# Patient Record
Sex: Female | Born: 1935 | Race: White | Hispanic: No | Smoking: Never smoker
Health system: Southern US, Community
[De-identification: ages and names within clinical notes are randomized; demographics above are authoritative.]

## PROBLEM LIST (undated history)

## (undated) DIAGNOSIS — Z5189 Encounter for other specified aftercare: Secondary | ICD-10-CM

## (undated) DIAGNOSIS — I10 Essential (primary) hypertension: Secondary | ICD-10-CM

## (undated) DIAGNOSIS — E039 Hypothyroidism, unspecified: Secondary | ICD-10-CM

## (undated) DIAGNOSIS — Z9229 Personal history of other drug therapy: Secondary | ICD-10-CM

## (undated) DIAGNOSIS — M1990 Unspecified osteoarthritis, unspecified site: Secondary | ICD-10-CM

## (undated) DIAGNOSIS — T7840XA Allergy, unspecified, initial encounter: Secondary | ICD-10-CM

## (undated) DIAGNOSIS — F32A Depression, unspecified: Secondary | ICD-10-CM

## (undated) DIAGNOSIS — F419 Anxiety disorder, unspecified: Secondary | ICD-10-CM

## (undated) DIAGNOSIS — E669 Obesity, unspecified: Secondary | ICD-10-CM

## (undated) DIAGNOSIS — E785 Hyperlipidemia, unspecified: Secondary | ICD-10-CM

## (undated) DIAGNOSIS — K8020 Calculus of gallbladder without cholecystitis without obstruction: Secondary | ICD-10-CM

## (undated) DIAGNOSIS — H269 Unspecified cataract: Secondary | ICD-10-CM

## (undated) DIAGNOSIS — I4891 Unspecified atrial fibrillation: Secondary | ICD-10-CM

## (undated) DIAGNOSIS — M8580 Other specified disorders of bone density and structure, unspecified site: Secondary | ICD-10-CM

## (undated) DIAGNOSIS — K5900 Constipation, unspecified: Secondary | ICD-10-CM

## (undated) DIAGNOSIS — C3490 Malignant neoplasm of unspecified part of unspecified bronchus or lung: Secondary | ICD-10-CM

## (undated) DIAGNOSIS — F329 Major depressive disorder, single episode, unspecified: Secondary | ICD-10-CM

## (undated) DIAGNOSIS — Z7901 Long term (current) use of anticoagulants: Secondary | ICD-10-CM

## (undated) HISTORY — DX: Hyperlipidemia, unspecified: E78.5

## (undated) HISTORY — DX: Allergy, unspecified, initial encounter: T78.40XA

## (undated) HISTORY — DX: Malignant neoplasm of unspecified part of unspecified bronchus or lung: C34.90

## (undated) HISTORY — DX: Hypothyroidism, unspecified: E03.9

## (undated) HISTORY — DX: Constipation, unspecified: K59.00

## (undated) HISTORY — DX: Obesity, unspecified: E66.9

## (undated) HISTORY — PX: COLONOSCOPY: SHX174

## (undated) HISTORY — DX: Major depressive disorder, single episode, unspecified: F32.9

## (undated) HISTORY — DX: Personal history of other drug therapy: Z92.29

## (undated) HISTORY — DX: Long term (current) use of anticoagulants: Z79.01

## (undated) HISTORY — PX: TUBAL LIGATION: SHX77

## (undated) HISTORY — DX: Essential (primary) hypertension: I10

## (undated) HISTORY — DX: Anxiety disorder, unspecified: F41.9

## (undated) HISTORY — DX: Depression, unspecified: F32.A

## (undated) HISTORY — DX: Unspecified cataract: H26.9

## (undated) HISTORY — DX: Unspecified osteoarthritis, unspecified site: M19.90

## (undated) HISTORY — PX: THYROIDECTOMY: SHX17

## (undated) HISTORY — DX: Encounter for other specified aftercare: Z51.89

## (undated) HISTORY — PX: CATARACT EXTRACTION: SUR2

## (undated) HISTORY — PX: UPPER GASTROINTESTINAL ENDOSCOPY: SHX188

## (undated) HISTORY — DX: Other specified disorders of bone density and structure, unspecified site: M85.80

## (undated) HISTORY — DX: Calculus of gallbladder without cholecystitis without obstruction: K80.20

## (undated) HISTORY — DX: Unspecified atrial fibrillation: I48.91

---

## 1996-02-18 HISTORY — PX: LUNG REMOVAL, PARTIAL: SHX233

## 1997-08-30 ENCOUNTER — Ambulatory Visit (HOSPITAL_COMMUNITY): Admission: RE | Admit: 1997-08-30 | Discharge: 1997-08-30 | Payer: Self-pay | Admitting: *Deleted

## 1997-10-17 ENCOUNTER — Encounter: Payer: Self-pay | Admitting: *Deleted

## 1997-10-18 ENCOUNTER — Encounter: Payer: Self-pay | Admitting: *Deleted

## 1997-10-18 ENCOUNTER — Inpatient Hospital Stay: Admission: RE | Admit: 1997-10-18 | Discharge: 1997-10-23 | Payer: Self-pay | Admitting: Thoracic Surgery

## 1997-10-21 ENCOUNTER — Encounter: Payer: Self-pay | Admitting: Thoracic Surgery

## 1997-10-22 ENCOUNTER — Encounter: Payer: Self-pay | Admitting: Thoracic Surgery

## 1997-10-23 ENCOUNTER — Encounter: Payer: Self-pay | Admitting: Vascular Surgery

## 1998-12-03 ENCOUNTER — Encounter: Admission: RE | Admit: 1998-12-03 | Discharge: 1998-12-03 | Payer: Self-pay | Admitting: Thoracic Surgery

## 1999-01-24 ENCOUNTER — Other Ambulatory Visit: Admission: RE | Admit: 1999-01-24 | Discharge: 1999-01-24 | Payer: Self-pay | Admitting: Family Medicine

## 1999-04-10 ENCOUNTER — Encounter: Payer: Self-pay | Admitting: Thoracic Surgery

## 1999-04-10 ENCOUNTER — Encounter: Admission: RE | Admit: 1999-04-10 | Discharge: 1999-04-10 | Payer: Self-pay | Admitting: Thoracic Surgery

## 1999-10-08 ENCOUNTER — Encounter: Admission: RE | Admit: 1999-10-08 | Discharge: 1999-10-08 | Payer: Self-pay | Admitting: Thoracic Surgery

## 1999-10-08 ENCOUNTER — Encounter: Payer: Self-pay | Admitting: Thoracic Surgery

## 2000-01-03 ENCOUNTER — Ambulatory Visit (HOSPITAL_COMMUNITY): Admission: RE | Admit: 2000-01-03 | Discharge: 2000-01-03 | Payer: Self-pay | Admitting: Thoracic Surgery

## 2000-01-03 ENCOUNTER — Encounter: Payer: Self-pay | Admitting: Thoracic Surgery

## 2000-05-01 ENCOUNTER — Ambulatory Visit (HOSPITAL_COMMUNITY): Admission: RE | Admit: 2000-05-01 | Discharge: 2000-05-01 | Payer: Self-pay | Admitting: Thoracic Surgery

## 2000-05-05 ENCOUNTER — Encounter: Payer: Self-pay | Admitting: Thoracic Surgery

## 2000-05-05 ENCOUNTER — Ambulatory Visit (HOSPITAL_COMMUNITY): Admission: RE | Admit: 2000-05-05 | Discharge: 2000-05-05 | Payer: Self-pay | Admitting: Thoracic Surgery

## 2000-11-11 ENCOUNTER — Ambulatory Visit (HOSPITAL_COMMUNITY): Admission: RE | Admit: 2000-11-11 | Discharge: 2000-11-11 | Payer: Self-pay | Admitting: Thoracic Surgery

## 2000-11-11 ENCOUNTER — Encounter: Payer: Self-pay | Admitting: Thoracic Surgery

## 2001-05-12 ENCOUNTER — Encounter: Payer: Self-pay | Admitting: Thoracic Surgery

## 2001-05-12 ENCOUNTER — Ambulatory Visit (HOSPITAL_COMMUNITY): Admission: RE | Admit: 2001-05-12 | Discharge: 2001-05-12 | Payer: Self-pay | Admitting: Thoracic Surgery

## 2001-11-10 ENCOUNTER — Encounter: Admission: RE | Admit: 2001-11-10 | Discharge: 2001-11-10 | Payer: Self-pay | Admitting: Thoracic Surgery

## 2001-11-10 ENCOUNTER — Encounter: Payer: Self-pay | Admitting: Thoracic Surgery

## 2002-08-09 ENCOUNTER — Other Ambulatory Visit: Admission: RE | Admit: 2002-08-09 | Discharge: 2002-08-09 | Payer: Self-pay | Admitting: Family Medicine

## 2002-11-15 ENCOUNTER — Encounter: Payer: Self-pay | Admitting: Thoracic Surgery

## 2002-11-15 ENCOUNTER — Encounter: Admission: RE | Admit: 2002-11-15 | Discharge: 2002-11-15 | Payer: Self-pay | Admitting: Thoracic Surgery

## 2003-11-09 ENCOUNTER — Other Ambulatory Visit: Admission: RE | Admit: 2003-11-09 | Discharge: 2003-11-09 | Payer: Self-pay | Admitting: Family Medicine

## 2003-11-23 ENCOUNTER — Encounter: Admission: RE | Admit: 2003-11-23 | Discharge: 2003-11-23 | Payer: Self-pay | Admitting: Family Medicine

## 2004-11-20 ENCOUNTER — Other Ambulatory Visit: Admission: RE | Admit: 2004-11-20 | Discharge: 2004-11-20 | Payer: Self-pay | Admitting: Family Medicine

## 2005-12-02 ENCOUNTER — Other Ambulatory Visit: Admission: RE | Admit: 2005-12-02 | Discharge: 2005-12-02 | Payer: Self-pay | Admitting: Family Medicine

## 2008-01-11 ENCOUNTER — Ambulatory Visit: Payer: Self-pay | Admitting: Gastroenterology

## 2008-01-18 LAB — HM COLONOSCOPY

## 2008-01-25 ENCOUNTER — Encounter: Payer: Self-pay | Admitting: Gastroenterology

## 2008-01-25 ENCOUNTER — Ambulatory Visit: Payer: Self-pay | Admitting: Gastroenterology

## 2008-01-27 ENCOUNTER — Encounter: Payer: Self-pay | Admitting: Gastroenterology

## 2009-03-14 LAB — HM DEXA SCAN

## 2010-01-22 LAB — HM PAP SMEAR: HM Pap smear: NEGATIVE

## 2010-05-01 LAB — HM DIABETES FOOT EXAM: HM Diabetic Foot Exam: NORMAL

## 2010-05-29 LAB — FECAL OCCULT BLOOD, GUAIAC: Fecal Occult Blood: NEGATIVE

## 2010-06-05 ENCOUNTER — Ambulatory Visit (INDEPENDENT_AMBULATORY_CARE_PROVIDER_SITE_OTHER): Payer: Medicare Other | Admitting: Cardiology

## 2010-06-05 ENCOUNTER — Encounter: Payer: Self-pay | Admitting: Cardiology

## 2010-06-05 VITALS — BP 148/79 | HR 63 | Ht 63.0 in | Wt 203.0 lb

## 2010-06-05 DIAGNOSIS — E669 Obesity, unspecified: Secondary | ICD-10-CM

## 2010-06-05 DIAGNOSIS — E119 Type 2 diabetes mellitus without complications: Secondary | ICD-10-CM

## 2010-06-05 DIAGNOSIS — Z6834 Body mass index (BMI) 34.0-34.9, adult: Secondary | ICD-10-CM | POA: Insufficient documentation

## 2010-06-05 DIAGNOSIS — R9431 Abnormal electrocardiogram [ECG] [EKG]: Secondary | ICD-10-CM

## 2010-06-05 DIAGNOSIS — I10 Essential (primary) hypertension: Secondary | ICD-10-CM | POA: Insufficient documentation

## 2010-06-05 DIAGNOSIS — E785 Hyperlipidemia, unspecified: Secondary | ICD-10-CM | POA: Insufficient documentation

## 2010-06-05 NOTE — Patient Instructions (Addendum)
You need to have a myoview stress test.  Please call our office once your have spoken with your daughter to schedule. You will called with the results You will need to follow as needed depending on the results

## 2010-06-05 NOTE — Assessment & Plan Note (Signed)
I reviewed her last lipid profile with an LDL in the 120s. This is certainly not cardiac but she has been started on treatment and I will defer her to her primary provider.

## 2010-06-05 NOTE — Progress Notes (Signed)
HPI The patient presents for evaluation of an abnormal EKG she was found to have some anterior T-wave inversions recently. This was new compared to previous. She has not had cardiovascular symptoms. She is not overly active. She will rarely get on a treadmill and she has mentioned some mild chest discomfort with this. She has not had any new shortness of breath, PND or. She has not had any new palpitations, presyncope or syncope. She says and chronic mild lower extremity swelling. She has not had any testing done in over 10 years when she apparently had a stress perfusion study.   Allergies  Allergen Reactions  . Piroxicam     REACTION: RASH TO HANDS    Current Outpatient Prescriptions  Medication Sig Dispense Refill  . amLODipine (NORVASC) 5 MG tablet Take 5 mg by mouth daily.        Marland Kitchen aspirin EC 81 MG EC tablet Take 81 mg by mouth daily.        Marland Kitchen BLACK COHOSH PO Take by mouth.        . ergocalciferol (VITAMIN D2) 50000 UNITS capsule Take 50,000 Units by mouth once a week.        . fish oil-omega-3 fatty acids 1000 MG capsule Take 2 g by mouth daily.        . furosemide (LASIX) 20 MG tablet Take 20 mg by mouth daily.        Marland Kitchen levothyroxine (SYNTHROID, LEVOTHROID) 137 MCG tablet Take 137 mcg by mouth daily.        . metFORMIN (GLUCOPHAGE) 500 MG tablet Take 500 mg by mouth 2 (two) times daily with a meal.        . metoprolol (TOPROL-XL) 200 MG 24 hr tablet 200 mg. Take 1 1/2 tab daily       . olmesartan (BENICAR) 40 MG tablet Take 40 mg by mouth daily.        . rosuvastatin (CRESTOR) 20 MG tablet Take 20 mg by mouth daily.        Marland Kitchen DISCONTD: bisacodyl (DULCOLAX) 5 MG EC tablet Take 5 mg by mouth daily as needed.        Marland Kitchen DISCONTD: metoCLOPramide (REGLAN) 10 MG tablet Take 10 mg by mouth 4 (four) times daily.        Marland Kitchen DISCONTD: polyethylene glycol (MIRALAX / GLYCOLAX) packet Take 17 g by mouth daily.          Past Medical History  Diagnosis Date  . HTN (hypertension)   . Diabetes  mellitus   . Abnormal EKG   . Dyslipidemia   . Obesity   . Lung cancer   . Hypothyroidism     Past Surgical History  Procedure Date  . Thyroidectomy   . Lung removal, partial     Right    Family History  Problem Relation Age of Onset  . Coronary artery disease Brother 93    History   Social History  . Marital Status: Widowed    Spouse Name: N/A    Number of Children: N/A  . Years of Education: N/A   Occupational History  . Not on file.   Social History Main Topics  . Smoking status: Never Smoker   . Smokeless tobacco: Not on file  . Alcohol Use: Not on file  . Drug Use: Not on file  . Sexually Active: Not on file   Other Topics Concern  . Not on file   Social History Narrative  . No narrative  on file   ROS: As stated in the HPI and negative for all other systems.  PHYSICAL EXAM BP 148/79  Pulse 63  Ht 5\' 3"  (1.6 m)  Wt 203 lb (92.08 kg)  BMI 35.96 kg/m2 GENERAL:  Well appearing HEENT:  Pupils equal round and reactive, fundi not visualized, oral mucosa unremarkable NECK:  No jugular venous distention, waveform within normal limits, carotid upstroke brisk and symmetric, no bruits, no thyromegaly LYMPHATICS:  No cervical, inguinal adenopathy LUNGS:  Clear to auscultation bilaterally BACK:  No CVA tenderness CHEST:  Unremarkable HEART:  PMI not displaced or sustained,S1 and S2 within normal limits, no S3, no S4, no clicks, no rubs, no murmurs ABD:  Flat, positive bowel sounds normal in frequency in pitch, no bruits, no rebound, no guarding, no midline pulsatile mass, no hepatomegaly, no splenomegaly, obese EXT:  2 plus pulses throughout, no edema, no cyanosis no clubbing SKIN:  No rashes no nodules NEURO:  Cranial nerves II through XII grossly intact, motor grossly intact throughout PSYCH:  Cognitively intact, oriented to person place and time  EKG:  Sinus rhythm, rate 70, premature atrial contractions, possible old anteroseptal infarct, anterior T-wave  inversion minus ischemia  ASSESSMENT AND PLAN

## 2010-06-05 NOTE — Assessment & Plan Note (Signed)
Her blood pressure is controlled. She will continue the meds as listed.

## 2010-06-05 NOTE — Assessment & Plan Note (Signed)
I do think the patient has high risk for cardiovascular disease given her abnormal EKG and risk factors. He had a stress perfusion imaging is indicated and I will arrange this. I believe she can walk on a treadmill.

## 2010-06-05 NOTE — Assessment & Plan Note (Signed)
The patient understands the need to lose weight with diet and exercise. We have discussed specific strategies for this.  

## 2010-06-06 LAB — HM MAMMOGRAPHY: HM Mammogram: NORMAL

## 2010-06-10 ENCOUNTER — Encounter: Payer: Self-pay | Admitting: Family Medicine

## 2010-07-01 ENCOUNTER — Ambulatory Visit (HOSPITAL_COMMUNITY): Payer: Medicare Other | Attending: Cardiology | Admitting: Radiology

## 2010-07-01 DIAGNOSIS — R0789 Other chest pain: Secondary | ICD-10-CM

## 2010-07-01 DIAGNOSIS — R0602 Shortness of breath: Secondary | ICD-10-CM

## 2010-07-01 DIAGNOSIS — R079 Chest pain, unspecified: Secondary | ICD-10-CM | POA: Insufficient documentation

## 2010-07-01 MED ORDER — TECHNETIUM TC 99M TETROFOSMIN IV KIT
11.0000 | PACK | Freq: Once | INTRAVENOUS | Status: AC | PRN
  Administered 2010-07-01: 11 via INTRAVENOUS

## 2010-07-01 MED ORDER — TECHNETIUM TC 99M TETROFOSMIN IV KIT
33.0000 | PACK | Freq: Once | INTRAVENOUS | Status: AC | PRN
  Administered 2010-07-01: 33 via INTRAVENOUS

## 2010-07-01 NOTE — Progress Notes (Addendum)
[facility] SITE 3 NUCLEAR MED [address] [place] [address] [phone]  Cardiology Nuclear Med Study  Veronica Paul is a 75 y.o. female [id number] [birth year]   Nuclear Med Background Indication for Stress Test:  Evaluation for Ischemia and Abnormal EKG History: >10 yrs Myocardial Perfusion Study Cardiac Risk Factors: Family History - CAD, Hypertension, Lipids and NIDDM  Symptoms:  Chest Pain, Chest Tightness (last date of chest discomfort ), DOE, Fatigue, Palpitations and SOB   Nuclear Pre-Procedure Caffeine/Decaff Intake:  None NPO After: 8:00pm   Lungs:  clear IV 0.9% NS with Angio Cath:  20g  IV Site: R Antecubital  IV Started by:  Stanton Kidney, EMT-P  Chest Size (in):  40 Cup Size: C  Height: 5\' 4"  (1.626 m)  Weight:  200 lb (90.719 kg)  BMI:  Body mass index is 34.33 kg/(m^2). Tech Comments:  Toprol held > 24 hours, per patient.    Nuclear Med Study 1 or 2 day study: 1 day  Stress Test Type:  Eugenie Birks  Reading MD: Dietrich Pates, MD  Order Authorizing Provider:  J.Hochrein  Resting Radionuclide: Technetium 72m Tetrofosmin  Resting Radionuclide Dose: 11.0 mCi   Stress Radionuclide:  Technetium 28m Tetrofosmin  Stress Radionuclide Dose: 33.0 mCi           Stress Protocol Rest HR: 69 Stress HR: 133  Rest BP: 126/67 Stress BP: 220/100  Exercise Time (min): 2:59 METS: 4.6   Predicted Max HR: 148 bpm % Max HR: 89.86 bpm Rate Pressure Product: 21308   Dose of Adenosine (mg):  n/a Dose of Lexiscan: n/a mg  Dose of Atropine (mg): n/a Dose of Dobutamine: n/a mcg/kg/min (at max HR)  Stress Test Technologist: Milana Na, EMT-P  Nuclear Technologist:  Domenic Polite, CNMT     Rest Procedure:  Myocardial perfusion imaging was performed at rest 45 minutes following the intravenous administration of Technetium 23m Tetrofosmin. Rest ECG: NSR  Stress Procedure:  The patient exercised for 2:59.  The patient stopped due to sob  and denied any  chest pain.  There were no significant ST-T wave changes and occ pvcs/pacs.  Technetium 55m Tetrofosmin was injected at peak exercise and myocardial perfusion imaging was performed after a brief delay. Stress ECG: No significant ST segment change suggestive of ischemia.  QPS Raw Data Images:  Normal; no motion artifact; normal heart/lung ratio. Stress Images:  Normal homogeneous uptake in all areas of the myocardium. Rest Images:  Normal homogeneous uptake in all areas of the myocardium. Subtraction (SDS):  No evidence of ischemia. Transient Ischemic Dilatation (Normal <1.22):  0.87 Lung/Heart Ratio (Normal <0.45):  0.29  Quantitative Gated Spect Images QGS EDV:  64 ml QGS ESV:  14 ml QGS cine images:  NL LV Function; NL Wall Motion QGS EF: 77%  Impression Exercise Capacity:  Poor exercise capacity. BP Response:  Hypertensive blood pressure response. Clinical Symptoms:  No chest pain. ECG Impression:  No significant ST segment change suggestive of ischemia. Comparison with Prior Nuclear Study: No old to compare.  Overall Impression:  Normal perfusion.  No ischemia or scar.  No evidence of ischemia or infarct.   No further workup.  Continue with risk reduction.  Rollene Rotunda

## 2010-07-02 NOTE — Progress Notes (Signed)
NUCLEAR STUDY ROUTED TO DR.HOCHREIN.

## 2010-07-08 NOTE — Progress Notes (Signed)
Pt aware of results and denied questions

## 2010-11-22 LAB — GLUCOSE, CAPILLARY
Glucose-Capillary: 110 mg/dL — ABNORMAL HIGH (ref 70–99)
Glucose-Capillary: 125 mg/dL — ABNORMAL HIGH (ref 70–99)

## 2011-03-11 DIAGNOSIS — R059 Cough, unspecified: Secondary | ICD-10-CM | POA: Diagnosis not present

## 2011-03-11 DIAGNOSIS — J029 Acute pharyngitis, unspecified: Secondary | ICD-10-CM | POA: Diagnosis not present

## 2011-03-11 DIAGNOSIS — J019 Acute sinusitis, unspecified: Secondary | ICD-10-CM | POA: Diagnosis not present

## 2011-03-11 DIAGNOSIS — R05 Cough: Secondary | ICD-10-CM | POA: Diagnosis not present

## 2011-03-11 DIAGNOSIS — R070 Pain in throat: Secondary | ICD-10-CM | POA: Diagnosis not present

## 2011-03-11 DIAGNOSIS — J020 Streptococcal pharyngitis: Secondary | ICD-10-CM | POA: Diagnosis not present

## 2011-05-05 DIAGNOSIS — R609 Edema, unspecified: Secondary | ICD-10-CM | POA: Diagnosis not present

## 2011-05-05 DIAGNOSIS — E785 Hyperlipidemia, unspecified: Secondary | ICD-10-CM | POA: Diagnosis not present

## 2011-05-05 DIAGNOSIS — E119 Type 2 diabetes mellitus without complications: Secondary | ICD-10-CM | POA: Diagnosis not present

## 2011-05-05 DIAGNOSIS — I10 Essential (primary) hypertension: Secondary | ICD-10-CM | POA: Diagnosis not present

## 2011-05-05 DIAGNOSIS — E559 Vitamin D deficiency, unspecified: Secondary | ICD-10-CM | POA: Diagnosis not present

## 2011-06-12 DIAGNOSIS — Z1231 Encounter for screening mammogram for malignant neoplasm of breast: Secondary | ICD-10-CM | POA: Diagnosis not present

## 2011-06-12 LAB — HM MAMMOGRAPHY: HM Mammogram: NORMAL

## 2011-06-18 DIAGNOSIS — M899 Disorder of bone, unspecified: Secondary | ICD-10-CM | POA: Diagnosis not present

## 2011-06-18 DIAGNOSIS — M949 Disorder of cartilage, unspecified: Secondary | ICD-10-CM | POA: Diagnosis not present

## 2011-06-18 LAB — HM DEXA SCAN

## 2011-08-08 DIAGNOSIS — E785 Hyperlipidemia, unspecified: Secondary | ICD-10-CM | POA: Diagnosis not present

## 2011-08-08 DIAGNOSIS — E559 Vitamin D deficiency, unspecified: Secondary | ICD-10-CM | POA: Diagnosis not present

## 2011-08-08 DIAGNOSIS — I10 Essential (primary) hypertension: Secondary | ICD-10-CM | POA: Diagnosis not present

## 2011-08-08 DIAGNOSIS — E119 Type 2 diabetes mellitus without complications: Secondary | ICD-10-CM | POA: Diagnosis not present

## 2011-08-10 LAB — HM DIABETES FOOT EXAM: HM Diabetic Foot Exam: NORMAL

## 2011-08-13 DIAGNOSIS — Z1212 Encounter for screening for malignant neoplasm of rectum: Secondary | ICD-10-CM | POA: Diagnosis not present

## 2011-08-13 DIAGNOSIS — Z0000 Encounter for general adult medical examination without abnormal findings: Secondary | ICD-10-CM | POA: Diagnosis not present

## 2011-08-14 LAB — FECAL OCCULT BLOOD, GUAIAC: Fecal Occult Blood: NEGATIVE

## 2011-10-23 DIAGNOSIS — E119 Type 2 diabetes mellitus without complications: Secondary | ICD-10-CM | POA: Diagnosis not present

## 2011-10-23 DIAGNOSIS — H40039 Anatomical narrow angle, unspecified eye: Secondary | ICD-10-CM | POA: Diagnosis not present

## 2011-10-23 LAB — HM DIABETES EYE EXAM

## 2011-11-07 DIAGNOSIS — E785 Hyperlipidemia, unspecified: Secondary | ICD-10-CM | POA: Diagnosis not present

## 2011-11-07 DIAGNOSIS — E119 Type 2 diabetes mellitus without complications: Secondary | ICD-10-CM | POA: Diagnosis not present

## 2011-11-07 DIAGNOSIS — E039 Hypothyroidism, unspecified: Secondary | ICD-10-CM | POA: Diagnosis not present

## 2011-11-07 DIAGNOSIS — Z23 Encounter for immunization: Secondary | ICD-10-CM | POA: Diagnosis not present

## 2011-11-07 DIAGNOSIS — R609 Edema, unspecified: Secondary | ICD-10-CM | POA: Diagnosis not present

## 2011-11-07 DIAGNOSIS — I10 Essential (primary) hypertension: Secondary | ICD-10-CM | POA: Diagnosis not present

## 2012-02-09 DIAGNOSIS — E785 Hyperlipidemia, unspecified: Secondary | ICD-10-CM | POA: Diagnosis not present

## 2012-02-09 DIAGNOSIS — E039 Hypothyroidism, unspecified: Secondary | ICD-10-CM | POA: Diagnosis not present

## 2012-02-09 DIAGNOSIS — E119 Type 2 diabetes mellitus without complications: Secondary | ICD-10-CM | POA: Diagnosis not present

## 2012-02-09 DIAGNOSIS — R609 Edema, unspecified: Secondary | ICD-10-CM | POA: Diagnosis not present

## 2012-02-09 DIAGNOSIS — I10 Essential (primary) hypertension: Secondary | ICD-10-CM | POA: Diagnosis not present

## 2012-02-09 LAB — HEMOGLOBIN A1C: Hgb A1c MFr Bld: 5.5 % (ref 4.0–6.0)

## 2012-02-10 LAB — HEPATIC FUNCTION PANEL
ALT: 21 U/L (ref 7–35)
AST: 21 U/L (ref 13–35)
Alkaline Phosphatase: 58 U/L (ref 25–125)

## 2012-02-10 LAB — LIPID PANEL
Cholesterol: 166 mg/dL (ref 0–200)
HDL: 73 mg/dL — AB (ref 35–70)
LDL Cholesterol: 64 mg/dL
Triglycerides: 146 mg/dL (ref 40–160)

## 2012-02-10 LAB — BASIC METABOLIC PANEL
BUN: 9 mg/dL (ref 4–21)
Creatinine: 0.9 mg/dL (ref 0.5–1.1)
Glucose: 118 mg/dL
Potassium: 3.5 mmol/L (ref 3.4–5.3)
Sodium: 142 mmol/L (ref 137–147)

## 2012-04-24 ENCOUNTER — Encounter: Payer: Self-pay | Admitting: *Deleted

## 2012-04-24 DIAGNOSIS — M8588 Other specified disorders of bone density and structure, other site: Secondary | ICD-10-CM | POA: Insufficient documentation

## 2012-04-24 DIAGNOSIS — M8580 Other specified disorders of bone density and structure, unspecified site: Secondary | ICD-10-CM | POA: Insufficient documentation

## 2012-04-24 LAB — HM DEXA SCAN

## 2012-05-07 ENCOUNTER — Ambulatory Visit: Payer: Medicare Other

## 2012-05-07 ENCOUNTER — Ambulatory Visit (INDEPENDENT_AMBULATORY_CARE_PROVIDER_SITE_OTHER): Payer: Medicare Other | Admitting: Nurse Practitioner

## 2012-05-07 VITALS — BP 138/62 | HR 68 | Temp 98.6°F | Wt 202.0 lb

## 2012-05-07 DIAGNOSIS — M79609 Pain in unspecified limb: Secondary | ICD-10-CM

## 2012-05-07 DIAGNOSIS — M79672 Pain in left foot: Secondary | ICD-10-CM

## 2012-05-07 DIAGNOSIS — E039 Hypothyroidism, unspecified: Secondary | ICD-10-CM

## 2012-05-07 DIAGNOSIS — E785 Hyperlipidemia, unspecified: Secondary | ICD-10-CM

## 2012-05-07 LAB — COMPLETE METABOLIC PANEL WITH GFR
ALT: 13 U/L (ref 0–35)
AST: 12 U/L (ref 0–37)
Albumin: 4.5 g/dL (ref 3.5–5.2)
Alkaline Phosphatase: 57 U/L (ref 39–117)
BUN: 19 mg/dL (ref 6–23)
CO2: 28 mEq/L (ref 19–32)
Calcium: 9.5 mg/dL (ref 8.4–10.5)
Chloride: 101 mEq/L (ref 96–112)
Creat: 0.79 mg/dL (ref 0.50–1.10)
GFR, Est African American: 85 mL/min
GFR, Est Non African American: 74 mL/min
Glucose, Bld: 106 mg/dL — ABNORMAL HIGH (ref 70–99)
Potassium: 4.6 mEq/L (ref 3.5–5.3)
Sodium: 138 mEq/L (ref 135–145)
Total Bilirubin: 0.4 mg/dL (ref 0.3–1.2)
Total Protein: 6.9 g/dL (ref 6.0–8.3)

## 2012-05-07 LAB — THYROID PANEL WITH TSH
Free Thyroxine Index: 4.8 — ABNORMAL HIGH (ref 1.0–3.9)
T3 Uptake: 35.7 % (ref 22.5–37.0)
T4, Total: 13.5 ug/dL — ABNORMAL HIGH (ref 5.0–12.5)
TSH: 0.084 u[IU]/mL — ABNORMAL LOW (ref 0.350–4.500)

## 2012-05-07 MED ORDER — TRAMADOL HCL 50 MG PO TABS: 50.0000 mg | ORAL_TABLET | Freq: Three times a day (TID) | ORAL | Status: DC | PRN

## 2012-05-07 NOTE — Progress Notes (Signed)
  Subjective:    Patient ID: Veronica Paul, female    DOB: 10-Sep-1935, 77 y.o.   MRN: [id number]  HPI Patient in complaining of pain left foot. Started 2dayago. constant. Rates pain 8/10. Nothing  Helps pain. Walking  increases pain. Associated symptoms include no numbness or tingling. Patient denies injury.     Review of Systems  Constitutional: Negative.   Respiratory: Negative.   Cardiovascular: Negative.   Musculoskeletal: Positive for joint swelling.  Psychiatric/Behavioral: Negative.        Objective:   Physical Exam  Constitutional: She appears well-developed and well-nourished.  Cardiovascular: Normal rate, normal heart sounds and intact distal pulses.   Pulmonary/Chest: Effort normal and breath sounds normal.  Musculoskeletal:       Left ankle: She exhibits decreased range of motion (due to pain with any movement). She exhibits no swelling and normal pulse.  Pain on palpation medial side and dorsal surface of foot.   X-ray- Negative for fracture Preliminary reading by Paulene Floor, FNP  [facility]         Assessment & Plan:  Left Foot pain probable fasiciitis  Rest  Elevate when sitting  Ice if helps  Extra strength tylenol  Will check creatine before RX NSAID- All labs pending  Mary-Margaret Daphine Deutscher, FNP

## 2012-05-07 NOTE — Addendum Note (Signed)
Addended by: Bennie Pierini on: 05/07/2012 09:49 AM   Modules accepted: Orders

## 2012-05-07 NOTE — Patient Instructions (Signed)
Place foot pain patient instructions here.  Ice Elevate when sitting Tylenol OTC until labs are back

## 2012-05-11 ENCOUNTER — Ambulatory Visit (INDEPENDENT_AMBULATORY_CARE_PROVIDER_SITE_OTHER): Payer: Medicare Other | Admitting: Nurse Practitioner

## 2012-05-11 ENCOUNTER — Encounter: Payer: Self-pay | Admitting: Nurse Practitioner

## 2012-05-11 VITALS — BP 136/69 | HR 60 | Temp 97.1°F | Ht 63.0 in | Wt 198.0 lb

## 2012-05-11 DIAGNOSIS — Z124 Encounter for screening for malignant neoplasm of cervix: Secondary | ICD-10-CM | POA: Diagnosis not present

## 2012-05-11 DIAGNOSIS — I10 Essential (primary) hypertension: Secondary | ICD-10-CM

## 2012-05-11 DIAGNOSIS — E039 Hypothyroidism, unspecified: Secondary | ICD-10-CM

## 2012-05-11 DIAGNOSIS — E785 Hyperlipidemia, unspecified: Secondary | ICD-10-CM | POA: Diagnosis not present

## 2012-05-11 DIAGNOSIS — M109 Gout, unspecified: Secondary | ICD-10-CM | POA: Diagnosis not present

## 2012-05-11 DIAGNOSIS — Z01419 Encounter for gynecological examination (general) (routine) without abnormal findings: Secondary | ICD-10-CM | POA: Diagnosis not present

## 2012-05-11 DIAGNOSIS — E119 Type 2 diabetes mellitus without complications: Secondary | ICD-10-CM

## 2012-05-11 DIAGNOSIS — R609 Edema, unspecified: Secondary | ICD-10-CM

## 2012-05-11 LAB — POCT URINALYSIS DIPSTICK
Bilirubin, UA: NEGATIVE
Glucose, UA: NEGATIVE
Ketones, UA: NEGATIVE
Leukocytes, UA: NEGATIVE
Nitrite, UA: NEGATIVE
Protein, UA: NEGATIVE
Spec Grav, UA: <=1.005
Urobilinogen, UA: NEGATIVE
pH, UA: 7.5

## 2012-05-11 LAB — POCT UA - MICROSCOPIC ONLY
Bacteria, U Microscopic: NEGATIVE
Casts, Ur, LPF, POC: NEGATIVE
Crystals, Ur, HPF, POC: NEGATIVE
Mucus, UA: NEGATIVE
Yeast, UA: NEGATIVE

## 2012-05-11 MED ORDER — AMLODIPINE BESYLATE 5 MG PO TABS: 5.0000 mg | ORAL_TABLET | Freq: Every day | ORAL | Status: DC

## 2012-05-11 MED ORDER — METOPROLOL SUCCINATE ER 200 MG PO TB24: 200.0000 mg | ORAL_TABLET | Freq: Every day | ORAL | Status: DC

## 2012-05-11 MED ORDER — LEVOTHYROXINE SODIUM 137 MCG PO TABS: 137.0000 ug | ORAL_TABLET | Freq: Every day | ORAL | Status: DC

## 2012-05-11 MED ORDER — FUROSEMIDE 20 MG PO TABS
20.0000 mg | ORAL_TABLET | Freq: Every day | ORAL | Status: DC
Start: 2012-05-11 — End: 2012-06-25

## 2012-05-11 MED ORDER — METFORMIN HCL 500 MG PO TABS: 500.0000 mg | ORAL_TABLET | Freq: Two times a day (BID) | ORAL | Status: DC

## 2012-05-11 NOTE — Progress Notes (Signed)
Subjective:    Patient ID: Veronica Paul, female    DOB: 1935-07-17, 77 y.o.   MRN: [id number]  Gynecologic Exam The patient's pertinent negatives include no genital itching, genital lesions, genital odor, pelvic pain or vaginal bleeding. She is not pregnant. She is postmenopausal.  Hypertension This is a chronic problem. The current episode started more than 1 year ago. The problem is unchanged. The problem is controlled. Pertinent negatives include no blurred vision, chest pain, palpitations, peripheral edema or shortness of breath. There are no associated agents to hypertension. Risk factors for coronary artery disease include sedentary lifestyle, obesity, dyslipidemia and diabetes mellitus. Past treatments include calcium channel blockers (ARB). The current treatment provides significant improvement. Compliance problems include diet.   Hyperlipidemia This is a chronic problem. The current episode started more than 1 year ago. The problem is controlled. Pertinent negatives include no chest pain or shortness of breath. Current antihyperlipidemic treatment includes statins. The current treatment provides significant improvement of lipids. Compliance problems include adherence to diet and adherence to exercise.  Risk factors for coronary artery disease include diabetes mellitus, hypertension, obesity and post-menopausal.  Diabetes She presents for her follow-up diabetic visit. She has type 2 diabetes mellitus. No MedicAlert identification noted. The initial diagnosis of diabetes was made 3 years ago. Her disease course has been stable. Pertinent negatives for diabetes include no blurred vision, no chest pain, no foot paresthesias, no foot ulcerations, no visual change, no weakness and no weight loss. There are no hypoglycemic complications. Symptoms are stable. Risk factors for coronary artery disease include hypertension and dyslipidemia. Current diabetic treatment includes oral agent (monotherapy). She  is compliant with treatment all of the time. She is following a generally healthy diet. When asked about meal planning, she reported none. She has not had a previous visit with a dietician. Home blood sugar record trend: patient doesnt check BS at home. An ACE inhibitor/angiotensin II receptor blocker is being taken. She does not see a podiatrist.Eye exam is current (11/2011).  GOUT Patient had foot pain last week but Uric acid level was normal Hypothyroidism Currently on synthroid- doing well no c/o fatigue    Review of Systems  Constitutional: Negative.  Negative for weight loss.  HENT: Negative.   Eyes: Negative.  Negative for blurred vision.  Respiratory: Negative for shortness of breath.   Cardiovascular: Negative for chest pain and palpitations.  Gastrointestinal: Negative.   Endocrine: Negative.   Genitourinary: Negative.  Negative for pelvic pain.  Musculoskeletal:       Still C/O left foot pain  Skin: Negative.   Neurological: Negative.  Negative for weakness.  Hematological: Negative.   Psychiatric/Behavioral: Negative.        Objective:   Physical Exam  Constitutional: She is oriented to person, place, and time. She appears well-developed and well-nourished. No distress.  HENT:  Head: Normocephalic.  Nose: Nose normal.  Mouth/Throat: Oropharynx is clear and moist. No oropharyngeal exudate.  Eyes: Conjunctivae and EOM are normal. Pupils are equal, round, and reactive to light.  Neck: Normal range of motion. Neck supple. No JVD present. Carotid bruit is not present. No mass and no thyromegaly present.  Cardiovascular: Normal rate, regular rhythm, normal heart sounds and intact distal pulses.   Pulmonary/Chest: Effort normal and breath sounds normal.  Abdominal: Soft. Bowel sounds are normal. She exhibits no mass. There is no tenderness.  Genitourinary: Rectum normal, vagina normal and uterus normal. Rectal exam shows no mass, no tenderness and anal tone normal. Guaiac  negative stool. Right adnexum displays no mass and no tenderness. Left adnexum displays no mass and no tenderness.  Cervix parous pink no discharge.  Musculoskeletal: Normal range of motion.  Neurological: She is alert and oriented to person, place, and time. She has normal reflexes.  Skin: Skin is warm and dry.  Psychiatric: She has a normal mood and affect. Her behavior is normal. Judgment and thought content normal.   Results for orders placed in visit on 05/11/12  POCT URINALYSIS DIPSTICK      Result Value Range   Color, UA yellow     Clarity, UA clear     Glucose, UA neg     Bilirubin, UA neg     Ketones, UA neg     Spec Grav, UA <=1.005     Blood, UA trace     pH, UA 7.5     Protein, UA neg     Urobilinogen, UA negative     Nitrite, UA neg     Leukocytes, UA Negative    POCT UA - MICROSCOPIC ONLY      Result Value Range   WBC, Ur, HPF, POC 5-10     RBC, urine, microscopic 1-5     Bacteria, U Microscopic neg     Mucus, UA neg     Epithelial cells, urine per micros occ     Crystals, Ur, HPF, POC neg     Casts, Ur, LPF, POC neg     Yeast, UA neg               Assessment & Plan:  CPE 1. Encounter for routine gynecological examination - POCT urinalysis dipstick-neg - POCT UA - Microscopic Only-neg  2. Essential hypertension, benign Limit NA+ in diet encouraged to exercise - metoprolol (TOPROL-XL) 200 MG 24 hr tablet; Take 1 tablet (200 mg total) by mouth daily. Take 1 1/2 tab daily  Dispense: 30 tablet; Refill: 5 - amLODipine (NORVASC) 5 MG tablet; Take 1 tablet (5 mg total) by mouth daily.  Dispense: 30 tablet; Refill: 5  3. Hyperlipidemia Decrease fat in diet Exercise- like walking every day  4. Unspecified hypothyroidism  - levothyroxine (SYNTHROID, LEVOTHROID) 137 MCG tablet; Take 1 tablet (137 mcg total) by mouth daily.  Dispense: 30 tablet; Refill: 5  5. Gout   6. Type II or unspecified type diabetes mellitus without mention of complication, not  stated as uncontrolled Low carb diet Patient refuses to check blood sugars at home - metFORMIN (GLUCOPHAGE) 500 MG tablet; Take 1 tablet (500 mg total) by mouth 2 (two) times daily with a meal.  Dispense: 60 tablet; Refill: 5  7. Peripheral edema Prop legs when sitting - furosemide (LASIX) 20 MG tablet; Take 1 tablet (20 mg total) by mouth daily.  Dispense: 30 tablet; Refill: 5   Mary-Margaret Daphine Deutscher, FNP

## 2012-05-11 NOTE — Patient Instructions (Signed)
CPE 1. Encounter for routine gynecological examination - POCT urinalysis dipstick-neg - POCT UA - Microscopic Only-neg  2. Essential hypertension, benign Limit NA+ in diet encouraged to exercise - metoprolol (TOPROL-XL) 200 MG 24 hr tablet; Take 1 tablet (200 mg total) by mouth daily. Take 1 1/2 tab daily  Dispense: 30 tablet; Refill: 5 - amLODipine (NORVASC) 5 MG tablet; Take 1 tablet (5 mg total) by mouth daily.  Dispense: 30 tablet; Refill: 5  3. Hyperlipidemia Decrease fat in diet Exercise- like walking every day  4. Unspecified hypothyroidism  - levothyroxine (SYNTHROID, LEVOTHROID) 137 MCG tablet; Take 1 tablet (137 mcg total) by mouth daily.  Dispense: 30 tablet; Refill: 5  5. Gout   6. Type II or unspecified type diabetes mellitus without mention of complication, not stated as uncontrolled Low carb diet Patient refuses to check blood sugars at home - metFORMIN (GLUCOPHAGE) 500 MG tablet; Take 1 tablet (500 mg total) by mouth 2 (two) times daily with a meal.  Dispense: 60 tablet; Refill: 5  7. Peripheral edema Prop legs when sitting - furosemide (LASIX) 20 MG tablet; Take 1 tablet (20 mg total) by mouth daily.  Dispense: 30 tablet; Refill: 5   Mary-Margaret Daphine Deutscher, FNP

## 2012-05-12 ENCOUNTER — Other Ambulatory Visit: Payer: Self-pay | Admitting: Nurse Practitioner

## 2012-05-12 DIAGNOSIS — E039 Hypothyroidism, unspecified: Secondary | ICD-10-CM

## 2012-05-12 LAB — NMR LIPOPROFILE WITH LIPIDS
Cholesterol, Total: 100 mg/dL (ref <200)
HDL Particle Number: 31.7 umol/L (ref >=30.5)
HDL Size: 9.2 nm (ref >=9.2)
HDL-C: 40 mg/dL (ref >=40)
LDL (calc): 30 mg/dL (ref <100)
LDL Particle Number: 516 nmol/L (ref <1000)
LDL Size: 19.9 nm — ABNORMAL LOW (ref >20.5)
LP-IR Score: 47 — ABNORMAL HIGH (ref <=45)
Large HDL-P: 2.2 umol/L — ABNORMAL LOW (ref >=4.8)
Large VLDL-P: 1.8 nmol/L (ref <=2.7)
Small LDL Particle Number: 296 nmol/L (ref <=527)
Triglycerides: 150 mg/dL — ABNORMAL HIGH (ref <150)
VLDL Size: 44.9 nm (ref >=46.6)

## 2012-05-12 MED ORDER — LEVOTHYROXINE SODIUM 125 MCG PO TABS: 125.0000 ug | ORAL_TABLET | Freq: Every day | ORAL | Status: DC

## 2012-05-12 NOTE — Progress Notes (Signed)
Patient aware of labs.  

## 2012-05-13 ENCOUNTER — Telehealth: Payer: Self-pay | Admitting: Nurse Practitioner

## 2012-05-13 NOTE — Telephone Encounter (Signed)
IS IT OK FOR SAMPLES

## 2012-05-13 NOTE — Telephone Encounter (Signed)
PATIENT NEEDS BENICAR 40 SAMPLES.

## 2012-05-13 NOTE — Telephone Encounter (Signed)
YES ok for samples

## 2012-05-13 NOTE — Telephone Encounter (Signed)
Samples up front patient aware to pick up 

## 2012-05-18 ENCOUNTER — Other Ambulatory Visit: Payer: Self-pay | Admitting: Nurse Practitioner

## 2012-05-18 NOTE — Telephone Encounter (Signed)
Last B12 level was 6/13 and it was 49?

## 2012-06-03 ENCOUNTER — Telehealth: Payer: Self-pay | Admitting: Nurse Practitioner

## 2012-06-03 NOTE — Telephone Encounter (Signed)
Samples up front. Pt notified 

## 2012-06-25 ENCOUNTER — Encounter: Payer: Self-pay | Admitting: Nurse Practitioner

## 2012-06-25 ENCOUNTER — Ambulatory Visit (INDEPENDENT_AMBULATORY_CARE_PROVIDER_SITE_OTHER): Payer: Medicare Other | Admitting: Nurse Practitioner

## 2012-06-25 VITALS — BP 131/73 | HR 63 | Temp 96.6°F | Ht 63.0 in | Wt 191.0 lb

## 2012-06-25 DIAGNOSIS — R609 Edema, unspecified: Secondary | ICD-10-CM

## 2012-06-25 DIAGNOSIS — E119 Type 2 diabetes mellitus without complications: Secondary | ICD-10-CM | POA: Diagnosis not present

## 2012-06-25 DIAGNOSIS — R946 Abnormal results of thyroid function studies: Secondary | ICD-10-CM

## 2012-06-25 DIAGNOSIS — R600 Localized edema: Secondary | ICD-10-CM | POA: Insufficient documentation

## 2012-06-25 DIAGNOSIS — F329 Major depressive disorder, single episode, unspecified: Secondary | ICD-10-CM | POA: Diagnosis not present

## 2012-06-25 DIAGNOSIS — I10 Essential (primary) hypertension: Secondary | ICD-10-CM

## 2012-06-25 DIAGNOSIS — R7989 Other specified abnormal findings of blood chemistry: Secondary | ICD-10-CM

## 2012-06-25 DIAGNOSIS — F32A Depression, unspecified: Secondary | ICD-10-CM

## 2012-06-25 DIAGNOSIS — E785 Hyperlipidemia, unspecified: Secondary | ICD-10-CM

## 2012-06-25 LAB — THYROID PANEL WITH TSH
Free Thyroxine Index: 5 — ABNORMAL HIGH (ref 1.0–3.9)
T3 Uptake: 39.5 % — ABNORMAL HIGH (ref 22.5–37.0)
T4, Total: 12.6 ug/dL — ABNORMAL HIGH (ref 5.0–12.5)
TSH: 0.024 u[IU]/mL — ABNORMAL LOW (ref 0.350–4.500)

## 2012-06-25 MED ORDER — FUROSEMIDE 40 MG PO TABS: 40.0000 mg | ORAL_TABLET | Freq: Every day | ORAL | Status: DC

## 2012-06-25 NOTE — Progress Notes (Signed)
Subjective:    Patient ID: Veronica Paul, female    DOB: May 28, 1935, 77 y.o.   MRN: [id number]  Thyroid Problem Presents for follow-up visit. Symptoms include weight loss. Patient reports no anxiety, constipation, diaphoresis, dry skin, fatigue, leg swelling, palpitations or visual change. The symptoms have been stable. Her past medical history is significant for diabetes and hyperlipidemia.  Diabetes She presents for her follow-up diabetic visit. She has type 2 diabetes mellitus. No MedicAlert identification noted. The initial diagnosis of diabetes was made 3 years ago. Her disease course has been stable. There are no hypoglycemic associated symptoms. Pertinent negatives for hypoglycemia include no headaches. Associated symptoms include weight loss. Pertinent negatives for diabetes include no blurred vision, no chest pain, no fatigue, no foot paresthesias, no polydipsia, no polyphagia, no polyuria and no visual change. There are no hypoglycemic complications. Symptoms are stable. There are no diabetic complications. Risk factors for coronary artery disease include dyslipidemia, obesity, post-menopausal and hypertension. When asked about current treatments, none were reported. She is compliant with treatment all of the time. Her weight is stable. She is following a diabetic diet. When asked about meal planning, she reported none. She has not had a previous visit with a dietician. She rarely participates in exercise. There is no change in her home blood glucose trend. (Patient doesn't check blood sugars at home.) An ACE inhibitor/angiotensin II receptor blocker is not being taken. She does not see a podiatrist.Eye exam is current (October 2013).  Hypertension This is a chronic problem. The current episode started more than 1 year ago. The problem is unchanged. The problem is controlled. Associated symptoms include shortness of breath. Pertinent negatives include no blurred vision, chest pain, headaches,  malaise/fatigue, orthopnea, palpitations or peripheral edema. Agents associated with hypertension include thyroid hormones. Risk factors for coronary artery disease include obesity, post-menopausal state, dyslipidemia and diabetes mellitus. Past treatments include calcium channel blockers, diuretics, beta blockers and angiotensin blockers. The current treatment provides significant improvement. Compliance problems include exercise and diet.  Hypertensive end-organ damage includes a thyroid problem.  Hyperlipidemia The current episode started more than 1 year ago. The problem is controlled. Recent lipid tests were reviewed and are normal. Exacerbating diseases include diabetes and obesity. Associated symptoms include shortness of breath. Pertinent negatives include no chest pain, focal sensory loss, leg pain or myalgias. Current antihyperlipidemic treatment includes statins. The current treatment provides significant improvement of lipids. Compliance problems include adherence to exercise and adherence to diet.  Risk factors for coronary artery disease include diabetes mellitus, hypertension, obesity and post-menopausal.      Review of Systems  Constitutional: Positive for weight loss. Negative for malaise/fatigue, diaphoresis and fatigue.  Eyes: Negative for blurred vision.  Respiratory: Positive for shortness of breath.   Cardiovascular: Negative for chest pain, palpitations and orthopnea.  Gastrointestinal: Negative for constipation.  Endocrine: Negative for polydipsia, polyphagia and polyuria.  Musculoskeletal: Negative for myalgias.  Neurological: Negative for headaches.  All other systems reviewed and are negative.       Objective:   Physical Exam  Constitutional: She is oriented to person, place, and time. She appears well-developed and well-nourished.  HENT:  Nose: Nose normal.  Mouth/Throat: Oropharynx is clear and moist.  Eyes: EOM are normal.  Neck: Trachea normal, normal range  of motion and full passive range of motion without pain. Neck supple. No JVD present. Carotid bruit is not present. No thyromegaly present.  Cardiovascular: Normal rate, regular rhythm, normal heart sounds and intact distal pulses.  Exam reveals no gallop and no friction rub.   No murmur heard. Pulmonary/Chest: Effort normal and breath sounds normal.  Abdominal: Soft. Bowel sounds are normal. She exhibits no distension and no mass. There is no tenderness.  Musculoskeletal: Normal range of motion.  Lymphadenopathy:    She has no cervical adenopathy.  Neurological: She is alert and oriented to person, place, and time. She has normal reflexes.  Positive 4/4 monofilament bil feet.  Skin: Skin is warm and dry.  No callus formation bil feet  Psychiatric: She has a normal mood and affect. Her behavior is normal. Judgment and thought content normal.   BP 131/73  Pulse 63  Temp(Src) 96.6 F (35.9 C) (Oral)  Ht 5\' 3"  (1.6 m)  Wt 191 lb (86.637 kg)  BMI 33.84 kg/m2  LMP 05/11/1992  Results for orders placed in visit on 06/25/12  POCT GLYCOSYLATED HEMOGLOBIN (HGB A1C)      Result Value Range   Hemoglobin A1C 5.6          Assessment & Plan:  1. Diabetes mellitus, controlled Low carb diet - POCT glycosylated hemoglobin (Hb A1C)  2. Abnormal thyroid blood test - Thyroid Panel With TSH  3. Depression Stress management  4. Peripheral edema Elevate legs when sitting Increase lasix back up to 40 mg daily  5. Hypertension Low Na + diet - COMPLETE METABOLIC PANEL WITH GFR  6. Hyperlipidemia Low fat diet Daily walking - NMR Lipoprofile with Lipids  Continue all meds:    Medication List       These changes are accurate as of: 06/25/2012  9:10 AM. If you have any questions, ask your nurse or doctor.          TAKE these medications       amLODipine 5 MG tablet  Commonly known as:  NORVASC  Take 1 tablet (5 mg total) by mouth daily.     aspirin EC 81 MG tablet  Take 81 mg  by mouth daily.     BLACK COHOSH PO  Take by mouth.     citalopram 20 MG tablet  Commonly known as:  CELEXA  Take 20 mg by mouth daily.     fish oil-omega-3 fatty acids 1000 MG capsule  Take 2 g by mouth daily.     furosemide 20 MG tablet  Commonly known as:  LASIX  Take 1 tablet (20 mg total) by mouth daily.     levothyroxine 125 MCG tablet  Commonly known as:  SYNTHROID, LEVOTHROID  Take 1 tablet (125 mcg total) by mouth daily.     metFORMIN 500 MG tablet  Commonly known as:  GLUCOPHAGE  Take 1 tablet (500 mg total) by mouth 2 (two) times daily with a meal.     metoprolol 200 MG 24 hr tablet  Commonly known as:  TOPROL-XL  Take 1 tablet (200 mg total) by mouth daily. Take 1 1/2 tab daily     olmesartan 40 MG tablet  Commonly known as:  BENICAR  Take 40 mg by mouth daily.     rosuvastatin 20 MG tablet  Commonly known as:  CRESTOR  Take 20 mg by mouth daily.     traMADol 50 MG tablet  Commonly known as:  ULTRAM  Take 1 tablet (50 mg total) by mouth every 8 (eight) hours as needed for pain.     Vitamin D (Ergocalciferol) 50000 UNITS Caps  Commonly known as:  DRISDOL  TAKE ONE CAPSULE BY MOUTH WEEKLY  Vitamin D 2000 UNITS tablet  Take 2,000 Units by mouth daily.       Mary-Margaret Daphine Deutscher, FNP

## 2012-06-25 NOTE — Patient Instructions (Signed)

## 2012-07-07 DIAGNOSIS — Z1231 Encounter for screening mammogram for malignant neoplasm of breast: Secondary | ICD-10-CM | POA: Diagnosis not present

## 2012-07-27 NOTE — Progress Notes (Signed)
Addressed with pt

## 2012-07-29 ENCOUNTER — Encounter: Payer: Self-pay | Admitting: Family Medicine

## 2012-08-07 ENCOUNTER — Encounter: Payer: Self-pay | Admitting: Nurse Practitioner

## 2012-08-07 ENCOUNTER — Ambulatory Visit (INDEPENDENT_AMBULATORY_CARE_PROVIDER_SITE_OTHER): Payer: Medicare Other | Admitting: Nurse Practitioner

## 2012-08-07 VITALS — BP 113/64 | HR 72 | Temp 96.9°F | Ht 63.0 in | Wt 190.0 lb

## 2012-08-07 DIAGNOSIS — T148 Other injury of unspecified body region: Secondary | ICD-10-CM

## 2012-08-07 DIAGNOSIS — W57XXXA Bitten or stung by nonvenomous insect and other nonvenomous arthropods, initial encounter: Secondary | ICD-10-CM

## 2012-08-07 MED ORDER — DOXYCYCLINE HYCLATE 100 MG PO TABS: 100.0000 mg | ORAL_TABLET | Freq: Two times a day (BID) | ORAL | Status: DC

## 2012-08-07 NOTE — Patient Instructions (Signed)
Deer Tick Bite Deer ticks are brown arachnids (spider family) that vary in size from as small as the head of a pin to 1/4 inch (1/2 cm) diameter. They thrive in wooded areas. Deer are the preferred host of adult deer ticks. Small rodents are the host of young ticks (nymphs). When a person walks in a field or wooded area, young and adult ticks in the surrounding grass and vegetation can attach themselves to the skin. They can suck blood for hours to days if unnoticed. Ticks are found all over the [place] Some ticks carry a specific bacteria (Borrelia burgdorferi) that causes an infection called Lyme disease. The bacteria is typically passed into a person during the blood sucking process. This happens after the tick has been attached for at least a number of hours. While ticks can be found all over the [place] those carrying the bacteria that causes Lyme disease are most common in [place] and the [place]. Only a small proportion of ticks in these areas carry the Lyme disease bacteria and cause human infections. Ticks usually attach to warm spots on the body, such as the:  Head.  Back.  Neck.  Armpits.  Groin. SYMPTOMS  Most of the time, a deer tick bite will not be felt. You may or may not see the attached tick. You may notice mild irritation or redness around the bite site. If the deer tick passes the Lyme disease bacteria to a person, a round, red rash may be noticed 2 to 3 days after the bite. The rash may be clear in the middle, like a bull's-eye or target. If not treated, other symptoms may develop several days to weeks after the onset of the rash. These symptoms may include:  New rash lesions.  Fatigue and weakness.  General ill feeling and achiness.  Chills.  Headache and neck pain.  Swollen lymph glands.  Sore muscles and joints. 5 to 15% of untreated people with Lyme disease may develop more severe illnesses after several weeks to months. This may include inflammation of the  brain lining (meningitis), nerve palsies, an abnormal heartbeat, or severe muscle and joint pain and inflammation (myositis or arthritis). DIAGNOSIS   Physical exam and medical history.  Viewing the tick if it was saved for confirmation.  Blood tests (to check or confirm the presence of Lyme disease). TREATMENT  Most ticks do not carry disease. If found, an attached tick should be removed using tweezers. Tweezers should be placed under the body of the tick so it is removed by its attachment parts (pincers). If there are signs or symptoms of being sick, or Lyme disease is confirmed, medicines (antibiotics) that kill germs are usually prescribed. In more severe cases, antibiotics may be given through an intravenous (IV) access. HOME CARE INSTRUCTIONS   Always remove ticks with tweezers. Do not use petroleum jelly or other methods to kill or remove the tick. Slide the tweezers under the body and pull out as much as you can. If you are not sure what it is, save it in a jar and show your caregiver.  Once you remove the tick, the skin will heal on its own. Wash your hands and the affected area with water and soap. You may place a bandage on the affected area.  Take medicine as directed. You may be advised to take a full course of antibiotics.  Follow up with your caregiver as recommended. FINDING OUT THE RESULTS OF YOUR TEST Not all test results are available   during your visit. If your test results are not back during the visit, make an appointment with your caregiver to find out the results. Do not assume everything is normal if you have not heard from your caregiver or the medical facility. It is important for you to follow up on all of your test results. PROGNOSIS  If Lyme disease is confirmed, early treatment with antibiotics is very effective. Following preventive guidelines is important since it is possible to get the disease more than once. PREVENTION   Wear long sleeves and long pants in  wooded or grassy areas. Tuck your pants into your socks.  Use an insect repellent while hiking.  Check yourself, your children, and your pets regularly for ticks after playing outside.  Clear piles of leaves or brush from your yard. Ticks might live there. SEEK MEDICAL CARE IF:   You or your child has an oral temperature above 102 F (38.9 C).  You develop a severe headache following the bite.  You feel generally ill.  You notice a rash.  You are having trouble removing the tick.  The bite area has red skin or yellow drainage. SEEK IMMEDIATE MEDICAL CARE IF:   Your face is weak and droopy or you have other neurological symptoms.  You have severe joint pain or weakness. MAKE SURE YOU:   Understand these instructions.  Will watch your condition.  Will get help right away if you are not doing well or get worse. FOR MORE INFORMATION Centers for Disease Control and Prevention: www.cdc.gov [employer]: www.aafp.org Document Released: 04/30/2009 Document Revised: 04/28/2011 Document Reviewed: 04/30/2009 [employer] Patient Information 2014 [employer].  

## 2012-08-07 NOTE — Progress Notes (Signed)
  Subjective:    Patient ID: Veronica Paul, female    DOB: [birth year], 77 y.o.   MRN: [id number]  HPI   patioent in C/O tick bite- Pulled tick off 2 days ago- Not sure how long it was on there-Denies fever achiness or fatigue.    Review of Systems  All other systems reviewed and are negative.       Objective:   Physical Exam  Constitutional: She appears well-developed and well-nourished.  Cardiovascular: Normal rate and normal heart sounds.   Pulmonary/Chest: Effort normal and breath sounds normal.  Skin:  5cm annular erythematous hot to touch area with central punctate on right mid abdominal wall.   BP 113/64  Pulse 72  Temp(Src) 96.9 F (36.1 C) (Oral)  Ht 5\' 3"  (1.6 m)  Wt 190 lb (86.183 kg)  BMI 33.67 kg/m2  LMP 05/11/1992        Assessment & Plan:  1. Tick bite Avoid scratching or picking at area Cool compresses RTO prn - doxycycline (VIBRA-TABS) 100 MG tablet; Take 1 tablet (100 mg total) by mouth 2 (two) times daily.  Dispense: 28 tablet; Refill: 0  Mary-Margaret Daphine Deutscher, FNP

## 2012-08-11 ENCOUNTER — Other Ambulatory Visit: Payer: Self-pay | Admitting: Nurse Practitioner

## 2012-08-13 ENCOUNTER — Other Ambulatory Visit: Payer: Medicare Other

## 2012-08-13 ENCOUNTER — Ambulatory Visit: Payer: Medicare Other | Admitting: Nurse Practitioner

## 2012-09-09 ENCOUNTER — Other Ambulatory Visit: Payer: Self-pay | Admitting: Nurse Practitioner

## 2012-09-09 ENCOUNTER — Telehealth: Payer: Self-pay | Admitting: Nurse Practitioner

## 2012-09-10 NOTE — Telephone Encounter (Signed)
Patient aware samples up front  

## 2012-10-01 ENCOUNTER — Ambulatory Visit: Payer: Medicare Other | Admitting: Nurse Practitioner

## 2012-10-22 DIAGNOSIS — H40039 Anatomical narrow angle, unspecified eye: Secondary | ICD-10-CM | POA: Diagnosis not present

## 2012-10-22 DIAGNOSIS — E119 Type 2 diabetes mellitus without complications: Secondary | ICD-10-CM | POA: Diagnosis not present

## 2012-10-26 ENCOUNTER — Telehealth: Payer: Self-pay | Admitting: Nurse Practitioner

## 2012-10-26 NOTE — Telephone Encounter (Signed)
[facility] up front

## 2012-10-29 ENCOUNTER — Ambulatory Visit (INDEPENDENT_AMBULATORY_CARE_PROVIDER_SITE_OTHER): Payer: Medicare Other | Admitting: Nurse Practitioner

## 2012-10-29 VITALS — BP 122/75 | HR 64 | Temp 97.0°F | Ht 63.0 in | Wt 187.0 lb

## 2012-10-29 DIAGNOSIS — J019 Acute sinusitis, unspecified: Secondary | ICD-10-CM | POA: Diagnosis not present

## 2012-10-29 MED ORDER — AMOXICILLIN 875 MG PO TABS: 875.0000 mg | ORAL_TABLET | Freq: Two times a day (BID) | ORAL | Status: DC

## 2012-10-29 NOTE — Patient Instructions (Signed)

## 2012-10-29 NOTE — Progress Notes (Signed)
  Subjective:    Patient ID: Veronica Paul, female    DOB: Jun 13, 1935, 77 y.o.   MRN: [id number]  URI  This is a new problem. The current episode started in the past 7 days. The problem has been waxing and waning. There has been no fever. Associated symptoms include congestion, coughing (slight), rhinorrhea and a sore throat (scratchy). Pertinent negatives include no ear pain, headaches, neck pain, sinus pain, swollen glands or vomiting. She has tried acetaminophen for the symptoms. The treatment provided no relief.      Review of Systems  HENT: Positive for congestion, sore throat (scratchy) and rhinorrhea. Negative for ear pain and neck pain.   Respiratory: Positive for cough (slight).   Gastrointestinal: Negative for vomiting.  Neurological: Negative for headaches.  All other systems reviewed and are negative.       Objective:   Physical Exam  Constitutional: She appears well-developed and well-nourished.  HENT:  Right Ear: Hearing, tympanic membrane, external ear and ear canal normal.  Left Ear: Hearing, tympanic membrane, external ear and ear canal normal.  Nose: Mucosal edema and rhinorrhea present. Right sinus exhibits maxillary sinus tenderness. Right sinus exhibits no frontal sinus tenderness. Left sinus exhibits maxillary sinus tenderness. Left sinus exhibits no frontal sinus tenderness.  Mouth/Throat: Uvula is midline, oropharynx is clear and moist and mucous membranes are normal.  Cardiovascular: Normal rate, regular rhythm and normal heart sounds.   Pulmonary/Chest: Effort normal and breath sounds normal.    BP 122/75  Pulse 64  Temp(Src) 97 F (36.1 C) (Oral)  Ht 5\' 3"  (1.6 m)  Wt 187 lb (84.823 kg)  BMI 33.13 kg/m2  LMP 05/11/1992       Assessment & Plan:  1. Acute rhinosinusitis 1. Take meds as prescribed 2. Use a cool mist humidifier especially during the winter months and when heat has  been humid. 3. Use saline nose sprays frequently 4. Saline  irrigations of the nose can be very helpful if done frequently.  * 4X daily for 1 week*  * Use of a nettie pot can be helpful with this. Follow directions with this* 5. Drink plenty of fluids 6. Keep thermostat turn down low 7.For any cough or congestion  Use plain Mucinex- regular strength or max strength is fine   * Children- consult with Pharmacist for dosing 8. For fever or aces or pains- take tylenol or ibuprofen appropriate for age and weight.  * for fevers greater than 101 orally you may alternate ibuprofen and tylenol every  3 hours.   - amoxicillin (AMOXIL) 875 MG tablet; Take 1 tablet (875 mg total) by mouth 2 (two) times daily.  Dispense: 20 tablet; Refill: 0  Mary-Margaret Daphine Deutscher, FNP

## 2012-10-31 ENCOUNTER — Other Ambulatory Visit: Payer: Self-pay | Admitting: Nurse Practitioner

## 2012-11-01 NOTE — Telephone Encounter (Signed)
Last seen 08/07/12  MMM   Last glucose 06/25/12

## 2012-11-08 ENCOUNTER — Ambulatory Visit (INDEPENDENT_AMBULATORY_CARE_PROVIDER_SITE_OTHER): Payer: Medicare Other | Admitting: Nurse Practitioner

## 2012-11-08 ENCOUNTER — Encounter: Payer: Self-pay | Admitting: Nurse Practitioner

## 2012-11-08 ENCOUNTER — Ambulatory Visit (INDEPENDENT_AMBULATORY_CARE_PROVIDER_SITE_OTHER): Payer: Medicare Other

## 2012-11-08 VITALS — BP 136/72 | HR 61 | Temp 98.0°F | Ht 63.0 in | Wt 188.0 lb

## 2012-11-08 DIAGNOSIS — R0602 Shortness of breath: Secondary | ICD-10-CM

## 2012-11-08 DIAGNOSIS — I10 Essential (primary) hypertension: Secondary | ICD-10-CM

## 2012-11-08 DIAGNOSIS — R079 Chest pain, unspecified: Secondary | ICD-10-CM | POA: Diagnosis not present

## 2012-11-08 DIAGNOSIS — Z23 Encounter for immunization: Secondary | ICD-10-CM

## 2012-11-08 DIAGNOSIS — E119 Type 2 diabetes mellitus without complications: Secondary | ICD-10-CM | POA: Diagnosis not present

## 2012-11-08 DIAGNOSIS — E785 Hyperlipidemia, unspecified: Secondary | ICD-10-CM | POA: Diagnosis not present

## 2012-11-08 LAB — POCT GLYCOSYLATED HEMOGLOBIN (HGB A1C): Hemoglobin A1C: 5.5

## 2012-11-08 NOTE — Patient Instructions (Signed)
Chest Pain (Nonspecific) °It is often hard to give a specific diagnosis for the cause of chest pain. There is always a chance that your pain could be related to something serious, such as a heart attack or a blood clot in the lungs. You need to follow up with your caregiver for further evaluation. °CAUSES  °· Heartburn. °· Pneumonia or bronchitis. °· Anxiety or stress. °· Inflammation around your heart (pericarditis) or lung (pleuritis or pleurisy). °· A blood clot in the lung. °· A collapsed lung (pneumothorax). It can develop suddenly on its own (spontaneous pneumothorax) or from injury (trauma) to the chest. °· Shingles infection (herpes zoster virus). °The chest wall is composed of bones, muscles, and cartilage. Any of these can be the source of the pain. °· The bones can be bruised by injury. °· The muscles or cartilage can be strained by coughing or overwork. °· The cartilage can be affected by inflammation and become sore (costochondritis). °DIAGNOSIS  °Lab tests or other studies, such as X-rays, electrocardiography, stress testing, or cardiac imaging, may be needed to find the cause of your pain.  °TREATMENT  °· Treatment depends on what may be causing your chest pain. Treatment may include: °· Acid blockers for heartburn. °· Anti-inflammatory medicine. °· Pain medicine for inflammatory conditions. °· Antibiotics if an infection is present. °· You may be advised to change lifestyle habits. This includes stopping smoking and avoiding alcohol, caffeine, and chocolate. °· You may be advised to keep your head raised (elevated) when sleeping. This reduces the chance of acid going backward from your stomach into your esophagus. °· Most of the time, nonspecific chest pain will improve within 2 to 3 days with rest and mild pain medicine. °HOME CARE INSTRUCTIONS  °· If antibiotics were prescribed, take your antibiotics as directed. Finish them even if you start to feel better. °· For the next few days, avoid physical  activities that bring on chest pain. Continue physical activities as directed. °· Do not smoke. °· Avoid drinking alcohol. °· Only take over-the-counter or prescription medicine for pain, discomfort, or fever as directed by your caregiver. °· Follow your caregiver's suggestions for further testing if your chest pain does not go away. °· Keep any follow-up appointments you made. If you do not go to an appointment, you could develop lasting (chronic) problems with pain. If there is any problem keeping an appointment, you must call to reschedule. °SEEK MEDICAL CARE IF:  °· You think you are having problems from the medicine you are taking. Read your medicine instructions carefully. °· Your chest pain does not go away, even after treatment. °· You develop a rash with blisters on your chest. °SEEK IMMEDIATE MEDICAL CARE IF:  °· You have increased chest pain or pain that spreads to your arm, neck, jaw, back, or abdomen. °· You develop shortness of breath, an increasing cough, or you are coughing up blood. °· You have severe back or abdominal pain, feel nauseous, or vomit. °· You develop severe weakness, fainting, or chills. °· You have a fever. °THIS IS AN EMERGENCY. Do not wait to see if the pain will go away. Get medical help at once. Call your local emergency services (911 in [place] Do not drive yourself to the hospital. °MAKE SURE YOU:  °· Understand these instructions. °· Will watch your condition. °· Will get help right away if you are not doing well or get worse. °Document Released: 11/13/2004 Document Revised: 04/28/2011 Document Reviewed: 09/09/2007 °[employer]® Patient Information ©2014 [employer]   [employer]. ° °

## 2012-11-08 NOTE — Progress Notes (Addendum)
Subjective:    Patient ID: Veronica Paul, female    DOB: 14-Jan-1936, 77 y.o.   MRN: [id number]  Thyroid Problem Presents for follow-up visit. Symptoms include weight loss. Patient reports no anxiety, constipation, diaphoresis, dry skin, fatigue, leg swelling, palpitations or visual change. The symptoms have been stable. Her past medical history is significant for diabetes and hyperlipidemia.  Diabetes She presents for her follow-up diabetic visit. She has type 2 diabetes mellitus. No MedicAlert identification noted. The initial diagnosis of diabetes was made 3 years ago. Her disease course has been stable. There are no hypoglycemic associated symptoms. Pertinent negatives for hypoglycemia include no headaches. Associated symptoms include chest pain and weight loss. Pertinent negatives for diabetes include no blurred vision, no fatigue, no foot paresthesias, no polydipsia, no polyphagia, no polyuria and no visual change. There are no hypoglycemic complications. Symptoms are stable. There are no diabetic complications. Risk factors for coronary artery disease include dyslipidemia, obesity, post-menopausal and hypertension. When asked about current treatments, none were reported. She is compliant with treatment all of the time. Her weight is stable. She is following a diabetic diet. When asked about meal planning, she reported none. She has not had a previous visit with a dietician. She rarely participates in exercise. There is no change in her home blood glucose trend. (Patient doesn't check blood sugars at home.) An ACE inhibitor/angiotensin II receptor blocker is not being taken. She does not see a podiatrist.Eye exam is current (October 2013).  Hypertension This is a chronic problem. The current episode started more than 1 year ago. The problem is unchanged. The problem is controlled. Associated symptoms include chest pain and shortness of breath. Pertinent negatives include no blurred vision, headaches,  malaise/fatigue, orthopnea, palpitations or peripheral edema. Agents associated with hypertension include thyroid hormones. Risk factors for coronary artery disease include obesity, post-menopausal state, dyslipidemia and diabetes mellitus. Past treatments include calcium channel blockers, diuretics, beta blockers and angiotensin blockers. The current treatment provides significant improvement. Compliance problems include exercise and diet.  Hypertensive end-organ damage includes a thyroid problem.  Hyperlipidemia The current episode started more than 1 year ago. The problem is controlled. Recent lipid tests were reviewed and are normal. Exacerbating diseases include diabetes and obesity. Associated symptoms include chest pain and shortness of breath. Pertinent negatives include no focal sensory loss, leg pain or myalgias. Current antihyperlipidemic treatment includes statins. The current treatment provides significant improvement of lipids. Compliance problems include adherence to exercise and adherence to diet.  Risk factors for coronary artery disease include diabetes mellitus, hypertension, obesity and post-menopausal.   * patient c/o chest tightness intermittently X2 weeks- Said it feel slike something in sitting on chest- She takes an ASA which helps some- and sometimes she takes a nitroglycerin pill which helps- Some SOB- says that pain can occur when she is doing nothing and other times when she is cooking or cleaning.   Review of Systems  Constitutional: Positive for weight loss. Negative for malaise/fatigue, diaphoresis and fatigue.  Eyes: Negative for blurred vision.  Respiratory: Positive for shortness of breath.   Cardiovascular: Positive for chest pain. Negative for palpitations and orthopnea.  Gastrointestinal: Negative for constipation.  Endocrine: Negative for polydipsia, polyphagia and polyuria.  Musculoskeletal: Negative for myalgias.  Neurological: Negative for headaches.  All  other systems reviewed and are negative.       Objective:   Physical Exam  Constitutional: She is oriented to person, place, and time. She appears well-developed and well-nourished.  HENT:  Nose:  Nose normal.  Mouth/Throat: Oropharynx is clear and moist.  Eyes: EOM are normal.  Neck: Trachea normal, normal range of motion and full passive range of motion without pain. Neck supple. No JVD present. Carotid bruit is not present. No thyromegaly present.  Cardiovascular: Normal rate, regular rhythm, normal heart sounds and intact distal pulses.  Exam reveals no gallop and no friction rub.   No murmur heard. Pulmonary/Chest: Effort normal and breath sounds normal.  Abdominal: Soft. Bowel sounds are normal. She exhibits no distension and no mass. There is no tenderness.  Musculoskeletal: Normal range of motion.  Lymphadenopathy:    She has no cervical adenopathy.  Neurological: She is alert and oriented to person, place, and time. She has normal reflexes.  Positive 4/4 monofilament bil feet.  Skin: Skin is warm and dry.  No callus formation bil feet  Psychiatric: She has a normal mood and affect. Her behavior is normal. Judgment and thought content normal.   BP 136/72  Pulse 61  Temp(Src) 98 F (36.7 C) (Oral)  Ht 5\' 3"  (1.6 m)  Wt 188 lb (85.276 kg)  BMI 33.31 kg/m2  LMP 05/11/1992  Results for orders placed in visit on 11/08/12  POCT GLYCOSYLATED HEMOGLOBIN (HGB A1C)      Result Value Range   Hemoglobin A1C 5.5     EKG- NSR Chest x ray- No acute fndings-Preliminary reading by Paulene Floor, FNP  [facility]       Assessment & Plan:  1. Diabetes mellitus, controlled Low carb diet - POCT glycosylated hemoglobin (Hb A1C)  2. Abnormal thyroid blood test - Thyroid Panel With TSH  3. Depression Stress management  4. Peripheral edema Elevate legs when sitting Increase lasix back up to 40 mg daily  5. Hypertension Low Na + diet - COMPLETE METABOLIC PANEL WITH GFR  6.  Hyperlipidemia Low fat diet Daily walking - NMR Lipoprofile with Lipids  7. Chest pain- Referral to cardiologist  Continue all meds:    Medication List       This list is accurate as of: 11/08/12 10:42 AM.  Always use your most recent med list.               amLODipine 5 MG tablet  Commonly known as:  NORVASC  TAKE 1 TABLET (5 MG TOTAL) BY MOUTH DAILY.     aspirin EC 81 MG tablet  Take 81 mg by mouth daily.     BLACK COHOSH PO  Take by mouth.     fish oil-omega-3 fatty acids 1000 MG capsule  Take 2 g by mouth daily.     furosemide 40 MG tablet  Commonly known as:  LASIX  Take 1 tablet (40 mg total) by mouth daily.     levothyroxine 125 MCG tablet  Commonly known as:  SYNTHROID, LEVOTHROID  TAKE 1 TABLET (125 MCG TOTAL) BY MOUTH DAILY.     metFORMIN 500 MG tablet  Commonly known as:  GLUCOPHAGE  TAKE 1 TABLET (500 MG TOTAL)  BY MOUTH 2 (TWO) TIMES DAILY WITH AMEAL.     metoprolol 200 MG 24 hr tablet  Commonly known as:  TOPROL-XL  TAKE 1 AND 1/2 TABLETS (300MG  TOTAL) BY MOUTH ONCE A DAY OR ASINSTRUCTED BY PHYSICIAN     NITROSTAT 0.4 MG SL tablet  Generic drug:  nitroGLYCERIN     olmesartan 40 MG tablet  Commonly known as:  BENICAR  Take 40 mg by mouth daily.     rosuvastatin 20 MG tablet  Commonly known as:  CRESTOR  Take 20 mg by mouth daily.     Vitamin D (Ergocalciferol) 50000 UNITS Caps capsule  Commonly known as:  DRISDOL  TAKE ONE CAPSULE BY MOUTH WEEKLY       Mary-Margaret Daphine Deutscher, FNP

## 2012-11-09 LAB — CMP14+EGFR
ALT: 10 IU/L (ref 0–32)
AST: 12 IU/L (ref 0–40)
Albumin/Globulin Ratio: 2 (ref 1.1–2.5)
Albumin: 4.7 g/dL (ref 3.5–4.8)
BUN: 12 mg/dL (ref 8–27)
Calcium: 9.5 mg/dL (ref 8.6–10.2)
GFR calc Af Amer: 83 mL/min/{1.73_m2} (ref >59)
GFR calc non Af Amer: 72 mL/min/{1.73_m2} (ref >59)
Glucose: 115 mg/dL — ABNORMAL HIGH (ref 65–99)
Potassium: 4.5 mmol/L (ref 3.5–5.2)
Sodium: 141 mmol/L (ref 134–144)
Total Bilirubin: 0.4 mg/dL (ref 0.0–1.2)
Total Protein: 7 g/dL (ref 6.0–8.5)

## 2012-11-09 LAB — NMR, LIPOPROFILE
Cholesterol: 119 mg/dL (ref <200)
HDL Cholesterol by NMR: 46 mg/dL (ref >=40)
LDL Particle Number: 964 nmol/L (ref <1000)
Small LDL Particle Number: 757 nmol/L — ABNORMAL HIGH (ref <=527)
Triglycerides by NMR: 129 mg/dL (ref <150)

## 2012-11-10 ENCOUNTER — Other Ambulatory Visit: Payer: Self-pay | Admitting: Nurse Practitioner

## 2012-11-11 NOTE — Telephone Encounter (Signed)
Patient had abnormal labs for thyroid back in May. Do you want to refill or does patient need additional labs? Please advise

## 2012-11-11 NOTE — Telephone Encounter (Signed)
Needs to have thyroid recheck

## 2012-11-15 ENCOUNTER — Telehealth: Payer: Self-pay | Admitting: Nurse Practitioner

## 2012-11-15 NOTE — Telephone Encounter (Signed)
Pt inquiring about appt with cardiologist. Please call her.

## 2012-11-25 ENCOUNTER — Telehealth: Payer: Self-pay | Admitting: Nurse Practitioner

## 2012-11-25 NOTE — Telephone Encounter (Signed)
Samples up front. Patient notified 

## 2012-11-25 NOTE — Telephone Encounter (Signed)
Ok for [employer]

## 2012-11-30 ENCOUNTER — Other Ambulatory Visit: Payer: Self-pay | Admitting: Nurse Practitioner

## 2012-12-01 ENCOUNTER — Encounter: Payer: Self-pay | Admitting: Gastroenterology

## 2012-12-06 ENCOUNTER — Other Ambulatory Visit: Payer: Self-pay | Admitting: Nurse Practitioner

## 2012-12-07 NOTE — Telephone Encounter (Signed)
Last Vit D level 08/08/11  Normal 49

## 2012-12-07 NOTE — Telephone Encounter (Signed)
No refill nTBS

## 2012-12-08 ENCOUNTER — Encounter: Payer: Self-pay | Admitting: Cardiology

## 2012-12-08 ENCOUNTER — Ambulatory Visit (INDEPENDENT_AMBULATORY_CARE_PROVIDER_SITE_OTHER): Payer: Medicare Other | Admitting: Cardiology

## 2012-12-08 VITALS — BP 142/75 | HR 58 | Ht 63.0 in | Wt 184.0 lb

## 2012-12-08 DIAGNOSIS — E785 Hyperlipidemia, unspecified: Secondary | ICD-10-CM | POA: Diagnosis not present

## 2012-12-08 DIAGNOSIS — I10 Essential (primary) hypertension: Secondary | ICD-10-CM | POA: Diagnosis not present

## 2012-12-08 NOTE — Patient Instructions (Signed)
The current medical regimen is effective;  continue present plan and medications.  Follow up as needed 

## 2012-12-08 NOTE — Progress Notes (Signed)
HPI The patient returns for followup after being seen a couple of years ago. I saw her for some atypical chest pain at that time. She had a stress perfusion study which demonstrated a well preserved ejection fraction and no evidence of ischemia or infarct. She returns now for episodes of chest tightness. She is very vague in her description of this. She says it's a sensation of some discomfort when she tries to take a deep breath. She's not describing her pain. It lasted for about 6 weeks it was probably better now and was. She was thought possibly to have some sinus issues and drainage and was treated with antibiotics. She does some vacuuming as her most vigorous exercise and cannot bring this on. She's not describing jaw or arm discomfort. She's not describing resting shortness of breath, PND or orthopnea. She has not been having any palpitations, presyncope or syncope. She has had no weight gain or edema.  Allergies  Allergen Reactions  . Ace Inhibitors Cough  . Meloxicam Nausea And Vomiting  . Piroxicam     REACTION: RASH TO HANDS  . Zithromax [Azithromycin Dihydrate] Itching    Current Outpatient Prescriptions  Medication Sig Dispense Refill  . amLODipine (NORVASC) 5 MG tablet TAKE 1 TABLET (5 MG TOTAL) BY MOUTH DAILY.  30 tablet  2  . aspirin EC 81 MG EC tablet Take 81 mg by mouth daily.        Marland Kitchen BLACK COHOSH PO Take by mouth.        . fish oil-omega-3 fatty acids 1000 MG capsule Take 2 g by mouth daily.        . furosemide (LASIX) 40 MG tablet Take 1 tablet (40 mg total) by mouth daily.  30 tablet  5  . levothyroxine (SYNTHROID, LEVOTHROID) 125 MCG tablet TAKE ONE TABLET BY MOUTH ONE TIME DAILY  30 tablet  0  . metFORMIN (GLUCOPHAGE) 500 MG tablet TAKE ONE TABLET BY MOUTH TWICE A DAY WITH MEALS  60 tablet  1  . metoprolol (TOPROL-XL) 200 MG 24 hr tablet TAKE 1 AND 1/2 TABLETS (300MG  TOTAL) BY MOUTH ONCE A DAY OR ASINSTRUCTED BY PHYSICIAN  45 tablet  2  . NITROSTAT 0.4 MG SL tablet        . olmesartan (BENICAR) 40 MG tablet Take 40 mg by mouth daily.        . rosuvastatin (CRESTOR) 20 MG tablet Take 20 mg by mouth daily.        . Vitamin D, Ergocalciferol, (DRISDOL) 50000 UNITS CAPS TAKE ONE CAPSULE BY MOUTH WEEKLY  12 capsule  0   No current facility-administered medications for this visit.    Past Medical History  Diagnosis Date  . HTN (hypertension)   . Diabetes mellitus   . Abnormal EKG   . Dyslipidemia   . Obesity   . Hypothyroidism   . Vitamin D deficiency   . Osteopenia   . Peripheral edema   . Hyperlipidemia   . Lung cancer   . Depression   . Osteopenia     Past Surgical History  Procedure Laterality Date  . Thyroidectomy    . Lung removal, partial  1998    Right    ROS:  As stated in the HPI and negative for all other systems.  PHYSICAL EXAM BP 142/75  Pulse 58  Ht 5\' 3"  (1.6 m)  Wt 184 lb (83.462 kg)  BMI 32.6 kg/m2  LMP 05/11/1992 GENERAL:  Well appearing HEENT:  Pupils equal round and reactive, fundi not visualized, oral mucosa unremarkable NECK:  No jugular venous distention, waveform within normal limits, carotid upstroke brisk and symmetric, no bruits, no thyromegaly LYMPHATICS:  No cervical, inguinal adenopathy LUNGS:  Clear to auscultation bilaterally BACK:  No CVA tenderness CHEST:  Unremarkable HEART:  PMI not displaced or sustained,S1 and S2 within normal limits, no S3, no S4, no clicks, no rubs, no murmurs ABD:  Flat, positive bowel sounds normal in frequency in pitch, no bruits, no rebound, no guarding, no midline pulsatile mass, no hepatomegaly, no splenomegaly EXT:  2 plus pulses throughout, no edema, no cyanosis no clubbing SKIN:  No rashes no nodules NEURO:  Cranial nerves II through XII grossly intact, motor grossly intact throughout PSYCH:  Cognitively intact, oriented to person place and time   EKG:  Sinus rhythm, rate 61, axis within normal limits, intervals within normal limits, no acute ST-T wave  changes.   ASSESSMENT AND PLAN  CHEST PAIN:  I think the pretest probability of obstructive coronary disease is very low. Her pain is atypical. She has mild cardiovascular risk factors. She donated stress perfusion study 2 years ago which I reviewed. And myself think that further cardiovascular testing will be high yield.  HTN:  Her blood pressure is upper limits of normal but is usually well controlled. At this time no change in therapy is indicated.  DM:  Her last hemoglobin A1c was less than 6. No change in therapy is indicated.

## 2012-12-09 ENCOUNTER — Other Ambulatory Visit: Payer: Self-pay | Admitting: Nurse Practitioner

## 2012-12-10 ENCOUNTER — Encounter (INDEPENDENT_AMBULATORY_CARE_PROVIDER_SITE_OTHER): Payer: Self-pay

## 2012-12-10 ENCOUNTER — Ambulatory Visit (INDEPENDENT_AMBULATORY_CARE_PROVIDER_SITE_OTHER): Payer: Medicare Other | Admitting: Nurse Practitioner

## 2012-12-10 VITALS — BP 122/71 | HR 70 | Temp 98.6°F | Ht 63.0 in | Wt 184.0 lb

## 2012-12-10 DIAGNOSIS — F410 Panic disorder [episodic paroxysmal anxiety] without agoraphobia: Secondary | ICD-10-CM

## 2012-12-10 MED ORDER — CLONAZEPAM 0.5 MG PO TABS: 0.5000 mg | ORAL_TABLET | Freq: Two times a day (BID) | ORAL | Status: DC | PRN

## 2012-12-10 NOTE — Patient Instructions (Signed)

## 2012-12-10 NOTE — Progress Notes (Signed)
  Subjective:    Patient ID: Veronica Paul, female    DOB: [birth year], 77 y.o.   MRN: [id number]  HPI Pt here for follow-up for complaints of chest tightness. Pt was seen in office in September with the same complaints and a cardiologist referral was made. The cardiologist cleared patient and did not think it was heart related. Pt states this tightness for been intermittent for the last 6 weeks. Pt states she feels she has anxiety and "worries about everything".    Review of Systems  All other systems reviewed and are negative.       Objective:   Physical Exam  Vitals reviewed. Constitutional: She appears well-developed and well-nourished.  Cardiovascular: Normal rate, regular rhythm, normal heart sounds and intact distal pulses.   Pulmonary/Chest: Effort normal and breath sounds normal.  Skin: Skin is warm and dry.  Psychiatric: She has a normal mood and affect. Her behavior is normal. Judgment and thought content normal.     BP 122/71  Pulse 70  Temp(Src) 98.6 F (37 C) (Oral)  Ht 5\' 3"  (1.6 m)  Wt 184 lb (83.462 kg)  BMI 32.6 kg/m2  LMP 05/11/1992      Assessment & Plan:   1. Panic attacks    Meds ordered this encounter  Medications  . clonazePAM (KLONOPIN) 0.5 MG tablet    Sig: Take 1 tablet (0.5 mg total) by mouth 2 (two) times daily as needed for anxiety.    Dispense:  60 tablet    Refill:  1    Order Specific Question:  Supervising Provider    Answer:  Deborra Medina   Stress management Walk daily for exercise RTO in 3 months follow up  Veronica Daphine Deutscher, FNP

## 2012-12-13 ENCOUNTER — Encounter: Payer: Self-pay | Admitting: Gastroenterology

## 2012-12-23 ENCOUNTER — Other Ambulatory Visit: Payer: Self-pay | Admitting: Nurse Practitioner

## 2012-12-24 ENCOUNTER — Other Ambulatory Visit: Payer: Self-pay | Admitting: Nurse Practitioner

## 2012-12-27 NOTE — Telephone Encounter (Signed)
Last  Vit D level 08/08/11  Normal 49  MMM

## 2013-01-07 ENCOUNTER — Other Ambulatory Visit: Payer: Self-pay | Admitting: Nurse Practitioner

## 2013-01-12 ENCOUNTER — Ambulatory Visit (INDEPENDENT_AMBULATORY_CARE_PROVIDER_SITE_OTHER): Payer: Medicare Other

## 2013-01-12 ENCOUNTER — Encounter (INDEPENDENT_AMBULATORY_CARE_PROVIDER_SITE_OTHER): Payer: Self-pay

## 2013-01-12 ENCOUNTER — Ambulatory Visit (INDEPENDENT_AMBULATORY_CARE_PROVIDER_SITE_OTHER): Payer: Medicare Other | Admitting: General Practice

## 2013-01-12 VITALS — BP 118/69 | HR 62 | Temp 97.8°F | Ht 63.0 in | Wt 186.8 lb

## 2013-01-12 DIAGNOSIS — M25569 Pain in unspecified knee: Secondary | ICD-10-CM

## 2013-01-12 DIAGNOSIS — M1710 Unilateral primary osteoarthritis, unspecified knee: Secondary | ICD-10-CM | POA: Diagnosis not present

## 2013-01-12 DIAGNOSIS — M25561 Pain in right knee: Secondary | ICD-10-CM

## 2013-01-12 DIAGNOSIS — IMO0002 Reserved for concepts with insufficient information to code with codable children: Secondary | ICD-10-CM

## 2013-01-12 DIAGNOSIS — M1711 Unilateral primary osteoarthritis, right knee: Secondary | ICD-10-CM

## 2013-01-12 MED ORDER — IBUPROFEN 600 MG PO TABS: 600.0000 mg | ORAL_TABLET | Freq: Three times a day (TID) | ORAL | Status: DC | PRN

## 2013-01-12 NOTE — Progress Notes (Signed)
  Subjective:    Patient ID: Veronica Paul, female    DOB: [birth year], 77 y.o.   MRN: [id number]  Knee Pain  The incident occurred more than 1 week ago. There was no injury mechanism. The pain is present in the right knee. The quality of the pain is described as aching. The pain is at a severity of 5/10. The pain has been fluctuating since onset. Pertinent negatives include no inability to bear weight, loss of motion, loss of sensation, muscle weakness, numbness or tingling. She reports no foreign bodies present. Exacerbated by: prolonged standing or walking. She has tried acetaminophen for the symptoms. The treatment provided mild relief.      Review of Systems  Constitutional: Negative for fever and chills.  Respiratory: Negative for chest tightness and shortness of breath.   Cardiovascular: Negative for chest pain and palpitations.  Musculoskeletal:       Right knee pain  Neurological: Negative for tingling, weakness and numbness.       Objective:   Physical Exam  Constitutional: She is oriented to person, place, and time. She appears well-developed and well-nourished.  Pulmonary/Chest: Effort normal and breath sounds normal. No respiratory distress. She exhibits no tenderness.  Musculoskeletal: She exhibits tenderness.  Tenderness and crepitus noted with flexion and extension of right knee. Negative edema  Neurological: She is alert and oriented to person, place, and time.  Skin: Skin is warm and dry.  Psychiatric: She has a normal mood and affect.     [facility] reading (PRIMARY) by Coralie Keens, FNP-C, joint space narrowing of right knee, no fracture or dislocation noted.                                    Assessment & Plan:  1. Knee pain, acute, right  - DG Knee 1-2 Views Right  2. Osteoarthritis of right knee  - ibuprofen (ADVIL,MOTRIN) 600 MG tablet; Take 1 tablet (600 mg total) by mouth every 8 (eight) hours as needed.  Dispense: 20 tablet; Refill: 0 -discussed  and provided information sheet on osteoarthritis -Patient denies allergy to ibuprofen RTO if symptoms worsen, will refer to ortho -Patient verbalized understanding Coralie Keens, FNP-C

## 2013-01-12 NOTE — Patient Instructions (Addendum)
Knee Pain Knee pain can be a result of an injury or other medical conditions. Treatment will depend on the cause of your pain. HOME CARE  Only take medicine as told by your doctor.  Keep a healthy weight. Being overweight can make the knee hurt more.  Stretch before exercising or playing sports.  If there is constant knee pain, change the way you exercise. Ask your doctor for advice.  Make sure shoes fit well. Choose the right shoe for the sport or activity.  Protect your knees. Wear kneepads if needed.  Rest when you are tired. GET HELP RIGHT AWAY IF:   Your knee pain does not stop.  Your knee pain does not get better.  Your knee joint feels hot to the touch.  You have a fever. MAKE SURE YOU:   Understand these instructions.  Will watch this condition.  Will get help right away if you are not doing well or get worse. Document Released: 05/02/2008 Document Revised: 04/28/2011 Document Reviewed: 05/02/2008 [facility] Patient Information 2014 [place], [place].  Osteoarthritis Osteoarthritis is the most common form of arthritis. It is redness, soreness, and swelling (inflammation) affecting the cartilage. Cartilage acts as a cushion, covering the ends of bones where they meet to form a joint. CAUSES  Over time, the cartilage begins to wear away. This causes bone to rub on bone. This produces pain and stiffness in the affected joints. Factors that contribute to this problem are:  Excessive body weight.  Age.  Overuse of joints. SYMPTOMS   People with osteoarthritis usually experience joint pain, swelling, or stiffness.  Over time, the joint may lose its normal shape.  Small deposits of bone (osteophytes) may grow on the edges of the joint.  Bits of bone or cartilage can break off and float inside the joint space. This may cause more pain and damage.  Osteoarthritis can lead to depression, anxiety, feelings of helplessness, and limitations on daily activities. The  most commonly affected joints are in the:  Ends of the fingers.  Thumbs.  Neck.  Lower back.  Knees.  Hips. DIAGNOSIS  Diagnosis is mostly based on your symptoms and exam. Tests may be helpful, including:  X-rays of the affected joint.  A computerized magnetic scan (MRI).  Blood tests to rule out other types of arthritis.  Joint fluid tests. This involves using a needle to draw fluid from the joint and examining the fluid under a microscope. TREATMENT  Goals of treatment are to control pain, improve joint function, maintain a normal body weight, and maintain a healthy lifestyle. Treatment approaches may include:  A prescribed exercise program with rest and joint relief.  Weight control with nutritional education.  Pain relief techniques such as:  Properly applied heat and cold.  Electric pulses delivered to nerve endings under the skin (transcutaneous electrical nerve stimulation, TENS).  Massage.  Certain supplements. Ask your caregiver before using any supplements, especially in combination with prescribed drugs.  Medicines to control pain, such as:  Acetaminophen.  Nonsteroidal anti-inflammatory drugs (NSAIDs), such as naproxen.  Narcotic or central-acting agents, such as tramadol. This drug carries a risk of addiction and is generally prescribed for short-term use.  Corticosteroids. These can be given orally or as injection. This is a short-term treatment, not recommended for routine use.  Surgery to reposition the bones and relieve pain (osteotomy) or to remove loose pieces of bone and cartilage. Joint replacement may be needed in advanced states of osteoarthritis. HOME CARE INSTRUCTIONS  Your caregiver can  recommend specific types of exercise. These may include:  Strengthening exercises. These are done to strengthen the muscles that support joints affected by arthritis. They can be performed with weights or with exercise bands to add resistance.  Aerobic  activities. These are exercises, such as brisk walking or low-impact aerobics, that get your heart pumping. They can help keep your lungs and circulatory system in shape.  Range-of-motion activities. These keep your joints limber.  Balance and agility exercises. These help you maintain daily living skills. Learning about your condition and being actively involved in your care will help improve the course of your osteoarthritis. SEEK MEDICAL CARE IF:   You feel hot or your skin turns red.  You develop a rash in addition to your joint pain.  You have an oral temperature above 102 F (38.9 C). FOR MORE INFORMATION  National Institute of Arthritis and Musculoskeletal and Skin Diseases: www.niams.http://www.myers.net/ [employer] on Aging: https://walker.com/ American College of Rheumatology: www.rheumatology.org Document Released: 02/03/2005 Document Revised: 04/28/2011 Document Reviewed: 05/17/2009 [facility] Patient Information 2014 [place], [place].

## 2013-01-24 ENCOUNTER — Other Ambulatory Visit: Payer: Self-pay | Admitting: Nurse Practitioner

## 2013-01-31 ENCOUNTER — Ambulatory Visit (AMBULATORY_SURGERY_CENTER): Payer: Self-pay | Admitting: *Deleted

## 2013-01-31 VITALS — Ht 63.0 in | Wt 188.6 lb

## 2013-01-31 DIAGNOSIS — Z860100 Personal history of colon polyps, unspecified: Secondary | ICD-10-CM

## 2013-01-31 MED ORDER — NA SULFATE-K SULFATE-MG SULF 17.5-3.13-1.6 GM/177ML PO SOLN: ORAL | Status: DC

## 2013-01-31 NOTE — Progress Notes (Signed)
Patient denies any allergies to eggs or soy. Patient denies any problems with anesthesia.  

## 2013-02-03 ENCOUNTER — Telehealth: Payer: Self-pay | Admitting: Nurse Practitioner

## 2013-02-07 NOTE — Telephone Encounter (Signed)
Fri appt to be resched by Toniann Fail

## 2013-02-08 NOTE — Telephone Encounter (Signed)
appt made with mmm 

## 2013-02-09 ENCOUNTER — Ambulatory Visit (INDEPENDENT_AMBULATORY_CARE_PROVIDER_SITE_OTHER): Payer: Medicare Other | Admitting: Nurse Practitioner

## 2013-02-09 ENCOUNTER — Encounter: Payer: Self-pay | Admitting: Nurse Practitioner

## 2013-02-09 VITALS — BP 135/75 | HR 63 | Temp 97.0°F | Ht 63.0 in | Wt 186.0 lb

## 2013-02-09 DIAGNOSIS — R609 Edema, unspecified: Secondary | ICD-10-CM

## 2013-02-09 DIAGNOSIS — E119 Type 2 diabetes mellitus without complications: Secondary | ICD-10-CM

## 2013-02-09 DIAGNOSIS — F329 Major depressive disorder, single episode, unspecified: Secondary | ICD-10-CM

## 2013-02-09 DIAGNOSIS — E785 Hyperlipidemia, unspecified: Secondary | ICD-10-CM

## 2013-02-09 DIAGNOSIS — M8580 Other specified disorders of bone density and structure, unspecified site: Secondary | ICD-10-CM

## 2013-02-09 DIAGNOSIS — M899 Disorder of bone, unspecified: Secondary | ICD-10-CM

## 2013-02-09 DIAGNOSIS — F411 Generalized anxiety disorder: Secondary | ICD-10-CM

## 2013-02-09 DIAGNOSIS — R600 Localized edema: Secondary | ICD-10-CM

## 2013-02-09 DIAGNOSIS — I10 Essential (primary) hypertension: Secondary | ICD-10-CM | POA: Diagnosis not present

## 2013-02-09 DIAGNOSIS — F32A Depression, unspecified: Secondary | ICD-10-CM

## 2013-02-09 LAB — POCT GLYCOSYLATED HEMOGLOBIN (HGB A1C): Hemoglobin A1C: 5.7

## 2013-02-09 MED ORDER — CLONAZEPAM 0.5 MG PO TABS: 0.5000 mg | ORAL_TABLET | Freq: Two times a day (BID) | ORAL | Status: DC | PRN

## 2013-02-09 NOTE — Progress Notes (Signed)
Subjective:    Patient ID: Veronica Paul, female    DOB: [birth year], 77 y.o.   MRN: [id number]  Patient here today for follow up- doing well- no complaints- no changes since last visit  Thyroid Problem Presents for follow-up visit. Symptoms include weight loss. Patient reports no anxiety, constipation, diaphoresis, dry skin, fatigue, leg swelling, palpitations or visual change. The symptoms have been stable. Her past medical history is significant for diabetes and hyperlipidemia.  Diabetes She presents for her follow-up diabetic visit. She has type 2 diabetes mellitus. No MedicAlert identification noted. The initial diagnosis of diabetes was made 3 years ago. Her disease course has been stable. There are no hypoglycemic associated symptoms. Pertinent negatives for hypoglycemia include no headaches. Associated symptoms include chest pain and weight loss. Pertinent negatives for diabetes include no blurred vision, no fatigue, no foot paresthesias, no polydipsia, no polyphagia, no polyuria and no visual change. There are no hypoglycemic complications. Symptoms are stable. There are no diabetic complications. Risk factors for coronary artery disease include dyslipidemia, obesity, post-menopausal and hypertension. When asked about current treatments, none were reported. She is compliant with treatment all of the time. Her weight is stable. She is following a diabetic diet. When asked about meal planning, she reported none. She has not had a previous visit with a dietician. She rarely participates in exercise. There is no change in her home blood glucose trend. (Patient doesn't check blood sugars at home.) An ACE inhibitor/angiotensin II receptor blocker is not being taken. She does not see a podiatrist.Eye exam is current (October 2013).  Hypertension This is a chronic problem. The current episode started more than 1 year ago. The problem is unchanged. The problem is controlled. Associated symptoms include  chest pain and shortness of breath. Pertinent negatives include no blurred vision, headaches, malaise/fatigue, orthopnea, palpitations or peripheral edema. Agents associated with hypertension include thyroid hormones. Risk factors for coronary artery disease include obesity, post-menopausal state, dyslipidemia and diabetes mellitus. Past treatments include calcium channel blockers, diuretics, beta blockers and angiotensin blockers. The current treatment provides significant improvement. Compliance problems include exercise and diet.  Hypertensive end-organ damage includes a thyroid problem.  Hyperlipidemia The current episode started more than 1 year ago. The problem is controlled. Recent lipid tests were reviewed and are normal. Exacerbating diseases include diabetes and obesity. Associated symptoms include chest pain and shortness of breath. Pertinent negatives include no focal sensory loss, leg pain or myalgias. Current antihyperlipidemic treatment includes statins. The current treatment provides significant improvement of lipids. Compliance problems include adherence to exercise and adherence to diet.  Risk factors for coronary artery disease include diabetes mellitus, hypertension, obesity and post-menopausal.  GAD Just started on clonazepam at last visit has really help her c/o chest pain at last visit- SHe only takes as needed.  Review of Systems  Constitutional: Positive for weight loss. Negative for malaise/fatigue, diaphoresis and fatigue.  Eyes: Negative for blurred vision.  Respiratory: Positive for shortness of breath.   Cardiovascular: Positive for chest pain. Negative for palpitations and orthopnea.  Gastrointestinal: Negative for constipation.  Endocrine: Negative for polydipsia, polyphagia and polyuria.  Musculoskeletal: Negative for myalgias.  Neurological: Negative for headaches.  All other systems reviewed and are negative.       Objective:   Physical Exam  Constitutional:  She is oriented to person, place, and time. She appears well-developed and well-nourished.  HENT:  Nose: Nose normal.  Mouth/Throat: Oropharynx is clear and moist.  Eyes: EOM are normal.  Neck: Trachea  normal, normal range of motion and full passive range of motion without pain. Neck supple. No JVD present. Carotid bruit is not present. No thyromegaly present.  Cardiovascular: Normal rate, regular rhythm, normal heart sounds and intact distal pulses.  Exam reveals no gallop and no friction rub.   No murmur heard. Pulmonary/Chest: Effort normal and breath sounds normal.  Abdominal: Soft. Bowel sounds are normal. She exhibits no distension and no mass. There is no tenderness.  Musculoskeletal: Normal range of motion.  Lymphadenopathy:    She has no cervical adenopathy.  Neurological: She is alert and oriented to person, place, and time. She has normal reflexes.  Positive 4/4 monofilament bil feet.  Skin: Skin is warm and dry.  No callus formation bil feet  Psychiatric: She has a normal mood and affect. Her behavior is normal. Judgment and thought content normal.   BP 135/75  Pulse 63  Temp(Src) 97 F (36.1 C) (Oral)  Ht 5\' 3"  (1.6 m)  Wt 186 lb (84.369 kg)  BMI 32.96 kg/m2  LMP 05/11/1992  Results for orders placed in visit on 02/09/13  POCT GLYCOSYLATED HEMOGLOBIN (HGB A1C)      Result Value Range   Hemoglobin A1C 5.7%            Assessment & Plan:   1. HTN (hypertension)   2. Dyslipidemia   3. Osteopenia   4. Peripheral edema   5. Depression   6. Type II or unspecified type diabetes mellitus without mention of complication, not stated as uncontrolled   7. GAD (generalized anxiety disorder)    Orders Placed This Encounter  Procedures  . CMP14+EGFR  . NMR, lipoprofile  . POCT glycosylated hemoglobin (Hb A1C)     Continue all meds Labs pending Diet and exercise encouraged Health maintenance reviewed Follow up in 3 months  Mary-Margaret Daphine Deutscher, FNP

## 2013-02-09 NOTE — Patient Instructions (Signed)
Diabetes and Foot Care Diabetes may cause you to have problems because of poor blood supply (circulation) to your feet and legs. This may cause the skin on your feet to become thinner, break easier, and heal more slowly. Your skin may become dry, and the skin may peel and crack. You may also have nerve damage in your legs and feet causing decreased feeling in them. You may not notice minor injuries to your feet that could lead to infections or more serious problems. Taking care of your feet is one of the most important things you can do for yourself.  HOME CARE INSTRUCTIONS  Wear shoes at all times, even in the house. Do not go barefoot. Bare feet are easily injured.  Check your feet daily for blisters, cuts, and redness. If you cannot see the bottom of your feet, use a mirror or ask someone for help.  Wash your feet with warm water (do not use hot water) and mild soap. Then pat your feet and the areas between your toes until they are completely dry. Do not soak your feet as this can dry your skin.  Apply a moisturizing lotion or petroleum jelly (that does not contain alcohol and is unscented) to the skin on your feet and to dry, brittle toenails. Do not apply lotion between your toes.  Trim your toenails straight across. Do not dig under them or around the cuticle. File the edges of your nails with an emery board or nail file.  Do not cut corns or calluses or try to remove them with medicine.  Wear clean socks or stockings every day. Make sure they are not too tight. Do not wear knee-high stockings since they may decrease blood flow to your legs.  Wear shoes that fit properly and have enough cushioning. To break in new shoes, wear them for just a few hours a day. This prevents you from injuring your feet. Always look in your shoes before you put them on to be sure there are no objects inside.  Do not cross your legs. This may decrease the blood flow to your feet.  If you find a minor scrape,  cut, or break in the skin on your feet, keep it and the skin around it clean and dry. These areas may be cleansed with mild soap and water. Do not cleanse the area with peroxide, alcohol, or iodine.  When you remove an adhesive bandage, be sure not to damage the skin around it.  If you have a wound, look at it several times a day to make sure it is healing.  Do not use heating pads or hot water bottles. They may burn your skin. If you have lost feeling in your feet or legs, you may not know it is happening until it is too late.  Make sure your health care provider performs a complete foot exam at least annually or more often if you have foot problems. Report any cuts, sores, or bruises to your health care provider immediately. SEEK MEDICAL CARE IF:   You have an injury that is not healing.  You have cuts or breaks in the skin.  You have an ingrown nail.  You notice redness on your legs or feet.  You feel burning or tingling in your legs or feet.  You have pain or cramps in your legs and feet.  Your legs or feet are numb.  Your feet always feel cold. SEEK IMMEDIATE MEDICAL CARE IF:   There is increasing redness,   swelling, or pain in or around a wound.  There is a red line that goes up your leg.  Pus is coming from a wound.  You develop a fever or as directed by your health care provider.  You notice a bad smell coming from an ulcer or wound. Document Released: 02/01/2000 Document Revised: 10/06/2012 Document Reviewed: 07/13/2012 Exit[employer] Patient Information 2014 [employer].  

## 2013-02-11 ENCOUNTER — Ambulatory Visit: Payer: Medicare Other | Admitting: Nurse Practitioner

## 2013-02-12 LAB — CMP14+EGFR
ALT: 12 IU/L (ref 0–32)
AST: 15 IU/L (ref 0–40)
Albumin/Globulin Ratio: 2.3 (ref 1.1–2.5)
BUN/Creatinine Ratio: 18 (ref 11–26)
CO2: 28 mmol/L (ref 18–29)
Calcium: 9.8 mg/dL (ref 8.6–10.2)
Chloride: 98 mmol/L (ref 97–108)
GFR calc non Af Amer: 70 mL/min/{1.73_m2} (ref >59)
Potassium: 4.5 mmol/L (ref 3.5–5.2)
Sodium: 142 mmol/L (ref 134–144)
Total Bilirubin: 0.3 mg/dL (ref 0.0–1.2)

## 2013-02-12 LAB — NMR, LIPOPROFILE
HDL Cholesterol by NMR: 49 mg/dL (ref >=40)
LDL Particle Number: 978 nmol/L (ref <1000)
LDL Size: 20.5 nm — ABNORMAL LOW (ref >20.5)
Small LDL Particle Number: 664 nmol/L — ABNORMAL HIGH (ref <=527)

## 2013-02-22 ENCOUNTER — Encounter: Payer: Self-pay | Admitting: Gastroenterology

## 2013-02-22 ENCOUNTER — Ambulatory Visit (AMBULATORY_SURGERY_CENTER): Payer: Medicare Other | Admitting: Gastroenterology

## 2013-02-22 VITALS — BP 121/63 | HR 67 | Temp 98.2°F | Resp 20 | Ht 63.0 in | Wt 186.0 lb

## 2013-02-22 DIAGNOSIS — Z860100 Personal history of colon polyps, unspecified: Secondary | ICD-10-CM

## 2013-02-22 DIAGNOSIS — I10 Essential (primary) hypertension: Secondary | ICD-10-CM | POA: Diagnosis not present

## 2013-02-22 DIAGNOSIS — K5730 Diverticulosis of large intestine without perforation or abscess without bleeding: Secondary | ICD-10-CM

## 2013-02-22 DIAGNOSIS — E669 Obesity, unspecified: Secondary | ICD-10-CM | POA: Diagnosis not present

## 2013-02-22 DIAGNOSIS — F341 Dysthymic disorder: Secondary | ICD-10-CM | POA: Diagnosis not present

## 2013-02-22 DIAGNOSIS — Z1211 Encounter for screening for malignant neoplasm of colon: Secondary | ICD-10-CM | POA: Diagnosis not present

## 2013-02-22 DIAGNOSIS — E119 Type 2 diabetes mellitus without complications: Secondary | ICD-10-CM | POA: Diagnosis not present

## 2013-02-22 LAB — GLUCOSE, CAPILLARY
GLUCOSE-CAPILLARY: 103 mg/dL — AB (ref 70–99)
Glucose-Capillary: 99 mg/dL (ref 70–99)

## 2013-02-22 MED ORDER — SODIUM CHLORIDE 0.9 % IV SOLN: 500.0000 mL | INTRAVENOUS | Status: DC

## 2013-02-22 NOTE — Patient Instructions (Signed)
Discharge instructions given with verbal understanding. Handouts on diverticulosis and a high fiber diet. Resume previous medications. YOU HAD AN ENDOSCOPIC PROCEDURE TODAY AT THE [facility]: Refer to the procedure report that was given to you for any specific questions about what was found during the examination.  If the procedure report does not answer your questions, please call your gastroenterologist to clarify.  If you requested that your care partner not be given the details of your procedure findings, then the procedure report has been included in a sealed envelope for you to review at your convenience later.  YOU SHOULD EXPECT: Some feelings of bloating in the abdomen. Passage of more gas than usual.  Walking can help get rid of the air that was put into your GI tract during the procedure and reduce the bloating. If you had a lower endoscopy (such as a colonoscopy or flexible sigmoidoscopy) you may notice spotting of blood in your stool or on the toilet paper. If you underwent a bowel prep for your procedure, then you may not have a normal bowel movement for a few days.  DIET: Your first meal following the procedure should be a light meal and then it is ok to progress to your normal diet.  A half-sandwich or bowl of soup is an example of a good first meal.  Heavy or fried foods are harder to digest and may make you feel nauseous or bloated.  Likewise meals heavy in dairy and vegetables can cause extra gas to form and this can also increase the bloating.  Drink plenty of fluids but you should avoid alcoholic beverages for 24 hours.  ACTIVITY: Your care partner should take you home directly after the procedure.  You should plan to take it easy, moving slowly for the rest of the day.  You can resume normal activity the day after the procedure however you should NOT DRIVE or use heavy machinery for 24 hours (because of the sedation medicines used during the test).    SYMPTOMS TO REPORT  IMMEDIATELY: A gastroenterologist can be reached at any hour.  During normal business hours, 8:30 AM to 5:00 PM Monday through Friday, call [phone].  After hours and on weekends, please call the GI answering service at [phone] who will take a message and have the physician on call contact you.   Following lower endoscopy (colonoscopy or flexible sigmoidoscopy):  Excessive amounts of blood in the stool  Significant tenderness or worsening of abdominal pains  Swelling of the abdomen that is new, acute  Fever of 100F or higher FOLLOW UP: If any biopsies were taken you will be contacted by phone or by letter within the next 1-3 weeks.  Call your gastroenterologist if you have not heard about the biopsies in 3 weeks.  Our staff will call the home number listed on your records the next business day following your procedure to check on you and address any questions or concerns that you may have at that time regarding the information given to you following your procedure. This is a courtesy call and so if there is no answer at the home number and we have not heard from you through the emergency physician on call, we will assume that you have returned to your regular daily activities without incident.  SIGNATURES/CONFIDENTIALITY: You and/or your care partner have signed paperwork which will be entered into your electronic medical record.  These signatures attest to the fact that that the information above on your After   Visit Summary has been reviewed and is understood.  Full responsibility of the confidentiality of this discharge information lies with you and/or your care-partner. 

## 2013-02-22 NOTE — Progress Notes (Signed)
Report to pacu rn, vss, bbs=clear 

## 2013-02-22 NOTE — Op Note (Signed)
[facility]  [address]. [place], [id number]   COLONOSCOPY PROCEDURE REPORT  PATIENT: [name], [name]  MR#: [id number] BIRTHDATE: 1935-11-30 , 77  yrs. old GENDER: Female ENDOSCOPIST: Inda Castle, MD REFERRED BP:ZWCHEN Laurance Flatten, M.D. PROCEDURE DATE:  02/22/2013 PROCEDURE:   Colonoscopy, diagnostic First Screening Colonoscopy - Avg.  risk and is 50 yrs.  old or older - No.  Prior Negative Screening - Now for repeat screening. N/A  History of Adenoma - Now for follow-up colonoscopy & has been > or = to 3 yrs.  Yes hx of adenoma.  Has been 3 or more years since last colonoscopy.  Polyps Removed Today? No.  Recommend repeat exam, <10 yrs? No. ASA CLASS:   Class II INDICATIONS:Patient's personal history of colon polyps 2004, 2009 MEDICATIONS: MAC sedation, administered by CRNA and Propofol (Diprivan) 230 mg IV  DESCRIPTION OF PROCEDURE:   After the risks benefits and alternatives of the procedure were thoroughly explained, informed consent was obtained.  A digital rectal exam revealed no abnormalities of the rectum.   The [id number]  endoscope was introduced through the anus and advanced to the cecum, which was identified by both the appendix and ileocecal valve. No adverse events experienced.   The quality of the prep was excellent using Suprep  The instrument was then slowly withdrawn as the colon was fully examined.      COLON FINDINGS: There was severe diverticulosis noted in the sigmoid colon with associated muscular hypertrophy.   There was moderate scattered diverticulosis noted at the cecum, in the ascending colon, and transverse colon.   The colon was otherwise normal. There was no diverticulosis, inflammation, polyps or cancers unless previously stated.  Retroflexed views revealed no abnormalities. The time to cecum=3 minutes 31 seconds.  Withdrawal time=7 minutes 35 seconds.  The scope was withdrawn and the procedure  completed. COMPLICATIONS: There were no complications.  ENDOSCOPIC IMPRESSION: 1.   There was severe diverticulosis noted in the sigmoid colon 2.   There was moderate diverticulosis noted at the cecum, in the ascending colon, and transverse colon 3.   The colon was otherwise normal  RECOMMENDATIONS: Given your age, you will not need another colonoscopy for colon cancer screening or polyp surveillance.  These types of tests usually stop around the age 73.   eSigned:  Inda Castle, MD 02/22/2013 9:40 AM   cc:   PATIENT NAME:  Veronica Paul, Veronica Paul MR#: [id number]

## 2013-02-23 ENCOUNTER — Telehealth: Payer: Self-pay | Admitting: *Deleted

## 2013-02-23 NOTE — Telephone Encounter (Signed)
  Follow up Call-  Call back number 02/22/2013  Post procedure Call Back phone  # [phone]  Permission to leave phone message Yes     Patient questions:  Do you have a fever, pain , or abdominal swelling? no Pain Score  0 *  Have you tolerated food without any problems? yes  Have you been able to return to your normal activities? yes  Do you have any questions about your discharge instructions: Diet   no Medications  no Follow up visit  no  Do you have questions or concerns about your Care? no  Actions: * If pain score is 4 or above: No action needed, pain <4.

## 2013-02-26 ENCOUNTER — Other Ambulatory Visit: Payer: Self-pay | Admitting: Nurse Practitioner

## 2013-03-04 ENCOUNTER — Encounter: Payer: Self-pay | Admitting: Cardiology

## 2013-03-18 ENCOUNTER — Telehealth: Payer: Self-pay | Admitting: Nurse Practitioner

## 2013-03-18 ENCOUNTER — Other Ambulatory Visit: Payer: Self-pay | Admitting: Nurse Practitioner

## 2013-03-18 NOTE — Telephone Encounter (Signed)
Only hade samples of the benicar patient aware

## 2013-03-22 NOTE — Telephone Encounter (Signed)
Last Vit D Level 05/05/11 normal 49 MMM

## 2013-03-25 ENCOUNTER — Telehealth: Payer: Self-pay | Admitting: Nurse Practitioner

## 2013-03-28 NOTE — Telephone Encounter (Signed)
Patient aware.

## 2013-03-31 ENCOUNTER — Telehealth: Payer: Self-pay | Admitting: Nurse Practitioner

## 2013-04-01 NOTE — Telephone Encounter (Signed)
No samples Check back next week

## 2013-04-08 ENCOUNTER — Telehealth: Payer: Self-pay | Admitting: Nurse Practitioner

## 2013-04-08 MED ORDER — ROSUVASTATIN CALCIUM 20 MG PO TABS: 20.0000 mg | ORAL_TABLET | Freq: Every day | ORAL | Status: DC

## 2013-04-08 NOTE — Telephone Encounter (Signed)
Samples placed up front for patient to pickup.

## 2013-04-26 ENCOUNTER — Other Ambulatory Visit: Payer: Self-pay | Admitting: Nurse Practitioner

## 2013-04-26 ENCOUNTER — Telehealth: Payer: Self-pay | Admitting: Nurse Practitioner

## 2013-04-26 NOTE — Telephone Encounter (Signed)
Samples up front

## 2013-04-28 NOTE — Telephone Encounter (Signed)
Please call in klonopin with 0 refills 

## 2013-04-28 NOTE — Telephone Encounter (Signed)
rx called into pharmacy

## 2013-04-28 NOTE — Telephone Encounter (Signed)
Last seen for follow up chronic medical problems on 12-24. Please advise. If approved please route to Pool B so nurse can phone in to pharmacy

## 2013-05-23 ENCOUNTER — Ambulatory Visit (INDEPENDENT_AMBULATORY_CARE_PROVIDER_SITE_OTHER): Payer: Medicare Other | Admitting: Nurse Practitioner

## 2013-05-23 ENCOUNTER — Encounter: Payer: Self-pay | Admitting: Nurse Practitioner

## 2013-05-23 VITALS — BP 126/69 | HR 72 | Temp 96.9°F | Ht 63.0 in | Wt 194.0 lb

## 2013-05-23 DIAGNOSIS — F3289 Other specified depressive episodes: Secondary | ICD-10-CM

## 2013-05-23 DIAGNOSIS — E785 Hyperlipidemia, unspecified: Secondary | ICD-10-CM

## 2013-05-23 DIAGNOSIS — I10 Essential (primary) hypertension: Secondary | ICD-10-CM

## 2013-05-23 DIAGNOSIS — F411 Generalized anxiety disorder: Secondary | ICD-10-CM

## 2013-05-23 DIAGNOSIS — F329 Major depressive disorder, single episode, unspecified: Secondary | ICD-10-CM

## 2013-05-23 DIAGNOSIS — E119 Type 2 diabetes mellitus without complications: Secondary | ICD-10-CM

## 2013-05-23 DIAGNOSIS — R609 Edema, unspecified: Secondary | ICD-10-CM | POA: Diagnosis not present

## 2013-05-23 DIAGNOSIS — Z23 Encounter for immunization: Secondary | ICD-10-CM | POA: Diagnosis not present

## 2013-05-23 DIAGNOSIS — E669 Obesity, unspecified: Secondary | ICD-10-CM

## 2013-05-23 DIAGNOSIS — E039 Hypothyroidism, unspecified: Secondary | ICD-10-CM | POA: Insufficient documentation

## 2013-05-23 DIAGNOSIS — F32A Depression, unspecified: Secondary | ICD-10-CM

## 2013-05-23 LAB — POCT GLYCOSYLATED HEMOGLOBIN (HGB A1C): Hemoglobin A1C: 5.8

## 2013-05-23 MED ORDER — OLMESARTAN MEDOXOMIL 40 MG PO TABS: 40.0000 mg | ORAL_TABLET | Freq: Every day | ORAL | Status: DC

## 2013-05-23 NOTE — Progress Notes (Signed)
Subjective:    Patient ID: Veronica Paul, female    DOB: April 23, 1935, 78 y.o.   MRN: [id number]  Patient here today for follow up- doing well- no complaints- no changes since last visit  Thyroid Problem Presents for follow-up visit. Symptoms include weight loss. Patient reports no anxiety, constipation, diaphoresis, dry skin, fatigue, leg swelling, palpitations or visual change. The symptoms have been stable. Her past medical history is significant for diabetes and hyperlipidemia.  Diabetes She presents for her follow-up diabetic visit. She has type 2 diabetes mellitus. No MedicAlert identification noted. The initial diagnosis of diabetes was made 3 years ago. Her disease course has been stable. There are no hypoglycemic associated symptoms. Pertinent negatives for hypoglycemia include no headaches. Associated symptoms include chest pain and weight loss. Pertinent negatives for diabetes include no blurred vision, no fatigue, no foot paresthesias, no polydipsia, no polyphagia, no polyuria and no visual change. There are no hypoglycemic complications. Symptoms are stable. There are no diabetic complications. Risk factors for coronary artery disease include dyslipidemia, obesity, post-menopausal and hypertension. When asked about current treatments, none were reported. She is compliant with treatment all of the time. Her weight is stable. She is following a diabetic diet. When asked about meal planning, she reported none. She has not had a previous visit with a dietician. She rarely participates in exercise. There is no change in her home blood glucose trend. (Patient doesn't check blood sugars at home.) An ACE inhibitor/angiotensin II receptor blocker is not being taken. She does not see a podiatrist.Eye exam is current (October 2013).  Hypertension This is a chronic problem. The current episode started more than 1 year ago. The problem is unchanged. The problem is controlled. Associated symptoms include  chest pain and shortness of breath. Pertinent negatives include no blurred vision, headaches, malaise/fatigue, orthopnea, palpitations or peripheral edema. Agents associated with hypertension include thyroid hormones. Risk factors for coronary artery disease include obesity, post-menopausal state, dyslipidemia and diabetes mellitus. Past treatments include calcium channel blockers, diuretics, beta blockers and angiotensin blockers. The current treatment provides significant improvement. Compliance problems include exercise and diet.  Hypertensive end-organ damage includes a thyroid problem.  Hyperlipidemia The current episode started more than 1 year ago. The problem is controlled. Recent lipid tests were reviewed and are normal. Exacerbating diseases include diabetes and obesity. Associated symptoms include chest pain and shortness of breath. Pertinent negatives include no focal sensory loss, leg pain or myalgias. Current antihyperlipidemic treatment includes statins (patient has been out of crestor for over a week- can't afford to buy- usually gets samples.). The current treatment provides significant improvement of lipids. Compliance problems include adherence to exercise and adherence to diet.  Risk factors for coronary artery disease include diabetes mellitus, hypertension, obesity and post-menopausal.  GAD Just started on clonazepam at last visit has really help her c/o chest pain at last visit- SHe only takes as needed.  Review of Systems  Constitutional: Positive for weight loss. Negative for malaise/fatigue, diaphoresis and fatigue.  Eyes: Negative for blurred vision.  Respiratory: Positive for shortness of breath.   Cardiovascular: Positive for chest pain. Negative for palpitations and orthopnea.  Gastrointestinal: Negative for constipation.  Endocrine: Negative for polydipsia, polyphagia and polyuria.  Musculoskeletal: Negative for myalgias.  Neurological: Negative for headaches.  All other  systems reviewed and are negative.       Objective:   Physical Exam  Constitutional: She is oriented to person, place, and time. She appears well-developed and well-nourished.  HENT:  Nose:  Nose normal.  Mouth/Throat: Oropharynx is clear and moist.  Eyes: EOM are normal.  Neck: Trachea normal, normal range of motion and full passive range of motion without pain. Neck supple. No JVD present. Carotid bruit is not present. No thyromegaly present.  Cardiovascular: Normal rate, regular rhythm, normal heart sounds and intact distal pulses.  Exam reveals no gallop and no friction rub.   No murmur heard. Pulmonary/Chest: Effort normal and breath sounds normal.  Abdominal: Soft. Bowel sounds are normal. She exhibits no distension and no mass. There is no tenderness.  Musculoskeletal: Normal range of motion.  Lymphadenopathy:    She has no cervical adenopathy.  Neurological: She is alert and oriented to person, place, and time. She has normal reflexes.  Positive 4/4 monofilament bil feet.  Skin: Skin is warm and dry.  No callus formation bil feet  Psychiatric: She has a normal mood and affect. Her behavior is normal. Judgment and thought content normal.   BP 126/69  Pulse 72  Temp(Src) 96.9 F (36.1 C) (Oral)  Ht _0  (1.6 m)  Wt 194 lb (87.998 kg)  BMI 34.37 kg/m2  LMP 05/11/1992  Results for orders placed in visit on 05/23/13  POCT GLYCOSYLATED HEMOGLOBIN (HGB A1C)      Result Value Ref Range   Hemoglobin A1C 5.8            Assessment & Plan:   1. Dyslipidemia   2. HTN (hypertension)   3. Type II or unspecified type diabetes mellitus without mention of complication, not stated as uncontrolled   4. Peripheral edema   5. Obesity   6. GAD (generalized anxiety disorder)   7. Depression   8. Hypothyroidism    Orders Placed This Encounter  Procedures  . HM DEXA SCAN    This external order was created through the Results Console.  . NMR, lipoprofile  . CMP14+EGFR  .  Thyroid Panel With TSH  . POCT glycosylated hemoglobin (Hb A1C)   Meds ordered this encounter  Medications  . olmesartan (BENICAR) 40 MG tablet    Sig: Take 1 tablet (40 mg total) by mouth daily.    Dispense:  30 tablet    Refill:  3    Order Specific Question:  Supervising Provider    Answer:  Chipper Herb [[id number]]  prevnar 13 today Labs pending Health maintenance reviewed Diet and exercise encouraged Continue all meds Follow up  In 3 months   [place], FNP

## 2013-05-23 NOTE — Addendum Note (Signed)
Addended by: Josephine Cables on: 05/23/2013 09:39 AM   Modules accepted: Orders

## 2013-05-23 NOTE — Patient Instructions (Signed)

## 2013-05-25 LAB — CMP14+EGFR
A/G RATIO: 2.6 — AB (ref 1.1–2.5)
ALK PHOS: 62 IU/L (ref 39–117)
ALT: 12 IU/L (ref 0–32)
AST: 14 IU/L (ref 0–40)
Albumin: 4.6 g/dL (ref 3.5–4.8)
BUN / CREAT RATIO: 18 (ref 11–26)
BUN: 16 mg/dL (ref 8–27)
CO2: 27 mmol/L (ref 18–29)
CREATININE: 0.91 mg/dL (ref 0.57–1.00)
Calcium: 9.5 mg/dL (ref 8.7–10.3)
Chloride: 99 mmol/L (ref 97–108)
GFR calc non Af Amer: 62 mL/min/{1.73_m2} (ref >59)
GFR, EST AFRICAN AMERICAN: 71 mL/min/{1.73_m2} (ref >59)
GLOBULIN, TOTAL: 1.8 g/dL (ref 1.5–4.5)
Glucose: 110 mg/dL — ABNORMAL HIGH (ref 65–99)
Potassium: 4.1 mmol/L (ref 3.5–5.2)
SODIUM: 142 mmol/L (ref 134–144)
Total Bilirubin: 0.5 mg/dL (ref 0.0–1.2)
Total Protein: 6.4 g/dL (ref 6.0–8.5)

## 2013-05-25 LAB — NMR, LIPOPROFILE
CHOLESTEROL: 150 mg/dL (ref <200)
HDL CHOLESTEROL BY NMR: 56 mg/dL (ref >=40)
HDL Particle Number: 39.2 umol/L (ref >=30.5)
LDL PARTICLE NUMBER: 1323 nmol/L — AB (ref <1000)
LDL Size: 20.8 nm (ref >20.5)
LDLC SERPL CALC-MCNC: 72 mg/dL (ref <100)
LP-IR Score: 48 — ABNORMAL HIGH (ref <=45)
Small LDL Particle Number: 676 nmol/L — ABNORMAL HIGH (ref <=527)
TRIGLYCERIDES BY NMR: 110 mg/dL (ref <150)

## 2013-05-26 ENCOUNTER — Other Ambulatory Visit: Payer: Self-pay | Admitting: Nurse Practitioner

## 2013-05-26 LAB — THYROID PANEL WITH TSH
Free Thyroxine Index: 3 (ref 1.2–4.9)
T3 Uptake Ratio: 29 % (ref 24–39)
T4, Total: 10.4 ug/dL (ref 4.5–12.0)
TSH: 0.012 u[IU]/mL — ABNORMAL LOW (ref 0.450–4.500)

## 2013-05-26 LAB — SPECIMEN STATUS REPORT

## 2013-05-26 MED ORDER — LEVOTHYROXINE SODIUM 112 MCG PO TABS: 112.0000 ug | ORAL_TABLET | Freq: Every day | ORAL | Status: DC

## 2013-05-26 NOTE — Telephone Encounter (Signed)
Patient NTBS for follow up and lab work Please call in klonopin with 0 refills

## 2013-05-26 NOTE — Telephone Encounter (Signed)
Last seen 05/23/13,last filled 04/28/13. Uses [facility]

## 2013-05-27 ENCOUNTER — Telehealth: Payer: Self-pay | Admitting: Nurse Practitioner

## 2013-05-27 ENCOUNTER — Telehealth: Payer: Self-pay | Admitting: *Deleted

## 2013-05-27 MED ORDER — LOSARTAN POTASSIUM 100 MG PO TABS: 100.0000 mg | ORAL_TABLET | Freq: Every day | ORAL | Status: DC

## 2013-05-27 NOTE — Telephone Encounter (Signed)
benicar up front no samples of crestor

## 2013-05-27 NOTE — Telephone Encounter (Signed)
Per [place] pts copay for Benicar is 90.00 and she cannot afford. Would like to know if med can be changed to something else. Please advise

## 2013-05-27 NOTE — Telephone Encounter (Signed)
Called in.

## 2013-05-27 NOTE — Telephone Encounter (Signed)
Insurance would not cover benicar - changed to losartan- follow up in 1 month to make sure blood pressure is still under control on new med. If can keep check of it at home do so and if remains >462 systolic let me know sooner.

## 2013-05-30 ENCOUNTER — Ambulatory Visit (INDEPENDENT_AMBULATORY_CARE_PROVIDER_SITE_OTHER): Payer: Medicare Other | Admitting: Family Medicine

## 2013-05-30 VITALS — BP 130/75 | HR 81 | Temp 97.4°F | Ht 63.0 in | Wt 195.4 lb

## 2013-05-30 DIAGNOSIS — J069 Acute upper respiratory infection, unspecified: Secondary | ICD-10-CM

## 2013-05-30 MED ORDER — AMOXICILLIN 875 MG PO TABS: 875.0000 mg | ORAL_TABLET | Freq: Two times a day (BID) | ORAL | Status: DC

## 2013-05-30 MED ORDER — BENZONATATE 100 MG PO CAPS: 100.0000 mg | ORAL_CAPSULE | Freq: Three times a day (TID) | ORAL | Status: DC | PRN

## 2013-05-30 NOTE — Progress Notes (Signed)
   Subjective:    Patient ID: Veronica Paul, female    DOB: [birth year], 78 y.o.   MRN: [id number]  HPI This 78 y.o. female presents for evaluation of URI sx's since taking prevnar immunization last week according to patient.  She has been experiencing cough and congestion sx's for 6 days now and she  Is feeling washed out.   Review of Systems C/o uri sx's   No chest pain, SOB, HA, dizziness, vision change, N/V, diarrhea, constipation, dysuria, urinary urgency or frequency, myalgias, arthralgias or rash.  Objective:   Physical Exam  Vital signs noted  Well developed well nourished female.  HEENT - Head atraumatic Normocephalic                Eyes - PERRLA, Conjuctiva - clear Sclera- Clear EOMI                Ears - EAC's Wnl TM's Wnl Gross Hearing WNL                Nose - Nares with decreased patency bilateral                Throat - oropharanx wnl Respiratory - Lungs CTA bilateral Cardiac - RRR S1 and S2 without murmur GI - Abdomen soft Nontender and bowel sounds active x 4       Assessment & Plan:  URI (upper respiratory infection) - Plan: amoxicillin (AMOXIL) 875 MG tablet Po bid x 10 days, mucinex otc, Push po fluids, rest, tylenol and motrin otc prn as directed for fever, arthralgias, and myalgias.  Follow up prn if sx's continue or persist.  Lysbeth Penner FNP

## 2013-05-30 NOTE — Addendum Note (Signed)
Addended by: Lysbeth Penner on: 05/30/2013 08:44 AM   Modules accepted: Orders

## 2013-06-02 ENCOUNTER — Telehealth: Payer: Self-pay | Admitting: Nurse Practitioner

## 2013-06-02 NOTE — Telephone Encounter (Signed)
Ok to give crestor samples

## 2013-06-02 NOTE — Telephone Encounter (Signed)
Pt notified no samples available 

## 2013-06-02 NOTE — Telephone Encounter (Signed)
Pt said she has been out of samples x 3 weeks and said if you want to call in a different medication that would be okay. Cant afford Crestor.

## 2013-06-04 ENCOUNTER — Ambulatory Visit (INDEPENDENT_AMBULATORY_CARE_PROVIDER_SITE_OTHER): Payer: Medicare Other | Admitting: Family Medicine

## 2013-06-04 VITALS — BP 142/79 | HR 80 | Temp 97.0°F | Ht 63.0 in | Wt 194.0 lb

## 2013-06-04 DIAGNOSIS — R059 Cough, unspecified: Secondary | ICD-10-CM | POA: Diagnosis not present

## 2013-06-04 DIAGNOSIS — R05 Cough: Secondary | ICD-10-CM | POA: Diagnosis not present

## 2013-06-04 DIAGNOSIS — J9801 Acute bronchospasm: Secondary | ICD-10-CM

## 2013-06-04 DIAGNOSIS — J209 Acute bronchitis, unspecified: Secondary | ICD-10-CM | POA: Diagnosis not present

## 2013-06-04 DIAGNOSIS — J069 Acute upper respiratory infection, unspecified: Secondary | ICD-10-CM

## 2013-06-04 NOTE — Patient Instructions (Addendum)
Drink plenty of fluids Take Tylenol for aches pains and fever Use an inhaler as directed, 2 puffs twice daily rinse mouth after use--this is the Symbicort 160/4.5 sample that I gave you in the office Continue to take and complete amoxicillin Purchase Nasacort or Flonase over-the-counter and use 2 sprays each nostril daily During the day ,use saline nose spray

## 2013-06-04 NOTE — Progress Notes (Signed)
   Subjective:    Patient ID: Veronica Paul, female    DOB: 12/17/1935, 78 y.o.   MRN: [id number]  Cough Associated symptoms include a fever and a sore throat. Pertinent negatives include no chills, ear pain, postnasal drip, rhinorrhea, shortness of breath or wheezing.    Patient is here today for a cough she has had for five days. Coughing up clear stuff  Review of Systems  Constitutional: Positive for fever. Negative for chills, diaphoresis, activity change, appetite change, fatigue and unexpected weight change.  HENT: Positive for congestion, sinus pressure, sneezing and sore throat. Negative for dental problem, drooling, ear discharge, ear pain, facial swelling, hearing loss, mouth sores, nosebleeds, postnasal drip, rhinorrhea, tinnitus, trouble swallowing and voice change.   Eyes: Negative.   Respiratory: Positive for cough. Negative for apnea, choking, chest tightness, shortness of breath, wheezing and stridor.   Cardiovascular: Negative.   Gastrointestinal: Negative.   Endocrine: Negative.   Genitourinary: Negative.   Musculoskeletal: Negative.   Skin: Negative.   Allergic/Immunologic: Negative.   Neurological: Negative.   Hematological: Negative.   Psychiatric/Behavioral: Negative.        Objective:   Physical Exam  Nursing note and vitals reviewed. Constitutional: She is oriented to person, place, and time. She appears well-developed and well-nourished. No distress.  HENT:  Head: Normocephalic and atraumatic.  Right Ear: External ear normal.  Left Ear: External ear normal.  Nose: Nose normal.  Mouth/Throat: Oropharynx is clear and moist. No oropharyngeal exudate.  Eyes: Conjunctivae and EOM are normal. Pupils are equal, round, and reactive to light. Right eye exhibits no discharge. Left eye exhibits no discharge. No scleral icterus.  Neck: Normal range of motion. Neck supple. No thyromegaly present.  Cardiovascular: Normal rate, regular rhythm and normal heart sounds.    No murmur heard. At 84 per minute  Pulmonary/Chest: Effort normal. No respiratory distress. She has wheezes. She has no rales.  Dry cough, scattered wheeze  Abdominal: Soft. Bowel sounds are normal. She exhibits no mass. There is no tenderness.  Musculoskeletal: Normal range of motion. She exhibits no edema.  Lymphadenopathy:    She has no cervical adenopathy.  Neurological: She is alert and oriented to person, place, and time.  Skin: Skin is warm and dry. No rash noted.  Psychiatric: She has a normal mood and affect. Her behavior is normal. Judgment and thought content normal.    BP 142/79  Pulse 80  Temp(Src) 97 F (36.1 C) (Oral)  Ht 5\' 3"  (1.6 m)  Wt 194 lb (87.998 kg)  BMI 34.37 kg/m2  LMP 05/11/1992      Assessment & Plan:   1. Acute bronchitis  2. URI (upper respiratory infection)  3. Cough  4. Bronchospasm Patient Instructions  Drink plenty of fluids Take Tylenol for aches pains and fever Use an inhaler as directed, 2 puffs twice daily rinse mouth after use--this is the Symbicort 160/4.5 sample that I gave you in the office Continue to take and complete amoxicillin Purchase Nasacort or Flonase over-the-counter and use 2 sprays each nostril daily During the day ,use saline nose spray   Arrie Senate MD

## 2013-06-04 NOTE — Addendum Note (Signed)
Addended by: Josephine Cables on: 06/04/2013 09:35 AM   Modules accepted: Orders

## 2013-06-06 MED ORDER — ATORVASTATIN CALCIUM 40 MG PO TABS: 40.0000 mg | ORAL_TABLET | Freq: Every day | ORAL | Status: DC

## 2013-06-06 NOTE — Telephone Encounter (Signed)
We are out of samples

## 2013-06-06 NOTE — Telephone Encounter (Signed)
Changed crestor to lipitor= rx sent to pharmacy

## 2013-06-07 ENCOUNTER — Ambulatory Visit (INDEPENDENT_AMBULATORY_CARE_PROVIDER_SITE_OTHER): Payer: Medicare Other

## 2013-06-07 ENCOUNTER — Ambulatory Visit (INDEPENDENT_AMBULATORY_CARE_PROVIDER_SITE_OTHER): Payer: Medicare Other | Admitting: Family Medicine

## 2013-06-07 VITALS — BP 122/68 | HR 98 | Temp 97.0°F | Ht 63.0 in | Wt 192.2 lb

## 2013-06-07 DIAGNOSIS — J069 Acute upper respiratory infection, unspecified: Secondary | ICD-10-CM

## 2013-06-07 DIAGNOSIS — R059 Cough, unspecified: Secondary | ICD-10-CM

## 2013-06-07 DIAGNOSIS — R05 Cough: Secondary | ICD-10-CM

## 2013-06-07 LAB — POCT CBC
Granulocyte percent: 68.3 %G (ref 37–80)
HCT, POC: 40.7 % (ref 37.7–47.9)
Hemoglobin: 13.1 g/dL (ref 12.2–16.2)
Lymph, poc: 2.5 (ref 0.6–3.4)
MCH, POC: 27.7 pg (ref 27–31.2)
MCHC: 32.2 g/dL (ref 31.8–35.4)
MCV: 85.9 fL (ref 80–97)
MPV: 7.5 fL (ref 0–99.8)
POC Granulocyte: 6.4 (ref 2–6.9)
POC LYMPH PERCENT: 26.6 %L (ref 10–50)
Platelet Count, POC: 284 10*3/uL (ref 142–424)
RBC: 4.7 M/uL (ref 4.04–5.48)
RDW, POC: 14.1 %
WBC: 9.4 10*3/uL (ref 4.6–10.2)

## 2013-06-07 MED ORDER — HYDROCODONE-HOMATROPINE 5-1.5 MG/5ML PO SYRP: 5.0000 mL | ORAL_SOLUTION | Freq: Three times a day (TID) | ORAL | Status: DC | PRN

## 2013-06-07 NOTE — Progress Notes (Signed)
   Subjective:    Patient ID: Veronica Paul, female    DOB: [birth year], 78 y.o.   MRN: [id number]  HPI This 78 y.o. female presents for evaluation of persistent cough for 2 weeks.  She has been using Robitussin DM, mucinex, flonase NS, and amoxicillin and she is still having cough.  She has worsening of cough when she gets up.   She had a cough spell this am and it eased off.  She is having a productive cough that is clear. She hears racket but no wheezing.  She denies fever.   Review of Systems C/o cough and uri sx's   No chest pain, SOB, HA, dizziness, vision change, N/V, diarrhea, constipation, dysuria, urinary urgency or frequency, myalgias, arthralgias or rash.  Objective:   Physical Exam Vital signs noted  Well developed well nourished female.  HEENT - Head atraumatic Normocephalic                Eyes - PERRLA, Conjuctiva - clear Sclera- Clear EOMI                Ears - EAC's Wnl TM's Wnl Gross Hearing WNL                Throat - oropharanx wnl Respiratory - Lungs CTA bilateral Cardiac - RRR S1 and S2 without murmur GI - Abdomen soft Nontender and bowel sounds active x 4 Extremities - No edema. Neuro - Grossly intact.  CXR - No infiltrates Prelimnary reading by Iverson Alamin     Results for orders placed in visit on 06/07/13  POCT CBC      Result Value Ref Range   WBC 9.4  4.6 - 10.2 K/uL   Lymph, poc 2.5  0.6 - 3.4   POC LYMPH PERCENT 26.6  10 - 50 %L   POC Granulocyte 6.4  2 - 6.9   Granulocyte percent 68.3  37 - 80 %G   RBC 4.7  4.04 - 5.48 M/uL   Hemoglobin 13.1  12.2 - 16.2 g/dL   HCT, POC 40.7  37.7 - 47.9 %   MCV 85.9  80 - 97 fL   MCH, POC 27.7  27 - 31.2 pg   MCHC 32.2  31.8 - 35.4 g/dL   RDW, POC 14.1     Platelet Count, POC 284.0  142 - 424 K/uL   MPV 7.5  0 - 99.8 fL   Assessment & Plan:  URI (upper respiratory infection) - Plan: HYDROcodone-homatropine (HYCODAN) 5-1.5 MG/5ML syrup, POCT CBC, DG Chest 2 View  Cough - Plan:  HYDROcodone-homatropine (HYCODAN) 5-1.5 MG/5ML syrup, POCT CBC, DG Chest 2 View  Push po fluids, rest, tylenol and motrin otc prn as directed for fever, arthralgias, and myalgias.  Follow up prn if sx's continue or persist.  Lysbeth Penner FNP

## 2013-06-13 ENCOUNTER — Other Ambulatory Visit: Payer: Self-pay | Admitting: Nurse Practitioner

## 2013-06-14 NOTE — Telephone Encounter (Signed)
Last seen 06/04/13 FP[facility]  No VIt D level in EPIC

## 2013-06-15 NOTE — Telephone Encounter (Signed)
Patient needs to be seen. Has exceeded time since last visit. Limited quantity refilled. Needs to bring all medications to next appointment.   

## 2013-06-20 ENCOUNTER — Other Ambulatory Visit: Payer: Self-pay | Admitting: Nurse Practitioner

## 2013-06-21 ENCOUNTER — Other Ambulatory Visit: Payer: Self-pay | Admitting: Nurse Practitioner

## 2013-06-22 NOTE — Telephone Encounter (Signed)
Last seen 05/23/13, last filled 05/27/13, route to pool to call into [facility]

## 2013-06-23 NOTE — Telephone Encounter (Signed)
rx ready for pick up rx needs to be picked up because can't be filled until 06/26/13

## 2013-06-23 NOTE — Telephone Encounter (Signed)
Patient aware rx ready to be picked up and that can not be filled until 5/10

## 2013-07-12 ENCOUNTER — Other Ambulatory Visit: Payer: Self-pay

## 2013-07-12 MED ORDER — VITAMIN D (ERGOCALCIFEROL) 1.25 MG (50000 UNIT) PO CAPS: ORAL_CAPSULE | ORAL | Status: DC

## 2013-07-12 NOTE — Telephone Encounter (Signed)
Last seen 06/07/13  [facility]  No Vit D level in EPIC

## 2013-07-23 ENCOUNTER — Encounter: Payer: Self-pay | Admitting: Family Medicine

## 2013-07-23 ENCOUNTER — Ambulatory Visit (INDEPENDENT_AMBULATORY_CARE_PROVIDER_SITE_OTHER): Payer: Medicare Other | Admitting: Family Medicine

## 2013-07-23 VITALS — BP 121/65 | HR 71 | Temp 97.0°F | Ht 63.0 in | Wt 191.0 lb

## 2013-07-23 DIAGNOSIS — J209 Acute bronchitis, unspecified: Secondary | ICD-10-CM

## 2013-07-23 MED ORDER — METHYLPREDNISOLONE ACETATE 80 MG/ML IJ SUSP
80.0000 mg | Freq: Once | INTRAMUSCULAR | Status: AC
  Administered 2013-07-23: 80 mg via INTRAMUSCULAR

## 2013-07-23 MED ORDER — AMOXICILLIN 875 MG PO TABS: 875.0000 mg | ORAL_TABLET | Freq: Two times a day (BID) | ORAL | Status: DC

## 2013-07-23 NOTE — Progress Notes (Signed)
   Subjective:    Patient ID: Veronica Paul, female    DOB: Nov 26, 1935, 78 y.o.   MRN: [id number]  HPI  This 78 y.o. female presents for evaluation of uri sx's and cough which is keeping her awake at night.  Review of Systems C/o uri sx's and cough   No chest pain, SOB, HA, dizziness, vision change, N/V, diarrhea, constipation, dysuria, urinary urgency or frequency, myalgias, arthralgias or rash.  Objective:   Physical Exam  Vital signs noted  Well developed well nourished female.  HEENT - Head atraumatic Normocephalic                Eyes - PERRLA, Conjuctiva - clear Sclera- Clear EOMI                Ears - EAC's Wnl TM's Wnl Gross Hearing WNL                Nose - Nares patent                 Throat - oropharanx wnl Respiratory - Lungs CTA bilateral Cardiac - RRR S1 and S2 without murmur GI - Abdomen soft Nontender and bowel sounds active x 4 Extremities - No edema. Neuro - Grossly intact.      Assessment & Plan:  Acute bronchitis - Plan: amoxicillin (AMOXIL) 875 MG tablet, methylPREDNISolone acetate (DEPO-MEDROL) injection 80 mg Push po fluids, rest, tylenol and motrin otc prn as directed for fever, arthralgias, and myalgias.  Follow up prn if sx's continue or persist.  Lysbeth Penner FNP

## 2013-08-01 ENCOUNTER — Other Ambulatory Visit: Payer: Self-pay | Admitting: Family Medicine

## 2013-08-02 DIAGNOSIS — Z1231 Encounter for screening mammogram for malignant neoplasm of breast: Secondary | ICD-10-CM | POA: Diagnosis not present

## 2013-08-04 NOTE — Telephone Encounter (Signed)
No vit D level since 2013

## 2013-08-04 NOTE — Telephone Encounter (Signed)
Please have patient come in and get a vitamin D. level

## 2013-08-15 ENCOUNTER — Other Ambulatory Visit: Payer: Self-pay | Admitting: Nurse Practitioner

## 2013-08-22 ENCOUNTER — Other Ambulatory Visit: Payer: Self-pay | Admitting: Family Medicine

## 2013-08-22 ENCOUNTER — Ambulatory Visit (INDEPENDENT_AMBULATORY_CARE_PROVIDER_SITE_OTHER): Payer: Medicare Other | Admitting: Nurse Practitioner

## 2013-08-22 ENCOUNTER — Encounter: Payer: Self-pay | Admitting: Nurse Practitioner

## 2013-08-22 VITALS — BP 120/72 | HR 64 | Temp 98.3°F | Ht 63.0 in | Wt 191.6 lb

## 2013-08-22 DIAGNOSIS — F329 Major depressive disorder, single episode, unspecified: Secondary | ICD-10-CM | POA: Diagnosis not present

## 2013-08-22 DIAGNOSIS — F32A Depression, unspecified: Secondary | ICD-10-CM

## 2013-08-22 DIAGNOSIS — E785 Hyperlipidemia, unspecified: Secondary | ICD-10-CM

## 2013-08-22 DIAGNOSIS — F3289 Other specified depressive episodes: Secondary | ICD-10-CM

## 2013-08-22 DIAGNOSIS — R609 Edema, unspecified: Secondary | ICD-10-CM

## 2013-08-22 DIAGNOSIS — Z6833 Body mass index (BMI) 33.0-33.9, adult: Secondary | ICD-10-CM

## 2013-08-22 DIAGNOSIS — E119 Type 2 diabetes mellitus without complications: Secondary | ICD-10-CM | POA: Diagnosis not present

## 2013-08-22 DIAGNOSIS — Z713 Dietary counseling and surveillance: Secondary | ICD-10-CM

## 2013-08-22 DIAGNOSIS — I10 Essential (primary) hypertension: Secondary | ICD-10-CM

## 2013-08-22 DIAGNOSIS — E669 Obesity, unspecified: Secondary | ICD-10-CM

## 2013-08-22 DIAGNOSIS — R635 Abnormal weight gain: Secondary | ICD-10-CM

## 2013-08-22 DIAGNOSIS — E039 Hypothyroidism, unspecified: Secondary | ICD-10-CM

## 2013-08-22 DIAGNOSIS — F411 Generalized anxiety disorder: Secondary | ICD-10-CM

## 2013-08-22 LAB — POCT GLYCOSYLATED HEMOGLOBIN (HGB A1C): Hemoglobin A1C: 6

## 2013-08-22 NOTE — Patient Instructions (Signed)

## 2013-08-22 NOTE — Progress Notes (Signed)
Subjective:    Patient ID: Veronica Paul, female    DOB: Feb 03, 1936, 77 y.o.   MRN: [id number]  Patient here today for follow up- doing well- no complaints- no changes since last visit  Thyroid Problem Presents for follow-up visit. Symptoms include weight loss. Patient reports no anxiety, constipation, diaphoresis, dry skin, fatigue, leg swelling, palpitations or visual change. The symptoms have been stable. Her past medical history is significant for diabetes and hyperlipidemia.  Diabetes She presents for her follow-up diabetic visit. She has type 2 diabetes mellitus. No MedicAlert identification noted. The initial diagnosis of diabetes was made 3 years ago. Her disease course has been stable. There are no hypoglycemic associated symptoms. Pertinent negatives for hypoglycemia include no headaches. Associated symptoms include chest pain and weight loss. Pertinent negatives for diabetes include no blurred vision, no fatigue, no foot paresthesias, no polydipsia, no polyphagia, no polyuria and no visual change. There are no hypoglycemic complications. Symptoms are stable. There are no diabetic complications. Risk factors for coronary artery disease include dyslipidemia, obesity, post-menopausal and hypertension. When asked about current treatments, none were reported. She is compliant with treatment all of the time. Her weight is stable. She is following a diabetic diet. When asked about meal planning, she reported none. She has not had a previous visit with a dietician. She rarely participates in exercise. There is no change in her home blood glucose trend. (Patient doesn't check blood sugars at home.) An ACE inhibitor/angiotensin II receptor blocker is not being taken. She does not see a podiatrist.Eye exam is current (October 2013).  Hypertension This is a chronic problem. The current episode started more than 1 year ago. The problem is unchanged. The problem is controlled. Associated symptoms include  chest pain and shortness of breath. Pertinent negatives include no blurred vision, headaches, malaise/fatigue, orthopnea, palpitations or peripheral edema. Agents associated with hypertension include thyroid hormones. Risk factors for coronary artery disease include obesity, post-menopausal state, dyslipidemia and diabetes mellitus. Past treatments include calcium channel blockers, diuretics, beta blockers and angiotensin blockers. The current treatment provides significant improvement. Compliance problems include exercise and diet.  Hypertensive end-organ damage includes a thyroid problem.  Hyperlipidemia The current episode started more than 1 year ago. The problem is controlled. Recent lipid tests were reviewed and are normal. Exacerbating diseases include diabetes and obesity. Associated symptoms include chest pain and shortness of breath. Pertinent negatives include no focal sensory loss, leg pain or myalgias. Current antihyperlipidemic treatment includes statins (patient has been out of crestor for over a week- can't afford to buy- usually gets samples.). The current treatment provides significant improvement of lipids. Compliance problems include adherence to exercise and adherence to diet.  Risk factors for coronary artery disease include diabetes mellitus, hypertension, obesity and post-menopausal.  GAD Just started on clonazepam at last visit has really help her c/o chest pain at last visit- SHe only takes as needed.  Review of Systems  Constitutional: Positive for weight loss. Negative for malaise/fatigue, diaphoresis and fatigue.  Eyes: Negative for blurred vision.  Respiratory: Positive for shortness of breath.   Cardiovascular: Positive for chest pain. Negative for palpitations and orthopnea.  Gastrointestinal: Negative for constipation.  Endocrine: Negative for polydipsia, polyphagia and polyuria.  Musculoskeletal: Negative for myalgias.  Neurological: Negative for headaches.  All other  systems reviewed and are negative.      Objective:   Physical Exam  Constitutional: She is oriented to person, place, and time. She appears well-developed and well-nourished.  HENT:  Nose: Nose  normal.  Mouth/Throat: Oropharynx is clear and moist.  Eyes: EOM are normal.  Neck: Trachea normal, normal range of motion and full passive range of motion without pain. Neck supple. No JVD present. Carotid bruit is not present. No thyromegaly present.  Cardiovascular: Normal rate, regular rhythm, normal heart sounds and intact distal pulses.  Exam reveals no gallop and no friction rub.   No murmur heard. Pulmonary/Chest: Effort normal and breath sounds normal.  Abdominal: Soft. Bowel sounds are normal. She exhibits no distension and no mass. There is no tenderness.  Musculoskeletal: Normal range of motion.  Lymphadenopathy:    She has no cervical adenopathy.  Neurological: She is alert and oriented to person, place, and time. She has normal reflexes.  Positive 4/4 monofilament bil feet.  Skin: Skin is warm and dry.  No callus formation bil feet  Psychiatric: She has a normal mood and affect. Her behavior is normal. Judgment and thought content normal.   BP 120/72  Pulse 64  Temp(Src) 98.3 F (36.8 C) (Oral)  Ht _0  (1.6 m)  Wt 191 lb 9.6 oz (86.909 kg)  BMI 33.95 kg/m2  LMP 05/11/1992  Results for orders placed in visit on 08/22/13  POCT GLYCOSYLATED HEMOGLOBIN (HGB A1C)      Result Value Ref Range   Hemoglobin A1C 6.0            Assessment & Plan:   1. Essential hypertension   2. Hypothyroidism, unspecified hypothyroidism type   3. Type II or unspecified type diabetes mellitus without mention of complication, not stated as uncontrolled   4. Depression   5. Dyslipidemia   6. GAD (generalized anxiety disorder)   7. Obesity   8. Peripheral edema   9. BMI 33.0-33.9,adult   10. Weight loss counseling, encounter for    Orders Placed This Encounter  Procedures  .  CMP14+EGFR  . NMR, lipoprofile  . Thyroid Panel With TSH  . POCT glycosylated hemoglobin (Hb A1C)    Labs pending Health maintenance reviewed Diet and exercise encouraged Continue all meds Follow up  In 3 months   [place], FNP

## 2013-08-23 ENCOUNTER — Other Ambulatory Visit: Payer: Self-pay | Admitting: Nurse Practitioner

## 2013-08-23 LAB — CMP14+EGFR
ALK PHOS: 72 IU/L (ref 39–117)
ALT: 15 IU/L (ref 0–32)
AST: 10 IU/L (ref 0–40)
Albumin/Globulin Ratio: 2.3 (ref 1.1–2.5)
Albumin: 4.3 g/dL (ref 3.5–4.8)
BUN / CREAT RATIO: 14 (ref 11–26)
BUN: 11 mg/dL (ref 8–27)
CHLORIDE: 105 mmol/L (ref 97–108)
CO2: 26 mmol/L (ref 18–29)
Calcium: 9.1 mg/dL (ref 8.7–10.3)
Creatinine, Ser: 0.77 mg/dL (ref 0.57–1.00)
GFR calc Af Amer: 87 mL/min/{1.73_m2} (ref >59)
GFR calc non Af Amer: 76 mL/min/{1.73_m2} (ref >59)
GLOBULIN, TOTAL: 1.9 g/dL (ref 1.5–4.5)
Glucose: 105 mg/dL — ABNORMAL HIGH (ref 65–99)
Potassium: 4.5 mmol/L (ref 3.5–5.2)
SODIUM: 143 mmol/L (ref 134–144)
Total Bilirubin: 0.4 mg/dL (ref 0.0–1.2)
Total Protein: 6.2 g/dL (ref 6.0–8.5)

## 2013-08-23 LAB — THYROID PANEL WITH TSH
Free Thyroxine Index: 2.8 (ref 1.2–4.9)
T3 UPTAKE RATIO: 30 % (ref 24–39)
T4 TOTAL: 9.4 ug/dL (ref 4.5–12.0)
TSH: 0.014 u[IU]/mL — ABNORMAL LOW (ref 0.450–4.500)

## 2013-08-23 LAB — NMR, LIPOPROFILE
Cholesterol: 131 mg/dL (ref 100–199)
HDL CHOLESTEROL BY NMR: 64 mg/dL (ref >39)
HDL Particle Number: 40.2 umol/L (ref >=30.5)
LDL PARTICLE NUMBER: 819 nmol/L (ref <1000)
LDL Size: 20.5 nm (ref >20.5)
LDLC SERPL CALC-MCNC: 55 mg/dL (ref 0–99)
Small LDL Particle Number: 409 nmol/L (ref <=527)
Triglycerides by NMR: 61 mg/dL (ref 0–149)

## 2013-08-23 MED ORDER — LEVOTHYROXINE SODIUM 100 MCG PO TABS: 100.0000 ug | ORAL_TABLET | Freq: Every day | ORAL | Status: DC

## 2013-08-24 NOTE — Telephone Encounter (Signed)
Patient last seen in office on 08-22-13. Rx last filled on 07-25-13 for #[phone]. Please advise. If approved please route to pool B so nurse can phone in to pharmacy

## 2013-08-25 NOTE — Telephone Encounter (Signed)
Please call in klonopin with 1 refills 

## 2013-08-25 NOTE — Telephone Encounter (Signed)
Called to [facility] 

## 2013-09-14 ENCOUNTER — Other Ambulatory Visit: Payer: Self-pay | Admitting: Nurse Practitioner

## 2013-09-19 ENCOUNTER — Other Ambulatory Visit: Payer: Self-pay | Admitting: Nurse Practitioner

## 2013-09-20 ENCOUNTER — Other Ambulatory Visit: Payer: Self-pay | Admitting: *Deleted

## 2013-09-29 ENCOUNTER — Telehealth: Payer: Self-pay | Admitting: Nurse Practitioner

## 2013-09-29 ENCOUNTER — Other Ambulatory Visit: Payer: Self-pay | Admitting: Family Medicine

## 2013-09-29 DIAGNOSIS — R7989 Other specified abnormal findings of blood chemistry: Secondary | ICD-10-CM

## 2013-09-29 NOTE — Telephone Encounter (Signed)
Patient aware just needed lab appointment went ahead and made

## 2013-10-03 ENCOUNTER — Other Ambulatory Visit (INDEPENDENT_AMBULATORY_CARE_PROVIDER_SITE_OTHER): Payer: Medicare Other

## 2013-10-03 DIAGNOSIS — R946 Abnormal results of thyroid function studies: Secondary | ICD-10-CM

## 2013-10-03 DIAGNOSIS — R7989 Other specified abnormal findings of blood chemistry: Secondary | ICD-10-CM

## 2013-10-04 ENCOUNTER — Other Ambulatory Visit: Payer: Self-pay | Admitting: Nurse Practitioner

## 2013-10-04 LAB — THYROID PANEL WITH TSH
Free Thyroxine Index: 3.1 (ref 1.2–4.9)
T3 Uptake Ratio: 29 % (ref 24–39)
T4, Total: 10.8 ug/dL (ref 4.5–12.0)
TSH: 0.022 u[IU]/mL — ABNORMAL LOW (ref 0.450–4.500)

## 2013-10-04 MED ORDER — LEVOTHYROXINE SODIUM 88 MCG PO TABS: 88.0000 ug | ORAL_TABLET | Freq: Every day | ORAL | Status: DC

## 2013-10-06 ENCOUNTER — Telehealth: Payer: Self-pay | Admitting: Nurse Practitioner

## 2013-10-06 MED ORDER — LEVOTHYROXINE SODIUM 88 MCG PO TABS: 88.0000 ug | ORAL_TABLET | Freq: Every day | ORAL | Status: DC

## 2013-10-06 NOTE — Telephone Encounter (Signed)
Patient aware rx re sent

## 2013-10-15 ENCOUNTER — Other Ambulatory Visit: Payer: Self-pay | Admitting: Nurse Practitioner

## 2013-10-20 ENCOUNTER — Encounter: Payer: Self-pay | Admitting: *Deleted

## 2013-10-21 DIAGNOSIS — H40039 Anatomical narrow angle, unspecified eye: Secondary | ICD-10-CM | POA: Diagnosis not present

## 2013-10-21 DIAGNOSIS — E119 Type 2 diabetes mellitus without complications: Secondary | ICD-10-CM | POA: Diagnosis not present

## 2013-10-21 LAB — HM DIABETES EYE EXAM

## 2013-10-27 ENCOUNTER — Other Ambulatory Visit: Payer: Self-pay | Admitting: Family Medicine

## 2013-10-28 NOTE — Telephone Encounter (Signed)
No vit D labs in epic

## 2013-11-24 ENCOUNTER — Other Ambulatory Visit: Payer: Self-pay | Admitting: Nurse Practitioner

## 2013-11-30 ENCOUNTER — Ambulatory Visit: Payer: Medicare Other | Admitting: Nurse Practitioner

## 2013-12-02 ENCOUNTER — Encounter: Payer: Self-pay | Admitting: Nurse Practitioner

## 2013-12-02 ENCOUNTER — Ambulatory Visit (INDEPENDENT_AMBULATORY_CARE_PROVIDER_SITE_OTHER): Payer: Medicare Other | Admitting: Nurse Practitioner

## 2013-12-02 VITALS — BP 128/76 | HR 57 | Temp 97.0°F | Ht 63.0 in | Wt 196.2 lb

## 2013-12-02 DIAGNOSIS — E039 Hypothyroidism, unspecified: Secondary | ICD-10-CM

## 2013-12-02 DIAGNOSIS — F329 Major depressive disorder, single episode, unspecified: Secondary | ICD-10-CM

## 2013-12-02 DIAGNOSIS — E785 Hyperlipidemia, unspecified: Secondary | ICD-10-CM

## 2013-12-02 DIAGNOSIS — M8580 Other specified disorders of bone density and structure, unspecified site: Secondary | ICD-10-CM

## 2013-12-02 DIAGNOSIS — F411 Generalized anxiety disorder: Secondary | ICD-10-CM

## 2013-12-02 DIAGNOSIS — R609 Edema, unspecified: Secondary | ICD-10-CM

## 2013-12-02 DIAGNOSIS — E669 Obesity, unspecified: Secondary | ICD-10-CM

## 2013-12-02 DIAGNOSIS — R635 Abnormal weight gain: Secondary | ICD-10-CM

## 2013-12-02 DIAGNOSIS — E119 Type 2 diabetes mellitus without complications: Secondary | ICD-10-CM

## 2013-12-02 DIAGNOSIS — I10 Essential (primary) hypertension: Secondary | ICD-10-CM | POA: Diagnosis not present

## 2013-12-02 DIAGNOSIS — F32A Depression, unspecified: Secondary | ICD-10-CM

## 2013-12-02 DIAGNOSIS — Z23 Encounter for immunization: Secondary | ICD-10-CM

## 2013-12-02 LAB — POCT GLYCOSYLATED HEMOGLOBIN (HGB A1C): Hemoglobin A1C: 5.7

## 2013-12-02 NOTE — Patient Instructions (Signed)

## 2013-12-02 NOTE — Progress Notes (Signed)
Subjective:    Patient ID: Veronica Paul, female    DOB: [birth year], 78 y.o.   MRN: [id number]  Patient here today for follow up- doing well- no complaints- no changes since last visit  Diabetes She presents for her follow-up diabetic visit. She has type 2 diabetes mellitus. No MedicAlert identification noted. The initial diagnosis of diabetes was made 3 years ago. Her disease course has been stable. There are no hypoglycemic associated symptoms. Pertinent negatives for hypoglycemia include no headaches. Associated symptoms include chest pain and weight loss. Pertinent negatives for diabetes include no blurred vision, no fatigue, no foot paresthesias, no polydipsia, no polyphagia, no polyuria and no visual change. There are no hypoglycemic complications. Symptoms are stable. There are no diabetic complications. Risk factors for coronary artery disease include dyslipidemia, obesity, post-menopausal and hypertension. When asked about current treatments, none were reported. She is compliant with treatment all of the time. Her weight is stable. She is following a diabetic diet. When asked about meal planning, she reported none. She has not had a previous visit with a dietician. She rarely participates in exercise. There is no change in her home blood glucose trend. (Patient doesn't check blood sugars at home.) An ACE inhibitor/angiotensin II receptor blocker is not being taken. She does not see a podiatrist.Eye exam is current (October 2013).  Hypertension This is a chronic problem. The current episode started more than 1 year ago. The problem is unchanged. The problem is controlled. Associated symptoms include chest pain and shortness of breath. Pertinent negatives include no blurred vision, headaches, malaise/fatigue, orthopnea, palpitations or peripheral edema. Agents associated with hypertension include thyroid hormones. Risk factors for coronary artery disease include obesity, post-menopausal state,  dyslipidemia and diabetes mellitus. Past treatments include calcium channel blockers, diuretics, beta blockers and angiotensin blockers. The current treatment provides significant improvement. Compliance problems include exercise and diet.  Hypertensive end-organ damage includes a thyroid problem.  Thyroid Problem Presents for follow-up visit. Symptoms include weight loss. Patient reports no anxiety, constipation, diaphoresis, dry skin, fatigue, leg swelling, palpitations or visual change. The symptoms have been stable. Her past medical history is significant for diabetes and hyperlipidemia.  Hyperlipidemia The current episode started more than 1 year ago. The problem is controlled. Recent lipid tests were reviewed and are normal. Exacerbating diseases include diabetes and obesity. Associated symptoms include chest pain and shortness of breath. Pertinent negatives include no focal sensory loss, leg pain or myalgias. Current antihyperlipidemic treatment includes statins (patient has been out of crestor for over a week- can't afford to buy- usually gets samples.). The current treatment provides significant improvement of lipids. Compliance problems include adherence to exercise and adherence to diet.  Risk factors for coronary artery disease include diabetes mellitus, hypertension, obesity and post-menopausal.  GAD Just started on clonazepam at last visit has really help her c/o chest pain at last visit- SHe only takes as needed.  Review of Systems  Constitutional: Positive for weight loss. Negative for malaise/fatigue, diaphoresis and fatigue.  Eyes: Negative for blurred vision.  Respiratory: Positive for shortness of breath.   Cardiovascular: Positive for chest pain. Negative for palpitations and orthopnea.  Gastrointestinal: Negative for constipation.  Endocrine: Negative for polydipsia, polyphagia and polyuria.  Musculoskeletal: Negative for myalgias.  Neurological: Negative for headaches.  All  other systems reviewed and are negative.      Objective:   Physical Exam  Constitutional: She is oriented to person, place, and time. She appears well-developed and well-nourished.  HENT:  Nose: Nose  normal.  Mouth/Throat: Oropharynx is clear and moist.  Eyes: EOM are normal.  Neck: Trachea normal, normal range of motion and full passive range of motion without pain. Neck supple. No JVD present. Carotid bruit is not present. No thyromegaly present.  Cardiovascular: Normal rate, regular rhythm, normal heart sounds and intact distal pulses.  Exam reveals no gallop and no friction rub.   No murmur heard. Pulmonary/Chest: Effort normal and breath sounds normal.  Abdominal: Soft. Bowel sounds are normal. She exhibits no distension and no mass. There is no tenderness.  Musculoskeletal: Normal range of motion.  Lymphadenopathy:    She has no cervical adenopathy.  Neurological: She is alert and oriented to person, place, and time. She has normal reflexes.  Positive 4/4 monofilament bil feet.  Skin: Skin is warm and dry.  No callus formation bil feet  Psychiatric: She has a normal mood and affect. Her behavior is normal. Judgment and thought content normal.   BP 128/76  Pulse 57  Temp(Src) 97 F (36.1 C) (Oral)  Ht _0  (1.6 m)  Wt 196 lb 3.2 oz (88.996 kg)  BMI 34.76 kg/m2  LMP 05/11/1992  Results for orders placed in visit on 12/02/13  POCT GLYCOSYLATED HEMOGLOBIN (HGB A1C)      Result Value Ref Range   Hemoglobin A1C 5.7            Assessment & Plan:   1. Essential hypertension Low NA+ diet - CMP14+EGFR  2. Dyslipidemia Watch fats in diet - NMR, lipoprofile  3. Diabetes mellitus without complication Watch carbs - POCT glycosylated hemoglobin (Hb A1C)  4. Hypothyroidism, unspecified hypothyroidism type - Thyroid Panel With TSH  5. GAD (generalized anxiety disorder) Stress management  6. Peripheral edema Elevate legs when sitting  7. Obesity Discussed diet  and exercise for person with BMI >25 Will recheck weight in 3-6 months   8. Osteopenia Weight bearing exercises  9. Depression  Flu shot today Labs pending Health maintenance reviewed Diet and exercise encouraged Continue all meds Follow up  In 3 months   [place], FNP

## 2013-12-03 LAB — CMP14+EGFR
ALT: 15 IU/L (ref 0–32)
AST: 14 IU/L (ref 0–40)
Albumin/Globulin Ratio: 2 (ref 1.1–2.5)
Albumin: 4.4 g/dL (ref 3.5–4.8)
Alkaline Phosphatase: 61 IU/L (ref 39–117)
BUN / CREAT RATIO: 13 (ref 11–26)
BUN: 12 mg/dL (ref 8–27)
CO2: 28 mmol/L (ref 18–29)
Calcium: 9.3 mg/dL (ref 8.7–10.3)
Chloride: 99 mmol/L (ref 97–108)
Creatinine, Ser: 0.91 mg/dL (ref 0.57–1.00)
GFR, EST AFRICAN AMERICAN: 71 mL/min/{1.73_m2} (ref >59)
GFR, EST NON AFRICAN AMERICAN: 61 mL/min/{1.73_m2} (ref >59)
Globulin, Total: 2.2 g/dL (ref 1.5–4.5)
Glucose: 100 mg/dL — ABNORMAL HIGH (ref 65–99)
Potassium: 4 mmol/L (ref 3.5–5.2)
Sodium: 141 mmol/L (ref 134–144)
Total Bilirubin: 0.5 mg/dL (ref 0.0–1.2)
Total Protein: 6.6 g/dL (ref 6.0–8.5)

## 2013-12-03 LAB — NMR, LIPOPROFILE
Cholesterol: 128 mg/dL (ref 100–199)
HDL Cholesterol by NMR: 56 mg/dL (ref >39)
HDL PARTICLE NUMBER: 36.7 umol/L (ref >=30.5)
LDL Particle Number: 722 nmol/L (ref <1000)
LDL Size: 20.8 nm (ref >20.5)
LDLC SERPL CALC-MCNC: 49 mg/dL (ref 0–99)
LP-IR SCORE: 50 — AB (ref <=45)
Small LDL Particle Number: 347 nmol/L (ref <=527)
Triglycerides by NMR: 114 mg/dL (ref 0–149)

## 2013-12-03 LAB — THYROID PANEL WITH TSH
Free Thyroxine Index: 2.7 (ref 1.2–4.9)
T3 Uptake Ratio: 27 % (ref 24–39)
T4, Total: 10.1 ug/dL (ref 4.5–12.0)
TSH: 0.179 u[IU]/mL — AB (ref 0.450–4.500)

## 2013-12-04 ENCOUNTER — Other Ambulatory Visit: Payer: Self-pay | Admitting: Nurse Practitioner

## 2013-12-04 MED ORDER — LEVOTHYROXINE SODIUM 75 MCG PO TABS: 75.0000 ug | ORAL_TABLET | Freq: Every day | ORAL | Status: DC

## 2013-12-04 MED ORDER — LEVOTHYROXINE SODIUM 100 MCG PO TABS: 100.0000 ug | ORAL_TABLET | Freq: Every day | ORAL | Status: DC

## 2013-12-05 ENCOUNTER — Telehealth: Payer: Self-pay | Admitting: *Deleted

## 2013-12-05 NOTE — Telephone Encounter (Signed)
Aware. 

## 2013-12-05 NOTE — Telephone Encounter (Signed)
Message copied by Shelbie Ammons on Mon Dec 05, 2013  5:03 PM ------      Message from: Chevis Pretty      Created: Sun Dec 04, 2013  2:55 PM       Hgba1c discussed at appointment      Kidney and liver function stable      Cholesterol looks good      TSH low- had to decrease dose of levothyroxine- rx sent to pharmacy ------

## 2013-12-12 ENCOUNTER — Other Ambulatory Visit: Payer: Self-pay | Admitting: Family Medicine

## 2013-12-15 ENCOUNTER — Ambulatory Visit (INDEPENDENT_AMBULATORY_CARE_PROVIDER_SITE_OTHER): Payer: Medicare Other | Admitting: Family Medicine

## 2013-12-15 ENCOUNTER — Encounter: Payer: Self-pay | Admitting: Family Medicine

## 2013-12-15 VITALS — BP 133/64 | HR 74 | Temp 98.1°F | Ht 63.0 in | Wt 195.0 lb

## 2013-12-15 DIAGNOSIS — J0121 Acute recurrent ethmoidal sinusitis: Secondary | ICD-10-CM

## 2013-12-15 MED ORDER — AMOXICILLIN 875 MG PO TABS: 875.0000 mg | ORAL_TABLET | Freq: Two times a day (BID) | ORAL | Status: DC

## 2013-12-15 NOTE — Patient Instructions (Signed)

## 2013-12-15 NOTE — Progress Notes (Signed)
   Subjective:    Patient ID: Veronica Paul, female    DOB: [birth year], 78 y.o.   MRN: [id number]  HPI  78 year old female who presents with a three-day history of headache and facial pain sore throat and nasal and postnasal drainage. She has had minimal cough. She has had intermittent fevers. She has had the flu shot this year.    Review of Systems  HENT: Positive for congestion, sinus pressure and sore throat.   Eyes: Negative.   Respiratory: Negative.   Cardiovascular: Negative.   Gastrointestinal: Negative.   Genitourinary: Negative.   Neurological: Negative.        Objective:   Physical Exam  Constitutional: She appears well-developed.  HENT:  Sinuses are mildly tender to percussion  Tympanic membranes are dull gray  Neck: Normal range of motion.  Cardiovascular: Normal rate.   Pulmonary/Chest: Effort normal and breath sounds normal.  Neurological: She is alert.  Psychiatric: She has a normal mood and affect.    BP 133/64  Pulse 74  Temp(Src) 98.1 F (36.7 C) (Oral)  Ht 5\' 3"  (1.6 m)  Wt 195 lb (88.451 kg)  BMI 34.55 kg/m2  LMP 05/11/1992      Assessment & Plan:  1. Sinusitis: Would be more helpful with colored drainage sputum and duration of symptoms but she has a history of same and she believes this is what she has today. Begin amoxicillin 875 mg twice a day along with Mucinex, hydration, and hot showers to get the benefit of steam.

## 2014-01-03 ENCOUNTER — Ambulatory Visit (INDEPENDENT_AMBULATORY_CARE_PROVIDER_SITE_OTHER): Payer: Medicare Other | Admitting: Family Medicine

## 2014-01-03 ENCOUNTER — Encounter: Payer: Self-pay | Admitting: Family Medicine

## 2014-01-03 VITALS — BP 137/63 | HR 92 | Temp 97.6°F | Ht 63.0 in | Wt 193.0 lb

## 2014-01-03 DIAGNOSIS — J029 Acute pharyngitis, unspecified: Secondary | ICD-10-CM

## 2014-01-03 DIAGNOSIS — J0100 Acute maxillary sinusitis, unspecified: Secondary | ICD-10-CM

## 2014-01-03 DIAGNOSIS — R05 Cough: Secondary | ICD-10-CM

## 2014-01-03 DIAGNOSIS — R059 Cough, unspecified: Secondary | ICD-10-CM

## 2014-01-03 DIAGNOSIS — R509 Fever, unspecified: Secondary | ICD-10-CM | POA: Diagnosis not present

## 2014-01-03 LAB — POCT INFLUENZA A/B
Influenza A, POC: NEGATIVE
Influenza B, POC: NEGATIVE

## 2014-01-03 LAB — POCT RAPID STREP A (OFFICE): Rapid Strep A Screen: NEGATIVE

## 2014-01-03 MED ORDER — LEVOFLOXACIN 500 MG PO TABS: 500.0000 mg | ORAL_TABLET | Freq: Every day | ORAL | Status: DC

## 2014-01-03 MED ORDER — METHYLPREDNISOLONE (PAK) 4 MG PO TABS: ORAL_TABLET | ORAL | Status: DC

## 2014-01-03 NOTE — Progress Notes (Signed)
   Subjective:    Patient ID: Veronica Paul, female    DOB: Apr 26, 1935, 78 y.o.   MRN: [id number]  HPI Patient c/o uri sx's and cough.  She is having some fever and she is feeling weak.    Review of Systems  Constitutional: Negative for fever.  HENT: Negative for ear pain.   Eyes: Negative for discharge.  Respiratory: Negative for cough.   Cardiovascular: Negative for chest pain.  Gastrointestinal: Negative for abdominal distention.  Endocrine: Negative for polyuria.  Genitourinary: Negative for difficulty urinating.  Musculoskeletal: Negative for gait problem and neck pain.  Skin: Negative for color change and rash.  Neurological: Negative for speech difficulty and headaches.  Psychiatric/Behavioral: Negative for agitation.       Objective:    BP 137/63 mmHg  Pulse 92  Temp(Src) 97.6 F (36.4 C) (Oral)  Ht 5\' 3"  (1.6 m)  Wt 193 lb (87.544 kg)  BMI 34.20 kg/m2  LMP 05/11/1992 Physical Exam  Constitutional: She is oriented to person, place, and time. She appears well-developed and well-nourished.  HENT:  Head: Normocephalic and atraumatic.  Mouth/Throat: Oropharynx is clear and moist.  Eyes: Pupils are equal, round, and reactive to light.  Neck: Normal range of motion. Neck supple.  Cardiovascular: Normal rate and regular rhythm.   No murmur heard. Pulmonary/Chest: Effort normal and breath sounds normal.  Abdominal: Soft. Bowel sounds are normal. There is no tenderness.  Neurological: She is alert and oriented to person, place, and time.  Skin: Skin is warm and dry.  Psychiatric: She has a normal mood and affect.          Assessment & Plan:     ICD-9-CM ICD-10-CM   1. Sore throat 462 J02.9 POCT Influenza A/B     POCT rapid strep A     levofloxacin (LEVAQUIN) 500 MG tablet     methylPREDNIsolone (MEDROL DOSPACK) 4 MG tablet  2. Cough 786.2 R05 POCT Influenza A/B     POCT rapid strep A     levofloxacin (LEVAQUIN) 500 MG tablet     methylPREDNIsolone (MEDROL  DOSPACK) 4 MG tablet  3. Fever and chills 780.60 R50.9 POCT Influenza A/B     POCT rapid strep A     levofloxacin (LEVAQUIN) 500 MG tablet     methylPREDNIsolone (MEDROL DOSPACK) 4 MG tablet  4. Subacute maxillary sinusitis 461.0 J01.00 levofloxacin (LEVAQUIN) 500 MG tablet     methylPREDNIsolone (MEDROL DOSPACK) 4 MG tablet     No Follow-up on file.  Lysbeth Penner FNP

## 2014-01-16 ENCOUNTER — Other Ambulatory Visit: Payer: Self-pay | Admitting: Family Medicine

## 2014-01-27 ENCOUNTER — Ambulatory Visit: Payer: Medicare Other

## 2014-02-13 ENCOUNTER — Other Ambulatory Visit: Payer: Self-pay | Admitting: Family Medicine

## 2014-02-13 ENCOUNTER — Other Ambulatory Visit: Payer: Self-pay | Admitting: Nurse Practitioner

## 2014-02-15 ENCOUNTER — Encounter: Payer: Self-pay | Admitting: *Deleted

## 2014-02-25 ENCOUNTER — Other Ambulatory Visit: Payer: Self-pay | Admitting: Family Medicine

## 2014-02-25 ENCOUNTER — Other Ambulatory Visit: Payer: Self-pay | Admitting: Nurse Practitioner

## 2014-02-27 ENCOUNTER — Telehealth: Payer: Self-pay | Admitting: *Deleted

## 2014-02-27 NOTE — Telephone Encounter (Signed)
Last filled 08/24/13 with 1 RF, has appt with you 03/15/14. Call into [facility]

## 2014-02-27 NOTE — Telephone Encounter (Signed)
Script for klonipin called in.

## 2014-02-27 NOTE — Telephone Encounter (Signed)
Please call in klonopin with 0 refills 

## 2014-03-13 ENCOUNTER — Other Ambulatory Visit: Payer: Self-pay | Admitting: Nurse Practitioner

## 2014-03-15 ENCOUNTER — Encounter: Payer: Self-pay | Admitting: Nurse Practitioner

## 2014-03-15 ENCOUNTER — Ambulatory Visit (INDEPENDENT_AMBULATORY_CARE_PROVIDER_SITE_OTHER): Payer: Medicare Other | Admitting: Nurse Practitioner

## 2014-03-15 VITALS — BP 124/72 | HR 60 | Temp 97.0°F | Ht 63.0 in | Wt 194.0 lb

## 2014-03-15 DIAGNOSIS — I10 Essential (primary) hypertension: Secondary | ICD-10-CM | POA: Diagnosis not present

## 2014-03-15 DIAGNOSIS — F32A Depression, unspecified: Secondary | ICD-10-CM

## 2014-03-15 DIAGNOSIS — F411 Generalized anxiety disorder: Secondary | ICD-10-CM

## 2014-03-15 DIAGNOSIS — E119 Type 2 diabetes mellitus without complications: Secondary | ICD-10-CM | POA: Diagnosis not present

## 2014-03-15 DIAGNOSIS — R609 Edema, unspecified: Secondary | ICD-10-CM

## 2014-03-15 DIAGNOSIS — E039 Hypothyroidism, unspecified: Secondary | ICD-10-CM | POA: Diagnosis not present

## 2014-03-15 DIAGNOSIS — E785 Hyperlipidemia, unspecified: Secondary | ICD-10-CM

## 2014-03-15 DIAGNOSIS — F329 Major depressive disorder, single episode, unspecified: Secondary | ICD-10-CM

## 2014-03-15 DIAGNOSIS — Z6834 Body mass index (BMI) 34.0-34.9, adult: Secondary | ICD-10-CM

## 2014-03-15 LAB — POCT UA - MICROALBUMIN: MICROALBUMIN (UR) POC: 100 mg/L

## 2014-03-15 LAB — POCT GLYCOSYLATED HEMOGLOBIN (HGB A1C): Hemoglobin A1C: 5.9

## 2014-03-15 MED ORDER — SIMVASTATIN 40 MG PO TABS: 40.0000 mg | ORAL_TABLET | Freq: Every day | ORAL | Status: DC

## 2014-03-15 MED ORDER — NITROGLYCERIN 0.4 MG SL SUBL: 0.4000 mg | SUBLINGUAL_TABLET | SUBLINGUAL | Status: DC | PRN

## 2014-03-15 MED ORDER — LEVOTHYROXINE SODIUM 88 MCG PO TABS: 88.0000 ug | ORAL_TABLET | Freq: Every day | ORAL | Status: DC

## 2014-03-15 MED ORDER — METFORMIN HCL 500 MG PO TABS: ORAL_TABLET | ORAL | Status: DC

## 2014-03-15 NOTE — Addendum Note (Signed)
Addended by: Pollyann Kennedy F on: 03/15/2014 10:46 AM   Modules accepted: Orders

## 2014-03-15 NOTE — Progress Notes (Addendum)
Subjective:    Patient ID: Veronica Paul, female    DOB: 02-11-1936, 79 y.o.   MRN: [id number]  Patient here today for follow up- doing well- no complaints- no changes since last visit  Diabetes She presents for her follow-up diabetic visit. She has type 2 diabetes mellitus. No MedicAlert identification noted. There are no hypoglycemic associated symptoms. Pertinent negatives for hypoglycemia include no headaches. Associated symptoms include chest pain. Pertinent negatives for diabetes include no fatigue, no polydipsia, no polyphagia, no polyuria and no visual change. Symptoms are stable. There are no diabetic complications. Risk factors for coronary artery disease include diabetes mellitus, dyslipidemia, hypertension, obesity and post-menopausal. When asked about current treatments, none were reported. Her weight is stable. She is following a generally healthy diet. When asked about meal planning, she reported none. She has not had a previous visit with a dietitian. She rarely participates in exercise. (Patient does not check blood sugars at home) An ACE inhibitor/angiotensin II receptor blocker is being taken. She does not see a podiatrist.Eye exam is current.  Hypertension This is a chronic problem. The current episode started more than 1 year ago. The problem is controlled. Associated symptoms include chest pain and shortness of breath. Pertinent negatives include no headaches or palpitations. Risk factors for coronary artery disease include diabetes mellitus, dyslipidemia, obesity, post-menopausal state and sedentary lifestyle. Past treatments include angiotensin blockers and calcium channel blockers. The current treatment provides moderate improvement. Compliance problems include diet and exercise.  Hypertensive end-organ damage includes a thyroid problem.  Thyroid Problem Visit type: hypothyroidism. Patient reports no constipation, diaphoresis, fatigue, palpitations or visual change. The symptoms  have been stable. Her past medical history is significant for hyperlipidemia.  Hyperlipidemia This is a chronic problem. The current episode started more than 1 year ago. The problem is uncontrolled. Recent lipid tests were reviewed and are high. Associated symptoms include chest pain and shortness of breath. Pertinent negatives include no myalgias. Current antihyperlipidemic treatment includes statins (lipitor is causing legs to hurt). The current treatment provides moderate improvement of lipids. Compliance problems include adherence to diet and adherence to exercise.   GAD Just started on clonazepam at last visit has really help her c/o chest pain at last visit- SHe only takes as needed.  Review of Systems  Constitutional: Negative for diaphoresis and fatigue.  Respiratory: Positive for shortness of breath.   Cardiovascular: Positive for chest pain. Negative for palpitations.  Gastrointestinal: Negative for constipation.  Endocrine: Negative for polydipsia, polyphagia and polyuria.  Musculoskeletal: Negative for myalgias.  Neurological: Negative for headaches.  All other systems reviewed and are negative.      Objective:   Physical Exam  Constitutional: She is oriented to person, place, and time. She appears well-developed and well-nourished.  HENT:  Nose: Nose normal.  Mouth/Throat: Oropharynx is clear and moist.  Eyes: EOM are normal.  Neck: Trachea normal, normal range of motion and full passive range of motion without pain. Neck supple. No JVD present. Carotid bruit is not present. No thyromegaly present.  Cardiovascular: Normal rate, regular rhythm, normal heart sounds and intact distal pulses.  Exam reveals no gallop and no friction rub.   No murmur heard. Pulmonary/Chest: Effort normal and breath sounds normal.  Abdominal: Soft. Bowel sounds are normal. She exhibits no distension and no mass. There is no tenderness.  Musculoskeletal: Normal range of motion.  Lymphadenopathy:     She has no cervical adenopathy.  Neurological: She is alert and oriented to person, place, and time.  She has normal reflexes.  Positive 4/4 monofilament bil feet.  Skin: Skin is warm and dry.  No callus formation bil feet  Psychiatric: She has a normal mood and affect. Her behavior is normal. Judgment and thought content normal.   BP 124/72 mmHg  Pulse 60  Temp(Src) 97 F (36.1 C) (Oral)  Ht '5\' 3"'  (1.6 m)  Wt 194 lb (87.998 kg)  BMI 34.37 kg/m2  LMP 05/11/1992  Results for orders placed or performed in visit on 03/15/14  POCT glycosylated hemoglobin (Hb A1C)  Result Value Ref Range   Hemoglobin A1C 5.9           Assessment & Plan:   1. Essential hypertension Do not add salt to deit - CMP14+EGFR - nitroGLYCERIN (NITROSTAT) 0.4 MG SL tablet; Place 1 tablet (0.4 mg total) under the tongue every 5 (five) minutes as needed for chest pain.  Dispense: 30 tablet; Refill: 0  2. Dyslipidemia Low fat diet Changed lipitor to zocor due to muscle aches - NMR, lipoprofile - simvastatin (ZOCOR) 40 MG tablet; Take 1 tablet (40 mg total) by mouth at bedtime.  Dispense: 90 tablet; Refill: 1  3. Diabetes mellitus without complication Continue to watch carbs in diet - POCT glycosylated hemoglobin (Hb A1C) - POCT UA - Microalbumin - metFORMIN (GLUCOPHAGE) 500 MG tablet; TAKE (1) TABLET TWICE A DAY WITH MEALS (BREAKFAST AND SUPPER)  Dispense: 180 tablet; Refill: 1  4. Hypothyroidism, unspecified hypothyroidism type - levothyroxine (SYNTHROID, LEVOTHROID) 88 MCG tablet; Take 1 tablet (88 mcg total) by mouth daily before breakfast.  Dispense: 90 tablet; Refill: 1  5. Peripheral edema Elevate legs when sitting  6. GAD (generalized anxiety disorder) Stress management  7. Depression  8. BMI 34.0-34.9,adult Discussed diet and exercise for person with BMI >25 Will recheck weight in 3-6 months     Labs pending Health maintenance reviewed Diet and exercise encouraged Continue all  meds Follow up  In 3 months   [place], FNP

## 2014-03-15 NOTE — Patient Instructions (Signed)
Diabetes and Foot Care Diabetes may cause you to have problems because of poor blood supply (circulation) to your feet and legs. This may cause the skin on your feet to become thinner, break easier, and heal more slowly. Your skin may become dry, and the skin may peel and crack. You may also have nerve damage in your legs and feet causing decreased feeling in them. You may not notice minor injuries to your feet that could lead to infections or more serious problems. Taking care of your feet is one of the most important things you can do for yourself.  HOME CARE INSTRUCTIONS  Wear shoes at all times, even in the house. Do not go barefoot. Bare feet are easily injured.  Check your feet daily for blisters, cuts, and redness. If you cannot see the bottom of your feet, use a mirror or ask someone for help.  Wash your feet with warm water (do not use hot water) and mild soap. Then pat your feet and the areas between your toes until they are completely dry. Do not soak your feet as this can dry your skin.  Apply a moisturizing lotion or petroleum jelly (that does not contain alcohol and is unscented) to the skin on your feet and to dry, brittle toenails. Do not apply lotion between your toes.  Trim your toenails straight across. Do not dig under them or around the cuticle. File the edges of your nails with an emery board or nail file.  Do not cut corns or calluses or try to remove them with medicine.  Wear clean socks or stockings every day. Make sure they are not too tight. Do not wear knee-high stockings since they may decrease blood flow to your legs.  Wear shoes that fit properly and have enough cushioning. To break in new shoes, wear them for just a few hours a day. This prevents you from injuring your feet. Always look in your shoes before you put them on to be sure there are no objects inside.  Do not cross your legs. This may decrease the blood flow to your feet.  If you find a minor scrape,  cut, or break in the skin on your feet, keep it and the skin around it clean and dry. These areas may be cleansed with mild soap and water. Do not cleanse the area with peroxide, alcohol, or iodine.  When you remove an adhesive bandage, be sure not to damage the skin around it.  If you have a wound, look at it several times a day to make sure it is healing.  Do not use heating pads or hot water bottles. They may burn your skin. If you have lost feeling in your feet or legs, you may not know it is happening until it is too late.  Make sure your health care provider performs a complete foot exam at least annually or more often if you have foot problems. Report any cuts, sores, or bruises to your health care provider immediately. SEEK MEDICAL CARE IF:   You have an injury that is not healing.  You have cuts or breaks in the skin.  You have an ingrown nail.  You notice redness on your legs or feet.  You feel burning or tingling in your legs or feet.  You have pain or cramps in your legs and feet.  Your legs or feet are numb.  Your feet always feel cold. SEEK IMMEDIATE MEDICAL CARE IF:   There is increasing redness,   swelling, or pain in or around a wound.  There is a red line that goes up your leg.  Pus is coming from a wound.  You develop a fever or as directed by your health care provider.  You notice a bad smell coming from an ulcer or wound. Document Released: 02/01/2000 Document Revised: 10/06/2012 Document Reviewed: 07/13/2012 ExitCare Patient Information 2015 [employer]. This information is not intended to replace advice given to you by your health care provider. Make sure you discuss any questions you have with your health care provider.  

## 2014-03-16 LAB — NMR, LIPOPROFILE
Cholesterol: 126 mg/dL (ref 100–199)
HDL Cholesterol by NMR: 51 mg/dL (ref >39)
HDL PARTICLE NUMBER: 37.2 umol/L (ref >=30.5)
LDL PARTICLE NUMBER: 739 nmol/L (ref <1000)
LDL SIZE: 20.6 nm (ref >20.5)
LDL-C: 56 mg/dL (ref 0–99)
LP-IR SCORE: 52 — AB (ref <=45)
Small LDL Particle Number: 455 nmol/L (ref <=527)
TRIGLYCERIDES BY NMR: 93 mg/dL (ref 0–149)

## 2014-03-16 LAB — CMP14+EGFR
A/G RATIO: 2.1 (ref 1.1–2.5)
ALT: 11 IU/L (ref 0–32)
AST: 12 IU/L (ref 0–40)
Albumin: 4.5 g/dL (ref 3.5–4.8)
Alkaline Phosphatase: 54 IU/L (ref 39–117)
BUN/Creatinine Ratio: 20 (ref 11–26)
BUN: 17 mg/dL (ref 8–27)
CALCIUM: 9.3 mg/dL (ref 8.7–10.3)
CO2: 29 mmol/L (ref 18–29)
CREATININE: 0.86 mg/dL (ref 0.57–1.00)
Chloride: 100 mmol/L (ref 97–108)
GFR calc Af Amer: 76 mL/min/{1.73_m2} (ref >59)
GFR calc non Af Amer: 66 mL/min/{1.73_m2} (ref >59)
Globulin, Total: 2.1 g/dL (ref 1.5–4.5)
Glucose: 113 mg/dL — ABNORMAL HIGH (ref 65–99)
Potassium: 4.8 mmol/L (ref 3.5–5.2)
Sodium: 142 mmol/L (ref 134–144)
TOTAL PROTEIN: 6.6 g/dL (ref 6.0–8.5)
Total Bilirubin: 0.7 mg/dL (ref 0.0–1.2)

## 2014-03-16 LAB — MICROALBUMIN, URINE: Microalbumin, Urine: 35.7 ug/mL — ABNORMAL HIGH (ref 0.0–17.0)

## 2014-03-18 ENCOUNTER — Other Ambulatory Visit: Payer: Self-pay | Admitting: Nurse Practitioner

## 2014-03-20 ENCOUNTER — Other Ambulatory Visit: Payer: Self-pay | Admitting: Nurse Practitioner

## 2014-03-20 MED ORDER — CLONAZEPAM 0.5 MG PO TABS: ORAL_TABLET | ORAL | Status: DC

## 2014-03-20 NOTE — Telephone Encounter (Signed)
Please call in klonopin with 1 refills 

## 2014-03-20 NOTE — Telephone Encounter (Signed)
Please advise on refill.  Last seen by [facility] 03/15/14.

## 2014-03-21 NOTE — Telephone Encounter (Signed)
rx called into pharmacy

## 2014-04-22 DIAGNOSIS — L0390 Cellulitis, unspecified: Secondary | ICD-10-CM | POA: Diagnosis not present

## 2014-05-03 ENCOUNTER — Encounter: Payer: Self-pay | Admitting: Family Medicine

## 2014-05-03 ENCOUNTER — Ambulatory Visit (INDEPENDENT_AMBULATORY_CARE_PROVIDER_SITE_OTHER): Payer: Medicare Other | Admitting: Family Medicine

## 2014-05-03 VITALS — BP 108/61 | HR 68 | Temp 97.8°F | Ht 63.0 in | Wt 196.0 lb

## 2014-05-03 DIAGNOSIS — L270 Generalized skin eruption due to drugs and medicaments taken internally: Secondary | ICD-10-CM | POA: Diagnosis not present

## 2014-05-03 MED ORDER — ONETOUCH ULTRASOFT LANCETS MISC: Status: DC

## 2014-05-03 MED ORDER — GLUCOSE BLOOD VI STRP: ORAL_STRIP | Status: DC

## 2014-05-03 MED ORDER — PREDNISONE 10 MG PO TABS: ORAL_TABLET | ORAL | Status: DC

## 2014-05-03 MED ORDER — METHYLPREDNISOLONE ACETATE 80 MG/ML IJ SUSP
40.0000 mg | Freq: Once | INTRAMUSCULAR | Status: AC
  Administered 2014-05-03: 40 mg via INTRAMUSCULAR

## 2014-05-03 NOTE — Patient Instructions (Addendum)
Only use unscented fabric softener unscented detergent and scent free soaps Please note in your medical records that you are allergic to sulfa Use Benadryl over-the-counter 25 mg if needed for itching Rewash ( bed linen clothing etc. with scent free detergent and fabric softener Take prednisone as directed and watch blood sugars closely for the next few daysMethylprednisolone Suspension for Injection What is this medicine? METHYLPREDNISOLONE (meth ill pred NISS oh lone) is a corticosteroid. It is commonly used to treat inflammation of the skin, joints, lungs, and other organs. Common conditions treated include asthma, allergies, and arthritis. It is also used for other conditions, such as blood disorders and diseases of the adrenal glands. This medicine may be used for other purposes; ask your health care provider or pharmacist if you have questions. COMMON BRAND NAME(S): Depmedalone-40, Depmedalone-80, Depo-Medrol What should I tell my health care provider before I take this medicine? They need to know if you have any of these conditions: -cataracts or glaucoma -Cushings -heart disease -high blood pressure -infection including tuberculosis -low calcium or potassium levels in the blood -recent surgery -seizures -stomach or intestinal disease, including colitis -threadworms -thyroid problems -an unusual or allergic reaction to methylprednisolone, corticosteroids, benzyl alcohol, other medicines, foods, dyes, or preservatives -pregnant or trying to get pregnant -breast-feeding How should I use this medicine? This medicine is for injection into a muscle, joint, or other tissue. It is given by a health care professional in a hospital or clinic setting. Talk to your pediatrician regarding the use of this medicine in children. While this drug may be prescribed for selected conditions, precautions do apply. Overdosage: If you think you have taken too much of this medicine contact a poison  control center or emergency room at once. NOTE: This medicine is only for you. Do not share this medicine with others. What if I miss a dose? This does not apply. What may interact with this medicine? Do not take this medicine with any of the following medications: -mifepristone This medicine may also interact with the following medications: -aspirin and aspirin-like medicines -cyclosporin -ketoconazole -phenobarbital -phenytoin -rifampin -tacrolimus -troleandomycin -vaccines -warfarin This list may not describe all possible interactions. Give your health care provider a list of all the medicines, herbs, non-prescription drugs, or dietary supplements you use. Also tell them if you smoke, drink alcohol, or use illegal drugs. Some items may interact with your medicine. What should I watch for while using this medicine? Visit your doctor or health care professional for regular checks on your progress. If you are taking this medicine for a long time, carry an identification card with your name and address, the type and dose of your medicine, and your doctor's name and address. The medicine may increase your risk of getting an infection. Stay away from people who are sick. Tell your doctor or health care professional if you are around anyone with measles or chickenpox. You may need to avoid some vaccines. Talk to your health care provider for more information. If you are going to have surgery, tell your doctor or health care professional that you have taken this medicine within the last twelve months. Ask your doctor or health care professional about your diet. You may need to lower the amount of salt you eat. The medicine can increase your blood sugar. If you are a diabetic check with your doctor if you need help adjusting the dose of your diabetic medicine. What side effects may I notice from receiving this medicine? Side effects that you should  report to your doctor or health care  professional as soon as possible: -allergic reactions like skin rash, itching or hives, swelling of the face, lips, or tongue -bloody or tarry stools -changes in vision -eye pain or bulging eyes -fever, sore throat, sneezing, cough, or other signs of infection, wounds that will not heal -increased thirst -irregular heartbeat -muscle cramps -pain in hips, back, ribs, arms, shoulders, or legs -swelling of the ankles, feet, hands -trouble passing urine or change in the amount of urine -unusual bleeding or bruising -unusually weak or tired -weight gain or weight loss Side effects that usually do not require medical attention (report to your doctor or health care professional if they continue or are bothersome): -changes in emotions or moods -constipation or diarrhea -headache -irritation at site where injected -nausea, vomiting -skin problems, acne, thin and shiny skin -trouble sleeping -unusual hair growth on the face or body This list may not describe all possible side effects. Call your doctor for medical advice about side effects. You may report side effects to FDA at 1-800-FDA-1088. Where should I keep my medicine? This drug is given in a hospital or clinic and will not be stored at home. NOTE: This sheet is a summary. It may not cover all possible information. If you have questions about this medicine, talk to your doctor, pharmacist, or health care provider.  2015, Elsevier/Gold Standard. (2011-11-04 11:37:56)

## 2014-05-03 NOTE — Progress Notes (Signed)
Depomedrol 40mg  given im Right upper outer quadrant gluteal. Pt tolerated well

## 2014-05-03 NOTE — Progress Notes (Signed)
Subjective:    Patient ID: Veronica Paul, female    DOB: Jun 28, 1935, 79 y.o.   MRN: [id number]  HPI Patient here today for rash that is all over and was first noticed yesterday. She states she has not used anything new as far of soaps and detergents. However, she is using scented soap, scented fabric softener, and Wisk detergent. Most importantly about 10 days ago she went to the urgent care center and she was treated for a bug bite on her left thigh with sulfa. She just completed the sulfa yesterday and the rash started yesterday and it is a pruritic rash over most of her body         Patient Active Problem List   Diagnosis Date Noted  . Hypothyroidism 05/23/2013  . Diabetes mellitus without complication 12/45/8099  . GAD (generalized anxiety disorder) 02/09/2013  . Depression 06/25/2012  . Peripheral edema 06/25/2012  . Osteopenia   . HTN (hypertension)   . Abnormal EKG   . Dyslipidemia   . BMI 34.0-34.9,adult    Outpatient Encounter Prescriptions as of 05/03/2014  Medication Sig  . amLODipine (NORVASC) 5 MG tablet TAKE 1 TABLET DAILY  . aspirin EC 81 MG EC tablet Take 81 mg by mouth daily.    Marland Kitchen BLACK COHOSH PO Take by mouth.    . clonazePAM (KLONOPIN) 0.5 MG tablet TAKE 1 TABLET TWICE DAILY AS NEEDED FOR ANXIETY  . fish oil-omega-3 fatty acids 1000 MG capsule Take 2 g by mouth daily.    . furosemide (LASIX) 40 MG tablet TAKE 1 TABLET DAILY  . levothyroxine (SYNTHROID, LEVOTHROID) 88 MCG tablet Take 1 tablet (88 mcg total) by mouth daily before breakfast.  . losartan (COZAAR) 100 MG tablet TAKE 1 TABLET DAILY  . metFORMIN (GLUCOPHAGE) 500 MG tablet TAKE (1) TABLET TWICE A DAY WITH MEALS (BREAKFAST AND SUPPER)  . metoprolol (TOPROL-XL) 200 MG 24 hr tablet TAKE 1 & 1/2 TABLETS ONCE DAILY  . nitroGLYCERIN (NITROSTAT) 0.4 MG SL tablet Place 1 tablet (0.4 mg total) under the tongue every 5 (five) minutes as needed for chest pain.  . simvastatin (ZOCOR) 40 MG tablet Take 1  tablet (40 mg total) by mouth at bedtime.  . Vitamin D, Ergocalciferol, (DRISDOL) 50000 UNITS CAPS capsule TAKE ONE CAPSULE BY MOUTH WEEKLY    Review of Systems  Constitutional: Negative.   HENT: Negative.   Eyes: Negative.   Respiratory: Negative.   Cardiovascular: Negative.   Gastrointestinal: Negative.   Endocrine: Negative.   Genitourinary: Negative.   Musculoskeletal: Negative.   Skin: Positive for rash (itches).  Allergic/Immunologic: Negative.   Neurological: Negative.   Hematological: Negative.   Psychiatric/Behavioral: Negative.        Objective:   Physical Exam  Constitutional: She is oriented to person, place, and time. She appears well-developed and well-nourished. No distress.  HENT:  Head: Normocephalic.  Musculoskeletal: Normal range of motion.  Neurological: She is alert and oriented to person, place, and time.  Skin: Skin is warm and dry. Rash noted. There is erythema.  The patient has a diffuse pruritic rash over all of her body that is erythematous.  Psychiatric: She has a normal mood and affect. Her behavior is normal. Judgment and thought content normal.  Nursing note and vitals reviewed.  BP 108/61 mmHg  Pulse 68  Temp(Src) 97.8 F (36.6 C) (Oral)  Ht 5\' 3"  (1.6 m)  Wt 196 lb (88.905 kg)  BMI 34.73 kg/m2  LMP 05/11/1992  Assessment & Plan:  1. Drug rash -Also 40 mg of Depo-Medrol IM - predniSONE (DELTASONE) 10 MG tablet; 1 tablet 4 times a day for 2 days,  -The patient was also told that she should use scent free fabric softeners detergents and soaps 1 tablet 3 times a day for 2 days,  1 tablet 2 times a day for 2 days, 1 tablet daily for 2 days  Dispense: 20 tablet; Refill: 0 Patient will check her blood sugars more frequently and we will give her a monitor to do this and she was told that if the blood sugar goes up and she could take an extra half of a metformin for a day or 2 to bring the sugar back down  Patient Instructions    Only use unscented fabric softener unscented detergent and scent free soaps Please note in your medical records that you are allergic to sulfa Use Benadryl over-the-counter 25 mg if needed for itching Rewash ( bed linen clothing etc. with scent free detergent and fabric softener Take prednisone as directed and watch blood sugars closely for the next few days   Arrie Senate MD

## 2014-05-15 ENCOUNTER — Other Ambulatory Visit: Payer: Self-pay | Admitting: Nurse Practitioner

## 2014-05-16 NOTE — Telephone Encounter (Signed)
Last seen 05/03/14 [facility]  Do not see a VIT D level in EPIC

## 2014-05-23 ENCOUNTER — Other Ambulatory Visit: Payer: Self-pay | Admitting: Nurse Practitioner

## 2014-05-24 NOTE — Telephone Encounter (Signed)
rx called into pharmacy

## 2014-05-24 NOTE — Telephone Encounter (Signed)
Please advise on refill.  Last seen by Dr. Laurance Flatten 05/03/14, has follow up appointment with [facility] 06/21/14.

## 2014-05-24 NOTE — Telephone Encounter (Signed)
Please call in klonopin with 1 refills 

## 2014-06-13 ENCOUNTER — Other Ambulatory Visit: Payer: Self-pay | Admitting: Nurse Practitioner

## 2014-06-13 ENCOUNTER — Other Ambulatory Visit: Payer: Self-pay | Admitting: Family Medicine

## 2014-06-21 ENCOUNTER — Encounter: Payer: Self-pay | Admitting: Nurse Practitioner

## 2014-06-21 ENCOUNTER — Ambulatory Visit (INDEPENDENT_AMBULATORY_CARE_PROVIDER_SITE_OTHER): Payer: Medicare Other | Admitting: Nurse Practitioner

## 2014-06-21 VITALS — BP 129/76 | HR 60 | Temp 96.8°F | Ht 63.0 in | Wt 197.0 lb

## 2014-06-21 DIAGNOSIS — E785 Hyperlipidemia, unspecified: Secondary | ICD-10-CM

## 2014-06-21 DIAGNOSIS — F411 Generalized anxiety disorder: Secondary | ICD-10-CM | POA: Diagnosis not present

## 2014-06-21 DIAGNOSIS — E119 Type 2 diabetes mellitus without complications: Secondary | ICD-10-CM

## 2014-06-21 DIAGNOSIS — E039 Hypothyroidism, unspecified: Secondary | ICD-10-CM

## 2014-06-21 DIAGNOSIS — Z6834 Body mass index (BMI) 34.0-34.9, adult: Secondary | ICD-10-CM | POA: Diagnosis not present

## 2014-06-21 DIAGNOSIS — R609 Edema, unspecified: Secondary | ICD-10-CM

## 2014-06-21 DIAGNOSIS — F329 Major depressive disorder, single episode, unspecified: Secondary | ICD-10-CM

## 2014-06-21 DIAGNOSIS — I10 Essential (primary) hypertension: Secondary | ICD-10-CM | POA: Diagnosis not present

## 2014-06-21 DIAGNOSIS — F32A Depression, unspecified: Secondary | ICD-10-CM

## 2014-06-21 LAB — POCT GLYCOSYLATED HEMOGLOBIN (HGB A1C): Hemoglobin A1C: 5.9

## 2014-06-21 NOTE — Patient Instructions (Signed)
Bone Health Our bones do many things. They provide structure, protect organs, anchor muscles, and store calcium. Adequate calcium in your diet and weight-bearing physical activity help build strong bones, improve bone amounts, and may reduce the risk of weakening of bones (osteoporosis) later in life. PEAK BONE MASS By age 77, the average woman has acquired most of her skeletal bone mass. A large decline occurs in older adults which increases the risk of osteoporosis. In women this occurs around the time of menopause. It is important for young girls to reach their peak bone mass in order to maintain bone health throughout life. A person with high bone mass as a young adult will be more likely to have a higher bone mass later in life. Not enough calcium consumption and physical activity early on could result in a failure to achieve optimum bone mass in adulthood. OSTEOPOROSIS Osteoporosis is a disease of the bones. It is defined as low bone mass with deterioration of bone structure. Osteoporosis leads to an increase risk of fractures with falls. These fractures commonly happen in the wrist, hip, and spine. While men and women of all ages and background can develop osteoporosis, some of the risk factors for osteoporosis are:  Female.  White.  Postmenopausal.  Older adults.  Small in body size.  Eating a diet low in calcium.  Physically inactive.  Smoking.  Use of some medications.  Family history. CALCIUM Calcium is a mineral needed by the body for healthy bones, teeth, and proper function of the heart, muscles, and nerves. The body cannot produce calcium so it must be absorbed through food. Good sources of calcium include:  Dairy products (low fat or nonfat milk, cheese, and yogurt).  Dark green leafy vegetables (bok choy and broccoli).  Calcium fortified foods (orange juice, cereal, bread, soy beverages, and tofu products).  Nuts (almonds). Recommended amounts of calcium vary  for individuals. RECOMMENDED CALCIUM INTAKES Age and Amount in mg per day  Children 1 to 3 years / 700 mg  Children 4 to 8 years / 1,000 mg  Children 9 to 13 years / 1,300 mg  Teens 14 to 18 years / 1,300 mg  Adults 19 to 50 years / 1,000 mg  Adult women 51 to 70 years / 1,200 mg  Adults 71 years and older / 1,200 mg  Pregnant and breastfeeding teens / 1,300 mg  Pregnant and breastfeeding adults / 1,000 mg Vitamin D also plays an important role in healthy bone development. Vitamin D helps in the absorption of calcium. WEIGHT-BEARING PHYSICAL ACTIVITY Regular physical activity has many positive health benefits. Benefits include strong bones. Weight-bearing physical activity early in life is important in reaching peak bone mass. Weight-bearing physical activities cause muscles and bones to work against gravity. Some examples of weight bearing physical activities include:  Walking, jogging, or running.  Field Hockey.  Jumping rope.  Dancing.  Soccer.  Tennis or Racquetball.  Stair climbing.  Basketball.  Hiking.  Weight lifting.  Aerobic fitness classes. Including weight-bearing physical activity into an exercise plan is a great way to keep bones healthy. Adults: Engage in at least 30 minutes of moderate physical activity on most, preferably all, days of the week. Children: Engage in at least 60 minutes of moderate physical activity on most, preferably all, days of the week. FOR MORE INFORMATION [place] Department of Agriculture, Center for Nutrition Policy and Promotion: www.cnpp.usda.gov National Osteoporosis Foundation: www.nof.org Document Released: 04/26/2003 Document Revised: 05/31/2012 Document Reviewed: 07/26/2008 ExitCare Patient Information   2015 [employer]. This information is not intended to replace advice given to you by your health care provider. Make sure you discuss any questions you have with your health care provider.  

## 2014-06-21 NOTE — Progress Notes (Signed)
Subjective:    Patient ID: Veronica Paul, female    DOB: 1935-09-23, 79 y.o.   MRN: [id number]  Patient here today for follow up- doing well- no complaints- no changes since last visit  Diabetes She presents for her follow-up diabetic visit. She has type 2 diabetes mellitus. No MedicAlert identification noted. There are no hypoglycemic associated symptoms. Pertinent negatives for hypoglycemia include no headaches. Associated symptoms include chest pain. Pertinent negatives for diabetes include no fatigue, no polydipsia, no polyphagia, no polyuria and no visual change. Symptoms are stable. There are no diabetic complications. Risk factors for coronary artery disease include diabetes mellitus, dyslipidemia, hypertension, obesity and post-menopausal. When asked about current treatments, none were reported. Her weight is stable. She is following a generally healthy diet. When asked about meal planning, she reported none. She has not had a previous visit with a dietitian. She rarely participates in exercise. (Patient does not check blood sugars at home) An ACE inhibitor/angiotensin II receptor blocker is being taken. She does not see a podiatrist.Eye exam is current.  Hypertension This is a chronic problem. The current episode started more than 1 year ago. The problem is controlled. Associated symptoms include chest pain and shortness of breath. Pertinent negatives include no headaches or palpitations. Risk factors for coronary artery disease include diabetes mellitus, dyslipidemia, obesity, post-menopausal state and sedentary lifestyle. Past treatments include angiotensin blockers and calcium channel blockers. The current treatment provides moderate improvement. Compliance problems include diet and exercise.  Hypertensive end-organ damage includes a thyroid problem.  Thyroid Problem Visit type: hypothyroidism. Patient reports no constipation, diaphoresis, fatigue, palpitations or visual change. The symptoms  have been stable. Her past medical history is significant for hyperlipidemia.  Hyperlipidemia This is a chronic problem. The current episode started more than 1 year ago. The problem is uncontrolled. Recent lipid tests were reviewed and are high. Associated symptoms include chest pain and shortness of breath. Pertinent negatives include no myalgias. Current antihyperlipidemic treatment includes statins (lipitor is causing legs to hurt). The current treatment provides moderate improvement of lipids. Compliance problems include adherence to diet and adherence to exercise.   GAD Just started on clonazepam at last visit has really help her c/o chest pain at last visit- SHe only takes as needed.  Review of Systems  Constitutional: Negative for diaphoresis and fatigue.  Respiratory: Positive for shortness of breath.   Cardiovascular: Positive for chest pain. Negative for palpitations.  Gastrointestinal: Negative for constipation.  Endocrine: Negative for polydipsia, polyphagia and polyuria.  Musculoskeletal: Negative for myalgias.  Neurological: Negative for headaches.  All other systems reviewed and are negative.      Objective:   Physical Exam  Constitutional: She is oriented to person, place, and time. She appears well-developed and well-nourished.  HENT:  Nose: Nose normal.  Mouth/Throat: Oropharynx is clear and moist.  Eyes: EOM are normal.  Neck: Trachea normal, normal range of motion and full passive range of motion without pain. Neck supple. No JVD present. Carotid bruit is not present. No thyromegaly present.  Cardiovascular: Normal rate, regular rhythm, normal heart sounds and intact distal pulses.  Exam reveals no gallop and no friction rub.   No murmur heard. Pulmonary/Chest: Effort normal and breath sounds normal.  Abdominal: Soft. Bowel sounds are normal. She exhibits no distension and no mass. There is no tenderness.  Musculoskeletal: Normal range of motion.  Lymphadenopathy:     She has no cervical adenopathy.  Neurological: She is alert and oriented to person, place, and time.  She has normal reflexes.  Positive 4/4 monofilament bil feet.  Skin: Skin is warm and dry.  No callus formation bil feet  Psychiatric: She has a normal mood and affect. Her behavior is normal. Judgment and thought content normal.   BP 129/76 mmHg  Pulse 60  Temp(Src) 96.8 F (36 C) (Oral)  Ht '5\' 3"'  (1.6 m)  Wt 197 lb (89.359 kg)  BMI 34.91 kg/m2  LMP 05/11/1992  Results for orders placed or performed in visit on 06/21/14  POCT glycosylated hemoglobin (Hb A1C)  Result Value Ref Range   Hemoglobin A1C 5.9    Adella Nissen, FNP        Assessment & Plan:  1. Essential hypertension Do not add salt to diet - CMP14+EGFR - EKG 12-Lead  2. Dyslipidemia Low fat diet - NMR, lipoprofile  3. Diabetes mellitus without complication Watch carbs - POCT glycosylated hemoglobin (Hb A1C)  4. Hypothyroidism, unspecified hypothyroidism type  5. Peripheral edema Elevate legs when sitting  6. GAD (generalized anxiety disorder) Stress management  7. Depression  8. BMI 34.0-34.9,adult Discussed diet and exercise for person with BMI >25 Will recheck weight in 3-6 months     Labs pending Health maintenance reviewed Diet and exercise encouraged Continue all meds Follow up  In 3 months   [place], FNP

## 2014-06-22 LAB — NMR, LIPOPROFILE
CHOLESTEROL: 151 mg/dL (ref 100–199)
HDL Cholesterol by NMR: 56 mg/dL (ref >39)
HDL Particle Number: 35.1 umol/L (ref >=30.5)
LDL PARTICLE NUMBER: 882 nmol/L (ref <1000)
LDL SIZE: 20.6 nm (ref >20.5)
LDL-C: 65 mg/dL (ref 0–99)
LP-IR Score: 51 — ABNORMAL HIGH (ref <=45)
Small LDL Particle Number: 294 nmol/L (ref <=527)
Triglycerides by NMR: 148 mg/dL (ref 0–149)

## 2014-06-22 LAB — CMP14+EGFR
ALK PHOS: 54 IU/L (ref 39–117)
ALT: 12 IU/L (ref 0–32)
AST: 11 IU/L (ref 0–40)
Albumin/Globulin Ratio: 1.9 (ref 1.1–2.5)
Albumin: 4.2 g/dL (ref 3.5–4.8)
BUN/Creatinine Ratio: 15 (ref 11–26)
BUN: 13 mg/dL (ref 8–27)
Bilirubin Total: 0.4 mg/dL (ref 0.0–1.2)
CHLORIDE: 99 mmol/L (ref 97–108)
CO2: 28 mmol/L (ref 18–29)
Calcium: 9.2 mg/dL (ref 8.7–10.3)
Creatinine, Ser: 0.86 mg/dL (ref 0.57–1.00)
GFR calc Af Amer: 76 mL/min/{1.73_m2} (ref >59)
GFR calc non Af Amer: 66 mL/min/{1.73_m2} (ref >59)
Globulin, Total: 2.2 g/dL (ref 1.5–4.5)
Glucose: 103 mg/dL — ABNORMAL HIGH (ref 65–99)
Potassium: 4.6 mmol/L (ref 3.5–5.2)
SODIUM: 142 mmol/L (ref 134–144)
Total Protein: 6.4 g/dL (ref 6.0–8.5)

## 2014-07-03 ENCOUNTER — Ambulatory Visit (INDEPENDENT_AMBULATORY_CARE_PROVIDER_SITE_OTHER): Payer: Medicare Other | Admitting: Physician Assistant

## 2014-07-03 ENCOUNTER — Ambulatory Visit: Payer: Self-pay | Admitting: Physician Assistant

## 2014-07-03 ENCOUNTER — Encounter: Payer: Self-pay | Admitting: Physician Assistant

## 2014-07-03 VITALS — BP 123/72 | HR 76 | Temp 95.6°F | Ht 63.0 in | Wt 195.0 lb

## 2014-07-03 DIAGNOSIS — L299 Pruritus, unspecified: Secondary | ICD-10-CM

## 2014-07-03 DIAGNOSIS — L570 Actinic keratosis: Secondary | ICD-10-CM

## 2014-07-03 DIAGNOSIS — W57XXXA Bitten or stung by nonvenomous insect and other nonvenomous arthropods, initial encounter: Secondary | ICD-10-CM

## 2014-07-03 DIAGNOSIS — S30860A Insect bite (nonvenomous) of lower back and pelvis, initial encounter: Secondary | ICD-10-CM

## 2014-07-03 DIAGNOSIS — B9689 Other specified bacterial agents as the cause of diseases classified elsewhere: Principal | ICD-10-CM

## 2014-07-03 DIAGNOSIS — L089 Local infection of the skin and subcutaneous tissue, unspecified: Secondary | ICD-10-CM

## 2014-07-03 MED ORDER — DOXYCYCLINE HYCLATE 100 MG PO TABS: 100.0000 mg | ORAL_TABLET | Freq: Two times a day (BID) | ORAL | Status: DC

## 2014-07-03 MED ORDER — TRIAMCINOLONE ACETONIDE 0.1 % EX CREA: 1.0000 "application " | TOPICAL_CREAM | Freq: Two times a day (BID) | CUTANEOUS | Status: DC

## 2014-07-03 NOTE — Progress Notes (Signed)
   Subjective:    Patient ID: Veronica Paul, female    DOB: 07-29-1935, 79 y.o.   MRN: [id number]  HPI 79 y/o female presents with "bite" on right buttocks x 7 days. Painful, itching, enlarging. Visited Urgent Care in March with similar pustular lesion on posterior left thigh. Was treated with Bactrim, which she developed a severe rash with. Has put otc hydrocortisone on lesion today for itching, with some relief.     Review of Systems  Constitutional: Negative.   Skin:       Red, itching and painful lesionon left buttock x 7 days        Objective:   Physical Exam  Constitutional: She is oriented to person, place, and time. She appears well-developed and well-nourished.  Neurological: She is alert and oriented to person, place, and time.  Skin:  Erythematous lesion on right buttock with irregular borders, central excoriation. Approximately 3cm x 1 cm is size.  Annular patch inferior to previously described area. Raised borders, possible erythema migricans   Erythematous scaling plaques on right cheek x 3, probably AK's   Psychiatric: She has a normal mood and affect. Her behavior is normal. Judgment and thought content normal.  Nursing note and vitals reviewed.         Assessment & Plan:  1. Bacterial skin infection of trunk   - doxycycline (VIBRA-TABS) 100 MG tablet; Take 1 tablet (100 mg total) by mouth 2 (two) times daily.  Dispense: 28 tablet; Refill: 0 - Aerobic culture  2. Itch of skin  - triamcinolone cream (KENALOG) 0.1 %; Apply 1 application topically 2 (two) times daily.  Dispense: 30 g; Refill: 0  3. Actinic keratosis of right cheek - Treated with cryosurgery. Advised patient to f/u if lesions recur post treatment. Will need biopsy to r/o BCC/SCC.    4. Tick bite of buttock, initial encounter  - doxycycline (VIBRA-TABS) 100 MG tablet; Take 1 tablet (100 mg total) by mouth 2 (two) times daily.  Dispense: 28 tablet; Refill: 0 - Lyme Ab/Western Blot Reflex -  Rocky mtn spotted fvr abs pnl(IgG+IgM)  Can apply cream  To area on bottom 2-3 times daily  Do not take antibiotic on empty stomach Apply vaseline to areas of face for healing  Take Zytec '10mg'$  daily for relief of itching or Benadryl '25mg'$  every 6 hours.   Continue all meds Labs pending Health Maintenance reviewed Diet and exercise encouraged RTO 2- 3 weeks   Pawan Knechtel A. Benjamin Stain PA-C

## 2014-07-03 NOTE — Patient Instructions (Signed)
Can apply cream  To area on bottom 2-3 times daily  Do not take antibiotic on empty stomach Apply vaseline to areas of face for healing  Take Zytec '10mg'$  daily for relief of itching or Benadryl '25mg'$  every 6 hours.

## 2014-07-05 LAB — LYME AB/WESTERN BLOT REFLEX
LYME DISEASE AB, QUANT, IGM: <0.8 index (ref 0.00–0.79)
Lyme IgG/IgM Ab: <0.91 {ISR} (ref 0.00–0.90)

## 2014-07-05 LAB — ROCKY MTN SPOTTED FVR ABS PNL(IGG+IGM)
RMSF IgG: NEGATIVE
RMSF IgM: 1.14 index — ABNORMAL HIGH (ref 0.00–0.89)

## 2014-07-05 LAB — AEROBIC CULTURE

## 2014-07-14 ENCOUNTER — Telehealth: Payer: Self-pay | Admitting: Nurse Practitioner

## 2014-07-14 NOTE — Telephone Encounter (Signed)
Patient removed another tick bite this am but she is still on doxycyline and that she should still be covered.

## 2014-07-18 ENCOUNTER — Other Ambulatory Visit: Payer: Self-pay | Admitting: Nurse Practitioner

## 2014-07-18 NOTE — Telephone Encounter (Signed)
Last filled and seen 06/19/14. Call into [facility]

## 2014-07-18 NOTE — Telephone Encounter (Signed)
Please call in klonopin with 1 refills 

## 2014-07-20 ENCOUNTER — Encounter: Payer: Self-pay | Admitting: Physician Assistant

## 2014-07-20 ENCOUNTER — Ambulatory Visit (INDEPENDENT_AMBULATORY_CARE_PROVIDER_SITE_OTHER): Payer: Medicare Other | Admitting: Physician Assistant

## 2014-07-20 ENCOUNTER — Ambulatory Visit: Payer: Self-pay | Admitting: Physician Assistant

## 2014-07-20 VITALS — BP 134/68 | HR 75 | Temp 97.6°F | Ht 63.0 in | Wt 196.0 lb

## 2014-07-20 DIAGNOSIS — L570 Actinic keratosis: Secondary | ICD-10-CM

## 2014-07-20 DIAGNOSIS — W57XXXD Bitten or stung by nonvenomous insect and other nonvenomous arthropods, subsequent encounter: Secondary | ICD-10-CM

## 2014-07-20 DIAGNOSIS — R21 Rash and other nonspecific skin eruption: Secondary | ICD-10-CM

## 2014-07-20 DIAGNOSIS — R899 Unspecified abnormal finding in specimens from other organs, systems and tissues: Secondary | ICD-10-CM | POA: Diagnosis not present

## 2014-07-20 DIAGNOSIS — S30860D Insect bite (nonvenomous) of lower back and pelvis, subsequent encounter: Secondary | ICD-10-CM

## 2014-07-20 NOTE — Progress Notes (Signed)
   Subjective:    Patient ID: Veronica Paul, female    DOB: February 26, 1935, 79 y.o.   MRN: [id number]  HPI 79 y/o female presents for rash on bilateral arms. Non pruritic. Worsening, red x 2 days. Denies change in washing detergent or lotions. She recently underwent treatment with Doxycycline '100mg'$  BID x 14 days s/p tick bite on her back. She finished the Doxycycline 3 days ago. States that she has been outside occasionally on sunny days while taking the Doxycycline. She also had a tick bite 5 days ago on her lateral left thigh.    Review of Systems  Gastrointestinal: Negative for nausea and vomiting.  Skin: Positive for rash (BUE, red, nonpruritic x 3 days. ).  Neurological: Negative for headaches.       Objective:   Physical Exam  Constitutional: She appears well-developed and well-nourished. No distress.  Skin: She is not diaphoretic.  Generalized small patches, approximately 1cm, on posterior forearms , primarily sun-exposed areas. Upper arms, trunk, BLE clear  Erythematous localized whelp on left anterior/lateral thigh from tick bite 5 days ago  Areas on face treated with cryosurgery 2 weeks ago remain erythematous with slight scale. Continues to heal   Nursing note and vitals reviewed.          Assessment & Plan:  1. Tick bite of back, subsequent encounter  - Rocky mtn spotted fvr abs pnl(IgG+IgM) - Lyme Ab/Western Blot Reflex  2. Abnormal laboratory test result  - Rocky mtn spotted fvr abs pnl(IgG+IgM) - Lyme Ab/Western Blot Reflex  3. Actinic keratoses - continue to monitor areas on face that were treated with cryosurgery. Advised patient to f/u in office for biopsy if lesions recur after healing   4. Rash and nonspecific skin eruption - I feel that this is due to sun exposure with taking Doxycycline due to location and history from patient. Advised patient to use lotion and rtc if rash spreads or worsens.    Continue all meds Labs pending Health Maintenance  reviewed Diet and exercise encouraged Will determine f/u once labs are obtained.   Tierra Divelbiss A. Benjamin Stain PA-C

## 2014-07-22 LAB — ROCKY MTN SPOTTED FVR ABS PNL(IGG+IGM)
RMSF IgG: NEGATIVE
RMSF IgM: 1.11 index — ABNORMAL HIGH (ref 0.00–0.89)

## 2014-07-22 LAB — LYME AB/WESTERN BLOT REFLEX
LYME DISEASE AB, QUANT, IGM: <0.8 index (ref 0.00–0.79)
Lyme IgG/IgM Ab: <0.91 {ISR} (ref 0.00–0.90)

## 2014-07-23 ENCOUNTER — Other Ambulatory Visit: Payer: Self-pay | Admitting: Physician Assistant

## 2014-07-23 MED ORDER — DOXYCYCLINE HYCLATE 100 MG PO TABS: 100.0000 mg | ORAL_TABLET | Freq: Two times a day (BID) | ORAL | Status: DC

## 2014-07-28 ENCOUNTER — Encounter: Payer: Self-pay | Admitting: *Deleted

## 2014-07-28 ENCOUNTER — Encounter: Payer: Self-pay | Admitting: Physician Assistant

## 2014-07-28 ENCOUNTER — Ambulatory Visit (INDEPENDENT_AMBULATORY_CARE_PROVIDER_SITE_OTHER): Payer: Medicare Other | Admitting: Physician Assistant

## 2014-07-28 VITALS — BP 132/68 | HR 71 | Temp 97.1°F | Ht 63.0 in | Wt 194.0 lb

## 2014-07-28 DIAGNOSIS — D485 Neoplasm of uncertain behavior of skin: Secondary | ICD-10-CM

## 2014-07-28 DIAGNOSIS — L03115 Cellulitis of right lower limb: Secondary | ICD-10-CM

## 2014-07-28 DIAGNOSIS — L570 Actinic keratosis: Secondary | ICD-10-CM

## 2014-07-28 MED ORDER — DOXYCYCLINE HYCLATE 100 MG PO TABS: 100.0000 mg | ORAL_TABLET | Freq: Two times a day (BID) | ORAL | Status: DC

## 2014-07-28 NOTE — Addendum Note (Signed)
Addended by: Marline Backbone A on: 07/28/2014 09:28 AM   Modules accepted: Orders

## 2014-07-28 NOTE — Progress Notes (Signed)
   Subjective:    Patient ID: Veronica Paul, female    DOB: 04-Feb-1936, 79 y.o.   MRN: [id number]  HPI 79 y/o female presents for f/u of rash and tick bite. She has taken 21 days of Doxycycline '100mg'$  BID.     Review of Systems  Constitutional: Negative.   Gastrointestinal: Negative for nausea, vomiting and diarrhea.  Skin: Positive for rash.       Localized area of erythema and inflammation surround tick bite on posterior right leg. Well demarcated borders. Mild cellulitis  Macular rash on BUE in sun exposed areas, possible secondary to sun exposure while taking Doxy cycline  Previously treated hyperkeratotic lesion on right cheek recurred post tx with cryosurgery.   Area of actinic changes on BUE forearms   Neurological: Negative for headaches.       Objective:   Physical Exam  Skin:  Localized area of erythema and inflammation surround tick bite on posterior right leg. Well demarcated borders. Mild cellulitis  Macular rash on BUE in sun exposed areas, possible secondary to sun exposure while taking Doxy cycline  Previously treated hyperkeratotic lesion on right cheek recurred post tx with cryosurgery.   Areas of actinic changes on Bilateral forearms           Assessment & Plan:  1. Cellulitis of right lower extremity  - doxycycline (VIBRA-TABS) 100 MG tablet; Take 1 tablet (100 mg total) by mouth 2 (two) times daily.  Dispense: 14 tablet; Refill: 0  2. Actinic keratosis - Areas of face and BUE treated with cryosurgery x 5  3. Neoplasm of uncertain behavior of adnexa of skin - .50m lesion on right side of face biopsied via shave to r/o BCC/SCC   Continue all meds Labs pending Health Maintenance reviewed Diet and exercise encouraged RTO 2 weeks   Gavinn Collard A. GBenjamin StainPA-C

## 2014-07-28 NOTE — Patient Instructions (Signed)
Apply vaseline to face until healed Wash with dove

## 2014-08-01 LAB — PATHOLOGY

## 2014-08-05 ENCOUNTER — Other Ambulatory Visit: Payer: Self-pay | Admitting: Family Medicine

## 2014-08-07 NOTE — Telephone Encounter (Signed)
Seen Veronica Paul 6/10/  MMM  5/4   No Vit D in EPIC

## 2014-08-11 ENCOUNTER — Encounter: Payer: Self-pay | Admitting: Physician Assistant

## 2014-08-11 ENCOUNTER — Ambulatory Visit (INDEPENDENT_AMBULATORY_CARE_PROVIDER_SITE_OTHER): Payer: Medicare Other | Admitting: Physician Assistant

## 2014-08-11 VITALS — BP 90/53 | HR 80 | Temp 97.3°F | Ht 63.0 in | Wt 188.0 lb

## 2014-08-11 DIAGNOSIS — S40812A Abrasion of left upper arm, initial encounter: Secondary | ICD-10-CM | POA: Diagnosis not present

## 2014-08-11 MED ORDER — MUPIROCIN 2 % EX OINT: TOPICAL_OINTMENT | CUTANEOUS | Status: DC

## 2014-08-11 NOTE — Progress Notes (Signed)
   Subjective:    Patient ID: Veronica Paul, female    DOB: Jul 17, 1935, 79 y.o.   MRN: [id number]  HPI 79 y/o female presents for follow up of cellulitis of right leg. It is much improved after treatment with Doxycycline. She finished her last pill 4 days ago.   She also had a lesion on her right face biopsied at her last visit, which was precancers ( actinic keratosis) She denies any further symptoms or enlargement of the area and does not want additional freezing on todays visit.   Torn skin on left arm that occurred yesterday after she hit it on her recliner.    Review of Systems  Constitutional: Negative.   Skin:       Cellulitis on back of leg resolved  Wound on Right forearm        Objective:   Physical Exam  Constitutional: She is oriented to person, place, and time. She appears well-developed and well-nourished. No distress.  Neurological: She is alert and oriented to person, place, and time.  Skin: No rash noted. She is not diaphoretic.  2 1cm Abrasions on right forearm  Remaining cellulitis on posterior right leg decreased in size, mild remaining pinkness , negative for ttp   Psychiatric: She has a normal mood and affect. Her behavior is normal. Judgment and thought content normal.  Nursing note and vitals reviewed.         Assessment & Plan:  1. Abrasion of left arm, initial encounter - Dressing applied in office  - mupirocin ointment (BACTROBAN) 2 %; Apply to wound BID x 10 days  Dispense: 22 g; Refill: 0   Continue all meds Labs pending Health Maintenance reviewed Diet and exercise encouraged RTO prn   Rayder Sullenger A. Benjamin Stain PA-C

## 2014-08-19 ENCOUNTER — Ambulatory Visit (INDEPENDENT_AMBULATORY_CARE_PROVIDER_SITE_OTHER): Payer: Medicare Other | Admitting: Nurse Practitioner

## 2014-08-19 VITALS — BP 120/64 | HR 77 | Temp 97.4°F | Ht 63.0 in | Wt 192.0 lb

## 2014-08-19 DIAGNOSIS — R05 Cough: Secondary | ICD-10-CM | POA: Diagnosis not present

## 2014-08-19 DIAGNOSIS — R059 Cough, unspecified: Secondary | ICD-10-CM

## 2014-08-19 DIAGNOSIS — J029 Acute pharyngitis, unspecified: Secondary | ICD-10-CM | POA: Diagnosis not present

## 2014-08-19 DIAGNOSIS — J0100 Acute maxillary sinusitis, unspecified: Secondary | ICD-10-CM | POA: Diagnosis not present

## 2014-08-19 LAB — POCT RAPID STREP A (OFFICE): RAPID STREP A SCREEN: NEGATIVE

## 2014-08-19 LAB — POCT INFLUENZA A/B
INFLUENZA A, POC: NEGATIVE
Influenza B, POC: NEGATIVE

## 2014-08-19 MED ORDER — AMOXICILLIN 875 MG PO TABS: 875.0000 mg | ORAL_TABLET | Freq: Two times a day (BID) | ORAL | Status: DC

## 2014-08-19 MED ORDER — HYDROCODONE-HOMATROPINE 5-1.5 MG/5ML PO SYRP: 5.0000 mL | ORAL_SOLUTION | Freq: Four times a day (QID) | ORAL | Status: DC | PRN

## 2014-08-19 NOTE — Patient Instructions (Signed)

## 2014-08-19 NOTE — Progress Notes (Signed)
  Subjective:     Veronica Paul is a 79 y.o. female who presents for evaluation of sore throat. Associated symptoms include nasal blockage, post nasal drip, sinus and nasal congestion, bilateral sinus pain and sore throat. Onset of symptoms was 3 days ago, and have been gradually worsening since that time. She is drinking plenty of fluids. She has not had a recent close exposure to someone with proven streptococcal pharyngitis.  The following portions of the patient's history were reviewed and updated as appropriate: allergies, current medications, past family history, past medical history, past social history, past surgical history and problem list.  Review of Systems Pertinent items are noted in HPI.    Objective:    BP 120/64 mmHg  Pulse 77  Temp(Src) 97.4 F (36.3 C) (Oral)  Ht '5\' 3"'$  (1.6 m)  Wt 192 lb (87.091 kg)  BMI 34.02 kg/m2  LMP 05/11/1992 General appearance: alert and cooperative Eyes: conjunctivae/corneas clear. PERRL, EOM's intact. Fundi benign. Ears: normal TM's and external ear canals both ears Nose: clear discharge, moderate congestion, turbinates red, sinus tenderness bilateral Throat: lips, mucosa, and tongue normal; teeth and gums normal Lungs: clear to auscultation bilaterally Heart: regular rate and rhythm, S1, S2 normal, no murmur, click, rub or gallop  Laboratory    Assessment:    Acute pharyngitis, likely  bacterial sinusitis.    Plan:    Meds ordered this encounter  Medications  . amoxicillin (AMOXIL) 875 MG tablet    Sig: Take 1 tablet (875 mg total) by mouth 2 (two) times daily. 1 po BID    Dispense:  20 tablet    Refill:  0    Order Specific Question:  Supervising Provider    Answer:  Chipper Herb [[id number]]  . HYDROcodone-homatropine (HYCODAN) 5-1.5 MG/5ML syrup    Sig: Take 5 mLs by mouth every 6 (six) hours as needed for cough.    Dispense:  120 mL    Refill:  0    Order Specific Question:  Supervising Provider    Answer:  Chipper Herb [[id number]]   1. Take meds as prescribed 2. Use a cool mist humidifier especially during the winter months and when heat has been humid. 3. Use saline nose sprays frequently 4. Saline irrigations of the nose can be very helpful if done frequently.  * 4X daily for 1 week*  * Use of a nettie pot can be helpful with this. Follow directions with this* 5. Drink plenty of fluids 6. Keep thermostat turn down low 7.For any cough or congestion  Use plain Mucinex- regular strength or max strength is fine   * Children- consult with Pharmacist for dosing 8. For fever or aces or pains- take tylenol or ibuprofen appropriate for age and weight.  * for fevers greater than 101 orally you may alternate ibuprofen and tylenol every  3 hours.   [name], FNP

## 2014-08-25 ENCOUNTER — Ambulatory Visit (INDEPENDENT_AMBULATORY_CARE_PROVIDER_SITE_OTHER): Payer: Medicare Other | Admitting: Nurse Practitioner

## 2014-08-25 ENCOUNTER — Encounter: Payer: Self-pay | Admitting: Nurse Practitioner

## 2014-08-25 ENCOUNTER — Encounter: Payer: Self-pay | Admitting: *Deleted

## 2014-08-25 VITALS — BP 151/75 | HR 101 | Temp 97.0°F | Ht 63.0 in | Wt 189.0 lb

## 2014-08-25 DIAGNOSIS — J209 Acute bronchitis, unspecified: Secondary | ICD-10-CM | POA: Diagnosis not present

## 2014-08-25 MED ORDER — METHYLPREDNISOLONE ACETATE 80 MG/ML IJ SUSP
80.0000 mg | Freq: Once | INTRAMUSCULAR | Status: AC
  Administered 2014-08-25: 80 mg via INTRAMUSCULAR

## 2014-08-25 MED ORDER — HYDROCODONE-HOMATROPINE 5-1.5 MG/5ML PO SYRP
5.0000 mL | ORAL_SOLUTION | Freq: Four times a day (QID) | ORAL | Status: DC | PRN
Start: 2014-08-25 — End: 2014-10-06

## 2014-08-25 NOTE — Progress Notes (Signed)
   Subjective:    Patient ID: Veronica Paul, female    DOB: 1935-08-14, 79 y.o.   MRN: [id number]  HPI Patient was seen last Saturday and was dx with pharyngitis and sinusitis- she was given amoxicillin and hycodan- she says that she is no better.  She says that she has a headache,  decrease appetite and cough. No fever.    Review of Systems  Constitutional: Positive for appetite change (decreased). Negative for fever and chills.  HENT: Positive for congestion and sinus pressure (mild). Negative for ear pain, facial swelling and sore throat.   Respiratory: Positive for cough.   Cardiovascular: Negative.   Gastrointestinal: Negative.   Genitourinary: Negative.   Neurological: Negative.   Psychiatric/Behavioral: Negative.   All other systems reviewed and are negative.      Objective:   Physical Exam  Constitutional: She is oriented to person, place, and time. She appears well-developed and well-nourished. No distress.  HENT:  Right Ear: Hearing, tympanic membrane, external ear and ear canal normal.  Left Ear: Hearing, tympanic membrane, external ear and ear canal normal.  Nose: Mucosal edema and rhinorrhea present. Right sinus exhibits maxillary sinus tenderness. Right sinus exhibits no frontal sinus tenderness. Left sinus exhibits maxillary sinus tenderness. Left sinus exhibits no frontal sinus tenderness.  Mouth/Throat: Uvula is midline and oropharynx is clear and moist.  Cardiovascular: Normal rate and normal heart sounds.   Pulmonary/Chest: Effort normal and breath sounds normal. No respiratory distress. She has no wheezes. She has no rales. She exhibits no tenderness.  Dry cough  Abdominal: Soft.  Neurological: She is alert and oriented to person, place, and time.  Skin: Skin is warm and dry.  Psychiatric: She has a normal mood and affect. Her behavior is normal. Judgment and thought content normal.   BP 151/75 mmHg  Pulse 101  Temp(Src) 97 F (36.1 C) (Oral)  Ht '5\' 3"'$   (1.6 m)  Wt 189 lb (85.73 kg)  BMI 33.49 kg/m2  LMP 05/11/1992        Assessment & Plan:  1. Acute bronchitis, unspecified organism 1. Take meds as prescribed 2. Use a cool mist humidifier especially during the winter months and when heat has been humid. 3. Use saline nose sprays frequently 4. Saline irrigations of the nose can be very helpful if done frequently.  * 4X daily for 1 week*  * Use of a nettie pot can be helpful with this. Follow directions with this* 5. Drink plenty of fluids 6. Keep thermostat turn down low 7.For any cough or congestion  Use plain Mucinex- regular strength or max strength is fine   * Children- consult with Pharmacist for dosing 8. For fever or aces or pains- take tylenol or ibuprofen appropriate for age and weight.  * for fevers greater than 101 orally you may alternate ibuprofen and tylenol every  3 hours.   * be aware that blood sugars may go up slightly from depomedrol injestion - HYDROcodone-homatropine (HYCODAN) 5-1.5 MG/5ML syrup; Take 5 mLs by mouth every 6 (six) hours as needed for cough.  Dispense: 120 mL; Refill: 0 - methylPREDNISolone acetate (DEPO-MEDROL) injection 80 mg; Inject 1 mL (80 mg total) into the muscle once.  [name], FNP

## 2014-08-25 NOTE — Patient Instructions (Signed)
1. Take meds as prescribed 2. Use a cool mist humidifier especially during the winter months and when heat has been humid. 3. Use saline nose sprays frequently 4. Saline irrigations of the nose can be very helpful if done frequently.  * 4X daily for 1 week*  * Use of a nettie pot can be helpful with this. Follow directions with this* 5. Drink plenty of fluids 6. Keep thermostat turn down low 7.For any cough or congestion  Use plain Mucinex- regular strength or max strength is fine   * Children- consult with Pharmacist for dosing 8. For fever or aces or pains- take tylenol or ibuprofen appropriate for age and weight.  * for fevers greater than 101 orally you may alternate ibuprofen and tylenol every  3 hours.   * be aware that blood sugars may go up from depomedrol shot

## 2014-09-11 ENCOUNTER — Other Ambulatory Visit: Payer: Self-pay | Admitting: Nurse Practitioner

## 2014-09-13 DIAGNOSIS — Z1231 Encounter for screening mammogram for malignant neoplasm of breast: Secondary | ICD-10-CM | POA: Diagnosis not present

## 2014-09-26 ENCOUNTER — Ambulatory Visit: Payer: Medicare Other | Admitting: Nurse Practitioner

## 2014-09-27 ENCOUNTER — Ambulatory Visit: Payer: Medicare Other | Admitting: Nurse Practitioner

## 2014-10-02 ENCOUNTER — Encounter: Payer: Self-pay | Admitting: Nurse Practitioner

## 2014-10-06 ENCOUNTER — Encounter: Payer: Self-pay | Admitting: Nurse Practitioner

## 2014-10-06 ENCOUNTER — Ambulatory Visit (INDEPENDENT_AMBULATORY_CARE_PROVIDER_SITE_OTHER): Payer: Medicare Other | Admitting: Nurse Practitioner

## 2014-10-06 VITALS — BP 132/72 | HR 63 | Temp 96.9°F | Ht 63.0 in | Wt 185.0 lb

## 2014-10-06 DIAGNOSIS — E119 Type 2 diabetes mellitus without complications: Secondary | ICD-10-CM | POA: Diagnosis not present

## 2014-10-06 DIAGNOSIS — R609 Edema, unspecified: Secondary | ICD-10-CM

## 2014-10-06 DIAGNOSIS — F329 Major depressive disorder, single episode, unspecified: Secondary | ICD-10-CM | POA: Diagnosis not present

## 2014-10-06 DIAGNOSIS — M8580 Other specified disorders of bone density and structure, unspecified site: Secondary | ICD-10-CM | POA: Diagnosis not present

## 2014-10-06 DIAGNOSIS — I10 Essential (primary) hypertension: Secondary | ICD-10-CM | POA: Diagnosis not present

## 2014-10-06 DIAGNOSIS — F411 Generalized anxiety disorder: Secondary | ICD-10-CM | POA: Diagnosis not present

## 2014-10-06 DIAGNOSIS — Z6832 Body mass index (BMI) 32.0-32.9, adult: Secondary | ICD-10-CM | POA: Diagnosis not present

## 2014-10-06 DIAGNOSIS — E785 Hyperlipidemia, unspecified: Secondary | ICD-10-CM

## 2014-10-06 DIAGNOSIS — F32A Depression, unspecified: Secondary | ICD-10-CM

## 2014-10-06 DIAGNOSIS — E039 Hypothyroidism, unspecified: Secondary | ICD-10-CM | POA: Diagnosis not present

## 2014-10-06 LAB — POCT GLYCOSYLATED HEMOGLOBIN (HGB A1C): Hemoglobin A1C: 5.9

## 2014-10-06 MED ORDER — METFORMIN HCL 500 MG PO TABS: ORAL_TABLET | ORAL | Status: DC

## 2014-10-06 MED ORDER — FUROSEMIDE 40 MG PO TABS: 40.0000 mg | ORAL_TABLET | Freq: Every day | ORAL | Status: DC

## 2014-10-06 MED ORDER — LOSARTAN POTASSIUM 100 MG PO TABS: 100.0000 mg | ORAL_TABLET | Freq: Every day | ORAL | Status: DC

## 2014-10-06 MED ORDER — SIMVASTATIN 40 MG PO TABS: ORAL_TABLET | ORAL | Status: DC

## 2014-10-06 MED ORDER — METOPROLOL SUCCINATE ER 200 MG PO TB24: ORAL_TABLET | ORAL | Status: DC

## 2014-10-06 MED ORDER — LEVOTHYROXINE SODIUM 88 MCG PO TABS: ORAL_TABLET | ORAL | Status: DC

## 2014-10-06 MED ORDER — AMLODIPINE BESYLATE 5 MG PO TABS: 5.0000 mg | ORAL_TABLET | Freq: Every day | ORAL | Status: DC

## 2014-10-06 NOTE — Patient Instructions (Signed)
Bone Health Our bones do many things. They provide structure, protect organs, anchor muscles, and store calcium. Adequate calcium in your diet and weight-bearing physical activity help build strong bones, improve bone amounts, and may reduce the risk of weakening of bones (osteoporosis) later in life. PEAK BONE MASS By age 77, the average woman has acquired most of her skeletal bone mass. A large decline occurs in older adults which increases the risk of osteoporosis. In women this occurs around the time of menopause. It is important for young girls to reach their peak bone mass in order to maintain bone health throughout life. A person with high bone mass as a young adult will be more likely to have a higher bone mass later in life. Not enough calcium consumption and physical activity early on could result in a failure to achieve optimum bone mass in adulthood. OSTEOPOROSIS Osteoporosis is a disease of the bones. It is defined as low bone mass with deterioration of bone structure. Osteoporosis leads to an increase risk of fractures with falls. These fractures commonly happen in the wrist, hip, and spine. While men and women of all ages and background can develop osteoporosis, some of the risk factors for osteoporosis are:  Female.  White.  Postmenopausal.  Older adults.  Small in body size.  Eating a diet low in calcium.  Physically inactive.  Smoking.  Use of some medications.  Family history. CALCIUM Calcium is a mineral needed by the body for healthy bones, teeth, and proper function of the heart, muscles, and nerves. The body cannot produce calcium so it must be absorbed through food. Good sources of calcium include:  Dairy products (low fat or nonfat milk, cheese, and yogurt).  Dark green leafy vegetables (bok choy and broccoli).  Calcium fortified foods (orange juice, cereal, bread, soy beverages, and tofu products).  Nuts (almonds). Recommended amounts of calcium vary  for individuals. RECOMMENDED CALCIUM INTAKES Age and Amount in mg per day  Children 1 to 3 years / 700 mg  Children 4 to 8 years / 1,000 mg  Children 9 to 13 years / 1,300 mg  Teens 14 to 18 years / 1,300 mg  Adults 19 to 50 years / 1,000 mg  Adult women 51 to 70 years / 1,200 mg  Adults 71 years and older / 1,200 mg  Pregnant and breastfeeding teens / 1,300 mg  Pregnant and breastfeeding adults / 1,000 mg Vitamin D also plays an important role in healthy bone development. Vitamin D helps in the absorption of calcium. WEIGHT-BEARING PHYSICAL ACTIVITY Regular physical activity has many positive health benefits. Benefits include strong bones. Weight-bearing physical activity early in life is important in reaching peak bone mass. Weight-bearing physical activities cause muscles and bones to work against gravity. Some examples of weight bearing physical activities include:  Walking, jogging, or running.  Field Hockey.  Jumping rope.  Dancing.  Soccer.  Tennis or Racquetball.  Stair climbing.  Basketball.  Hiking.  Weight lifting.  Aerobic fitness classes. Including weight-bearing physical activity into an exercise plan is a great way to keep bones healthy. Adults: Engage in at least 30 minutes of moderate physical activity on most, preferably all, days of the week. Children: Engage in at least 60 minutes of moderate physical activity on most, preferably all, days of the week. FOR MORE INFORMATION [place] Department of Agriculture, Center for Nutrition Policy and Promotion: www.cnpp.usda.gov National Osteoporosis Foundation: www.nof.org Document Released: 04/26/2003 Document Revised: 05/31/2012 Document Reviewed: 07/26/2008 ExitCare Patient Information   2015 [employer]. This information is not intended to replace advice given to you by your health care provider. Make sure you discuss any questions you have with your health care provider.  

## 2014-10-06 NOTE — Progress Notes (Signed)
Subjective:    Patient ID: Veronica Paul, female    DOB: Jul 08, 1935, 79 y.o.   MRN: [id number]  Patient here today for follow up- doing well- no complaints- no changes since last visit  Diabetes She presents for her follow-up diabetic visit. She has type 2 diabetes mellitus. No MedicAlert identification noted. There are no hypoglycemic associated symptoms. Pertinent negatives for hypoglycemia include no headaches. Pertinent negatives for diabetes include no fatigue, no polydipsia, no polyphagia, no polyuria and no visual change. Symptoms are stable. There are no diabetic complications. Risk factors for coronary artery disease include diabetes mellitus, dyslipidemia, hypertension, obesity and post-menopausal. When asked about current treatments, none were reported. Her weight is stable. She is following a generally healthy diet. When asked about meal planning, she reported none. She has not had a previous visit with a dietitian. She rarely participates in exercise. (Patient does not check blood sugars at home) An ACE inhibitor/angiotensin II receptor blocker is being taken. She does not see a podiatrist.Eye exam is current.  Hypertension This is a chronic problem. The current episode started more than 1 year ago. The problem is controlled. Pertinent negatives include no headaches or palpitations. Risk factors for coronary artery disease include diabetes mellitus, dyslipidemia, obesity, post-menopausal state and sedentary lifestyle. Past treatments include angiotensin blockers and calcium channel blockers. The current treatment provides moderate improvement. Compliance problems include diet and exercise.  Hypertensive end-organ damage includes a thyroid problem.  Thyroid Problem Visit type: hypothyroidism. Patient reports no constipation, diaphoresis, fatigue, palpitations or visual change. The symptoms have been stable. Her past medical history is significant for hyperlipidemia.  Hyperlipidemia This is  a chronic problem. The current episode started more than 1 year ago. The problem is uncontrolled. Recent lipid tests were reviewed and are high. Pertinent negatives include no myalgias. Current antihyperlipidemic treatment includes statins (lipitor is causing legs to hurt). The current treatment provides moderate improvement of lipids. Compliance problems include adherence to diet and adherence to exercise.   GAD Just started on clonazepam at last visit has really help her c/o chest pain at last visit- SHe only takes as needed.  Review of Systems  Constitutional: Negative.  Negative for diaphoresis and fatigue.  HENT: Negative.   Respiratory: Negative.   Cardiovascular: Negative for palpitations.  Gastrointestinal: Negative for constipation.  Endocrine: Negative for polydipsia, polyphagia and polyuria.  Genitourinary: Negative.   Musculoskeletal: Negative for myalgias.  Neurological: Negative.  Negative for headaches.  Psychiatric/Behavioral: Negative.   All other systems reviewed and are negative.      Objective:   Physical Exam  Constitutional: She is oriented to person, place, and time. She appears well-developed and well-nourished.  HENT:  Nose: Nose normal.  Mouth/Throat: Oropharynx is clear and moist.  Eyes: EOM are normal.  Neck: Trachea normal, normal range of motion and full passive range of motion without pain. Neck supple. No JVD present. Carotid bruit is not present. No thyromegaly present.  Cardiovascular: Normal rate, regular rhythm, normal heart sounds and intact distal pulses.  Exam reveals no gallop and no friction rub.   No murmur heard. Pulmonary/Chest: Effort normal and breath sounds normal.  Abdominal: Soft. Bowel sounds are normal. She exhibits no distension and no mass. There is no tenderness.  Musculoskeletal: Normal range of motion.  Lymphadenopathy:    She has no cervical adenopathy.  Neurological: She is alert and oriented to person, place, and time. She  has normal reflexes.  Positive 4/4 monofilament bil feet.  Skin: Skin is warm  and dry.  No callus formation bil feet  Psychiatric: She has a normal mood and affect. Her behavior is normal. Judgment and thought content normal.   BP 132/72 mmHg  Pulse 63  Temp(Src) 96.9 F (36.1 C) (Oral)  Ht _0  (1.6 m)  Wt 185 lb (83.915 kg)  BMI 32.78 kg/m2  LMP 05/11/1992  Results for orders placed or performed in visit on 10/06/14  POCT glycosylated hemoglobin (Hb A1C)  Result Value Ref Range   Hemoglobin A1C 5.9           Assessment & Plan:  1. Essential hypertension Do not add salt to diet - CMP14+EGFR - losartan (COZAAR) 100 MG tablet; Take 1 tablet (100 mg total) by mouth daily.  Dispense: 30 tablet; Refill: 5 - metoprolol (TOPROL-XL) 200 MG 24 hr tablet; TAKE 1 & 1/2 TABLETS ONCE DAILY  Dispense: 45 tablet; Refill: 5 - amLODipine (NORVASC) 5 MG tablet; Take 1 tablet (5 mg total) by mouth daily.  Dispense: 30 tablet; Refill: 5  2. Dyslipidemia Low fat diet - Lipid panel - simvastatin (ZOCOR) 40 MG tablet; Take 1 tablet (40 mg total) by mouth at bedtime.  Dispense: 90 tablet; Refill: 1  3. Diabetes mellitus without complication Continue to watch carbs - POCT glycosylated hemoglobin (Hb A1C) - metFORMIN (GLUCOPHAGE) 500 MG tablet; TAKE (1) TABLET TWICE A DAY WITH MEALS (BREAKFAST AND SUPPER)  Dispense: 180 tablet; Refill: 1  4. Hypothyroidism, unspecified hypothyroidism type - levothyroxine (SYNTHROID, LEVOTHROID) 88 MCG tablet; Take 1 tablet (88 mcg total) by mouth daily before breakfast.  Dispense: 90 tablet; Refill: 1  5. Osteopenia Weight bearing exercises  6. Peripheral edema Elevate legs when swelling - furosemide (LASIX) 40 MG tablet; Take 1 tablet (40 mg total) by mouth daily.  Dispense: 30 tablet; Refill: 5  7. GAD (generalized anxiety disorder) Stress management  8. Depression  9. BMI 32.0-32.9,adult Discussed diet and exercise for person with BMI >25 Will  recheck weight in 3-6 months     Labs pending Health maintenance reviewed Diet and exercise encouraged Continue all meds Follow up  In 3 months   [place], FNP

## 2014-10-07 LAB — CMP14+EGFR
ALBUMIN: 4.1 g/dL (ref 3.5–4.8)
ALK PHOS: 65 IU/L (ref 39–117)
ALT: 12 IU/L (ref 0–32)
AST: 12 IU/L (ref 0–40)
Albumin/Globulin Ratio: 1.6 (ref 1.1–2.5)
BILIRUBIN TOTAL: 0.3 mg/dL (ref 0.0–1.2)
BUN / CREAT RATIO: 18 (ref 11–26)
BUN: 15 mg/dL (ref 8–27)
CHLORIDE: 99 mmol/L (ref 97–108)
CO2: 25 mmol/L (ref 18–29)
Calcium: 8.8 mg/dL (ref 8.7–10.3)
Creatinine, Ser: 0.82 mg/dL (ref 0.57–1.00)
GFR calc Af Amer: 80 mL/min/{1.73_m2} (ref >59)
GFR calc non Af Amer: 69 mL/min/{1.73_m2} (ref >59)
GLUCOSE: 109 mg/dL — AB (ref 65–99)
Globulin, Total: 2.6 g/dL (ref 1.5–4.5)
Potassium: 4.3 mmol/L (ref 3.5–5.2)
SODIUM: 141 mmol/L (ref 134–144)
Total Protein: 6.7 g/dL (ref 6.0–8.5)

## 2014-10-07 LAB — LIPID PANEL
CHOLESTEROL TOTAL: 134 mg/dL (ref 100–199)
Chol/HDL Ratio: 2.3 ratio units (ref 0.0–4.4)
HDL: 59 mg/dL (ref >39)
LDL Calculated: 60 mg/dL (ref 0–99)
Triglycerides: 76 mg/dL (ref 0–149)
VLDL CHOLESTEROL CAL: 15 mg/dL (ref 5–40)

## 2014-10-09 ENCOUNTER — Encounter: Payer: Self-pay | Admitting: Gastroenterology

## 2014-10-20 DIAGNOSIS — E119 Type 2 diabetes mellitus without complications: Secondary | ICD-10-CM | POA: Diagnosis not present

## 2014-10-20 DIAGNOSIS — H40033 Anatomical narrow angle, bilateral: Secondary | ICD-10-CM | POA: Diagnosis not present

## 2014-10-24 ENCOUNTER — Encounter: Payer: Self-pay | Admitting: Family Medicine

## 2014-10-24 ENCOUNTER — Ambulatory Visit (INDEPENDENT_AMBULATORY_CARE_PROVIDER_SITE_OTHER): Payer: Medicare Other | Admitting: Family Medicine

## 2014-10-24 ENCOUNTER — Other Ambulatory Visit: Payer: Self-pay | Admitting: Nurse Practitioner

## 2014-10-24 VITALS — BP 148/83 | HR 91 | Temp 97.4°F | Ht 63.0 in | Wt 185.6 lb

## 2014-10-24 DIAGNOSIS — J019 Acute sinusitis, unspecified: Secondary | ICD-10-CM | POA: Diagnosis not present

## 2014-10-24 DIAGNOSIS — J0101 Acute recurrent maxillary sinusitis: Secondary | ICD-10-CM

## 2014-10-24 MED ORDER — AMOXICILLIN 875 MG PO TABS: 875.0000 mg | ORAL_TABLET | Freq: Two times a day (BID) | ORAL | Status: DC

## 2014-10-24 MED ORDER — HYDROCODONE-HOMATROPINE 5-1.5 MG/5ML PO SYRP: 5.0000 mL | ORAL_SOLUTION | Freq: Four times a day (QID) | ORAL | Status: DC | PRN

## 2014-10-24 MED ORDER — PSEUDOEPHEDRINE-GUAIFENESIN ER 60-600 MG PO TB12: 1.0000 | ORAL_TABLET | Freq: Two times a day (BID) | ORAL | Status: DC

## 2014-10-24 NOTE — Progress Notes (Signed)
Subjective:  Patient ID: Veronica Paul, female    DOB: Sep 03, 1935  Age: 79 y.o. MRN: [id number]  CC: Sinusitis and Cough   HPI Veronica Paul presents for Symptoms include congestion, facial pain at cheeks, nasal congestion, no  fever, non productive cough, post nasal drip and sinus pressure with subjective fever, chills and sweats. Onset of symptoms was athree days ago, gradually worsening since that time. Cough started today. Pt.is using mucinex with no relief.   History Veronica Paul has a past medical history of HTN (hypertension); Diabetes mellitus; Dyslipidemia; Obesity; Hypothyroidism; Vitamin D deficiency; Osteopenia; Lung cancer; Depression; and Osteopenia.   She has past surgical history that includes Thyroidectomy and Lung removal, partial (1998).   Her family history includes Coronary artery disease (age of onset: 49) in her brother; Heart disease in her brother and father; Hypertension in her brother, father, mother, and sister. There is no history of Colon cancer.She reports that she has never smoked. She has never used smokeless tobacco. She reports that she does not drink alcohol or use illicit drugs.  Outpatient Prescriptions Prior to Visit  Medication Sig Dispense Refill  . amLODipine (NORVASC) 5 MG tablet Take 1 tablet (5 mg total) by mouth daily. 30 tablet 5  . aspirin EC 81 MG EC tablet Take 81 mg by mouth daily.      Marland Kitchen BLACK COHOSH PO Take by mouth.      . clonazePAM (KLONOPIN) 0.5 MG tablet TAKE  (1)  TABLET TWICE A DAY AS NEEDED. 60 tablet 1  . fish oil-omega-3 fatty acids 1000 MG capsule Take 2 g by mouth daily.      . furosemide (LASIX) 40 MG tablet Take 1 tablet (40 mg total) by mouth daily. 30 tablet 5  . glucose blood test strip Check BS BID and PRN . DX.E11.9 100 each 11  . Lancets (ONETOUCH ULTRASOFT) lancets Check BS BID and PRN. DX. E11.9 100 each 11  . levothyroxine (SYNTHROID, LEVOTHROID) 88 MCG tablet Take 1 tablet (88 mcg total) by mouth daily before  breakfast. 90 tablet 1  . losartan (COZAAR) 100 MG tablet Take 1 tablet (100 mg total) by mouth daily. 30 tablet 5  . metFORMIN (GLUCOPHAGE) 500 MG tablet TAKE (1) TABLET TWICE A DAY WITH MEALS (BREAKFAST AND SUPPER) 180 tablet 1  . metoprolol (TOPROL-XL) 200 MG 24 hr tablet TAKE 1 & 1/2 TABLETS ONCE DAILY 45 tablet 5  . nitroGLYCERIN (NITROSTAT) 0.4 MG SL tablet Place 1 tablet (0.4 mg total) under the tongue every 5 (five) minutes as needed for chest pain. 30 tablet 0  . simvastatin (ZOCOR) 40 MG tablet Take 1 tablet (40 mg total) by mouth at bedtime. 90 tablet 1  . Vitamin D, Ergocalciferol, (DRISDOL) 50000 UNITS CAPS capsule TAKE ONE CAPSULE BY MOUTH WEEKLY 12 capsule 0   No facility-administered medications prior to visit.    ROS Review of Systems  Constitutional: Positive for fever, chills and diaphoresis. Negative for activity change, appetite change and fatigue.  HENT: Positive for postnasal drip, rhinorrhea and sinus pressure. Negative for congestion, ear discharge, ear pain, hearing loss, nosebleeds, sneezing, sore throat and trouble swallowing.   Respiratory: Positive for cough. Negative for chest tightness and shortness of breath.   Cardiovascular: Negative for chest pain and palpitations.  Gastrointestinal: Negative for abdominal pain.  Musculoskeletal: Negative for arthralgias.  Skin: Negative for rash.    Objective:  BP 148/83 mmHg  Pulse 91  Temp(Src) 97.4 F (36.3 C) (Oral)  Ht  $'5\' 3"'f$  (1.6 m)  Wt 185 lb 9.6 oz (84.188 kg)  BMI 32.89 kg/m2  LMP 05/11/1992  BP Readings from Last 3 Encounters:  10/24/14 148/83  10/06/14 132/72  08/25/14 151/75    Wt Readings from Last 3 Encounters:  10/24/14 185 lb 9.6 oz (84.188 kg)  10/06/14 185 lb (83.915 kg)  08/25/14 189 lb (85.73 kg)     Physical Exam  Constitutional: She appears well-developed and well-nourished.  HENT:  Head: Normocephalic and atraumatic.  Right Ear: Tympanic membrane and external ear normal. No  decreased hearing is noted.  Left Ear: Tympanic membrane and external ear normal. No decreased hearing is noted.  Nose: Mucosal edema present. Right sinus exhibits maxillary sinus tenderness. Right sinus exhibits no frontal sinus tenderness. Left sinus exhibits maxillary sinus tenderness. Left sinus exhibits no frontal sinus tenderness.  Mouth/Throat: No oropharyngeal exudate or posterior oropharyngeal erythema.  Neck: No Brudzinski's sign noted.  Pulmonary/Chest: Breath sounds normal. No respiratory distress.  Lymphadenopathy:       Head (right side): No preauricular adenopathy present.       Head (left side): No preauricular adenopathy present.       Right cervical: No superficial cervical adenopathy present.      Left cervical: No superficial cervical adenopathy present.    Lab Results  Component Value Date   HGBA1C 5.9 10/06/2014   HGBA1C 5.9 06/21/2014   HGBA1C 5.9 03/15/2014    Lab Results  Component Value Date   WBC 9.4 06/07/2013   HGB 13.1 06/07/2013   HCT 40.7 06/07/2013   GLUCOSE 109* 10/06/2014   CHOL 134 10/06/2014   TRIG 76 10/06/2014   HDL 59 10/06/2014   LDLCALC 60 10/06/2014   ALT 12 10/06/2014   AST 12 10/06/2014   NA 141 10/06/2014   K 4.3 10/06/2014   CL 99 10/06/2014   CREATININE 0.82 10/06/2014   BUN 15 10/06/2014   CO2 25 10/06/2014   TSH 0.179* 12/02/2013   HGBA1C 5.9 10/06/2014    No results found.  Assessment & Plan:   Veronica Paul was seen today for sinusitis and cough.  Diagnoses and all orders for this visit:  Acute rhinosinusitis -     amoxicillin (AMOXIL) 875 MG tablet; Take 1 tablet (875 mg total) by mouth 2 (two) times daily.  Acute recurrent maxillary sinusitis  Other orders -     pseudoephedrine-guaifenesin (MUCINEX D) 60-600 MG per tablet; Take 1 tablet by mouth every 12 (twelve) hours. -     HYDROcodone-homatropine (HYCODAN) 5-1.5 MG/5ML syrup; Take 5 mLs by mouth every 6 (six) hours as needed for cough.   I am having Ms.  Beecham start on pseudoephedrine-guaifenesin and HYDROcodone-homatropine. I am also having her maintain her aspirin EC, fish oil-omega-3 fatty acids, BLACK COHOSH PO, nitroGLYCERIN, glucose blood, onetouch ultrasoft, clonazePAM, Vitamin D (Ergocalciferol), losartan, metoprolol, metFORMIN, amLODipine, simvastatin, furosemide, levothyroxine, and amoxicillin.  Meds ordered this encounter  Medications  . amoxicillin (AMOXIL) 875 MG tablet    Sig: Take 1 tablet (875 mg total) by mouth 2 (two) times daily.    Dispense:  20 tablet    Refill:  0  . pseudoephedrine-guaifenesin (MUCINEX D) 60-600 MG per tablet    Sig: Take 1 tablet by mouth every 12 (twelve) hours.    Dispense:  20 tablet    Refill:  1  . HYDROcodone-homatropine (HYCODAN) 5-1.5 MG/5ML syrup    Sig: Take 5 mLs by mouth every 6 (six) hours as needed for cough.  Dispense:  120 mL    Refill:  0     Follow-up: Return if symptoms worsen or fail to improve.  Claretta Fraise, M.D.

## 2014-10-25 NOTE — Telephone Encounter (Signed)
Last seen 10/24/14 Dr Livia Snellen  Don't see a VIT D level in EPIC

## 2014-11-02 ENCOUNTER — Ambulatory Visit (INDEPENDENT_AMBULATORY_CARE_PROVIDER_SITE_OTHER): Payer: Medicare Other | Admitting: Physician Assistant

## 2014-11-02 VITALS — BP 126/77 | HR 75 | Temp 96.9°F | Ht 63.0 in | Wt 184.0 lb

## 2014-11-02 DIAGNOSIS — J0101 Acute recurrent maxillary sinusitis: Secondary | ICD-10-CM

## 2014-11-02 DIAGNOSIS — J309 Allergic rhinitis, unspecified: Secondary | ICD-10-CM

## 2014-11-02 MED ORDER — FLUTICASONE PROPIONATE 50 MCG/ACT NA SUSP: 2.0000 | Freq: Every day | NASAL | Status: DC

## 2014-11-02 MED ORDER — AMOXICILLIN-POT CLAVULANATE 875-125 MG PO TABS: 1.0000 | ORAL_TABLET | Freq: Two times a day (BID) | ORAL | Status: DC

## 2014-11-02 NOTE — Progress Notes (Signed)
   Subjective:    Patient ID: Veronica Paul, female    DOB: [birth year], 79 y.o.   MRN: [id number]  HPI 79 y/o female presents with c/o "sinus infection that is not improving" She has 1 more pill left of her Amoxicillin '875mg'$  that she has been taking for 10 days. She was also told to take Musinex D, which she was unable to take d/t SE of insomnia.     Review of Systems  Constitutional: Negative.   HENT: Positive for congestion.        Facial pain   Neurological: Positive for headaches.  Psychiatric/Behavioral: Positive for behavioral problems.       Objective:   Physical Exam  Constitutional: She appears well-developed and well-nourished.  Bilateral maxillary sinus ttp   HENT:  Head: Normocephalic.  Right Ear: External ear normal.  Mouth/Throat: Oropharynx is clear and moist. No oropharyngeal exudate.  Cardiovascular: Normal rate.   Pulmonary/Chest: Effort normal.  Psychiatric: She has a normal mood and affect. Her behavior is normal. Judgment and thought content normal.  Vitals reviewed.         Assessment & Plan:  1. Acute recurrent maxillary sinusitis  - fluticasone (FLONASE) 50 MCG/ACT nasal spray; Place 2 sprays into both nostrils daily.  Dispense: 16 g; Refill: 6 - amoxicillin-clavulanate (AUGMENTIN) 875-125 MG per tablet; Take 1 tablet by mouth 2 (two) times daily.  Dispense: 20 tablet; Refill: 0  2. Allergic rhinitis, unspecified allergic rhinitis type - netti pot  OTC plain musinex  - fluticasone (FLONASE) 50 MCG/ACT nasal spray; Place 2 sprays into both nostrils daily.  Dispense: 16 g; Refill: 6   Riel Hirschman A. Benjamin Stain PA-C

## 2014-11-02 NOTE — Patient Instructions (Signed)
Take plain over the counter musines - No D  Sinusitis Sinusitis is redness, soreness, and puffiness (inflammation) of the air pockets in the bones of your face (sinuses). The redness, soreness, and puffiness can cause air and mucus to get trapped in your sinuses. This can allow germs to grow and cause an infection.  HOME CARE   Drink enough fluids to keep your pee (urine) clear or pale yellow.  Use a humidifier in your home.  Run a hot shower to create steam in the bathroom. Sit in the bathroom with the door closed. Breathe in the steam 3-4 times a day.  Put a warm, moist washcloth on your face 3-4 times a day, or as told by your doctor.  Use salt water sprays (saline sprays) to wet the thick fluid in your nose. This can help the sinuses drain.  Only take medicine as told by your doctor. GET HELP RIGHT AWAY IF:   Your pain gets worse.  You have very bad headaches.  You are sick to your stomach (nauseous).  You throw up (vomit).  You are very sleepy (drowsy) all the time.  Your face is puffy (swollen).  Your vision changes.  You have a stiff neck.  You have trouble breathing. MAKE SURE YOU:   Understand these instructions.  Will watch your condition.  Will get help right away if you are not doing well or get worse. Document Released: 07/23/2007 Document Revised: 10/29/2011 Document Reviewed: 09/09/2011 [facility] Patient Information 2015 [place], [place]. This information is not intended to replace advice given to you by your health care provider. Make sure you discuss any questions you have with your health care provider.

## 2014-11-28 DIAGNOSIS — H2513 Age-related nuclear cataract, bilateral: Secondary | ICD-10-CM | POA: Diagnosis not present

## 2014-12-05 DIAGNOSIS — H2513 Age-related nuclear cataract, bilateral: Secondary | ICD-10-CM | POA: Diagnosis not present

## 2014-12-11 DIAGNOSIS — E119 Type 2 diabetes mellitus without complications: Secondary | ICD-10-CM | POA: Diagnosis not present

## 2014-12-11 DIAGNOSIS — H2512 Age-related nuclear cataract, left eye: Secondary | ICD-10-CM | POA: Diagnosis not present

## 2014-12-19 DIAGNOSIS — H04123 Dry eye syndrome of bilateral lacrimal glands: Secondary | ICD-10-CM | POA: Diagnosis not present

## 2014-12-27 DIAGNOSIS — H353132 Nonexudative age-related macular degeneration, bilateral, intermediate dry stage: Secondary | ICD-10-CM | POA: Diagnosis not present

## 2014-12-28 ENCOUNTER — Ambulatory Visit (INDEPENDENT_AMBULATORY_CARE_PROVIDER_SITE_OTHER): Payer: Medicare Other

## 2014-12-28 DIAGNOSIS — Z23 Encounter for immunization: Secondary | ICD-10-CM | POA: Diagnosis not present

## 2015-01-04 DIAGNOSIS — H2511 Age-related nuclear cataract, right eye: Secondary | ICD-10-CM | POA: Diagnosis not present

## 2015-01-12 ENCOUNTER — Ambulatory Visit: Payer: Medicare Other | Admitting: Nurse Practitioner

## 2015-01-22 DIAGNOSIS — H04123 Dry eye syndrome of bilateral lacrimal glands: Secondary | ICD-10-CM | POA: Diagnosis not present

## 2015-01-30 ENCOUNTER — Encounter: Payer: Self-pay | Admitting: Nurse Practitioner

## 2015-01-30 ENCOUNTER — Ambulatory Visit (INDEPENDENT_AMBULATORY_CARE_PROVIDER_SITE_OTHER): Payer: Medicare Other | Admitting: Nurse Practitioner

## 2015-01-30 VITALS — BP 120/60 | HR 68 | Temp 96.9°F | Ht 63.0 in | Wt 183.0 lb

## 2015-01-30 DIAGNOSIS — M8580 Other specified disorders of bone density and structure, unspecified site: Secondary | ICD-10-CM

## 2015-01-30 DIAGNOSIS — E119 Type 2 diabetes mellitus without complications: Secondary | ICD-10-CM | POA: Diagnosis not present

## 2015-01-30 DIAGNOSIS — E785 Hyperlipidemia, unspecified: Secondary | ICD-10-CM

## 2015-01-30 DIAGNOSIS — E039 Hypothyroidism, unspecified: Secondary | ICD-10-CM | POA: Diagnosis not present

## 2015-01-30 DIAGNOSIS — F329 Major depressive disorder, single episode, unspecified: Secondary | ICD-10-CM | POA: Diagnosis not present

## 2015-01-30 DIAGNOSIS — F411 Generalized anxiety disorder: Secondary | ICD-10-CM | POA: Diagnosis not present

## 2015-01-30 DIAGNOSIS — I10 Essential (primary) hypertension: Secondary | ICD-10-CM

## 2015-01-30 DIAGNOSIS — Z6834 Body mass index (BMI) 34.0-34.9, adult: Secondary | ICD-10-CM

## 2015-01-30 DIAGNOSIS — F32A Depression, unspecified: Secondary | ICD-10-CM

## 2015-01-30 DIAGNOSIS — R609 Edema, unspecified: Secondary | ICD-10-CM | POA: Diagnosis not present

## 2015-01-30 LAB — POCT GLYCOSYLATED HEMOGLOBIN (HGB A1C): Hemoglobin A1C: 6.4

## 2015-01-30 MED ORDER — LOSARTAN POTASSIUM 100 MG PO TABS: 100.0000 mg | ORAL_TABLET | Freq: Every day | ORAL | Status: DC

## 2015-01-30 MED ORDER — METOPROLOL SUCCINATE ER 200 MG PO TB24: ORAL_TABLET | ORAL | Status: DC

## 2015-01-30 MED ORDER — FUROSEMIDE 40 MG PO TABS: 40.0000 mg | ORAL_TABLET | Freq: Every day | ORAL | Status: DC

## 2015-01-30 MED ORDER — AMLODIPINE BESYLATE 5 MG PO TABS: 5.0000 mg | ORAL_TABLET | Freq: Every day | ORAL | Status: DC

## 2015-01-30 MED ORDER — CLONAZEPAM 0.5 MG PO TABS: ORAL_TABLET | ORAL | Status: DC

## 2015-01-30 MED ORDER — SIMVASTATIN 40 MG PO TABS: ORAL_TABLET | ORAL | Status: DC

## 2015-01-30 MED ORDER — LEVOTHYROXINE SODIUM 88 MCG PO TABS: ORAL_TABLET | ORAL | Status: DC

## 2015-01-30 NOTE — Patient Instructions (Signed)
Bone Health Bones protect organs, store calcium, and anchor muscles. Good health habits, such as eating nutritious foods and exercising regularly, are important for maintaining healthy bones. They can also help to prevent a condition that causes bones to lose density and become weak and brittle (osteoporosis). WHY IS BONE MASS IMPORTANT? Bone mass refers to the amount of bone tissue that you have. The higher your bone mass, the stronger your bones. An important step toward having healthy bones throughout life is to have strong and dense bones during childhood. A young adult who has a high bone mass is more likely to have a high bone mass later in life. Bone mass at its greatest it is called peak bone mass. A large decline in bone mass occurs in older adults. In women, it occurs about the time of menopause. During this time, it is important to practice good health habits, because if more bone is lost than what is replaced, the bones will become less healthy and more likely to break (fracture). If you find that you have a low bone mass, you may be able to prevent osteoporosis or further bone loss by changing your diet and lifestyle. HOW CAN I FIND OUT IF MY BONE MASS IS LOW? Bone mass can be measured with an X-ray test that is called a bone mineral density (BMD) test. This test is recommended for all women who are age 65 or older. It may also be recommended for men who are age 70 or older, or for people who are more likely to develop osteoporosis due to:  Having bones that break easily.  Having a long-term disease that weakens bones, such as kidney disease or rheumatoid arthritis.  Having menopause earlier than normal.  Taking medicine that weakens bones, such as steroids, thyroid hormones, or hormone treatment for breast cancer or prostate cancer.  Smoking.  Drinking three or more alcoholic drinks each day. WHAT ARE THE NUTRITIONAL RECOMMENDATIONS FOR HEALTHY BONES? To have healthy bones, you need  to get enough of the right minerals and vitamins. Most nutrition experts recommend getting these nutrients from the foods that you eat. Nutritional recommendations vary from person to person. Ask your health care provider what is healthy for you. Here are some general guidelines. Calcium Recommendations Calcium is the most important (essential) mineral for bone health. Most people can get enough calcium from their diet, but supplements may be recommended for people who are at risk for osteoporosis. Good sources of calcium include:  Dairy products, such as low-fat or nonfat milk, cheese, and yogurt.  Dark green leafy vegetables, such as bok choy and broccoli.  Calcium-fortified foods, such as orange juice, cereal, bread, soy beverages, and tofu products.  Nuts, such as almonds. Follow these recommended amounts for daily calcium intake:  Children, age 1-3: 700 mg.  Children, age 4-8: 1,000 mg.  Children, age 9-13: 1,300 mg.  Teens, age 14-18: 1,300 mg.  Adults, age 19-50: 1,000 mg.  Adults, age 51-70:  Men: 1,000 mg.  Women: 1,200 mg.  Adults, age 71 or older: 1,200 mg.  Pregnant and breastfeeding females:  Teens: 1,300 mg.  Adults: 1,000 mg. Vitamin D Recommendations Vitamin D is the most essential vitamin for bone health. It helps the body to absorb calcium. Sunlight stimulates the skin to make vitamin D, so be sure to get enough sunlight. If you live in a cold climate or you do not get outside often, your health care provider may recommend that you take vitamin D supplements. Good   sources of vitamin D in your diet include:  Egg yolks.  Saltwater fish.  Milk and cereal fortified with vitamin D. Follow these recommended amounts for daily vitamin D intake:  Children and teens, age 1-18: 600 international units.  Adults, age 50 or younger: 400-800 international units.  Adults, age 51 or older: 800-1,000 international units. Other Nutrients Other nutrients for bone  health include:  Phosphorus. This mineral is found in meat, poultry, dairy foods, nuts, and legumes. The recommended daily intake for adult men and adult women is 700 mg.  Magnesium. This mineral is found in seeds, nuts, dark green vegetables, and legumes. The recommended daily intake for adult men is 400-420 mg. For adult women, it is 310-320 mg.  Vitamin K. This vitamin is found in green leafy vegetables. The recommended daily intake is 120 mg for adult men and 90 mg for adult women. WHAT TYPE OF PHYSICAL ACTIVITY IS BEST FOR BUILDING AND MAINTAINING HEALTHY BONES? Weight-bearing and strength-building activities are important for building and maintaining peak bone mass. Weight-bearing activities cause muscles and bones to work against gravity. Strength-building activities increases muscle strength that supports bones. Weight-bearing and muscle-building activities include:  Walking and hiking.  Jogging and running.  Dancing.  Gym exercises.  Lifting weights.  Tennis and racquetball.  Climbing stairs.  Aerobics. Adults should get at least 30 minutes of moderate physical activity on most days. Children should get at least 60 minutes of moderate physical activity on most days. Ask your health care provide what type of exercise is best for you. WHERE CAN I FIND MORE INFORMATION? For more information, check out the following websites:  National Osteoporosis Foundation: http://nof.org/learn/basics  National Institutes of Health: http://www.niams.nih.gov/Health_Info/Bone/Bone_Health/bone_health_for_life.asp   This information is not intended to replace advice given to you by your health care provider. Make sure you discuss any questions you have with your health care provider.   Document Released: 04/26/2003 Document Revised: 06/20/2014 Document Reviewed: 02/08/2014 [employer] Interactive Patient Education 2016 [employer]  

## 2015-01-30 NOTE — Progress Notes (Signed)
Subjective:    Patient ID: Veronica Paul, female    DOB: Jul 08, 1935, 79 y.o.   MRN: [id number]  Patient here today for follow up- doing well- no complaints- no changes since last visit  Diabetes She presents for her follow-up diabetic visit. She has type 2 diabetes mellitus. No MedicAlert identification noted. There are no hypoglycemic associated symptoms. Pertinent negatives for hypoglycemia include no headaches. Pertinent negatives for diabetes include no fatigue, no polydipsia, no polyphagia, no polyuria and no visual change. Symptoms are stable. There are no diabetic complications. Risk factors for coronary artery disease include diabetes mellitus, dyslipidemia, hypertension, obesity and post-menopausal. When asked about current treatments, none were reported. Her weight is stable. She is following a generally healthy diet. When asked about meal planning, she reported none. She has not had a previous visit with a dietitian. She rarely participates in exercise. (Patient does not check blood sugars at home) An ACE inhibitor/angiotensin II receptor blocker is being taken. She does not see a podiatrist.Eye exam is current.  Hypertension This is a chronic problem. The current episode started more than 1 year ago. The problem is controlled. Pertinent negatives include no headaches or palpitations. Risk factors for coronary artery disease include diabetes mellitus, dyslipidemia, obesity, post-menopausal state and sedentary lifestyle. Past treatments include angiotensin blockers and calcium channel blockers. The current treatment provides moderate improvement. Compliance problems include diet and exercise.  Hypertensive end-organ damage includes a thyroid problem.  Thyroid Problem Visit type: hypothyroidism. Patient reports no constipation, diaphoresis, fatigue, palpitations or visual change. The symptoms have been stable. Her past medical history is significant for hyperlipidemia.  Hyperlipidemia This is  a chronic problem. The current episode started more than 1 year ago. The problem is uncontrolled. Recent lipid tests were reviewed and are high. Pertinent negatives include no myalgias. Current antihyperlipidemic treatment includes statins (lipitor is causing legs to hurt). The current treatment provides moderate improvement of lipids. Compliance problems include adherence to diet and adherence to exercise.   GAD Just started on clonazepam at last visit has really help her c/o chest pain at last visit- SHe only takes as needed.  Review of Systems  Constitutional: Negative.  Negative for diaphoresis and fatigue.  HENT: Negative.   Respiratory: Negative.   Cardiovascular: Negative for palpitations.  Gastrointestinal: Negative for constipation.  Endocrine: Negative for polydipsia, polyphagia and polyuria.  Genitourinary: Negative.   Musculoskeletal: Negative for myalgias.  Neurological: Negative.  Negative for headaches.  Psychiatric/Behavioral: Negative.   All other systems reviewed and are negative.      Objective:   Physical Exam  Constitutional: She is oriented to person, place, and time. She appears well-developed and well-nourished.  HENT:  Nose: Nose normal.  Mouth/Throat: Oropharynx is clear and moist.  Eyes: EOM are normal.  Neck: Trachea normal, normal range of motion and full passive range of motion without pain. Neck supple. No JVD present. Carotid bruit is not present. No thyromegaly present.  Cardiovascular: Normal rate, regular rhythm, normal heart sounds and intact distal pulses.  Exam reveals no gallop and no friction rub.   No murmur heard. Pulmonary/Chest: Effort normal and breath sounds normal.  Abdominal: Soft. Bowel sounds are normal. She exhibits no distension and no mass. There is no tenderness.  Musculoskeletal: Normal range of motion.  Lymphadenopathy:    She has no cervical adenopathy.  Neurological: She is alert and oriented to person, place, and time. She  has normal reflexes.  Positive 4/4 monofilament bil feet.  Skin: Skin is warm  and dry.  No callus formation bil feet  Psychiatric: She has a normal mood and affect. Her behavior is normal. Judgment and thought content normal.   LMP 05/11/1992  BP 120/60 mmHg  Pulse 68  Temp(Src) 96.9 F (36.1 C) (Oral)  Ht $R'5\' 3"'YY$  (1.6 m)  Wt 183 lb (83.008 kg)  BMI 32.43 kg/m2  LMP 05/11/1992   Results for orders placed or performed in visit on 01/30/15  POCT glycosylated hemoglobin (Hb A1C)  Result Value Ref Range   Hemoglobin A1C 6.4          Assessment & Plan:  1. Essential hypertension Do not add salt to diet - CMP14+EGFR - DG Chest 2 View; Future - metoprolol (TOPROL-XL) 200 MG 24 hr tablet; TAKE 1 & 1/2 TABLETS ONCE DAILY  Dispense: 45 tablet; Refill: 5 - losartan (COZAAR) 100 MG tablet; Take 1 tablet (100 mg total) by mouth daily.  Dispense: 30 tablet; Refill: 5 - amLODipine (NORVASC) 5 MG tablet; Take 1 tablet (5 mg total) by mouth daily.  Dispense: 30 tablet; Refill: 5  2. Dyslipidemia Low fat diet - CMP14+EGFR - Lipid panel - simvastatin (ZOCOR) 40 MG tablet; Take 1 tablet (40 mg total) by mouth at bedtime.  Dispense: 90 tablet; Refill: 1  3. Diabetes mellitus without complication ([place]) Carb counting  To continue - POCT glycosylated hemoglobin (Hb A1C) - CMP14+EGFR  4. Hypothyroidism, unspecified hypothyroidism type - levothyroxine (SYNTHROID, LEVOTHROID) 88 MCG tablet; Take 1 tablet (88 mcg total) by mouth daily before breakfast.  Dispense: 90 tablet; Refill: 1  5. Osteopenia Weigh bearing exercises  6. BMI 34.0-34.9,adult Discussed diet and exercise for person with BMI >25 Will recheck weight in 3-6 months   7. Depression Stress management  8. Peripheral edema Elevate legs when sitting Compression soaks as needed - furosemide (LASIX) 40 MG tablet; Take 1 tablet (40 mg total) by mouth daily.  Dispense: 30 tablet; Refill: 5  9. GAD (generalized anxiety  disorder) Stress management - clonazePAM (KLONOPIN) 0.5 MG tablet; TAKE  (1)  TABLET TWICE A DAY AS NEEDED.  Dispense: 60 tablet; Refill: 1    Labs pending Health maintenance reviewed Diet and exercise encouraged Continue all meds Follow up  In 3 months   [place], FNP

## 2015-01-31 LAB — CMP14+EGFR
ALK PHOS: 62 IU/L (ref 39–117)
ALT: 12 IU/L (ref 0–32)
AST: 15 IU/L (ref 0–40)
Albumin/Globulin Ratio: 2 (ref 1.1–2.5)
Albumin: 4.7 g/dL (ref 3.5–4.8)
BUN / CREAT RATIO: 19 (ref 11–26)
BUN: 16 mg/dL (ref 8–27)
Bilirubin Total: 0.4 mg/dL (ref 0.0–1.2)
CHLORIDE: 97 mmol/L (ref 96–106)
CO2: 29 mmol/L (ref 18–29)
CREATININE: 0.86 mg/dL (ref 0.57–1.00)
Calcium: 9.1 mg/dL (ref 8.7–10.3)
GFR calc Af Amer: 75 mL/min/{1.73_m2} (ref >59)
GFR calc non Af Amer: 65 mL/min/{1.73_m2} (ref >59)
GLOBULIN, TOTAL: 2.4 g/dL (ref 1.5–4.5)
GLUCOSE: 106 mg/dL — AB (ref 65–99)
POTASSIUM: 4.3 mmol/L (ref 3.5–5.2)
SODIUM: 144 mmol/L (ref 134–144)
Total Protein: 7.1 g/dL (ref 6.0–8.5)

## 2015-01-31 LAB — LIPID PANEL
CHOLESTEROL TOTAL: 168 mg/dL (ref 100–199)
Chol/HDL Ratio: 2.8 ratio units (ref 0.0–4.4)
HDL: 61 mg/dL (ref >39)
LDL CALC: 84 mg/dL (ref 0–99)
TRIGLYCERIDES: 114 mg/dL (ref 0–149)
VLDL Cholesterol Cal: 23 mg/dL (ref 5–40)

## 2015-02-05 DIAGNOSIS — H1013 Acute atopic conjunctivitis, bilateral: Secondary | ICD-10-CM | POA: Diagnosis not present

## 2015-02-20 ENCOUNTER — Encounter: Payer: Self-pay | Admitting: Nurse Practitioner

## 2015-02-20 ENCOUNTER — Ambulatory Visit (INDEPENDENT_AMBULATORY_CARE_PROVIDER_SITE_OTHER): Payer: Medicare Other | Admitting: Nurse Practitioner

## 2015-02-20 VITALS — BP 119/113 | HR 121 | Temp 99.5°F | Ht 63.0 in | Wt 183.0 lb

## 2015-02-20 DIAGNOSIS — J029 Acute pharyngitis, unspecified: Secondary | ICD-10-CM

## 2015-02-20 MED ORDER — AMOXICILLIN 500 MG PO CAPS
500.0000 mg | ORAL_CAPSULE | Freq: Three times a day (TID) | ORAL | Status: DC
Start: 2015-02-20 — End: 2015-03-20

## 2015-02-20 NOTE — Progress Notes (Signed)
  Subjective:     Veronica Paul is a 80 y.o. female who presents for evaluation of sore throat. Associated symptoms include headache, nasal blockage, post nasal drip, sinus and nasal congestion and sore throat. Onset of symptoms was 4 days ago, and have been gradually worsening since that time. She is drinking plenty of fluids. She has not had a recent close exposure to someone with proven streptococcal pharyngitis.  The following portions of the patient's history were reviewed and updated as appropriate: allergies, current medications, past family history, past medical history, past social history, past surgical history and problem list.  Review of Systems Pertinent items are noted in HPI.    Objective:    BP 119/113 mmHg  Pulse 121  Temp(Src) 99.5 F (37.5 C) (Oral)  Ht '5\' 3"'$  (1.6 m)  Wt 183 lb (83.008 kg)  BMI 32.43 kg/m2  LMP 05/11/1992 General appearance: alert and cooperative Eyes: conjunctivae/corneas clear. PERRL, EOM's intact. Fundi benign. Ears: normal TM's and external ear canals both ears Nose: Nares normal. Septum midline. Mucosa normal. No drainage or sinus tenderness., clear and purulent discharge, moderate congestion, turbinates red Throat: lips, mucosa, and tongue normal; teeth and gums normal and post ora pharynx erythematous with excudate on right Lungs: clear to auscultation bilaterally Heart: regular rate and rhythm, S1, S2 normal, no murmur, click, rub or gallop  Laboratory Strep test not done. Results:N/A .    Assessment:    Acute pharyngitis, likely  excudative tonsillitis.    Plan:   Force fluids Motrin or tylenol OTC OTC decongestant Throat lozenges if help New toothbrush in 3 days    Meds ordered this encounter  Medications  . amoxicillin (AMOXIL) 500 MG capsule    Sig: Take 1 capsule (500 mg total) by mouth 3 (three) times daily.    Dispense:  30 capsule    Refill:  0    Order Specific Question:  Supervising Provider    Answer:  Chipper Herb Richardson, FNP

## 2015-02-20 NOTE — Patient Instructions (Signed)

## 2015-02-23 ENCOUNTER — Other Ambulatory Visit: Payer: Self-pay | Admitting: Family Medicine

## 2015-02-23 NOTE — Telephone Encounter (Signed)
i dont see a vit. D level in epic

## 2015-02-26 NOTE — Telephone Encounter (Signed)
Please review and advise.

## 2015-02-26 NOTE — Telephone Encounter (Signed)
holdoffon vitamin d until we do labs next to make sure still needs to take

## 2015-02-26 NOTE — Telephone Encounter (Signed)
Patient aware.

## 2015-03-20 ENCOUNTER — Ambulatory Visit (INDEPENDENT_AMBULATORY_CARE_PROVIDER_SITE_OTHER): Payer: Medicare Other

## 2015-03-20 ENCOUNTER — Encounter: Payer: Self-pay | Admitting: Family

## 2015-03-20 ENCOUNTER — Ambulatory Visit (INDEPENDENT_AMBULATORY_CARE_PROVIDER_SITE_OTHER): Payer: Medicare Other | Admitting: Family

## 2015-03-20 VITALS — BP 126/75 | HR 79 | Temp 97.0°F | Ht 63.0 in | Wt 184.2 lb

## 2015-03-20 DIAGNOSIS — R0602 Shortness of breath: Secondary | ICD-10-CM | POA: Diagnosis not present

## 2015-03-20 DIAGNOSIS — F411 Generalized anxiety disorder: Secondary | ICD-10-CM | POA: Diagnosis not present

## 2015-03-20 DIAGNOSIS — I10 Essential (primary) hypertension: Secondary | ICD-10-CM

## 2015-03-20 DIAGNOSIS — J309 Allergic rhinitis, unspecified: Secondary | ICD-10-CM

## 2015-03-20 DIAGNOSIS — J0101 Acute recurrent maxillary sinusitis: Secondary | ICD-10-CM | POA: Diagnosis not present

## 2015-03-20 MED ORDER — NITROGLYCERIN 0.4 MG SL SUBL: 0.4000 mg | SUBLINGUAL_TABLET | SUBLINGUAL | Status: DC | PRN

## 2015-03-20 MED ORDER — FLUTICASONE PROPIONATE 50 MCG/ACT NA SUSP: 2.0000 | Freq: Every day | NASAL | Status: DC

## 2015-03-20 MED ORDER — ESCITALOPRAM OXALATE 10 MG PO TABS: 10.0000 mg | ORAL_TABLET | Freq: Every day | ORAL | Status: DC

## 2015-03-20 NOTE — Progress Notes (Addendum)
Subjective:    Patient ID: Veronica Paul, female    DOB: [birth year], 80 y.o.   MRN: [id number]  PT presents to the office today with intermittent SOB. PT states this has been going on for the last couple of days and lasts for a few minutes. PT states it "hurts to breath at times and is worse when I take a deep breath". Pt is complaining of mild URI symptoms. Pt states the klonopin helps her breathing and states she gets anxious at times. Pt states she has had lung cancer in the past and worries a lot about her lungs. Pt denies any heartburn, chest pain , or edema. Shortness of Breath This is a new problem. The current episode started in the past 7 days. The problem occurs intermittently. The problem has been waxing and waning. Associated symptoms include ear pain, headaches and rhinorrhea. Pertinent negatives include no chest pain, fever, leg pain, leg swelling, neck pain, sore throat, sputum production or wheezing. Nothing aggravates the symptoms. She has tried rest for the symptoms. The treatment provided mild relief. There is no history of allergies.      Review of Systems  Constitutional: Negative.  Negative for fever.  HENT: Positive for ear pain and rhinorrhea. Negative for sore throat.   Eyes: Negative.   Respiratory: Positive for shortness of breath. Negative for sputum production and wheezing.   Cardiovascular: Negative.  Negative for chest pain, palpitations and leg swelling.  Gastrointestinal: Negative.   Endocrine: Negative.   Genitourinary: Negative.   Musculoskeletal: Negative.  Negative for neck pain.  Neurological: Positive for headaches.  Hematological: Negative.   Psychiatric/Behavioral: Negative.   All other systems reviewed and are negative.      Objective:   Physical Exam  Constitutional: She is oriented to person, place, and time. She appears well-developed and well-nourished. No distress.  HENT:  Head: Normocephalic and atraumatic.  Nasal passage erythemas  with moderate swelling    Eyes: Pupils are equal, round, and reactive to light.  Neck: Normal range of motion. Neck supple. No thyromegaly present.  Cardiovascular: Normal rate, regular rhythm, normal heart sounds and intact distal pulses.   No murmur heard. Pulmonary/Chest: Effort normal and breath sounds normal. No respiratory distress. She has no wheezes.  Abdominal: Soft. Bowel sounds are normal. She exhibits no distension. There is no tenderness.  Musculoskeletal: Normal range of motion. She exhibits no edema or tenderness.  Neurological: She is alert and oriented to person, place, and time. She has normal reflexes. No cranial nerve deficit.  Skin: Skin is warm and dry.  Psychiatric: She has a normal mood and affect. Her behavior is normal. Judgment and thought content normal.  No distress noted  Vitals reviewed.  Chest X-ray- No changes from previous x-ray Preliminary reading by Evelina Dun, FNP [facility]    BP 126/75 mmHg  Pulse 79  Temp(Src) 97 F (36.1 C) (Oral)  Ht '5\' 3"'$  (1.6 m)  Wt 184 lb 3.2 oz (83.553 kg)  BMI 32.64 kg/m2  SpO2 99%  LMP 05/11/1992     Assessment & Plan:  1. SOB (shortness of breath) - DG Chest 2 View; Future  2. GAD (generalized anxiety disorder) -Pt started on lexapro 10 mg today  -RTO in 1 month to recheck GAD and SOB -Stress management discussed - escitalopram (LEXAPRO) 10 MG tablet; Take 1 tablet (10 mg total) by mouth daily.  Dispense: 30 tablet; Refill: 5   4. Allergic rhinitis, unspecified allergic rhinitis type -Avoid allergens when  possible - fluticasone (FLONASE) 50 MCG/ACT nasal spray; Place 2 sprays into both nostrils daily.  Dispense: 16 g; Refill: [place], FNP

## 2015-03-20 NOTE — Patient Instructions (Addendum)
Allergic Rhinitis Allergic rhinitis is when the mucous membranes in the nose respond to allergens. Allergens are particles in the air that cause your body to have an allergic reaction. This causes you to release allergic antibodies. Through a chain of events, these eventually cause you to release histamine into the blood stream. Although meant to protect the body, it is this release of histamine that causes your discomfort, such as frequent sneezing, congestion, and an itchy, runny nose.  CAUSES Seasonal allergic rhinitis (hay fever) is caused by pollen allergens that may come from grasses, trees, and weeds. Year-round allergic rhinitis (perennial allergic rhinitis) is caused by allergens such as house dust mites, pet dander, and mold spores. SYMPTOMS  Nasal stuffiness (congestion).  Itchy, runny nose with sneezing and tearing of the eyes. DIAGNOSIS Your health care provider can help you determine the allergen or allergens that trigger your symptoms. If you and your health care provider are unable to determine the allergen, skin or blood testing may be used. Your health care provider will diagnose your condition after taking your health history and performing a physical exam. Your health care provider may assess you for other related conditions, such as asthma, pink eye, or an ear infection. TREATMENT Allergic rhinitis does not have a cure, but it can be controlled by:  Medicines that block allergy symptoms. These may include allergy shots, nasal sprays, and oral antihistamines.  Avoiding the allergen. Hay fever may often be treated with antihistamines in pill or nasal spray forms. Antihistamines block the effects of histamine. There are over-the-counter medicines that may help with nasal congestion and swelling around the eyes. Check with your health care provider before taking or giving this medicine. If avoiding the allergen or the medicine prescribed do not work, there are many new medicines  your health care provider can prescribe. Stronger medicine may be used if initial measures are ineffective. Desensitizing injections can be used if medicine and avoidance does not work. Desensitization is when a patient is given ongoing shots until the body becomes less sensitive to the allergen. Make sure you follow up with your health care provider if problems continue. HOME CARE INSTRUCTIONS It is not possible to completely avoid allergens, but you can reduce your symptoms by taking steps to limit your exposure to them. It helps to know exactly what you are allergic to so that you can avoid your specific triggers. SEEK MEDICAL CARE IF:  You have a fever.  You develop a cough that does not stop easily (persistent).  You have shortness of breath.  You start wheezing.  Symptoms interfere with normal daily activities.   This information is not intended to replace advice given to you by your health care provider. Make sure you discuss any questions you have with your health care provider.   Document Released: 10/29/2000 Document Revised: 02/24/2014 Document Reviewed: 10/11/2012 Elsevier Interactive Patient Education 2016 Else[employer] Generalized Anxiety Disorder Generalized anxiety disorder (GAD) is a mental disorder. It interferes with life functions, including relationships, work, and school. GAD is different from normal anxiety, which everyone experiences at some point in their lives in response to specific life events and activities. Normal anxiety actually helps [place] prepare for and get through these life events and activities. Normal anxiety goes away after the event or activity is over.  GAD causes anxiety that is not necessarily related to specific events or activities. It also causes excess anxiety in proportion to specific events or activities. The anxiety associated with GAD is also  difficult to control. GAD can vary from mild to severe. People with severe GAD can have intense waves of  anxiety with physical symptoms (panic attacks).  SYMPTOMS The anxiety and worry associated with GAD are difficult to control. This anxiety and worry are related to many life events and activities and also occur more days than not for 6 months or longer. People with GAD also have three or more of the following symptoms (one or more in children):  Restlessness.   Fatigue.  Difficulty concentrating.   Irritability.  Muscle tension.  Difficulty sleeping or unsatisfying sleep. DIAGNOSIS GAD is diagnosed through an assessment by your health care provider. Your health care provider will ask you questions aboutyour mood,physical symptoms, and events in your life. Your health care provider may ask you about your medical history and use of alcohol or drugs, including prescription medicines. Your health care provider may also do a physical exam and blood tests. Certain medical conditions and the use of certain substances can cause symptoms similar to those associated with GAD. Your health care provider may refer you to a mental health specialist for further evaluation. TREATMENT The following therapies are usually used to treat GAD:   Medication. Antidepressant medication usually is prescribed for long-term daily control. Antianxiety medicines may be added in severe cases, especially when panic attacks occur.   Talk therapy (psychotherapy). Certain types of talk therapy can be helpful in treating GAD by providing support, education, and guidance. A form of talk therapy called cognitive behavioral therapy can teach you healthy ways to think about and react to daily life events and activities.  Stress managementtechniques. These include yoga, meditation, and exercise and can be very helpful when they are practiced regularly. A mental health specialist can help determine which treatment is best for you. Some people see improvement with one therapy. However, other people require a combination of  therapies.   This information is not intended to replace advice given to you by your health care provider. Make sure you discuss any questions you have with your health care provider.   Document Released: 05/31/2012 Document Revised: 02/24/2014 Document Reviewed: 05/31/2012 Elsevier Interactive Patient Education 2016 Elsevier Inc. Generalized Anxiety Disorder Generalized anxiety disorder (GAD) is a mental disorder. It interferes with life functions, including relationships, work, and school. GAD is different from normal anxiety, which everyone experiences at some point in their lives in response to specific life events and activities. Normal anxiety actually helps [place] prepare for and get through these life events and activities. Normal anxiety goes away after the event or activity is over.  GAD causes anxiety that is not necessarily related to specific events or activities. It also causes excess anxiety in proportion to specific events or activities. The anxiety associated with GAD is also difficult to control. GAD can vary from mild to severe. People with severe GAD can have intense waves of anxiety with physical symptoms (panic attacks).  SYMPTOMS The anxiety and worry associated with GAD are difficult to control. This anxiety and worry are related to many life events and activities and also occur more days than not for 6 months or longer. People with GAD also have three or more of the following symptoms (one or more in children):  Restlessness.   Fatigue.  Difficulty concentrating.   Irritability.  Muscle tension.  Difficulty sleeping or unsatisfying sleep. DIAGNOSIS GAD is diagnosed through an assessment by your health care provider. Your health care provider will ask you questions aboutyour mood,physical symptoms, and events  in your life. Your health care provider may ask you about your medical history and use of alcohol or drugs, including prescription medicines. Your health care  provider may also do a physical exam and blood tests. Certain medical conditions and the use of certain substances can cause symptoms similar to those associated with GAD. Your health care provider may refer you to a mental health specialist for further evaluation. TREATMENT The following therapies are usually used to treat GAD:   Medication. Antidepressant medication usually is prescribed for long-term daily control. Antianxiety medicines may be added in severe cases, especially when panic attacks occur.   Talk therapy (psychotherapy). Certain types of talk therapy can be helpful in treating GAD by providing support, education, and guidance. A form of talk therapy called cognitive behavioral therapy can teach you healthy ways to think about and react to daily life events and activities.  Stress managementtechniques. These include yoga, meditation, and exercise and can be very helpful when they are practiced regularly. A mental health specialist can help determine which treatment is best for you. Some people see improvement with one therapy. However, other people require a combination of therapies.   This information is not intended to replace advice given to you by your health care provider. Make sure you discuss any questions you have with your health care provider.   Document Released: 05/31/2012 Document Revised: 02/24/2014 Document Reviewed: 05/31/2012 Elsevier Interactive Patient Education [employer].

## 2015-03-20 NOTE — Addendum Note (Signed)
Addended by: Evelina Dun A on: 03/20/2015 09:55 AM   Modules accepted: Orders

## 2015-04-19 ENCOUNTER — Ambulatory Visit (INDEPENDENT_AMBULATORY_CARE_PROVIDER_SITE_OTHER): Payer: Medicare Other | Admitting: Family

## 2015-04-19 ENCOUNTER — Encounter: Payer: Self-pay | Admitting: Family

## 2015-04-19 VITALS — BP 117/68 | HR 54 | Temp 96.8°F | Ht 63.0 in | Wt 183.2 lb

## 2015-04-19 DIAGNOSIS — F411 Generalized anxiety disorder: Secondary | ICD-10-CM

## 2015-04-19 NOTE — Progress Notes (Signed)
   Subjective:    Patient ID: Veronica Paul, female    DOB: [birth year], 80 y.o.   MRN: [id number]    HPI Pt presents to the office today to recheck SOB and GAD. PT was having intermittent periods of SOB. PT had a negative chest xray. PT was started on lexapro 10 mg daily and Flonase every night. PT states her SOB is greatly improved. PT states she is no longer feeling anxious or SOB. Pt denies any headache, palpitations, SOB, or edema at this time.    Review of Systems  Constitutional: Negative.   HENT: Negative.   Eyes: Negative.   Respiratory: Negative.  Negative for shortness of breath.   Cardiovascular: Negative.  Negative for palpitations.  Gastrointestinal: Negative.   Endocrine: Negative.   Genitourinary: Negative.   Musculoskeletal: Negative.   Neurological: Negative.  Negative for headaches.  Hematological: Negative.   Psychiatric/Behavioral: Negative.   All other systems reviewed and are negative.      Objective:   Physical Exam  Constitutional: She is oriented to person, place, and time. She appears well-developed and well-nourished. No distress.  HENT:  Head: Normocephalic and atraumatic.  Right Ear: External ear normal.  Left Ear: External ear normal.  Mouth/Throat: Oropharynx is clear and moist.  Eyes: Pupils are equal, round, and reactive to light.  Neck: Normal range of motion. Neck supple. No thyromegaly present.  Cardiovascular: Normal rate, regular rhythm, normal heart sounds and intact distal pulses.   No murmur heard. Pulmonary/Chest: Effort normal and breath sounds normal. No respiratory distress. She has no wheezes.  Abdominal: Soft. Bowel sounds are normal. She exhibits no distension. There is no tenderness.  Musculoskeletal: Normal range of motion. She exhibits no edema or tenderness.  Neurological: She is alert and oriented to person, place, and time.  Skin: Skin is warm and dry.  Psychiatric: She has a normal mood and affect. Her behavior is  normal. Judgment and thought content normal.  Vitals reviewed.    BP 117/68 mmHg  Pulse 54  Temp(Src) 96.8 F (36 C) (Oral)  Ht '5\' 3"'$  (1.6 m)  Wt 183 lb 3.2 oz (83.099 kg)  BMI 32.46 kg/m2  LMP 05/11/1992      Assessment & Plan:  1. GAD (generalized anxiety disorder) -Stress management discussed -continue Lexapro  Mg daiy   Continue all meds Labs pending Health Maintenance reviewed Diet and exercise encouraged RTO 3 ,months for chronic follow up  Evelina Dun, FNP

## 2015-04-19 NOTE — Patient Instructions (Addendum)
Generalized Anxiety Disorder Generalized anxiety disorder (GAD) is a mental disorder. It interferes with life functions, including relationships, work, and school. GAD is different from normal anxiety, which everyone experiences at some point in their lives in response to specific life events and activities. Normal anxiety actually helps us prepare for and get through these life events and activities. Normal anxiety goes away after the event or activity is over.  GAD causes anxiety that is not necessarily related to specific events or activities. It also causes excess anxiety in proportion to specific events or activities. The anxiety associated with GAD is also difficult to control. GAD can vary from mild to severe. People with severe GAD can have intense waves of anxiety with physical symptoms (panic attacks).  SYMPTOMS The anxiety and worry associated with GAD are difficult to control. This anxiety and worry are related to many life events and activities and also occur more days than not for 6 months or longer. People with GAD also have three or more of the following symptoms (one or more in children):  Restlessness.   Fatigue.  Difficulty concentrating.   Irritability.  Muscle tension.  Difficulty sleeping or unsatisfying sleep. DIAGNOSIS GAD is diagnosed through an assessment by your health care provider. Your health care provider will ask you questions aboutyour mood,physical symptoms, and events in your life. Your health care provider may ask you about your medical history and use of alcohol or drugs, including prescription medicines. Your health care provider may also do a physical exam and blood tests. Certain medical conditions and the use of certain substances can cause symptoms similar to those associated with GAD. Your health care provider may refer you to a mental health specialist for further evaluation. TREATMENT The following therapies are usually used to treat GAD:    Medication. Antidepressant medication usually is prescribed for long-term daily control. Antianxiety medicines may be added in severe cases, especially when panic attacks occur.   Talk therapy (psychotherapy). Certain types of talk therapy can be helpful in treating GAD by providing support, education, and guidance. A form of talk therapy called cognitive behavioral therapy can teach you healthy ways to think about and react to daily life events and activities.  Stress managementtechniques. These include yoga, meditation, and exercise and can be very helpful when they are practiced regularly. A mental health specialist can help determine which treatment is best for you. Some people see improvement with one therapy. However, other people require a combination of therapies.   This information is not intended to replace advice given to you by your health care provider. Make sure you discuss any questions you have with your health care provider.   Document Released: 05/31/2012 Document Revised: 02/24/2014 Document Reviewed: 05/31/2012 Elsevier Interactive Patient Education 2016 Elsevier Inc.  

## 2015-05-03 ENCOUNTER — Other Ambulatory Visit: Payer: Self-pay | Admitting: Nurse Practitioner

## 2015-05-03 NOTE — Telephone Encounter (Signed)
Called to [facility]

## 2015-05-03 NOTE — Telephone Encounter (Signed)
Seen 04/19/15  [name]  If approved route to nurse to call into [facility]

## 2015-05-08 ENCOUNTER — Ambulatory Visit: Payer: Medicare Other | Admitting: Nurse Practitioner

## 2015-05-09 ENCOUNTER — Encounter: Payer: Self-pay | Admitting: Family

## 2015-05-17 ENCOUNTER — Encounter: Payer: Self-pay | Admitting: Nurse Practitioner

## 2015-05-17 ENCOUNTER — Ambulatory Visit (INDEPENDENT_AMBULATORY_CARE_PROVIDER_SITE_OTHER): Payer: Medicare Other | Admitting: Nurse Practitioner

## 2015-05-17 VITALS — BP 123/73 | HR 56 | Temp 96.8°F | Ht 63.0 in | Wt 183.0 lb

## 2015-05-17 DIAGNOSIS — E785 Hyperlipidemia, unspecified: Secondary | ICD-10-CM

## 2015-05-17 DIAGNOSIS — I10 Essential (primary) hypertension: Secondary | ICD-10-CM

## 2015-05-17 DIAGNOSIS — E559 Vitamin D deficiency, unspecified: Secondary | ICD-10-CM

## 2015-05-17 DIAGNOSIS — E119 Type 2 diabetes mellitus without complications: Secondary | ICD-10-CM | POA: Diagnosis not present

## 2015-05-17 DIAGNOSIS — R609 Edema, unspecified: Secondary | ICD-10-CM

## 2015-05-17 DIAGNOSIS — E039 Hypothyroidism, unspecified: Secondary | ICD-10-CM

## 2015-05-17 DIAGNOSIS — F411 Generalized anxiety disorder: Secondary | ICD-10-CM | POA: Diagnosis not present

## 2015-05-17 LAB — BAYER DCA HB A1C WAIVED: HB A1C (BAYER DCA - WAIVED): 5.7 % (ref <7.0)

## 2015-05-17 MED ORDER — ESCITALOPRAM OXALATE 10 MG PO TABS: 10.0000 mg | ORAL_TABLET | Freq: Every day | ORAL | Status: DC

## 2015-05-17 MED ORDER — AMLODIPINE BESYLATE 5 MG PO TABS: 5.0000 mg | ORAL_TABLET | Freq: Every day | ORAL | Status: DC

## 2015-05-17 MED ORDER — FUROSEMIDE 40 MG PO TABS: 40.0000 mg | ORAL_TABLET | Freq: Every day | ORAL | Status: DC

## 2015-05-17 MED ORDER — METOPROLOL SUCCINATE ER 200 MG PO TB24: ORAL_TABLET | ORAL | Status: DC

## 2015-05-17 MED ORDER — METFORMIN HCL 500 MG PO TABS: ORAL_TABLET | ORAL | Status: DC

## 2015-05-17 MED ORDER — LOSARTAN POTASSIUM 100 MG PO TABS: 100.0000 mg | ORAL_TABLET | Freq: Every day | ORAL | Status: DC

## 2015-05-17 MED ORDER — SIMVASTATIN 40 MG PO TABS: ORAL_TABLET | ORAL | Status: DC

## 2015-05-17 NOTE — Progress Notes (Signed)
Subjective:    Patient ID: Veronica Paul, female    DOB: [birth year], 80 y.o.   MRN: [id number]  Patient here today for follow up of chronic medical problems.  Outpatient Encounter Prescriptions as of 05/17/2015  Medication Sig  . amLODipine (NORVASC) 5 MG tablet Take 1 tablet (5 mg total) by mouth daily.  Marland Kitchen aspirin EC 81 MG EC tablet Take 81 mg by mouth daily.    Marland Kitchen BLACK COHOSH PO Take by mouth.    . clonazePAM (KLONOPIN) 0.5 MG tablet TAKE  (1)  TABLET TWICE A DAY AS NEEDED.  Marland Kitchen escitalopram (LEXAPRO) 10 MG tablet Take 1 tablet (10 mg total) by mouth daily.  . fish oil-omega-3 fatty acids 1000 MG capsule Take 2 g by mouth daily.    . fluticasone (FLONASE) 50 MCG/ACT nasal spray Place 2 sprays into both nostrils daily.  . furosemide (LASIX) 40 MG tablet Take 1 tablet (40 mg total) by mouth daily.  Marland Kitchen glucose blood test strip Check BS BID and PRN . DX.E11.9  . Lancets (ONETOUCH ULTRASOFT) lancets Check BS BID and PRN. DX. E11.9  . levothyroxine (SYNTHROID, LEVOTHROID) 88 MCG tablet Take 1 tablet (88 mcg total) by mouth daily before breakfast.  . losartan (COZAAR) 100 MG tablet Take 1 tablet (100 mg total) by mouth daily.  . metFORMIN (GLUCOPHAGE) 500 MG tablet TAKE (1) TABLET TWICE A DAY WITH MEALS (BREAKFAST AND SUPPER)  . metoprolol (TOPROL-XL) 200 MG 24 hr tablet TAKE 1 & 1/2 TABLETS ONCE DAILY  . nitroGLYCERIN (NITROSTAT) 0.4 MG SL tablet Place 1 tablet (0.4 mg total) under the tongue every 5 (five) minutes as needed for chest pain.  . simvastatin (ZOCOR) 40 MG tablet Take 1 tablet (40 mg total) by mouth at bedtime.  . Vitamin D, Ergocalciferol, (DRISDOL) 50000 UNITS CAPS capsule TAKE ONE CAPSULE BY MOUTH WEEKLY (Patient not taking: Reported on 05/17/2015)   No facility-administered encounter medications on file as of 05/17/2015.      Diabetes She presents for her follow-up diabetic visit. She has type 2 diabetes mellitus. No MedicAlert identification noted. There are no hypoglycemic  associated symptoms. Pertinent negatives for hypoglycemia include no headaches. Pertinent negatives for diabetes include no fatigue, no polydipsia, no polyphagia, no polyuria and no visual change. Symptoms are stable. There are no diabetic complications. Risk factors for coronary artery disease include diabetes mellitus, dyslipidemia, hypertension, obesity and post-menopausal. When asked about current treatments, none were reported. Her weight is stable. She is following a generally healthy diet. When asked about meal planning, she reported none. She has not had a previous visit with a dietitian. She rarely participates in exercise. Her breakfast blood glucose range is generally 90-110 mg/dl. Her highest blood glucose is 110-130 mg/dl. Her overall blood glucose range is 90-110 mg/dl. An ACE inhibitor/angiotensin II receptor blocker is being taken. She does not see a podiatrist.Eye exam is current.  Hypertension This is a chronic problem. The current episode started more than 1 year ago. The problem is controlled. Pertinent negatives include no headaches or palpitations. Risk factors for coronary artery disease include diabetes mellitus, dyslipidemia, obesity, post-menopausal state and sedentary lifestyle. Past treatments include angiotensin blockers and calcium channel blockers. The current treatment provides moderate improvement. Compliance problems include diet and exercise.  Hypertensive end-organ damage includes a thyroid problem.  Thyroid Problem Visit type: hypothyroidism. Patient reports no constipation, diaphoresis, fatigue, palpitations or visual change. The symptoms have been stable. Her past medical history is significant for hyperlipidemia.  Hyperlipidemia This  is a chronic problem. The current episode started more than 1 year ago. The problem is uncontrolled. Recent lipid tests were reviewed and are high. Pertinent negatives include no myalgias. Current antihyperlipidemic treatment includes  statins (lipitor is causing legs to hurt). The current treatment provides moderate improvement of lipids. Compliance problems include adherence to diet and adherence to exercise.   GAD Just started on clonazepam at last visit has really help her c/o chest pain at last visit- SHe only takes as needed.  Review of Systems  Constitutional: Negative.  Negative for diaphoresis and fatigue.  HENT: Negative.   Respiratory: Negative.   Cardiovascular: Negative for palpitations.  Gastrointestinal: Negative for constipation.  Endocrine: Negative for polydipsia, polyphagia and polyuria.  Genitourinary: Negative.   Musculoskeletal: Negative for myalgias.  Neurological: Negative.  Negative for headaches.  Psychiatric/Behavioral: Negative.   All other systems reviewed and are negative.      Objective:   Physical Exam  Constitutional: She is oriented to person, place, and time. She appears well-developed and well-nourished.  HENT:  Nose: Nose normal.  Mouth/Throat: Oropharynx is clear and moist.  Eyes: EOM are normal.  Neck: Trachea normal, normal range of motion and full passive range of motion without pain. Neck supple. No JVD present. Carotid bruit is not present. No thyromegaly present.  Cardiovascular: Normal rate, regular rhythm, normal heart sounds and intact distal pulses.  Exam reveals no gallop and no friction rub.   No murmur heard. Pulmonary/Chest: Effort normal and breath sounds normal.  Abdominal: Soft. Bowel sounds are normal. She exhibits no distension and no mass. There is no tenderness.  Musculoskeletal: Normal range of motion.  Lymphadenopathy:    She has no cervical adenopathy.  Neurological: She is alert and oriented to person, place, and time. She has normal reflexes.  Positive 4/4 monofilament bil feet.  Skin: Skin is warm and dry.  No callus formation bil feet  Psychiatric: She has a normal mood and affect. Her behavior is normal. Judgment and thought content normal.    BP 123/73 mmHg  Pulse 56  Temp(Src) 96.8 F (36 C) (Oral)  Ht 5' 3" (1.6 m)  Wt 183 lb (83.008 kg)  BMI 32.43 kg/m2  LMP 05/11/1992  hgba1c-5.7%- down from 6.4%       Assessment & Plan:   1. Essential hypertension Do not add salt to diet - CMP14+EGFR - losartan (COZAAR) 100 MG tablet; Take 1 tablet (100 mg total) by mouth daily.  Dispense: 30 tablet; Refill: 5 - amLODipine (NORVASC) 5 MG tablet; Take 1 tablet (5 mg total) by mouth daily.  Dispense: 30 tablet; Refill: 5 - metoprolol (TOPROL-XL) 200 MG 24 hr tablet; TAKE 1 & 1/2 TABLETS ONCE DAILY  Dispense: 45 tablet; Refill: 5  2. Diabetes mellitus without complication ([place]) Continue to watch carbs in diet - Bayer DCA Hb A1c Waived - Microalbumin / creatinine urine ratio - metFORMIN (GLUCOPHAGE) 500 MG tablet; TAKE (1) TABLET TWICE A DAY WITH MEALS (BREAKFAST AND SUPPER)  Dispense: 180 tablet; Refill: 1  3. Dyslipidemia Low fat diet - Lipid panel - simvastatin (ZOCOR) 40 MG tablet; Take 1 tablet (40 mg total) by mouth at bedtime.  Dispense: 90 tablet; Refill: 1  4. Vitamin D deficiency  5. GAD (generalized anxiety disorder) Stress management - escitalopram (LEXAPRO) 10 MG tablet; Take 1 tablet (10 mg total) by mouth daily.  Dispense: 30 tablet; Refill: 5  6. Hypothyroidism, unspecified hypothyroidism type - Thyroid Panel With TSH  7. Peripheral edema - furosemide (  LASIX) 40 MG tablet; Take 1 tablet (40 mg total) by mouth daily.  Dispense: 30 tablet; Refill: 5    Labs pending Health maintenance reviewed Diet and exercise encouraged Continue all meds Follow up  In 3 month   [place], FNP

## 2015-05-17 NOTE — Patient Instructions (Signed)
Diabetes and Foot Care Diabetes may cause you to have problems because of poor blood supply (circulation) to your feet and legs. This may cause the skin on your feet to become thinner, break easier, and heal more slowly. Your skin may become dry, and the skin may peel and crack. You may also have nerve damage in your legs and feet causing decreased feeling in them. You may not notice minor injuries to your feet that could lead to infections or more serious problems. Taking care of your feet is one of the most important things you can do for yourself.  HOME CARE INSTRUCTIONS  Wear shoes at all times, even in the house. Do not go barefoot. Bare feet are easily injured.  Check your feet daily for blisters, cuts, and redness. If you cannot see the bottom of your feet, use a mirror or ask someone for help.  Wash your feet with warm water (do not use hot water) and mild soap. Then pat your feet and the areas between your toes until they are completely dry. Do not soak your feet as this can dry your skin.  Apply a moisturizing lotion or petroleum jelly (that does not contain alcohol and is unscented) to the skin on your feet and to dry, brittle toenails. Do not apply lotion between your toes.  Trim your toenails straight across. Do not dig under them or around the cuticle. File the edges of your nails with an emery board or nail file.  Do not cut corns or calluses or try to remove them with medicine.  Wear clean socks or stockings every day. Make sure they are not too tight. Do not wear knee-high stockings since they may decrease blood flow to your legs.  Wear shoes that fit properly and have enough cushioning. To break in new shoes, wear them for just a few hours a day. This prevents you from injuring your feet. Always look in your shoes before you put them on to be sure there are no objects inside.  Do not cross your legs. This may decrease the blood flow to your feet.  If you find a minor scrape,  cut, or break in the skin on your feet, keep it and the skin around it clean and dry. These areas may be cleansed with mild soap and water. Do not cleanse the area with peroxide, alcohol, or iodine.  When you remove an adhesive bandage, be sure not to damage the skin around it.  If you have a wound, look at it several times a day to make sure it is healing.  Do not use heating pads or hot water bottles. They may burn your skin. If you have lost feeling in your feet or legs, you may not know it is happening until it is too late.  Make sure your health care provider performs a complete foot exam at least annually or more often if you have foot problems. Report any cuts, sores, or bruises to your health care provider immediately. SEEK MEDICAL CARE IF:   You have an injury that is not healing.  You have cuts or breaks in the skin.  You have an ingrown nail.  You notice redness on your legs or feet.  You feel burning or tingling in your legs or feet.  You have pain or cramps in your legs and feet.  Your legs or feet are numb.  Your feet always feel cold. SEEK IMMEDIATE MEDICAL CARE IF:   There is increasing redness,   swelling, or pain in or around a wound.  There is a red line that goes up your leg.  Pus is coming from a wound.  You develop a fever or as directed by your health care provider.  You notice a bad smell coming from an ulcer or wound.   This information is not intended to replace advice given to you by your health care provider. Make sure you discuss any questions you have with your health care provider.   Document Released: 02/01/2000 Document Revised: 10/06/2012 Document Reviewed: 07/13/2012 Elsevier Interactive Patient Education 2016 Elsevier Inc.  

## 2015-05-18 LAB — THYROID PANEL WITH TSH
FREE THYROXINE INDEX: 2.3 (ref 1.2–4.9)
T3 Uptake Ratio: 26 % (ref 24–39)
T4 TOTAL: 8.8 ug/dL (ref 4.5–12.0)
TSH: 0.623 u[IU]/mL (ref 0.450–4.500)

## 2015-05-18 LAB — LIPID PANEL
CHOLESTEROL TOTAL: 165 mg/dL (ref 100–199)
Chol/HDL Ratio: 2.8 ratio units (ref 0.0–4.4)
HDL: 60 mg/dL (ref >39)
LDL Calculated: 86 mg/dL (ref 0–99)
TRIGLYCERIDES: 94 mg/dL (ref 0–149)
VLDL Cholesterol Cal: 19 mg/dL (ref 5–40)

## 2015-05-18 LAB — MICROALBUMIN / CREATININE URINE RATIO
Creatinine, Urine: 170.3 mg/dL
MICROALB/CREAT RATIO: 35 mg/g{creat} — AB (ref 0.0–30.0)
MICROALBUM., U, RANDOM: 59.6 ug/mL

## 2015-05-18 LAB — CMP14+EGFR
A/G RATIO: 2 (ref 1.2–2.2)
ALBUMIN: 4.3 g/dL (ref 3.5–4.8)
ALK PHOS: 58 IU/L (ref 39–117)
ALT: 14 IU/L (ref 0–32)
AST: 14 IU/L (ref 0–40)
BUN / CREAT RATIO: 13 (ref 11–26)
BUN: 11 mg/dL (ref 8–27)
Bilirubin Total: 0.5 mg/dL (ref 0.0–1.2)
CO2: 29 mmol/L (ref 18–29)
Calcium: 9 mg/dL (ref 8.7–10.3)
Chloride: 100 mmol/L (ref 96–106)
Creatinine, Ser: 0.87 mg/dL (ref 0.57–1.00)
GFR calc Af Amer: 74 mL/min/{1.73_m2} (ref >59)
GFR, EST NON AFRICAN AMERICAN: 64 mL/min/{1.73_m2} (ref >59)
GLOBULIN, TOTAL: 2.1 g/dL (ref 1.5–4.5)
Glucose: 95 mg/dL (ref 65–99)
POTASSIUM: 4.1 mmol/L (ref 3.5–5.2)
SODIUM: 144 mmol/L (ref 134–144)
Total Protein: 6.4 g/dL (ref 6.0–8.5)

## 2015-09-11 ENCOUNTER — Encounter: Payer: Self-pay | Admitting: Nurse Practitioner

## 2015-09-11 ENCOUNTER — Ambulatory Visit (INDEPENDENT_AMBULATORY_CARE_PROVIDER_SITE_OTHER): Payer: Medicare Other | Admitting: Nurse Practitioner

## 2015-09-11 ENCOUNTER — Ambulatory Visit: Payer: Medicare Other | Admitting: Nurse Practitioner

## 2015-09-11 VITALS — BP 132/84 | HR 57 | Temp 96.6°F | Ht 63.0 in | Wt 194.0 lb

## 2015-09-11 DIAGNOSIS — R609 Edema, unspecified: Secondary | ICD-10-CM | POA: Diagnosis not present

## 2015-09-11 DIAGNOSIS — M8580 Other specified disorders of bone density and structure, unspecified site: Secondary | ICD-10-CM

## 2015-09-11 DIAGNOSIS — E785 Hyperlipidemia, unspecified: Secondary | ICD-10-CM | POA: Diagnosis not present

## 2015-09-11 DIAGNOSIS — E039 Hypothyroidism, unspecified: Secondary | ICD-10-CM | POA: Diagnosis not present

## 2015-09-11 DIAGNOSIS — Z6834 Body mass index (BMI) 34.0-34.9, adult: Secondary | ICD-10-CM | POA: Diagnosis not present

## 2015-09-11 DIAGNOSIS — I10 Essential (primary) hypertension: Secondary | ICD-10-CM

## 2015-09-11 DIAGNOSIS — E119 Type 2 diabetes mellitus without complications: Secondary | ICD-10-CM

## 2015-09-11 DIAGNOSIS — F411 Generalized anxiety disorder: Secondary | ICD-10-CM | POA: Diagnosis not present

## 2015-09-11 DIAGNOSIS — F329 Major depressive disorder, single episode, unspecified: Secondary | ICD-10-CM | POA: Diagnosis not present

## 2015-09-11 DIAGNOSIS — F32A Depression, unspecified: Secondary | ICD-10-CM

## 2015-09-11 LAB — BAYER DCA HB A1C WAIVED: HB A1C (BAYER DCA - WAIVED): 5.7 % (ref <7.0)

## 2015-09-11 MED ORDER — METOPROLOL SUCCINATE ER 200 MG PO TB24: ORAL_TABLET | ORAL | 1 refills | Status: DC

## 2015-09-11 MED ORDER — LOSARTAN POTASSIUM 100 MG PO TABS: 100.0000 mg | ORAL_TABLET | Freq: Every day | ORAL | 1 refills | Status: DC

## 2015-09-11 MED ORDER — ESCITALOPRAM OXALATE 20 MG PO TABS: 20.0000 mg | ORAL_TABLET | Freq: Every day | ORAL | 1 refills | Status: DC

## 2015-09-11 MED ORDER — CLONAZEPAM 0.5 MG PO TABS: 0.5000 mg | ORAL_TABLET | Freq: Two times a day (BID) | ORAL | 1 refills | Status: DC

## 2015-09-11 MED ORDER — LEVOTHYROXINE SODIUM 88 MCG PO TABS: ORAL_TABLET | ORAL | 1 refills | Status: DC

## 2015-09-11 MED ORDER — SIMVASTATIN 40 MG PO TABS: ORAL_TABLET | ORAL | 1 refills | Status: DC

## 2015-09-11 MED ORDER — AMLODIPINE BESYLATE 5 MG PO TABS: 5.0000 mg | ORAL_TABLET | Freq: Every day | ORAL | 5 refills | Status: DC

## 2015-09-11 MED ORDER — METFORMIN HCL 500 MG PO TABS: ORAL_TABLET | ORAL | 1 refills | Status: DC

## 2015-09-11 MED ORDER — FUROSEMIDE 40 MG PO TABS: 40.0000 mg | ORAL_TABLET | Freq: Every day | ORAL | 1 refills | Status: DC

## 2015-09-11 NOTE — Progress Notes (Signed)
Subjective:    Patient ID: Veronica Paul, female    DOB: 11-29-1935, 80 y.o.   MRN: [id number]  Patient here today for follow up of chronic medical problems. She is doing well without complaints.  Outpatient Encounter Prescriptions as of 09/11/2015  Medication Sig  . amLODipine (NORVASC) 5 MG tablet Take 1 tablet (5 mg total) by mouth daily.  Marland Kitchen aspirin EC 81 MG EC tablet Take 81 mg by mouth daily.    Marland Kitchen BLACK COHOSH PO Take by mouth.    . clonazePAM (KLONOPIN) 0.5 MG tablet TAKE  (1)  TABLET TWICE A DAY AS NEEDED.  Marland Kitchen escitalopram (LEXAPRO) 10 MG tablet Take 1 tablet (10 mg total) by mouth daily.  . fish oil-omega-3 fatty acids 1000 MG capsule Take 2 g by mouth daily.    . fluticasone (FLONASE) 50 MCG/ACT nasal spray Place 2 sprays into both nostrils daily.  . furosemide (LASIX) 40 MG tablet Take 1 tablet (40 mg total) by mouth daily.  Marland Kitchen glucose blood test strip Check BS BID and PRN . DX.E11.9  . Lancets (ONETOUCH ULTRASOFT) lancets Check BS BID and PRN. DX. E11.9  . levothyroxine (SYNTHROID, LEVOTHROID) 88 MCG tablet Take 1 tablet (88 mcg total) by mouth daily before breakfast.  . losartan (COZAAR) 100 MG tablet Take 1 tablet (100 mg total) by mouth daily.  . metFORMIN (GLUCOPHAGE) 500 MG tablet TAKE (1) TABLET TWICE A DAY WITH MEALS (BREAKFAST AND SUPPER)  . metoprolol (TOPROL-XL) 200 MG 24 hr tablet TAKE 1 & 1/2 TABLETS ONCE DAILY  . nitroGLYCERIN (NITROSTAT) 0.4 MG SL tablet Place 1 tablet (0.4 mg total) under the tongue every 5 (five) minutes as needed for chest pain.  . simvastatin (ZOCOR) 40 MG tablet Take 1 tablet (40 mg total) by mouth at bedtime.  . Vitamin D, Ergocalciferol, (DRISDOL) 50000 UNITS CAPS capsule TAKE ONE CAPSULE BY MOUTH WEEKLY (Patient not taking: Reported on 05/17/2015)   No facility-administered encounter medications on file as of 09/11/2015.     Diabetes  She presents for her follow-up diabetic visit. She has type 2 diabetes mellitus. No MedicAlert  identification noted. There are no hypoglycemic associated symptoms. Pertinent negatives for hypoglycemia include no headaches. Pertinent negatives for diabetes include no fatigue, no polydipsia, no polyphagia, no polyuria and no visual change. Symptoms are stable. There are no diabetic complications. Risk factors for coronary artery disease include diabetes mellitus, dyslipidemia, hypertension, obesity and post-menopausal. When asked about current treatments, none were reported. Her weight is stable. She is following a generally healthy diet. When asked about meal planning, she reported none. She has not had a previous visit with a dietitian. She rarely participates in exercise. Home blood sugar record trend: she does not check blood sugars everyday. Her breakfast blood glucose range is generally 130-140 mg/dl. Her overall blood glucose range is 90-110 mg/dl. An ACE inhibitor/angiotensin II receptor blocker is being taken. She does not see a podiatrist.Eye exam is current.  Hypertension  This is a chronic problem. The current episode started more than 1 year ago. The problem is controlled. Pertinent negatives include no headaches or palpitations. Risk factors for coronary artery disease include diabetes mellitus, dyslipidemia, obesity, post-menopausal state and sedentary lifestyle. Past treatments include angiotensin blockers and calcium channel blockers. The current treatment provides moderate improvement. Compliance problems include diet and exercise.  Hypertensive end-organ damage includes a thyroid problem.  Thyroid Problem  Visit type: hypothyroidism. Patient reports no constipation, diaphoresis, fatigue, palpitations or visual change. The symptoms have  been stable. Her past medical history is significant for hyperlipidemia.  Hyperlipidemia  This is a chronic problem. The current episode started more than 1 year ago. The problem is uncontrolled. Recent lipid tests were reviewed and are high. Associated  symptoms include myalgias. Treatments tried: patient stooped taking simvastatin because of myalgia- lipitor and crestor caused same problem. The current treatment provides moderate improvement of lipids. Compliance problems include adherence to diet and adherence to exercise.   GAD clonazepam works well when she needs it- has only used a couple of times since last visit. depression SHe is currently on lexapro '10mg'$  daily and does not feel like it is working anymore.        osteopenia She is currently not taking anything for this- she tries to do weight bearing exercises but not always able.   Review of Systems  Constitutional: Negative.  Negative for diaphoresis and fatigue.  HENT: Negative.   Respiratory: Negative.   Cardiovascular: Negative for palpitations.  Gastrointestinal: Negative for constipation.  Endocrine: Negative for polydipsia, polyphagia and polyuria.  Genitourinary: Negative.   Musculoskeletal: Positive for myalgias.  Neurological: Negative.  Negative for headaches.  Psychiatric/Behavioral: Negative.   All other systems reviewed and are negative.      Objective:   Physical Exam  Constitutional: She is oriented to person, place, and time. She appears well-developed and well-nourished.  HENT:  Nose: Nose normal.  Mouth/Throat: Oropharynx is clear and moist.  Eyes: EOM are normal.  Neck: Trachea normal, normal range of motion and full passive range of motion without pain. Neck supple. No JVD present. Carotid bruit is not present. No thyromegaly present.  Cardiovascular: Normal rate, regular rhythm, normal heart sounds and intact distal pulses.  Exam reveals no gallop and no friction rub.   No murmur heard. Pulmonary/Chest: Effort normal and breath sounds normal.  Abdominal: Soft. Bowel sounds are normal. She exhibits no distension and no mass. There is no tenderness.  Musculoskeletal: Normal range of motion.  Lymphadenopathy:    She has no cervical adenopathy.   Neurological: She is alert and oriented to person, place, and time. She has normal reflexes.  Positive 4/4 monofilament bil feet.  Skin: Skin is warm and dry.  No callus formation bil feet  Psychiatric: She has a normal mood and affect. Her behavior is normal. Judgment and thought content normal.   BP 132/84 (BP Location: Left Arm, Cuff Size: Normal)   Pulse (!) 57   Temp (!) 96.6 F (35.9 C) (Oral)   Ht '5\' 3"'$  (1.6 m)   Wt 194 lb (88 kg)   LMP 05/11/1992   BMI 34.37 kg/m    hgba1c 5.7%        Assessment & Plan:   1. Essential hypertension Do not add salt to diet - CMP14+EGFR - metoprolol (TOPROL-XL) 200 MG 24 hr tablet; TAKE 1 & 1/2 TABLETS ONCE DAILY  Dispense: 135 tablet; Refill: 1 - losartan (COZAAR) 100 MG tablet; Take 1 tablet (100 mg total) by mouth daily.  Dispense: 90 tablet; Refill: 1 - amLODipine (NORVASC) 5 MG tablet; Take 1 tablet (5 mg total) by mouth daily.  Dispense: 30 tablet; Refill: 5  2. Diabetes mellitus without complication ([place]) Continue  To watch carbs in diet - Bayer DCA Hb A1c Waived - metFORMIN (GLUCOPHAGE) 500 MG tablet; TAKE (1) TABLET TWICE A DAY WITH MEALS (BREAKFAST AND SUPPER)  Dispense: 180 tablet; Refill: 1  3. Dyslipidemia Low fat diet Cannot tolerate statins - Lipid panel  4. Hypothyroidism,  unspecified hypothyroidism type - levothyroxine (SYNTHROID, LEVOTHROID) 88 MCG tablet; Take 1 tablet (88 mcg total) by mouth daily before breakfast.  Dispense: 90 tablet; Refill: 1 - Thyroid Panel With TSH  5. Osteopenia Weight bearing exercise  6. BMI 34.0-34.9,adult Discussed diet and exercise for person with BMI >25 Will recheck weight in 3-6 months  7. Depression Stress management Increased lexapro frm 54m to 266mtoday - escitalopram (LEXAPRO) 20 MG tablet; Take 1 tablet (20 mg total) by mouth daily.  Dispense: 90 tablet; Refill: 1  8. Peripheral edema Elevate legs when sitting - furosemide (LASIX) 40 MG tablet; Take 1 tablet  (40 mg total) by mouth daily.  Dispense: 90 tablet; Refill: 1  9. GAD (generalized anxiety disorder) - clonazePAM (KLONOPIN) 0.5 MG tablet; Take 1 tablet (0.5 mg total) by mouth 2 (two) times daily.  Dispense: 60 tablet; Refill: 1    Labs pending Health maintenance reviewed Diet and exercise encouraged Continue all meds Follow up  In 3 months   MaCandlewick LakeFNP

## 2015-09-11 NOTE — Patient Instructions (Signed)
Stress and Stress Management Stress is a normal reaction to life events. It is what you feel when life demands more than you are used to or more than you can handle. Some stress can be useful. For example, the stress reaction can help you catch the last bus of the day, study for a test, or meet a deadline at work. But stress that occurs too often or for too long can cause problems. It can affect your emotional health and interfere with relationships and normal daily activities. Too much stress can weaken your immune system and increase your risk for physical illness. If you already have a medical problem, stress can make it worse. CAUSES  All sorts of life events may cause stress. An event that causes stress for one person may not be stressful for another person. Major life events commonly cause stress. These may be positive or negative. Examples include losing your job, moving into a new home, getting married, having a baby, or losing a loved one. Less obvious life events may also cause stress, especially if they occur day after day or in combination. Examples include working long hours, driving in traffic, caring for children, being in debt, or being in a difficult relationship. SIGNS AND SYMPTOMS Stress may cause emotional symptoms including, the following:  Anxiety. This is feeling worried, afraid, on edge, overwhelmed, or out of control.  Anger. This is feeling irritated or impatient.  Depression. This is feeling sad, down, helpless, or guilty.  Difficulty focusing, remembering, or making decisions. Stress may cause physical symptoms, including the following:   Aches and pains. These may affect your head, neck, back, stomach, or other areas of your body.  Tight muscles or clenched jaw.  Low energy or trouble sleeping. Stress may cause unhealthy behaviors, including the following:   Eating to feel better (overeating) or skipping meals.  Sleeping too little, too much, or both.  Working  too much or putting off tasks (procrastination).  Smoking, drinking alcohol, or using drugs to feel better. DIAGNOSIS  Stress is diagnosed through an assessment by your health care provider. Your health care provider will ask questions about your symptoms and any stressful life events.Your health care provider will also ask about your medical history and may order blood tests or other tests. Certain medical conditions and medicine can cause physical symptoms similar to stress. Mental illness can cause emotional symptoms and unhealthy behaviors similar to stress. Your health care provider may refer you to a mental health professional for further evaluation.  TREATMENT  Stress management is the recommended treatment for stress.The goals of stress management are reducing stressful life events and coping with stress in healthy ways.  Techniques for reducing stressful life events include the following:  Stress identification. Self-monitor for stress and identify what causes stress for you. These skills may help you to avoid some stressful events.  Time management. Set your priorities, keep a calendar of events, and learn to say "no." These tools can help you avoid making too many commitments. Techniques for coping with stress include the following:  Rethinking the problem. Try to think realistically about stressful events rather than ignoring them or overreacting. Try to find the positives in a stressful situation rather than focusing on the negatives.  Exercise. Physical exercise can release both physical and emotional tension. The key is to find a form of exercise you enjoy and do it regularly.  Relaxation techniques. These relax the body and mind. Examples include yoga, meditation, tai chi, biofeedback, deep  breathing, progressive muscle relaxation, listening to music, being out in nature, journaling, and other hobbies. Again, the key is to find one or more that you enjoy and can do  regularly.  Healthy lifestyle. Eat a balanced diet, get plenty of sleep, and do not smoke. Avoid using alcohol or drugs to relax.  Strong support network. Spend time with family, friends, or other people you enjoy being around.Express your feelings and talk things over with someone you trust. Counseling or talktherapy with a mental health professional may be helpful if you are having difficulty managing stress on your own. Medicine is typically not recommended for the treatment of stress.Talk to your health care provider if you think you need medicine for symptoms of stress. HOME CARE INSTRUCTIONS  Keep all follow-up visits as directed by your health care provider.  Take all medicines as directed by your health care provider. SEEK MEDICAL CARE IF:  Your symptoms get worse or you start having new symptoms.  You feel overwhelmed by your problems and can no longer manage them on your own. SEEK IMMEDIATE MEDICAL CARE IF:  You feel like hurting yourself or someone else.   This information is not intended to replace advice given to you by your health care provider. Make sure you discuss any questions you have with your health care provider.   Document Released: 07/30/2000 Document Revised: 02/24/2014 Document Reviewed: 09/28/2012 Elsevier Interactive Patient Education 2016 [employer]  

## 2015-09-12 LAB — CMP14+EGFR
ALBUMIN: 4.2 g/dL (ref 3.5–4.8)
ALT: 14 IU/L (ref 0–32)
AST: 16 IU/L (ref 0–40)
Albumin/Globulin Ratio: 1.7 (ref 1.2–2.2)
Alkaline Phosphatase: 56 IU/L (ref 39–117)
BUN / CREAT RATIO: 14 (ref 12–28)
BUN: 13 mg/dL (ref 8–27)
Bilirubin Total: 0.4 mg/dL (ref 0.0–1.2)
CALCIUM: 9.6 mg/dL (ref 8.7–10.3)
CHLORIDE: 101 mmol/L (ref 96–106)
CO2: 29 mmol/L (ref 18–29)
CREATININE: 0.92 mg/dL (ref 0.57–1.00)
GFR, EST AFRICAN AMERICAN: 69 mL/min/{1.73_m2} (ref >59)
GFR, EST NON AFRICAN AMERICAN: 60 mL/min/{1.73_m2} (ref >59)
GLOBULIN, TOTAL: 2.5 g/dL (ref 1.5–4.5)
GLUCOSE: 96 mg/dL (ref 65–99)
Potassium: 5.3 mmol/L — ABNORMAL HIGH (ref 3.5–5.2)
SODIUM: 143 mmol/L (ref 134–144)
TOTAL PROTEIN: 6.7 g/dL (ref 6.0–8.5)

## 2015-09-12 LAB — THYROID PANEL WITH TSH
Free Thyroxine Index: 2.3 (ref 1.2–4.9)
T3 UPTAKE RATIO: 27 % (ref 24–39)
T4, Total: 8.4 ug/dL (ref 4.5–12.0)
TSH: 0.619 u[IU]/mL (ref 0.450–4.500)

## 2015-09-12 LAB — LIPID PANEL
Chol/HDL Ratio: 3.3 ratio units (ref 0.0–4.4)
Cholesterol, Total: 192 mg/dL (ref 100–199)
HDL: 58 mg/dL (ref >39)
LDL Calculated: 110 mg/dL — ABNORMAL HIGH (ref 0–99)
TRIGLYCERIDES: 119 mg/dL (ref 0–149)
VLDL Cholesterol Cal: 24 mg/dL (ref 5–40)

## 2015-10-19 DIAGNOSIS — H04123 Dry eye syndrome of bilateral lacrimal glands: Secondary | ICD-10-CM | POA: Diagnosis not present

## 2015-10-19 DIAGNOSIS — H40033 Anatomical narrow angle, bilateral: Secondary | ICD-10-CM | POA: Diagnosis not present

## 2015-10-19 LAB — HM DIABETES EYE EXAM

## 2015-11-12 ENCOUNTER — Encounter: Payer: Medicare Other | Admitting: *Deleted

## 2015-11-12 DIAGNOSIS — Z1231 Encounter for screening mammogram for malignant neoplasm of breast: Secondary | ICD-10-CM | POA: Diagnosis not present

## 2015-12-03 ENCOUNTER — Other Ambulatory Visit: Payer: Self-pay | Admitting: Nurse Practitioner

## 2015-12-03 DIAGNOSIS — F411 Generalized anxiety disorder: Secondary | ICD-10-CM

## 2015-12-14 ENCOUNTER — Other Ambulatory Visit: Payer: Medicare Other

## 2015-12-14 ENCOUNTER — Ambulatory Visit (INDEPENDENT_AMBULATORY_CARE_PROVIDER_SITE_OTHER): Payer: Medicare Other | Admitting: Nurse Practitioner

## 2015-12-14 ENCOUNTER — Encounter: Payer: Self-pay | Admitting: Nurse Practitioner

## 2015-12-14 VITALS — BP 127/60 | HR 64 | Temp 96.8°F | Ht 63.0 in | Wt 196.8 lb

## 2015-12-14 DIAGNOSIS — I10 Essential (primary) hypertension: Secondary | ICD-10-CM

## 2015-12-14 DIAGNOSIS — Z23 Encounter for immunization: Secondary | ICD-10-CM

## 2015-12-14 DIAGNOSIS — F411 Generalized anxiety disorder: Secondary | ICD-10-CM

## 2015-12-14 DIAGNOSIS — R609 Edema, unspecified: Secondary | ICD-10-CM

## 2015-12-14 DIAGNOSIS — F3341 Major depressive disorder, recurrent, in partial remission: Secondary | ICD-10-CM

## 2015-12-14 DIAGNOSIS — E038 Other specified hypothyroidism: Secondary | ICD-10-CM | POA: Diagnosis not present

## 2015-12-14 DIAGNOSIS — E119 Type 2 diabetes mellitus without complications: Secondary | ICD-10-CM

## 2015-12-14 DIAGNOSIS — Z6834 Body mass index (BMI) 34.0-34.9, adult: Secondary | ICD-10-CM

## 2015-12-14 DIAGNOSIS — E785 Hyperlipidemia, unspecified: Secondary | ICD-10-CM | POA: Diagnosis not present

## 2015-12-14 DIAGNOSIS — M8588 Other specified disorders of bone density and structure, other site: Secondary | ICD-10-CM | POA: Diagnosis not present

## 2015-12-14 LAB — BAYER DCA HB A1C WAIVED: HB A1C (BAYER DCA - WAIVED): 5.6 % (ref <7.0)

## 2015-12-14 MED ORDER — FLUTICASONE PROPIONATE 50 MCG/ACT NA SUSP: 2.0000 | Freq: Every day | NASAL | 6 refills | Status: DC

## 2015-12-14 MED ORDER — CLONAZEPAM 0.5 MG PO TABS: 0.5000 mg | ORAL_TABLET | Freq: Two times a day (BID) | ORAL | 1 refills | Status: DC

## 2015-12-14 NOTE — Patient Instructions (Signed)

## 2015-12-14 NOTE — Progress Notes (Signed)
Subjective:    Patient ID: Veronica Paul, female    DOB: 1935/09/25, 80 y.o.   MRN: [id number]  Patient here today for follow up of chronic medical problems.    C/O bil knee pain- started about 42monthago. Pain is intermittent- worse on cloudy rainy days- rates pain 8/10 when occurs. Cold weather seems to make it worse. Tylenol helps some. C/O sneezing and runny nose for about 1 month- denies fever or achiness  Outpatient Encounter Prescriptions as of 12/14/2015  Medication Sig  . amLODipine (NORVASC) 5 MG tablet Take 1 tablet (5 mg total) by mouth daily.  .Marland Kitchenaspirin EC 81 MG EC tablet Take 81 mg by mouth daily.    .Marland KitchenBLACK COHOSH PO Take by mouth.    . clonazePAM (KLONOPIN) 0.5 MG tablet Take 1 tablet (0.5 mg total) by mouth 2 (two) times daily.  . fish oil-omega-3 fatty acids 1000 MG capsule Take 2 g by mouth daily.    . furosemide (LASIX) 40 MG tablet Take 1 tablet (40 mg total) by mouth daily.  .Marland Kitchenglucose blood test strip Check BS BID and PRN . DX.E11.9  . Lancets (ONETOUCH ULTRASOFT) lancets Check BS BID and PRN. DX. E11.9  . levothyroxine (SYNTHROID, LEVOTHROID) 88 MCG tablet Take 1 tablet (88 mcg total) by mouth daily before breakfast.  . losartan (COZAAR) 100 MG tablet Take 1 tablet (100 mg total) by mouth daily.  . metFORMIN (GLUCOPHAGE) 500 MG tablet TAKE (1) TABLET TWICE A DAY WITH MEALS (BREAKFAST AND SUPPER)  . metoprolol (TOPROL-XL) 200 MG 24 hr tablet TAKE 1 & 1/2 TABLETS ONCE DAILY  . nitroGLYCERIN (NITROSTAT) 0.4 MG SL tablet Place 1 tablet (0.4 mg total) under the tongue every 5 (five) minutes as needed for chest pain.     Diabetes  She presents for her follow-up diabetic visit. She has type 2 diabetes mellitus. No MedicAlert identification noted. There are no hypoglycemic associated symptoms. Pertinent negatives for hypoglycemia include no headaches. Pertinent negatives for diabetes include no fatigue, no polydipsia, no polyphagia, no polyuria and no visual change.  Symptoms are stable. There are no diabetic complications. Risk factors for coronary artery disease include diabetes mellitus, dyslipidemia, hypertension, obesity and post-menopausal. When asked about current treatments, none were reported. Her weight is stable. She is following a generally healthy diet. When asked about meal planning, she reported none. She has not had a previous visit with a dietitian. She rarely participates in exercise. Home blood sugar record trend: she does not check blood sugars everyday. Her breakfast blood glucose range is generally 130-140 mg/dl. Her overall blood glucose range is 90-110 mg/dl. An ACE inhibitor/angiotensin II receptor blocker is being taken. She does not see a podiatrist.Eye exam is current.  Hypertension  This is a chronic problem. The current episode started more than 1 year ago. The problem is controlled. Pertinent negatives include no headaches, palpitations or shortness of breath. Risk factors for coronary artery disease include diabetes mellitus, dyslipidemia, obesity, post-menopausal state and sedentary lifestyle. Past treatments include angiotensin blockers and calcium channel blockers. The current treatment provides moderate improvement. Compliance problems include diet and exercise.  Hypertensive end-organ damage includes a thyroid problem.  Thyroid Problem  Visit type: hypothyroidism. Patient reports no constipation, diaphoresis, fatigue, palpitations or visual change. The symptoms have been stable. Her past medical history is significant for hyperlipidemia.  Hyperlipidemia  This is a chronic problem. The current episode started more than 1 year ago. The problem is uncontrolled. Recent lipid tests were  reviewed and are high. Associated symptoms include myalgias. Pertinent negatives include no shortness of breath. She is currently on no antihyperlipidemic treatment (patient stoped taking simvastatin because of myalgia- lipitor and crestor caused same  problem.). The current treatment provides moderate improvement of lipids. Compliance problems include adherence to diet and adherence to exercise.   GAD clonazepam works well when she needs it- has only used a couple of times since last visit. depression lexapro was increased to 60m daily and all that did was make her sit around day. So she weaned herself off of medication all together and does not feel like she needs anything at this time. [facility]14[facility]2015 09/11/2015 05/17/2015 04/19/2015 01/30/2015  Decreased Interest 0 0 0 (No Data) 0  Down, Depressed, Hopeless 0 0 0 - 0  PHQ - 2 Score 0 0 0 - 0  Altered sleeping - - - - -  Tired, decreased energy - - - - -  Change in appetite - - - - -  Feeling bad or failure about yourself  - - - - -  Trouble concentrating - - - - -  Moving slowly or fidgety/restless - - - - -  Suicidal thoughts - - - - -  PHQ-9 Score - - - - -   Osteopenia She is currently not taking anything for this- she tries to do weight bearing exercises but not always able.   Review of Systems  Constitutional: Negative.  Negative for diaphoresis, fatigue and fever.  HENT: Positive for congestion, rhinorrhea and sneezing. Negative for ear pain, sinus pressure, sore throat and trouble swallowing.   Respiratory: Negative.  Negative for cough and shortness of breath.   Cardiovascular: Negative for palpitations.  Gastrointestinal: Negative for constipation.  Endocrine: Negative for polydipsia, polyphagia and polyuria.  Genitourinary: Negative.   Musculoskeletal: Positive for myalgias.       Joint pain bil knees  Neurological: Negative.  Negative for headaches.  Psychiatric/Behavioral: Negative.   All other systems reviewed and are negative.      Objective:   Physical Exam  Constitutional: She is oriented to person, place, and time. She appears well-developed and well-nourished.  HENT:  Nose: Nose normal.  Mouth/Throat: Oropharynx is clear and moist.   Eyes: EOM are normal.  Neck: Trachea normal, normal range of motion and full passive range of motion without pain. Neck supple. No JVD present. Carotid bruit is not present. No thyromegaly present.  Cardiovascular: Normal rate, regular rhythm, normal heart sounds and intact distal pulses.  Exam reveals no gallop and no friction rub.   No murmur heard. Pulmonary/Chest: Effort normal and breath sounds normal.  Abdominal: Soft. Bowel sounds are normal. She exhibits no distension and no mass. There is no tenderness.  Musculoskeletal: Normal range of motion.  FROM of bil knees with crepitus palpated No patella tenderness Mild edema bil knees  Lymphadenopathy:    She has no cervical adenopathy.  Neurological: She is alert and oriented to person, place, and time. She has normal reflexes.  Positive 4/4 monofilament bil feet.  Skin: Skin is warm and dry.  No callus formation bil feet  Psychiatric: She has a normal mood and affect. Her behavior is normal. Judgment and thought content normal.   BP 127/60   Pulse 64   Temp (!) 96.8 F (36 C) (Oral)   Ht '5\' 3"'  (1.6 m)   Wt 196 lb 12.8 oz (89.3 kg)   LMP 05/11/1992   BMI 34.86 kg/m   HGBA!c-  5.6%        Assessment & Plan:   1. Hypertension, unspecified type Low sodium diet- do not add salt to diet - CMP14+EGFR  2. Other specified hypothyroidism  3. Diabetes mellitus without complication ([place]) Continue to watch carbs in diet - Bayer DCA Hb A1c Waived  4. Hyperlipidemia, unspecified hyperlipidemia type Low ft adiet - Lipid panel  5. Osteopenia of other site Weight bearing exercise if can tolerate  6. BMI 34.0-34.9,adult Discussed diet and exercise for person with BMI >25 Will recheck weight in 3-6 months  7. Recurrent major depressive disorder, in partial remission [facility]) Stress management- patient says she does not need meds at this time  8. GAD (generalized anxiety disorder) - clonazePAM (KLONOPIN) 0.5 MG tablet; Take 1  tablet (0.5 mg total) by mouth 2 (two) times daily.  Dispense: 60 tablet; Refill: 1  9. Peripheral edema Elevate legs when sitting  10. Primary osteoarthritis of both knees Only med she can take is extra strength tylenol- she gets nausea with mobic She will let me know when wants to see ortho Can try knee braces during day  11. Allergic rhinitis - flonase 2 sprays each nostril qd  Labs pending Health maintenance reviewed Diet and exercise encouraged Continue all meds Follow up  In 3 months   [place], FNP

## 2015-12-15 LAB — CMP14+EGFR
A/G RATIO: 2 (ref 1.2–2.2)
ALT: 13 IU/L (ref 0–32)
AST: 17 IU/L (ref 0–40)
Albumin: 4.3 g/dL (ref 3.5–4.8)
Alkaline Phosphatase: 60 IU/L (ref 39–117)
BUN/Creatinine Ratio: 17 (ref 12–28)
BUN: 15 mg/dL (ref 8–27)
Bilirubin Total: 0.5 mg/dL (ref 0.0–1.2)
CALCIUM: 9.2 mg/dL (ref 8.7–10.3)
CO2: 28 mmol/L (ref 18–29)
CREATININE: 0.9 mg/dL (ref 0.57–1.00)
Chloride: 99 mmol/L (ref 96–106)
GFR, EST AFRICAN AMERICAN: 71 mL/min/{1.73_m2} (ref >59)
GFR, EST NON AFRICAN AMERICAN: 61 mL/min/{1.73_m2} (ref >59)
GLOBULIN, TOTAL: 2.1 g/dL (ref 1.5–4.5)
Glucose: 97 mg/dL (ref 65–99)
POTASSIUM: 4.5 mmol/L (ref 3.5–5.2)
SODIUM: 143 mmol/L (ref 134–144)
TOTAL PROTEIN: 6.4 g/dL (ref 6.0–8.5)

## 2015-12-15 LAB — LIPID PANEL
CHOL/HDL RATIO: 4 ratio (ref 0.0–4.4)
Cholesterol, Total: 220 mg/dL — ABNORMAL HIGH (ref 100–199)
HDL: 55 mg/dL (ref >39)
LDL CALC: 139 mg/dL — AB (ref 0–99)
TRIGLYCERIDES: 132 mg/dL (ref 0–149)
VLDL Cholesterol Cal: 26 mg/dL (ref 5–40)

## 2016-01-15 ENCOUNTER — Ambulatory Visit (INDEPENDENT_AMBULATORY_CARE_PROVIDER_SITE_OTHER): Payer: Medicare Other | Admitting: Physician Assistant

## 2016-01-15 ENCOUNTER — Encounter: Payer: Self-pay | Admitting: Physician Assistant

## 2016-01-15 VITALS — BP 136/80 | HR 74 | Temp 97.6°F | Ht 63.0 in | Wt 196.8 lb

## 2016-01-15 DIAGNOSIS — R05 Cough: Secondary | ICD-10-CM | POA: Diagnosis not present

## 2016-01-15 DIAGNOSIS — J0100 Acute maxillary sinusitis, unspecified: Secondary | ICD-10-CM

## 2016-01-15 DIAGNOSIS — R059 Cough, unspecified: Secondary | ICD-10-CM

## 2016-01-15 MED ORDER — HYDROCODONE-HOMATROPINE 5-1.5 MG/5ML PO SYRP
5.0000 mL | ORAL_SOLUTION | Freq: Four times a day (QID) | ORAL | 0 refills | Status: DC | PRN
Start: 2016-01-15 — End: 2016-03-18

## 2016-01-15 MED ORDER — AMOXICILLIN 500 MG PO CAPS: 500.0000 mg | ORAL_CAPSULE | Freq: Three times a day (TID) | ORAL | 0 refills | Status: DC

## 2016-01-15 NOTE — Progress Notes (Signed)
BP 136/80   Pulse 74   Temp 97.6 F (36.4 C) (Oral)   Ht '5\' 3"'$  (1.6 m)   Wt 196 lb 12.8 oz (89.3 kg)   LMP 05/11/1992   BMI 34.86 kg/m    Subjective:    Patient ID: Veronica Paul, female    DOB: 1935/05/30, 80 y.o.   MRN: [id number]  HPI: Veronica Paul is a 80 y.o. female presenting on 01/15/2016 for Facial Pain and Sinusitis  Several days of URI, congestion, sore throat, fever. Now severe facial pressure and drainage, and worsening cough. Denies shortness of breath or chest pain. Mucus white and thick, denies blood.  Relevant past medical, surgical, family and social history reviewed and updated as indicated. Allergies and medications reviewed and updated.  Past Medical History:  Diagnosis Date  . Depression   . Diabetes mellitus   . Dyslipidemia   . HTN (hypertension)   . Hypothyroidism   . Lung cancer ([place])   . Obesity   . Osteopenia   . Osteopenia   . Vitamin D deficiency     Past Surgical History:  Procedure Laterality Date  . LUNG REMOVAL, PARTIAL  1998   Right  . THYROIDECTOMY      Review of Systems  Constitutional: Positive for chills and fever.  HENT: Positive for congestion, postnasal drip, sinus pain, sinus pressure and sore throat. Negative for voice change.   Eyes: Negative.   Respiratory: Positive for cough and wheezing.   Cardiovascular: Negative.   Gastrointestinal: Negative.   Genitourinary: Negative.       Medication List       Accurate as of 01/15/16  8:52 AM. Always use your most recent med list.          amLODipine 5 MG tablet Commonly known as:  NORVASC Take 1 tablet (5 mg total) by mouth daily.   amoxicillin 500 MG capsule Commonly known as:  AMOXIL Take 1 capsule (500 mg total) by mouth 3 (three) times daily.   aspirin EC 81 MG tablet Take 81 mg by mouth daily.   BLACK COHOSH PO Take by mouth.   clonazePAM 0.5 MG tablet Commonly known as:  KLONOPIN Take 1 tablet (0.5 mg total) by mouth 2 (two) times daily.     fish oil-omega-3 fatty acids 1000 MG capsule Take 2 g by mouth daily.   fluticasone 50 MCG/ACT nasal spray Commonly known as:  FLONASE Place 2 sprays into both nostrils daily.   furosemide 40 MG tablet Commonly known as:  LASIX Take 1 tablet (40 mg total) by mouth daily.   glucose blood test strip Check BS BID and PRN . DX.E11.9   HYDROcodone-homatropine 5-1.5 MG/5ML syrup Commonly known as:  HYCODAN Take 5 mLs by mouth every 6 (six) hours as needed for cough.   levothyroxine 88 MCG tablet Commonly known as:  SYNTHROID, LEVOTHROID Take 1 tablet (88 mcg total) by mouth daily before breakfast.   losartan 100 MG tablet Commonly known as:  COZAAR Take 1 tablet (100 mg total) by mouth daily.   metFORMIN 500 MG tablet Commonly known as:  GLUCOPHAGE TAKE (1) TABLET TWICE A DAY WITH MEALS (BREAKFAST AND SUPPER)   metoprolol 200 MG 24 hr tablet Commonly known as:  TOPROL-XL TAKE 1 & 1/2 TABLETS ONCE DAILY   nitroGLYCERIN 0.4 MG SL tablet Commonly known as:  NITROSTAT Place 1 tablet (0.4 mg total) under the tongue every 5 (five) minutes as needed for chest pain.   onetouch ultrasoft  lancets Check BS BID and PRN. DX. E11.9          Objective:    BP 136/80   Pulse 74   Temp 97.6 F (36.4 C) (Oral)   Ht '5\' 3"'$  (1.6 m)   Wt 196 lb 12.8 oz (89.3 kg)   LMP 05/11/1992   BMI 34.86 kg/m   Allergies  Allergen Reactions  . Ace Inhibitors Cough  . Feldene [Piroxicam]     REACTION: RASH TO HANDS  . Meloxicam Nausea And Vomiting  . Prevnar [Pneumococcal 13-Val Conj Vacc]   . Statins Other (See Comments)    myalgia  . Sulfa Antibiotics     Rash   . Zithromax [Azithromycin Dihydrate] Itching    Physical Exam  Constitutional: She is oriented to person, place, and time. She appears well-developed and well-nourished.  HENT:  Head: Normocephalic and atraumatic.  Right Ear: Tympanic membrane and external ear normal. No middle ear effusion.  Left Ear: Tympanic membrane  and external ear normal.  No middle ear effusion.  Nose: Mucosal edema and rhinorrhea present. Right sinus exhibits no maxillary sinus tenderness. Left sinus exhibits no maxillary sinus tenderness.  Mouth/Throat: Uvula is midline. Posterior oropharyngeal erythema present.  Eyes: Conjunctivae and EOM are normal. Pupils are equal, round, and reactive to light. Right eye exhibits no discharge. Left eye exhibits no discharge.  Neck: Normal range of motion.  Cardiovascular: Normal rate, regular rhythm and normal heart sounds.   Pulmonary/Chest: Effort normal and breath sounds normal. No respiratory distress. She has no wheezes.  Abdominal: Soft.  Lymphadenopathy:    She has no cervical adenopathy.  Neurological: She is alert and oriented to person, place, and time.  Skin: Skin is warm and dry.  Psychiatric: She has a normal mood and affect.  Nursing note and vitals reviewed.       Assessment & Plan:   1. Acute maxillary sinusitis, recurrence not specified - amoxicillin (AMOXIL) 500 MG capsule; Take 1 capsule (500 mg total) by mouth 3 (three) times daily.  Dispense: 30 capsule; Refill: 0  2. Cough - HYDROcodone-homatropine (HYCODAN) 5-1.5 MG/5ML syrup; Take 5 mLs by mouth every 6 (six) hours as needed for cough.  Dispense: 240 mL; Refill: 0   Continue all other maintenance medications as listed above.  Follow up plan: Return if symptoms worsen or fail to improve.   Educational handout given for sinusitis  Terald Sleeper PA-C [facility] [address]  [place], [place] [address] [phone]   01/15/2016, 8:52 AM

## 2016-01-15 NOTE — Patient Instructions (Signed)

## 2016-03-18 ENCOUNTER — Encounter: Payer: Self-pay | Admitting: Nurse Practitioner

## 2016-03-18 ENCOUNTER — Ambulatory Visit (INDEPENDENT_AMBULATORY_CARE_PROVIDER_SITE_OTHER): Payer: Medicare HMO | Admitting: Nurse Practitioner

## 2016-03-18 ENCOUNTER — Other Ambulatory Visit: Payer: Self-pay

## 2016-03-18 VITALS — BP 125/69 | HR 77 | Ht 63.0 in | Wt 194.0 lb

## 2016-03-18 DIAGNOSIS — F411 Generalized anxiety disorder: Secondary | ICD-10-CM

## 2016-03-18 DIAGNOSIS — F3341 Major depressive disorder, recurrent, in partial remission: Secondary | ICD-10-CM

## 2016-03-18 DIAGNOSIS — R609 Edema, unspecified: Secondary | ICD-10-CM

## 2016-03-18 DIAGNOSIS — I10 Essential (primary) hypertension: Secondary | ICD-10-CM

## 2016-03-18 DIAGNOSIS — E119 Type 2 diabetes mellitus without complications: Secondary | ICD-10-CM

## 2016-03-18 DIAGNOSIS — J069 Acute upper respiratory infection, unspecified: Secondary | ICD-10-CM

## 2016-03-18 DIAGNOSIS — R69 Illness, unspecified: Secondary | ICD-10-CM | POA: Diagnosis not present

## 2016-03-18 DIAGNOSIS — Z6834 Body mass index (BMI) 34.0-34.9, adult: Secondary | ICD-10-CM | POA: Diagnosis not present

## 2016-03-18 DIAGNOSIS — E038 Other specified hypothyroidism: Secondary | ICD-10-CM | POA: Diagnosis not present

## 2016-03-18 DIAGNOSIS — E785 Hyperlipidemia, unspecified: Secondary | ICD-10-CM | POA: Diagnosis not present

## 2016-03-18 LAB — CMP14+EGFR
ALBUMIN: 4.3 g/dL (ref 3.5–4.8)
ALT: 12 IU/L (ref 0–32)
AST: 12 IU/L (ref 0–40)
Albumin/Globulin Ratio: 1.8 (ref 1.2–2.2)
Alkaline Phosphatase: 61 IU/L (ref 39–117)
BILIRUBIN TOTAL: 0.4 mg/dL (ref 0.0–1.2)
BUN / CREAT RATIO: 9 — AB (ref 12–28)
BUN: 9 mg/dL (ref 8–27)
CHLORIDE: 98 mmol/L (ref 96–106)
CO2: 26 mmol/L (ref 18–29)
CREATININE: 0.97 mg/dL (ref 0.57–1.00)
Calcium: 8.8 mg/dL (ref 8.7–10.3)
GFR calc Af Amer: 65 mL/min/{1.73_m2} (ref >59)
GFR calc non Af Amer: 56 mL/min/{1.73_m2} — ABNORMAL LOW (ref >59)
Globulin, Total: 2.4 g/dL (ref 1.5–4.5)
Glucose: 111 mg/dL — ABNORMAL HIGH (ref 65–99)
Potassium: 4.5 mmol/L (ref 3.5–5.2)
Sodium: 142 mmol/L (ref 134–144)
Total Protein: 6.7 g/dL (ref 6.0–8.5)

## 2016-03-18 LAB — LIPID PANEL
Chol/HDL Ratio: 4.2 ratio units (ref 0.0–4.4)
Cholesterol, Total: 219 mg/dL — ABNORMAL HIGH (ref 100–199)
HDL: 52 mg/dL (ref >39)
LDL CALC: 145 mg/dL — AB (ref 0–99)
TRIGLYCERIDES: 110 mg/dL (ref 0–149)
VLDL CHOLESTEROL CAL: 22 mg/dL (ref 5–40)

## 2016-03-18 LAB — BAYER DCA HB A1C WAIVED: HB A1C (BAYER DCA - WAIVED): 6.1 % (ref <7.0)

## 2016-03-18 MED ORDER — CLONAZEPAM 0.5 MG PO TABS: 0.5000 mg | ORAL_TABLET | Freq: Two times a day (BID) | ORAL | 1 refills | Status: DC

## 2016-03-18 MED ORDER — FUROSEMIDE 40 MG PO TABS: 40.0000 mg | ORAL_TABLET | Freq: Every day | ORAL | 1 refills | Status: DC

## 2016-03-18 MED ORDER — METFORMIN HCL 500 MG PO TABS: 500.0000 mg | ORAL_TABLET | Freq: Two times a day (BID) | ORAL | 5 refills | Status: DC

## 2016-03-18 MED ORDER — METFORMIN HCL 1000 MG PO TABS
1000.0000 mg | ORAL_TABLET | Freq: Two times a day (BID) | ORAL | 1 refills | Status: DC
Start: 2016-03-18 — End: 2016-03-18

## 2016-03-18 MED ORDER — LEVOTHYROXINE SODIUM 88 MCG PO TABS: ORAL_TABLET | ORAL | 1 refills | Status: DC

## 2016-03-18 MED ORDER — AMOXICILLIN 500 MG PO CAPS: 500.0000 mg | ORAL_CAPSULE | Freq: Three times a day (TID) | ORAL | 0 refills | Status: DC

## 2016-03-18 MED ORDER — LOSARTAN POTASSIUM 100 MG PO TABS: 100.0000 mg | ORAL_TABLET | Freq: Every day | ORAL | 1 refills | Status: DC

## 2016-03-18 MED ORDER — HYDROCODONE-HOMATROPINE 5-1.5 MG/5ML PO SYRP: 5.0000 mL | ORAL_SOLUTION | Freq: Four times a day (QID) | ORAL | 0 refills | Status: DC | PRN

## 2016-03-18 MED ORDER — METOPROLOL SUCCINATE ER 200 MG PO TB24: ORAL_TABLET | ORAL | 1 refills | Status: DC

## 2016-03-18 MED ORDER — AMLODIPINE BESYLATE 5 MG PO TABS: 5.0000 mg | ORAL_TABLET | Freq: Every day | ORAL | 5 refills | Status: DC

## 2016-03-18 MED ORDER — METFORMIN HCL 500 MG PO TABS: ORAL_TABLET | ORAL | 1 refills | Status: DC

## 2016-03-18 NOTE — Patient Instructions (Signed)

## 2016-03-18 NOTE — Progress Notes (Signed)
Subjective:    Patient ID: [name], female    DOB: 08/30/1935, 81 y.o.   MRN: [id number]  Patient here today for follow up of chronic medical problems.    *She is c/o cough and congestion that started 2-3 days ago- thinks she has had a low grade fever.  Outpatient Encounter Prescriptions as of 12/14/2015  Medication Sig  . amLODipine (NORVASC) 5 MG tablet Take 1 tablet (5 mg total) by mouth daily.  Marland Kitchen aspirin EC 81 MG EC tablet Take 81 mg by mouth daily.    Marland Kitchen BLACK COHOSH PO Take by mouth.    . clonazePAM (KLONOPIN) 0.5 MG tablet Take 1 tablet (0.5 mg total) by mouth 2 (two) times daily.  . fish oil-omega-3 fatty acids 1000 MG capsule Take 2 g by mouth daily.    . furosemide (LASIX) 40 MG tablet Take 1 tablet (40 mg total) by mouth daily.  Marland Kitchen glucose blood test strip Check BS BID and PRN . DX.E11.9  . Lancets (ONETOUCH ULTRASOFT) lancets Check BS BID and PRN. DX. E11.9  . levothyroxine (SYNTHROID, LEVOTHROID) 88 MCG tablet Take 1 tablet (88 mcg total) by mouth daily before breakfast.  . losartan (COZAAR) 100 MG tablet Take 1 tablet (100 mg total) by mouth daily.  . metFORMIN (GLUCOPHAGE) 500 MG tablet TAKE (1) TABLET TWICE A DAY WITH MEALS (BREAKFAST AND SUPPER)  . metoprolol (TOPROL-XL) 200 MG 24 hr tablet TAKE 1 & 1/2 TABLETS ONCE DAILY  . nitroGLYCERIN (NITROSTAT) 0.4 MG SL tablet Place 1 tablet (0.4 mg total) under the tongue every 5 (five) minutes as needed for chest pain.     Diabetes  She presents for her follow-up diabetic visit. She has type 2 diabetes mellitus. No MedicAlert identification noted. There are no hypoglycemic associated symptoms. Pertinent negatives for diabetes include no fatigue, no polydipsia, no polyphagia, no polyuria and no visual change. Symptoms are stable. There are no diabetic complications. Risk factors for coronary artery disease include diabetes mellitus, dyslipidemia, hypertension, obesity and post-menopausal. When asked about current treatments,  none were reported. Her weight is stable. She is following a generally healthy diet. When asked about meal planning, she reported none (not watching diet at all.). She has not had a previous visit with a dietitian. She rarely participates in exercise. Home blood sugar record trend: Has not been checking blood sugars daily. Her breakfast blood glucose range is generally 130-140 mg/dl. Her overall blood glucose range is 90-110 mg/dl. An ACE inhibitor/angiotensin II receptor blocker is being taken. She does not see a podiatrist.Eye exam is current.  Hypertension  This is a chronic problem. The current episode started more than 1 year ago. The problem is controlled. Pertinent negatives include no palpitations or shortness of breath. Risk factors for coronary artery disease include diabetes mellitus, dyslipidemia, obesity, post-menopausal state and sedentary lifestyle. Past treatments include angiotensin blockers and calcium channel blockers. The current treatment provides moderate improvement. Compliance problems include diet and exercise.  Identifiable causes of hypertension include a thyroid problem.  Thyroid Problem  Visit type: hypothyroidism. Patient reports no constipation, diaphoresis, fatigue, palpitations or visual change. The symptoms have been stable. Her past medical history is significant for hyperlipidemia.  Hyperlipidemia  This is a chronic problem. The current episode started more than 1 year ago. The problem is uncontrolled. Recent lipid tests were reviewed and are high. Pertinent negatives include no shortness of breath. She is currently on no antihyperlipidemic treatment (patient stoped taking simvastatin because of myalgia- lipitor and  crestor caused same problem.). The current treatment provides moderate improvement of lipids. Compliance problems include adherence to diet and adherence to exercise.   GAD clonazepam works well when she needs it- has only used a couple of times since last  visit. depression lexapro was increased to '20mg'$  daily and all that did was make her sit around day. So she weaned herself off of medication all together and does not feel like she needs anything at this time. Depression screen [facility] 2/9 03/18/2016 01/15/2016 12/14/2015 09/11/2015 05/17/2015  Decreased Interest 0 1 0 0 0  Down, Depressed, Hopeless 0 0 0 0 0  PHQ - 2 Score 0 1 0 0 0  Altered sleeping - - - - -  Tired, decreased energy - - - - -  Change in appetite - - - - -  Feeling bad or failure about yourself  - - - - -  Trouble concentrating - - - - -  Moving slowly or fidgety/restless - - - - -  Suicidal thoughts - - - - -  PHQ-9 Score - - - - -   Osteopenia She is currently not taking anything for this- she tries to do weight bearing exercises but not always able.   Review of Systems  Constitutional: Positive for fever (?). Negative for diaphoresis and fatigue.  HENT: Positive for congestion and sneezing. Negative for sinus pressure.   Respiratory: Positive for cough. Negative for shortness of breath.   Cardiovascular: Negative for palpitations.  Gastrointestinal: Negative for constipation.  Endocrine: Negative for polydipsia, polyphagia and polyuria.  Genitourinary: Negative.   Neurological: Negative.   Psychiatric/Behavioral: Negative.   All other systems reviewed and are negative.      Objective:   Physical Exam  Constitutional: She is oriented to person, place, and time. She appears well-developed and well-nourished.  HENT:  Right Ear: Hearing, tympanic membrane, external ear and ear canal normal.  Left Ear: Hearing, tympanic membrane, external ear and ear canal normal.  Nose: Mucosal edema and rhinorrhea present. Right sinus exhibits no maxillary sinus tenderness and no frontal sinus tenderness. Left sinus exhibits no maxillary sinus tenderness and no frontal sinus tenderness.  Mouth/Throat: Uvula is midline, oropharynx is clear and moist and mucous membranes are normal.   Eyes: EOM are normal.  Neck: Trachea normal, normal range of motion and full passive range of motion without pain. Neck supple. No JVD present. Carotid bruit is not present. No thyromegaly present.  Cardiovascular: Normal rate, regular rhythm, normal heart sounds and intact distal pulses.  Exam reveals no gallop and no friction rub.   No murmur heard. Pulmonary/Chest: Effort normal. She has wheezes (faint exp wheezes bil lower lobes).  Dry cough  Abdominal: Soft. Bowel sounds are normal. She exhibits no distension and no mass. There is no tenderness.  Musculoskeletal: Normal range of motion.  Lymphadenopathy:    She has no cervical adenopathy.  Neurological: She is alert and oriented to person, place, and time. She has normal reflexes.  Positive 4/4 monofilament bil feet.  Skin: Skin is warm and dry.  No callus formation bil feet  Psychiatric: She has a normal mood and affect. Her behavior is normal. Judgment and thought content normal.   BP 125/69   Pulse 77   Ht '5\' 3"'$  (1.6 m)   Wt 194 lb (88 kg)   LMP 05/11/1992   BMI 34.37 kg/m   HGBA!c- 6.1%        Assessment & Plan:    2. Essential hypertension  Low sodium diet - amLODipine (NORVASC) 5 MG tablet; Take 1 tablet (5 mg total) by mouth daily.  Dispense: 30 tablet; Refill: 5 - losartan (COZAAR) 100 MG tablet; Take 1 tablet (100 mg total) by mouth daily.  Dispense: 90 tablet; Refill: 1 - metoprolol (TOPROL-XL) 200 MG 24 hr tablet; TAKE 1 & 1/2 TABLETS ONCE DAILY  Dispense: 135 tablet; Refill: 1  3. Hyperlipidemia, unspecified hyperlipidemia type Low fat diet - Lipid panel  4. Diabetes mellitus without complication ([place]) Continue  To watch carbs in diet Keep diary of blood sugars - Bayer DCA Hb A1c Waived - metFORMIN (GLUCOPHAGE) 500  MG tablet; TAKE (1) TABLET TWICE A DAY WITH MEALS (BREAKFAST AND SUPPER)  Dispense: 180 tablet; Refill: 1  5. GAD (generalized anxiety disorder) Stress management - clonazePAM  (KLONOPIN) 0.5 MG tablet; Take 1 tablet (0.5 mg total) by mouth 2 (two) times daily.  Dispense: 60 tablet; Refill: 1  6. Peripheral edema Elevate legs when sitting Compression knee hi socks encouraged - furosemide (LASIX) 40 MG tablet; Take 1 tablet (40 mg total) by mouth daily.  Dispense: 90 tablet; Refill: 1  7. Other specified hypothyroidism  8. BMI 34.0-34.9,adult Discussed diet and exercise for person with BMI >25 Will recheck weight in 3-6 months  9. Recurrent major depressive disorder, in partial remission ([place])  10. Upper respiratory infection with cough and congestion 1. Take meds as prescribed 2. Use a cool mist humidifier especially during the winter months and when heat has been humid. 3. Use saline nose sprays frequently 4. Saline irrigations of the nose can be very helpful if done frequently.  * 4X daily for 1 week*  * Use of a nettie pot can be helpful with this. Follow directions with this* 5. Drink plenty of fluids 6. Keep thermostat turn down low 7.For any cough or congestion  Use plain Mucinex- regular strength or max strength is fine   * Children- consult with Pharmacist for dosing 8. For fever or aces or pains- take tylenol or ibuprofen appropriate for age and weight.  * for fevers greater than 101 orally you may alternate ibuprofen and tylenol every  3 hours.   - amoxicillin (AMOXIL) 500 MG capsule; Take 1 capsule (500 mg total) by mouth 3 (three) times daily.  Dispense: 30 capsule; Refill: 0 - HYDROcodone-homatropine (HYCODAN) 5-1.5 MG/5ML syrup; Take 5 mLs by mouth every 6 (six) hours as needed for cough.  Dispense: 240 mL; Refill: 0    Labs pending Health maintenance reviewed Diet and exercise encouraged Continue all meds Follow up  In 3 months   [place], FNP

## 2016-03-20 ENCOUNTER — Ambulatory Visit: Payer: Medicare Other | Admitting: Nurse Practitioner

## 2016-03-26 ENCOUNTER — Telehealth: Payer: Self-pay | Admitting: Family

## 2016-03-26 NOTE — Telephone Encounter (Signed)
Pt saw MMM on 03/18/2016 Pt finished antibiotic Denies fever or SOB Pt has continued cough occasionally prod, clear in color Pt instructed to drink plenty of fluids, use cough drops or hard candy, may also try honey and instructed to use vaporizer RTC if sxs persist or worsen Pt verbalizes understanding

## 2016-03-28 ENCOUNTER — Encounter: Payer: Self-pay | Admitting: Family Medicine

## 2016-03-28 ENCOUNTER — Ambulatory Visit (INDEPENDENT_AMBULATORY_CARE_PROVIDER_SITE_OTHER): Payer: Medicare HMO | Admitting: Family Medicine

## 2016-03-28 VITALS — BP 129/66 | HR 93 | Temp 97.8°F | Ht 63.0 in | Wt 186.0 lb

## 2016-03-28 DIAGNOSIS — J40 Bronchitis, not specified as acute or chronic: Secondary | ICD-10-CM

## 2016-03-28 DIAGNOSIS — J329 Chronic sinusitis, unspecified: Secondary | ICD-10-CM | POA: Diagnosis not present

## 2016-03-28 MED ORDER — LEVOFLOXACIN 500 MG PO TABS: 500.0000 mg | ORAL_TABLET | Freq: Every day | ORAL | 0 refills | Status: DC

## 2016-03-28 MED ORDER — GUAIFENESIN-CODEINE 100-10 MG/5ML PO SYRP: 5.0000 mL | ORAL_SOLUTION | ORAL | 0 refills | Status: DC | PRN

## 2016-03-28 NOTE — Progress Notes (Signed)
Subjective:  Patient ID: Veronica Paul, female    DOB: 22-Aug-1935  Age: 81 y.o. MRN: [id number]  CC: Cough (pt was seen on 1/30 and given antibioitc for URI but she states she isn't getting any better)   HPI Veronica Paul presents for  Patient presents with productive cough with yellow sputum, stuffy nose. Congestion with moderate intensity. Patient denies chills and subjective fever. Body aches as well. Has sapped the energy. Onset 12 days ago. She took 10 days of amoxicillin. Her last dose was this morning. She reports that her symptoms have not improved. She is not short of breath. However the cough was dry when symptoms started it has recently become productive in spite of the antibiotic. Cough syrup didn't help either.    History Veronica Paul has a past medical history of Depression; Diabetes mellitus; Dyslipidemia; HTN (hypertension); Hypothyroidism; Lung cancer ([place]); Obesity; Osteopenia; Osteopenia; and Vitamin D deficiency.   She has a past surgical history that includes Thyroidectomy and Lung removal, partial (1998).   Her family history includes Coronary artery disease (age of onset: 30) in her brother; Heart disease in her brother and father; Hypertension in her brother, father, mother, and sister.She reports that she has never smoked. She has never used smokeless tobacco. She reports that she does not drink alcohol or use drugs.    ROS Review of Systems  Constitutional: Positive for appetite change, chills and fever.  HENT: Positive for congestion, rhinorrhea, sinus pressure and sore throat. Negative for postnasal drip.   Respiratory: Positive for cough. Negative for chest tightness and shortness of breath.   Cardiovascular: Negative for chest pain.  Musculoskeletal: Positive for myalgias.  Skin: Negative for rash.    Objective:  BP 129/66   Pulse 93   Temp 97.8 F (36.6 C) (Oral)   Ht '5\' 3"'$  (1.6 m)   Wt 186 lb (84.4 kg)   LMP 05/11/1992   BMI 32.95 kg/m   BP  Readings from Last 3 Encounters:  03/28/16 129/66  03/18/16 125/69  01/15/16 136/80    Wt Readings from Last 3 Encounters:  03/28/16 186 lb (84.4 kg)  03/18/16 194 lb (88 kg)  01/15/16 196 lb 12.8 oz (89.3 kg)     Physical Exam  Constitutional: She appears well-developed and well-nourished.  HENT:  Head: Normocephalic and atraumatic.  Right Ear: Tympanic membrane and external ear normal. No decreased hearing is noted.  Left Ear: Tympanic membrane and external ear normal. No decreased hearing is noted.  Nose: Mucosal edema present. Right sinus exhibits no frontal sinus tenderness. Left sinus exhibits no frontal sinus tenderness.  Mouth/Throat: No oropharyngeal exudate or posterior oropharyngeal erythema.  Neck: No Brudzinski's sign noted.  Pulmonary/Chest: Breath sounds normal. No respiratory distress.  Lymphadenopathy:       Head (right side): No preauricular adenopathy present.       Head (left side): No preauricular adenopathy present.       Right cervical: No superficial cervical adenopathy present.      Left cervical: No superficial cervical adenopathy present.    No results found.  Assessment & Plan:   Veronica Paul was seen today for cough.  Diagnoses and all orders for this visit:  Sinobronchitis  Other orders -     levofloxacin (LEVAQUIN) 500 MG tablet; Take 1 tablet (500 mg total) by mouth daily. -     guaiFENesin-codeine (CHERATUSSIN AC) 100-10 MG/5ML syrup; Take 5 mLs by mouth every 4 (four) hours as needed for cough.  I am having Veronica Paul start on levofloxacin and guaiFENesin-codeine. I am also having her maintain her aspirin EC, fish oil-omega-3 fatty acids, BLACK COHOSH PO, glucose blood, onetouch ultrasoft, nitroGLYCERIN, fluticasone, amLODipine, losartan, metoprolol, clonazePAM, levothyroxine, furosemide, HYDROcodone-homatropine, amoxicillin, and metFORMIN.  Allergies as of 03/28/2016      Reactions   Ace Inhibitors Cough   Feldene [piroxicam]     REACTION: RASH TO HANDS   Meloxicam Nausea And Vomiting   Prevnar [pneumococcal 13-val Conj Vacc]    Statins Other (See Comments)   myalgia   Sulfa Antibiotics    Rash   Zithromax [azithromycin Dihydrate] Itching      Medication List       Accurate as of 03/28/16 12:03 PM. Always use your most recent med list.          amLODipine 5 MG tablet Commonly known as:  NORVASC Take 1 tablet (5 mg total) by mouth daily.   amoxicillin 500 MG capsule Commonly known as:  AMOXIL Take 1 capsule (500 mg total) by mouth 3 (three) times daily.   aspirin EC 81 MG tablet Take 81 mg by mouth daily.   BLACK COHOSH PO Take by mouth.   clonazePAM 0.5 MG tablet Commonly known as:  KLONOPIN Take 1 tablet (0.5 mg total) by mouth 2 (two) times daily.   fish oil-omega-3 fatty acids 1000 MG capsule Take 2 g by mouth daily.   fluticasone 50 MCG/ACT nasal spray Commonly known as:  FLONASE Place 2 sprays into both nostrils daily.   furosemide 40 MG tablet Commonly known as:  LASIX Take 1 tablet (40 mg total) by mouth daily.   glucose blood test strip Check BS BID and PRN . DX.E11.9   guaiFENesin-codeine 100-10 MG/5ML syrup Commonly known as:  CHERATUSSIN AC Take 5 mLs by mouth every 4 (four) hours as needed for cough.   HYDROcodone-homatropine 5-1.5 MG/5ML syrup Commonly known as:  HYCODAN Take 5 mLs by mouth every 6 (six) hours as needed for cough.   levofloxacin 500 MG tablet Commonly known as:  LEVAQUIN Take 1 tablet (500 mg total) by mouth daily.   levothyroxine 88 MCG tablet Commonly known as:  SYNTHROID, LEVOTHROID Take 1 tablet (88 mcg total) by mouth daily before breakfast.   losartan 100 MG tablet Commonly known as:  COZAAR Take 1 tablet (100 mg total) by mouth daily.   metFORMIN 500 MG tablet Commonly known as:  GLUCOPHAGE Take 1 tablet (500 mg total) by mouth 2 (two) times daily with a meal.   metoprolol 200 MG 24 hr tablet Commonly known as:  TOPROL-XL TAKE 1 &  1/2 TABLETS ONCE DAILY   nitroGLYCERIN 0.4 MG SL tablet Commonly known as:  NITROSTAT Place 1 tablet (0.4 mg total) under the tongue every 5 (five) minutes as needed for chest pain.   onetouch ultrasoft lancets Check BS BID and PRN. DX. E11.9        Follow-up: Return if symptoms worsen or fail to improve.  Claretta Fraise, M.D.

## 2016-04-10 DIAGNOSIS — R69 Illness, unspecified: Secondary | ICD-10-CM | POA: Diagnosis not present

## 2016-04-25 DIAGNOSIS — H26491 Other secondary cataract, right eye: Secondary | ICD-10-CM | POA: Diagnosis not present

## 2016-04-25 DIAGNOSIS — H35371 Puckering of macula, right eye: Secondary | ICD-10-CM | POA: Diagnosis not present

## 2016-04-25 DIAGNOSIS — H26492 Other secondary cataract, left eye: Secondary | ICD-10-CM | POA: Diagnosis not present

## 2016-06-19 ENCOUNTER — Encounter: Payer: Self-pay | Admitting: Nurse Practitioner

## 2016-06-19 ENCOUNTER — Ambulatory Visit (INDEPENDENT_AMBULATORY_CARE_PROVIDER_SITE_OTHER): Payer: Medicare HMO | Admitting: Nurse Practitioner

## 2016-06-19 VITALS — BP 130/77 | HR 62 | Temp 96.9°F | Ht 63.0 in | Wt 187.0 lb

## 2016-06-19 DIAGNOSIS — E119 Type 2 diabetes mellitus without complications: Secondary | ICD-10-CM

## 2016-06-19 DIAGNOSIS — F411 Generalized anxiety disorder: Secondary | ICD-10-CM | POA: Diagnosis not present

## 2016-06-19 DIAGNOSIS — Z6834 Body mass index (BMI) 34.0-34.9, adult: Secondary | ICD-10-CM

## 2016-06-19 DIAGNOSIS — R69 Illness, unspecified: Secondary | ICD-10-CM | POA: Diagnosis not present

## 2016-06-19 DIAGNOSIS — E038 Other specified hypothyroidism: Secondary | ICD-10-CM

## 2016-06-19 DIAGNOSIS — E785 Hyperlipidemia, unspecified: Secondary | ICD-10-CM | POA: Diagnosis not present

## 2016-06-19 DIAGNOSIS — I10 Essential (primary) hypertension: Secondary | ICD-10-CM | POA: Diagnosis not present

## 2016-06-19 DIAGNOSIS — F3341 Major depressive disorder, recurrent, in partial remission: Secondary | ICD-10-CM

## 2016-06-19 DIAGNOSIS — R609 Edema, unspecified: Secondary | ICD-10-CM

## 2016-06-19 DIAGNOSIS — M8588 Other specified disorders of bone density and structure, other site: Secondary | ICD-10-CM

## 2016-06-19 LAB — BAYER DCA HB A1C WAIVED: HB A1C (BAYER DCA - WAIVED): 5.5 % (ref <7.0)

## 2016-06-19 MED ORDER — CLONAZEPAM 0.5 MG PO TABS: 0.5000 mg | ORAL_TABLET | Freq: Two times a day (BID) | ORAL | 2 refills | Status: DC

## 2016-06-19 NOTE — Patient Instructions (Signed)
Hypothyroidism Hypothyroidism is a disorder of the thyroid. The thyroid is a large gland that is located in the lower front of the neck. The thyroid releases hormones that control how the body works. With hypothyroidism, the thyroid does not make enough of these hormones. What are the causes? Causes of hypothyroidism may include:  Viral infections.  Pregnancy.  Your own defense system (immune system) attacking your thyroid.  Certain medicines.  Birth defects.  Past radiation treatments to your head or neck.  Past treatment with radioactive iodine.  Past surgical removal of part or all of your thyroid.  Problems with the gland that is located in the center of your brain (pituitary).  What are the signs or symptoms? Signs and symptoms of hypothyroidism may include:  Feeling as though you have no energy (lethargy).  Inability to tolerate cold.  Weight gain that is not explained by a change in diet or exercise habits.  Dry skin.  Coarse hair.  Menstrual irregularity.  Slowing of thought processes.  Constipation.  Sadness or depression.  How is this diagnosed? Your health care provider may diagnose hypothyroidism with blood tests and ultrasound tests. How is this treated? Hypothyroidism is treated with medicine that replaces the hormones that your body does not make. After you begin treatment, it may take several weeks for symptoms to go away. Follow these instructions at home:  Take medicines only as directed by your health care provider.  If you start taking any new medicines, tell your health care provider.  Keep all follow-up visits as directed by your health care provider. This is important. As your condition improves, your dosage needs may change. You will need to have blood tests regularly so that your health care provider can watch your condition. Contact a health care provider if:  Your symptoms do not get better with treatment.  You are taking thyroid  replacement medicine and: ? You sweat excessively. ? You have tremors. ? You feel anxious. ? You lose weight rapidly. ? You cannot tolerate heat. ? You have emotional swings. ? You have diarrhea. ? You feel weak. Get help right away if:  You develop chest pain.  You develop an irregular heartbeat.  You develop a rapid heartbeat. This information is not intended to replace advice given to you by your health care provider. Make sure you discuss any questions you have with your health care provider. Document Released: 02/03/2005 Document Revised: 07/12/2015 Document Reviewed: 06/21/2013 Elsevier Interactive Patient Education  2017 [employer] Inc.  

## 2016-06-19 NOTE — Progress Notes (Signed)
Subjective:    Patient ID: Veronica Paul, female    DOB: 06-Jun-1935, 81 y.o.   MRN: [id number]  HPI  CHASTELYN ATHENS is here today for follow up of chronic medical problem.  Outpatient Encounter Prescriptions as of 06/19/2016  Medication Sig  . amLODipine (NORVASC) 5 MG tablet Take 1 tablet (5 mg total) by mouth daily.  Marland Kitchen aspirin EC 81 MG EC tablet Take 81 mg by mouth daily.    Marland Kitchen BLACK COHOSH PO Take by mouth.    . clonazePAM (KLONOPIN) 0.5 MG tablet Take 1 tablet (0.5 mg total) by mouth 2 (two) times daily.  . fish oil-omega-3 fatty acids 1000 MG capsule Take 2 g by mouth daily.    . fluticasone (FLONASE) 50 MCG/ACT nasal spray Place 2 sprays into both nostrils daily.  . furosemide (LASIX) 40 MG tablet Take 1 tablet (40 mg total) by mouth daily.  Marland Kitchen glucose blood test strip Check BS BID and PRN . DX.E11.9  . Lancets (ONETOUCH ULTRASOFT) lancets Check BS BID and PRN. DX. E11.9  . levothyroxine (SYNTHROID, LEVOTHROID) 88 MCG tablet Take 1 tablet (88 mcg total) by mouth daily before breakfast.  . losartan (COZAAR) 100 MG tablet Take 1 tablet (100 mg total) by mouth daily.  . metFORMIN (GLUCOPHAGE) 500 MG tablet Take 1 tablet (500 mg total) by mouth 2 (two) times daily with a meal.  . metoprolol (TOPROL-XL) 200 MG 24 hr tablet TAKE 1 & 1/2 TABLETS ONCE DAILY  . nitroGLYCERIN (NITROSTAT) 0.4 MG SL tablet Place 1 tablet (0.4 mg total) under the tongue every 5 (five) minutes as needed for chest pain.     1. Hypertension, unspecified type  No c/o chest pain,SOB or HA- does not check blood pressure at home  2. Hyperlipidemia, unspecified hyperlipidemia type   patient does not watch fats in diet  3. Diabetes mellitus without complication ([place])   last hgba1c 6.1%- does not check blood sugars very often at home  4. Other specified hypothyroidism  No problems  5. Osteopenia of other site  No weight bearing exercises  6. BMI 34.0-34.9,adult  No recent weight gain or weight loss  7. Recurrent  major depressive disorder, in partial remission [facility])   is only on klonopin Depression screen [facility] 2/9 06/19/2016 03/28/2016 03/18/2016 01/15/2016 12/14/2015  Decreased Interest 0 0 0 1 0  Down, Depressed, Hopeless 0 0 0 0 0  PHQ - 2 Score 0 0 0 1 0  Altered sleeping - - - - -  Tired, decreased energy - - - - -  Change in appetite - - - - -  Feeling bad or failure about yourself  - - - - -  Trouble concentrating - - - - -  Moving slowly or fidgety/restless - - - - -  Suicidal thoughts - - - - -  PHQ-9 Score - - - - -     8. GAD (generalized anxiety disorder)  Again on klonopin  9. Peripheral edema   no problems    New complaints: None today     Review of Systems  Constitutional: Negative.   Respiratory: Negative.   Cardiovascular: Negative.   Gastrointestinal: Negative.   All other systems reviewed and are negative.      Objective:   Physical Exam  Constitutional: She is oriented to person, place, and time. She appears well-developed and well-nourished.  HENT:  Head: Normocephalic.  Mouth/Throat: Oropharynx is clear and moist.  Eyes: Pupils are equal, round, and  reactive to light.  Neck: Normal range of motion. Neck supple.  Cardiovascular: Normal rate, regular rhythm and normal heart sounds.   Pulmonary/Chest: Effort normal and breath sounds normal.  Abdominal: Soft. Bowel sounds are normal.  Musculoskeletal: Normal range of motion.  Neurological: She is alert and oriented to person, place, and time.  Skin: Skin is warm and dry.  Psychiatric: She has a normal mood and affect. Her behavior is normal. Judgment and thought content normal.     BP 130/77   Pulse 62   Temp (!) 96.9 F (36.1 C) (Oral)   Ht _0  (1.6 m)   Wt 187 lb (84.8 kg)   LMP 05/11/1992   BMI 33.13 kg/m       Assessment & Plan:  1. Hypertension, unspecified type Continue current medication regimen. - CMP14+EGFR  2. Hyperlipidemia, unspecified hyperlipidemia type Low fat diet. - Lipid  panel  3. Diabetes mellitus without complication ([place]) Patient instructed to decrease Metformin administration to 0.5 a pill daily due to A1C 5.5.  Fasting blood glucose at last appointment 111. - Bayer DCA Hb A1c Waived - Microalbumin / creatinine urine ratio  4. Other specified hypothyroidism Continue taking Synthroid as prescribed.  5. Osteopenia of other site Continue calcium supplementation.  6. BMI 34.0-34.9,adult Discussed diet and exercise for person with BMI >25 Will recheck weight in 3-6 months   7. Recurrent major depressive disorder, in partial remission ([facility]) Take clonazepam as prescribed.  8. GAD (generalized anxiety disorder) Continue anxiolytic as prescribed. - clonazePAM (KLONOPIN) 0.5 MG tablet; Take 1 tablet (0.5 mg total) by mouth 2 (two) times daily.  Dispense: 60 tablet; Refill: 2  9. Peripheral edema Elevate legs when sitting.    Labs pending Health maintenance reviewed Diet and exercise encouraged Continue all meds Follow up  In 3 months.   Marissa Hite, SNP [name], FNP

## 2016-06-20 LAB — CMP14+EGFR
A/G RATIO: 2 (ref 1.2–2.2)
ALK PHOS: 63 IU/L (ref 39–117)
ALT: 12 IU/L (ref 0–32)
AST: 17 IU/L (ref 0–40)
Albumin: 4.4 g/dL (ref 3.5–4.8)
BILIRUBIN TOTAL: 0.4 mg/dL (ref 0.0–1.2)
BUN/Creatinine Ratio: 15 (ref 12–28)
BUN: 14 mg/dL (ref 8–27)
CHLORIDE: 101 mmol/L (ref 96–106)
CO2: 27 mmol/L (ref 18–29)
Calcium: 9.1 mg/dL (ref 8.7–10.3)
Creatinine, Ser: 0.95 mg/dL (ref 0.57–1.00)
GFR calc Af Amer: 66 mL/min/{1.73_m2} (ref >59)
GFR calc non Af Amer: 58 mL/min/{1.73_m2} — ABNORMAL LOW (ref >59)
GLUCOSE: 99 mg/dL (ref 65–99)
Globulin, Total: 2.2 g/dL (ref 1.5–4.5)
POTASSIUM: 4.4 mmol/L (ref 3.5–5.2)
Sodium: 142 mmol/L (ref 134–144)
TOTAL PROTEIN: 6.6 g/dL (ref 6.0–8.5)

## 2016-06-20 LAB — LIPID PANEL
CHOLESTEROL TOTAL: 219 mg/dL — AB (ref 100–199)
Chol/HDL Ratio: 4.4 ratio (ref 0.0–4.4)
HDL: 50 mg/dL (ref >39)
LDL Calculated: 143 mg/dL — ABNORMAL HIGH (ref 0–99)
TRIGLYCERIDES: 131 mg/dL (ref 0–149)
VLDL CHOLESTEROL CAL: 26 mg/dL (ref 5–40)

## 2016-06-20 LAB — MICROALBUMIN / CREATININE URINE RATIO
Creatinine, Urine: 97 mg/dL
Microalb/Creat Ratio: 29.8 mg/g creat (ref 0.0–30.0)
Microalbumin, Urine: 28.9 ug/mL

## 2016-06-23 ENCOUNTER — Ambulatory Visit (INDEPENDENT_AMBULATORY_CARE_PROVIDER_SITE_OTHER): Payer: Medicare HMO | Admitting: Nurse Practitioner

## 2016-06-23 ENCOUNTER — Encounter: Payer: Self-pay | Admitting: Nurse Practitioner

## 2016-06-23 ENCOUNTER — Telehealth: Payer: Self-pay | Admitting: Family

## 2016-06-23 VITALS — BP 123/78 | HR 63 | Temp 96.8°F | Ht 63.0 in | Wt 186.0 lb

## 2016-06-23 DIAGNOSIS — L989 Disorder of the skin and subcutaneous tissue, unspecified: Secondary | ICD-10-CM

## 2016-06-23 DIAGNOSIS — R69 Illness, unspecified: Secondary | ICD-10-CM | POA: Diagnosis not present

## 2016-06-23 MED ORDER — CEPHALEXIN 500 MG PO CAPS: 500.0000 mg | ORAL_CAPSULE | Freq: Three times a day (TID) | ORAL | 0 refills | Status: DC

## 2016-06-23 MED ORDER — GLUCOSE BLOOD VI STRP: ORAL_STRIP | 11 refills | Status: DC

## 2016-06-23 MED ORDER — ONETOUCH ULTRASOFT LANCETS MISC: 11 refills | Status: DC

## 2016-06-23 NOTE — Progress Notes (Signed)
   Subjective:    Patient ID: Veronica Paul, female    DOB: Jun 12, 1935, 81 y.o.   MRN: [id number]  HPI Patient in today c/o a sore place on right lateral foot- said that she noticed it on Friday- has gotten no bigger but gets sorer through out day. SHe thinks that she may have been bitten by something    Review of Systems  Constitutional: Negative.   HENT: Negative.   Respiratory: Negative.   Cardiovascular: Negative.   Neurological: Negative.   Psychiatric/Behavioral: Negative.   All other systems reviewed and are negative.      Objective:   Physical Exam  Constitutional: She appears well-developed and well-nourished.  Cardiovascular: Normal rate.   Pulmonary/Chest: Effort normal.  Skin: Skin is warm.  2cm tender vesicular lesion on lateral side of right foot at 5th MIP joint  Psychiatric: She has a normal mood and affect. Her behavior is normal. Judgment and thought content normal.   BP 123/78   Pulse 63   Temp (!) 96.8 F (36 C) (Oral)   Ht '5\' 3"'$  (1.6 m)   Wt 186 lb (84.4 kg)   LMP 05/11/1992   BMI 32.95 kg/m       Assessment & Plan:   1. Lesion of skin of foot    Meds ordered this encounter  Medications  . cephALEXin (KEFLEX) 500 MG capsule    Sig: Take 1 capsule (500 mg total) by mouth 3 (three) times daily.    Dispense:  30 capsule    Refill:  0    Order Specific Question:   Supervising Provider    Answer:   [place]um[place] in epsom salt daily RTO if worsening  [name], FNP

## 2016-06-23 NOTE — Telephone Encounter (Signed)
What is the name of the medication? One Touch MAchine  Have you contacted your pharmacy to request a refill? YES Which pharmacy would you like this sent to? [facility]   Patient notified that their request is being sent to the clinical staff for review and that they should receive a call once it is complete. If they do not receive a call within 24 hours they can check with their pharmacy or our office.

## 2016-06-23 NOTE — Addendum Note (Signed)
Addended by: Chevis Pretty on: 06/23/2016 09:09 AM   Modules accepted: Orders

## 2016-06-23 NOTE — Patient Instructions (Signed)
Insect Bite, Adult An insect bite can make your skin red, itchy, and swollen. Some insects can spread disease to people with a bite. However, most insect bites do not lead to disease, and most are not serious. Follow these instructions at home: Bite area care   Do not scratch the bite area.  Keep the bite area clean and dry.  Wash the bite area every day with soap and water as told by your doctor.  Check the bite area every day for signs of infection. Check for:  More redness, swelling, or pain.  Fluid or blood.  Warmth.  Pus. Managing pain, itching, and swelling   You may put any of these on the bite area as told by your doctor:  A baking soda paste.  Cortisone cream.  Calamine lotion.  If directed, put ice on the bite area.  Put ice in a plastic bag.  Place a towel between your skin and the bag.  Leave the ice on for 20 minutes, 2-3 times a day. Medicines   Take medicines or put medicines on your skin only as told by your doctor.  If you were prescribed an antibiotic medicine, use it as told by your doctor. Do not stop using the antibiotic even if your condition improves. General instructions   Keep all follow-up visits as told by your doctor. This is important. How is this prevented? To help you have a lower risk of insect bites:  When you are outside, wear clothing that covers your arms and legs.  Use insect repellent. The best insect repellents have:  An active ingredient of DEET, picaridin, oil of lemon eucalyptus (OLE), or IR3535.  Higher amounts of DEET or another active ingredient than other repellents have.  If your home windows do not have screens, think about putting some in. Contact a doctor if:  You have more redness, swelling, or pain in the bite area.  You have fluid, blood, or pus coming from the bite area.  The bite area feels warm.  You have a fever. Get help right away if:  You have joint pain.  You have a rash.  You have  shortness of breath.  You feel more tired or sleepy than you normally do.  You have neck pain.  You have a headache.  You feel weaker than you normally do.  You have chest pain.  You have pain in your belly.  You feel sick to your stomach (nauseous) or you throw up (vomit). Summary  An insect bite can make your skin red, itchy, and swollen.  Do not scratch the bite area, and keep it clean and dry.  Ice can help with pain and itching from the bite. This information is not intended to replace advice given to you by your health care provider. Make sure you discuss any questions you have with your health care provider. Document Released: 02/01/2000 Document Revised: 09/06/2015 Document Reviewed: 06/21/2014 Elsevier Interactive Patient Education  2017 [employer].

## 2016-07-15 ENCOUNTER — Telehealth: Payer: Self-pay | Admitting: *Deleted

## 2016-07-15 ENCOUNTER — Other Ambulatory Visit: Payer: Self-pay

## 2016-07-15 ENCOUNTER — Ambulatory Visit (INDEPENDENT_AMBULATORY_CARE_PROVIDER_SITE_OTHER): Payer: Medicare HMO | Admitting: *Deleted

## 2016-07-15 VITALS — BP 127/64 | HR 59 | Temp 98.7°F | Ht 61.6 in | Wt 189.4 lb

## 2016-07-15 DIAGNOSIS — R69 Illness, unspecified: Secondary | ICD-10-CM | POA: Diagnosis not present

## 2016-07-15 DIAGNOSIS — Z0000 Encounter for general adult medical examination without abnormal findings: Secondary | ICD-10-CM | POA: Diagnosis not present

## 2016-07-15 DIAGNOSIS — L989 Disorder of the skin and subcutaneous tissue, unspecified: Secondary | ICD-10-CM

## 2016-07-15 MED ORDER — CEPHALEXIN 500 MG PO CAPS: 500.0000 mg | ORAL_CAPSULE | Freq: Three times a day (TID) | ORAL | 0 refills | Status: DC

## 2016-07-15 MED ORDER — GLUCOSE BLOOD VI STRP: ORAL_STRIP | 11 refills | Status: DC

## 2016-07-15 MED ORDER — ONETOUCH ULTRA SYSTEM W/DEVICE KIT: 1.0000 | PACK | Freq: Once | 0 refills | Status: AC

## 2016-07-15 NOTE — Patient Instructions (Signed)
Keep follow up appointment with Veronica Paul Done, FNP on 10/02/2016 at 8:15AM Keep Eye appointment in August Schedule Mammogram for September Bring a copy of Advance Directive to be scanned in chart  [name] , Thank you for taking time to come for your Medicare Wellness Visit. I appreciate your ongoing commitment to your health goals. Please review the following plan we discussed and let me know if I can assist you in the future.   These are the goals we discussed: Goals    . Exercise 3x per week (30 min per time)          Increase exercise to 30 min 3 times weekly Treadmill Seated Strengthening Exercise (Handout)    . Have 3 meals a day          Increase lean protein (Lean beef, Chicken, and Fish) Increase fruits and vegetables with every Meal Increase Water intake        This is a list of the screening recommended for you and due dates:  Health Maintenance  Topic Date Due  . Mammogram  09/12/2016  . Flu Shot  09/17/2016  . Eye exam for diabetics  10/18/2016  . Hemoglobin A1C  12/20/2016  . Complete foot exam   03/18/2017  . Tetanus Vaccine  11/03/2020  . DEXA scan (bone density measurement)  Completed  . Pneumonia vaccines  Completed

## 2016-07-15 NOTE — Progress Notes (Signed)
Subjective:   CAROLLYN ETCHEVERRY is a 81 y.o. female who presents for Medicare Annual (Subsequent) preventive examination. Mrs. Anastos lives at home alone but has 3 daughters and 1 son that visits weekly, and a great-granddaughter that stays with her during the summer.  Mrs. Brasel is retired from [employer] of 25 years and [employer] of 3 years.  She enjoys reading romance and mysteries and playing game on the computer such as solitaire.  Mrs. Kiger eats 3 meals daily that consist of cereal, bananas, occasional vegetables for lunch, chips and sandwiches.   She walks on treadmill 5-10 minutes daily.  Overall Mrs. Beman feels that her health is the same as it was a year ago.     Objective:     Vitals: BP 127/64   Pulse (!) 59   Temp 98.7 F (37.1 C) (Oral)   Ht 5' 1.6" (1.565 m)   Wt 189 lb 6.4 oz (85.9 kg)   LMP 05/11/1992   BMI 35.09 kg/m   Body mass index is 35.09 kg/m.   Tobacco History  Smoking Status  . Never Smoker  Smokeless Tobacco  . Never Used     Never a smoker and No smokeless tobacco use.   Past Medical History:  Diagnosis Date  . Depression   . Diabetes mellitus   . Dyslipidemia   . HTN (hypertension)   . Hypothyroidism   . Lung cancer ([place])   . Obesity   . Osteopenia   . Osteopenia   . Vitamin D deficiency    Past Surgical History:  Procedure Laterality Date  . LUNG REMOVAL, PARTIAL  1998   Right  . THYROIDECTOMY     Family History  Problem Relation Age of Onset  . Coronary artery disease Brother 37  . Hypertension Brother   . Heart disease Brother   . Hypertension Mother   . Hypertension Father   . Heart disease Father   . Hypertension Sister   . Heart disease Sister   . Arthritis Daughter   . COPD Daughter   . Fibromyalgia Daughter   . Stroke Maternal Grandmother   . Cancer Brother   . Hypertension Sister   . Colon cancer Neg Hx   . Food intolerance Neg Hx    History  Sexual Activity  . Sexual activity: No     Outpatient Encounter Prescriptions as of 07/15/2016  Medication Sig  . amLODipine (NORVASC) 5 MG tablet Take 1 tablet (5 mg total) by mouth daily.  Marland Kitchen aspirin EC 81 MG EC tablet Take 81 mg by mouth daily.    Marland Kitchen BLACK COHOSH PO Take by mouth.    . clonazePAM (KLONOPIN) 0.5 MG tablet Take 1 tablet (0.5 mg total) by mouth 2 (two) times daily.  . fish oil-omega-3 fatty acids 1000 MG capsule Take 2 g by mouth daily.    . fluticasone (FLONASE) 50 MCG/ACT nasal spray Place 2 sprays into both nostrils daily.  . furosemide (LASIX) 40 MG tablet Take 1 tablet (40 mg total) by mouth daily.  Marland Kitchen glucose blood test strip Check BS BID and PRN . DX.E11.9  . Lancets (ONETOUCH ULTRASOFT) lancets Check BS BID and PRN. DX. E11.9  . levothyroxine (SYNTHROID, LEVOTHROID) 88 MCG tablet Take 1 tablet (88 mcg total) by mouth daily before breakfast.  . losartan (COZAAR) 100 MG tablet Take 1 tablet (100 mg total) by mouth daily.  . metFORMIN (GLUCOPHAGE) 500 MG tablet Take 1 tablet (500 mg total) by  mouth 2 (two) times daily with a meal. (Patient taking differently: Take 500 mg by mouth daily with breakfast. )  . metoprolol (TOPROL-XL) 200 MG 24 hr tablet TAKE 1 & 1/2 TABLETS ONCE DAILY  . Blood Glucose Monitoring Suppl (ONE TOUCH ULTRA SYSTEM KIT) w/Device KIT 1 kit by Does not apply route once.  . cephALEXin (KEFLEX) 500 MG capsule Take 1 capsule (500 mg total) by mouth 3 (three) times daily.  . nitroGLYCERIN (NITROSTAT) 0.4 MG SL tablet Place 1 tablet (0.4 mg total) under the tongue every 5 (five) minutes as needed for chest pain. (Patient not taking: Reported on 07/15/2016)  . [DISCONTINUED] cephALEXin (KEFLEX) 500 MG capsule Take 1 capsule (500 mg total) by mouth 3 (three) times daily.   No facility-administered encounter medications on file as of 07/15/2016.     Activities of Daily Living In your present state of health, do you have any difficulty performing the following activities: 07/15/2016  Hearing? N   Vision? N  Difficulty concentrating or making decisions? N  Walking or climbing stairs? N  Dressing or bathing? N  Doing errands, shopping? N  Some recent data might be hidden  Mrs. Meador has no trouble with ADLs. Currently wearing glasses,  Eye appt set of 10/17/2016 No trouble hearing.   Patient Care Team: Chevis Pretty, FNP as PCP - General (Family Medicine) Minus Breeding, MD as Consulting Physician (Cardiology)    Assessment:     Exercise Activities and Dietary recommendations Current Exercise Habits: Home exercise routine, Type of exercise: treadmill, Time (Minutes): 10, Frequency (Times/Week): 7, Weekly Exercise (Minutes/Week): 70, Intensity: Mild, Exercise limited by: None identified  Goals    . Exercise 3x per week (30 min per time)          Increase exercise to 30 min 3 times weekly Treadmill Seated Strengthening Exercise (Handout)    . Have 3 meals a day          Increase lean protein (Lean beef, Chicken, and Fish) Increase fruits and vegetables with every Meal Increase Water intake       Fall Risk Fall Risk  07/15/2016 06/23/2016 06/19/2016 03/28/2016 03/18/2016  Falls in the past year? No No No No No  No tripping hazards at home Depression Screen [facility] 2/9 Scores 07/15/2016 06/23/2016 06/19/2016 03/28/2016  PHQ - 2 Score 0 0 0 0  PHQ- 9 Score - - - -  Exception Documentation - - - -     Cognitive Function MMSE - Mini Mental State Exam 07/15/2016  Orientation to time 5  Orientation to Place 5  Registration 3  Attention/ Calculation 5  Recall 2  Language- name 2 objects 2  Language- repeat 1  Language- follow 3 step command 3  Language- read & follow direction 1  Write a sentence 1  Copy design 1  Total score 29        Immunization History  Administered Date(s) Administered  . Influenza Split 01/17/2010  . Influenza Whole 11/07/2011  . Influenza, High Dose Seasonal PF 12/14/2015  . Influenza,inj,Quad PF,36+ Mos 11/08/2012, 12/02/2013,  12/28/2014  . Pneumococcal Conjugate-13 05/23/2013  . Pneumococcal Polysaccharide-23 12/19/2007  . Td 02/18/2003  . Tdap 11/04/2010  . Zoster 08/13/2011   Screening Tests Health Maintenance  Topic Date Due  . MAMMOGRAM  09/12/2016  . INFLUENZA VACCINE  09/17/2016  . OPHTHALMOLOGY EXAM  10/18/2016  . HEMOGLOBIN A1C  12/20/2016  . FOOT EXAM  03/18/2017  . TETANUS/TDAP  11/03/2020  . DEXA SCAN  Completed  . PNA vac Low Risk Adult  Completed      Plan:   Encourage Mrs. Johansson to increase fruits, vegetables, lean protein, and water intake.  Encourage patient to increase exercise to 30 minutes 3 times weekly of walking and/or seated strengthening exercise (Handout given).  Also, informed patient to bring a copy of her advance directive to this office to be scanned into her chart.  Mrs. Brissette is up to date on vaccinations, colonoscopy, mammogram and eye exam.  Patient has an eye exam scheduled for 10/17/2016 at [facility], and will schedule 1 year mammogram for September 2018.  Encouraged Mrs. Lavalle to keep 3 month follow up with [name], [place] in august of 2018.   I have personally reviewed and noted the following in the patient's chart:   . Medical and social history . Use of alcohol, tobacco or illicit drugs  . Current medications and supplements . Functional ability and status . Nutritional status . Physical activity . Advanced directives . List of other physicians . Hospitalizations, surgeries, and ER visits in previous 12 months . Vitals . Screenings to include cognitive, depression, and falls . Referrals and appointments  In addition, I have reviewed and discussed with patient certain preventive protocols, quality metrics, and best practice recommendations. A written personalized care plan for preventive services as well as general preventive health recommendations were provided to patient.     Wardell Heath, LPN  4/74/[id number]  I have reviewed and agree  with the above AWV documentation.   [name], FNP

## 2016-07-15 NOTE — Telephone Encounter (Signed)
Patient in today for AWV.  While at [facility] patient complains of spider/bug bite on the left foot.  Patient states that she notice spot on foot two days ago.  Mrs. Veronica Paul just completed antibiotic 2 weeks ago for same thing.  Spoke with Ant[place], FNP.  Per MMM another round of Keflex for 10 days sent to pharmacy.  Informed patient is spot gets any worse or any other bites come up.  Patient verbalized understanding.

## 2016-07-25 DIAGNOSIS — Z6834 Body mass index (BMI) 34.0-34.9, adult: Secondary | ICD-10-CM | POA: Diagnosis not present

## 2016-07-25 DIAGNOSIS — J309 Allergic rhinitis, unspecified: Secondary | ICD-10-CM | POA: Diagnosis not present

## 2016-07-25 DIAGNOSIS — Z79899 Other long term (current) drug therapy: Secondary | ICD-10-CM | POA: Diagnosis not present

## 2016-07-25 DIAGNOSIS — E039 Hypothyroidism, unspecified: Secondary | ICD-10-CM | POA: Diagnosis not present

## 2016-07-25 DIAGNOSIS — Z0000 Encounter for general adult medical examination without abnormal findings: Secondary | ICD-10-CM | POA: Diagnosis not present

## 2016-07-25 DIAGNOSIS — K08409 Partial loss of teeth, unspecified cause, unspecified class: Secondary | ICD-10-CM | POA: Diagnosis not present

## 2016-07-25 DIAGNOSIS — E669 Obesity, unspecified: Secondary | ICD-10-CM | POA: Diagnosis not present

## 2016-07-25 DIAGNOSIS — I10 Essential (primary) hypertension: Secondary | ICD-10-CM | POA: Diagnosis not present

## 2016-07-25 DIAGNOSIS — R69 Illness, unspecified: Secondary | ICD-10-CM | POA: Diagnosis not present

## 2016-07-25 DIAGNOSIS — R600 Localized edema: Secondary | ICD-10-CM | POA: Diagnosis not present

## 2016-07-25 DIAGNOSIS — Z972 Presence of dental prosthetic device (complete) (partial): Secondary | ICD-10-CM | POA: Diagnosis not present

## 2016-07-25 DIAGNOSIS — E119 Type 2 diabetes mellitus without complications: Secondary | ICD-10-CM | POA: Diagnosis not present

## 2016-08-19 ENCOUNTER — Other Ambulatory Visit: Payer: Self-pay | Admitting: Nurse Practitioner

## 2016-08-19 DIAGNOSIS — F411 Generalized anxiety disorder: Secondary | ICD-10-CM

## 2016-08-19 NOTE — Telephone Encounter (Signed)
Please call in clonozapam with 1 refills

## 2016-08-19 NOTE — Telephone Encounter (Signed)
Looks like it is not due, could not get on phone?

## 2016-08-22 NOTE — Telephone Encounter (Signed)
Phoned in.

## 2016-08-30 DIAGNOSIS — R69 Illness, unspecified: Secondary | ICD-10-CM | POA: Diagnosis not present

## 2016-10-02 ENCOUNTER — Ambulatory Visit (INDEPENDENT_AMBULATORY_CARE_PROVIDER_SITE_OTHER): Payer: Medicare HMO

## 2016-10-02 ENCOUNTER — Ambulatory Visit (INDEPENDENT_AMBULATORY_CARE_PROVIDER_SITE_OTHER): Payer: Medicare HMO | Admitting: Nurse Practitioner

## 2016-10-02 ENCOUNTER — Encounter: Payer: Self-pay | Admitting: Nurse Practitioner

## 2016-10-02 VITALS — BP 136/67 | HR 63 | Temp 96.9°F | Ht 61.0 in | Wt 195.0 lb

## 2016-10-02 DIAGNOSIS — E119 Type 2 diabetes mellitus without complications: Secondary | ICD-10-CM

## 2016-10-02 DIAGNOSIS — R69 Illness, unspecified: Secondary | ICD-10-CM | POA: Diagnosis not present

## 2016-10-02 DIAGNOSIS — I10 Essential (primary) hypertension: Secondary | ICD-10-CM | POA: Diagnosis not present

## 2016-10-02 DIAGNOSIS — M2550 Pain in unspecified joint: Secondary | ICD-10-CM

## 2016-10-02 DIAGNOSIS — F411 Generalized anxiety disorder: Secondary | ICD-10-CM

## 2016-10-02 DIAGNOSIS — R609 Edema, unspecified: Secondary | ICD-10-CM | POA: Diagnosis not present

## 2016-10-02 DIAGNOSIS — M8588 Other specified disorders of bone density and structure, other site: Secondary | ICD-10-CM | POA: Diagnosis not present

## 2016-10-02 DIAGNOSIS — Z6834 Body mass index (BMI) 34.0-34.9, adult: Secondary | ICD-10-CM

## 2016-10-02 DIAGNOSIS — E782 Mixed hyperlipidemia: Secondary | ICD-10-CM

## 2016-10-02 DIAGNOSIS — E038 Other specified hypothyroidism: Secondary | ICD-10-CM | POA: Diagnosis not present

## 2016-10-02 DIAGNOSIS — F3341 Major depressive disorder, recurrent, in partial remission: Secondary | ICD-10-CM | POA: Diagnosis not present

## 2016-10-02 LAB — BAYER DCA HB A1C WAIVED: HB A1C: 6.3 % (ref <7.0)

## 2016-10-02 MED ORDER — CELECOXIB 200 MG PO CAPS: 200.0000 mg | ORAL_CAPSULE | Freq: Two times a day (BID) | ORAL | 2 refills | Status: DC

## 2016-10-02 MED ORDER — METFORMIN HCL 500 MG PO TABS: 500.0000 mg | ORAL_TABLET | Freq: Two times a day (BID) | ORAL | 5 refills | Status: DC

## 2016-10-02 MED ORDER — LOSARTAN POTASSIUM 100 MG PO TABS: 100.0000 mg | ORAL_TABLET | Freq: Every day | ORAL | 1 refills | Status: DC

## 2016-10-02 MED ORDER — CLONAZEPAM 0.5 MG PO TABS: 0.5000 mg | ORAL_TABLET | Freq: Two times a day (BID) | ORAL | 2 refills | Status: DC

## 2016-10-02 MED ORDER — FUROSEMIDE 40 MG PO TABS: 40.0000 mg | ORAL_TABLET | Freq: Every day | ORAL | 1 refills | Status: DC

## 2016-10-02 MED ORDER — LEVOTHYROXINE SODIUM 88 MCG PO TABS: ORAL_TABLET | ORAL | 1 refills | Status: DC

## 2016-10-02 MED ORDER — METOPROLOL SUCCINATE ER 200 MG PO TB24: ORAL_TABLET | ORAL | 1 refills | Status: DC

## 2016-10-02 MED ORDER — AMLODIPINE BESYLATE 5 MG PO TABS: 5.0000 mg | ORAL_TABLET | Freq: Every day | ORAL | 5 refills | Status: DC

## 2016-10-02 NOTE — Patient Instructions (Signed)
Arthritis Arthritis means joint pain. It can also mean joint disease. A joint is a place where bones come together. People who have arthritis may have:  Red joints.  Swollen joints.  Stiff joints.  Warm joints.  A fever.  A feeling of being sick.  Follow these instructions at home: Pay attention to any changes in your symptoms. Take these actions to help with your pain and swelling. Medicines  Take over-the-counter and prescription medicines only as told by your doctor.  Do not take aspirin for pain if your doctor says that you may have gout. Activity  Rest your joint if your doctor tells you to.  Avoid activities that make the pain worse.  Exercise your joint regularly as told by your doctor. Try doing exercises like: ? Swimming. ? Water aerobics. ? Biking. ? Walking. Joint Care   If your joint is swollen, keep it raised (elevated) if told by your doctor.  If your joint feels stiff in the morning, try taking a warm shower.  If you have diabetes, do not apply heat without asking your doctor.  If told, apply heat to the joint: ? Put a towel between the joint and the hot pack or heating pad. ? Leave the heat on the area for 20-30 minutes.  If told, apply ice to the joint: ? Put ice in a plastic bag. ? Place a towel between your skin and the bag. ? Leave the ice on for 20 minutes, 2-3 times per day.  Keep all follow-up visits as told by your doctor. Contact a doctor if:  The pain gets worse.  You have a fever. Get help right away if:  You have very bad pain in your joint.  You have swelling in your joint.  Your joint is red.  Many joints become painful and swollen.  You have very bad back pain.  Your leg is very weak.  You cannot control your pee (urine) or poop (stool). This information is not intended to replace advice given to you by your health care provider. Make sure you discuss any questions you have with your health care provider. Document  Released: 04/30/2009 Document Revised: 07/12/2015 Document Reviewed: 05/01/2014 Elsevier Interactive Patient Education  2018 [employer] Inc[employer]  

## 2016-10-02 NOTE — Progress Notes (Addendum)
Subjective:    Patient ID: Veronica Paul, female    DOB: 1935/03/16, 81 y.o.   MRN: [id number]  HPI   Veronica Paul is here today for follow up of chronic medical problem.  Outpatient Encounter Prescriptions as of 10/02/2016  Medication Sig  . amLODipine (NORVASC) 5 MG tablet Take 1 tablet (5 mg total) by mouth daily.  Marland Kitchen aspirin EC 81 MG EC tablet Take 81 mg by mouth daily.    Marland Kitchen BLACK COHOSH PO Take by mouth.    . clonazePAM (KLONOPIN) 0.5 MG tablet Take 1 tablet (0.5 mg total) by mouth 2 (two) times daily.  . fish oil-omega-3 fatty acids 1000 MG capsule Take 2 g by mouth daily.    . fluticasone (FLONASE) 50 MCG/ACT nasal spray Place 2 sprays into both nostrils daily.  . furosemide (LASIX) 40 MG tablet Take 1 tablet (40 mg total) by mouth daily.  Marland Kitchen glucose blood test strip Check BS BID and PRN . DX.E11.9  . Lancets (ONETOUCH ULTRASOFT) lancets Check BS BID and PRN. DX. E11.9  . levothyroxine (SYNTHROID, LEVOTHROID) 88 MCG tablet Take 1 tablet (88 mcg total) by mouth daily before breakfast.  . losartan (COZAAR) 100 MG tablet Take 1 tablet (100 mg total) by mouth daily.  . metFORMIN (GLUCOPHAGE) 500 MG tablet Take 1 tablet (500 mg total) by mouth 2 (two) times daily with a meal.  . metoprolol (TOPROL-XL) 200 MG 24 hr tablet TAKE 1 & 1/2 TABLETS ONCE DAILY  . nitroGLYCERIN (NITROSTAT) 0.4 MG SL tablet Place 1 tablet (0.4 mg total) under the tongue every 5 (five) minutes as needed for chest pain. (Patient not taking: Reported on 07/15/2016)     1. Hypertension, unspecified type  No chest pain, SOB or headache. Does not check blood pressure at home  2. Other specified hypothyroidism  No problems  3. Diabetes mellitus without complication (HCC)  Last HGBA1c 5.5%. Blood sugars average around 100-110. No hypoglycemia  4. Osteopenia of other site  Very little weight bearing exercise  5. Peripheral edema  Usually resolves with elevating legs  6. Mixed hyperlipidemia  Does not watch fat  sin diet  7. GAD (generalized anxiety disorder)  Takes klonopin BID GAD 7 : Generalized Anxiety Score 10/02/2016  Nervous, Anxious, on Edge 0  Control/stop worrying 0  Worry too much - different things 0  Trouble relaxing 0  Restless 0  Easily annoyed or irritable 0  Afraid - awful might happen 0  Total GAD 7 Score 0  Anxiety Difficulty Not difficult at all      8. Recurrent major depressive disorder, in partial remission [facility])  Depression screen [facility] 2/9 10/02/2016 07/15/2016 06/23/2016 06/19/2016 03/28/2016  Decreased Interest 0 0 0 0 0  Down, Depressed, Hopeless 0 0 0 0 0  PHQ - 2 Score 0 0 0 0 0  Altered sleeping - - - - -  Tired, decreased energy - - - - -  Change in appetite - - - - -  Feeling bad or failure about yourself  - - - - -  Trouble concentrating - - - - -  Moving slowly or fidgety/restless - - - - -  Suicidal thoughts - - - - -  PHQ-9 Score - - - - -     9. BMI 34.0-34.9,adult  No recent weight changes       New complaints: Sister just died from lung cancer and patient wants to get chest xray to make sure she  is okay  Social history: Lives alone  Review of Systems  Constitutional: Negative for activity change and appetite change.  HENT: Negative.   Eyes: Negative for pain.  Respiratory: Negative for shortness of breath.   Cardiovascular: Negative for chest pain, palpitations and leg swelling.  Gastrointestinal: Negative for abdominal pain.  Endocrine: Negative for polydipsia.  Genitourinary: Negative.   Skin: Negative for rash.  Neurological: Negative for dizziness, weakness and headaches.  Hematological: Does not bruise/bleed easily.  Psychiatric/Behavioral: Negative.   All other systems reviewed and are negative.      Objective:   Physical Exam  Constitutional: She is oriented to person, place, and time. She appears well-developed and well-nourished.  HENT:  Nose: Nose normal.  Mouth/Throat: Oropharynx is clear and moist.  Eyes: EOM are  normal.  Neck: Trachea normal, normal range of motion and full passive range of motion without pain. Neck supple. No JVD present. Carotid bruit is not present. No thyromegaly present.  Cardiovascular: Normal rate, regular rhythm, normal heart sounds and intact distal pulses.  Exam reveals no gallop and no friction rub.   No murmur heard. Pulmonary/Chest: Effort normal and breath sounds normal.  Abdominal: Soft. Bowel sounds are normal. She exhibits no distension and no mass. There is no tenderness.  Musculoskeletal: Normal range of motion.  Lymphadenopathy:    She has no cervical adenopathy.  Neurological: She is alert and oriented to person, place, and time. She has normal reflexes.  Skin: Skin is warm and dry.  Psychiatric: She has a normal mood and affect. Her behavior is normal. Judgment and thought content normal.   BP 136/67   Pulse 63   Temp (!) 96.9 F (36.1 C) (Oral)   Ht '5\' 1"'  (1.549 m)   Wt 195 lb (88.5 kg)   LMP 05/11/1992   BMI 36.84 kg/m   Chest x ray- no acute or chronic findings-Preliminary reading by Ronnald Collum, FNP  [facility]       Assessment & Plan:  1. Other specified hypothyroidism - levothyroxine (SYNTHROID, LEVOTHROID) 88 MCG tablet; Take 1 tablet (88 mcg total) by mouth daily before breakfast.  Dispense: 90 tablet; Refill: 1  2. Diabetes mellitus without complication ([place]) Continue to watch carbs in diet - metFORMIN (GLUCOPHAGE) 500 MG tablet; Take 1 tablet (500 mg total) by mouth 2 (two) times daily with a meal.  Dispense: 60 tablet; Refill: 5 - Bayer DCA Hb A1c Waived  3. Osteopenia of other site Weight bearing exercises if can tolerate  4. Peripheral edema Elevate legs when sitting - furosemide (LASIX) 40 MG tablet; Take 1 tablet (40 mg total) by mouth daily.  Dispense: 90 tablet; Refill: 1  5. Mixed hyperlipidemia Low fat diet - Lipid panel  6. GAD (generalized anxiety disorder) Stress management - clonazePAM (KLONOPIN) 0.5 MG tablet; Take 1  tablet (0.5 mg total) by mouth 2 (two) times daily.  Dispense: 60 tablet; Refill: 2  7. Recurrent major depressive disorder, in partial remission ([place])  8. BMI 34.0-34.9,adult Discussed diet and exercise for person with BMI >25 Will recheck weight in 3-6 months  9. Essential hypertension Low sodium diet - amLODipine (NORVASC) 5 MG tablet; Take 1 tablet (5 mg total) by mouth daily.  Dispense: 30 tablet; Refill: 5 - losartan (COZAAR) 100 MG tablet; Take 1 tablet (100 mg total) by mouth daily.  Dispense: 90 tablet; Refill: 1 - metoprolol (TOPROL-XL) 200 MG 24 hr tablet; TAKE 1 & 1/2 TABLETS ONCE DAILY  Dispense: 135 tablet; Refill: 1 -  CMP14+EGFR - DG Chest 2 View; Future  10. Arthralgia, unspecified joint celebrex 271m 1 daily    Labs pending Health maintenance reviewed Diet and exercise encouraged Continue all meds Follow up  In 3 months   MThiells FNP

## 2016-10-03 LAB — LIPID PANEL
Chol/HDL Ratio: 4.1 ratio (ref 0.0–4.4)
Cholesterol, Total: 215 mg/dL — ABNORMAL HIGH (ref 100–199)
HDL: 52 mg/dL (ref >39)
LDL Calculated: 134 mg/dL — ABNORMAL HIGH (ref 0–99)
TRIGLYCERIDES: 147 mg/dL (ref 0–149)
VLDL Cholesterol Cal: 29 mg/dL (ref 5–40)

## 2016-10-03 LAB — CMP14+EGFR
A/G RATIO: 1.8 (ref 1.2–2.2)
ALT: 13 IU/L (ref 0–32)
AST: 17 IU/L (ref 0–40)
Albumin: 4.2 g/dL (ref 3.5–4.8)
Alkaline Phosphatase: 64 IU/L (ref 39–117)
BILIRUBIN TOTAL: 0.4 mg/dL (ref 0.0–1.2)
BUN/Creatinine Ratio: 16 (ref 12–28)
BUN: 16 mg/dL (ref 8–27)
CHLORIDE: 102 mmol/L (ref 96–106)
CO2: 27 mmol/L (ref 20–29)
Calcium: 9.4 mg/dL (ref 8.7–10.3)
Creatinine, Ser: 0.99 mg/dL (ref 0.57–1.00)
GFR calc Af Amer: 63 mL/min/{1.73_m2} (ref >59)
GFR calc non Af Amer: 54 mL/min/{1.73_m2} — ABNORMAL LOW (ref >59)
Globulin, Total: 2.4 g/dL (ref 1.5–4.5)
Glucose: 98 mg/dL (ref 65–99)
POTASSIUM: 4.2 mmol/L (ref 3.5–5.2)
Sodium: 143 mmol/L (ref 134–144)
Total Protein: 6.6 g/dL (ref 6.0–8.5)

## 2016-10-22 DIAGNOSIS — R69 Illness, unspecified: Secondary | ICD-10-CM | POA: Diagnosis not present

## 2016-11-02 ENCOUNTER — Other Ambulatory Visit: Payer: Self-pay | Admitting: Nurse Practitioner

## 2016-11-02 DIAGNOSIS — I10 Essential (primary) hypertension: Secondary | ICD-10-CM

## 2016-11-07 ENCOUNTER — Other Ambulatory Visit: Payer: Self-pay | Admitting: Nurse Practitioner

## 2016-11-07 DIAGNOSIS — I10 Essential (primary) hypertension: Secondary | ICD-10-CM

## 2016-11-13 ENCOUNTER — Other Ambulatory Visit: Payer: Self-pay | Admitting: Nurse Practitioner

## 2016-11-13 DIAGNOSIS — E038 Other specified hypothyroidism: Secondary | ICD-10-CM

## 2016-11-13 DIAGNOSIS — R609 Edema, unspecified: Secondary | ICD-10-CM

## 2016-11-13 DIAGNOSIS — E119 Type 2 diabetes mellitus without complications: Secondary | ICD-10-CM

## 2016-11-27 ENCOUNTER — Other Ambulatory Visit: Payer: Self-pay | Admitting: Nurse Practitioner

## 2016-11-27 DIAGNOSIS — F411 Generalized anxiety disorder: Secondary | ICD-10-CM

## 2016-11-28 NOTE — Telephone Encounter (Signed)
Call[facility] to [facility]. Patient notified

## 2016-11-28 NOTE — Telephone Encounter (Signed)
Please call in clonazepam with 1 refills

## 2016-12-01 ENCOUNTER — Ambulatory Visit (INDEPENDENT_AMBULATORY_CARE_PROVIDER_SITE_OTHER): Payer: Medicare HMO

## 2016-12-01 DIAGNOSIS — Z23 Encounter for immunization: Secondary | ICD-10-CM

## 2016-12-02 ENCOUNTER — Ambulatory Visit: Payer: Medicare HMO

## 2017-01-12 ENCOUNTER — Ambulatory Visit (INDEPENDENT_AMBULATORY_CARE_PROVIDER_SITE_OTHER): Payer: Medicare HMO | Admitting: Nurse Practitioner

## 2017-01-12 ENCOUNTER — Encounter: Payer: Self-pay | Admitting: Nurse Practitioner

## 2017-01-12 ENCOUNTER — Telehealth: Payer: Self-pay | Admitting: Nurse Practitioner

## 2017-01-12 VITALS — BP 124/73 | HR 72 | Temp 96.8°F | Ht 61.0 in | Wt 201.0 lb

## 2017-01-12 DIAGNOSIS — E782 Mixed hyperlipidemia: Secondary | ICD-10-CM | POA: Diagnosis not present

## 2017-01-12 DIAGNOSIS — M8588 Other specified disorders of bone density and structure, other site: Secondary | ICD-10-CM

## 2017-01-12 DIAGNOSIS — Z6834 Body mass index (BMI) 34.0-34.9, adult: Secondary | ICD-10-CM

## 2017-01-12 DIAGNOSIS — E119 Type 2 diabetes mellitus without complications: Secondary | ICD-10-CM | POA: Diagnosis not present

## 2017-01-12 DIAGNOSIS — R600 Localized edema: Secondary | ICD-10-CM

## 2017-01-12 DIAGNOSIS — F3341 Major depressive disorder, recurrent, in partial remission: Secondary | ICD-10-CM

## 2017-01-12 DIAGNOSIS — I10 Essential (primary) hypertension: Secondary | ICD-10-CM

## 2017-01-12 DIAGNOSIS — B372 Candidiasis of skin and nail: Secondary | ICD-10-CM

## 2017-01-12 DIAGNOSIS — R609 Edema, unspecified: Secondary | ICD-10-CM

## 2017-01-12 DIAGNOSIS — R69 Illness, unspecified: Secondary | ICD-10-CM | POA: Diagnosis not present

## 2017-01-12 DIAGNOSIS — E038 Other specified hypothyroidism: Secondary | ICD-10-CM | POA: Diagnosis not present

## 2017-01-12 DIAGNOSIS — F411 Generalized anxiety disorder: Secondary | ICD-10-CM

## 2017-01-12 LAB — BAYER DCA HB A1C WAIVED: HB A1C (BAYER DCA - WAIVED): 6.1 % (ref <7.0)

## 2017-01-12 MED ORDER — METFORMIN HCL 500 MG PO TABS: 500.0000 mg | ORAL_TABLET | Freq: Every day | ORAL | 1 refills | Status: DC

## 2017-01-12 MED ORDER — CLONAZEPAM 0.5 MG PO TABS: 0.5000 mg | ORAL_TABLET | Freq: Two times a day (BID) | ORAL | 1 refills | Status: DC | PRN

## 2017-01-12 MED ORDER — LEVOTHYROXINE SODIUM 88 MCG PO TABS: ORAL_TABLET | ORAL | 1 refills | Status: DC

## 2017-01-12 MED ORDER — LOSARTAN POTASSIUM 100 MG PO TABS: 100.0000 mg | ORAL_TABLET | Freq: Every day | ORAL | 1 refills | Status: DC

## 2017-01-12 MED ORDER — AMLODIPINE BESYLATE 5 MG PO TABS: 5.0000 mg | ORAL_TABLET | Freq: Every day | ORAL | 1 refills | Status: DC

## 2017-01-12 MED ORDER — FUROSEMIDE 40 MG PO TABS: 40.0000 mg | ORAL_TABLET | Freq: Every day | ORAL | 1 refills | Status: DC

## 2017-01-12 MED ORDER — NYSTATIN 100000 UNIT/GM EX CREA: 1.0000 "application " | TOPICAL_CREAM | Freq: Two times a day (BID) | CUTANEOUS | 5 refills | Status: DC

## 2017-01-12 MED ORDER — METOPROLOL SUCCINATE ER 200 MG PO TB24: ORAL_TABLET | ORAL | 1 refills | Status: DC

## 2017-01-12 NOTE — Telephone Encounter (Signed)
Med was sent to pharmacy

## 2017-01-12 NOTE — Addendum Note (Signed)
Addended by: Chevis Pretty on: 01/12/2017 03:55 PM   Modules accepted: Orders

## 2017-01-12 NOTE — Patient Instructions (Signed)

## 2017-01-12 NOTE — Progress Notes (Addendum)
Subjective:    Patient ID: Veronica Paul, female    DOB: 09-14-1935, 81 y.o.   MRN: [id number]  HPI  Veronica Paul is here today for follow up of chronic medical problem.  Outpatient Encounter Medications as of 01/12/2017  Medication Sig  . amLODipine (NORVASC) 5 MG tablet TAKE 1 TABLET BY MOUTH EVERY DAY  . aspirin EC 81 MG EC tablet Take 81 mg by mouth daily.    Marland Kitchen BLACK COHOSH PO Take by mouth.    . celecoxib (CELEBREX) 200 MG capsule Take 1 capsule (200 mg total) by mouth 2 (two) times daily.  . clonazePAM (KLONOPIN) 0.5 MG tablet TAKE 1 TABLET BY MOUTH TWICE A DAY AS NEEDED  . fish oil-omega-3 fatty acids 1000 MG capsule Take 2 g by mouth daily.    . fluticasone (FLONASE) 50 MCG/ACT nasal spray Place 2 sprays into both nostrils daily.  . furosemide (LASIX) 40 MG tablet TAKE 1 TABLET BY MOUTH EVERY DAY  . glucose blood test strip Check BS BID and PRN . DX.E11.9  . Lancets (ONETOUCH ULTRASOFT) lancets Check BS BID and PRN. DX. E11.9  . levothyroxine (SYNTHROID, LEVOTHROID) 88 MCG tablet TAKE 1 TABLET BY MOUTH EVERY DAY BEFORE BREAKFAST  . losartan (COZAAR) 100 MG tablet TAKE 1 TABLET BY MOUTH EVERY DAY  . metFORMIN (GLUCOPHAGE) 500 MG tablet Take 500 mg by mouth daily.  . metoprolol (TOPROL-XL) 200 MG 24 hr tablet TAKE 1 AND 1/2 TABLET DAILY  . nitroGLYCERIN (NITROSTAT) 0.4 MG SL tablet Place 1 tablet (0.4 mg total) under the tongue every 5 (five) minutes as needed for chest pain.    1. Mixed hyperlipidemia  Patient does not wacth diet at all.  2. Diabetes mellitus without complication ([place])  Last hgba1c was 6.3%. Fasting blood sugar usually ranges from 80-110. She denies hypoglycemia.  3. Essential hypertension  No c/o chest  Pain, SOB or headache. Does not check blood pressure at home. BP Readings from Last 3 Encounters:  01/12/17 124/73  10/02/16 136/67  07/15/16 127/64     4. Other specified hypothyroidism  No problems that she is aware of.  5. Osteopenia of other  site  Denies back pain. Tries to do walking evryday  6. BMI 34.0-34.9,adult  No recent weigt changes  7. Recurrent major depressive disorder, in partial remission ([place])  Patient is not on antidepressant- she says that  She is doing well Depression screen [facility] 2/9 01/12/2017 10/02/2016 07/15/2016  Decreased Interest 0 0 0  Down, Depressed, Hopeless 0 0 0  PHQ - 2 Score 0 0 0  Altered sleeping - - -  Tired, decreased energy - - -  Change in appetite - - -  Feeling bad or failure about yourself  - - -  Trouble concentrating - - -  Moving slowly or fidgety/restless - - -  Suicidal thoughts - - -  PHQ-9 Score - - -     8. Peripheral edema  Has edema almost dailly but elevating legs .  9. GAD (generalized anxiety disorder)   is on konopin. She takes it at least one time a day. Sometimes she will take bid.    New complaints: Has yeast in abdominal fold and in groin area. Has had for awhile and has been using hydrocortisone cream on it.  Social history: Lives alone. Has family locally that visit often   Review of Systems  Constitutional: Negative for activity change and appetite change.  HENT: Negative.   Eyes:  Negative for pain.  Respiratory: Negative for shortness of breath.   Cardiovascular: Negative for chest pain, palpitations and leg swelling.  Gastrointestinal: Negative for abdominal pain.  Endocrine: Negative for polydipsia.  Genitourinary: Negative.   Skin: Negative for rash.  Neurological: Negative for dizziness, weakness and headaches.  Hematological: Does not bruise/bleed easily.  Psychiatric/Behavioral: Negative.   All other systems reviewed and are negative.      Objective:   Physical Exam  Constitutional: She is oriented to person, place, and time. She appears well-developed and well-nourished.  HENT:  Nose: Nose normal.  Mouth/Throat: Oropharynx is clear and moist.  Eyes: EOM are normal.  Neck: Trachea normal, normal range of motion and full passive range  of motion without pain. Neck supple. No JVD present. Carotid bruit is not present. No thyromegaly present.  Cardiovascular: Normal rate, regular rhythm, normal heart sounds and intact distal pulses. Exam reveals no gallop and no friction rub.  No murmur heard. Pulmonary/Chest: Effort normal and breath sounds normal.  Abdominal: Soft. Bowel sounds are normal. She exhibits no distension and no mass. There is no tenderness.  Musculoskeletal: Normal range of motion.  Lymphadenopathy:    She has no cervical adenopathy.  Neurological: She is alert and oriented to person, place, and time. She has normal reflexes.  Skin: Skin is warm and dry.  Erythematous moist rash along abdominal fold and il groin area  Psychiatric: She has a normal mood and affect. Her behavior is normal. Judgment and thought content normal.   BP 124/73   Pulse 72   Temp (!) 96.8 F (36 C) (Oral)   Ht _0  (1.549 m)   Wt 201 lb (91.2 kg)   LMP 05/11/1992   BMI 37.98 kg/m   HGBA1c 6.1% today       Assessment & Plan:  1. Mixed hyperlipidemia Low fat diet - Lipid panel  2. Diabetes mellitus without complication ([place]) Continue to watch carbs in diet - Bayer DCA Hb A1c Waived  3. Essential hypertension Low sodium diet - CMP14+EGFR - metoprolol (TOPROL-XL) 200 MG 24 hr tablet; TAKE 1 AND 1/2 TABLET DAILY  Dispense: 135 tablet; Refill: 1 - losartan (COZAAR) 100 MG tablet; Take 1 tablet (100 mg total) by mouth daily.  Dispense: 90 tablet; Refill: 1 - amLODipine (NORVASC) 5 MG tablet; Take 1 tablet (5 mg total) by mouth daily.  Dispense: 90 tablet; Refill: 1  4. Other specified hypothyroidism - levothyroxine (SYNTHROID, LEVOTHROID) 88 MCG tablet; TAKE 1 TABLET BY MOUTH EVERY DAY BEFORE BREAKFAST  Dispense: 90 tablet; Refill: 1  5. Osteopenia of other site Weight bearing exeicses encouraged  6. BMI 34.0-34.9,adult Discussed diet and exercise for person with BMI >25 Will recheck weight in 3-6 months  7.  Recurrent major depressive disorder, in partial remission ([place])  8. Peripheral edema Elevate legs when sitting Compression socks - furosemide (LASIX) 40 MG tablet; Take 1 tablet (40 mg total) by mouth daily.  Dispense: 90 tablet; Refill: 1  9. GAD (generalized anxiety disorder) stress management - clonazePAM (KLONOPIN) 0.5 MG tablet; Take 1 tablet (0.5 mg total) by mouth 2 (two) times daily as needed.  Dispense: 60 tablet; Refill: 1  10. Cutaneous candidiasis Nystatin cream apply BID Keep area clean and dry  Labs pending Health maintenance reviewed Diet and exercise encouraged Continue all meds Follow up  In 3 months   [place], FNP

## 2017-01-12 NOTE — Telephone Encounter (Signed)
Pt aware rx was sent to pharmacy 

## 2017-01-13 LAB — CMP14+EGFR
ALBUMIN: 4.3 g/dL (ref 3.5–4.8)
ALT: 14 IU/L (ref 0–32)
AST: 15 IU/L (ref 0–40)
Albumin/Globulin Ratio: 2 (ref 1.2–2.2)
Alkaline Phosphatase: 66 IU/L (ref 39–117)
BILIRUBIN TOTAL: 0.5 mg/dL (ref 0.0–1.2)
BUN / CREAT RATIO: 11 — AB (ref 12–28)
BUN: 12 mg/dL (ref 8–27)
CO2: 24 mmol/L (ref 20–29)
CREATININE: 1.05 mg/dL — AB (ref 0.57–1.00)
Calcium: 9.4 mg/dL (ref 8.7–10.3)
Chloride: 104 mmol/L (ref 96–106)
GFR calc non Af Amer: 51 mL/min/{1.73_m2} — ABNORMAL LOW (ref >59)
GFR, EST AFRICAN AMERICAN: 58 mL/min/{1.73_m2} — AB (ref >59)
GLOBULIN, TOTAL: 2.2 g/dL (ref 1.5–4.5)
Glucose: 93 mg/dL (ref 65–99)
Potassium: 4.1 mmol/L (ref 3.5–5.2)
Sodium: 144 mmol/L (ref 134–144)
TOTAL PROTEIN: 6.5 g/dL (ref 6.0–8.5)

## 2017-01-13 LAB — LIPID PANEL
CHOL/HDL RATIO: 4.2 ratio (ref 0.0–4.4)
Cholesterol, Total: 216 mg/dL — ABNORMAL HIGH (ref 100–199)
HDL: 51 mg/dL (ref >39)
LDL Calculated: 142 mg/dL — ABNORMAL HIGH (ref 0–99)
Triglycerides: 116 mg/dL (ref 0–149)
VLDL Cholesterol Cal: 23 mg/dL (ref 5–40)

## 2017-01-20 ENCOUNTER — Telehealth: Payer: Self-pay | Admitting: Nurse Practitioner

## 2017-01-20 NOTE — Telephone Encounter (Signed)
Spoke with pt to confirm she is taking Metformin Pt states med was filled last week Left message on case manager's phone

## 2017-04-14 ENCOUNTER — Encounter: Payer: Self-pay | Admitting: Nurse Practitioner

## 2017-04-14 ENCOUNTER — Ambulatory Visit (INDEPENDENT_AMBULATORY_CARE_PROVIDER_SITE_OTHER): Payer: Medicare HMO | Admitting: Nurse Practitioner

## 2017-04-14 VITALS — BP 122/69 | HR 75 | Temp 96.8°F | Ht 61.0 in | Wt 202.0 lb

## 2017-04-14 DIAGNOSIS — Z6834 Body mass index (BMI) 34.0-34.9, adult: Secondary | ICD-10-CM | POA: Diagnosis not present

## 2017-04-14 DIAGNOSIS — E119 Type 2 diabetes mellitus without complications: Secondary | ICD-10-CM

## 2017-04-14 DIAGNOSIS — F3341 Major depressive disorder, recurrent, in partial remission: Secondary | ICD-10-CM | POA: Diagnosis not present

## 2017-04-14 DIAGNOSIS — R69 Illness, unspecified: Secondary | ICD-10-CM | POA: Diagnosis not present

## 2017-04-14 DIAGNOSIS — I10 Essential (primary) hypertension: Secondary | ICD-10-CM

## 2017-04-14 DIAGNOSIS — M06012 Rheumatoid arthritis without rheumatoid factor, left shoulder: Secondary | ICD-10-CM

## 2017-04-14 DIAGNOSIS — E038 Other specified hypothyroidism: Secondary | ICD-10-CM | POA: Diagnosis not present

## 2017-04-14 DIAGNOSIS — E782 Mixed hyperlipidemia: Secondary | ICD-10-CM

## 2017-04-14 DIAGNOSIS — M8588 Other specified disorders of bone density and structure, other site: Secondary | ICD-10-CM

## 2017-04-14 DIAGNOSIS — R609 Edema, unspecified: Secondary | ICD-10-CM | POA: Diagnosis not present

## 2017-04-14 DIAGNOSIS — M06011 Rheumatoid arthritis without rheumatoid factor, right shoulder: Secondary | ICD-10-CM | POA: Diagnosis not present

## 2017-04-14 DIAGNOSIS — Z1231 Encounter for screening mammogram for malignant neoplasm of breast: Secondary | ICD-10-CM | POA: Diagnosis not present

## 2017-04-14 DIAGNOSIS — F411 Generalized anxiety disorder: Secondary | ICD-10-CM

## 2017-04-14 LAB — BAYER DCA HB A1C WAIVED: HB A1C: 5.8 % (ref <7.0)

## 2017-04-14 MED ORDER — CLONAZEPAM 0.5 MG PO TABS: 0.5000 mg | ORAL_TABLET | Freq: Two times a day (BID) | ORAL | 2 refills | Status: DC | PRN

## 2017-04-14 MED ORDER — GLUCOSE BLOOD VI STRP: ORAL_STRIP | 11 refills | Status: DC

## 2017-04-14 MED ORDER — ONETOUCH ULTRASOFT LANCETS MISC: 11 refills | Status: DC

## 2017-04-14 MED ORDER — CELECOXIB 200 MG PO CAPS: 200.0000 mg | ORAL_CAPSULE | Freq: Two times a day (BID) | ORAL | 2 refills | Status: DC

## 2017-04-14 NOTE — Progress Notes (Signed)
Subjective:    Patient ID: Veronica Paul, female    DOB: [birth year], 82 y.o.   MRN: [id number]  HPI  Veronica Paul is here today for follow up of chronic medical problem.  Outpatient Encounter Medications as of 04/14/2017  Medication Sig  . amLODipine (NORVASC) 5 MG tablet Take 1 tablet (5 mg total) by mouth daily.  Marland Kitchen aspirin EC 81 MG EC tablet Take 81 mg by mouth daily.    Marland Kitchen BLACK COHOSH PO Take by mouth.    . celecoxib (CELEBREX) 200 MG capsule Take 1 capsule (200 mg total) by mouth 2 (two) times daily.  . clonazePAM (KLONOPIN) 0.5 MG tablet Take 1 tablet (0.5 mg total) by mouth 2 (two) times daily as needed.  . fish oil-omega-3 fatty acids 1000 MG capsule Take 2 g by mouth daily.    . fluticasone (FLONASE) 50 MCG/ACT nasal spray Place 2 sprays into both nostrils daily.  . furosemide (LASIX) 40 MG tablet Take 1 tablet (40 mg total) by mouth daily.  Marland Kitchen glucose blood test strip Check BS BID and PRN . DX.E11.9  . Lancets (ONETOUCH ULTRASOFT) lancets Check BS BID and PRN. DX. E11.9  . levothyroxine (SYNTHROID, LEVOTHROID) 88 MCG tablet TAKE 1 TABLET BY MOUTH EVERY DAY BEFORE BREAKFAST  . losartan (COZAAR) 100 MG tablet Take 1 tablet (100 mg total) by mouth daily.  . metFORMIN (GLUCOPHAGE) 500 MG tablet Take 1 tablet (500 mg total) by mouth daily.  . metoprolol (TOPROL-XL) 200 MG 24 hr tablet TAKE 1 AND 1/2 TABLET DAILY  . nitroGLYCERIN (NITROSTAT) 0.4 MG SL tablet Place 1 tablet (0.4 mg total) under the tongue every 5 (five) minutes as needed for chest pain.  Marland Kitchen nystatin cream (MYCOSTATIN) Apply 1 application topically 2 (two) times daily.     1. Hypertension, unspecified type  No c/o chest pain, sob or headache. Does nor check blood pressure at home BP Readings from Last 3 Encounters:  01/12/17 124/73  10/02/16 136/67  07/15/16 127/64     2. Diabetes mellitus without complication (Veronica Paul)  Last hgba1c was 6.1%. Patient checks her blood sugar every morning and ususlly runs le than  110.  3. Other specified hypothyroidism  She is not having any problems that she is aware of.   4. Osteopenia of other site  Denies any back pain  5. BMI 34.0-34.9,adult  No recent weight changes  6. Recurrent major depressive disorder, in partial remission (Veronica Paul)  She is not on anti depressant, but says she is doing well considering! Depression screen Veronica Paul 2/9 04/14/2017 01/12/2017 10/02/2016  Decreased Interest 0 0 0  Down, Depressed, Hopeless 0 0 0  PHQ - 2 Score 0 0 0  Altered sleeping - - -  Tired, decreased energy - - -  Change in appetite - - -  Feeling bad or failure about yourself  - - -  Trouble concentrating - - -  Moving slowly or fidgety/restless - - -  Suicidal thoughts - - -  PHQ-9 Score - - -     7. GAD (generalized anxiety disorder)  She is on klonopin BID. Says she becomes very anxious if she does not take, she has been taking this for many years.  8. Mixed hyperlipidemia  She admits to not really watching diet  9. Peripheral edema  Has swelling daily. Will resolve some with elevation  10.    arthritis          Currently hurting in both arms. Was  on celebrex which helped but she has            been taking it for awhile   New complaints: None today  Social history: Lives alone. Friends and family check on her daily Her sister died with cancer in 2022-09-01 and she has another Insurance risk surveyor that has been diagnosed with cancer. And her grandson killed hisself the day after christmas.    Review of Systems  Constitutional: Negative for activity change and appetite change.  HENT: Negative.   Eyes: Negative for pain.  Respiratory: Negative for shortness of breath.   Cardiovascular: Negative for chest pain, palpitations and leg swelling.  Gastrointestinal: Negative for abdominal pain.  Endocrine: Negative for polydipsia.  Genitourinary: Negative.   Skin: Negative for rash.  Neurological: Negative for dizziness, weakness and headaches.  Hematological: Does not bruise/bleed  easily.  Psychiatric/Behavioral: Negative.   All other systems reviewed and are negative.      Objective:   Physical Exam  Constitutional: She is oriented to person, place, and time. She appears well-developed and well-nourished.  HENT:  Nose: Nose normal.  Mouth/Throat: Oropharynx is clear and moist.  Eyes: EOM are normal.  Neck: Trachea normal, normal range of motion and full passive range of motion without pain. Neck supple. No JVD present. Carotid bruit is not present. No thyromegaly present.  Cardiovascular: Normal rate, regular rhythm, normal heart sounds and intact distal pulses. Exam reveals no gallop and no friction rub.  No murmur heard. Pulmonary/Chest: Effort normal and breath sounds normal.  Abdominal: Soft. Bowel sounds are normal. She exhibits no distension and no mass. There is no tenderness.  Musculoskeletal: Normal range of motion.  Lymphadenopathy:    She has no cervical adenopathy.  Neurological: She is alert and oriented to person, place, and time. She has normal reflexes.  Skin: Skin is warm and dry.  Psychiatric: She has a normal mood and affect. Her behavior is normal. Judgment and thought content normal.   BP 122/69   Pulse 75   Temp (!) 96.8 F (36 C) (Oral)   Ht _0  (1.549 m)   Wt 202 lb (91.6 kg)   LMP 05/11/1992   BMI 38.17 kg/m   HGBA1c 5.8%      Assessment & Plan:  1. Hypertension, unspecified type Low sodium diet - CMP14+EGFR  2. Diabetes mellitus without complication ([facility]) Continue to watch carbsin diet - Bayer DCA Hb A1c Waived - glucose blood test strip; Check BS BID and PRN . DX.E11.9  Dispense: 100 each; Refill: 11 - Lancets (ONETOUCH ULTRASOFT) lancets; Check BS BID and PRN. DX. E11.9  Dispense: 100 each; Refill: 11  3. Other specified hypothyroidism - Thyroid Panel With TSH  4. Osteopenia of other site Weight bearing exercise encourged.  5. BMI 34.0-34.9,adult Discussed diet and exercise for person with BMI >25 Will  recheck weight in 3-6 months  6. Recurrent major depressive disorder, in partial remission (Veronica Paul)  7. GAD (generalized anxiety disorder) Stress manaegment - clonazePAM (KLONOPIN) 0.5 MG tablet; Take 1 tablet (0.5 mg total) by mouth 2 (two) times daily as needed.  Dispense: 60 tablet; Refill: 2  8. Mixed hyperlipidemia Low fat diet - Lipid panel  9. Peripheral edema Elevate legs when sitting compression socks  10. Rheumatoid arthritis involving both shoulders with negative rheumatoid factor ([facility]) Ice areas if helps Try taking celebrex just 1x a day if can - celecoxib (CELEBREX) 200 MG capsule; Take 1 capsule (200 mg total) by mouth 2 (two) times daily.  Dispense: 60 capsule; Refill: 2   Has eye exam scheduled Labs pending Health maintenance reviewed Diet and exercise encouraged Continue all meds Follow up  In 3 months  [place], FNP

## 2017-04-14 NOTE — Patient Instructions (Signed)

## 2017-04-15 LAB — THYROID PANEL WITH TSH
Free Thyroxine Index: 3.5 (ref 1.2–4.9)
T3 Uptake Ratio: 32 % (ref 24–39)
T4, Total: 10.8 ug/dL (ref 4.5–12.0)
TSH: 0.419 u[IU]/mL — AB (ref 0.450–4.500)

## 2017-04-15 LAB — LIPID PANEL
Chol/HDL Ratio: 4.5 ratio — ABNORMAL HIGH (ref 0.0–4.4)
Cholesterol, Total: 215 mg/dL — ABNORMAL HIGH (ref 100–199)
HDL: 48 mg/dL (ref >39)
LDL CALC: 137 mg/dL — AB (ref 0–99)
Triglycerides: 149 mg/dL (ref 0–149)
VLDL CHOLESTEROL CAL: 30 mg/dL (ref 5–40)

## 2017-04-15 LAB — CMP14+EGFR
ALBUMIN: 4.1 g/dL (ref 3.5–4.8)
ALT: 15 IU/L (ref 0–32)
AST: 16 IU/L (ref 0–40)
Albumin/Globulin Ratio: 1.6 (ref 1.2–2.2)
Alkaline Phosphatase: 66 IU/L (ref 39–117)
BUN / CREAT RATIO: 13 (ref 12–28)
BUN: 14 mg/dL (ref 8–27)
Bilirubin Total: 0.4 mg/dL (ref 0.0–1.2)
CALCIUM: 9.2 mg/dL (ref 8.7–10.3)
CO2: 27 mmol/L (ref 20–29)
CREATININE: 1.11 mg/dL — AB (ref 0.57–1.00)
Chloride: 101 mmol/L (ref 96–106)
GFR calc non Af Amer: 47 mL/min/{1.73_m2} — ABNORMAL LOW (ref >59)
GFR, EST AFRICAN AMERICAN: 55 mL/min/{1.73_m2} — AB (ref >59)
Globulin, Total: 2.6 g/dL (ref 1.5–4.5)
Glucose: 104 mg/dL — ABNORMAL HIGH (ref 65–99)
Potassium: 4.4 mmol/L (ref 3.5–5.2)
Sodium: 144 mmol/L (ref 134–144)
TOTAL PROTEIN: 6.7 g/dL (ref 6.0–8.5)

## 2017-04-16 LAB — HM MAMMOGRAPHY

## 2017-04-24 ENCOUNTER — Encounter: Payer: Self-pay | Admitting: Nurse Practitioner

## 2017-04-24 ENCOUNTER — Ambulatory Visit (INDEPENDENT_AMBULATORY_CARE_PROVIDER_SITE_OTHER): Payer: Medicare HMO | Admitting: Nurse Practitioner

## 2017-04-24 VITALS — BP 113/71 | HR 75 | Temp 97.2°F | Ht 61.0 in | Wt 203.0 lb

## 2017-04-24 DIAGNOSIS — J0110 Acute frontal sinusitis, unspecified: Secondary | ICD-10-CM

## 2017-04-24 DIAGNOSIS — J0100 Acute maxillary sinusitis, unspecified: Secondary | ICD-10-CM

## 2017-04-24 MED ORDER — HYDROCODONE-HOMATROPINE 5-1.5 MG/5ML PO SYRP: 5.0000 mL | ORAL_SOLUTION | Freq: Four times a day (QID) | ORAL | 0 refills | Status: DC | PRN

## 2017-04-24 MED ORDER — AMOXICILLIN-POT CLAVULANATE 875-125 MG PO TABS: 1.0000 | ORAL_TABLET | Freq: Two times a day (BID) | ORAL | 0 refills | Status: DC

## 2017-04-24 NOTE — Progress Notes (Signed)
   Subjective:    Patient ID: Veronica Paul, female    DOB: 27-Sep-1935, 82 y.o.   MRN: [id number]  HPI  Patient comes in today c/o cough and congestion. Having lots of facial pressure. startted about 3 day sago and is much worse today.   Review of Systems  Constitutional: Positive for fever (101 last night). Negative for appetite change and chills.  HENT: Positive for congestion, postnasal drip, rhinorrhea, sinus pressure and sinus pain. Negative for sore throat and trouble swallowing.   Respiratory: Positive for cough (nonproductive).   Cardiovascular: Negative.   Gastrointestinal: Negative.   Neurological: Positive for headaches (slight).  Psychiatric/Behavioral: Negative.   All other systems reviewed and are negative.      Objective:   Physical Exam  Constitutional: She is oriented to person, place, and time. She appears well-developed and well-nourished. No distress.  HENT:  Right Ear: Hearing, tympanic membrane, external ear and ear canal normal.  Left Ear: Hearing, tympanic membrane, external ear and ear canal normal.  Nose: Mucosal edema and rhinorrhea present. Right sinus exhibits maxillary sinus tenderness and frontal sinus tenderness. Left sinus exhibits maxillary sinus tenderness and frontal sinus tenderness.  Mouth/Throat: Uvula is midline and oropharynx is clear and moist.  Neck: Normal range of motion. Neck supple.  Cardiovascular: Normal rate and regular rhythm.  Pulmonary/Chest: Effort normal and breath sounds normal.  Abdominal: Soft. Bowel sounds are normal.  Lymphadenopathy:    She has no cervical adenopathy.  Neurological: She is alert and oriented to person, place, and time.  Skin: Skin is warm.  Psychiatric: She has a normal mood and affect. Her behavior is normal. Judgment and thought content normal.   BP 113/71   Pulse 75   Temp (!) 97.2 F (36.2 C) (Oral)   Ht 5\' 1"  (1.549 m)   Wt 203 lb (92.1 kg)   LMP 05/11/1992   BMI 38.36 kg/m           Assessment & Plan:   1. Acute maxillary sinusitis, recurrence not specified   2. Acute frontal sinusitis, recurrence not specified    Meds ordered this encounter  Medications  . amoxicillin-clavulanate (AUGMENTIN) 875-125 MG tablet    Sig: Take 1 tablet by mouth 2 (two) times daily.    Dispense:  14 tablet    Refill:  0    Order Specific Question:   Supervising Provider    Answer:   VINCENT, CAROL L [[id number]]   1. Take meds as prescribed 2. Use a cool mist humidifier especially during the winter months and when heat has been humid. 3. Use saline nose sprays frequently 4. Saline irrigations of the nose can be very helpful if done frequently.  * 4X daily for 1 week*  * Use of a nettie pot can be helpful with this. Follow directions with this* 5. Drink plenty of fluids 6. Keep thermostat turn down low 7.For any cough or congestion  Use plain Mucinex- regular strength or max strength is fine   * Children- consult with Pharmacist for dosing 8. For fever or aces or pains- take tylenol or ibuprofen appropriate for age and weight.  * for fevers greater than 101 orally you may alternate ibuprofen and tylenol every  3 hours.   [name], FNP

## 2017-04-24 NOTE — Addendum Note (Signed)
Addended by: Chevis Pretty on: 04/24/2017 09:17 AM   Modules accepted: Orders

## 2017-04-24 NOTE — Patient Instructions (Signed)

## 2017-04-30 DIAGNOSIS — R69 Illness, unspecified: Secondary | ICD-10-CM | POA: Diagnosis not present

## 2017-05-01 ENCOUNTER — Telehealth: Payer: Self-pay | Admitting: Nurse Practitioner

## 2017-05-01 NOTE — Telephone Encounter (Signed)
Pt notified of recommendation Verbalizes understanding 

## 2017-05-01 NOTE — Telephone Encounter (Signed)
The cough will last at least 2 weks- just need to treat with cough meds. Another antibiotic will not help

## 2017-05-15 ENCOUNTER — Other Ambulatory Visit: Payer: Self-pay | Admitting: *Deleted

## 2017-05-15 DIAGNOSIS — I10 Essential (primary) hypertension: Secondary | ICD-10-CM

## 2017-05-15 MED ORDER — NITROGLYCERIN 0.4 MG SL SUBL: 0.4000 mg | SUBLINGUAL_TABLET | SUBLINGUAL | 0 refills | Status: DC | PRN

## 2017-07-02 ENCOUNTER — Other Ambulatory Visit: Payer: Self-pay | Admitting: Nurse Practitioner

## 2017-07-10 DIAGNOSIS — R69 Illness, unspecified: Secondary | ICD-10-CM | POA: Diagnosis not present

## 2017-07-16 ENCOUNTER — Ambulatory Visit (INDEPENDENT_AMBULATORY_CARE_PROVIDER_SITE_OTHER): Payer: Medicare HMO | Admitting: *Deleted

## 2017-07-16 ENCOUNTER — Encounter: Payer: Self-pay | Admitting: *Deleted

## 2017-07-16 ENCOUNTER — Ambulatory Visit (INDEPENDENT_AMBULATORY_CARE_PROVIDER_SITE_OTHER): Payer: Medicare HMO

## 2017-07-16 VITALS — BP 126/62 | HR 71 | Ht 61.25 in | Wt 203.0 lb

## 2017-07-16 DIAGNOSIS — M8580 Other specified disorders of bone density and structure, unspecified site: Secondary | ICD-10-CM

## 2017-07-16 DIAGNOSIS — Z780 Asymptomatic menopausal state: Secondary | ICD-10-CM | POA: Diagnosis not present

## 2017-07-16 DIAGNOSIS — Z0000 Encounter for general adult medical examination without abnormal findings: Secondary | ICD-10-CM | POA: Diagnosis not present

## 2017-07-16 DIAGNOSIS — M8588 Other specified disorders of bone density and structure, other site: Secondary | ICD-10-CM

## 2017-07-16 NOTE — Progress Notes (Addendum)
Subjective:   Veronica Paul is a 82 y.o. female who presents for a Medicare Annual Wellness Visit. Veronica Paul lives at home alone. Her husband passed away in 05/05/98.  She has 4 children, 8 grandchildren, and 8 great grandchildren. Her daughters help with the yard work. She keeps her 31 year old great grandaughter while school is out during the summer. She enjoys reading.    Review of Systems    Patient reports that her health is unchanged compared to last year.  Cardiac Risk Factors include: advanced age (>6men, >55 women);diabetes mellitus;dyslipidemia;hypertension;sedentary lifestyle;obesity (BMI >30kg/m2)   Other systems negative today or addressed in other area.        Current Medications (verified) Outpatient Encounter Medications as of 07/16/2017  Medication Sig  . amLODipine (NORVASC) 5 MG tablet Take 1 tablet (5 mg total) by mouth daily.  Marland Kitchen aspirin EC 81 MG EC tablet Take 81 mg by mouth daily.    Marland Kitchen BLACK COHOSH PO Take by mouth.    . clonazePAM (KLONOPIN) 0.5 MG tablet Take 1 tablet (0.5 mg total) by mouth 2 (two) times daily as needed.  . fish oil-omega-3 fatty acids 1000 MG capsule Take 2 g by mouth daily.    . fluticasone (FLONASE) 50 MCG/ACT nasal spray Place 2 sprays into both nostrils daily.  . furosemide (LASIX) 40 MG tablet Take 1 tablet (40 mg total) by mouth daily.  Marland Kitchen glucose blood test strip Check BS BID and PRN . DX.E11.9  . Lancets (ONETOUCH ULTRASOFT) lancets Check BS BID and PRN. DX. E11.9  . levothyroxine (SYNTHROID, LEVOTHROID) 88 MCG tablet TAKE 1 TABLET BY MOUTH EVERY DAY BEFORE BREAKFAST  . losartan (COZAAR) 100 MG tablet Take 1 tablet (100 mg total) by mouth daily.  . metFORMIN (GLUCOPHAGE) 500 MG tablet TAKE 1 TABLET BY MOUTH EVERY DAY  . metoprolol (TOPROL-XL) 200 MG 24 hr tablet TAKE 1 AND 1/2 TABLET DAILY  . nitroGLYCERIN (NITROSTAT) 0.4 MG SL tablet Place 1 tablet (0.4 mg total) under the tongue every 5 (five) minutes as needed for chest pain.  Marland Kitchen  nystatin cream (MYCOSTATIN) Apply 1 application topically 2 (two) times daily.  Marland Kitchen amoxicillin-clavulanate (AUGMENTIN) 875-125 MG tablet Take 1 tablet by mouth 2 (two) times daily.  . celecoxib (CELEBREX) 200 MG capsule Take 1 capsule (200 mg total) by mouth 2 (two) times daily.  Marland Kitchen HYDROcodone-homatropine (HYCODAN) 5-1.5 MG/5ML syrup Take 5 mLs by mouth every 6 (six) hours as needed for cough.   No facility-administered encounter medications on file as of 07/16/2017.     Allergies (verified) Ace inhibitors; Feldene [piroxicam]; Meloxicam; Prevnar [pneumococcal 13-val conj vacc]; Statins; Sulfa antibiotics; and Zithromax [azithromycin dihydrate]   History: Past Medical History:  Diagnosis Date  . Depression   . Diabetes mellitus   . Dyslipidemia   . HTN (hypertension)   . Hypothyroidism   . Lung cancer ([place])   . Obesity   . Osteopenia   . Osteopenia   . Vitamin D deficiency    Past Surgical History:  Procedure Laterality Date  . LUNG REMOVAL, PARTIAL  1998   Right  . THYROIDECTOMY     Family History  Problem Relation Age of Onset  . Coronary artery disease Brother 45  . Hypertension Brother   . Heart disease Brother   . Hypertension Mother   . Hypertension Father   . Heart disease Father   . Hypertension Sister   . Heart disease Sister   . Arthritis Daughter   .  COPD Daughter   . Fibromyalgia Daughter   . Stroke Maternal Grandmother   . Cancer Brother   . Hypertension Sister   . Colon cancer Neg Hx   . Food intolerance Neg Hx    Social History   Socioeconomic History  . Marital status: Widowed    Spouse name: Not on file  . Number of children: 4  . Years of education: 65  . Highest education level: 12th grade  Occupational History  . Occupation: Retired    Comment: Interior and spatial designer  . Financial resource strain: Not hard at all  . Food insecurity:    Worry: Never true    Inability: Never true  . Transportation needs:    Medical: No     Non-medical: No  Tobacco Use  . Smoking status: Never Smoker  . Smokeless tobacco: Never Used  Substance and Sexual Activity  . Alcohol use: No  . Drug use: No  . Sexual activity: Not Currently  Lifestyle  . Physical activity:    Days per week: 3 days    Minutes per session: 30 min  . Stress: Rather much  Relationships  . Social connections:    Talks on phone: More than three times a week    Gets together: More than three times a week    Attends religious service: More than 4 times per year    Active member of club or organization: Yes    Attends meetings of clubs or organizations: More than 4 times per year    Relationship status: Widowed  Other Topics Concern  . Not on file  Social History Narrative  . Not on file    Tobacco Use No.  Clinical Intake:     Pain : 0-10 Pain Score: 6  Pain Type: Chronic pain Pain Location: Shoulder Pain Orientation: Right, Left Pain Descriptors / Indicators: Aching Pain Onset: More than a month ago Pain Frequency: Constant Effect of Pain on Daily Activities: moderate-some days are worse than others     Nutritional Status: BMI > 30  Obese Diabetes: Yes CBG done?: No Did pt. bring in CBG monitor from home?: No  How often do you need to have someone help you when you read instructions, pamphlets, or other written materials from your doctor or pharmacy?: 5 - Always What is the last grade level you completed in school?: graduated high school     Information entered by :: Chong Sicilian, RN   Activities of Daily Living In your present state of health, do you have any difficulty performing the following activities: 07/16/2017  Hearing? N  Vision? N  Comment has yearly eye exam  Difficulty concentrating or making decisions? N  Walking or climbing stairs? N  Dressing or bathing? N  Doing errands, shopping? N  Preparing Food and eating ? N  Using the Toilet? N  In the past six months, have you accidently leaked urine? N  Do you  have problems with loss of bowel control? N  Managing your Medications? N  Managing your Finances? N  Housekeeping or managing your Housekeeping? N  Some recent data might be hidden     Immunizations and Health Maintenance Immunization History  Administered Date(s) Administered  . Influenza Split 01/17/2010  . Influenza Whole 11/07/2011  . Influenza, High Dose Seasonal PF 12/14/2015, 12/01/2016  . Influenza,inj,Quad PF,6+ Mos 11/08/2012, 12/02/2013, 12/28/2014  . Pneumococcal Conjugate-13 05/23/2013  . Pneumococcal Polysaccharide-23 12/19/2007  . Td 02/18/2003  . Tdap 11/04/2010  .  Zoster 08/13/2011   Health Maintenance Due  Topic Date Due  . OPHTHALMOLOGY EXAM  10/18/2016    Diet 2 meals a day  Exercise Current Exercise Habits: Home exercise routine, Type of exercise: walking, Time (Minutes): 30, Frequency (Times/Week): 3, Weekly Exercise (Minutes/Week): 90, Intensity: Mild, Exercise limited by: None identified   Depression Screen PHQ 2/9 Scores 07/16/2017 04/24/2017 04/14/2017 01/12/2017 10/02/2016 07/15/2016 06/23/2016  PHQ - 2 Score 0 0 0 0 0 0 0  PHQ- 9 Score - - - - - - -  Exception Documentation - - - - - - -     Fall Risk Fall Risk  07/16/2017 04/24/2017 04/14/2017 01/12/2017 10/02/2016  Falls in the past year? No No No No No    Is the patient's home free of loose throw rugs in walkways, pet beds, electrical cords, etc?   yes      Grab bars in the bathroom? no      Walkin shower? yes      Shower Seat? no      Handrails on the stairs?   yes      Adequate lighting?   yes  Patient Care Team: Chevis Pretty, FNP as PCP - General (Family Medicine) Minus Breeding, MD as Consulting Physician (Cardiology)   No hospitalizations, ER visits, or surgeries this past year.  Objective:    Today's Vitals   07/16/17 1018 07/16/17 1021  BP: 126/62   Pulse: 71   Weight: 203 lb (92.1 kg)   Height: 5' 1.25" (1.556 m)   PainSc:  6    Body mass index is 38.04  kg/m.  Advanced Directives 07/16/2017 07/15/2016  Does Patient Have a Medical Advance Directive? No Yes  Type of Advance Directive - [place];Living will;Out of facility DNR (pink MOST or yellow form)  Copy of [place] in Chart? - No - copy requested  Would patient like information on creating a medical advance directive? Yes (MAU/Ambulatory/Procedural Areas - Information given) -    Hearing/Vision  normal or No deficits noted during visit.  Cognitive Function: MMSE - Mini Mental State Exam 07/16/2017 07/15/2016  Orientation to time 5 5  Orientation to Place 5 5  Registration 3 3  Attention/ Calculation 5 5  Recall 2 2  Language- name 2 objects 2 2  Language- repeat 1 1  Language- follow 3 step command 3 3  Language- read & follow direction 1 1  Write a sentence 1 1  Copy design 0 1  Total score 28 29       Normal Cognitive Function Screening: Yes      Assessment:   This is a routine wellness examination for Kadin.    Plan:    Goals    . Exercise 3x per week (30 min per time)     Increase exercise to 30 min 3 times weekly Treadmill Seated Strengthening Exercise (Handout)       Keep f/u with Chevis Pretty, FNP and any other specialty appointments you may have Continue current medications Move carefully to avoid falls. Use assistive devices like a can or walker if needed. Aim for at least 150 minutes of moderate activity a week. This can be done with chair exercises if necessary. Read or work on puzzles daily Stay connected with friends and family  Review and return a signed, witnessed, and notarized copy of Advance Directives if given.  Health Maintenance: Tdap Vaccine recommended: no Zostavax (Shingles vaccine) recommended:no Prevnar or Pneumovax (pneumonia vaccines)  recommended:no  Cancer Screenings: Lung: Low Dose CT Chest recommended if Age 62-80 years, 30 pack-year currently smoking OR have quit w/in  15years. Patient does not qualify. Colon cancer screening recommended: not applicable Mammogram recommended:not applicable Pap Smear recommended: not applicable  Additional Screenings Hepatitis C Screening recommended: not applicable Dexa Scan recommended: yes Diabetic Eye Exam recommended: yes  Orders Placed This Encounter  Procedures  . [facility] MAMMOGRAPHY    This external order was created through the Results Console.  Darletta Moll [facility] DEXA    Order Specific Question:   Reason for Exam (SYMPTOM  OR DIAGNOSIS REQUIRED)    Answer:   ostepenia and post menopausal    I have personally reviewed and noted the following in the patient's chart:   . Medical and social history . Use of alcohol, tobacco or illicit drugs  . Current medications and supplements . Functional ability and status . Nutritional status . Physical activity . Advanced directives . List of other physicians . Hospitalizations, surgeries, and ER visits in previous 12 months . Vitals . Screenings to include cognitive, depression, and falls . Referrals and appointments  In addition, I have reviewed and discussed with patient certain preventive protocols, quality metrics, and best practice recommendations. A written personalized care plan for preventive services as well as general preventive health recommendations were provided to patient.     Chong Sicilian, RN   07/16/2017   I have reviewed and agree with the above AWV documentation.   [name], FNP

## 2017-07-16 NOTE — Patient Instructions (Addendum)
[name] , Thank you for taking time to come for your Medicare Wellness Visit. I appreciate your ongoing commitment to your health goals. Please review the following plan we discussed and let me know if I can assist you in the future.   These are the goals we discussed: Goals    . Exercise 3x per week (30 min per time)     Increase exercise to 30 min 3 times weekly Treadmill Seated Strengthening Exercise (Handout)       This is a list of the screening recommended for you and due dates:  Health Maintenance  Topic Date Due  . Eye exam for diabetics  10/18/2016  . Flu Shot  09/17/2017  . Hemoglobin A1C  10/12/2017  . Complete foot exam   04/14/2018  . Mammogram  04/17/2019  . Tetanus Vaccine  11/03/2020  . DEXA scan (bone density measurement)  Completed  . Pneumonia vaccines  Completed    Diabetes Mellitus and Nutrition When you have diabetes (diabetes mellitus), it is very important to have healthy eating habits because your blood sugar (glucose) levels are greatly affected by what you eat and drink. Eating healthy foods in the appropriate amounts, at about the same times every day, can help you:  Control your blood glucose.  Lower your risk of heart disease.  Improve your blood pressure.  Reach or maintain a healthy weight.  Every person with diabetes is different, and each person has different needs for a meal plan. Your health care provider may recommend that you work with a diet and nutrition specialist (dietitian) to make a meal plan that is best for you. Your meal plan may vary depending on factors such as:  The calories you need.  The medicines you take.  Your weight.  Your blood glucose, blood pressure, and cholesterol levels.  Your activity level.  Other health conditions you have, such as heart or kidney disease.  How do carbohydrates affect me? Carbohydrates affect your blood glucose level more than any other type of food. Eating carbohydrates  naturally increases the amount of glucose in your blood. Carbohydrate counting is a method for keeping track of how many carbohydrates you eat. Counting carbohydrates is important to keep your blood glucose at a healthy level, especially if you use insulin or take certain oral diabetes medicines. It is important to know how many carbohydrates you can safely have in each meal. This is different for every person. Your dietitian can help you calculate how many carbohydrates you should have at each meal and for snack. Foods that contain carbohydrates include:  Bread, cereal, rice, pasta, and crackers.  Potatoes and corn.  Peas, beans, and lentils.  Milk and yogurt.  Fruit and juice.  Desserts, such as cakes, cookies, ice cream, and candy.  How does alcohol affect me? Alcohol can cause a sudden decrease in blood glucose (hypoglycemia), especially if you use insulin or take certain oral diabetes medicines. Hypoglycemia can be a life-threatening condition. Symptoms of hypoglycemia (sleepiness, dizziness, and confusion) are similar to symptoms of having too much alcohol. If your health care provider says that alcohol is safe for you, follow these guidelines:  Limit alcohol intake to no more than 1 drink per day for nonpregnant women and 2 drinks per day for men. One drink equals 12 oz of beer, 5 oz of wine, or 1 oz of hard liquor.  Do not drink on an empty stomach.  Keep yourself hydrated with water, diet soda, or unsweetened iced tea.  Keep in mind that regular soda, juice, and other mixers may contain a lot of sugar and must be counted as carbohydrates.  What are tips for following this plan? Reading food labels  Start by checking the serving size on the label. The amount of calories, carbohydrates, fats, and other nutrients listed on the label are based on one serving of the food. Many foods contain more than one serving per package.  Check the total grams (g) of carbohydrates in one  serving. You can calculate the number of servings of carbohydrates in one serving by dividing the total carbohydrates by 15. For example, if a food has 30 g of total carbohydrates, it would be equal to 2 servings of carbohydrates.  Check the number of grams (g) of saturated and trans fats in one serving. Choose foods that have low or no amount of these fats.  Check the number of milligrams (mg) of sodium in one serving. Most people should limit total sodium intake to less than 2,300 mg per day.  Always check the nutrition information of foods labeled as "low-fat" or "nonfat". These foods may be higher in added sugar or refined carbohydrates and should be avoided.  Talk to your dietitian to identify your daily goals for nutrients listed on the label. Shopping  Avoid buying canned, premade, or processed foods. These foods tend to be high in fat, sodium, and added sugar.  Shop around the outside edge of the grocery store. This includes fresh fruits and vegetables, bulk grains, fresh meats, and fresh dairy. Cooking  Use low-heat cooking methods, such as baking, instead of high-heat cooking methods like deep frying.  Cook using healthy oils, such as olive, canola, or sunflower oil.  Avoid cooking with butter, cream, or high-fat meats. Meal planning  Eat meals and snacks regularly, preferably at the same times every day. Avoid going long periods of time without eating.  Eat foods high in fiber, such as fresh fruits, vegetables, beans, and whole grains. Talk to your dietitian about how many servings of carbohydrates you can eat at each meal.  Eat 4-6 ounces of lean protein each day, such as lean meat, chicken, fish, eggs, or tofu. 1 ounce is equal to 1 ounce of meat, chicken, or fish, 1 egg, or 1/4 cup of tofu.  Eat some foods each day that contain healthy fats, such as avocado, nuts, seeds, and fish. Lifestyle   Check your blood glucose regularly.  Exercise at least 30 minutes 5 or more  days each week, or as told by your health care provider.  Take medicines as told by your health care provider.  Do not use any products that contain nicotine or tobacco, such as cigarettes and e-cigarettes. If you need help quitting, ask your health care provider.  Work with a Social worker or diabetes educator to identify strategies to manage stress and any emotional and social challenges. What are some questions to ask my health care provider?  Do I need to meet with a diabetes educator?  Do I need to meet with a dietitian?  What number can I call if I have questions?  When are the best times to check my blood glucose? Where to find more information:  American Diabetes Association: diabetes.org/food-and-fitness/food  Academy of Nutrition and Dietetics: PokerClues.dk  [employer] of Diabetes and Digestive and Kidney Diseases (NIH): ContactWire.be Summary  A healthy meal plan will help you control your blood glucose and maintain a healthy lifestyle.  Working with a diet and nutrition specialist (  dietitian) can help you make a meal plan that is best for you.  Keep in mind that carbohydrates and alcohol have immediate effects on your blood glucose levels. It is important to count carbohydrates and to use alcohol carefully. This information is not intended to replace advice given to you by your health care provider. Make sure you discuss any questions you have with your health care provider. Document Released: 10/31/2004 Document Revised: 03/10/2016 Document Reviewed: 03/10/2016 Elsevier Interactive Patient Education  [employer].

## 2017-07-17 DIAGNOSIS — Z780 Asymptomatic menopausal state: Secondary | ICD-10-CM | POA: Diagnosis not present

## 2017-07-17 DIAGNOSIS — Z13820 Encounter for screening for osteoporosis: Secondary | ICD-10-CM | POA: Diagnosis not present

## 2017-07-31 ENCOUNTER — Ambulatory Visit (INDEPENDENT_AMBULATORY_CARE_PROVIDER_SITE_OTHER): Payer: Medicare HMO | Admitting: Nurse Practitioner

## 2017-07-31 ENCOUNTER — Encounter: Payer: Self-pay | Admitting: Nurse Practitioner

## 2017-07-31 VITALS — BP 138/72 | HR 70 | Temp 96.8°F | Ht 61.0 in | Wt 200.0 lb

## 2017-07-31 DIAGNOSIS — F3341 Major depressive disorder, recurrent, in partial remission: Secondary | ICD-10-CM

## 2017-07-31 DIAGNOSIS — N904 Leukoplakia of vulva: Secondary | ICD-10-CM | POA: Diagnosis not present

## 2017-07-31 DIAGNOSIS — I10 Essential (primary) hypertension: Secondary | ICD-10-CM

## 2017-07-31 DIAGNOSIS — E038 Other specified hypothyroidism: Secondary | ICD-10-CM

## 2017-07-31 DIAGNOSIS — M8588 Other specified disorders of bone density and structure, other site: Secondary | ICD-10-CM | POA: Diagnosis not present

## 2017-07-31 DIAGNOSIS — R609 Edema, unspecified: Secondary | ICD-10-CM | POA: Diagnosis not present

## 2017-07-31 DIAGNOSIS — E119 Type 2 diabetes mellitus without complications: Secondary | ICD-10-CM | POA: Diagnosis not present

## 2017-07-31 DIAGNOSIS — F411 Generalized anxiety disorder: Secondary | ICD-10-CM | POA: Diagnosis not present

## 2017-07-31 DIAGNOSIS — Z6834 Body mass index (BMI) 34.0-34.9, adult: Secondary | ICD-10-CM | POA: Diagnosis not present

## 2017-07-31 DIAGNOSIS — E782 Mixed hyperlipidemia: Secondary | ICD-10-CM

## 2017-07-31 DIAGNOSIS — R69 Illness, unspecified: Secondary | ICD-10-CM | POA: Diagnosis not present

## 2017-07-31 LAB — CMP14+EGFR
ALT: 15 IU/L (ref 0–32)
AST: 17 IU/L (ref 0–40)
Albumin/Globulin Ratio: 1.8 (ref 1.2–2.2)
Albumin: 4.3 g/dL (ref 3.5–4.8)
Alkaline Phosphatase: 71 IU/L (ref 39–117)
BUN/Creatinine Ratio: 16 (ref 12–28)
BUN: 14 mg/dL (ref 8–27)
Bilirubin Total: 0.4 mg/dL (ref 0.0–1.2)
CALCIUM: 9.1 mg/dL (ref 8.7–10.3)
CO2: 26 mmol/L (ref 20–29)
CREATININE: 0.89 mg/dL (ref 0.57–1.00)
Chloride: 100 mmol/L (ref 96–106)
GFR, EST AFRICAN AMERICAN: 71 mL/min/{1.73_m2} (ref >59)
GFR, EST NON AFRICAN AMERICAN: 62 mL/min/{1.73_m2} (ref >59)
GLUCOSE: 104 mg/dL — AB (ref 65–99)
Globulin, Total: 2.4 g/dL (ref 1.5–4.5)
Potassium: 4.1 mmol/L (ref 3.5–5.2)
Sodium: 144 mmol/L (ref 134–144)
TOTAL PROTEIN: 6.7 g/dL (ref 6.0–8.5)

## 2017-07-31 LAB — LIPID PANEL
CHOL/HDL RATIO: 5.2 ratio — AB (ref 0.0–4.4)
Cholesterol, Total: 219 mg/dL — ABNORMAL HIGH (ref 100–199)
HDL: 42 mg/dL (ref >39)
LDL Calculated: 141 mg/dL — ABNORMAL HIGH (ref 0–99)
Triglycerides: 181 mg/dL — ABNORMAL HIGH (ref 0–149)
VLDL CHOLESTEROL CAL: 36 mg/dL (ref 5–40)

## 2017-07-31 LAB — BAYER DCA HB A1C WAIVED: HB A1C: 5.6 % (ref <7.0)

## 2017-07-31 MED ORDER — LEVOTHYROXINE SODIUM 88 MCG PO TABS: ORAL_TABLET | ORAL | 1 refills | Status: DC

## 2017-07-31 MED ORDER — FUROSEMIDE 40 MG PO TABS: 40.0000 mg | ORAL_TABLET | Freq: Every day | ORAL | 1 refills | Status: DC

## 2017-07-31 MED ORDER — METFORMIN HCL 500 MG PO TABS: 500.0000 mg | ORAL_TABLET | Freq: Every day | ORAL | 1 refills | Status: DC

## 2017-07-31 MED ORDER — AMLODIPINE BESYLATE 5 MG PO TABS: 5.0000 mg | ORAL_TABLET | Freq: Every day | ORAL | 1 refills | Status: DC

## 2017-07-31 MED ORDER — CLONAZEPAM 0.5 MG PO TABS: 0.5000 mg | ORAL_TABLET | Freq: Two times a day (BID) | ORAL | 2 refills | Status: DC | PRN

## 2017-07-31 MED ORDER — METOPROLOL SUCCINATE ER 200 MG PO TB24: ORAL_TABLET | ORAL | 1 refills | Status: DC

## 2017-07-31 MED ORDER — TRIAMCINOLONE ACETONIDE 0.025 % EX OINT: 1.0000 "application " | TOPICAL_OINTMENT | Freq: Two times a day (BID) | CUTANEOUS | 3 refills | Status: DC

## 2017-07-31 MED ORDER — LOSARTAN POTASSIUM 100 MG PO TABS: 100.0000 mg | ORAL_TABLET | Freq: Every day | ORAL | 1 refills | Status: DC

## 2017-07-31 NOTE — Patient Instructions (Signed)
Lichen Sclerosus Lichen sclerosus is a skin problem. It can happen on any part of the body. It happens most often in the anal or genital areas. It can cause itching and discomfort. Treatment can help to control symptoms. This skin problem is not passed from one person to another (not contagious). The cause is not known. Follow these instructions at home:  Take over-the-counter and prescription medicines only as told by your doctor.  Use creams or ointments as told by your doctor.  Do not scratch the affected areas of skin.  Women should keep the vagina as clean and dry as they can.  Keep all follow-up visits as told by your doctor. This is important. Contact a doctor if:  Your redness, swelling, or pain gets worse.  You have fluid, blood, or pus coming from the area.  You have new patches (lesions) on your skin.  You have a fever.  You have pain during sex. This information is not intended to replace advice given to you by your health care provider. Make sure you discuss any questions you have with your health care provider. Document Released: 01/17/2008 Document Revised: 07/12/2015 Document Reviewed: 05/01/2014 Elsevier Interactive Patient Education  [employer].

## 2017-07-31 NOTE — Progress Notes (Signed)
Subjective:    Patient ID: Veronica Paul, female    DOB: [birth year], 82 y.o.   MRN: [id number]   Chief Complaint: Medical management of chronic issues  HPI:  1. Hypertension, unspecified type  No c/o chest pain, sob or headache. Does not check blood pressure at home. BP Readings from Last 3 Encounters:  07/16/17 126/62  04/24/17 113/71  04/14/17 122/69     2. Other specified hypothyroidism  She is not aware of any problems that she is having with her thyroid.  3. Diabetes mellitus without complication (HCC)  Last hgba1c was 5.8. She does not check her blood sugars very often  4. Osteopenia of other site  Last BMD test was done on 07/16/17 with t score of -0.6. She has no c/o back pain.  5. Peripheral edema  Has daily edema in evenings. Resolves some by morning.  6. Mixed hyperlipidemia  Not really watching diet. No exercise.  7. GAD (generalized anxiety disorder)  Takes klonopin daily which helps keep her from worrying.  8. Recurrent major depressive disorder, in partial remission ([place])  is currently not on antidepressant. Depression screen [facility] 2/9 07/31/2017 07/16/2017 04/24/2017  Decreased Interest 1 0 0  Down, Depressed, Hopeless 0 0 0  PHQ - 2 Score 1 0 0  Altered sleeping - - -  Tired, decreased energy - - -  Change in appetite - - -  Feeling bad or failure about yourself  - - -  Trouble concentrating - - -  Moving slowly or fidgety/restless - - -  Suicidal thoughts - - -  PHQ-9 Score - - -  Some recent data might be hidden      9. BMI 34.0-34.9,adult  No recent weight changes.    Outpatient Encounter Medications as of 07/31/2017  Medication Sig  . amLODipine (NORVASC) 5 MG tablet Take 1 tablet (5 mg total) by mouth daily.  Marland Kitchen amoxicillin-clavulanate (AUGMENTIN) 875-125 MG tablet Take 1 tablet by mouth 2 (two) times daily.  Marland Kitchen aspirin EC 81 MG EC tablet Take 81 mg by mouth daily.    Marland Kitchen BLACK COHOSH PO Take by mouth.    . celecoxib (CELEBREX) 200 MG capsule Take  1 capsule (200 mg total) by mouth 2 (two) times daily.  . clonazePAM (KLONOPIN) 0.5 MG tablet Take 1 tablet (0.5 mg total) by mouth 2 (two) times daily as needed.  . fish oil-omega-3 fatty acids 1000 MG capsule Take 2 g by mouth daily.    . fluticasone (FLONASE) 50 MCG/ACT nasal spray Place 2 sprays into both nostrils daily.  . furosemide (LASIX) 40 MG tablet Take 1 tablet (40 mg total) by mouth daily.  Marland Kitchen glucose blood test strip Check BS BID and PRN . DX.E11.9  . HYDROcodone-homatropine (HYCODAN) 5-1.5 MG/5ML syrup Take 5 mLs by mouth every 6 (six) hours as needed for cough.  . Lancets (ONETOUCH ULTRASOFT) lancets Check BS BID and PRN. DX. E11.9  . levothyroxine (SYNTHROID, LEVOTHROID) 88 MCG tablet TAKE 1 TABLET BY MOUTH EVERY DAY BEFORE BREAKFAST  . losartan (COZAAR) 100 MG tablet Take 1 tablet (100 mg total) by mouth daily.  . metFORMIN (GLUCOPHAGE) 500 MG tablet TAKE 1 TABLET BY MOUTH EVERY DAY  . metoprolol (TOPROL-XL) 200 MG 24 hr tablet TAKE 1 AND 1/2 TABLET DAILY  . nitroGLYCERIN (NITROSTAT) 0.4 MG SL tablet Place 1 tablet (0.4 mg total) under the tongue every 5 (five) minutes as needed for chest pain.  Marland Kitchen nystatin cream (MYCOSTATIN) Apply 1 application  topically 2 (two) times daily.      New complaints: Still c/o perineal itching- she has been using nystatin cream which is not helping  Social history: Lives by herself. Family checks on her daily. She is currently keeping her great grandchild this summer.   Review of Systems  Constitutional: Negative for activity change and appetite change.  HENT: Negative.   Eyes: Negative for pain.  Respiratory: Negative for shortness of breath.   Cardiovascular: Negative for chest pain, palpitations and leg swelling.  Gastrointestinal: Negative for abdominal pain.  Endocrine: Negative for polydipsia.  Genitourinary: Negative.  Negative for vaginal bleeding, vaginal discharge and vaginal pain.  Musculoskeletal: Negative.   Skin: Negative  for rash.  Neurological: Negative for dizziness, weakness and headaches.  Hematological: Does not bruise/bleed easily.  Psychiatric/Behavioral: Negative.   All other systems reviewed and are negative.      Objective:   Physical Exam  Constitutional: She is oriented to person, place, and time. She appears well-developed and well-nourished.  HENT:  Head: Normocephalic.  Nose: Nose normal.  Mouth/Throat: Oropharynx is clear and moist.  Eyes: Pupils are equal, round, and reactive to light. EOM are normal.  Neck: Normal range of motion. Neck supple. No JVD present. Carotid bruit is not present.  Cardiovascular: Normal rate, regular rhythm, normal heart sounds and intact distal pulses.  Pulmonary/Chest: Effort normal and breath sounds normal. No respiratory distress. She has no wheezes. She has no rales. She exhibits no tenderness.  Abdominal: Soft. Normal appearance, normal aorta and bowel sounds are normal. She exhibits no distension, no abdominal bruit, no pulsatile midline mass and no mass. There is no splenomegaly or hepatomegaly. There is no tenderness.  Genitourinary: Vagina normal.  Genitourinary Comments: wite atrophic plaque around vulva  Musculoskeletal: Normal range of motion. She exhibits no edema.  Lymphadenopathy:    She has no cervical adenopathy.  Neurological: She is alert and oriented to person, place, and time. She has normal reflexes.  Skin: Skin is warm and dry.  Psychiatric: She has a normal mood and affect. Her behavior is normal. Judgment and thought content normal.  Nursing note and vitals reviewed.   BP 138/72   Pulse 70   Temp (!) 96.8 F (36 C) (Oral)   Ht '5\' 1"'  (1.549 m)   Wt 200 lb (90.7 kg)   LMP 05/11/1992   BMI 37.79 kg/m   hgba1c 5.6     Assessment & Plan:  Veronica Paul comes in today with chief complaint of Medical Management of Chronic Issues   Diagnosis and orders addressed:  1. Hypertension, essential Low sodium diet- amLODipine  (NORVASC) 5 MG tablet; Take 1 tablet (5 mg total) by mouth daily.  Dispense: 90 tablet; Refill: 1 - losartan (COZAAR) 100 MG tablet; Take 1 tablet (100 mg total) by mouth daily.  Dispense: 90 tablet; Refill: 1 - metoprolol (TOPROL-XL) 200 MG 24 hr tablet; TAKE 1 AND 1/2 TABLET DAILY  Dispense: 135 tablet; Refill: 1   - CMP14+EGFR  2. Other specified hypothyroidism - levothyroxine (SYNTHROID, LEVOTHROID) 88 MCG tablet; TAKE 1 TABLET BY MOUTH EVERY DAY BEFORE BREAKFAST  Dispense: 90 tablet; Refill: 1  3. Diabetes mellitus without complication ([place]) Continue to watch carbs in diet - Bayer DCA Hb A1c Waived - Microalbumin / creatinine urine ratio  4. Osteopenia of other site Weight bearing exercises encouraged  5. Peripheral edema Elevate legs when sitting - furosemide (LASIX) 40 MG tablet; Take 1 tablet (40 mg total) by mouth daily.  Dispense: 90 tablet; Refill: 1  6. Mixed hyperlipidemia Low fat diet - Lipid panel  7. GAD (generalized anxiety disorder) Stress management - clonazePAM (KLONOPIN) 0.5 MG tablet; Take 1 tablet (0.5 mg total) by mouth 2 (two) times daily as needed.  Dispense: 60 tablet; Refill: 2  8. Recurrent major depressive disorder, in partial remission ([place])  9. BMI 34.0-34.9,adult Discussed diet and exercise for person with BMI >25 Will recheck weight in 3-6 months   10 Lichen sclerosus of female genitalia Avoid hot baths Try not to scratch - triamcinolone (KENALOG) 0.025 % ointment; Apply 1 application topically 2 (two) times daily.  Dispense: 80 g; Refill: 3   Labs pending Health Maintenance reviewed Diet and exercise encouraged  Follow up plan: 3 months   [name], FNP

## 2017-09-26 ENCOUNTER — Other Ambulatory Visit: Payer: Self-pay | Admitting: Nurse Practitioner

## 2017-10-03 ENCOUNTER — Other Ambulatory Visit: Payer: Self-pay | Admitting: Nurse Practitioner

## 2017-10-03 DIAGNOSIS — I10 Essential (primary) hypertension: Secondary | ICD-10-CM

## 2017-10-07 ENCOUNTER — Other Ambulatory Visit: Payer: Self-pay | Admitting: Nurse Practitioner

## 2017-10-07 DIAGNOSIS — E038 Other specified hypothyroidism: Secondary | ICD-10-CM

## 2017-11-03 DIAGNOSIS — R69 Illness, unspecified: Secondary | ICD-10-CM | POA: Diagnosis not present

## 2017-11-05 ENCOUNTER — Encounter: Payer: Self-pay | Admitting: Nurse Practitioner

## 2017-11-05 ENCOUNTER — Ambulatory Visit (INDEPENDENT_AMBULATORY_CARE_PROVIDER_SITE_OTHER): Payer: Medicare HMO

## 2017-11-05 ENCOUNTER — Ambulatory Visit (INDEPENDENT_AMBULATORY_CARE_PROVIDER_SITE_OTHER): Payer: Medicare HMO | Admitting: Nurse Practitioner

## 2017-11-05 VITALS — BP 138/72 | HR 61 | Temp 98.2°F | Ht 61.0 in | Wt 198.0 lb

## 2017-11-05 DIAGNOSIS — R69 Illness, unspecified: Secondary | ICD-10-CM | POA: Diagnosis not present

## 2017-11-05 DIAGNOSIS — R609 Edema, unspecified: Secondary | ICD-10-CM

## 2017-11-05 DIAGNOSIS — F3341 Major depressive disorder, recurrent, in partial remission: Secondary | ICD-10-CM

## 2017-11-05 DIAGNOSIS — E782 Mixed hyperlipidemia: Secondary | ICD-10-CM | POA: Diagnosis not present

## 2017-11-05 DIAGNOSIS — I10 Essential (primary) hypertension: Secondary | ICD-10-CM

## 2017-11-05 DIAGNOSIS — E119 Type 2 diabetes mellitus without complications: Secondary | ICD-10-CM | POA: Diagnosis not present

## 2017-11-05 DIAGNOSIS — M8588 Other specified disorders of bone density and structure, other site: Secondary | ICD-10-CM | POA: Diagnosis not present

## 2017-11-05 DIAGNOSIS — F411 Generalized anxiety disorder: Secondary | ICD-10-CM

## 2017-11-05 DIAGNOSIS — E038 Other specified hypothyroidism: Secondary | ICD-10-CM

## 2017-11-05 LAB — BAYER DCA HB A1C WAIVED: HB A1C (BAYER DCA - WAIVED): 5.9 % (ref <7.0)

## 2017-11-05 MED ORDER — LEVOTHYROXINE SODIUM 88 MCG PO TABS: ORAL_TABLET | ORAL | 1 refills | Status: DC

## 2017-11-05 MED ORDER — METOPROLOL SUCCINATE ER 200 MG PO TB24: ORAL_TABLET | ORAL | 1 refills | Status: DC

## 2017-11-05 MED ORDER — METFORMIN HCL 500 MG PO TABS: 500.0000 mg | ORAL_TABLET | Freq: Every day | ORAL | 1 refills | Status: DC

## 2017-11-05 MED ORDER — LOSARTAN POTASSIUM 100 MG PO TABS: 100.0000 mg | ORAL_TABLET | Freq: Every day | ORAL | 1 refills | Status: DC

## 2017-11-05 MED ORDER — CLONAZEPAM 0.5 MG PO TABS: 0.5000 mg | ORAL_TABLET | Freq: Two times a day (BID) | ORAL | 2 refills | Status: DC | PRN

## 2017-11-05 MED ORDER — AMLODIPINE BESYLATE 5 MG PO TABS: 5.0000 mg | ORAL_TABLET | Freq: Every day | ORAL | 1 refills | Status: DC

## 2017-11-05 MED ORDER — FUROSEMIDE 40 MG PO TABS: 40.0000 mg | ORAL_TABLET | Freq: Every day | ORAL | 1 refills | Status: DC

## 2017-11-05 NOTE — Patient Instructions (Signed)

## 2017-11-05 NOTE — Progress Notes (Signed)
 Subjective:    Patient ID: Veronica Paul, female    DOB: 05/10/1935, 82 y.o.   MRN: [id number]   Chief Complaint: Medical management of chronic issues  HPI:  1. Hypertension, essential -does check BP at home -average 140/70 -no chest pain/shortness of breath/HA -low salt diet   2. Diabetes mellitus without complication (HCC)  -does check blood sugars every other day -average about 100 -does not watch diet -no issues with Metformin -saw eye MD in August 2019   3. Other specified hypothyroidism  -no complaints with hot/cold intolerance -takes levothyroxine   4. Osteopenia of other site  -does not do weight-bearing exercises -last BMD was 07/16/17 -does leg exercises 2-3 x/day   5. Mixed hyperlipidemia  -unable to tolerate statin due to myalgias -does not watch fat intake   6. Recurrent major depressive disorder, in partial remission (HCC)  -well controlled no depressive episodes -not currently on antidepressant    Office Visit from 11/05/2017 in [facility]  PHQ-9 Total Score  4       7. Peripheral edema  -lasix every day -does not use compression hose   8. GAD (generalized anxiety disorder)  -no new concerns, controlled with Klonopin -takes it every couple of days GAD 7 : Generalized Anxiety Score 11/05/2017 10/02/2016  Nervous, Anxious, on Edge 1 0  Control/stop worrying 3 0  Worry too much - different things 3 0  Trouble relaxing 0 0  Restless 0 0  Easily annoyed or irritable 0 0  Afraid - awful might happen 0 0  Total GAD 7 Score 7 0  Anxiety Difficulty Not difficult at all Not difficult at all       Outpatient Encounter Medications as of 11/05/2017  Medication Sig  . amLODipine (NORVASC) 5 MG tablet Take 1 tablet (5 mg total) by mouth daily.  . amLODipine (NORVASC) 5 MG tablet TAKE 1 TABLET BY MOUTH EVERY DAY  . aspirin EC 81 MG EC tablet Take 81 mg by mouth daily.    . BLACK COHOSH PO Take by mouth.    . clonazePAM  (KLONOPIN) 0.5 MG tablet Take 1 tablet (0.5 mg total) by mouth 2 (two) times daily as needed.  . fish oil-omega-3 fatty acids 1000 MG capsule Take 2 g by mouth daily.    . fluticasone (FLONASE) 50 MCG/ACT nasal spray Place 2 sprays into both nostrils daily.  . furosemide (LASIX) 40 MG tablet Take 1 tablet (40 mg total) by mouth daily.  . glucose blood test strip Check BS BID and PRN . DX.E11.9  . Lancets (ONETOUCH ULTRASOFT) lancets Check BS BID and PRN. DX. E11.9  . levothyroxine (SYNTHROID, LEVOTHROID) 88 MCG tablet TAKE 1 TABLET BY MOUTH EVERY DAY BEFORE BREAKFAST  . losartan (COZAAR) 100 MG tablet Take 1 tablet (100 mg total) by mouth daily.  . metFORMIN (GLUCOPHAGE) 500 MG tablet TAKE 1 TABLET BY MOUTH EVERY DAY  . metoprolol (TOPROL-XL) 200 MG 24 hr tablet TAKE 1 AND 1/2 TABLET DAILY  . nitroGLYCERIN (NITROSTAT) 0.4 MG SL tablet Place 1 tablet (0.4 mg total) under the tongue every 5 (five) minutes as needed for chest pain.  . nystatin cream (MYCOSTATIN) Apply 1 application topically 2 (two) times daily.  . triamcinolone (KENALOG) 0.025 % ointment Apply 1 application topically 2 (two) times daily.   No facility-administered encounter medications on file as of 11/05/2017.       New complaints: Can't sleep for a few weeks, occurs a couple nights every   week she is up all night.  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  HENT: Negative for congestion, ear pain and postnasal drip.   Eyes: Negative for pain and discharge.  Respiratory: Negative for cough, chest tightness and shortness of breath.   Cardiovascular: Negative for chest pain, palpitations and leg  swelling.  Gastrointestinal: Negative for abdominal pain, constipation and diarrhea.  Endocrine: Negative for cold intolerance and heat intolerance.  Genitourinary: Negative for difficulty urinating and dysuria.  Musculoskeletal: Negative for arthralgias and back pain.  Skin: Negative for pallor, rash and wound.  Allergic/Immunologic: Negative for environmental allergies and food allergies.  Neurological: Negative for dizziness and headaches.  Hematological: Does not bruise/bleed easily.  Psychiatric/Behavioral: Negative for agitation, behavioral problems and confusion.       Objective:   Physical Exam  Constitutional: She is oriented to person, place, and time. She appears well-developed and well-nourished.  HENT:  Head: Normocephalic and atraumatic.  Right Ear: External ear normal.  Left Ear: External ear normal.  Nose: Nose normal.  Mouth/Throat: Oropharynx is clear and moist.  Eyes: Pupils are equal, round, and reactive to light. EOM are normal.  Neck: Normal range of motion. Neck supple.  Cardiovascular: Regular rhythm, normal heart sounds and intact distal pulses.  Pulses:      Dorsalis pedis pulses are 1+ on the right side, and 1+ on the left side.       Posterior tibial pulses are 1+ on the right side, and 1+ on the left side.  Non-pitting bilateral lower extremity pedal edema-patient has not taken lasix pil yet today  Pulmonary/Chest: Effort normal.  Abdominal: Soft. Bowel sounds are normal.  Musculoskeletal: Normal range of motion.  Neurological: She is alert and oriented to person, place, and time. She displays normal reflexes. No cranial nerve deficit.  Skin: Skin is warm and dry.  Psychiatric: She has a normal mood and affect. Her behavior is normal. Thought content normal.  Nursing note and vitals reviewed.     BP 138/72   Pulse 61   Temp 98.2 F (36.8 C) (Oral)   Ht 5' 1" (1.549 m)   Wt 198 lb (89.8 kg)   LMP 05/11/1992   BMI 37.41 kg/m     Assessment  & Plan:  Veronica Paul comes in today with chief complaint of Medical Management of Chronic Issues   Diagnosis and orders addressed:  1. Hypertension, essential -continue Norvasc, Metoprolol - CMP14+EGFR  2. Diabetes mellitus without complication (HCC) -continue Metformin - Bayer DCA Hb A1c Waived - Microalbumin / creatinine urine ratio -low carb diet -encouraged patient to continue to check blood sugars  3. Other specified hypothyroidism -continue levothyroxine  4. Osteopenia of other site -encouraged dietary sources of calcium & Vitamin D -encouraged weight-bearing exercises  5. Mixed hyperlipidemia -encouraged low fat diet - Lipid panel  6. Recurrent major depressive disorder, in partial remission (HCC) -declined further treatment  7. Peripheral edema -continue Lasix -compression hose  -elevated legs when sitting  8. GAD (generalized anxiety disorder) -klonopin as needed -tylenol PM as needed -sleep hygiene   Labs pending Health Maintenance reviewed Diet and exercise encouraged  Follow up plan: 3 months     [name], FNP

## 2017-11-05 NOTE — Addendum Note (Signed)
Addended by: Chevis Pretty on: 11/05/2017 09:28 AM   Modules accepted: Orders

## 2017-11-06 ENCOUNTER — Other Ambulatory Visit: Payer: Self-pay | Admitting: Nurse Practitioner

## 2017-11-06 LAB — CMP14+EGFR
ALT: 13 IU/L (ref 0–32)
AST: 17 IU/L (ref 0–40)
Albumin/Globulin Ratio: 1.9 (ref 1.2–2.2)
Albumin: 4.3 g/dL (ref 3.5–4.7)
Alkaline Phosphatase: 73 IU/L (ref 39–117)
BUN/Creatinine Ratio: 9 — ABNORMAL LOW (ref 12–28)
BUN: 10 mg/dL (ref 8–27)
Bilirubin Total: 0.5 mg/dL (ref 0.0–1.2)
CO2: 25 mmol/L (ref 20–29)
Calcium: 9.3 mg/dL (ref 8.7–10.3)
Chloride: 101 mmol/L (ref 96–106)
Creatinine, Ser: 1.07 mg/dL — ABNORMAL HIGH (ref 0.57–1.00)
GFR calc Af Amer: 57 mL/min/{1.73_m2} — ABNORMAL LOW (ref >59)
GFR calc non Af Amer: 49 mL/min/{1.73_m2} — ABNORMAL LOW (ref >59)
Globulin, Total: 2.3 g/dL (ref 1.5–4.5)
Glucose: 93 mg/dL (ref 65–99)
Potassium: 4.3 mmol/L (ref 3.5–5.2)
Sodium: 142 mmol/L (ref 134–144)
Total Protein: 6.6 g/dL (ref 6.0–8.5)

## 2017-11-06 LAB — MICROALBUMIN / CREATININE URINE RATIO
Creatinine, Urine: 104.6 mg/dL
Microalb/Creat Ratio: 23.4 mg/g creat (ref 0.0–30.0)
Microalbumin, Urine: 24.5 ug/mL

## 2017-11-06 LAB — LIPID PANEL
Chol/HDL Ratio: 5.8 ratio — ABNORMAL HIGH (ref 0.0–4.4)
Cholesterol, Total: 227 mg/dL — ABNORMAL HIGH (ref 100–199)
HDL: 39 mg/dL — ABNORMAL LOW (ref >39)
LDL Calculated: 156 mg/dL — ABNORMAL HIGH (ref 0–99)
Triglycerides: 160 mg/dL — ABNORMAL HIGH (ref 0–149)
VLDL Cholesterol Cal: 32 mg/dL (ref 5–40)

## 2017-11-06 LAB — THYROID PANEL WITH TSH
Free Thyroxine Index: 3.6 (ref 1.2–4.9)
T3 UPTAKE RATIO: 32 % (ref 24–39)
T4 TOTAL: 11.4 ug/dL (ref 4.5–12.0)
TSH: 0.151 u[IU]/mL — ABNORMAL LOW (ref 0.450–4.500)

## 2017-11-06 MED ORDER — LEVOTHYROXINE SODIUM 75 MCG PO TABS: 75.0000 ug | ORAL_TABLET | Freq: Every day | ORAL | 11 refills | Status: DC

## 2017-11-23 ENCOUNTER — Ambulatory Visit (INDEPENDENT_AMBULATORY_CARE_PROVIDER_SITE_OTHER): Payer: Medicare HMO | Admitting: *Deleted

## 2017-11-23 DIAGNOSIS — Z23 Encounter for immunization: Secondary | ICD-10-CM

## 2018-02-11 ENCOUNTER — Encounter: Payer: Self-pay | Admitting: Nurse Practitioner

## 2018-02-11 ENCOUNTER — Ambulatory Visit (INDEPENDENT_AMBULATORY_CARE_PROVIDER_SITE_OTHER): Payer: Medicare HMO | Admitting: Nurse Practitioner

## 2018-02-11 VITALS — Temp 96.7°F | Ht 61.0 in | Wt 196.8 lb

## 2018-02-11 DIAGNOSIS — E782 Mixed hyperlipidemia: Secondary | ICD-10-CM

## 2018-02-11 DIAGNOSIS — M8588 Other specified disorders of bone density and structure, other site: Secondary | ICD-10-CM

## 2018-02-11 DIAGNOSIS — E119 Type 2 diabetes mellitus without complications: Secondary | ICD-10-CM | POA: Diagnosis not present

## 2018-02-11 DIAGNOSIS — Z6834 Body mass index (BMI) 34.0-34.9, adult: Secondary | ICD-10-CM | POA: Diagnosis not present

## 2018-02-11 DIAGNOSIS — R69 Illness, unspecified: Secondary | ICD-10-CM | POA: Diagnosis not present

## 2018-02-11 DIAGNOSIS — R609 Edema, unspecified: Secondary | ICD-10-CM

## 2018-02-11 DIAGNOSIS — E038 Other specified hypothyroidism: Secondary | ICD-10-CM

## 2018-02-11 DIAGNOSIS — I10 Essential (primary) hypertension: Secondary | ICD-10-CM | POA: Diagnosis not present

## 2018-02-11 DIAGNOSIS — F3341 Major depressive disorder, recurrent, in partial remission: Secondary | ICD-10-CM

## 2018-02-11 DIAGNOSIS — F411 Generalized anxiety disorder: Secondary | ICD-10-CM

## 2018-02-11 LAB — BAYER DCA HB A1C WAIVED: HB A1C (BAYER DCA - WAIVED): 5.9 % (ref <7.0)

## 2018-02-11 MED ORDER — LOSARTAN POTASSIUM 100 MG PO TABS: 100.0000 mg | ORAL_TABLET | Freq: Every day | ORAL | 1 refills | Status: DC

## 2018-02-11 MED ORDER — FUROSEMIDE 40 MG PO TABS: 40.0000 mg | ORAL_TABLET | Freq: Every day | ORAL | 1 refills | Status: DC

## 2018-02-11 MED ORDER — METFORMIN HCL 500 MG PO TABS: 500.0000 mg | ORAL_TABLET | Freq: Every day | ORAL | 1 refills | Status: DC

## 2018-02-11 MED ORDER — CLONAZEPAM 0.5 MG PO TABS: 0.5000 mg | ORAL_TABLET | Freq: Two times a day (BID) | ORAL | 2 refills | Status: DC | PRN

## 2018-02-11 MED ORDER — AMLODIPINE BESYLATE 5 MG PO TABS: 5.0000 mg | ORAL_TABLET | Freq: Every day | ORAL | 1 refills | Status: DC

## 2018-02-11 MED ORDER — METOPROLOL SUCCINATE ER 200 MG PO TB24: ORAL_TABLET | ORAL | 1 refills | Status: DC

## 2018-02-11 MED ORDER — LEVOTHYROXINE SODIUM 75 MCG PO TABS: 75.0000 ug | ORAL_TABLET | Freq: Every day | ORAL | 1 refills | Status: DC

## 2018-02-11 NOTE — Progress Notes (Signed)
Subjective:    Patient ID: Veronica Paul, female    DOB: 1935-04-26, 82 y.o.   MRN: [id number]   Chief Complaint: Medical Management of Chronic Issues   HPI:  1. Diabetes mellitus without complication ([place])  Last hgba1c was 5.9. patient does not check blood sugars very often at home. She denies any symptoms of hypoglycemia.  2. Hypertension, essential No c/o chest pain, sob or headache. Does not check blood pressure at home  3. Other specified hypothyroidism Having no problems that she is aware of.  4. Osteopenia of other site Last dexascan was done 07/16/17 with t score of -.06 which is considered normal. She was on fosamax for a few years but is currently not on anything.  5. Peripheral edema  She has some edema daily. Usually resolves at night.  6. Mixed hyperlipidemia  Doe snot really watch diet and does no exercise. She tries to stay active though.  7. GAD (generalized anxiety disorder)  She takes a klonopin bid. She has been taking these for years. Really helps her to relax.  8. Recurrent major depressive disorder, in partial remission ([place])  Currently only taking klonopin. No antidepressant. Depression screen [facility] 2/9 02/11/2018 11/05/2017 11/05/2017  Decreased Interest 0 0 0  Down, Depressed, Hopeless 0 0 0  PHQ - 2 Score 0 0 0  Altered sleeping - 1 -  Tired, decreased energy - 3 -  Change in appetite - 0 -  Feeling bad or failure about yourself  - 0 -  Trouble concentrating - 0 -  Moving slowly or fidgety/restless - 0 -  Suicidal thoughts - 0 -  PHQ-9 Score - 4 -  Difficult doing work/chores - Somewhat difficult -  Some recent data might be hidden     9. BMI 34.0-34.9,adult  No recent weight changes    Outpatient Encounter Medications as of 02/11/2018  Medication Sig  . amLODipine (NORVASC) 5 MG tablet Take 1 tablet (5 mg total) by mouth daily.  Marland Kitchen aspirin EC 81 MG EC tablet Take 81 mg by mouth daily.    Marland Kitchen BLACK COHOSH PO Take by mouth.    . clonazePAM  (KLONOPIN) 0.5 MG tablet Take 1 tablet (0.5 mg total) by mouth 2 (two) times daily as needed.  . fish oil-omega-3 fatty acids 1000 MG capsule Take 2 g by mouth daily.    . fluticasone (FLONASE) 50 MCG/ACT nasal spray Place 2 sprays into both nostrils daily.  . furosemide (LASIX) 40 MG tablet Take 1 tablet (40 mg total) by mouth daily.  Marland Kitchen glucose blood test strip Check BS BID and PRN . DX.E11.9  . Lancets (ONETOUCH ULTRASOFT) lancets Check BS BID and PRN. DX. E11.9  . levothyroxine (SYNTHROID) 75 MCG tablet Take 1 tablet (75 mcg total) by mouth daily.  Marland Kitchen losartan (COZAAR) 100 MG tablet Take 1 tablet (100 mg total) by mouth daily.  . metFORMIN (GLUCOPHAGE) 500 MG tablet Take 1 tablet (500 mg total) by mouth daily.  . metoprolol (TOPROL-XL) 200 MG 24 hr tablet TAKE 1 AND 1/2 TABLET DAILY  . nitroGLYCERIN (NITROSTAT) 0.4 MG SL tablet Place 1 tablet (0.4 mg total) under the tongue every 5 (five) minutes as needed for chest pain.  Marland Kitchen nystatin cream (MYCOSTATIN) Apply 1 application topically 2 (two) times daily.  Marland Kitchen triamcinolone (KENALOG) 0.025 % ointment Apply 1 application topically 2 (two) times daily.       New complaints: None today  Social history: She lives alone, has 4 children  that she is close with.   Review of Systems  Constitutional: Negative for activity change and appetite change.  HENT: Negative.   Eyes: Negative for pain.  Respiratory: Negative for shortness of breath.   Cardiovascular: Negative for chest pain, palpitations and leg swelling.  Gastrointestinal: Negative for abdominal pain.  Endocrine: Negative for polydipsia.  Genitourinary: Negative.   Skin: Negative for rash.  Neurological: Negative for dizziness, weakness and headaches.  Hematological: Does not bruise/bleed easily.  Psychiatric/Behavioral: Negative.   All other systems reviewed and are negative.      Objective:   Physical Exam Vitals signs and nursing note reviewed.  Constitutional:       General: She is not in acute distress.    Appearance: Normal appearance. She is well-developed.  HENT:     Head: Normocephalic.     Nose: Nose normal.  Eyes:     Pupils: Pupils are equal, round, and reactive to light.  Neck:     Musculoskeletal: Normal range of motion and neck supple.     Vascular: No carotid bruit or JVD.  Cardiovascular:     Rate and Rhythm: Normal rate and regular rhythm.     Heart sounds: Normal heart sounds.  Pulmonary:     Effort: Pulmonary effort is normal. No respiratory distress.     Breath sounds: Normal breath sounds. No wheezing or rales.  Chest:     Chest wall: No tenderness.  Abdominal:     General: Bowel sounds are normal. There is no distension or abdominal bruit.     Palpations: Abdomen is soft. There is no hepatomegaly, splenomegaly, mass or pulsatile mass.     Tenderness: There is no abdominal tenderness.  Musculoskeletal: Normal range of motion.  Lymphadenopathy:     Cervical: No cervical adenopathy.  Skin:    General: Skin is warm and dry.     Capillary Refill: Capillary refill takes less than 2 seconds.  Neurological:     General: No focal deficit present.     Mental Status: She is alert and oriented to person, place, and time.     Deep Tendon Reflexes: Reflexes are normal and symmetric.  Psychiatric:        Behavior: Behavior normal.        Thought Content: Thought content normal.        Judgment: Judgment normal.    Temp (!) 96.7 F (35.9 C) (Oral)   Ht '5\' 1"'  (1.549 m)   Wt 196 lb 12.8 oz (89.3 kg)   LMP 05/11/1992   BMI 37.19 kg/m   hgba1c 5.9%       Assessment & Plan:  Veronica Paul comes in today with chief complaint of Medical Management of Chronic Issues   Diagnosis and orders addressed:  1. Diabetes mellitus without complication ([place]) continue to watch carbs in diet - Bayer DCA Hb A1c Waived  2. Essential hypertension Low sodium diet - CMP14+EGFR - amLODipine (NORVASC) 5 MG tablet; Take 1 tablet (5 mg  total) by mouth daily.  Dispense: 90 tablet; Refill: 1 - losartan (COZAAR) 100 MG tablet; Take 1 tablet (100 mg total) by mouth daily.  Dispense: 90 tablet; Refill: 1 - metoprolol (TOPROL-XL) 200 MG 24 hr tablet; TAKE 1 AND 1/2 TABLET DAILY  Dispense: 135 tablet; Refill: 1  3. Other specified hypothyroidism - Thyroid Panel With TSH  4. Osteopenia of other site Weight bearing exercises  5. Peripheral edema Elevate legs when sitting - furosemide (LASIX) 40 MG tablet; Take 1  tablet (40 mg total) by mouth daily.  Dispense: 90 tablet; Refill: 1  6. Mixed hyperlipidemia Low fat diet - Lipid panel  7. GAD (generalized anxiety disorder) Stress management - clonazePAM (KLONOPIN) 0.5 MG tablet; Take 1 tablet (0.5 mg total) by mouth 2 (two) times daily as needed.  Dispense: 60 tablet; Refill: 2  8. Recurrent major depressive disorder, in partial remission ([place])  9. BMI 34.0-34.9,adult Discussed diet and exercise for person with BMI >25 Will recheck weight in 3-6 months   Labs pending Health Maintenance reviewed Diet and exercise encouraged  Follow up plan: 3 months   [name], FNP

## 2018-02-11 NOTE — Patient Instructions (Signed)
Fall Prevention in the Home, Adult  Falls can cause injuries. They can happen to people of all ages. There are many things you can do to make your home safe and to help prevent falls. Ask for help when making these changes, if needed.  What actions can I take to prevent falls?  General Instructions  · Use good lighting in all rooms. Replace any light bulbs that burn out.  · Turn on the lights when you go into a dark area. Use night-lights.  · Keep items that you use often in easy-to-reach places. Lower the shelves around your home if necessary.  · Set up your furniture so you have a clear path. Avoid moving your furniture around.  · Do not have throw rugs and other things on the floor that can make you trip.  · Avoid walking on wet floors.  · If any of your floors are uneven, fix them.  · Add color or contrast paint or tape to clearly mark and help you see:  ? Any grab bars or handrails.  ? First and last steps of stairways.  ? Where the edge of each step is.  · If you use a stepladder:  ? Make sure that it is fully opened. Do not climb a closed stepladder.  ? Make sure that both sides of the stepladder are locked into place.  ? Ask someone to hold the stepladder for you while you use it.  · If there are any pets around you, be aware of where they are.  What can I do in the bathroom?         · Keep the floor dry. Clean up any water that spills onto the floor as soon as it happens.  · Remove soap buildup in the tub or shower regularly.  · Use non-skid mats or decals on the floor of the tub or shower.  · Attach bath mats securely with double-sided, non-slip rug tape.  · If you need to sit down in the shower, use a plastic, non-slip stool.  · Install grab bars by the toilet and in the tub and shower. Do not use towel bars as grab bars.  What can I do in the bedroom?  · Make sure that you have a light by your bed that is easy to reach.  · Do not use any sheets or blankets that are too big for your bed. They should  not hang down onto the floor.  · Have a firm chair that has side arms. You can use this for support while you get dressed.  What can I do in the kitchen?  · Clean up any spills right away.  · If you need to reach something above you, use a strong step stool that has a grab bar.  · Keep electrical cords out of the way.  · Do not use floor polish or wax that makes floors slippery. If you must use wax, use non-skid floor wax.  What can I do with my stairs?  · Do not leave any items on the stairs.  · Make sure that you have a light switch at the top of the stairs and the bottom of the stairs. If you do not have them, ask someone to add them for you.  · Make sure that there are handrails on both sides of the stairs, and use them. Fix handrails that are broken or loose. Make sure that handrails are as long as the stairways.  ·   Install non-slip stair treads on all stairs in your home.  · Avoid having throw rugs at the top or bottom of the stairs. If you do have throw rugs, attach them to the floor with carpet tape.  · Choose a carpet that does not hide the edge of the steps on the stairway.  · Check any carpeting to make sure that it is firmly attached to the stairs. Fix any carpet that is loose or worn.  What can I do on the outside of my home?  · Use bright outdoor lighting.  · Regularly fix the edges of walkways and driveways and fix any cracks.  · Remove anything that might make you trip as you walk through a door, such as a raised step or threshold.  · Trim any bushes or trees on the path to your home.  · Regularly check to see if handrails are loose or broken. Make sure that both sides of any steps have handrails.  · Install guardrails along the edges of any raised decks and porches.  · Clear walking paths of anything that might make someone trip, such as tools or rocks.  · Have any leaves, snow, or ice cleared regularly.  · Use sand or salt on walking paths during winter.  · Clean up any spills in your garage right  away. This includes grease or oil spills.  What other actions can I take?  · Wear shoes that:  ? Have a low heel. Do not wear high heels.  ? Have rubber bottoms.  ? Are comfortable and fit you well.  ? Are closed at the toe. Do not wear open-toe sandals.  · Use tools that help you move around (mobility aids) if they are needed. These include:  ? Canes.  ? Walkers.  ? Scooters.  ? Crutches.  · Review your medicines with your doctor. Some medicines can make you feel dizzy. This can increase your chance of falling.  Ask your doctor what other things you can do to help prevent falls.  Where to find more information  · Centers for Disease Control and Prevention, [employer]: https://cdc.gov  · National Institute on Aging: https://go4life.nia.nih.gov  Contact a doctor if:  · You are afraid of falling at home.  · You feel weak, drowsy, or dizzy at home.  · You fall at home.  Summary  · There are many simple things that you can do to make your home safe and to help prevent falls.  · Ways to make your home safe include removing tripping hazards and installing grab bars in the bathroom.  · Ask for help when making these changes in your home.  This information is not intended to replace advice given to you by your health care provider. Make sure you discuss any questions you have with your health care provider.  Document Released: 11/30/2008 Document Revised: 09/18/2016 Document Reviewed: 09/18/2016  Elsevier Interactive Patient Education © 2019 [employer]

## 2018-02-12 LAB — CMP14+EGFR
ALBUMIN: 4.4 g/dL (ref 3.5–4.7)
ALT: 12 IU/L (ref 0–32)
AST: 14 IU/L (ref 0–40)
Albumin/Globulin Ratio: 1.9 (ref 1.2–2.2)
Alkaline Phosphatase: 72 IU/L (ref 39–117)
BUN / CREAT RATIO: 9 — AB (ref 12–28)
BUN: 10 mg/dL (ref 8–27)
Bilirubin Total: 0.4 mg/dL (ref 0.0–1.2)
CALCIUM: 9.2 mg/dL (ref 8.7–10.3)
CO2: 27 mmol/L (ref 20–29)
CREATININE: 1.14 mg/dL — AB (ref 0.57–1.00)
Chloride: 101 mmol/L (ref 96–106)
GFR calc Af Amer: 52 mL/min/{1.73_m2} — ABNORMAL LOW (ref >59)
GFR, EST NON AFRICAN AMERICAN: 46 mL/min/{1.73_m2} — AB (ref >59)
GLOBULIN, TOTAL: 2.3 g/dL (ref 1.5–4.5)
Glucose: 96 mg/dL (ref 65–99)
Potassium: 3.9 mmol/L (ref 3.5–5.2)
SODIUM: 145 mmol/L — AB (ref 134–144)
Total Protein: 6.7 g/dL (ref 6.0–8.5)

## 2018-02-12 LAB — LIPID PANEL
CHOL/HDL RATIO: 5 ratio — AB (ref 0.0–4.4)
Cholesterol, Total: 248 mg/dL — ABNORMAL HIGH (ref 100–199)
HDL: 50 mg/dL (ref >39)
LDL CALC: 166 mg/dL — AB (ref 0–99)
TRIGLYCERIDES: 159 mg/dL — AB (ref 0–149)
VLDL Cholesterol Cal: 32 mg/dL (ref 5–40)

## 2018-02-12 LAB — THYROID PANEL WITH TSH
Free Thyroxine Index: 2.7 (ref 1.2–4.9)
T3 UPTAKE RATIO: 29 % (ref 24–39)
T4 TOTAL: 9.2 ug/dL (ref 4.5–12.0)
TSH: 4.06 u[IU]/mL (ref 0.450–4.500)

## 2018-02-22 ENCOUNTER — Other Ambulatory Visit: Payer: Self-pay | Admitting: Nurse Practitioner

## 2018-02-22 MED ORDER — SIMVASTATIN 40 MG PO TABS: 40.0000 mg | ORAL_TABLET | Freq: Every evening | ORAL | 11 refills | Status: DC

## 2018-02-22 NOTE — Progress Notes (Signed)
zocor

## 2018-04-27 DIAGNOSIS — Z1231 Encounter for screening mammogram for malignant neoplasm of breast: Secondary | ICD-10-CM | POA: Diagnosis not present

## 2018-04-27 LAB — HM MAMMOGRAPHY

## 2018-05-05 ENCOUNTER — Other Ambulatory Visit: Payer: Self-pay | Admitting: Nurse Practitioner

## 2018-05-05 DIAGNOSIS — F411 Generalized anxiety disorder: Secondary | ICD-10-CM

## 2018-05-20 ENCOUNTER — Other Ambulatory Visit: Payer: Self-pay

## 2018-05-20 ENCOUNTER — Encounter: Payer: Self-pay | Admitting: Nurse Practitioner

## 2018-05-20 ENCOUNTER — Ambulatory Visit (INDEPENDENT_AMBULATORY_CARE_PROVIDER_SITE_OTHER): Payer: Medicare HMO | Admitting: Nurse Practitioner

## 2018-05-20 DIAGNOSIS — F411 Generalized anxiety disorder: Secondary | ICD-10-CM

## 2018-05-20 DIAGNOSIS — E782 Mixed hyperlipidemia: Secondary | ICD-10-CM | POA: Diagnosis not present

## 2018-05-20 DIAGNOSIS — M8588 Other specified disorders of bone density and structure, other site: Secondary | ICD-10-CM | POA: Diagnosis not present

## 2018-05-20 DIAGNOSIS — I10 Essential (primary) hypertension: Secondary | ICD-10-CM

## 2018-05-20 DIAGNOSIS — E038 Other specified hypothyroidism: Secondary | ICD-10-CM

## 2018-05-20 DIAGNOSIS — R69 Illness, unspecified: Secondary | ICD-10-CM | POA: Diagnosis not present

## 2018-05-20 DIAGNOSIS — Z6834 Body mass index (BMI) 34.0-34.9, adult: Secondary | ICD-10-CM | POA: Diagnosis not present

## 2018-05-20 DIAGNOSIS — F3341 Major depressive disorder, recurrent, in partial remission: Secondary | ICD-10-CM

## 2018-05-20 DIAGNOSIS — N904 Leukoplakia of vulva: Secondary | ICD-10-CM

## 2018-05-20 DIAGNOSIS — E119 Type 2 diabetes mellitus without complications: Secondary | ICD-10-CM

## 2018-05-20 DIAGNOSIS — R609 Edema, unspecified: Secondary | ICD-10-CM | POA: Diagnosis not present

## 2018-05-20 MED ORDER — CLONAZEPAM 0.5 MG PO TABS: 0.5000 mg | ORAL_TABLET | Freq: Two times a day (BID) | ORAL | 1 refills | Status: DC | PRN

## 2018-05-20 MED ORDER — TRIAMCINOLONE ACETONIDE 0.025 % EX OINT: 1.0000 "application " | TOPICAL_OINTMENT | Freq: Two times a day (BID) | CUTANEOUS | 3 refills | Status: DC

## 2018-05-20 NOTE — Progress Notes (Addendum)
Patient ID: Veronica Paul, female   DOB: September 19, 1935, 83 y.o.   MRN: [id number]    Virtual Visit via telephone Note  I connected with Veronica Paul on 05/20/18 at 8:30 AM by telephone and verified that I am speaking with the correct person using two identifiers. Veronica Paul Veronica Paul is currently located at home and no one is currently with her during visit. The provider, [name], FNP is located in their office at time of visit.  I discussed the limitations, risks, security and privacy concerns of performing an evaluation and management service by telephone and the availability of in person appointments. I also discussed with the patient that there may be a patient responsible charge related to this service. The patient expressed understanding and agreed to proceed.   History and Present Illness:   Chief Complaint: medical management of chronic issues  HPI:  1. Essential hypertension  No c/o chest pain, SOB or headache. Does not check blood pressure at home.  BP Readings from Last 3 Encounters:  11/05/17 138/72  07/31/17 138/72  07/16/17 126/62     2. Diabetes mellitus without complication (HCC)  Last HGBA1C was 5.9%.   3. Mixed hyperlipidemia  She is watching diet. She is on zocor and it is causing muscle aches.  4. Peripheral edema  She has daily lower ext swelling that will usually resolve at night.  5. Other specified hypothyroidism  No problem that aware of.  6. Osteopenia of other site  Last dexscan was done 07/16/17 with t score of 0.6. she is no longer going to have dexascan  7. Recurrent major depressive disorder, in partial remission ([place])  She denies depression. Depression screen [facility] 2/9 05/20/2018 02/11/2018 11/05/2017  Decreased Interest 0 0 0  Down, Depressed, Hopeless 0 0 0  PHQ - 2 Score 0 0 0  Altered sleeping - - 1  Tired, decreased energy - - 3  Change in appetite - - 0  Feeling bad or failure about yourself  - - 0  Trouble concentrating - - 0   Moving slowly or fidgety/restless - - 0  Suicidal thoughts - - 0  PHQ-9 Score - - 4  Difficult doing work/chores - - Somewhat difficult  Some recent data might be hidden     8. GAD (generalized anxiety disorder)  she stays anxious. She is on klonopin BID  9. BMI 34.0-34.9,adult  No recent weight changes    Outpatient Encounter Medications as of 05/20/2018  Medication Sig  . amLODipine (NORVASC) 5 MG tablet Take 1 tablet (5 mg total) by mouth daily.  Marland Kitchen aspirin EC 81 MG EC tablet Take 81 mg by mouth daily.    Marland Kitchen BLACK COHOSH PO Take by mouth.    . clonazePAM (KLONOPIN) 0.5 MG tablet TAKE 1 TABLET (0.5 MG TOTAL) BY MOUTH 2 (TWO) TIMES DAILY AS NEEDED.  . fish oil-omega-3 fatty acids 1000 MG capsule Take 2 g by mouth daily.    . fluticasone (FLONASE) 50 MCG/ACT nasal spray Place 2 sprays into both nostrils daily.  . furosemide (LASIX) 40 MG tablet Take 1 tablet (40 mg total) by mouth daily.  Marland Kitchen glucose blood test strip Check BS BID and PRN . DX.E11.9  . Lancets (ONETOUCH ULTRASOFT) lancets Check BS BID and PRN. DX. E11.9  . levothyroxine (SYNTHROID) 75 MCG tablet Take 1 tablet (75 mcg total) by mouth daily.  Marland Kitchen losartan (COZAAR) 100 MG tablet Take 1 tablet (100 mg total) by mouth daily.  . metFORMIN (  GLUCOPHAGE) 500 MG tablet Take 1 tablet (500 mg total) by mouth daily.  . metoprolol (TOPROL-XL) 200 MG 24 hr tablet TAKE 1 AND 1/2 TABLET DAILY  . nitroGLYCERIN (NITROSTAT) 0.4 MG SL tablet Place 1 tablet (0.4 mg total) under the tongue every 5 (five) minutes as needed for chest pain.  Marland Kitchen nystatin cream (MYCOSTATIN) Apply 1 application topically 2 (two) times daily.  . simvastatin (ZOCOR) 40 MG tablet Take 1 tablet (40 mg total) by mouth every evening.  . triamcinolone (KENALOG) 0.025 % ointment Apply 1 application topically 2 (two) times daily.     New complaints: None today  Social history: Lives alone and her daughter checks on her daily  Review of Systems  Constitutional: Negative.   Negative for diaphoresis and weight loss.  Eyes: Negative for blurred vision, double vision and pain.  Respiratory: Negative for shortness of breath.   Cardiovascular: Negative for chest pain, palpitations, orthopnea and leg swelling.  Gastrointestinal: Negative for abdominal pain.  Skin: Negative for rash.  Neurological: Negative for dizziness, sensory change, loss of consciousness, weakness and headaches.  Endo/Heme/Allergies: Negative for polydipsia. Does not bruise/bleed easily.  Psychiatric/Behavioral: Negative for memory loss. The patient does not have insomnia.   All other systems reviewed and are negative.      Observations/Objective: Alert and oriented- answers all questions appropriately BP 136/72 BS 104  Assessment and Plan: DODI LEU calls in today with chief complaint of medical management of chronic issues  Diagnosis and orders addressed:  1. Essential hypertension *low sodium diet  2. Diabetes mellitus without complication ([place]) Continue to watch carbs in diet Check blood sugars daily Try to get some exercise  3. Mixed hyperlipidemia Low fat diet  4. Peripheral edema Elevate legs when sitting  5. Other specified hypothyroidism  6. Osteopenia of other site Weight bearing exercise  7. Recurrent major depressive disorder, in partial remission ([place]) Call friends Get outside and walk to mail bbox  8. GAD (generalized anxiety disorder) Stress management - clonazePAM (KLONOPIN) 0.5 MG tablet; Take 1 tablet (0.5 mg total) by mouth 2 (two) times daily as needed.  Dispense: 60 tablet; Refill: 1  9. BMI 34.0-34.9,adult Discussed diet and exercise for person with BMI >25 Will recheck weight in 3-6 months  10. Lichen sclerosus of female genitalia - triamcinolone (KENALOG) 0.025 % ointment; Apply 1 application topically 2 (two) times daily.  Dispense: 80 g; Refill: 3   Labs pending Health Maintenance reviewed Diet and exercise encouraged   Follow  Up Instructions: 3 months    I discussed the assessment and treatment plan with the patient. The patient was provided an opportunity to ask questions and all were answered. The patient agreed with the plan and demonstrated an understanding of the instructions.   The patient was advised to call back or seek an in-person evaluation if the symptoms worsen or if the condition fails to improve as anticipated.  The above assessment and management plan was discussed with the patient. The patient verbalized understanding of and has agreed to the management plan. Patient is aware to call the clinic if symptoms persist or worsen. Patient is aware when to return to the clinic for a follow-up visit. Patient educated on when it is appropriate to go to the emergency department.    I provided 15 minutes of non-face-to-face time during this encounter.    [name], FNP

## 2018-06-18 ENCOUNTER — Ambulatory Visit (INDEPENDENT_AMBULATORY_CARE_PROVIDER_SITE_OTHER): Payer: Medicare HMO | Admitting: Nurse Practitioner

## 2018-06-18 ENCOUNTER — Encounter: Payer: Self-pay | Admitting: Nurse Practitioner

## 2018-06-18 ENCOUNTER — Other Ambulatory Visit: Payer: Self-pay

## 2018-06-18 ENCOUNTER — Telehealth: Payer: Self-pay | Admitting: Nurse Practitioner

## 2018-06-18 DIAGNOSIS — Z6834 Body mass index (BMI) 34.0-34.9, adult: Secondary | ICD-10-CM

## 2018-06-18 DIAGNOSIS — E119 Type 2 diabetes mellitus without complications: Secondary | ICD-10-CM

## 2018-06-18 DIAGNOSIS — E038 Other specified hypothyroidism: Secondary | ICD-10-CM

## 2018-06-18 DIAGNOSIS — M8588 Other specified disorders of bone density and structure, other site: Secondary | ICD-10-CM | POA: Diagnosis not present

## 2018-06-18 DIAGNOSIS — R609 Edema, unspecified: Secondary | ICD-10-CM | POA: Diagnosis not present

## 2018-06-18 DIAGNOSIS — I10 Essential (primary) hypertension: Secondary | ICD-10-CM

## 2018-06-18 DIAGNOSIS — F3341 Major depressive disorder, recurrent, in partial remission: Secondary | ICD-10-CM

## 2018-06-18 DIAGNOSIS — F411 Generalized anxiety disorder: Secondary | ICD-10-CM

## 2018-06-18 DIAGNOSIS — E782 Mixed hyperlipidemia: Secondary | ICD-10-CM | POA: Diagnosis not present

## 2018-06-18 DIAGNOSIS — R69 Illness, unspecified: Secondary | ICD-10-CM | POA: Diagnosis not present

## 2018-06-18 MED ORDER — AMLODIPINE BESYLATE 5 MG PO TABS: 5.0000 mg | ORAL_TABLET | Freq: Every day | ORAL | 1 refills | Status: DC

## 2018-06-18 MED ORDER — SIMVASTATIN 40 MG PO TABS: 40.0000 mg | ORAL_TABLET | Freq: Every evening | ORAL | 11 refills | Status: DC

## 2018-06-18 MED ORDER — METOPROLOL SUCCINATE ER 200 MG PO TB24: ORAL_TABLET | ORAL | 1 refills | Status: DC

## 2018-06-18 MED ORDER — METFORMIN HCL 500 MG PO TABS: 500.0000 mg | ORAL_TABLET | Freq: Every day | ORAL | 1 refills | Status: DC

## 2018-06-18 MED ORDER — LEVOTHYROXINE SODIUM 75 MCG PO TABS: 75.0000 ug | ORAL_TABLET | Freq: Every day | ORAL | 1 refills | Status: DC

## 2018-06-18 MED ORDER — CITALOPRAM HYDROBROMIDE 20 MG PO TABS: 20.0000 mg | ORAL_TABLET | Freq: Every day | ORAL | 5 refills | Status: DC

## 2018-06-18 MED ORDER — FUROSEMIDE 40 MG PO TABS: 40.0000 mg | ORAL_TABLET | Freq: Every day | ORAL | 1 refills | Status: DC

## 2018-06-18 MED ORDER — LOSARTAN POTASSIUM 100 MG PO TABS: 100.0000 mg | ORAL_TABLET | Freq: Every day | ORAL | 1 refills | Status: DC

## 2018-06-18 NOTE — Progress Notes (Signed)
Virtual Visit via telephone Note  I connected with Veronica Paul on 06/18/18 at 11:25AM by telephone and verified that I am speaking with the correct person using two identifiers. Veronica Paul is currently located at home and  noone is currently with her during visit. The provider, [name], FNP is located in their office at time of visit.  I discussed the limitations, risks, security and privacy concerns of performing an evaluation and management service by telephone and the availability of in person appointments. I also discussed with the patient that there may be a patient responsible charge related to this service. The patient expressed understanding and agreed to proceed.   History and Present Illness:   Chief Complaint: medical management of chronic issues   HPI:  1. Essential hypertension No c/o chest pain, sob or headache. Does not check blood pressure at home. BP Readings from Last 3 Encounters:  11/05/17 138/72  07/31/17 138/72  07/16/17 126/62     2. Mixed hyperlipidemia Does not watch diet and does very little exercise  3. Other specified hypothyroidism No problems that she is aware of  4. Diabetes mellitus without complication (HCC) Last CBJS2G was 5.9%. Her fasting blood sugars are between 110-120. No hypoglycemia.  5. Recurrent major depressive disorder, in partial remission ([place]) Is on no meds for depression- says that she does not want to add more medicines.  6. GAD (generalized anxiety disorder) She takes klonopin 1x daily and that is usually at bedime. Sleeps well most nights.  7. Peripheral edema Has edema by end of the day.resolves during th enight  8. BMI 34.0-34.9,adult Discussed diet and exercise for person with BMI >25 Will recheck weight in 3-6 months  9. Osteopenia of other site Last dexascan was done 07/16/17 with t score of -0.6. she will age out to have anymore dexascan    Outpatient Encounter Medications as of 06/18/2018   Medication Sig  . amLODipine (NORVASC) 5 MG tablet Take 1 tablet (5 mg total) by mouth daily.  Marland Kitchen aspirin EC 81 MG EC tablet Take 81 mg by mouth daily.    Marland Kitchen BLACK COHOSH PO Take by mouth.    . clonazePAM (KLONOPIN) 0.5 MG tablet Take 1 tablet (0.5 mg total) by mouth 2 (two) times daily as needed.  . fish oil-omega-3 fatty acids 1000 MG capsule Take 2 g by mouth daily.    . fluticasone (FLONASE) 50 MCG/ACT nasal spray Place 2 sprays into both nostrils daily.  . furosemide (LASIX) 40 MG tablet Take 1 tablet (40 mg total) by mouth daily.  Marland Kitchen glucose blood test strip Check BS BID and PRN . DX.E11.9  . Lancets (ONETOUCH ULTRASOFT) lancets Check BS BID and PRN. DX. E11.9  . levothyroxine (SYNTHROID) 75 MCG tablet Take 1 tablet (75 mcg total) by mouth daily.  Marland Kitchen losartan (COZAAR) 100 MG tablet Take 1 tablet (100 mg total) by mouth daily.  . metFORMIN (GLUCOPHAGE) 500 MG tablet Take 1 tablet (500 mg total) by mouth daily.  . metoprolol (TOPROL-XL) 200 MG 24 hr tablet TAKE 1 AND 1/2 TABLET DAILY  . nitroGLYCERIN (NITROSTAT) 0.4 MG SL tablet Place 1 tablet (0.4 mg total) under the tongue every 5 (five) minutes as needed for chest pain.  Marland Kitchen nystatin cream (MYCOSTATIN) Apply 1 application topically 2 (two) times daily.  . simvastatin (ZOCOR) 40 MG tablet Take 1 tablet (40 mg total) by mouth every evening.  . triamcinolone (KENALOG) 0.025 % ointment Apply 1 application topically 2 (two)  times daily.       New complaints: Just worried about the corona virus  Social history: Lives alone- her daughters check on her daily.      Review of Systems  Constitutional: Negative for diaphoresis and weight loss.  Eyes: Negative for blurred vision, double vision and pain.  Respiratory: Negative for shortness of breath.   Cardiovascular: Positive for leg swelling. Negative for chest pain, palpitations and orthopnea.  Gastrointestinal: Negative for abdominal pain.  Skin: Negative for rash.  Neurological:  Negative for dizziness, sensory change, loss of consciousness, weakness and headaches.  Endo/Heme/Allergies: Negative for polydipsia. Does not bruise/bleed easily.  Psychiatric/Behavioral: Positive for depression. Negative for memory loss. The patient is nervous/anxious. The patient does not have insomnia.   All other systems reviewed and are negative.    Observations/Objective: alert and oriented- answers all questions appropriately No distress noted in voice   Assessment and Plan: Veronica Paul comes in today with chief complaint of No chief complaint on file.   Diagnosis and orders addressed:  1. Essential hypertension Low sodium diet - amLODipine (NORVASC) 5 MG tablet; Take 1 tablet (5 mg total) by mouth daily.  Dispense: 90 tablet; Refill: 1 - losartan (COZAAR) 100 MG tablet; Take 1 tablet (100 mg total) by mouth daily.  Dispense: 90 tablet; Refill: 1 - metoprolol (TOPROL-XL) 200 MG 24 hr tablet; TAKE 1 AND 1/2 TABLET DAILY  Dispense: 135 tablet; Refill: 1  2. Mixed hyperlipidemia Low fat diet - simvastatin (ZOCOR) 40 MG tablet; Take 1 tablet (40 mg total) by mouth every evening.  Dispense: 30 tablet; Refill: 11  3. Other specified hypothyroidism - levothyroxine (SYNTHROID) 75 MCG tablet; Take 1 tablet (75 mcg total) by mouth daily.  Dispense: 90 tablet; Refill: 1  4. Diabetes mellitus without complication ([name]) Watch carbs in diet - metFORMIN (GLUCOPHAGE) 500 MG tablet; Take 1 tablet (500 mg total) by mouth daily.  Dispense: 90 tablet; Refill: 1  5. Recurrent major depressive disorder, in partial remission ([name]) Patient agreed to try low dose celexa 20mg  celexa 20mg  1 po daily #30- 5 refills  6. GAD (generalized anxiety disorder) Stress management  7. Peripheral edema Elevate legs when sitting - furosemide (LASIX) 40 MG tablet; Take 1 tablet (40 mg total) by mouth daily.  Dispense: 90 tablet; Refill: 1  8. BMI 34.0-34.9,adult Discussed diet and exercise for  person with BMI >25 Will recheck weight in 3-6 months  9. Osteopenia of other site Weight bearing exercises as can tolerate   Previous lab results reviewed Health Maintenance reviewed Diet and exercise encouraged  Follow up plan: 3 months      I discussed the assessment and treatment plan with the patient. The patient was provided an opportunity to ask questions and all were answered. The patient agreed with the plan and demonstrated an understanding of the instructions.   The patient was advised to call back or seek an in-person evaluation if the symptoms worsen or if the condition fails to improve as anticipated.  The above assessment and management plan was discussed with the patient. The patient verbalized understanding of and has agreed to the management plan. Patient is aware to call the clinic if symptoms persist or worsen. Patient is aware when to return to the clinic for a follow-up visit. Patient educated on when it is appropriate to go to the emergency department.   Time call ended:  11:40  I provided 15 minutes of non-face-to-face time during this encounter.    Mary-Margaret  Hassell Done, [place]

## 2018-06-18 NOTE — Telephone Encounter (Signed)
Spoke with triage nurse

## 2018-06-21 ENCOUNTER — Telehealth: Payer: Self-pay

## 2018-06-21 NOTE — Telephone Encounter (Signed)
Patient states that she didn't take med you prescribed for depression. She took one and it made her really sick so she doesn't want to take. She says she thinks she will be ok without it. FYI

## 2018-06-30 ENCOUNTER — Other Ambulatory Visit: Payer: Self-pay

## 2018-07-01 ENCOUNTER — Ambulatory Visit (INDEPENDENT_AMBULATORY_CARE_PROVIDER_SITE_OTHER): Payer: Medicare HMO | Admitting: Nurse Practitioner

## 2018-07-01 ENCOUNTER — Encounter: Payer: Self-pay | Admitting: Nurse Practitioner

## 2018-07-01 ENCOUNTER — Other Ambulatory Visit: Payer: Self-pay

## 2018-07-01 VITALS — BP 121/69 | HR 56 | Temp 96.8°F | Ht 61.0 in | Wt 195.0 lb

## 2018-07-01 DIAGNOSIS — F3341 Major depressive disorder, recurrent, in partial remission: Secondary | ICD-10-CM

## 2018-07-01 DIAGNOSIS — F411 Generalized anxiety disorder: Secondary | ICD-10-CM

## 2018-07-01 DIAGNOSIS — E782 Mixed hyperlipidemia: Secondary | ICD-10-CM | POA: Diagnosis not present

## 2018-07-01 DIAGNOSIS — M8588 Other specified disorders of bone density and structure, other site: Secondary | ICD-10-CM | POA: Diagnosis not present

## 2018-07-01 DIAGNOSIS — E119 Type 2 diabetes mellitus without complications: Secondary | ICD-10-CM

## 2018-07-01 DIAGNOSIS — R609 Edema, unspecified: Secondary | ICD-10-CM

## 2018-07-01 DIAGNOSIS — Z6834 Body mass index (BMI) 34.0-34.9, adult: Secondary | ICD-10-CM

## 2018-07-01 DIAGNOSIS — I10 Essential (primary) hypertension: Secondary | ICD-10-CM

## 2018-07-01 DIAGNOSIS — E038 Other specified hypothyroidism: Secondary | ICD-10-CM

## 2018-07-01 DIAGNOSIS — R69 Illness, unspecified: Secondary | ICD-10-CM | POA: Diagnosis not present

## 2018-07-01 LAB — CMP14+EGFR
ALT: 14 IU/L (ref 0–32)
AST: 19 IU/L (ref 0–40)
Albumin/Globulin Ratio: 1.9 (ref 1.2–2.2)
Albumin: 4.4 g/dL (ref 3.7–4.7)
Alkaline Phosphatase: 67 IU/L (ref 39–117)
BUN/Creatinine Ratio: 10 — ABNORMAL LOW (ref 12–28)
BUN: 11 mg/dL (ref 8–27)
Bilirubin Total: 0.5 mg/dL (ref 0.0–1.2)
CO2: 25 mmol/L (ref 20–29)
Calcium: 9 mg/dL (ref 8.7–10.3)
Chloride: 101 mmol/L (ref 96–106)
Creatinine, Ser: 1.13 mg/dL — ABNORMAL HIGH (ref 0.57–1.00)
GFR calc Af Amer: 53 mL/min/{1.73_m2} — ABNORMAL LOW (ref >59)
GFR calc non Af Amer: 46 mL/min/{1.73_m2} — ABNORMAL LOW (ref >59)
Globulin, Total: 2.3 g/dL (ref 1.5–4.5)
Glucose: 97 mg/dL (ref 65–99)
Potassium: 4 mmol/L (ref 3.5–5.2)
Sodium: 143 mmol/L (ref 134–144)
Total Protein: 6.7 g/dL (ref 6.0–8.5)

## 2018-07-01 LAB — LIPID PANEL
Chol/HDL Ratio: 3.2 ratio (ref 0.0–4.4)
Cholesterol, Total: 157 mg/dL (ref 100–199)
HDL: 49 mg/dL (ref >39)
LDL Calculated: 80 mg/dL (ref 0–99)
Triglycerides: 139 mg/dL (ref 0–149)
VLDL Cholesterol Cal: 28 mg/dL (ref 5–40)

## 2018-07-01 LAB — BAYER DCA HB A1C WAIVED: HB A1C (BAYER DCA - WAIVED): 5.7 % (ref <7.0)

## 2018-07-01 MED ORDER — SIMVASTATIN 40 MG PO TABS: 40.0000 mg | ORAL_TABLET | Freq: Every evening | ORAL | 1 refills | Status: DC

## 2018-07-01 MED ORDER — METOPROLOL SUCCINATE ER 200 MG PO TB24: ORAL_TABLET | ORAL | 1 refills | Status: DC

## 2018-07-01 MED ORDER — FUROSEMIDE 40 MG PO TABS: 40.0000 mg | ORAL_TABLET | Freq: Every day | ORAL | 1 refills | Status: DC

## 2018-07-01 MED ORDER — CITALOPRAM HYDROBROMIDE 20 MG PO TABS: 20.0000 mg | ORAL_TABLET | Freq: Every day | ORAL | 1 refills | Status: DC

## 2018-07-01 MED ORDER — METFORMIN HCL 500 MG PO TABS: 500.0000 mg | ORAL_TABLET | Freq: Every day | ORAL | 1 refills | Status: DC

## 2018-07-01 MED ORDER — LEVOTHYROXINE SODIUM 75 MCG PO TABS: 75.0000 ug | ORAL_TABLET | Freq: Every day | ORAL | 1 refills | Status: DC

## 2018-07-01 MED ORDER — LOSARTAN POTASSIUM 100 MG PO TABS: 100.0000 mg | ORAL_TABLET | Freq: Every day | ORAL | 1 refills | Status: DC

## 2018-07-01 MED ORDER — CLONAZEPAM 0.5 MG PO TABS: 0.5000 mg | ORAL_TABLET | Freq: Two times a day (BID) | ORAL | 1 refills | Status: DC | PRN

## 2018-07-01 MED ORDER — AMLODIPINE BESYLATE 5 MG PO TABS: 5.0000 mg | ORAL_TABLET | Freq: Every day | ORAL | 1 refills | Status: DC

## 2018-07-01 NOTE — Progress Notes (Signed)
Subjective:    Patient ID: Veronica Paul, female    DOB: 1935/11/30, 83 y.o.   MRN: [id number]   Chief Complaint: medical management of chronic issues   HPI:  1. Essential hypertension No c/o chest pain, sob or headache. Does not check blood pressure at home. BP Readings from Last 3 Encounters:  11/05/17 138/72  07/31/17 138/72  07/16/17 126/62     2. Mixed hyperlipidemia Does not watch diet and does very little exercise.  3. Diabetes mellitus without complication (HCC) Last [id number] was 5.9% she does not check blood sugars very often  4. Other specified hypothyroidism. Not having any issues that she is aware  5. Recurrent major depressive disorder, in partial remission ([place]) She is currently on celexa and is doing well.   6. GAD (generalized anxiety disorder) Has been a worrier her entire life. The least little thing upsets her and worries her. She is on klonopin BID. She says if it was not for that she would go crazy.  7. Peripheral edema Has daily swelling of bil lower ext. Usually resolves some at night.  8. BMI 34.0-34.9,adult No recent weight changes  9. Osteopenia of other site Last dexascan was done 07/16/17 with t score of - 0.6    Outpatient Encounter Medications as of 07/01/2018  Medication Sig  . amLODipine (NORVASC) 5 MG tablet Take 1 tablet (5 mg total) by mouth daily.  Marland Kitchen aspirin EC 81 MG EC tablet Take 81 mg by mouth daily.    Marland Kitchen BLACK COHOSH PO Take by mouth.    . citalopram (CELEXA) 20 MG tablet Take 1 tablet (20 mg total) by mouth daily.  . clonazePAM (KLONOPIN) 0.5 MG tablet Take 1 tablet (0.5 mg total) by mouth 2 (two) times daily as needed.  . fish oil-omega-3 fatty acids 1000 MG capsule Take 2 g by mouth daily.    . fluticasone (FLONASE) 50 MCG/ACT nasal spray Place 2 sprays into both nostrils daily.  . furosemide (LASIX) 40 MG tablet Take 1 tablet (40 mg total) by mouth daily.  Marland Kitchen glucose blood test strip Check BS BID and PRN . DX.E11.9  .  Lancets (ONETOUCH ULTRASOFT) lancets Check BS BID and PRN. DX. E11.9  . levothyroxine (SYNTHROID) 75 MCG tablet Take 1 tablet (75 mcg total) by mouth daily.  Marland Kitchen losartan (COZAAR) 100 MG tablet Take 1 tablet (100 mg total) by mouth daily.  . metFORMIN (GLUCOPHAGE) 500 MG tablet Take 1 tablet (500 mg total) by mouth daily.  . metoprolol (TOPROL-XL) 200 MG 24 hr tablet TAKE 1 AND 1/2 TABLET DAILY  . nitroGLYCERIN (NITROSTAT) 0.4 MG SL tablet Place 1 tablet (0.4 mg total) under the tongue every 5 (five) minutes as needed for chest pain.  Marland Kitchen nystatin cream (MYCOSTATIN) Apply 1 application topically 2 (two) times daily.  . simvastatin (ZOCOR) 40 MG tablet Take 1 tablet (40 mg total) by mouth every evening.  . triamcinolone (KENALOG) 0.025 % ointment Apply 1 application topically 2 (two) times daily.      New complaints: None today  Social history: Lives alone and her daughter checks on her daily      Review of Systems  Constitutional: Negative for activity change and appetite change.  HENT: Negative.   Eyes: Negative for pain.  Respiratory: Negative for shortness of breath.   Cardiovascular: Negative for chest pain, palpitations and leg swelling.  Gastrointestinal: Negative for abdominal pain.  Endocrine: Negative for polydipsia.  Genitourinary: Negative.   Skin: Negative for rash.  Neurological: Negative for dizziness, weakness and headaches.  Hematological: Does not bruise/bleed easily.  Psychiatric/Behavioral: Negative.   All other systems reviewed and are negative.      Objective:   Physical Exam Vitals signs and nursing note reviewed.  Constitutional:      General: She is not in acute distress.    Appearance: Normal appearance. She is well-developed.  HENT:     Head: Normocephalic.     Nose: Nose normal.  Eyes:     Pupils: Pupils are equal, round, and reactive to light.  Neck:     Musculoskeletal: Normal range of motion and neck supple.     Vascular: No carotid bruit  or JVD.  Cardiovascular:     Rate and Rhythm: Normal rate and regular rhythm.     Heart sounds: Normal heart sounds.  Pulmonary:     Effort: Pulmonary effort is normal. No respiratory distress.     Breath sounds: Normal breath sounds. No wheezing or rales.  Chest:     Chest wall: No tenderness.  Abdominal:     General: Bowel sounds are normal. There is no distension or abdominal bruit.     Palpations: Abdomen is soft. There is no hepatomegaly, splenomegaly, mass or pulsatile mass.     Tenderness: There is no abdominal tenderness.  Musculoskeletal: Normal range of motion.     Right lower leg: Edema (1+) present.     Left lower leg: Edema (1+) present.  Lymphadenopathy:     Cervical: No cervical adenopathy.  Skin:    General: Skin is warm and dry.  Neurological:     Mental Status: She is alert and oriented to person, place, and time.     Deep Tendon Reflexes: Reflexes are normal and symmetric.  Psychiatric:        Behavior: Behavior normal.        Thought Content: Thought content normal.        Judgment: Judgment normal.   BP 121/69   Pulse (!) 56   Temp (!) 96.8 F (36 C) (Oral)   Ht '5\' 1"'  (1.549 m)   Wt 195 lb (88.5 kg)   LMP 05/11/1992   BMI 36.84 kg/m   hgba1c was 5.7%    Assessment & Plan:  [name] comes in today with chief complaint of Medical Management of Chronic Issues   Diagnosis and orders addressed:  1. Essential hypertension Low sodium diet - CMP14+EGFR - amLODipine (NORVASC) 5 MG tablet; Take 1 tablet (5 mg total) by mouth daily.  Dispense: 90 tablet; Refill: 1 - losartan (COZAAR) 100 MG tablet; Take 1 tablet (100 mg total) by mouth daily.  Dispense: 90 tablet; Refill: 1 - metoprolol (TOPROL-XL) 200 MG 24 hr tablet; TAKE 1 AND 1/2 TABLET DAILY  Dispense: 135 tablet; Refill: 1  2. Mixed hyperlipidemia Low fat diet - Lipid panel - simvastatin (ZOCOR) 40 MG tablet; Take 1 tablet (40 mg total) by mouth every evening.  Dispense: 90 tablet; Refill:  1  3. Diabetes mellitus without complication ([name]) Continue to watch carbs in diet - Bayer DCA Hb A1c Waived - metFORMIN (GLUCOPHAGE) 500 MG tablet; Take 1 tablet (500 mg total) by mouth daily.  Dispense: 90 tablet; Refill: 1  4. Other specified hypothyroidism - levothyroxine (SYNTHROID) 75 MCG tablet; Take 1 tablet (75 mcg total) by mouth daily.  Dispense: 90 tablet; Refill: 1  5. Recurrent major depressive disorder, in partial remission (HCC) Stress management - citalopram (CELEXA) 20 MG tablet; Take 1 tablet (  20 mg total) by mouth daily.  Dispense: 90 tablet; Refill: 1  6. GAD (generalized anxiety disorder)  - clonazePAM (KLONOPIN) 0.5 MG tablet; Take 1 tablet (0.5 mg total) by mouth 2 (two) times daily as needed.  Dispense: 60 tablet; Refill: 1  7. Peripheral edema elevate legs when sitting - furosemide (LASIX) 40 MG tablet; Take 1 tablet (40 mg total) by mouth daily.  Dispense: 90 tablet; Refill: 1  8. BMI 34.0-34.9,adult Discussed diet and exercise for person with BMI >25 Will recheck weight in 3-6 months  9. Osteopenia of other site Weight bearing exercise encouraged Will do no more dexascans due to patients age.   Labs pending Health Maintenance reviewed Diet and exercise encouraged  Follow up plan: 3 months   [name], FNP

## 2018-07-01 NOTE — Patient Instructions (Signed)
Fall Prevention in the Home, Adult  Falls can cause injuries. They can happen to people of all ages. There are many things you can do to make your home safe and to help prevent falls. Ask for help when making these changes, if needed.  What actions can I take to prevent falls?  General Instructions  · Use good lighting in all rooms. Replace any light bulbs that burn out.  · Turn on the lights when you go into a dark area. Use night-lights.  · Keep items that you use often in easy-to-reach places. Lower the shelves around your home if necessary.  · Set up your furniture so you have a clear path. Avoid moving your furniture around.  · Do not have throw rugs and other things on the floor that can make you trip.  · Avoid walking on wet floors.  · If any of your floors are uneven, fix them.  · Add color or contrast paint or tape to clearly mark and help you see:  ? Any grab bars or handrails.  ? First and last steps of stairways.  ? Where the edge of each step is.  · If you use a stepladder:  ? Make sure that it is fully opened. Do not climb a closed stepladder.  ? Make sure that both sides of the stepladder are locked into place.  ? Ask someone to hold the stepladder for you while you use it.  · If there are any pets around you, be aware of where they are.  What can I do in the bathroom?         · Keep the floor dry. Clean up any water that spills onto the floor as soon as it happens.  · Remove soap buildup in the tub or shower regularly.  · Use non-skid mats or decals on the floor of the tub or shower.  · Attach bath mats securely with double-sided, non-slip rug tape.  · If you need to sit down in the shower, use a plastic, non-slip stool.  · Install grab bars by the toilet and in the tub and shower. Do not use towel bars as grab bars.  What can I do in the bedroom?  · Make sure that you have a light by your bed that is easy to reach.  · Do not use any sheets or blankets that are too big for your bed. They should  not hang down onto the floor.  · Have a firm chair that has side arms. You can use this for support while you get dressed.  What can I do in the kitchen?  · Clean up any spills right away.  · If you need to reach something above you, use a strong step stool that has a grab bar.  · Keep electrical cords out of the way.  · Do not use floor polish or wax that makes floors slippery. If you must use wax, use non-skid floor wax.  What can I do with my stairs?  · Do not leave any items on the stairs.  · Make sure that you have a light switch at the top of the stairs and the bottom of the stairs. If you do not have them, ask someone to add them for you.  · Make sure that there are handrails on both sides of the stairs, and use them. Fix handrails that are broken or loose. Make sure that handrails are as long as the stairways.  ·   Install non-slip stair treads on all stairs in your home.  · Avoid having throw rugs at the top or bottom of the stairs. If you do have throw rugs, attach them to the floor with carpet tape.  · Choose a carpet that does not hide the edge of the steps on the stairway.  · Check any carpeting to make sure that it is firmly attached to the stairs. Fix any carpet that is loose or worn.  What can I do on the outside of my home?  · Use bright outdoor lighting.  · Regularly fix the edges of walkways and driveways and fix any cracks.  · Remove anything that might make you trip as you walk through a door, such as a raised step or threshold.  · Trim any bushes or trees on the path to your home.  · Regularly check to see if handrails are loose or broken. Make sure that both sides of any steps have handrails.  · Install guardrails along the edges of any raised decks and porches.  · Clear walking paths of anything that might make someone trip, such as tools or rocks.  · Have any leaves, snow, or ice cleared regularly.  · Use sand or salt on walking paths during winter.  · Clean up any spills in your garage right  away. This includes grease or oil spills.  What other actions can I take?  · Wear shoes that:  ? Have a low heel. Do not wear high heels.  ? Have rubber bottoms.  ? Are comfortable and fit you well.  ? Are closed at the toe. Do not wear open-toe sandals.  · Use tools that help you move around (mobility aids) if they are needed. These include:  ? Canes.  ? Walkers.  ? Scooters.  ? Crutches.  · Review your medicines with your doctor. Some medicines can make you feel dizzy. This can increase your chance of falling.  Ask your doctor what other things you can do to help prevent falls.  Where to find more information  · Centers for Disease Control and Prevention, [employer]: https://cdc.gov  · National Institute on Aging: https://go4life.nia.nih.gov  Contact a doctor if:  · You are afraid of falling at home.  · You feel weak, drowsy, or dizzy at home.  · You fall at home.  Summary  · There are many simple things that you can do to make your home safe and to help prevent falls.  · Ways to make your home safe include removing tripping hazards and installing grab bars in the bathroom.  · Ask for help when making these changes in your home.  This information is not intended to replace advice given to you by your health care provider. Make sure you discuss any questions you have with your health care provider.  Document Released: 11/30/2008 Document Revised: 09/18/2016 Document Reviewed: 09/18/2016  Elsevier Interactive Patient Education © 2019 [employer]

## 2018-07-19 ENCOUNTER — Encounter: Payer: Medicare HMO | Admitting: *Deleted

## 2018-07-21 ENCOUNTER — Other Ambulatory Visit: Payer: Self-pay

## 2018-07-21 ENCOUNTER — Ambulatory Visit (INDEPENDENT_AMBULATORY_CARE_PROVIDER_SITE_OTHER): Payer: Medicare HMO | Admitting: *Deleted

## 2018-07-21 DIAGNOSIS — Z0000 Encounter for general adult medical examination without abnormal findings: Secondary | ICD-10-CM | POA: Diagnosis not present

## 2018-07-21 NOTE — Patient Instructions (Signed)
Preventive Care 42 Years and Older, Female Preventive care refers to lifestyle choices and visits with your health care provider that can promote health and wellness. What does preventive care include?  A yearly physical exam. This is also called an annual well check.  Dental exams once or twice a year.  Routine eye exams. Ask your health care provider how often you should have your eyes checked.  Personal lifestyle choices, including: ? Daily care of your teeth and gums. ? Regular physical activity. ? Eating a healthy diet. ? Avoiding tobacco and drug use. ? Limiting alcohol use. ? Practicing safe sex. ? Taking low-dose aspirin every day. ? Taking vitamin and mineral supplements as recommended by your health care provider. What happens during an annual well check? The services and screenings done by your health care provider during your annual well check will depend on your age, overall health, lifestyle risk factors, and family history of disease. Counseling Your health care provider may ask you questions about your:  Alcohol use.  Tobacco use.  Drug use.  Emotional well-being.  Home and relationship well-being.  Sexual activity.  Eating habits.  History of falls.  Memory and ability to understand (cognition).  Work and work Statistician.  Reproductive health.  Screening You may have the following tests or measurements:  Height, weight, and BMI.  Blood pressure.  Lipid and cholesterol levels. These may be checked every 5 years, or more frequently if you are over 30 years old.  Skin check.  Lung cancer screening. You may have this screening every year starting at age 27 if you have a 30-pack-year history of smoking and currently smoke or have quit within the past 15 years.  Colorectal cancer screening. All adults should have this screening starting at age 33 and continuing until age 46. You will have tests every 1-10 years, depending on your results and the  type of screening test. People at increased risk should start screening at an earlier age. Screening tests may include: ? Guaiac-based fecal occult blood testing. ? Fecal immunochemical test (FIT). ? Stool DNA test. ? Virtual colonoscopy. ? Sigmoidoscopy. During this test, a flexible tube with a tiny camera (sigmoidoscope) is used to examine your rectum and lower colon. The sigmoidoscope is inserted through your anus into your rectum and lower colon. ? Colonoscopy. During this test, a long, thin, flexible tube with a tiny camera (colonoscope) is used to examine your entire colon and rectum.  Hepatitis C blood test.  Hepatitis B blood test.  Sexually transmitted disease (STD) testing.  Diabetes screening. This is done by checking your blood sugar (glucose) after you have not eaten for a while (fasting). You may have this done every 1-3 years.  Bone density scan. This is done to screen for osteoporosis. You may have this done starting at age 37.  Mammogram. This may be done every 1-2 years. Talk to your health care provider about how often you should have regular mammograms. Talk with your health care provider about your test results, treatment options, and if necessary, the need for more tests. Vaccines Your health care provider may recommend certain vaccines, such as:  Influenza vaccine. This is recommended every year.  Tetanus, diphtheria, and acellular pertussis (Tdap, Td) vaccine. You may need a Td booster every 10 years.  Varicella vaccine. You may need this if you have not been vaccinated.  Zoster vaccine. You may need this after age 38.  Measles, mumps, and rubella (MMR) vaccine. You may need at least  one dose of MMR if you were born in 1957 or later. You may also need a second dose.  Pneumococcal 13-valent conjugate (PCV13) vaccine. One dose is recommended after age 24.  Pneumococcal polysaccharide (PPSV23) vaccine. One dose is recommended after age 24.  Meningococcal  vaccine. You may need this if you have certain conditions.  Hepatitis A vaccine. You may need this if you have certain conditions or if you travel or work in places where you may be exposed to hepatitis A.  Hepatitis B vaccine. You may need this if you have certain conditions or if you travel or work in places where you may be exposed to hepatitis B.  Haemophilus influenzae type b (Hib) vaccine. You may need this if you have certain conditions. Talk to your health care provider about which screenings and vaccines you need and how often you need them. This information is not intended to replace advice given to you by your health care provider. Make sure you discuss any questions you have with your health care provider. Document Released: 03/02/2015 Document Revised: 03/26/2017 Document Reviewed: 12/05/2014 Elsevier Interactive Patient Education  2019 [employer].

## 2018-07-21 NOTE — Progress Notes (Addendum)
MEDICARE ANNUAL WELLNESS VISIT  07/21/2018  Telephone Visit Disclaimer This Medicare AWV was conducted by telephone due to national recommendations for restrictions regarding the COVID-19 Pandemic (e.g. social distancing).  I verified, using two identifiers, that I am speaking with Veronica Paul or their authorized healthcare agent. I discussed the limitations, risks, security, and privacy concerns of performing an evaluation and management service by telephone and the potential availability of an in-person appointment in the future. The patient expressed understanding and agreed to proceed.   Subjective:  Veronica Paul is a 83 y.o. female patient of Veronica Paul, [place] who had a Medicare Annual Wellness Visit today via telephone. Dhiya is Retired and lives alone. she has 4 children. she reports that she is socially active and does interact with friends/family regularly. she is not physically active and enjoys reading.  Patient Care Team: Veronica Pretty, FNP as PCP - General (Family Medicine) Minus Breeding, MD as Consulting Physician (Cardiology)  Advanced Directives 07/21/2018 07/16/2017 07/15/2016  Does Patient Have a Medical Advance Directive? No No Yes  Type of Advance Directive - Public librarian;Living will;Out of facility DNR (pink MOST or yellow form)  Copy of [place] in Chart? - - No - copy requested  Would patient like information on creating a medical advance directive? No - Patient declined Yes (MAU/Ambulatory/Procedural Areas - Information given) -    Hospital Utilization Over the Past 12 Months: # of hospitalizations or ER visits: 0 # of surgeries: 0  Review of Systems    Patient reports that her overall health is unchanged compared to last year.  Patient Reported Readings (BP, Pulse, CBG, Weight, etc) none  Review of Systems: No complaints  All other systems negative.  Pain Assessment Pain : No/denies pain      Current Medications & Allergies (verified) Allergies as of 07/21/2018      Reactions   Ace Inhibitors Cough   Feldene [piroxicam]    REACTION: RASH TO HANDS   Meloxicam Nausea And Vomiting   Prevnar [pneumococcal 13-val Conj Vacc]    Statins Other (See Comments)   myalgia   Sulfa Antibiotics    Rash   Zithromax [azithromycin Dihydrate] Itching      Medication List       Accurate as of July 21, 2018  9:45 AM. If you have any questions, ask your nurse or doctor.        STOP taking these medications   citalopram 20 MG tablet Commonly known as:  CeleXA   fluticasone 50 MCG/ACT nasal spray Commonly known as:  FLONASE   nystatin cream Commonly known as:  MYCOSTATIN     TAKE these medications   amLODipine 5 MG tablet Commonly known as:  NORVASC Take 1 tablet (5 mg total) by mouth daily.   aspirin EC 81 MG tablet Take 81 mg by mouth daily.   BLACK COHOSH PO Take by mouth.   clonazePAM 0.5 MG tablet Commonly known as:  KLONOPIN Take 1 tablet (0.5 mg total) by mouth 2 (two) times daily as needed.   diphenhydramine-acetaminophen 25-500 MG Tabs tablet Commonly known as:  TYLENOL PM Take 1 tablet by mouth at bedtime as needed.   fish oil-omega-3 fatty acids 1000 MG capsule Take 2 g by mouth daily.   furosemide 40 MG tablet Commonly known as:  LASIX Take 1 tablet (40 mg total) by mouth daily.   glucose blood test strip Check BS BID and PRN . DX.E11.9  levothyroxine 75 MCG tablet Commonly known as:  Synthroid Take 1 tablet (75 mcg total) by mouth daily.   losartan 100 MG tablet Commonly known as:  COZAAR Take 1 tablet (100 mg total) by mouth daily.   metFORMIN 500 MG tablet Commonly known as:  GLUCOPHAGE Take 1 tablet (500 mg total) by mouth daily.   metoprolol 200 MG 24 hr tablet Commonly known as:  TOPROL-XL TAKE 1 AND 1/2 TABLET DAILY   nitroGLYCERIN 0.4 MG SL tablet Commonly known as:  Nitrostat Place 1 tablet (0.4 mg total) under the tongue every  5 (five) minutes as needed for chest pain.   onetouch ultrasoft lancets Check BS BID and PRN. DX. E11.9   simvastatin 40 MG tablet Commonly known as:  Zocor Take 1 tablet (40 mg total) by mouth every evening.   triamcinolone 0.025 % ointment Commonly known as:  KENALOG Apply 1 application topically 2 (two) times daily.       History (reviewed): Past Medical History:  Diagnosis Date  . Depression   . Diabetes mellitus   . Dyslipidemia   . HTN (hypertension)   . Hypothyroidism   . Lung cancer ([place])   . Obesity   . Osteopenia   . Osteopenia   . Vitamin D deficiency    Past Surgical History:  Procedure Laterality Date  . LUNG REMOVAL, PARTIAL  1998   Right  . THYROIDECTOMY     Family History  Problem Relation Age of Onset  . Coronary artery disease Brother 18  . Hypertension Brother   . Heart disease Brother   . Hypertension Mother   . Hypertension Father   . Heart disease Father   . Hypertension Sister   . Heart disease Sister   . Arthritis Daughter   . COPD Daughter   . Fibromyalgia Daughter   . Stroke Maternal Grandmother   . Cancer Brother   . Hypertension Sister   . Colon cancer Neg Hx   . Food intolerance Neg Hx    Social History   Socioeconomic History  . Marital status: Widowed    Spouse name: Not on file  . Number of children: 4  . Years of education: 66  . Highest education level: 12th grade  Occupational History  . Occupation: Retired    Comment: Interior and spatial designer  . Financial resource strain: Not hard at all  . Food insecurity:    Worry: Never true    Inability: Never true  . Transportation needs:    Medical: No    Non-medical: No  Tobacco Use  . Smoking status: Never Smoker  . Smokeless tobacco: Never Used  Substance and Sexual Activity  . Alcohol use: No  . Drug use: No  . Sexual activity: Not Currently  Lifestyle  . Physical activity:    Days per week: 0 days    Minutes per session: 0 min  . Stress: Rather  much  Relationships  . Social connections:    Talks on phone: More than three times a week    Gets together: More than three times a week    Attends religious service: More than 4 times per year    Active member of club or organization: Yes    Attends meetings of clubs or organizations: More than 4 times per year    Relationship status: Widowed  Other Topics Concern  . Not on file  Social History Narrative  . Not on file    Activities of Daily Living  In your present state of health, do you have any difficulty performing the following activities: 07/21/2018  Hearing? N  Vision? N  Difficulty concentrating or making decisions? N  Walking or climbing stairs? N  Dressing or bathing? N  Doing errands, shopping? N  Preparing Food and eating ? N  Using the Toilet? N  In the past six months, have you accidently leaked urine? N  Do you have problems with loss of bowel control? N  Managing your Medications? N  Managing your Finances? N  Housekeeping or managing your Housekeeping? N  Some recent data might be hidden    Patient Literacy How often do you need to have someone help you when you read instructions, pamphlets, or other written materials from your doctor or pharmacy?: 1 - Never What is the last grade level you completed in school?: 12th grade  Exercise Current Exercise Habits: The patient does not participate in regular exercise at present, Exercise limited by: None identified  Diet Patient reports consuming 3 meals a day and 1 snack(s) a day Patient reports that her primary diet is: Diabetic Patient reports that she does have regular access to food.   Depression Screen PHQ 2/9 Scores 07/21/2018 07/01/2018 06/18/2018 05/20/2018 02/11/2018 11/05/2017 11/05/2017  PHQ - 2 Score 0 0 2 0 0 0 0  PHQ- 9 Score - - 4 - - 4 -  Exception Documentation - - - - - - -     Fall Risk Fall Risk  07/21/2018 07/01/2018 02/11/2018 11/05/2017 07/31/2017  Falls in the past year? 0 0 0 No No  Number  falls in past yr: - - 0 - -     Objective:  Aanya ALEKSA COLLINSWORTH seemed alert and oriented and she participated appropriately during our telephone visit.  Blood Pressure Weight BMI  BP Readings from Last 3 Encounters:  07/01/18 121/69  11/05/17 138/72  07/31/17 138/72   Wt Readings from Last 3 Encounters:  07/01/18 195 lb (88.5 kg)  02/11/18 196 lb 12.8 oz (89.3 kg)  11/05/17 198 lb (89.8 kg)   BMI Readings from Last 1 Encounters:  07/01/18 36.84 kg/m    *Unable to obtain current vital signs, weight, and BMI due to telephone visit type  Hearing/Vision  . Janelli did not seem to have difficulty with hearing/understanding during the telephone conversation . Reports that she has had a formal eye exam by an eye care professional within the past year . Reports that she has not had a formal hearing evaluation within the past year *Unable to fully assess hearing and vision during telephone visit type  Cognitive Function: 6CIT Screen 07/21/2018  What Year? 0 points  What month? 0 points  What time? 0 points  Count back from 20 0 points  Months in reverse 0 points  Repeat phrase 2 points  Total Score 2    Normal Cognitive Function Screening: Yes (Normal:0-7, Significant for Dysfunction: >8)  Immunization & Health Maintenance Record Immunization History  Administered Date(s) Administered  . Influenza Split 01/17/2010  . Influenza Whole 11/07/2011  . Influenza, High Dose Seasonal PF 12/14/2015, 12/01/2016, 11/23/2017  . Influenza,inj,Quad PF,6+ Mos 11/08/2012, 12/02/2013, 12/28/2014  . Pneumococcal Conjugate-13 05/23/2013  . Pneumococcal Polysaccharide-23 12/19/2007  . Td 02/18/2003  . Tdap 11/04/2010  . Zoster 08/13/2011    Health Maintenance  Topic Date Due  . OPHTHALMOLOGY EXAM  10/18/2016  . INFLUENZA VACCINE  09/18/2018  . HEMOGLOBIN A1C  01/01/2019  . FOOT EXAM  06/18/2019  . MAMMOGRAM  04/26/2020  . TETANUS/TDAP  11/03/2020  . DEXA SCAN  Completed  . PNA vac Low Risk  Adult  Completed       Assessment  This is a routine wellness examination for LYDIA TOREN.  Health Maintenance: Due or Overdue Health Maintenance Due  Topic Date Due  . OPHTHALMOLOGY EXAM  10/18/2016    Veronica Paul does not need a referral for Community Assistance: Care Management:   no Social Work:    no Prescription Assistance:  no Nutrition/Diabetes Education:  no   Plan:  Personalized Goals Goals Addressed            This Visit's Progress   . Patient Stated (pt-stated)       Pt stated she would like to get out more "my kids won't let me leave the house due to this virus and they rarely come in the house"       Personalized Health Maintenance & Screening Recommendations  Diabetic Eye Exam-will schedule in August  Lung Cancer Screening Recommended: no (Low Dose CT Chest recommended if Age 10-80 years, 30 pack-year currently smoking OR have quit w/in past 15 years) Hepatitis C Screening recommended: no HIV Screening recommended: no  Advanced Directives: Written information was not prepared per patient's request.  Referrals & Orders No orders of the defined types were placed in this encounter.   Follow-up Plan . Follow-up with Veronica Pretty, FNP as planned . Schedule Diabetic Eye Exam    I have personally reviewed and noted the following in the patient's chart:   . Medical and social history . Use of alcohol, tobacco or illicit drugs  . Current medications and supplements . Functional ability and status . Nutritional status . Physical activity . Advanced directives . List of other physicians . Hospitalizations, surgeries, and ER visits in previous 12 months . Vitals . Screenings to include cognitive, depression, and falls . Referrals and appointments  In addition, I have reviewed and discussed with Veronica Paul certain preventive protocols, quality metrics, and best practice recommendations. A written personalized care plan for  preventive services as well as general preventive health recommendations is available and can be mailed to the patient at her request.      Marylin Crosby  07/21/2018     I have reviewed and agree with the above AWV documentation.   Evelina Dun, FNP

## 2018-08-02 ENCOUNTER — Telehealth: Payer: Self-pay | Admitting: Nurse Practitioner

## 2018-08-02 MED ORDER — OLMESARTAN MEDOXOMIL 20 MG PO TABS: 20.0000 mg | ORAL_TABLET | Freq: Every day | ORAL | 0 refills | Status: DC

## 2018-08-02 NOTE — Telephone Encounter (Signed)
Please let the patient know that I sent in olmesartan for one 27-month supply to replace his losartan that was out of stock.

## 2018-08-02 NOTE — Telephone Encounter (Signed)
Pt aware of MD feedback and voiced understanding. 

## 2018-08-02 NOTE — Telephone Encounter (Signed)
Need replacement drug for losartan.. [place] is MMM back up

## 2018-08-02 NOTE — Telephone Encounter (Signed)
PT has called states that [facility] is telling her that they can't get in any losartan (COZAAR) 100 MG tablet pt wants to talk to nurse

## 2018-08-04 ENCOUNTER — Telehealth: Payer: Self-pay | Admitting: Nurse Practitioner

## 2018-08-04 ENCOUNTER — Other Ambulatory Visit: Payer: Self-pay | Admitting: Family Medicine

## 2018-08-04 MED ORDER — TELMISARTAN 40 MG PO TABS: 40.0000 mg | ORAL_TABLET | Freq: Every day | ORAL | 0 refills | Status: DC

## 2018-08-04 NOTE — Telephone Encounter (Signed)
Please inform that I have switched her to telmisartan 40 mg daily.  I would like her to continue monitoring her blood pressures very closely as this dose may need to be increased.  Keep follow-up appointment with Ronnald Collum.

## 2018-08-04 NOTE — Telephone Encounter (Signed)
Pt is completely out of medication Please review and advise Olmesartan is too expensive ($141)

## 2018-08-04 NOTE — Telephone Encounter (Signed)
Pt notified of RX Will monitor BPs

## 2018-08-16 ENCOUNTER — Telehealth: Payer: Self-pay | Admitting: Nurse Practitioner

## 2018-08-16 NOTE — Telephone Encounter (Signed)
PCP not available until Friday. Appt made with acute provider for tomorrow.

## 2018-08-16 NOTE — Telephone Encounter (Signed)
Please advise 

## 2018-08-16 NOTE — Telephone Encounter (Signed)
NTBS.

## 2018-08-17 ENCOUNTER — Encounter (HOSPITAL_COMMUNITY): Payer: Self-pay | Admitting: Emergency Medicine

## 2018-08-17 ENCOUNTER — Encounter: Payer: Self-pay | Admitting: Physician Assistant

## 2018-08-17 ENCOUNTER — Emergency Department (HOSPITAL_COMMUNITY): Payer: Medicare HMO

## 2018-08-17 ENCOUNTER — Other Ambulatory Visit: Payer: Self-pay

## 2018-08-17 ENCOUNTER — Observation Stay (HOSPITAL_COMMUNITY)
Admission: EM | Admit: 2018-08-17 | Discharge: 2018-08-18 | Disposition: A | Discharge status: Home / Self Care | Payer: Medicare HMO | Attending: Internal Medicine | Admitting: Internal Medicine

## 2018-08-17 ENCOUNTER — Ambulatory Visit (INDEPENDENT_AMBULATORY_CARE_PROVIDER_SITE_OTHER): Payer: Medicare HMO | Admitting: Physician Assistant

## 2018-08-17 VITALS — BP 144/81 | HR 152 | Temp 97.8°F | Ht 61.0 in | Wt 188.0 lb

## 2018-08-17 DIAGNOSIS — Z888 Allergy status to other drugs, medicaments and biological substances status: Secondary | ICD-10-CM | POA: Insufficient documentation

## 2018-08-17 DIAGNOSIS — R69 Illness, unspecified: Secondary | ICD-10-CM | POA: Diagnosis not present

## 2018-08-17 DIAGNOSIS — I130 Hypertensive heart and chronic kidney disease with heart failure and stage 1 through stage 4 chronic kidney disease, or unspecified chronic kidney disease: Secondary | ICD-10-CM | POA: Insufficient documentation

## 2018-08-17 DIAGNOSIS — E1122 Type 2 diabetes mellitus with diabetic chronic kidney disease: Secondary | ICD-10-CM | POA: Diagnosis not present

## 2018-08-17 DIAGNOSIS — F329 Major depressive disorder, single episode, unspecified: Secondary | ICD-10-CM | POA: Insufficient documentation

## 2018-08-17 DIAGNOSIS — R0789 Other chest pain: Secondary | ICD-10-CM | POA: Diagnosis not present

## 2018-08-17 DIAGNOSIS — E785 Hyperlipidemia, unspecified: Secondary | ICD-10-CM | POA: Insufficient documentation

## 2018-08-17 DIAGNOSIS — R0602 Shortness of breath: Secondary | ICD-10-CM | POA: Diagnosis not present

## 2018-08-17 DIAGNOSIS — Z881 Allergy status to other antibiotic agents status: Secondary | ICD-10-CM | POA: Insufficient documentation

## 2018-08-17 DIAGNOSIS — Z1159 Encounter for screening for other viral diseases: Secondary | ICD-10-CM | POA: Diagnosis not present

## 2018-08-17 DIAGNOSIS — Z882 Allergy status to sulfonamides status: Secondary | ICD-10-CM | POA: Insufficient documentation

## 2018-08-17 DIAGNOSIS — Z7901 Long term (current) use of anticoagulants: Secondary | ICD-10-CM | POA: Diagnosis not present

## 2018-08-17 DIAGNOSIS — Z7982 Long term (current) use of aspirin: Secondary | ICD-10-CM | POA: Insufficient documentation

## 2018-08-17 DIAGNOSIS — Z7984 Long term (current) use of oral hypoglycemic drugs: Secondary | ICD-10-CM | POA: Insufficient documentation

## 2018-08-17 DIAGNOSIS — R9431 Abnormal electrocardiogram [ECG] [EKG]: Secondary | ICD-10-CM

## 2018-08-17 DIAGNOSIS — I10 Essential (primary) hypertension: Secondary | ICD-10-CM | POA: Diagnosis present

## 2018-08-17 DIAGNOSIS — E039 Hypothyroidism, unspecified: Secondary | ICD-10-CM | POA: Insufficient documentation

## 2018-08-17 DIAGNOSIS — F411 Generalized anxiety disorder: Secondary | ICD-10-CM | POA: Insufficient documentation

## 2018-08-17 DIAGNOSIS — R42 Dizziness and giddiness: Secondary | ICD-10-CM | POA: Diagnosis not present

## 2018-08-17 DIAGNOSIS — R000 Tachycardia, unspecified: Secondary | ICD-10-CM

## 2018-08-17 DIAGNOSIS — E876 Hypokalemia: Secondary | ICD-10-CM | POA: Diagnosis not present

## 2018-08-17 DIAGNOSIS — Z79899 Other long term (current) drug therapy: Secondary | ICD-10-CM | POA: Insufficient documentation

## 2018-08-17 DIAGNOSIS — N182 Chronic kidney disease, stage 2 (mild): Secondary | ICD-10-CM | POA: Diagnosis not present

## 2018-08-17 DIAGNOSIS — R002 Palpitations: Secondary | ICD-10-CM

## 2018-08-17 DIAGNOSIS — I4891 Unspecified atrial fibrillation: Secondary | ICD-10-CM | POA: Diagnosis not present

## 2018-08-17 DIAGNOSIS — E119 Type 2 diabetes mellitus without complications: Secondary | ICD-10-CM

## 2018-08-17 LAB — MAGNESIUM: Magnesium: 2.1 mg/dL (ref 1.7–2.4)

## 2018-08-17 LAB — CBC WITH DIFFERENTIAL/PLATELET
Abs Immature Granulocytes: 0.02 10*3/uL (ref 0.00–0.07)
Basophils Absolute: 0.1 10*3/uL (ref 0.0–0.1)
Basophils Relative: 1 %
Eosinophils Absolute: 0.1 10*3/uL (ref 0.0–0.5)
Eosinophils Relative: 1 %
HCT: 46.3 % — ABNORMAL HIGH (ref 36.0–46.0)
Hemoglobin: 14.6 g/dL (ref 12.0–15.0)
Immature Granulocytes: 0 %
Lymphocytes Relative: 21 %
Lymphs Abs: 1.7 10*3/uL (ref 0.7–4.0)
MCH: 28.3 pg (ref 26.0–34.0)
MCHC: 31.5 g/dL (ref 30.0–36.0)
MCV: 89.7 fL (ref 80.0–100.0)
Monocytes Absolute: 0.6 10*3/uL (ref 0.1–1.0)
Monocytes Relative: 8 %
Neutro Abs: 5.6 10*3/uL (ref 1.7–7.7)
Neutrophils Relative %: 69 %
Platelets: 304 10*3/uL (ref 150–400)
RBC: 5.16 MIL/uL — ABNORMAL HIGH (ref 3.87–5.11)
RDW: 13.5 % (ref 11.5–15.5)
WBC: 8.1 10*3/uL (ref 4.0–10.5)
nRBC: 0 % (ref 0.0–0.2)

## 2018-08-17 LAB — BASIC METABOLIC PANEL
Anion gap: 14 (ref 5–15)
BUN: 15 mg/dL (ref 8–23)
CO2: 26 mmol/L (ref 22–32)
Calcium: 9.1 mg/dL (ref 8.9–10.3)
Chloride: 101 mmol/L (ref 98–111)
Creatinine, Ser: 1.21 mg/dL — ABNORMAL HIGH (ref 0.44–1.00)
GFR calc Af Amer: 49 mL/min — ABNORMAL LOW (ref >60)
GFR calc non Af Amer: 42 mL/min — ABNORMAL LOW (ref >60)
Glucose, Bld: 123 mg/dL — ABNORMAL HIGH (ref 70–99)
Potassium: 3.4 mmol/L — ABNORMAL LOW (ref 3.5–5.1)
Sodium: 141 mmol/L (ref 135–145)

## 2018-08-17 LAB — TSH: TSH: 0.313 u[IU]/mL — ABNORMAL LOW (ref 0.350–4.500)

## 2018-08-17 LAB — SARS CORONAVIRUS 2 BY RT PCR (HOSPITAL ORDER, PERFORMED IN ~~LOC~~ HOSPITAL LAB): SARS Coronavirus 2: NEGATIVE

## 2018-08-17 LAB — GLUCOSE, CAPILLARY
Glucose-Capillary: 101 mg/dL — ABNORMAL HIGH (ref 70–99)
Glucose-Capillary: 105 mg/dL — ABNORMAL HIGH (ref 70–99)

## 2018-08-17 LAB — MRSA PCR SCREENING: MRSA by PCR: NEGATIVE

## 2018-08-17 LAB — TROPONIN I (HIGH SENSITIVITY)
Troponin I (High Sensitivity): 5 ng/L (ref <18)
Troponin I (High Sensitivity): 6 ng/L (ref <18)

## 2018-08-17 LAB — BRAIN NATRIURETIC PEPTIDE: B Natriuretic Peptide: 402 pg/mL — ABNORMAL HIGH (ref 0.0–100.0)

## 2018-08-17 MED ORDER — SODIUM CHLORIDE 0.9 % IV BOLUS
500.0000 mL | Freq: Once | INTRAVENOUS | Status: AC
  Administered 2018-08-17: 500 mL via INTRAVENOUS

## 2018-08-17 MED ORDER — METOPROLOL SUCCINATE ER 50 MG PO TB24
300.0000 mg | ORAL_TABLET | Freq: Every day | ORAL | Status: DC
  Administered 2018-08-18: 11:00:00 300 mg via ORAL
  Filled 2018-08-17: qty 6
  Filled 2018-08-17: qty 3

## 2018-08-17 MED ORDER — SIMVASTATIN 20 MG PO TABS: 40.0000 mg | ORAL_TABLET | ORAL | Status: DC

## 2018-08-17 MED ORDER — SODIUM CHLORIDE 0.9 % IV BOLUS
250.0000 mL | Freq: Once | INTRAVENOUS | Status: AC
  Administered 2018-08-17: 250 mL via INTRAVENOUS

## 2018-08-17 MED ORDER — APIXABAN 5 MG PO TABS
5.0000 mg | ORAL_TABLET | Freq: Two times a day (BID) | ORAL | Status: DC
  Administered 2018-08-17 – 2018-08-18 (×2): 5 mg via ORAL
  Filled 2018-08-17 (×2): qty 1

## 2018-08-17 MED ORDER — DILTIAZEM HCL 100 MG IV SOLR
5.0000 mg/h | INTRAVENOUS | Status: DC
  Administered 2018-08-17: 5 mg/h via INTRAVENOUS
  Administered 2018-08-17: 17:00:00 15 mg/h via INTRAVENOUS
  Filled 2018-08-17 (×2): qty 100

## 2018-08-17 MED ORDER — CLONAZEPAM 0.5 MG PO TABS: 0.5000 mg | ORAL_TABLET | Freq: Two times a day (BID) | ORAL | Status: DC | PRN

## 2018-08-17 MED ORDER — ONDANSETRON HCL 4 MG/2ML IJ SOLN: 4.0000 mg | Freq: Four times a day (QID) | INTRAMUSCULAR | Status: DC | PRN

## 2018-08-17 MED ORDER — DILTIAZEM HCL 25 MG/5ML IV SOLN
15.0000 mg | Freq: Once | INTRAVENOUS | Status: AC
  Administered 2018-08-17: 15 mg via INTRAVENOUS

## 2018-08-17 MED ORDER — LEVOTHYROXINE SODIUM 75 MCG PO TABS: 75.0000 ug | ORAL_TABLET | Freq: Every day | ORAL | Status: DC

## 2018-08-17 MED ORDER — DILTIAZEM HCL 30 MG PO TABS
30.0000 mg | ORAL_TABLET | Freq: Four times a day (QID) | ORAL | Status: DC
  Administered 2018-08-17 – 2018-08-18 (×3): 30 mg via ORAL
  Filled 2018-08-17 (×3): qty 1

## 2018-08-17 MED ORDER — DILTIAZEM LOAD VIA INFUSION
10.0000 mg | Freq: Once | INTRAVENOUS | Status: AC
  Administered 2018-08-17: 10 mg via INTRAVENOUS
  Filled 2018-08-17: qty 10

## 2018-08-17 MED ORDER — ACETAMINOPHEN 325 MG PO TABS: 650.0000 mg | ORAL_TABLET | ORAL | Status: DC | PRN

## 2018-08-17 MED ORDER — POTASSIUM CHLORIDE 20 MEQ/15ML (10%) PO SOLN
40.0000 meq | Freq: Once | ORAL | Status: AC
  Administered 2018-08-17: 40 meq via ORAL
  Filled 2018-08-17: qty 30

## 2018-08-17 MED ORDER — LEVOTHYROXINE SODIUM 25 MCG PO TABS
50.0000 ug | ORAL_TABLET | Freq: Every day | ORAL | Status: DC
  Administered 2018-08-18: 50 ug via ORAL
  Filled 2018-08-17: qty 2

## 2018-08-17 NOTE — ED Provider Notes (Signed)
[facility] EMERGENCY DEPARTMENT Provider Note   CSN: [id number] Arrival date & time: 08/17/18  1212     History   Chief Complaint Chief Complaint  Patient presents with   Atrial Fibrillation    HPI Veronica Paul is a 83 y.o. female.      Atrial Fibrillation    Pt was seen at 1225. Per pt, c/o gradual onset and persistence of constant palpitations for the past 4 days. Has been associated with "dizziness," described as "lightheadedness" especially when changing positions. Has also been associated with SOB and generalized chest "tightness," worsening when she walks. Pt was evaluated by her PMD PTA, then sent to the ED after EKG showed new onset afib/RVR. Denies cough, no back pain, no abd pain, no N/V/D, no syncope, no focal motor weakness, no fevers, no calf/LE pain or unilateral swelling.   Past Medical History:  Diagnosis Date   Depression    Diabetes mellitus    Dyslipidemia    HTN (hypertension)    Hypothyroidism    Lung cancer ([place])    Obesity    Osteopenia    Osteopenia    Vitamin D deficiency     Patient Active Problem List   Diagnosis Date Noted   Hypothyroidism 05/23/2013   Diabetes mellitus without complication ([place]) 95/62/1308   GAD (generalized anxiety disorder) 02/09/2013   Depression 06/25/2012   Peripheral edema 06/25/2012   Osteopenia    Hypertension    Abnormal EKG    Hyperlipidemia    BMI 34.0-34.9,adult     Past Surgical History:  Procedure Laterality Date   LUNG REMOVAL, PARTIAL  1998   Right   THYROIDECTOMY       OB History   No obstetric history on file.      Home Medications    Prior to Admission medications   Medication Sig Start Date End Date Taking? Authorizing Provider  amLODipine (NORVASC) 5 MG tablet Take 1 tablet (5 mg total) by mouth daily. 07/01/18  Yes Hassell Done, Mary-Margaret, FNP  aspirin EC 81 MG EC tablet Take 81 mg by mouth daily.     Yes [provider]  BLACK COHOSH PO Take by  mouth.     Yes [provider]  clonazePAM (KLONOPIN) 0.5 MG tablet Take 1 tablet (0.5 mg total) by mouth 2 (two) times daily as needed. 07/01/18  Yes Martin, Mary-Margaret, FNP  fish oil-omega-3 fatty acids 1000 MG capsule Take 2 g by mouth daily.     Yes [provider]  levothyroxine (SYNTHROID) 75 MCG tablet Take 1 tablet (75 mcg total) by mouth daily. 07/01/18 07/01/19 Yes Martin, Mary-Margaret, FNP  metFORMIN (GLUCOPHAGE) 500 MG tablet Take 1 tablet (500 mg total) by mouth daily. 07/01/18  Yes Hassell Done, Mary-Margaret, FNP  metoprolol (TOPROL-XL) 200 MG 24 hr tablet TAKE 1 AND 1/2 TABLET DAILY 07/01/18  Yes Hassell Done, Mary-Margaret, FNP  nitroGLYCERIN (NITROSTAT) 0.4 MG SL tablet Place 1 tablet (0.4 mg total) under the tongue every 5 (five) minutes as needed for chest pain. 05/15/17  Yes Martin, Mary-Margaret, FNP  simvastatin (ZOCOR) 40 MG tablet Take 1 tablet (40 mg total) by mouth every evening. Patient taking differently: Take 40 mg by mouth every other day.  07/01/18 07/01/19 Yes Martin, Mary-Margaret, FNP  telmisartan (MICARDIS) 40 MG tablet Take 1 tablet (40 mg total) by mouth daily. 08/04/18  Yes Gottschalk, Leatrice Jewels M, DO  furosemide (LASIX) 40 MG tablet Take 1 tablet (40 mg total) by mouth daily. Patient not taking: Reported on  08/17/2018 07/01/18   Hassell Done, Mary-Margaret, FNP  triamcinolone (KENALOG) 0.025 % ointment Apply 1 application topically 2 (two) times daily. Patient not taking: Reported on 08/17/2018 05/20/18   Chevis Pretty, FNP    Family History Family History  Problem Relation Age of Onset   Coronary artery disease Brother 17   Hypertension Brother    Heart disease Brother    Hypertension Mother    Hypertension Father    Heart disease Father    Hypertension Sister    Heart disease Sister    Arthritis Daughter    COPD Daughter    Fibromyalgia Daughter    Stroke Maternal Grandmother    Cancer Brother    Hypertension Sister    Colon cancer  Neg Hx    Food intolerance Neg Hx     Social History Social History   Tobacco Use   Smoking status: Never Smoker   Smokeless tobacco: Never Used  Substance Use Topics   Alcohol use: No   Drug use: No     Allergies   Ace inhibitors, Feldene [piroxicam], Meloxicam, Prevnar [pneumococcal 13-val conj vacc], Statins, Sulfa antibiotics, and Zithromax [azithromycin dihydrate]   Review of Systems Review of Systems ROS: Statement: All systems negative except as marked or noted in the HPI; Constitutional: Negative for fever and chills. ; ; Eyes: Negative for eye pain, redness and discharge. ; ; ENMT: Negative for ear pain, hoarseness, nasal congestion, sinus pressure and sore throat. ; ; Cardiovascular: +chest pain, palpitations, dyspnea. Negative for diaphoresis and peripheral edema. ; ; Respiratory: Negative for cough, wheezing and stridor. ; ; Gastrointestinal: Negative for nausea, vomiting, diarrhea, abdominal pain, blood in stool, hematemesis, jaundice and rectal bleeding. . ; ; Genitourinary: Negative for dysuria, flank pain and hematuria. ; ; Musculoskeletal: Negative for back pain and neck pain. Negative for swelling and trauma.; ; Skin: Negative for pruritus, rash, abrasions, blisters, bruising and skin lesion.; ; Neuro: +lightheadedness. Negative for headache and neck stiffness. Negative for weakness, altered level of consciousness, altered mental status, extremity weakness, paresthesias, involuntary movement, seizure and syncope.       Physical Exam Updated Vital Signs BP 116/83    Pulse (!) 135    Temp 97.8 F (36.6 C) (Oral)    Resp (!) 25    Ht 5\' 3"  (1.6 m)    Wt 85.3 kg    LMP 05/11/1992    SpO2 97%    BMI 33.30 kg/m    Patient Vitals for the past 24 hrs:  BP Temp Temp src Pulse Resp SpO2 Height Weight  08/17/18 1430 116/83 -- -- (!) 135 (!) 25 97 % -- --  08/17/18 1415 124/87 -- -- (!) 110 (!) 28 98 % -- --  08/17/18 1400 108/65 -- -- (!) 121 (!) 25 95 % -- --   08/17/18 1345 96/79 -- -- (!) 115 (!) 24 98 % -- --  08/17/18 1330 94/77 -- -- (!) 137 (!) 25 96 % -- --  08/17/18 1315 109/82 -- -- (!) 135 (!) 25 96 % -- --  08/17/18 1305 101/69 -- -- (!) 129 (!) 26 97 % -- --  08/17/18 1300 104/77 -- -- (!) 113 (!) 24 96 % -- --  08/17/18 1245 111/79 -- -- (!) 143 (!) 26 97 % -- --  08/17/18 1242 105/84 -- -- (!) 134 (!) 28 96 % -- --  08/17/18 1219 (!) 128/95 97.8 F (36.6 C) Oral (!) 147 (!) 21 99 % 5\' 3"  (1.6 m)  85.3 kg   12:47 Orthostatic Vital Signs TH  Orthostatic Lying   BP- Lying: 105/84  Pulse- Lying: 140      Orthostatic Sitting  BP- Sitting: 124/95Abnormal   Pulse- Sitting: 146      Orthostatic Standing at 0 minutes  BP- Standing at 0 minutes: 111/79  Pulse- Standing at 0 minutes: 157     Physical Exam 1230: Physical examination:  Nursing notes reviewed; Vital signs and O2 SAT reviewed;  Constitutional: Well developed, Well nourished, Well hydrated, In no acute distress; Head:  Normocephalic, atraumatic; Eyes: EOMI, PERRL, No scleral icterus; ENMT: Mouth and pharynx normal, Mucous membranes moist; Neck: Supple, Full range of motion, No lymphadenopathy; Cardiovascular: Irregular tachycardic rate and rhythm, No gallop; Respiratory: Breath sounds clear & equal bilaterally, No wheezes.  Speaking full sentences with ease, Normal respiratory effort/excursion; Chest: Nontender, Movement normal; Abdomen: Soft, Nontender, Nondistended, Normal bowel sounds; Genitourinary: No CVA tenderness; Extremities: Peripheral pulses normal, No tenderness, No edema, No calf edema or asymmetry.; Neuro: AA&Ox3, Major CN grossly intact.  Speech clear. No gross focal motor or sensory deficits in extremities.; Skin: Color normal, Warm, Dry.   ED Treatments / Results  Labs (all labs ordered are listed, but only abnormal results are displayed)   EKG EKG Interpretation  Date/Time:  Tuesday August 17 2018 12:17:24 EDT Ventricular Rate:  150 PR Interval:     QRS Duration: 107 QT Interval:  317 QTC Calculation: 501 R Axis:   83 Text Interpretation:  Atrial fibrillation with rapid V-rate Borderline right axis deviation Repolarization abnormality, prob rate related When compared with ECG of 10/17/1997 Atrial fibrillation has replaced Normal sinus rhythm Confirmed by Francine Graven [phone]) on 08/17/2018 12:33:30 PM   Radiology   Procedures Procedures (including critical care time)  Medications Ordered in ED Medications  diltiazem (CARDIZEM) 1 mg/mL load via infusion 10 mg (10 mg Intravenous Bolus from Bag 08/17/18 1243)    And  diltiazem (CARDIZEM) 100 mg in dextrose 5 % 100 mL (1 mg/mL) infusion (15 mg/hr Intravenous Rate/Dose Change 08/17/18 1434)  sodium chloride 0.9 % bolus 250 mL (0 mLs Intravenous Stopped 08/17/18 1339)  sodium chloride 0.9 % bolus 500 mL (500 mLs Intravenous New Bag/Given 08/17/18 1341)  diltiazem (CARDIZEM) injection 15 mg (15 mg Intravenous Given 08/17/18 1342)     Initial Impression / Assessment and Plan / ED Course  I have reviewed the triage vital signs and the nursing notes.  Pertinent labs & imaging results that were available during my care of the patient were reviewed by me and considered in my medical decision making (see chart for details).     MDM Reviewed: previous chart, nursing note and vitals Reviewed previous: labs and ECG Interpretation: labs, ECG and x-ray Total time providing critical care: 30-74 minutes. This excludes time spent performing separately reportable procedures and services. Consults: admitting MD   CRITICAL CARE Performed by: Francine Graven Total critical care time: 45 minutes Critical care time was exclusive of separately billable procedures and treating other patients. Critical care was necessary to treat or prevent imminent or life-threatening deterioration. Critical care was time spent personally by me on the following activities: development of treatment plan with  patient and/or surrogate as well as nursing, discussions with consultants, evaluation of patient's response to treatment, examination of patient, obtaining history from patient or surrogate, ordering and performing treatments and interventions, ordering and review of laboratory studies, ordering and review of radiographic studies, pulse oximetry and re-evaluation of patient's condition.  Results for orders placed or performed during the hospital encounter of 23/53/61  Basic metabolic panel  Result Value Ref Range   Sodium 141 135 - 145 mmol/L   Potassium 3.4 (L) 3.5 - 5.1 mmol/L   Chloride 101 98 - 111 mmol/L   CO2 26 22 - 32 mmol/L   Glucose, Bld 123 (H) 70 - 99 mg/dL   BUN 15 8 - 23 mg/dL   Creatinine, Ser 1.21 (H) 0.44 - 1.00 mg/dL   Calcium 9.1 8.9 - 10.3 mg/dL   GFR calc non Af Amer 42 (L) >60 mL/min   GFR calc Af Amer 49 (L) >60 mL/min   Anion gap 14 5 - 15  Brain natriuretic peptide  Result Value Ref Range   B Natriuretic Peptide 402.0 (H) 0.0 - 100.0 pg/mL  Troponin I (High Sensitivity)  Result Value Ref Range   Troponin I (High Sensitivity) 5.00 <18 ng/L  CBC with Differential  Result Value Ref Range   WBC 8.1 4.0 - 10.5 K/uL   RBC 5.16 (H) 3.87 - 5.11 MIL/uL   Hemoglobin 14.6 12.0 - 15.0 g/dL   HCT 46.3 (H) 36.0 - 46.0 %   MCV 89.7 80.0 - 100.0 fL   MCH 28.3 26.0 - 34.0 pg   MCHC 31.5 30.0 - 36.0 g/dL   RDW 13.5 11.5 - 15.5 %   Platelets 304 150 - 400 K/uL   nRBC 0.0 0.0 - 0.2 %   Neutrophils Relative % 69 %   Neutro Abs 5.6 1.7 - 7.7 K/uL   Lymphocytes Relative 21 %   Lymphs Abs 1.7 0.7 - 4.0 K/uL   Monocytes Relative 8 %   Monocytes Absolute 0.6 0.1 - 1.0 K/uL   Eosinophils Relative 1 %   Eosinophils Absolute 0.1 0.0 - 0.5 K/uL   Basophils Relative 1 %   Basophils Absolute 0.1 0.0 - 0.1 K/uL   Immature Granulocytes 0 %   Abs Immature Granulocytes 0.02 0.00 - 0.07 K/uL   Dg Chest Port 1 View  Result Date: 08/17/2018 CLINICAL DATA:  Cardiac palpitations  EXAM: PORTABLE CHEST 1 VIEW COMPARISON:  10/26/2017 FINDINGS: Cardiac shadow is stable. Mild scarring in the right base is noted. No focal infiltrate or sizable effusion is seen. No bony abnormality is noted. IMPRESSION: No acute abnormality noted. Electronically Signed   By: Inez Catalina M.D.   On: 08/17/2018 13:27    Veronica Paul was evaluated in Emergency Department on 08/17/2018 for the symptoms described in the history of present illness. She was evaluated in the context of the global COVID-19 pandemic, which necessitated consideration that the patient might be at risk for infection with the SARS-CoV-2 virus that causes COVID-19. Institutional protocols and algorithms that pertain to the evaluation of patients at risk for COVID-19 are in a state of rapid change based on information released by regulatory bodies including the CDC and federal and state organizations. These policies and algorithms were followed during the patient's care in the ED.   This patients CHA2DS2-VASc Score and unadjusted Ischemic Stroke Rate (% per year) is equal to 7.2 % stroke rate/year from a score of 5 Above score calculated as 1 point each if present [CHF, HTN, DM, Vascular=MI/PAD/Aortic Plaque, Age if 65-74, or Female] Above score calculated as 2 points each if present [Age > 75, or Stroke/TIA/TE]     1450:  IV cardizem bolus x2 and gtt started for new onset afib/RVR with rates into 150's. HR now 100-120's. BP soft after  IV cardizem bolus; but improved with judicious IVF bolus.   1500:  T/C returned from [facility] Posey Pronto, case discussed, including:  HPI, pertinent PM/SHx, VS/PE, dx testing, ED course and treatment:  Agreeable to admit.       Final Clinical Impressions(s) / ED Diagnoses   Final diagnoses:  None    ED Discharge Orders    None       Francine Graven, DO 08/18/18 2820

## 2018-08-17 NOTE — ED Notes (Signed)
Patient's daughter Jenny Reichmann updated.

## 2018-08-17 NOTE — ED Triage Notes (Signed)
Patient has been experiencing dizziness and rapid heart rate since Friday. She went to her PCP today and their EKG showed new onset AFIB with RVR.

## 2018-08-17 NOTE — H&P (Addendum)
History and Physical    Veronica Paul YNW:[id number] DOB: 07/22/1935 DOA: 08/17/2018  PCP: Chevis Pretty, FNP  Patient coming from: PCP office  I have personally briefly reviewed patient's old medical records in [place]  Chief Complaint: Palpitations  HPI: Veronica Paul is a 83 y.o. female with medical history significant for type 2 diabetes, hypertension, hyperlipidemia, hypothyroidism, CKD stage II, depression, and anxiety who presents the ED for evaluation of palpitations/A. fib with RVR.  Patient reports about 2-3 days of new onset central chest tightness and palpitations.  She thought this was related to change in her losartan to telmisartan which she had been taking for about more than a week.  She has also experienced lightheadedness with exertion and intermittent dyspnea.  She was seen by her PCP today 08/17/2018.  An EKG was performed which showed tachycardia with rate in the 160s, probable atrial fibrillation with RVR.  She was subsequently sent to the ED for further evaluation management.  Patient denies any personal known history of CHF, MI, or arrhythmia.  She currently denies any nausea, vomiting, cough, abdominal pain, dysuria, or diarrhea.  She denies any obvious bleeding including epistaxis, hemoptysis, hematemesis, hematuria, or hematochezia.  ED Course:  Initial vitals showed BP 144/81, pulse 152, RR 24, temp 97.8 Fahrenheit, SPO2 96% on room air.  Labs are notable for WBC 8.1, hemoglobin 14.6, platelets 304,000, potassium 3.4, bicarb 26, BUN 15, creatinine 1.21, serum glucose 123, BNP 402, high-sensitivity troponin I 5.0.  SARS-CoV-2 test was obtained and pending.  EKG showed A. fib with RVR, rate 150.  A. fib is new when compared to prior from 2016.  Portable chest x-ray was negative for focal consolidation or significant effusion.  Patient was given IV diltiazem 10 mg followed by 15 mg and started on a diltiazem infusion.  She had transient  hypotension with SBP in the 90s which improved after receiving 750 ccs normal saline.  The hospitalist service was consulted to admit for further evaluation and management.   Review of Systems: All systems reviewed and are negative except as documented in history of present illness above.   Past Medical History:  Diagnosis Date  . Depression   . Diabetes mellitus   . Dyslipidemia   . HTN (hypertension)   . Hypothyroidism   . Lung cancer ([place])   . Obesity   . Osteopenia   . Osteopenia   . Vitamin D deficiency     Past Surgical History:  Procedure Laterality Date  . LUNG REMOVAL, PARTIAL  1998   Right  . THYROIDECTOMY      Social History:  reports that she has never smoked. She has never used smokeless tobacco. She reports that she does not drink alcohol or use drugs.  Allergies  Allergen Reactions  . Ace Inhibitors Cough  . Feldene [Piroxicam]     REACTION: RASH TO HANDS  . Meloxicam Nausea And Vomiting  . Prevnar [Pneumococcal 13-Val Conj Vacc]   . Statins Other (See Comments)    myalgia  . Sulfa Antibiotics     Rash   . Zithromax [Azithromycin Dihydrate] Itching    Family History  Problem Relation Age of Onset  . Coronary artery disease Brother 72  . Hypertension Brother   . Heart disease Brother   . Hypertension Mother   . Hypertension Father   . Heart disease Father   . Hypertension Sister   . Heart disease Sister   . Arthritis Daughter   . COPD Daughter   .  Fibromyalgia Daughter   . Stroke Maternal Grandmother   . Cancer Brother   . Hypertension Sister   . Colon cancer Neg Hx   . Food intolerance Neg Hx      Prior to Admission medications   Medication Sig Start Date End Date Taking? Authorizing Provider  amLODipine (NORVASC) 5 MG tablet Take 1 tablet (5 mg total) by mouth daily. 07/01/18  Yes Hassell Done, Mary-Margaret, FNP  aspirin EC 81 MG EC tablet Take 81 mg by mouth daily.     Yes [provider]  BLACK COHOSH PO Take by mouth.     Yes  [provider]  clonazePAM (KLONOPIN) 0.5 MG tablet Take 1 tablet (0.5 mg total) by mouth 2 (two) times daily as needed. 07/01/18  Yes Martin, Mary-Margaret, FNP  fish oil-omega-3 fatty acids 1000 MG capsule Take 2 g by mouth daily.     Yes [provider]  levothyroxine (SYNTHROID) 75 MCG tablet Take 1 tablet (75 mcg total) by mouth daily. 07/01/18 07/01/19 Yes Martin, Mary-Margaret, FNP  metFORMIN (GLUCOPHAGE) 500 MG tablet Take 1 tablet (500 mg total) by mouth daily. 07/01/18  Yes Hassell Done, Mary-Margaret, FNP  metoprolol (TOPROL-XL) 200 MG 24 hr tablet TAKE 1 AND 1/2 TABLET DAILY 07/01/18  Yes Hassell Done, Mary-Margaret, FNP  nitroGLYCERIN (NITROSTAT) 0.4 MG SL tablet Place 1 tablet (0.4 mg total) under the tongue every 5 (five) minutes as needed for chest pain. 05/15/17  Yes Martin, Mary-Margaret, FNP  simvastatin (ZOCOR) 40 MG tablet Take 1 tablet (40 mg total) by mouth every evening. Patient taking differently: Take 40 mg by mouth every other day.  07/01/18 07/01/19 Yes Martin, Mary-Margaret, FNP  telmisartan (MICARDIS) 40 MG tablet Take 1 tablet (40 mg total) by mouth daily. 08/04/18  Yes Gottschalk, Leatrice Jewels M, DO  furosemide (LASIX) 40 MG tablet Take 1 tablet (40 mg total) by mouth daily. Patient not taking: Reported on 08/17/2018 07/01/18   Chevis Pretty, FNP  triamcinolone (KENALOG) 0.025 % ointment Apply 1 application topically 2 (two) times daily. Patient not taking: Reported on 08/17/2018 05/20/18   Chevis Pretty, FNP    Physical Exam: Vitals:   08/17/18 1445 08/17/18 1500 08/17/18 1515 08/17/18 1530  BP: (!) 125/91 127/85 (!) 122/91 (!) 142/84  Pulse: (!) 120 (!) 122 (!) 35 (!) 127  Resp: (!) 29 (!) 25 (!) 26 (!) 26  Temp:      TempSrc:      SpO2: 97% 98% 97% 100%  Weight:      Height:        Constitutional: Elderly woman resting supine in bed, NAD, calm, comfortable Eyes: PERRL, lids and conjunctivae normal ENMT: Mucous membranes are moist. Posterior pharynx  clear of any exudate or lesions.Normal dentition.  Neck: normal, supple, no masses. Respiratory: clear to auscultation bilaterally, no wheezing, no crackles. Normal respiratory effort. No accessory muscle use.  Cardiovascular: Irregularly irregular and tachycardic. No extremity edema. 2+ pedal pulses. Abdomen: no tenderness, no masses palpated. No hepatosplenomegaly. Bowel sounds positive.  Musculoskeletal: no clubbing / cyanosis. No joint deformity upper and lower extremities. Good ROM, no contractures. Normal muscle tone.  Skin: no rashes, lesions, ulcers. No induration Neurologic: CN 2-12 grossly intact. Sensation intact, Strength 5/5 in all 4.  Psychiatric: Normal judgment and insight. Alert and oriented x 3. Normal mood.     Labs on Admission: I have personally reviewed following labs and imaging studies  CBC: Recent Labs  Lab 08/17/18 1232  WBC 8.1  NEUTROABS 5.6  HGB 14.6  HCT 46.3*  MCV 89.7  PLT 790   Basic Metabolic Panel: Recent Labs  Lab 08/17/18 1232  NA 141  K 3.4*  CL 101  CO2 26  GLUCOSE 123*  BUN 15  CREATININE 1.21*  CALCIUM 9.1   GFR: Estimated Creatinine Clearance: 38.4 mL/min (A) (by C-G formula based on SCr of 1.21 mg/dL (H)). Liver Function Tests: No results for input(s): AST, ALT, ALKPHOS, BILITOT, PROT, ALBUMIN in the last 168 hours. No results for input(s): LIPASE, AMYLASE in the last 168 hours. No results for input(s): AMMONIA in the last 168 hours. Coagulation Profile: No results for input(s): INR, PROTIME in the last 168 hours. Cardiac Enzymes: No results for input(s): CKTOTAL, CKMB, CKMBINDEX, TROPONINI in the last 168 hours. BNP (last 3 results) No results for input(s): PROBNP in the last 8760 hours. HbA1C: No results for input(s): HGBA1C in the last 72 hours. CBG: No results for input(s): GLUCAP in the last 168 hours. Lipid Profile: No results for input(s): CHOL, HDL, LDLCALC, TRIG, CHOLHDL, LDLDIRECT in the last 72 hours.  Thyroid Function Tests: No results for input(s): TSH, T4TOTAL, FREET4, T3FREE, THYROIDAB in the last 72 hours. Anemia Panel: No results for input(s): VITAMINB12, FOLATE, FERRITIN, TIBC, IRON, RETICCTPCT in the last 72 hours. Urine analysis:    Component Value Date/Time   BILIRUBINUR neg 05/11/2012 1019   PROTEINUR neg 05/11/2012 1019   UROBILINOGEN negative 05/11/2012 1019   NITRITE neg 05/11/2012 1019   LEUKOCYTESUR Negative 05/11/2012 1019    Radiological Exams on Admission: Dg Chest Port 1 View  Result Date: 08/17/2018 CLINICAL DATA:  Cardiac palpitations EXAM: PORTABLE CHEST 1 VIEW COMPARISON:  10/26/2017 FINDINGS: Cardiac shadow is stable. Mild scarring in the right base is noted. No focal infiltrate or sizable effusion is seen. No bony abnormality is noted. IMPRESSION: No acute abnormality noted. Electronically Signed   By: Inez Catalina M.D.   On: 08/17/2018 13:27    EKG: Independently reviewed. A. fib with RVR, rate 150.  A. fib is new when compared to prior from 2016.  Assessment/Plan Principal Problem:   Atrial fibrillation with RVR (HCC) Active Problems:   Hypertension   Hyperlipidemia   Diabetes mellitus without complication (HCC)   GAD (generalized anxiety disorder)   Hypothyroidism   Hypokalemia  Veronica Paul is a 83 y.o. female with medical history significant for type 2 diabetes, hypertension, hyperlipidemia, hypothyroidism, CKD stage II, depression, and anxiety who is admitted with A. fib RVR.   Atrial fibrillation with RVR: New onset with symptoms beginning approximately 3 days prior to admission. Her CHA2DS2-VASc Score and unadjusted Ischemic Stroke Rate (% per year) is equal to 7.2 % stroke rate/year from a score of at least 5.  I discussed the risks/benefits of anticoagulation and she is agreeable to start oral anticoagulation. -Continue diltiazem drip -Add oral diltiazem 30 mg q6h, try to wean off drip as able -Discontinue home amlodipine -Resume home  Toprol-XL 300 mg daily tomorrow a.m. if BP tolerates -Check TSH -Obtain echocardiogram -Check magnesium level, replete potassium Addendum: TSH 0.313, will decrease home Synthroid from 75 to 50 mcg daily  Hypokalemia: -Oral repletion ordered -Check magnesium  Hypertension: -On diltiazem as above -DC home amlodipine -Holding telmisartan -resume Toprol-XL as BP tolerates for additional rate control  Type 2 diabetes: Last A1c 5.7 07/01/2018.  She is on metformin 500 mg daily. -Hold metformin while in hospital -Monitor CBGs  CKD stage II: GFR slightly decreased from baseline between 53-57, likely from decreased perfusion in setting  of A. fib RVR.  Will recheck in a.m., received fluids in the ED.  Hyperlipidemia: On simvastatin every other day due to reported history of myalgias. -Continue simvastatin  Hypothyroidism: -Continue Synthroid -Check TSH Addendum: TSH 0.313, will decrease home Synthroid from 75 to 50 mcg daily  Generalized anxiety disorder: -Continue clonazepam 0.5 mg twice daily only as needed   DVT prophylaxis: Eliquis Code Status: Full code, confirmed with patient Family Communication: None present on admission Disposition Plan: Pending clinical progress Consults called: None Admission status: Observation   Zada Finders MD [facility]  If 7PM-7AM, please contact night-coverage [email or web address]  08/17/2018, 3:58 PM

## 2018-08-17 NOTE — Progress Notes (Signed)
BP (!) 144/81   Pulse (!) 152   Temp 97.8 F (36.6 C) (Oral)   Ht 5' 1" (1.549 m)   Wt 188 lb (85.3 kg)   LMP 05/11/1992   BMI 35.52 kg/m    Subjective:    Patient ID: Veronica Paul, female    DOB: February 24, 1935, 83 y.o.   MRN: [id number]  HPI: Veronica Paul is a 83 y.o. female presenting on 08/17/2018 for Dizziness, Palpitations, and Chest Pain  This patient is seen on an urgent day by me.  She comes into the office complaining of dizziness and palpitations, at the worst over the last 2 to 3 days.  She is not having chest pain per se.  She states that she has never had an MI in the past.  Recently she did have to have her ARB changed from Benicar to telmisartan.  She has been taking this medication for a little more than a week.  So she is wondering if that caused the symptoms that she is having.  When she was on the table and on the monitor her she was found to have tachycardia at 152, blood pressure was 144/81  EKG was then performed and showed also tachycardia consistently in the 160s.  But then it also was showing some possible atrial fibrillation.  She has never had this problem before.  She has no family history of it.  She states she has never had to have her heart shocked.  She has never had to take strong blood thinners.  I have explained to her that this is a very concerning situation and she needs to go to the emergency room to have some testing and medications to control her heart rate and see if this will calm down the situation that is going on.  She agrees and she will be transported to [facility].   Past Medical History:  Diagnosis Date  . Depression   . Diabetes mellitus   . Dyslipidemia   . HTN (hypertension)   . Hypothyroidism   . Lung cancer ([place])   . Obesity   . Osteopenia   . Osteopenia   . Vitamin D deficiency    Relevant past medical, surgical, family and social history reviewed and updated as indicated. Interim medical history since our last visit  reviewed. Allergies and medications reviewed and updated. DATA REVIEWED: CHART IN EPIC  Family History reviewed for pertinent findings.  Review of Systems  Constitutional: Positive for fatigue. Negative for chills and diaphoresis.  HENT: Negative.   Eyes: Negative.   Respiratory: Negative.  Negative for shortness of breath and wheezing.   Cardiovascular: Positive for chest pain and palpitations. Negative for leg swelling.  Gastrointestinal: Negative.   Genitourinary: Negative.   Neurological: Positive for weakness.  Psychiatric/Behavioral: Negative.     Allergies as of 08/17/2018      Reactions   Ace Inhibitors Cough   Feldene [piroxicam]    REACTION: RASH TO HANDS   Meloxicam Nausea And Vomiting   Prevnar [pneumococcal 13-val Conj Vacc]    Statins Other (See Comments)   myalgia   Sulfa Antibiotics    Rash   Zithromax [azithromycin Dihydrate] Itching      Medication List       Accurate as of August 17, 2018 11:41 AM. If you have any questions, ask your nurse or doctor.        STOP taking these medications   olmesartan 20 MG tablet Commonly known as: [employer]  Stopped by: Terald Sleeper, PA-C     TAKE these medications   amLODipine 5 MG tablet Commonly known as: NORVASC Take 1 tablet (5 mg total) by mouth daily.   aspirin EC 81 MG tablet Take 81 mg by mouth daily.   BLACK COHOSH PO Take by mouth.   clonazePAM 0.5 MG tablet Commonly known as: KLONOPIN Take 1 tablet (0.5 mg total) by mouth 2 (two) times daily as needed.   diphenhydramine-acetaminophen 25-500 MG Tabs tablet Commonly known as: TYLENOL PM Take 1 tablet by mouth at bedtime as needed.   fish oil-omega-3 fatty acids 1000 MG capsule Take 2 g by mouth daily.   furosemide 40 MG tablet Commonly known as: LASIX Take 1 tablet (40 mg total) by mouth daily.   glucose blood test strip Check BS BID and PRN . DX.E11.9   levothyroxine 75 MCG tablet Commonly known as: Synthroid Take 1 tablet (75 mcg  total) by mouth daily.   metFORMIN 500 MG tablet Commonly known as: GLUCOPHAGE Take 1 tablet (500 mg total) by mouth daily.   metoprolol 200 MG 24 hr tablet Commonly known as: TOPROL-XL TAKE 1 AND 1/2 TABLET DAILY   nitroGLYCERIN 0.4 MG SL tablet Commonly known as: Nitrostat Place 1 tablet (0.4 mg total) under the tongue every 5 (five) minutes as needed for chest pain.   onetouch ultrasoft lancets Check BS BID and PRN. DX. E11.9   simvastatin 40 MG tablet Commonly known as: Zocor Take 1 tablet (40 mg total) by mouth every evening.   telmisartan 40 MG tablet Commonly known as: Micardis Take 1 tablet (40 mg total) by mouth daily.   triamcinolone 0.025 % ointment Commonly known as: KENALOG Apply 1 application topically 2 (two) times daily.          Objective:    BP (!) 144/81   Pulse (!) 152   Temp 97.8 F (36.6 C) (Oral)   Ht 5' 1" (1.549 m)   Wt 188 lb (85.3 kg)   LMP 05/11/1992   BMI 35.52 kg/m   Allergies  Allergen Reactions  . Ace Inhibitors Cough  . Feldene [Piroxicam]     REACTION: RASH TO HANDS  . Meloxicam Nausea And Vomiting  . Prevnar [Pneumococcal 13-Val Conj Vacc]   . Statins Other (See Comments)    myalgia  . Sulfa Antibiotics     Rash   . Zithromax [Azithromycin Dihydrate] Itching    Wt Readings from Last 3 Encounters:  08/17/18 188 lb (85.3 kg)  07/01/18 195 lb (88.5 kg)  02/11/18 196 lb 12.8 oz (89.3 kg)    Physical Exam Constitutional:      Appearance: She is well-developed.  HENT:     Head: Normocephalic and atraumatic.  Eyes:     Conjunctiva/sclera: Conjunctivae normal.     Pupils: Pupils are equal, round, and reactive to light.  Cardiovascular:     Rate and Rhythm: Tachycardia present. Rhythm irregular.     Heart sounds: Normal heart sounds. No murmur.  Pulmonary:     Effort: Pulmonary effort is normal. No respiratory distress.     Breath sounds: Normal breath sounds.  Abdominal:     General: Bowel sounds are normal.      Palpations: Abdomen is soft.  Skin:    General: Skin is warm and dry.     Findings: No rash.  Neurological:     Mental Status: She is alert and oriented to person, place, and time.     Deep Tendon  Reflexes: Reflexes are normal and symmetric.  Psychiatric:        Behavior: Behavior normal.        Thought Content: Thought content normal.        Judgment: Judgment normal.     Results for orders placed or performed in visit on 07/01/18  Bayer DCA Hb A1c Waived  Result Value Ref Range   HB A1C (BAYER DCA - WAIVED) 5.7 <7.0 %  CMP14+EGFR  Result Value Ref Range   Glucose 97 65 - 99 mg/dL   BUN 11 8 - 27 mg/dL   Creatinine, Ser 1.13 (H) 0.57 - 1.00 mg/dL   GFR calc non Af Amer 46 (L) >59 mL/min/1.73   GFR calc Af Amer 53 (L) >59 mL/min/1.73   BUN/Creatinine Ratio 10 (L) 12 - 28   Sodium 143 134 - 144 mmol/L   Potassium 4.0 3.5 - 5.2 mmol/L   Chloride 101 96 - 106 mmol/L   CO2 25 20 - 29 mmol/L   Calcium 9.0 8.7 - 10.3 mg/dL   Total Protein 6.7 6.0 - 8.5 g/dL   Albumin 4.4 3.7 - 4.7 g/dL   Globulin, Total 2.3 1.5 - 4.5 g/dL   Albumin/Globulin Ratio 1.9 1.2 - 2.2   Bilirubin Total 0.5 0.0 - 1.2 mg/dL   Alkaline Phosphatase 67 39 - 117 IU/L   AST 19 0 - 40 IU/L   ALT 14 0 - 32 IU/L  Lipid panel  Result Value Ref Range   Cholesterol, Total 157 100 - 199 mg/dL   Triglycerides 139 0 - 149 mg/dL   HDL 49 >39 mg/dL   VLDL Cholesterol Cal 28 5 - 40 mg/dL   LDL Calculated 80 0 - 99 mg/dL   Chol/HDL Ratio 3.2 0.0 - 4.4 ratio      Assessment & Plan:    1. Palpitations - EKG 12-Lead  2. Tachycardia - EKG 12-Lead  3. Dizziness - EKG 12-Lead  4. EKG abnormalities - EKG 12-Lead  Tachycardia with possible atrial fibrillation.  She is transported by EMS to [facility] emergency department.  Continue all other maintenance medications as listed above.  Follow up plan: No follow-ups on file.   Terald Sleeper PA-C [facility] [address]   [place], [place] [address] [phone]   08/17/2018, 11:41 AM

## 2018-08-18 ENCOUNTER — Encounter (HOSPITAL_COMMUNITY): Payer: Self-pay | Admitting: *Deleted

## 2018-08-18 ENCOUNTER — Observation Stay (HOSPITAL_BASED_OUTPATIENT_CLINIC_OR_DEPARTMENT_OTHER): Payer: Medicare HMO

## 2018-08-18 DIAGNOSIS — I4891 Unspecified atrial fibrillation: Secondary | ICD-10-CM | POA: Diagnosis not present

## 2018-08-18 LAB — BASIC METABOLIC PANEL
Anion gap: 9 (ref 5–15)
BUN: 14 mg/dL (ref 8–23)
CO2: 28 mmol/L (ref 22–32)
Calcium: 8.9 mg/dL (ref 8.9–10.3)
Chloride: 106 mmol/L (ref 98–111)
Creatinine, Ser: 1.07 mg/dL — ABNORMAL HIGH (ref 0.44–1.00)
GFR calc Af Amer: 57 mL/min — ABNORMAL LOW (ref >60)
GFR calc non Af Amer: 49 mL/min — ABNORMAL LOW (ref >60)
Glucose, Bld: 117 mg/dL — ABNORMAL HIGH (ref 70–99)
Potassium: 3.9 mmol/L (ref 3.5–5.1)
Sodium: 143 mmol/L (ref 135–145)

## 2018-08-18 LAB — CBC
HCT: 43.4 % (ref 36.0–46.0)
Hemoglobin: 13.5 g/dL (ref 12.0–15.0)
MCH: 28.2 pg (ref 26.0–34.0)
MCHC: 31.1 g/dL (ref 30.0–36.0)
MCV: 90.8 fL (ref 80.0–100.0)
Platelets: 276 10*3/uL (ref 150–400)
RBC: 4.78 MIL/uL (ref 3.87–5.11)
RDW: 14.1 % (ref 11.5–15.5)
WBC: 9.1 10*3/uL (ref 4.0–10.5)
nRBC: 0 % (ref 0.0–0.2)

## 2018-08-18 LAB — GLUCOSE, CAPILLARY
Glucose-Capillary: 118 mg/dL — ABNORMAL HIGH (ref 70–99)
Glucose-Capillary: 113 mg/dL — ABNORMAL HIGH (ref 70–99)

## 2018-08-18 LAB — ECHOCARDIOGRAM COMPLETE
Height: 63.5 in
Weight: 3266.34 oz

## 2018-08-18 MED ORDER — LEVOTHYROXINE SODIUM 50 MCG PO TABS: 50.0000 ug | ORAL_TABLET | Freq: Every day | ORAL | 3 refills | Status: DC

## 2018-08-18 MED ORDER — PRAVASTATIN SODIUM 80 MG PO TABS: 80.0000 mg | ORAL_TABLET | ORAL | 3 refills | Status: DC

## 2018-08-18 MED ORDER — DILTIAZEM HCL ER 60 MG PO CP12
60.0000 mg | ORAL_CAPSULE | Freq: Two times a day (BID) | ORAL | Status: DC
  Filled 2018-08-18 (×5): qty 1

## 2018-08-18 MED ORDER — PRAVASTATIN SODIUM 40 MG PO TABS: 80.0000 mg | ORAL_TABLET | ORAL | Status: DC

## 2018-08-18 MED ORDER — DILTIAZEM HCL 30 MG PO TABS
30.0000 mg | ORAL_TABLET | Freq: Four times a day (QID) | ORAL | Status: DC
  Administered 2018-08-18: 12:00:00 30 mg via ORAL
  Filled 2018-08-18: qty 1

## 2018-08-18 MED ORDER — APIXABAN 5 MG PO TABS: 5.0000 mg | ORAL_TABLET | Freq: Two times a day (BID) | ORAL | 3 refills | Status: DC

## 2018-08-18 MED ORDER — DILTIAZEM HCL ER COATED BEADS 120 MG PO CP24: 120.0000 mg | ORAL_CAPSULE | Freq: Every day | ORAL | Status: DC

## 2018-08-18 MED ORDER — DILTIAZEM HCL ER COATED BEADS 120 MG PO CP24: 120.0000 mg | ORAL_CAPSULE | Freq: Every day | ORAL | 11 refills | Status: DC

## 2018-08-18 NOTE — Progress Notes (Signed)
*  PRELIMINARY RESULTS* Echocardiogram 2D Echocardiogram has been performed.  Leavy Cella 08/18/2018, 11:46 AM

## 2018-08-18 NOTE — Clinical Social Work Note (Signed)
Patient's daughter, [name], contacted and indicated that an Eliquis coupon provided to patient was not working at [facility] in [place].  LCSW contacted [facility] in [place] and was informed that patient's coupon excluded her because it was not for people who had Medicare. LCSW provided pharmacy staff with another Eliquis coupon via phone and it went through.  LCSW returned contacted to [facility] and advised that issue had been solved.     Lorne Winkels, Clydene Pugh, LCSW

## 2018-08-18 NOTE — Discharge Summary (Signed)
Physician Discharge Summary  Veronica Paul EXB:[id number] DOB: 10-07-1935 DOA: 08/17/2018  PCP: Chevis Pretty, FNP  Admit date: 08/17/2018  Discharge date: 08/18/2018  Admitted From:Home  Disposition:  Home  Recommendations for Outpatient Follow-up:  1. Follow up with PCP in 1-2 weeks 2. Follow-up with cardiology Dr. Percival Spanish on 7/15 as scheduled with follow-up on echocardiogram at that time which was performed while inpatient on 7/1 3. Continue on metoprolol XL, now with addition of Cardizem 120 mg ER, and Eliquis twice daily 4. Hold home Lasix and telmisartan due to soft blood pressure readings 5. Continue on pravastatin now instead of simvastatin as prescribed 6. Levothyroxine dose decreased to 50 mcg from 75 mcg due to low TSH level which will need close follow-up with PCP  Home Health: None  Equipment/Devices: None  Discharge Condition: Stable  CODE STATUS: Full  Diet recommendation: Heart Healthy/carb modified  Brief/Interim Summary: Per HPI: Veronica Paul is a 83 y.o. female with medical history significant for type 2 diabetes, hypertension, hyperlipidemia, hypothyroidism, CKD stage II, depression, and anxiety who presents the ED for evaluation of palpitations/A. fib with RVR.  Patient reports about 2-3 days of new onset central chest tightness and palpitations.  She thought this was related to change in her losartan to telmisartan which she had been taking for about more than a week.  She has also experienced lightheadedness with exertion and intermittent dyspnea.  She was seen by her PCP today 08/17/2018.  An EKG was performed which showed tachycardia with rate in the 160s, probable atrial fibrillation with RVR.  She was subsequently sent to the ED for further evaluation management.  Patient denies any personal known history of CHF, MI, or arrhythmia.  She currently denies any nausea, vomiting, cough, abdominal pain, dysuria, or diarrhea.  She denies any obvious  bleeding including epistaxis, hemoptysis, hematemesis, hematuria, or hematochezia.  Patient was admitted with atrial fibrillation with RVR and was started on Cardizem drip as well as Eliquis given 7.2% stroke risk on chads vasc.  She was also noted to have some mild hypokalemia which has been repleted.  She has been weaned off the drip earlier this morning on 7/1 and maintains a heart rate in the 90-100 bpm range.  Cardiology consultation currently deferred, but agree with the plan as noted and will have close follow-up with her cardiologist in the next 2 weeks.  She will now remain on Cardizem as well as Eliquis with holding of telmisartan until repeat blood pressures are performed outpatient.  2D echocardiogram has been performed on day of discharge which will need close follow-up in the outpatient setting.  Synthroid dose is also been decreased on account of low TSH level noted-this may have contributed to her new onset atrial fibrillation as well.  No other acute events or concerns noted during the course of this admission.  Patient does not have any further chest pain, palpitations, or shortness of breath and is otherwise stable for discharge.  Discharge Diagnoses:  Principal Problem:   Atrial fibrillation with RVR ([place]) Active Problems:   Hypertension   Hyperlipidemia   Diabetes mellitus without complication (HCC)   GAD (generalized anxiety disorder)   Hypothyroidism   Hypokalemia  Principal discharge diagnosis: New onset atrial fibrillation with RVR.  Discharge Instructions  Discharge Instructions    Diet - low sodium heart healthy   Complete by: As directed    Increase activity slowly   Complete by: As directed      Allergies as of 08/18/2018  Reactions   Ace Inhibitors Cough   Feldene [piroxicam]    REACTION: RASH TO HANDS   Meloxicam Nausea And Vomiting   Prevnar [pneumococcal 13-val Conj Vacc]    Statins Other (See Comments)   myalgia   Sulfa Antibiotics    Rash    Zithromax [azithromycin Dihydrate] Itching      Medication List    STOP taking these medications   furosemide 40 MG tablet Commonly known as: LASIX   simvastatin 40 MG tablet Commonly known as: Zocor   telmisartan 40 MG tablet Commonly known as: Micardis   triamcinolone 0.025 % ointment Commonly known as: KENALOG     TAKE these medications   apixaban 5 MG Tabs tablet Commonly known as: ELIQUIS Take 1 tablet (5 mg total) by mouth 2 (two) times daily.   aspirin EC 81 MG tablet Take 81 mg by mouth daily.   BLACK COHOSH PO Take by mouth.   clonazePAM 0.5 MG tablet Commonly known as: KLONOPIN Take 1 tablet (0.5 mg total) by mouth 2 (two) times daily as needed.   diltiazem 120 MG 24 hr capsule Commonly known as: Cardizem CD Take 1 capsule (120 mg total) by mouth daily. Start taking on: August 19, 2018   fish oil-omega-3 fatty acids 1000 MG capsule Take 2 g by mouth daily.   levothyroxine 50 MCG tablet Commonly known as: SYNTHROID Take 1 tablet (50 mcg total) by mouth daily at 6 (six) AM. Start taking on: August 19, 2018 What changed:   medication strength  how much to take  when to take this   metFORMIN 500 MG tablet Commonly known as: GLUCOPHAGE Take 1 tablet (500 mg total) by mouth daily.   metoprolol 200 MG 24 hr tablet Commonly known as: TOPROL-XL TAKE 1 AND 1/2 TABLET DAILY   nitroGLYCERIN 0.4 MG SL tablet Commonly known as: Nitrostat Place 1 tablet (0.4 mg total) under the tongue every 5 (five) minutes as needed for chest pain.   pravastatin 80 MG tablet Commonly known as: PRAVACHOL Take 1 tablet (80 mg total) by mouth every other day.      Follow-up Information    Minus Breeding, MD Follow up on 09/01/2018.   Specialty: Cardiology Why: [facility]iology Hospital Follow-up on 09/01/2018 at 3:20 PM.  Contact information: [address] [place] [address] [phone]        Chevis Pretty, FNP Follow up in 2 week(s).   Specialty: Family  Medicine Contact information: [address] [place] [address] [phone]          Allergies  Allergen Reactions  . Ace Inhibitors Cough  . Feldene [Piroxicam]     REACTION: RASH TO HANDS  . Meloxicam Nausea And Vomiting  . Prevnar [Pneumococcal 13-Val Conj Vacc]   . Statins Other (See Comments)    myalgia  . Sulfa Antibiotics     Rash   . Zithromax [Azithromycin Dihydrate] Itching    Consultations:  None   Procedures/Studies: Dg Chest Port 1 View  Result Date: 08/17/2018 CLINICAL DATA:  Cardiac palpitations EXAM: PORTABLE CHEST 1 VIEW COMPARISON:  10/26/2017 FINDINGS: Cardiac shadow is stable. Mild scarring in the right base is noted. No focal infiltrate or sizable effusion is seen. No bony abnormality is noted. IMPRESSION: No acute abnormality noted. Electronically Signed   By: Inez Catalina M.D.   On: 08/17/2018 13:27     Discharge Exam: Vitals:   08/18/18 0830 08/18/18 0900  BP:  (!) 102/56  Pulse:  (!) 104  Resp:  18  Temp: 97.7 F (36.5 C)   SpO2:  97%   Vitals:   08/18/18 0744 08/18/18 0800 08/18/18 0830 08/18/18 0900  BP:  114/76  (!) 102/56  Pulse:  92  (!) 104  Resp:  20  18  Temp: 97.7 F (36.5 C)  97.7 F (36.5 C)   TempSrc: Oral  Oral   SpO2:  96%  97%  Weight:      Height:        General: Pt is alert, awake, not in acute distress Cardiovascular: Irregular, S1/S2 +, no rubs, no gallops Respiratory: CTA bilaterally, no wheezing, no rhonchi Abdominal: Soft, NT, ND, bowel sounds + Extremities: no edema, no cyanosis    The results of significant diagnostics from this hospitalization (including imaging, microbiology, ancillary and laboratory) are listed below for reference.     Microbiology: Recent Results (from the past 240 hour(s))  SARS Coronavirus 2 (CEPHEID - Performed in [place] hospital lab), Hosp Order     Status: None   Collection Time: 08/17/18  2:49 PM   Specimen: Nasopharyngeal Swab  Result Value Ref Range  Status   SARS Coronavirus 2 NEGATIVE NEGATIVE Final    Comment: (NOTE) If result is NEGATIVE SARS-CoV-2 target nucleic acids are NOT DETECTED. The SARS-CoV-2 RNA is generally detectable in upper and lower  respiratory specimens during the acute phase of infection. The lowest  concentration of SARS-CoV-2 viral copies this assay can detect is 250  copies / mL. A negative result does not preclude SARS-CoV-2 infection  and should not be used as the sole basis for treatment or other  patient management decisions.  A negative result may occur with  improper specimen collection / handling, submission of specimen other  than nasopharyngeal swab, presence of viral mutation(s) within the  areas targeted by this assay, and inadequate number of viral copies  (<250 copies / mL). A negative result must be combined with clinical  observations, patient history, and epidemiological information. If result is POSITIVE SARS-CoV-2 target nucleic acids are DETECTED. The SARS-CoV-2 RNA is generally detectable in upper and lower  respiratory specimens dur ing the acute phase of infection.  Positive  results are indicative of active infection with SARS-CoV-2.  Clinical  correlation with patient history and other diagnostic information is  necessary to determine patient infection status.  Positive results do  not rule out bacterial infection or co-infection with other viruses. If result is PRESUMPTIVE POSTIVE SARS-CoV-2 nucleic acids MAY BE PRESENT.   A presumptive positive result was obtained on the submitted specimen  and confirmed on repeat testing.  While 2019 novel coronavirus  (SARS-CoV-2) nucleic acids may be present in the submitted sample  additional confirmatory testing may be necessary for epidemiological  and / or clinical management purposes  to differentiate between  SARS-CoV-2 and other Sarbecovirus currently known to infect humans.  If clinically indicated additional testing with an alternate  test  methodology [phone]) is advised. The SARS-CoV-2 RNA is generally  detectable in upper and lower respiratory sp ecimens during the acute  phase of infection. The expected result is Negative. Fact Sheet for Patients:  StrictlyIdeas.no Fact Sheet for Healthcare Providers: BankingDealers.co.za This test is not yet approved or cleared by the [place] FDA and has been authorized for detection and/or diagnosis of SARS-CoV-2 by FDA under an Emergency Use Authorization (EUA).  This EUA will remain in effect (meaning this test can be used) for the duration of the COVID-19 declaration under Section 564(b)(1)  of the Act, 21 U.S.C. section 360bbb-3(b)(1), unless the authorization is terminated or revoked sooner. Performed at [facility], [address] [place], [place] [address]   MRSA PCR Screening     Status: None   Collection Time: 08/17/18  4:38 PM   Specimen: Nasal Mucosa; Nasopharyngeal  Result Value Ref Range Status   MRSA by PCR NEGATIVE NEGATIVE Final    Comment:        The GeneXpert MRSA Assay (FDA approved for NASAL specimens only), is one component of a comprehensive MRSA colonization surveillance program. It is not intended to diagnose MRSA infection nor to guide or monitor treatment for MRSA infections. Performed at [facility], [address] [place], [place] [address]      Labs: BNP (last 3 results) Recent Labs    08/17/18 1232  BNP 914.7*   Basic Metabolic Panel: Recent Labs  Lab 08/17/18 1232 08/18/18 0522  NA 141 143  K 3.4* 3.9  CL 101 106  CO2 26 28  GLUCOSE 123* 117*  BUN 15 14  CREATININE 1.21* 1.07*  CALCIUM 9.1 8.9  MG 2.1  --    Liver Function Tests: No results for input(s): AST, ALT, ALKPHOS, BILITOT, PROT, ALBUMIN in the last 168 hours. No results for input(s): LIPASE, AMYLASE in the last 168 hours. No results for input(s): AMMONIA in the last 168 hours. CBC: Recent Labs  Lab  08/17/18 1232 08/18/18 0522  WBC 8.1 9.1  NEUTROABS 5.6  --   HGB 14.6 13.5  HCT 46.3* 43.4  MCV 89.7 90.8  PLT 304 276   Cardiac Enzymes: No results for input(s): CKTOTAL, CKMB, CKMBINDEX, TROPONINI in the last 168 hours. BNP: Invalid input(s): POCBNP CBG: Recent Labs  Lab 08/17/18 1703 08/17/18 2112 08/18/18 0746  GLUCAP 101* 105* 118*   D-Dimer No results for input(s): DDIMER in the last 72 hours. Hgb A1c No results for input(s): HGBA1C in the last 72 hours. Lipid Profile No results for input(s): CHOL, HDL, LDLCALC, TRIG, CHOLHDL, LDLDIRECT in the last 72 hours. Thyroid function studies Recent Labs    08/17/18 1232  TSH 0.313*   Anemia work up No results for input(s): VITAMINB12, FOLATE, FERRITIN, TIBC, IRON, RETICCTPCT in the last 72 hours. Urinalysis    Component Value Date/Time   BILIRUBINUR neg 05/11/2012 1019   PROTEINUR neg 05/11/2012 1019   UROBILINOGEN negative 05/11/2012 1019   NITRITE neg 05/11/2012 1019   LEUKOCYTESUR Negative 05/11/2012 1019   Sepsis Labs Invalid input(s): PROCALCITONIN,  WBC,  LACTICIDVEN Microbiology Recent Results (from the past 240 hour(s))  SARS Coronavirus 2 (CEPHEID - Performed in [place] hospital lab), Hosp Order     Status: None   Collection Time: 08/17/18  2:49 PM   Specimen: Nasopharyngeal Swab  Result Value Ref Range Status   SARS Coronavirus 2 NEGATIVE NEGATIVE Final    Comment: (NOTE) If result is NEGATIVE SARS-CoV-2 target nucleic acids are NOT DETECTED. The SARS-CoV-2 RNA is generally detectable in upper and lower  respiratory specimens during the acute phase of infection. The lowest  concentration of SARS-CoV-2 viral copies this assay can detect is 250  copies / mL. A negative result does not preclude SARS-CoV-2 infection  and should not be used as the sole basis for treatment or other  patient management decisions.  A negative result may occur with  improper specimen collection / handling, submission  of specimen other  than nasopharyngeal swab, presence of viral mutation(s) within the  areas targeted by this assay,  and inadequate number of viral copies  (<250 copies / mL). A negative result must be combined with clinical  observations, patient history, and epidemiological information. If result is POSITIVE SARS-CoV-2 target nucleic acids are DETECTED. The SARS-CoV-2 RNA is generally detectable in upper and lower  respiratory specimens dur ing the acute phase of infection.  Positive  results are indicative of active infection with SARS-CoV-2.  Clinical  correlation with patient history and other diagnostic information is  necessary to determine patient infection status.  Positive results do  not rule out bacterial infection or co-infection with other viruses. If result is PRESUMPTIVE POSTIVE SARS-CoV-2 nucleic acids MAY BE PRESENT.   A presumptive positive result was obtained on the submitted specimen  and confirmed on repeat testing.  While 2019 novel coronavirus  (SARS-CoV-2) nucleic acids may be present in the submitted sample  additional confirmatory testing may be necessary for epidemiological  and / or clinical management purposes  to differentiate between  SARS-CoV-2 and other Sarbecovirus currently known to infect humans.  If clinically indicated additional testing with an alternate test  methodology [phone]) is advised. The SARS-CoV-2 RNA is generally  detectable in upper and lower respiratory sp ecimens during the acute  phase of infection. The expected result is Negative. Fact Sheet for Patients:  StrictlyIdeas.no Fact Sheet for Healthcare Providers: BankingDealers.co.za This test is not yet approved or cleared by the [place] FDA and has been authorized for detection and/or diagnosis of SARS-CoV-2 by FDA under an Emergency Use Authorization (EUA).  This EUA will remain in effect (meaning this test can be used) for  the duration of the COVID-19 declaration under Section 564(b)(1) of the Act, 21 U.S.C. section 360bbb-3(b)(1), unless the authorization is terminated or revoked sooner. Performed at [facility], [address], [place], [place] [address]   MRSA PCR Screening     Status: None   Collection Time: 08/17/18  4:38 PM   Specimen: Nasal Mucosa; Nasopharyngeal  Result Value Ref Range Status   MRSA by PCR NEGATIVE NEGATIVE Final    Comment:        The GeneXpert MRSA Assay (FDA approved for NASAL specimens only), is one component of a comprehensive MRSA colonization surveillance program. It is not intended to diagnose MRSA infection nor to guide or monitor treatment for MRSA infections. Performed at [facility], [address], [place], [place] [address]      Time coordinating discharge: 35 minutes  SIGNED:   Rodena Goldmann, DO [facility] 08/18/2018, 10:44 AM  If 7PM-7AM, please contact night-coverage [email or web address] Password [email or web address]

## 2018-08-18 NOTE — Progress Notes (Signed)
Discharge education was given to pt; pt verbalized understanding. IVs removed. Daughter for transportation to home.

## 2018-08-18 NOTE — Discharge Instructions (Signed)

## 2018-08-20 ENCOUNTER — Other Ambulatory Visit: Payer: Self-pay | Admitting: Nurse Practitioner

## 2018-08-20 MED ORDER — BLOOD GLUCOSE MONITOR KIT: PACK | 0 refills | Status: DC

## 2018-08-20 MED ORDER — OMRON WRIST BP MONITOR DEVI: 1.0000 | Freq: Two times a day (BID) | 0 refills | Status: DC

## 2018-08-20 NOTE — Telephone Encounter (Signed)
Orders sent

## 2018-08-24 ENCOUNTER — Telehealth: Payer: Self-pay | Admitting: Nurse Practitioner

## 2018-08-24 ENCOUNTER — Telehealth: Payer: Self-pay | Admitting: *Deleted

## 2018-08-24 DIAGNOSIS — I10 Essential (primary) hypertension: Secondary | ICD-10-CM

## 2018-08-24 DIAGNOSIS — R69 Illness, unspecified: Secondary | ICD-10-CM | POA: Diagnosis not present

## 2018-08-24 MED ORDER — ONETOUCH VERIO VI STRP: ORAL_STRIP | 3 refills | Status: DC

## 2018-08-24 MED ORDER — ACCU-CHEK GUIDE W/DEVICE KIT: 1.0000 | PACK | Freq: Every day | 0 refills | Status: DC

## 2018-08-24 MED ORDER — ACCU-CHEK GUIDE VI STRP: ORAL_STRIP | 3 refills | Status: DC

## 2018-08-24 MED ORDER — ONETOUCH VERIO W/DEVICE KIT: PACK | 0 refills | Status: DC

## 2018-08-24 MED ORDER — NITROGLYCERIN 0.4 MG SL SUBL: 0.4000 mg | SUBLINGUAL_TABLET | SUBLINGUAL | 0 refills | Status: DC | PRN

## 2018-08-24 MED ORDER — ONETOUCH ULTRASOFT LANCETS MISC: 3 refills | Status: DC

## 2018-08-24 MED ORDER — ACCU-CHEK FASTCLIX LANCETS MISC: 3 refills | Status: DC

## 2018-08-24 NOTE — Addendum Note (Signed)
Addended by: Antonietta Barcelona D on: 08/24/2018 10:24 AM   Modules accepted: Orders

## 2018-08-24 NOTE — Telephone Encounter (Signed)
Glucose meter did not go electronically couple days ago, resent

## 2018-08-24 NOTE — Telephone Encounter (Signed)
Tye Maryland took care of it

## 2018-08-30 ENCOUNTER — Telehealth: Payer: Self-pay | Admitting: *Deleted

## 2018-08-30 NOTE — Telephone Encounter (Signed)
    COVID-19 Pre-Screening Questions:  . In the past 7 to 10 days have you had a cough,  shortness of breath, headache, congestion, fever (100 or greater) body aches, chills, sore throat, or sudden loss of taste or sense of smell? . Have you been around anyone with known Covid 19. . Have you been around anyone who is awaiting Covid 19 test results in the past 7 to 10 days? . Have you been around anyone who has been exposed to Covid 19, or has mentioned symptoms of Covid 19 within the past 7 to 10 days?  If you have any concerns/questions about symptoms patients report during screening (either on the phone or at threshold). Contact the provider seeing the patient or DOD for further guidance.  If neither are available contact a member of the leadership team.          Patient was tested in hospital, negative. No to all symptoms. She will wear a mask, come only 15 min early and call if her health changes before her appt. CP/cma

## 2018-08-30 NOTE — Telephone Encounter (Signed)
Received call from patient stating she receive olmesartan Rx. Medication not on med list.  Is she to be taking this medication?

## 2018-08-31 NOTE — Telephone Encounter (Signed)
Patient aware.

## 2018-08-31 NOTE — Telephone Encounter (Signed)
Her losartan was on back order and dr. Warrick Parisian changed it to [facility].

## 2018-08-31 NOTE — Progress Notes (Signed)
Cardiology Office Note   Date:  09/01/2018   ID:  Veronica Paul, DOB 26-May-1935, MRN [id number]  PCP:  Chevis Pretty, FNP  Cardiologist:   No primary care provider on file. Referring:  Chevis Pretty, FNP  No chief complaint on file.     History of Present Illness: Veronica Paul is a 83 y.o. female who is referred by Chevis Pretty, FNP for evaluation of atrial fib.  She was just in the hospital for this.  I reviewed these records for this visit.   We did not see her at that time.  She was treated with Eliquis, beta blocker and Cardizem.  She actually had low TSH and so synthroid was increased.  She had low BP so Lasix and ARB were held.  She had an echo with normal EF.   I saw her last in 2014. At that time she had atypical chest pain.  She had had a negative perfusion study in 2012 and so no further testing was ordered.    She said she started having palpitations a couple weeks before she presented to the hospital although she is not entirely clear about this.  She thought it was related to the fact that she could not get her losartan which was prescribed because it was not available and they wanted to switch her to another ARB.  She related this to the palpitations.  She is also having some chest discomfort with this.  Her cardiac enzymes were perfectly normal in the hospital.  Her heart rate was reasonably controlled with a discharge her.  She still feels some palpitations and her rate is elevated as below.  She is not having any presyncope or syncope.  She is not having any shortness of breath, PND or orthopnea.  She lives alone.  Her daughter said that her speech and mentation is a little bit slower than previous.  However, she has had no focal neurologic deficits.   Past Medical History:  Diagnosis Date  . Depression   . Diabetes mellitus   . Dyslipidemia   . HTN (hypertension)   . Hypothyroidism   . Lung cancer ([place])   . Obesity   . Osteopenia   .  Vitamin D deficiency     Past Surgical History:  Procedure Laterality Date  . LUNG REMOVAL, PARTIAL  1998   Right  . THYROIDECTOMY       Current Outpatient Medications  Medication Sig Dispense Refill  . apixaban (ELIQUIS) 5 MG TABS tablet Take 1 tablet (5 mg total) by mouth 2 (two) times daily. 60 tablet 3  . aspirin EC 81 MG EC tablet Take 81 mg by mouth daily.      Marland Kitchen BLACK COHOSH PO Take by mouth.      . clonazePAM (KLONOPIN) 0.5 MG tablet Take 1 tablet (0.5 mg total) by mouth 2 (two) times daily as needed. 60 tablet 1  . diltiazem (CARDIZEM CD) 240 MG 24 hr capsule Take 1 capsule (240 mg total) by mouth daily. 90 capsule 3  . fish oil-omega-3 fatty acids 1000 MG capsule Take 2 g by mouth daily.      Marland Kitchen levothyroxine (SYNTHROID) 50 MCG tablet Take 1 tablet (50 mcg total) by mouth daily at 6 (six) AM. 30 tablet 3  . metFORMIN (GLUCOPHAGE) 500 MG tablet Take 1 tablet (500 mg total) by mouth daily. 90 tablet 1  . metoprolol (TOPROL-XL) 200 MG 24 hr tablet TAKE 1 AND 1/2 TABLET DAILY 135  tablet 1  . nitroGLYCERIN (NITROSTAT) 0.4 MG SL tablet Place 1 tablet (0.4 mg total) under the tongue every 5 (five) minutes as needed for chest pain. 30 tablet 0  . pravastatin (PRAVACHOL) 80 MG tablet Take 1 tablet (80 mg total) by mouth every other day. 30 tablet 3  . blood glucose meter kit and supplies KIT Dispense based on patient and insurance preference. Use up to four times daily as directed. (FOR ICD-9 250.00, 250.01). 1 each 0  . Blood Glucose Monitoring Suppl (ONETOUCH VERIO) w/Device KIT Check blood sugars daily Dx E11.9 1 kit 0  . Blood Pressure Monitoring (OMRON WRIST BP MONITOR) DEVI 1 each by Does not apply route 2 (two) times a day. 1 kit 0  . glucose blood (ONETOUCH VERIO) test strip Check blood sugars daily Dx E11.9 100 each 3  . Lancets (ONETOUCH ULTRASOFT) lancets Check blood sugars daily Dx E11.9 100 each 3   No current facility-administered medications for this visit.      Allergies:   Ace inhibitors, Feldene [piroxicam], Meloxicam, Prevnar [pneumococcal 13-val conj vacc], Statins, Sulfa antibiotics, and Zithromax [azithromycin dihydrate]    Social History:  The patient  reports that she has never smoked. She has never used smokeless tobacco. She reports that she does not drink alcohol or use drugs.   Family History:  The patient's family history includes Arthritis in her daughter; COPD in her daughter; Cancer in her brother; Coronary artery disease (age of onset: 43) in her brother; Fibromyalgia in her daughter; Heart disease in her brother, father, and sister; Hypertension in her brother, father, mother, sister, and sister; Stroke in her maternal grandmother.    ROS:  Please see the history of present illness.   Otherwise, review of systems are positive for none.   All other systems are reviewed and negative.    PHYSICAL EXAM: VS:  BP (!) 130/100   Pulse (!) 114   Ht _0  (1.549 m)   Wt 194 lb (88 kg)   LMP 05/11/1992   BMI 36.66 kg/m  , BMI Body mass index is 36.66 kg/m. GENERAL:  Well appearing HEENT:  Pupils equal round and reactive, fundi not visualized, oral mucosa unremarkable NECK:  No jugular venous distention, waveform within normal limits, carotid upstroke brisk and symmetric, no bruits, no thyromegaly LYMPHATICS:  No cervical, inguinal adenopathy LUNGS:  Clear to auscultation bilaterally BACK:  No CVA tenderness CHEST:  Unremarkable HEART:  PMI not displaced or sustained,S1 and S2 within normal limits, no S3, no clicks, no rubs, no murmurs, irregular ABD:  Flat, positive bowel sounds normal in frequency in pitch, no bruits, no rebound, no guarding, no midline pulsatile mass, no hepatomegaly, no splenomegaly EXT:  2 plus pulses throughout, no edema, no cyanosis no clubbing SKIN:  No rashes no nodules NEURO:  Cranial nerves II through XII grossly intact, motor grossly intact throughout PSYCH:  Cognitively intact, oriented to person place  and time    EKG:  EKG is ordered today. The ekg ordered today demonstrates atrial fibrillation, rate 100, axis within normal limits, intervals within normal limits, no acute ST-T wave changes.   Recent Labs: 07/01/2018: ALT 14 08/17/2018: B Natriuretic Peptide 402.0; Magnesium 2.1; TSH 0.313 08/18/2018: BUN 14; Creatinine, Ser 1.07; Hemoglobin 13.5; Platelets 276; Potassium 3.9; Sodium 143    Lipid Panel    Component Value Date/Time   CHOL 157 07/01/2018 0905   CHOL 100 05/07/2012 0957   TRIG 139 07/01/2018 0905   TRIG 148 06/21/2014  0932   TRIG 150 (H) 05/07/2012 0957   HDL 49 07/01/2018 0905   HDL 56 06/21/2014 0932   HDL 40 05/07/2012 0957   CHOLHDL 3.2 07/01/2018 0905   LDLCALC 80 07/01/2018 0905   LDLCALC 49 12/02/2013 0923   LDLCALC 30 05/07/2012 0957      Wt Readings from Last 3 Encounters:  09/01/18 194 lb (88 kg)  08/18/18 204 lb 2.3 oz (92.6 kg)  08/17/18 188 lb (85.3 kg)      Other studies Reviewed: Additional studies/ records that were reviewed today include: Hospital records. Review of the above records demonstrates:  Please see elsewhere in the note.     ASSESSMENT AND PLAN:  ATRIAL FIB: Her heart rate is slightly elevated.  She is tolerating anticoagulation and is on the appropriate dose.  Increase her Cardizem to 240 mg daily.  In a couple of weeks we will send her a monitor to wear for a couple of days.  If her rate control needs further management I will adjust the meds.  HTN: She is going to continue off the ARB which she is not been taking anyway and I will manage her meds with Cardizem.  SLOW SPEECH: Her daughter notices this but there is no focal neurologic deficit.  She has no work-up in the hospital.  She was hypothyroid and this was just increased.  I talked to her daughter about this and if she does not improve she will go back to her primary provider and may need neurology work-up or imaging.  Current medicines are reviewed at length with the  patient today.  The patient does not have concerns regarding medicines.  The following changes have been made:  As above.   Labs/ tests ordered today include:   Orders Placed This Encounter  Procedures  . LONG TERM MONITOR (3-14 DAYS)  . EKG 12-Lead     Disposition:   FU with me in 3 months.     Signed, Minus Breeding, MD  09/01/2018 4:44 PM    [facility]

## 2018-09-01 ENCOUNTER — Ambulatory Visit (INDEPENDENT_AMBULATORY_CARE_PROVIDER_SITE_OTHER): Payer: Medicare HMO | Admitting: Cardiology

## 2018-09-01 ENCOUNTER — Other Ambulatory Visit: Payer: Self-pay

## 2018-09-01 ENCOUNTER — Encounter: Payer: Self-pay | Admitting: Cardiology

## 2018-09-01 VITALS — BP 130/100 | HR 114 | Ht 61.0 in | Wt 194.0 lb

## 2018-09-01 DIAGNOSIS — I10 Essential (primary) hypertension: Secondary | ICD-10-CM | POA: Diagnosis not present

## 2018-09-01 DIAGNOSIS — I4891 Unspecified atrial fibrillation: Secondary | ICD-10-CM | POA: Diagnosis not present

## 2018-09-01 MED ORDER — DILTIAZEM HCL ER COATED BEADS 240 MG PO CP24: 240.0000 mg | ORAL_CAPSULE | Freq: Every day | ORAL | 3 refills | Status: DC

## 2018-09-01 NOTE — Patient Instructions (Signed)
Medication Instructions:  Please increase your Cardiazem to 240 mg daily. Continue all other medications as listed.  If you need a refill on your cardiac medications before your next appointment, please call your pharmacy.   Testing/Procedures: Your physician has recommended that you wear a holter monitor for 3 days (due in 2 weeks). Holter monitors are medical devices that record the heart's electrical activity. Doctors most often use these monitors to diagnose arrhythmias. Arrhythmias are problems with the speed or rhythm of the heartbeat. The monitor is a small, portable device. You can wear one while you do your normal daily activities. This is usually used to diagnose what is causing palpitations/syncope (passing out).  Follow-Up: FOLLOW UP IN 3 MONTHS WITH DR [facility].  Thank you for choosing [facility]!!

## 2018-09-02 ENCOUNTER — Telehealth: Payer: Self-pay | Admitting: Cardiology

## 2018-09-02 NOTE — Telephone Encounter (Signed)
Called patient's daughter back. She reports heart racing and palpitations that have remained constant since 730 am. Her Diltiazem was increased yesterday to 240 mg daily and she has taken that today but still has the palpitations. Her recent blood pressure and heart rate of 10 minutes ago is 144/97 and heart rate 115. She took a nitroglycerin around 945 am today to see if it would help but it did not. She denies shortness of breath and denies any chest pain. Will consult with DOD.

## 2018-09-02 NOTE — Telephone Encounter (Signed)
Called patient's daughter back and informed her to have patient take a extra 120 mg of diltiazem today per Dr. Ellyn Hack  to see if that helps. Patient will continue to monitor blood pressure heart rate and palpitations. The daughter will call back to let us know if this helped or not. No further questions.

## 2018-09-02 NOTE — Telephone Encounter (Signed)
Attempted to contact patient back. No answer. Voicemail is full will continue efforts.

## 2018-09-02 NOTE — Telephone Encounter (Signed)
Called patient again and was able to reach her. She reports she was having chest pain but it has gone away now since she took her klonopin. Patient advised to been seen in emergency department if chest pain returns. Still attempting to reach daughter, however no answer and mailbox is full.

## 2018-09-02 NOTE — Telephone Encounter (Signed)
Daughter called back stating patient was having some shortness of breath and a lot of chest pain

## 2018-09-02 NOTE — Telephone Encounter (Signed)
If she still has the 120 mg Diltiazem tabs with her from before -- have her go ahead & take one of those.  HR is about the same as it was in clinic yesterday --> not really long enough to get the higher dose of Diltiazem in her system.  May need to take additional 120 mg as needed for the next couple of days.  Not sure what Dr. Percival Spanish had planned to do going forward -- will defer to him.  Veronica Paul c

## 2018-09-02 NOTE — Telephone Encounter (Signed)
New Message    Pt c/o medication issue:  1. Name of Medication: Diltiazem   2. How are you currently taking this medication (dosage and times per day)? 240mg    3. Are you having a reaction (difficulty breathing--STAT)? NO  4. What is your medication issue? Patient took medication at 7:30 am and states heart started racing after taking it and hasn't slowed down yet.  BP 144/97 Pulse 115. Please call patient back.

## 2018-09-03 MED ORDER — DILTIAZEM HCL ER COATED BEADS 120 MG PO CP24: 360.0000 mg | ORAL_CAPSULE | Freq: Every day | ORAL | Status: DC

## 2018-09-03 NOTE — Telephone Encounter (Signed)
Still unsuccessful in reaching daughter to inform her of plan of care.

## 2018-09-03 NOTE — Telephone Encounter (Signed)
Per pt 's daughter calling back following up on answer to her question please give her a call back.   Stated Thanks!

## 2018-09-03 NOTE — Telephone Encounter (Signed)
  Update: Patient did take another cardizem and her heart was better but still not feeling right. They took her bp every 30 mins yesterday and last night it was 131/81 P 90. She took the 240 mg this morning at 7:30 am before taking the cardizem her bp 135/83 P 107 and her bp after taking meds was 130/83 P 83. Daughter said that last night the patient did have diarrhea and had black in her stool but she has not went today.

## 2018-09-03 NOTE — Telephone Encounter (Signed)
Spoke with pt daughter, aware patient is to take 3 of the 120 mg diltiazem tablets once daily.

## 2018-09-03 NOTE — Telephone Encounter (Signed)
Attempted to call daughter back again with no answer and unable to leave voicemail.

## 2018-09-03 NOTE — Telephone Encounter (Signed)
Yes continue Dilt as she is taking it.

## 2018-09-03 NOTE — Telephone Encounter (Signed)
Attempted to call daughter back x2 unsuccessfully. Also call patient again with no answer. When patient's daughter returns call please verify number she is calling from.

## 2018-09-03 NOTE — Telephone Encounter (Signed)
  Add on to previous note from today. Daughter is asking what they should do know about the medication. What dosage should she be on? They also want to know if she should take one dose in the am and one in the pm.

## 2018-09-06 ENCOUNTER — Telehealth: Payer: Self-pay | Admitting: *Deleted

## 2018-09-06 NOTE — Telephone Encounter (Signed)
3 day ZIO XT long term holter monitor to be mailed to patients home.  Patient instructed to apply monitor on 09/15/18 , wear for 3 days, and return to ZIO in the prepaid postage orange box in kit.  Instructions reviewed briefly as they are included in the monitor kit.

## 2018-09-10 ENCOUNTER — Other Ambulatory Visit: Payer: Self-pay | Admitting: *Deleted

## 2018-09-10 ENCOUNTER — Telehealth: Payer: Self-pay | Admitting: Cardiology

## 2018-09-10 MED ORDER — DILTIAZEM HCL ER COATED BEADS 120 MG PO CP24: 360.0000 mg | ORAL_CAPSULE | Freq: Every day | ORAL | 3 refills | Status: DC

## 2018-09-10 NOTE — Telephone Encounter (Signed)
[facility] has not yet received the refill for the 360 mg of Cardizem a day, also since patient has been taking the prescription she has been constipated and wants to know what would be the best thing for her to do about it. Please give patient and patient's daughter a call back to assist.    *STAT* If patient is at the pharmacy, call can be transferred to refill team.   1. Which medications need to be refilled? (please list name of each medication and dose if known) diltiazem (CARDIZEM CD) 120 MG 24 hr capsule Take 3 capsules (360 mg total) by mouth daily.  2. Which pharmacy/location (including street and city if local pharmacy) is medication to be sent to?  [id number] - [place], [place]  3. Do they need a 30 day or 90 day supply? 90 day supply

## 2018-09-10 NOTE — Telephone Encounter (Signed)
[place] patient  Med refilled Routed to [facility] for side effect review and recommendation for meds for constipation

## 2018-09-10 NOTE — Telephone Encounter (Signed)
Please increase fluid intake and use docusate sodium 100mg  daily as needed to help with constipation.

## 2018-09-13 ENCOUNTER — Other Ambulatory Visit: Payer: Self-pay

## 2018-09-13 ENCOUNTER — Telehealth: Payer: Self-pay | Admitting: Cardiology

## 2018-09-13 ENCOUNTER — Other Ambulatory Visit (INDEPENDENT_AMBULATORY_CARE_PROVIDER_SITE_OTHER): Payer: Medicare HMO

## 2018-09-13 ENCOUNTER — Ambulatory Visit: Payer: Medicare HMO | Admitting: Adult Health

## 2018-09-13 ENCOUNTER — Encounter: Payer: Self-pay | Admitting: Adult Health

## 2018-09-13 VITALS — BP 136/80 | HR 93 | Temp 97.5°F | Ht 63.5 in | Wt 190.0 lb

## 2018-09-13 DIAGNOSIS — I10 Essential (primary) hypertension: Secondary | ICD-10-CM | POA: Diagnosis not present

## 2018-09-13 DIAGNOSIS — R350 Frequency of micturition: Secondary | ICD-10-CM

## 2018-09-13 DIAGNOSIS — I4891 Unspecified atrial fibrillation: Secondary | ICD-10-CM

## 2018-09-13 DIAGNOSIS — I5032 Chronic diastolic (congestive) heart failure: Secondary | ICD-10-CM | POA: Diagnosis not present

## 2018-09-13 DIAGNOSIS — E0590 Thyrotoxicosis, unspecified without thyrotoxic crisis or storm: Secondary | ICD-10-CM | POA: Diagnosis not present

## 2018-09-13 MED ORDER — DILTIAZEM HCL ER COATED BEADS 360 MG PO CP24: 360.0000 mg | ORAL_CAPSULE | Freq: Every day | ORAL | 3 refills | Status: DC

## 2018-09-13 MED ORDER — HYDROCHLOROTHIAZIDE 12.5 MG PO CAPS: 12.5000 mg | ORAL_CAPSULE | ORAL | 3 refills | Status: DC | PRN

## 2018-09-13 NOTE — Telephone Encounter (Signed)
° °  Pt c/o swelling: STAT is pt has developed SOB within 24 hours  1) How much weight have you gained and in what time span?   2) If swelling, where is the swelling located? Feet and ankles  3) Are you currently taking a fluid pill? No. It was stopped on her last visit  4) Are you currently SOB? Mild since the end of June. Started right before the afib  That sent her to the hospital  5) Do you have a log of your daily weights (if so, list)? no  6) Have you gained 3 pounds in a day or 5 pounds in a week? no  7) Have you traveled recently? no   Daughter goes to visit the patient every other day. Her daughter has noticed swelling in the patient's feet and ankles, and the patient states that her socks feel tighter than normal  The daughter does not have a daily weight log The daughter also thinks that the patient was taken off of her fluid pill at her last appointment, and that one of her medications has a diuretic in it too.   The daughter is going over to the patient's house later today to put on the patient's 3 day heart monitor and will be able to evaluate the patient more then.

## 2018-09-13 NOTE — Telephone Encounter (Signed)
Returned call to patient's daughter Neoma Laming.She stated mother has increased swelling in both lower legs.Stated she is getting up 3 to 4 times every night to urinate for the past 2 weeks.Stated this is new she has never had to get up at night to urinate.Appointment scheduled with Jory Sims DNP today at 3:45 pm.

## 2018-09-13 NOTE — Patient Instructions (Addendum)
Medication Instructions:  TAKE- Metoprolol Succinate at bedtime  If you need a refill on your cardiac medications before your next appointment, please call your pharmacy.  Labwork: Urinalysis HERE IN OUR OFFICE AT [facility]    Take the provided lab slips with you to the lab for your blood draw.   When you have your labs (blood work) drawn today and your tests are completely normal, you will receive your results only by MyChart Message (if you have MyChart) -OR-  A paper copy in the mail.  If you have any lab test that is abnormal or we need to change your treatment, we will call you to review these results.  Testing/Procedures: None Ordered  Follow-Up: . Your physician recommends that you schedule a follow-up appointment in: 1 Month Wednesday August 26th @ 2:45 pm   At [facility], you and your health needs are our priority.  As part of our continuing mission to provide you with exceptional heart care, we have created designated Provider Care Teams.  These Care Teams include your primary Cardiologist (physician) and Advanced Practice Providers (APPs -  Physician Assistants and Nurse Practitioners) who all work together to provide you with the care you need, when you need it.  Thank you for choosing [facility] at [facility]!!

## 2018-09-13 NOTE — Progress Notes (Signed)
Cardiology Office Note   Date:  09/13/2018   ID:  Veronica Paul, DOB 1935/10/15, MRN [id number]  PCP:  Chevis Pretty, FNP  Cardiologist:  Dr. Percival Spanish  Chief Complaint  Patient presents with   Follow-up   Edema   Chest Pain     History of Present Illness: Veronica Paul is a 83 y.o. female who presents for complaints of lower extremity edema and fatigue.  The patient has a history of atrial fibrillation and has had recent hospitalization for A. fib with RVR.  The patient remains on Eliquis 5 mg twice daily and has recently been increased on diltiazem to 360 mg daily (she is taking 320 mg tablets);Marland Kitchen  Metoprolol has been increased to 300 mg (200 mg tablet 1-1/2 ),daily as heart rate was not well controlled.  She is normally seen in the [place] office.  The patient states that she has been having some lower extremity edema and frequent urination.  Since leaving the hospital she gets up several times a night to urinate.  She was taken off Lasix during recent hospitalization.  She does have some pitting edema in her lower extremities likely related to calcium channel blocker.  The patient was taken off of ARB (she was not taking it at home).  The patient has been taking her blood pressures at home and is found to have elevated diastolic pressures of 40-981.  Today her blood pressure is 136/80.  The patient has not gained a significant amount of weight since being seen 2 weeks ago.  She is however complaining of more fatigue and lower extremity edema compared to when she was seen last.  She has also been not eating well and complaining of constipation.  Primary care gave her Colace to help her.  Past Medical History:  Diagnosis Date   Depression    Diabetes mellitus    Dyslipidemia    HTN (hypertension)    Hypothyroidism    Lung cancer ([place])    Obesity    Osteopenia    Vitamin D deficiency     Past Surgical History:  Procedure Laterality Date   LUNG REMOVAL,  PARTIAL  1998   Right   THYROIDECTOMY       Current Outpatient Medications  Medication Sig Dispense Refill   apixaban (ELIQUIS) 5 MG TABS tablet Take 1 tablet (5 mg total) by mouth 2 (two) times daily. 60 tablet 3   blood glucose meter kit and supplies KIT Dispense based on patient and insurance preference. Use up to four times daily as directed. (FOR ICD-9 250.00, 250.01). 1 each 0   Blood Glucose Monitoring Suppl (ONETOUCH VERIO) w/Device KIT Check blood sugars daily Dx E11.9 1 kit 0   Blood Pressure Monitoring (OMRON WRIST BP MONITOR) DEVI 1 each by Does not apply route 2 (two) times a day. 1 kit 0   clonazePAM (KLONOPIN) 0.5 MG tablet Take 1 tablet (0.5 mg total) by mouth 2 (two) times daily as needed. 60 tablet 1   diltiazem (CARDIZEM CD) 120 MG 24 hr capsule Take 3 capsules (360 mg total) by mouth daily. 270 capsule 3   docusate sodium (COLACE) 100 MG capsule Take 100 mg by mouth daily.     fish oil-omega-3 fatty acids 1000 MG capsule Take 2 g by mouth daily.       glucose blood (ONETOUCH VERIO) test strip Check blood sugars daily Dx E11.9 100 each 3   Lancets (ONETOUCH ULTRASOFT) lancets Check blood sugars daily Dx E11.9 100  each 3   levothyroxine (SYNTHROID) 50 MCG tablet Take 1 tablet (50 mcg total) by mouth daily at 6 (six) AM. 30 tablet 3   metFORMIN (GLUCOPHAGE) 500 MG tablet Take 1 tablet (500 mg total) by mouth daily. 90 tablet 1   metoprolol (TOPROL-XL) 200 MG 24 hr tablet TAKE 1 AND 1/2 TABLET DAILY 135 tablet 1   nitroGLYCERIN (NITROSTAT) 0.4 MG SL tablet Place 1 tablet (0.4 mg total) under the tongue every 5 (five) minutes as needed for chest pain. 30 tablet 0   pravastatin (PRAVACHOL) 80 MG tablet Take 1 tablet (80 mg total) by mouth every other day. 30 tablet 3   hydrochlorothiazide (MICROZIDE) 12.5 MG capsule Take 1 capsule (12.5 mg total) by mouth as needed. No more than 2 capsule a week 30 capsule 3   No current facility-administered medications for  this visit.     Allergies:   Ace inhibitors, Feldene [piroxicam], Meloxicam, Prevnar [pneumococcal 13-val conj vacc], Statins, Sulfa antibiotics, and Zithromax [azithromycin dihydrate]    Social History:  The patient  reports that she has never smoked. She has never used smokeless tobacco. She reports that she does not drink alcohol or use drugs.   Family History:  The patient's family history includes Arthritis in her daughter; COPD in her daughter; Cancer in her brother; Coronary artery disease (age of onset: 5) in her brother; Fibromyalgia in her daughter; Heart disease in her brother, father, and sister; Hypertension in her brother, father, mother, sister, and sister; Stroke in her maternal grandmother.    ROS: All other systems are reviewed and negative. Unless otherwise mentioned in H&P    PHYSICAL EXAM: VS:  BP 136/80 (BP Location: Left Arm, Patient Position: Sitting, Cuff Size: Normal)    Pulse 93    Temp (!) 97.5 F (36.4 C)    Ht 5' 3.5" (1.613 m)    Wt 190 lb (86.2 kg)    LMP 05/11/1992    BMI 33.13 kg/m  , BMI Body mass index is 33.13 kg/m. GEN: Well nourished, well developed, in no acute distress HEENT: normal Neck: no JVD, carotid bruits, or masses Cardiac: IRRR, tachycardic; no murmurs, rubs, or gallops, 2+ edema in her feet, 1+ pretibial. Respiratory:  Clear to auscultation bilaterally, normal work of breathing GI: soft, nontender, nondistended, + BS MS: no deformity or atrophy Skin: warm and dry, no rash Neuro:  Strength and sensation are intact. Psych: euthymic mood, full affect, slow response to questions   EKG:   Recent Labs: 07/01/2018: ALT 14 08/17/2018: B Natriuretic Peptide 402.0; Magnesium 2.1; TSH 0.313 Sep 12, 2018: BUN 14; Creatinine, Ser 1.07; Hemoglobin 13.5; Platelets 276; Potassium 3.9; Sodium 143    Lipid Panel    Component Value Date/Time   CHOL 157 07/01/2018 0905   CHOL 100 05/07/2012 0957   TRIG 139 07/01/2018 0905   TRIG 148 06/21/2014 0932    TRIG 150 (H) 05/07/2012 0957   HDL 49 07/01/2018 0905   HDL 56 06/21/2014 0932   HDL 40 05/07/2012 0957   CHOLHDL 3.2 07/01/2018 0905   LDLCALC 80 07/01/2018 0905   LDLCALC 49 12/02/2013 0923   LDLCALC 30 05/07/2012 0957      Wt Readings from Last 3 Encounters:  09/13/18 190 lb (86.2 kg)  09/01/18 194 lb (88 kg)  12-Sep-2018 204 lb 2.3 oz (92.6 kg)      Other studies Reviewed: Echocardiogram 2018/09/12 1. The left ventricle has normal systolic function, with an ejection fraction of 55-60%. The cavity size  was normal. There is mildly increased left ventricular wall thickness. Left ventricular diastolic Doppler parameters are indeterminate. No evidence  of left ventricular regional wall motion abnormalities.  2. The right ventricle has normal systolic function. The cavity was normal. There is no increase in right ventricular wall thickness. Right ventricular systolic pressure is normal with an estimated pressure of 36.8 mmHg.  3. Left atrial size was mild-moderately dilated.  4. The aortic valve was not well visualized. Mild aortic annular calcification noted.  5. The mitral valve is grossly normal. There is mild mitral annular calcification present.  6. The tricuspid valve is grossly normal.  7. The aortic root is normal in size and structure.  ASSESSMENT AND PLAN:  1.  Atrial fibrillation: Rate is not well controlled on office visit today.  Heart rate is at 93 bpm.  I have reviewed her most recent vital signs on last office visit, heart rate at that time was 114 bpm.  Diltiazem was increased to 360 mg daily.  A cardiac monitor is being placed on her today in this office visit as they have brought the monitor with her.     With blood pressure low normal I would like to see what her heart rate is doing at home away from the office and therefore we will not make any changes at this time.  She is also on metoprolol 300 mg daily.  Due to fatigue occurring with this I have asked her to start  taking the metoprolol at at bedtime.  I have reviewed her recent labs drawn 08/18/2018 and she is not anemic.    TSH was slightly abnormal at 0.313.  She remains on levothyroxine 50 mcg daily.  She may need to have increased dose to 75 mcg.  She is due to see her primary care provider in a couple of weeks.  We will need to adjust it.  This may also be contributing to her difficulty in heart rate control.  We will see her back in a month to evaluate her response to medications and to discuss her monitor with her.  2.  Complaints of urinary frequency: She has had a history of this.  During hospitalization a UA was not completed.  Again go ahead and get this done today to see if this may be contributing to her overall fatigue and urinary frequency.  3.  Hypertension: Blood pressures normal today at 136/80.  She continues to complain of lower extremity edema likely related to the calcium channel blocker.  I have given her HCTZ 12.5 mg to take as needed (no more than twice a week) she had been on Lasix 40 mg daily and this was discontinued during recent hospitalization.  We will follow-up with the BMET prior to next visit.  4.  Anxiety: She remains on Klonopin twice daily.  She will follow-up with her primary care provider for adjustments if necessary.  5. Hyperthyroidism: Continues on levothyroxine 50 mg daily, recent labs reveal low TSH of 0.313. She will see PCP for repeat labs and need to adjust medications. Daughter states that she has a medication box which they fill for her for daily medications. As far as they know she is taking all of her medications.   Current medicines are reviewed at length with the patient today.    Labs/ tests ordered today include: UA, BMET.  Phill Myron. West Pugh, ANP, AACC   09/13/2018 4:15 PM    [facility] [address] Office (  [phone] Fax [phone]

## 2018-09-14 ENCOUNTER — Other Ambulatory Visit: Payer: Self-pay | Admitting: Nurse Practitioner

## 2018-09-14 DIAGNOSIS — H02402 Unspecified ptosis of left eyelid: Secondary | ICD-10-CM

## 2018-09-14 LAB — URINALYSIS
Bilirubin, UA: NEGATIVE
Glucose, UA: NEGATIVE
Ketones, UA: NEGATIVE
Nitrite, UA: NEGATIVE
Specific Gravity, UA: 1.01 (ref 1.005–1.030)
Urobilinogen, Ur: 0.2 mg/dL (ref 0.2–1.0)
pH, UA: 6.5 (ref 5.0–7.5)

## 2018-09-14 NOTE — Telephone Encounter (Signed)
Notified daughter of pharmacist's recommendations  Patient has taken 3 days of stool softeners with no improvement   Patient saw K. Lawrence DNP yesterday - advised to follow these recommendations from visit

## 2018-09-21 DIAGNOSIS — I4891 Unspecified atrial fibrillation: Secondary | ICD-10-CM | POA: Diagnosis not present

## 2018-09-23 ENCOUNTER — Encounter: Payer: Self-pay | Admitting: Nurse Practitioner

## 2018-09-23 ENCOUNTER — Ambulatory Visit (INDEPENDENT_AMBULATORY_CARE_PROVIDER_SITE_OTHER): Payer: Medicare HMO

## 2018-09-23 ENCOUNTER — Ambulatory Visit (INDEPENDENT_AMBULATORY_CARE_PROVIDER_SITE_OTHER): Payer: Medicare HMO | Admitting: Nurse Practitioner

## 2018-09-23 ENCOUNTER — Other Ambulatory Visit: Payer: Self-pay

## 2018-09-23 VITALS — BP 148/88 | HR 79 | Temp 96.8°F | Ht 63.0 in | Wt 191.0 lb

## 2018-09-23 DIAGNOSIS — R1900 Intra-abdominal and pelvic swelling, mass and lump, unspecified site: Secondary | ICD-10-CM

## 2018-09-23 DIAGNOSIS — K5901 Slow transit constipation: Secondary | ICD-10-CM

## 2018-09-23 DIAGNOSIS — R109 Unspecified abdominal pain: Secondary | ICD-10-CM

## 2018-09-23 NOTE — Progress Notes (Signed)
   Subjective:    Patient ID: Veronica Paul, female    DOB: [birth year], 83 y.o.   MRN: [id number]   Chief Complaint: Abdominal Pain   HPI Patient is brought in today by her daughter. She has been c/o abdominal blotting and soreness. Is unable to have a good bowel movement.   Review of Systems  Constitutional: Negative for activity change and appetite change.  HENT: Negative.   Eyes: Negative for pain.  Respiratory: Negative for shortness of breath.   Cardiovascular: Negative for chest pain, palpitations and leg swelling.  Gastrointestinal: Positive for abdominal pain and constipation.  Endocrine: Negative for polydipsia.  Genitourinary: Negative.   Skin: Negative for rash.  Neurological: Negative for dizziness, weakness and headaches.  Hematological: Does not bruise/bleed easily.  Psychiatric/Behavioral: Negative.   All other systems reviewed and are negative.      Objective:   Physical Exam Vitals signs and nursing note reviewed.  Constitutional:      General: She is not in acute distress.    Appearance: Normal appearance. She is well-developed.  HENT:     Head: Normocephalic.     Nose: Nose normal.  Eyes:     Pupils: Pupils are equal, round, and reactive to light.  Neck:     Musculoskeletal: Normal range of motion and neck supple.     Vascular: No carotid bruit or JVD.  Cardiovascular:     Rate and Rhythm: Normal rate and regular rhythm.     Heart sounds: Normal heart sounds.  Pulmonary:     Effort: Pulmonary effort is normal. No respiratory distress.     Breath sounds: Normal breath sounds. No wheezing or rales.  Chest:     Chest wall: No tenderness.  Abdominal:     General: Bowel sounds are normal. There is no distension or abdominal bruit.     Palpations: Abdomen is soft. There is no hepatomegaly, splenomegaly, mass or pulsatile mass.     Tenderness: There is generalized abdominal tenderness. There is no guarding.  Musculoskeletal: Normal range of motion.   Lymphadenopathy:     Cervical: No cervical adenopathy.  Skin:    General: Skin is warm and dry.  Neurological:     Mental Status: She is alert and oriented to person, place, and time.     Deep Tendon Reflexes: Reflexes are normal and symmetric.  Psychiatric:        Behavior: Behavior normal.        Thought Content: Thought content normal.        Judgment: Judgment normal.     BP (!) 148/88   Pulse 79   Temp (!) 96.8 F (36 C) (Oral)   Ht 5\' 3"  (1.6 m)   Wt 191 lb (86.6 kg)   LMP 05/11/1992   BMI 33.83 kg/m       Assessment & Plan:  Veronica Paul in today with chief complaint of Abdominal Pain   1. Stomach pain - DG Abd 1 View; Future  2. Slow transit constipation Milk of magnesia and 6oz of prune juice Or can try mag citrate OTC Continue daily stool softner gasx or beano for blotting Call if no better  [name], FNP

## 2018-09-23 NOTE — Patient Instructions (Signed)
Constipation, Adult Constipation is when a person:  Poops (has a bowel movement) fewer times in a week than normal.  Has a hard time pooping.  Has poop that is dry, hard, or bigger than normal. Follow these instructions at home: Eating and drinking   Eat foods that have a lot of fiber, such as: ? Fresh fruits and vegetables. ? Whole grains. ? Beans.  Eat less of foods that are high in fat, low in fiber, or overly processed, such as: ? [place] fries. ? Hamburgers. ? Cookies. ? Candy. ? Soda.  Drink enough fluid to keep your pee (urine) clear or pale yellow. General instructions  Exercise regularly or as told by your doctor.  Go to the restroom when you feel like you need to poop. Do not hold it in.  Take over-the-counter and prescription medicines only as told by your doctor. These include any fiber supplements.  Do pelvic floor retraining exercises, such as: ? Doing deep breathing while relaxing your lower belly (abdomen). ? Relaxing your pelvic floor while pooping.  Watch your condition for any changes.  Keep all follow-up visits as told by your doctor. This is important. Contact a doctor if:  You have pain that gets worse.  You have a fever.  You have not pooped for 4 days.  You throw up (vomit).  You are not hungry.  You lose weight.  You are bleeding from the anus.  You have thin, pencil-like poop (stool). Get help right away if:  You have a fever, and your symptoms suddenly get worse.  You leak poop or have blood in your poop.  Your belly feels hard or bigger than normal (is bloated).  You have very bad belly pain.  You feel dizzy or you faint. This information is not intended to replace advice given to you by your health care provider. Make sure you discuss any questions you have with your health care provider. Document Released: 07/23/2007 Document Revised: 01/16/2017 Document Reviewed: 07/25/2015 Elsevier Patient Education  2020 [employer].

## 2018-09-28 ENCOUNTER — Telehealth: Payer: Self-pay | Admitting: Nurse Practitioner

## 2018-09-28 NOTE — Telephone Encounter (Signed)
Ok to reschedule and let her go to [facility] first

## 2018-09-28 NOTE — Telephone Encounter (Signed)
Please review and advise.

## 2018-09-29 NOTE — Telephone Encounter (Signed)
Patient has a follow up appointment scheduled. 

## 2018-09-30 ENCOUNTER — Telehealth: Payer: Self-pay | Admitting: Nurse Practitioner

## 2018-09-30 ENCOUNTER — Ambulatory Visit: Payer: Medicare HMO | Admitting: Nurse Practitioner

## 2018-09-30 NOTE — Telephone Encounter (Signed)
Who found the patient had an eye problem. The order was placed on 09/14/18 before her visit on 09/23/18. PT daughter is wondering how told mmm to order. Because patient was not seen or called for one. Was this ordered on the wrong patient. Can leave message.

## 2018-09-30 NOTE — Telephone Encounter (Signed)
I have no idea- must have been put on wrong chart- just dis regard and cancel appointmet for referral.

## 2018-09-30 NOTE — Telephone Encounter (Signed)
Patient aware why she has a referral placed.  Referral placed 7/28 Dr. Katy Fitch and patient states she needs apt on a Thursday or Friday so her daughter can bring her.

## 2018-09-30 NOTE — Telephone Encounter (Signed)
Left detailed message on patients daughters voicemail

## 2018-10-01 ENCOUNTER — Ambulatory Visit (HOSPITAL_COMMUNITY)
Admission: RE | Admit: 2018-10-01 | Discharge: 2018-10-01 | Disposition: A | Discharge status: Home / Self Care | Payer: Medicare HMO | Source: Ambulatory Visit | Attending: Nurse Practitioner | Admitting: Nurse Practitioner

## 2018-10-01 ENCOUNTER — Other Ambulatory Visit: Payer: Self-pay

## 2018-10-01 ENCOUNTER — Ambulatory Visit: Payer: Self-pay | Admitting: Nurse Practitioner

## 2018-10-01 DIAGNOSIS — R935 Abnormal findings on diagnostic imaging of other abdominal regions, including retroperitoneum: Secondary | ICD-10-CM | POA: Diagnosis not present

## 2018-10-01 DIAGNOSIS — R1900 Intra-abdominal and pelvic swelling, mass and lump, unspecified site: Secondary | ICD-10-CM | POA: Diagnosis not present

## 2018-10-01 DIAGNOSIS — R140 Abdominal distension (gaseous): Secondary | ICD-10-CM | POA: Diagnosis not present

## 2018-10-13 ENCOUNTER — Ambulatory Visit: Payer: Medicare HMO | Admitting: Adult Health

## 2018-10-13 ENCOUNTER — Other Ambulatory Visit: Payer: Self-pay

## 2018-10-13 ENCOUNTER — Encounter: Payer: Self-pay | Admitting: Adult Health

## 2018-10-13 VITALS — BP 136/84 | HR 91 | Temp 97.0°F | Ht 63.0 in | Wt 186.8 lb

## 2018-10-13 DIAGNOSIS — Z789 Other specified health status: Secondary | ICD-10-CM | POA: Diagnosis not present

## 2018-10-13 DIAGNOSIS — I4891 Unspecified atrial fibrillation: Secondary | ICD-10-CM

## 2018-10-13 DIAGNOSIS — R5381 Other malaise: Secondary | ICD-10-CM | POA: Diagnosis not present

## 2018-10-13 DIAGNOSIS — I10 Essential (primary) hypertension: Secondary | ICD-10-CM

## 2018-10-13 NOTE — Progress Notes (Signed)
Cardiology Office Note   Date:  10/13/2018   ID:  Veronica, Paul Oct 10, 1935, MRN [id number]  PCP:  Chevis Pretty, FNP  Cardiologist:  De.Hochrein  Follow up Atrial fib.   History of Present Illness: Veronica Paul is a 83 y.o. female who presents for ongoing assessment and management of atrial fibrillation on Eliquis 5 mg twice daily and diltiazem along with metoprolol.  The patient is normally seen in the [place] office.  The patient also has chronic lower extremity edema and frequent urination.  She had recently been hospitalized at [facility] for A. fib RVR in July 2020.  Home blood pressures revealed elevated diastolic blood pressure between 96 and 115.  However in the office visit dated 09/13/2018 blood pressure was 136/80.  She was not fluid overloaded.  She did have some complaints of fatigue and lower extremity edema which had worsened since being discharged.  On that office visit the patient was tachycardic with heart rates of over 100 214 bpm.  Diltiazem was increased to 360 mg daily and a cardiac monitor was ordered to evaluate her heart rate and rhythm outside of the clinic setting.  TSH was reviewed and was slightly abnormal at 0.313.  She was advised to follow-up with her PCP for adjustments.  Due to frequent urination, a UA was checked to evaluate for UTI.  She was given HCTZ 12.5 mg to take as needed for lower extremity edema (no more than twice a week).  In the past she had been on Lasix 40 mg daily which was discontinued during her recent hospitalization.    Urinalysis was negative for urinary tract infection.  Lab work has yet to be completed for follow-up BMET as requested by this office visit.  Cardiac monitor which was read on 09/23/2018 revealed atrial fibrillation with controlled ventricular rate, rare ventricular ectopy.  Average heart rate was 90 bpm, minimum heart rate, 47 bpm, maximal heart rate 151 bpm.   She was here today with multiple complaints of constipation, leg  weakness,  lack of energy, and fatigue.  The patient is also unhappy with having to take so many diltiazem capsules (she is taking 3 120 mg capsules at a time).  The patient has been medically compliant denies any heart racing with higher dose of diltiazem.  She denies any lower extremity edema dizziness or chest discomfort.  Past Medical History:  Diagnosis Date  . Depression   . Diabetes mellitus   . Dyslipidemia   . HTN (hypertension)   . Hypothyroidism   . Lung cancer ([place])   . Obesity   . Osteopenia   . Vitamin D deficiency     Past Surgical History:  Procedure Laterality Date  . LUNG REMOVAL, PARTIAL  1998   Right  . THYROIDECTOMY       Current Outpatient Medications  Medication Sig Dispense Refill  . blood glucose meter kit and supplies KIT Dispense based on patient and insurance preference. Use up to four times daily as directed. (FOR ICD-9 250.00, 250.01). 1 each 0  . Blood Glucose Monitoring Suppl (ONETOUCH VERIO) w/Device KIT Check blood sugars daily Dx E11.9 1 kit 0  . Blood Pressure Monitoring (OMRON WRIST BP MONITOR) DEVI 1 each by Does not apply route 2 (two) times a day. 1 kit 0  . clonazePAM (KLONOPIN) 0.5 MG tablet Take 1 tablet (0.5 mg total) by mouth 2 (two) times daily as needed. 60 tablet 1  . diltiazem (CARDIZEM CD) 360 MG 24 hr capsule  Take 1 capsule (360 mg total) by mouth daily. 90 capsule 3  . docusate sodium (COLACE) 100 MG capsule Take 100 mg by mouth daily.    . fish oil-omega-3 fatty acids 1000 MG capsule Take 2 g by mouth daily.      Marland Kitchen glucose blood (ONETOUCH VERIO) test strip Check blood sugars daily Dx E11.9 100 each 3  . hydrochlorothiazide (MICROZIDE) 12.5 MG capsule Take 1 capsule (12.5 mg total) by mouth as needed. No more than 2 capsule a week 30 capsule 3  . Lancets (ONETOUCH ULTRASOFT) lancets Check blood sugars daily Dx E11.9 100 each 3  . metFORMIN (GLUCOPHAGE) 500 MG tablet Take 1 tablet (500 mg total) by mouth daily. 90 tablet 1  .  metoprolol (TOPROL-XL) 200 MG 24 hr tablet TAKE 1 AND 1/2 TABLET DAILY 135 tablet 1  . nitroGLYCERIN (NITROSTAT) 0.4 MG SL tablet Place 1 tablet (0.4 mg total) under the tongue every 5 (five) minutes as needed for chest pain. 30 tablet 0  . apixaban (ELIQUIS) 5 MG TABS tablet Take 1 tablet (5 mg total) by mouth 2 (two) times daily. 60 tablet 3  . levothyroxine (SYNTHROID) 50 MCG tablet Take 1 tablet (50 mcg total) by mouth daily at 6 (six) AM. 30 tablet 3  . pravastatin (PRAVACHOL) 80 MG tablet Take 1 tablet (80 mg total) by mouth every other day. 30 tablet 3   No current facility-administered medications for this visit.     Allergies:   Ace inhibitors, Feldene [piroxicam], Meloxicam, Prevnar [pneumococcal 13-val conj vacc], Statins, Sulfa antibiotics, and Zithromax [azithromycin dihydrate]    Social History:  The patient  reports that she has never smoked. She has never used smokeless tobacco. She reports that she does not drink alcohol or use drugs.   Family History:  The patient's family history includes Arthritis in her daughter; COPD in her daughter; Cancer in her brother; Coronary artery disease (age of onset: 56) in her brother; Fibromyalgia in her daughter; Heart disease in her brother, father, and sister; Hypertension in her brother, father, mother, sister, and sister; Stroke in her maternal grandmother.    ROS: All other systems are reviewed and negative. Unless otherwise mentioned in H&P    PHYSICAL EXAM: VS:  BP 136/84   Pulse 91   Temp (!) 97 F (36.1 C)   Ht _0  (1.6 m)   Wt 186 lb 12.8 oz (84.7 kg)   LMP 05/11/1992   BMI 33.09 kg/m  , BMI Body mass index is 33.09 kg/m. GEN: Well nourished, well developed, in no acute distress HEENT: normal Neck: no JVD, carotid bruits, or masses Cardiac: IRRR; no murmurs, rubs, or gallops,no edema  Respiratory:  Clear to auscultation bilaterally, normal work of breathing GI: soft, nontender, nondistended, + BS MS: no deformity or  atrophy Skin: warm and dry, no rash Neuro:  Strength and sensation are intact Psych: euthymic mood, full affect   EKG: Atrial fibrillation, heart rate of 90 bpm with incomplete right bundle branch block.  Recent Labs: 07/01/2018: ALT 14 08/17/2018: B Natriuretic Peptide 402.0; Magnesium 2.1; TSH 0.313 08/18/2018: BUN 14; Creatinine, Ser 1.07; Hemoglobin 13.5; Platelets 276; Potassium 3.9; Sodium 143    Lipid Panel    Component Value Date/Time   CHOL 157 07/01/2018 0905   CHOL 100 05/07/2012 0957   TRIG 139 07/01/2018 0905   TRIG 148 06/21/2014 0932   TRIG 150 (H) 05/07/2012 0957   HDL 49 07/01/2018 0905   HDL 56 06/21/2014 0932  HDL 40 05/07/2012 0957   CHOLHDL 3.2 07/01/2018 0905   LDLCALC 80 07/01/2018 0905   LDLCALC 49 12/02/2013 0923   LDLCALC 30 05/07/2012 0957      Wt Readings from Last 3 Encounters:  10/13/18 186 lb 12.8 oz (84.7 kg)  09/23/18 191 lb (86.6 kg)  09/13/18 190 lb (86.2 kg)      Other studies Reviewed:  Echo 08/18/2018 1. The left ventricle has normal systolic function, with an ejection fraction of 55-60%. The cavity size was normal. There is mildly increased left ventricular wall thickness. Left ventricular diastolic Doppler parameters are indeterminate. No evidence  of left ventricular regional wall motion abnormalities.  2. The right ventricle has normal systolic function. The cavity was normal. There is no increase in right ventricular wall thickness. Right ventricular systolic pressure is normal with an estimated pressure of 36.8 mmHg.  3. Left atrial size was mild-moderately dilated.  4. The aortic valve was not well visualized. Mild aortic annular calcification noted.  5. The mitral valve is grossly normal. There is mild mitral annular calcification present.  6. The tricuspid valve is grossly normal.  7. The aortic root is normal in size and structure.  ASSESSMENT AND PLAN:  1.  Atrial fibrillation: Rate is improved with increased dose of  diltiazem to 360 mg daily.  We will notify the pharmacy to give her a capsule which allows for 1 dose versus 3 separate 120 mg capsules that she has to take to help her with ease of administration.  The patient will continue with apixaban 5 mg twice daily as directed.  There is no evidence of bleeding or excessive bruising.  2.  Hypertension: Blood pressure was normal but heart rate was elevated when she first came in.  I rechecked it in the clinic and blood pressure decreased to 132/72 with a heart rate of 88 bpm.  Continue current medication regimen.  3.  Chronic constipation: The patient has been seen by PCP and is on laxatives, stool softeners, and prune juice.  She is to follow-up with PCP for ongoing management.  Calcium channel blockers have been known to cause some constipation.  This may be part of the reason she is having these symptoms.  4.  Deconditioning: The patient is not active at all, has stopped walking except for within her home from room to room.  She is to walk outside, go to [employer], be out in her yard.  That all changed after discharge from the hospital a month or so ago.  I am going to have PHN go out and see her and assess her for need for physical therapy and/or mechanical assistance with walker or cane.  I explained to her that is important for her to continue to be as active as possible so that she will not have worsening muscle weakness and to keep blood flowing to her heart and lungs.  She verbalizes understanding  Current medicines are reviewed at length with the patient today.    Labs/ tests ordered today include: None.  Phill Myron. West Pugh, ANP, AACC   10/13/2018 4:41 PM    [facility] [place] [address] Office [phone] Fax [phone]

## 2018-10-13 NOTE — Patient Instructions (Signed)
Medication Instructions:  Continue current medications  If you need a refill on your cardiac medications before your next appointment, please call your pharmacy.  Labwork: None Ordered  \ Testing/Procedures: None Ordered  Follow-Up: . Keep appointment with Dr Percival Spanish in October  You have been referred to [place], you and your health needs are our priority.  As part of our continuing mission to provide you with exceptional heart care, we have created designated Provider Care Teams.  These Care Teams include your primary Cardiologist (physician) and Advanced Practice Providers (APPs -  Physician Assistants and Nurse Practitioners) who all work together to provide you with the care you need, when you need it.  Thank you for choosing [facility] at [facility]!!

## 2018-10-13 NOTE — Addendum Note (Signed)
Addended by: Vennie Homans on: 10/13/2018 04:56 PM   Modules accepted: Orders

## 2018-10-14 ENCOUNTER — Telehealth: Payer: Self-pay | Admitting: Nurse Practitioner

## 2018-10-14 NOTE — Telephone Encounter (Signed)
Patients daughter notified and verbalized understanding 

## 2018-10-14 NOTE — Telephone Encounter (Signed)
Daughter states that losartan was changed back in June because the pharmacy could not get it. When she went to pick up meds the pharmacy asked her about that rx and stated they had it on hold for her. She wants to know if what she is taking is replacing the losartan or if she needs to start the losartan back? Please advise

## 2018-10-14 NOTE — Telephone Encounter (Signed)
If blood pressure has been good without it then she does not need it.

## 2018-10-16 LAB — HM DIABETES EYE EXAM

## 2018-10-21 ENCOUNTER — Other Ambulatory Visit: Payer: Self-pay

## 2018-10-22 ENCOUNTER — Encounter: Payer: Self-pay | Admitting: Nurse Practitioner

## 2018-10-22 ENCOUNTER — Ambulatory Visit (INDEPENDENT_AMBULATORY_CARE_PROVIDER_SITE_OTHER): Payer: Medicare HMO | Admitting: Nurse Practitioner

## 2018-10-22 ENCOUNTER — Ambulatory Visit (INDEPENDENT_AMBULATORY_CARE_PROVIDER_SITE_OTHER): Payer: Medicare HMO

## 2018-10-22 VITALS — BP 140/81 | HR 87 | Temp 96.9°F | Ht 63.0 in | Wt 186.0 lb

## 2018-10-22 DIAGNOSIS — R140 Abdominal distension (gaseous): Secondary | ICD-10-CM

## 2018-10-22 DIAGNOSIS — R609 Edema, unspecified: Secondary | ICD-10-CM

## 2018-10-22 DIAGNOSIS — E782 Mixed hyperlipidemia: Secondary | ICD-10-CM

## 2018-10-22 DIAGNOSIS — F411 Generalized anxiety disorder: Secondary | ICD-10-CM

## 2018-10-22 DIAGNOSIS — I4891 Unspecified atrial fibrillation: Secondary | ICD-10-CM | POA: Diagnosis not present

## 2018-10-22 DIAGNOSIS — E119 Type 2 diabetes mellitus without complications: Secondary | ICD-10-CM | POA: Diagnosis not present

## 2018-10-22 DIAGNOSIS — E876 Hypokalemia: Secondary | ICD-10-CM

## 2018-10-22 DIAGNOSIS — I10 Essential (primary) hypertension: Secondary | ICD-10-CM

## 2018-10-22 DIAGNOSIS — F3341 Major depressive disorder, recurrent, in partial remission: Secondary | ICD-10-CM

## 2018-10-22 DIAGNOSIS — Z6834 Body mass index (BMI) 34.0-34.9, adult: Secondary | ICD-10-CM

## 2018-10-22 DIAGNOSIS — R69 Illness, unspecified: Secondary | ICD-10-CM | POA: Diagnosis not present

## 2018-10-22 DIAGNOSIS — E038 Other specified hypothyroidism: Secondary | ICD-10-CM

## 2018-10-22 DIAGNOSIS — M8588 Other specified disorders of bone density and structure, other site: Secondary | ICD-10-CM | POA: Diagnosis not present

## 2018-10-22 DIAGNOSIS — R600 Localized edema: Secondary | ICD-10-CM

## 2018-10-22 LAB — LIPID PANEL
Chol/HDL Ratio: 3.4 ratio (ref 0.0–4.4)
Cholesterol, Total: 163 mg/dL (ref 100–199)
HDL: 48 mg/dL (ref >39)
LDL Chol Calc (NIH): 95 mg/dL (ref 0–99)
Triglycerides: 110 mg/dL (ref 0–149)
VLDL Cholesterol Cal: 20 mg/dL (ref 5–40)

## 2018-10-22 LAB — CMP14+EGFR
ALT: 26 IU/L (ref 0–32)
AST: 25 IU/L (ref 0–40)
Albumin/Globulin Ratio: 2 (ref 1.2–2.2)
Albumin: 4.4 g/dL (ref 3.6–4.6)
Alkaline Phosphatase: 76 IU/L (ref 39–117)
BUN/Creatinine Ratio: 9 — ABNORMAL LOW (ref 12–28)
BUN: 10 mg/dL (ref 8–27)
Bilirubin Total: 0.5 mg/dL (ref 0.0–1.2)
CO2: 24 mmol/L (ref 20–29)
Calcium: 9.3 mg/dL (ref 8.7–10.3)
Chloride: 102 mmol/L (ref 96–106)
Creatinine, Ser: 1.16 mg/dL — ABNORMAL HIGH (ref 0.57–1.00)
GFR calc Af Amer: 51 mL/min/{1.73_m2} — ABNORMAL LOW (ref >59)
GFR calc non Af Amer: 44 mL/min/{1.73_m2} — ABNORMAL LOW (ref >59)
Globulin, Total: 2.2 g/dL (ref 1.5–4.5)
Glucose: 103 mg/dL — ABNORMAL HIGH (ref 65–99)
Potassium: 4.3 mmol/L (ref 3.5–5.2)
Sodium: 142 mmol/L (ref 134–144)
Total Protein: 6.6 g/dL (ref 6.0–8.5)

## 2018-10-22 LAB — BAYER DCA HB A1C WAIVED: HB A1C (BAYER DCA - WAIVED): 5.9 % (ref <7.0)

## 2018-10-22 MED ORDER — METOPROLOL SUCCINATE ER 200 MG PO TB24: ORAL_TABLET | ORAL | 1 refills | Status: DC

## 2018-10-22 MED ORDER — APIXABAN 5 MG PO TABS: 5.0000 mg | ORAL_TABLET | Freq: Two times a day (BID) | ORAL | 3 refills | Status: DC

## 2018-10-22 MED ORDER — HYDROCHLOROTHIAZIDE 12.5 MG PO CAPS: 12.5000 mg | ORAL_CAPSULE | ORAL | 1 refills | Status: DC | PRN

## 2018-10-22 MED ORDER — CLONAZEPAM 0.5 MG PO TABS: 0.5000 mg | ORAL_TABLET | Freq: Two times a day (BID) | ORAL | 3 refills | Status: DC | PRN

## 2018-10-22 MED ORDER — DILTIAZEM HCL ER COATED BEADS 360 MG PO CP24: 360.0000 mg | ORAL_CAPSULE | Freq: Every day | ORAL | 3 refills | Status: DC

## 2018-10-22 MED ORDER — LEVOTHYROXINE SODIUM 50 MCG PO TABS: 50.0000 ug | ORAL_TABLET | Freq: Every day | ORAL | 1 refills | Status: DC

## 2018-10-22 MED ORDER — PRAVASTATIN SODIUM 80 MG PO TABS: 80.0000 mg | ORAL_TABLET | ORAL | 1 refills | Status: DC

## 2018-10-22 NOTE — Patient Instructions (Signed)
Abdominal Bloating When you have abdominal bloating, your abdomen may feel full, tight, or painful. It may also look bigger than normal or swollen (distended). Common causes of abdominal bloating include:  Swallowing air.  Constipation.  Problems digesting food.  Eating too much.  Irritable bowel syndrome. This is a condition that affects the large intestine.  Lactose intolerance. This is an inability to digest lactose, a natural sugar in dairy products.  Celiac disease. This is a condition that affects the ability to digest gluten, a protein found in some grains.  Gastroparesis. This is a condition that slows down the movement of food in the stomach and small intestine. It is more common in people with diabetes mellitus.  Gastroesophageal reflux disease (GERD). This is a digestive condition that makes stomach acid flow back into the esophagus.  Urinary retention. This means that the body is holding onto urine, and the bladder cannot be emptied all the way. Follow these instructions at home: Eating and drinking  Avoid eating too much.  Try not to swallow air while talking or eating.  Avoid eating while lying down.  Avoid these foods and drinks: ? Foods that cause gas, such as broccoli, cabbage, cauliflower, and baked beans. ? Carbonated drinks. ? Hard candy. ? Chewing gum. Medicines  Take over-the-counter and prescription medicines only as told by your health care provider.  Take probiotic medicines. These medicines contain live bacteria or yeasts that can help digestion.  Take coated peppermint oil capsules. Activity  Try to exercise regularly. Exercise may help to relieve bloating that is caused by gas and relieve constipation. General instructions  Keep all follow-up visits as told by your health care provider. This is important. Contact a health care provider if:  You have nausea and vomiting.  You have diarrhea.  You have abdominal pain.  You have unusual  weight loss or weight gain.  You have severe pain, and medicines do not help. Get help right away if:  You have severe chest pain.  You have trouble breathing.  You have shortness of breath.  You have trouble urinating.  You have darker urine than normal.  You have blood in your stools or have dark, tarry stools. Summary  Abdominal bloating means that the abdomen is swollen.  Common causes of abdominal bloating are swallowing air, constipation, and problems digesting food.  Avoid eating too much and avoid swallowing air.  Avoid foods that cause gas, carbonated drinks, hard candy, and chewing gum. This information is not intended to replace advice given to you by your health care provider. Make sure you discuss any questions you have with your health care provider. Document Released: 03/07/2016 Document Revised: 05/24/2018 Document Reviewed: 03/07/2016 Elsevier Patient Education  2020 [employer].

## 2018-10-22 NOTE — Progress Notes (Signed)
Subjective:    Patient ID: Veronica Paul, female    DOB: 1936/01/24, 83 y.o.   MRN: [id number]   Chief Complaint: Medical Management of Chronic Issues    HPI:  1. Essential hypertension No c/o chest pain,sob or headache. Does check blood pressure at home and stays around 016 systolic. BP Readings from Last 3 Encounters:  10/22/18 140/81  10/13/18 136/84  09/23/18 (!) 148/88     2. Mixed hyperlipidemia Eats whatever she wants to. Does no exercise  3. Diabetes mellitus without complication ([name]) Her fasting blood sugars have been running around 110-120. She does not watch her diet. Lab Results  Component Value Date   HGBA1C 5.7 07/01/2018     4. Other specified hypothyroidism No problems that aware of  5. Hypokalemia denies lower ext cramoing  6. Atrial fibrillation with RVR ([facility]) Denies palpitations or sob. She is on cardizem for rate contorl and eliquis.  7. Osteopenia of other site Aged out of dexascans  8. Peripheral edema Denies any lower ext edema today  9. GAD (generalized anxiety disorder) Takes klonopin bid. Stays very anxious and nervous. GAD 7 : Generalized Anxiety Score 10/22/2018 11/05/2017 10/02/2016  Nervous, Anxious, on Edge 1 1 0  Control/stop worrying 0 3 0  Worry too much - different things 0 3 0  Trouble relaxing 0 0 0  Restless 0 0 0  Easily annoyed or irritable 0 0 0  Afraid - awful might happen 0 0 0  Total GAD 7 Score 1 7 0  Anxiety Difficulty Not difficult at all Not difficult at all Not difficult at all    10. Recurrent major depressive disorder, in partial remission (Veronica Paul) Denies any depression. Depression screen Veronica Paul 2/9 10/22/2018 09/23/2018 08/17/2018  Decreased Interest 0 0 0  Down, Depressed, Hopeless 0 0 0  PHQ - 2 Score 0 0 0  Altered sleeping - - -  Tired, decreased energy - - -  Change in appetite - - -  Feeling bad or failure about yourself  - - -  Trouble concentrating - - -  Moving slowly or fidgety/restless - - -   Suicidal thoughts - - -  PHQ-9 Score - - -  Difficult doing work/chores - - -  Some recent data might be hidden     11. BMI 34.0-34.9,adult No recent weight changes.    Outpatient Encounter Medications as of 10/22/2018  Medication Sig  . Blood Glucose Monitoring Suppl (ONETOUCH VERIO) w/Device KIT Check blood sugars daily Dx E11.9  . clonazePAM (KLONOPIN) 0.5 MG tablet Take 1 tablet (0.5 mg total) by mouth 2 (two) times daily as needed.  . diltiazem (CARDIZEM CD) 360 MG 24 hr capsule Take 1 capsule (360 mg total) by mouth daily.  Marland Kitchen docusate sodium (COLACE) 100 MG capsule Take 100 mg by mouth daily.  . fish oil-omega-3 fatty acids 1000 MG capsule Take 2 g by mouth daily.    Marland Kitchen glucose blood (ONETOUCH VERIO) test strip Check blood sugars daily Dx E11.9  . hydrochlorothiazide (MICROZIDE) 12.5 MG capsule Take 1 capsule (12.5 mg total) by mouth as needed. No more than 2 capsule a week  . Lancets (ONETOUCH ULTRASOFT) lancets Check blood sugars daily Dx E11.9  . metFORMIN (GLUCOPHAGE) 500 MG tablet Take 1 tablet (500 mg total) by mouth daily.  . metoprolol (TOPROL-XL) 200 MG 24 hr tablet TAKE 1 AND 1/2 TABLET DAILY  . nitroGLYCERIN (NITROSTAT) 0.4 MG SL tablet Place 1 tablet (0.4 mg total) under the  tongue every 5 (five) minutes as needed for chest pain.  Marland Kitchen apixaban (ELIQUIS) 5 MG TABS tablet Take 1 tablet (5 mg total) by mouth 2 (two) times daily.  Marland Kitchen levothyroxine (SYNTHROID) 50 MCG tablet Take 1 tablet (50 mcg total) by mouth daily at 6 (six) AM.  . pravastatin (PRAVACHOL) 80 MG tablet Take 1 tablet (80 mg total) by mouth every other day.     Past Surgical History:  Procedure Laterality Date  . LUNG REMOVAL, PARTIAL  1998   Right  . THYROIDECTOMY      Family History  Problem Relation Age of Onset  . Coronary artery disease Brother 25  . Hypertension Brother   . Heart disease Brother   . Hypertension Mother   . Hypertension Father   . Heart disease Father   . Hypertension Sister    . Heart disease Sister   . Arthritis Daughter   . COPD Daughter   . Fibromyalgia Daughter   . Stroke Maternal Grandmother   . Cancer Brother   . Hypertension Sister   . Colon cancer Neg Hx   . Food intolerance Neg Hx     New complaints: Patient is still c/c feeling bloated.  Social history: Lives by herself  Controlled substance contract: 10/22/18    Review of Systems  Constitutional: Negative for activity change and appetite change.  HENT: Negative.   Eyes: Negative for pain.  Respiratory: Negative for shortness of breath.   Cardiovascular: Negative for chest pain, palpitations and leg swelling.  Gastrointestinal: Positive for constipation (? thinks may be causing bloating). Negative for abdominal pain.  Endocrine: Negative for polydipsia.  Genitourinary: Negative.   Skin: Negative for rash.  Neurological: Negative for dizziness, weakness and headaches.  Hematological: Does not bruise/bleed easily.  Psychiatric/Behavioral: Negative.   All other systems reviewed and are negative.      Objective:   Physical Exam Vitals signs and nursing note reviewed.  Constitutional:      General: She is not in acute distress.    Appearance: Normal appearance. She is well-developed.  HENT:     Head: Normocephalic.     Nose: Nose normal.  Eyes:     Pupils: Pupils are equal, round, and reactive to light.  Neck:     Musculoskeletal: Normal range of motion and neck supple.     Vascular: No carotid bruit or JVD.  Cardiovascular:     Rate and Rhythm: Normal rate and regular rhythm.     Heart sounds: Normal heart sounds.  Pulmonary:     Effort: Pulmonary effort is normal. No respiratory distress.     Breath sounds: Normal breath sounds. No wheezing or rales.  Chest:     Chest wall: No tenderness.  Abdominal:     General: Bowel sounds are normal. There is no distension or abdominal bruit.     Palpations: Abdomen is soft. There is no hepatomegaly, splenomegaly, mass or pulsatile  mass.     Tenderness: There is no abdominal tenderness.  Musculoskeletal: Normal range of motion.  Lymphadenopathy:     Cervical: No cervical adenopathy.  Skin:    General: Skin is warm and dry.  Neurological:     Mental Status: She is alert and oriented to person, place, and time.     Deep Tendon Reflexes: Reflexes are normal and symmetric.  Psychiatric:        Behavior: Behavior normal.        Thought Content: Thought content normal.  Judgment: Judgment normal.    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 Essential hypertension Low sodium diet - CMP14+EGFR - metoprolol (TOPROL-XL) 200 MG 24 hr tablet; TAKE 1 AND 1/2 TABLET DAILY  Dispense: 135 tablet; Refill: 1 - diltiazem (CARDIZEM CD) 360 MG 24 hr capsule; Take 1 capsule (360 mg total) by mouth daily.  Dispense: 90 capsule; Refill: 3 - hydrochlorothiazide (MICROZIDE) 12.5 MG capsule; Take 1 capsule (12.5 mg total) by mouth as needed. No more than 2 capsule a week  Dispense: 90 capsule; Refill: 1  2. Mixed hyperlipidemia Low fat diet - Lipid panel - pravastatin (PRAVACHOL) 80 MG tablet; Take 1 tablet (80 mg total) by mouth every other day.  Dispense: 90 tablet; Refill: 1  3. Diabetes mellitus without complication ([place]) Continue to watch carbsin diet - Bayer DCA Hb A1c Waived  4. Other specified hypothyroidism - Thyroid Panel With TSH - levothyroxine (SYNTHROID) 50 MCG tablet; Take 1 tablet (50 mcg total) by mouth daily at 6 (six) AM.  Dispense: 90 tablet; Refill: 1  5. Hypokalemia  6. Atrial fibrillation with RVR ([facility]) - apixaban (ELIQUIS) 5  MG TABS tablet; Take 1 tablet (5 mg total) by mouth 2 (two) times daily.  Dispense: 60 tablet; Refill: 3  7. Osteopenia of other site  8. Peripheral edema Elevate legs when sitting  9. GAD (generalized anxiety disorder) Stress management - clonazePAM (KLONOPIN) 0.5 MG tablet; Take 1 tablet (0.5 mg total) by mouth 2 (two) times daily as needed.  Dispense: 60 tablet; Refill: 3  10. Recurrent major depressive disorder, in partial remission ([place])  11. BMI 34.0-34.9,adult Discussed diet and exercise for person with BMI >25 Will recheck weight in 3-6 months  12. Bloated abdomen - DG Abd 1 View; Future miralax daily in apple juice  13. Abdominal distension (gaseous) - CT Abdomen Pelvis Wo Contrast   Labs pending Health Maintenance reviewed Diet and exercise encouraged  Follow up plan: 3 months   [name], FNP

## 2018-10-23 ENCOUNTER — Other Ambulatory Visit: Payer: Self-pay | Admitting: Nurse Practitioner

## 2018-10-23 DIAGNOSIS — I10 Essential (primary) hypertension: Secondary | ICD-10-CM

## 2018-10-26 LAB — SPECIMEN STATUS REPORT

## 2018-10-26 LAB — THYROID PANEL WITH TSH
Free Thyroxine Index: 2.8 (ref 1.2–4.9)
T3 Uptake Ratio: 33 % (ref 24–39)
T4, Total: 8.4 ug/dL (ref 4.5–12.0)
TSH: 9.71 u[IU]/mL — ABNORMAL HIGH (ref 0.450–4.500)

## 2018-10-27 MED ORDER — LEVOTHYROXINE SODIUM 75 MCG PO TABS: 75.0000 ug | ORAL_TABLET | Freq: Every day | ORAL | 3 refills | Status: DC

## 2018-10-27 NOTE — Addendum Note (Signed)
Addended by: Chevis Pretty on: 10/27/2018 04:17 PM   Modules accepted: Orders

## 2018-11-04 DIAGNOSIS — R69 Illness, unspecified: Secondary | ICD-10-CM | POA: Diagnosis not present

## 2018-11-08 ENCOUNTER — Emergency Department (HOSPITAL_COMMUNITY)
Admission: EM | Admit: 2018-11-08 | Discharge: 2018-11-08 | Disposition: A | Discharge status: Home / Self Care | Payer: Medicare HMO | Attending: Emergency Medicine | Admitting: Emergency Medicine

## 2018-11-08 ENCOUNTER — Other Ambulatory Visit: Payer: Self-pay

## 2018-11-08 ENCOUNTER — Emergency Department (HOSPITAL_COMMUNITY): Payer: Medicare HMO

## 2018-11-08 ENCOUNTER — Encounter (HOSPITAL_COMMUNITY): Payer: Self-pay

## 2018-11-08 DIAGNOSIS — R0789 Other chest pain: Secondary | ICD-10-CM | POA: Diagnosis not present

## 2018-11-08 DIAGNOSIS — E119 Type 2 diabetes mellitus without complications: Secondary | ICD-10-CM | POA: Diagnosis not present

## 2018-11-08 DIAGNOSIS — R079 Chest pain, unspecified: Secondary | ICD-10-CM | POA: Diagnosis not present

## 2018-11-08 DIAGNOSIS — Z79899 Other long term (current) drug therapy: Secondary | ICD-10-CM | POA: Insufficient documentation

## 2018-11-08 DIAGNOSIS — Z7901 Long term (current) use of anticoagulants: Secondary | ICD-10-CM | POA: Diagnosis not present

## 2018-11-08 DIAGNOSIS — Z85118 Personal history of other malignant neoplasm of bronchus and lung: Secondary | ICD-10-CM | POA: Insufficient documentation

## 2018-11-08 DIAGNOSIS — E039 Hypothyroidism, unspecified: Secondary | ICD-10-CM | POA: Diagnosis not present

## 2018-11-08 DIAGNOSIS — I10 Essential (primary) hypertension: Secondary | ICD-10-CM | POA: Diagnosis not present

## 2018-11-08 DIAGNOSIS — Z7984 Long term (current) use of oral hypoglycemic drugs: Secondary | ICD-10-CM | POA: Diagnosis not present

## 2018-11-08 LAB — CBC
HCT: 45.2 % (ref 36.0–46.0)
Hemoglobin: 13.7 g/dL (ref 12.0–15.0)
MCH: 27.5 pg (ref 26.0–34.0)
MCHC: 30.3 g/dL (ref 30.0–36.0)
MCV: 90.6 fL (ref 80.0–100.0)
Platelets: 265 10*3/uL (ref 150–400)
RBC: 4.99 MIL/uL (ref 3.87–5.11)
RDW: 14.6 % (ref 11.5–15.5)
WBC: 7.4 10*3/uL (ref 4.0–10.5)
nRBC: 0 % (ref 0.0–0.2)

## 2018-11-08 LAB — BASIC METABOLIC PANEL
Anion gap: 11 (ref 5–15)
BUN: 9 mg/dL (ref 8–23)
CO2: 26 mmol/L (ref 22–32)
Calcium: 9 mg/dL (ref 8.9–10.3)
Chloride: 104 mmol/L (ref 98–111)
Creatinine, Ser: 0.85 mg/dL (ref 0.44–1.00)
GFR calc Af Amer: >60 mL/min (ref >60)
GFR calc non Af Amer: >60 mL/min (ref >60)
Glucose, Bld: 122 mg/dL — ABNORMAL HIGH (ref 70–99)
Potassium: 3.9 mmol/L (ref 3.5–5.1)
Sodium: 141 mmol/L (ref 135–145)

## 2018-11-08 LAB — TROPONIN I (HIGH SENSITIVITY)
Troponin I (High Sensitivity): 3 ng/L (ref <18)
Troponin I (High Sensitivity): 3 ng/L (ref <18)

## 2018-11-08 MED ORDER — SODIUM CHLORIDE 0.9% FLUSH: 3.0000 mL | Freq: Once | INTRAVENOUS | Status: DC

## 2018-11-08 NOTE — Discharge Instructions (Signed)
He was seen today for chest pain.  Your cardiac enzymes and additional lab work as well as her EKG and chest x-ray were all reassuring.  Please make sure to call your cardiologist tomorrow to schedule a follow up appointment.  Continue to take your clonazepam as needed for the chest pain. Thank you for allowing me to care for you today. Please return to the emergency department if you have new or worsening symptoms.

## 2018-11-08 NOTE — ED Triage Notes (Signed)
Pt has been having chest pain for "quite a while". States as far back as June 30th. Was hospitalized for this prior. Pain in center of chest

## 2018-11-08 NOTE — ED Provider Notes (Addendum)
[facility] EMERGENCY DEPARTMENT Provider Note   CSN: [id number] Arrival date & time: 11/08/18  1249     History   Chief Complaint Chief Complaint  Patient presents with  . Chest Pain    HPI Veronica Paul is a 83 y.o. female.     Patient is an 83 year old female past medical history of atrial fibrillation, hypothyroidism, hypertension, diabetes, chest pain who presents the emergency department for chest pain.  Patient reports that this is been off and on since July but that today's episode has been going on since about 6 AM.  Reports that she woke up with the pain.  It is not exertional.  Reports that it is pinpoint on the left side of her chest.  Reports that it did get relieved with taking her clonazepam for some time but then came back.  Denies any nausea, vomiting, radiation to the jaw or arms.     Past Medical History:  Diagnosis Date  . Depression   . Diabetes mellitus   . Dyslipidemia   . HTN (hypertension)   . Hypothyroidism   . Lung cancer ([place])   . Obesity   . Osteopenia   . Vitamin D deficiency     Patient Active Problem List   Diagnosis Date Noted  . Atrial fibrillation with RVR ([place]) 08/17/2018  . Hypokalemia 08/17/2018  . Hypothyroidism 05/23/2013  . Diabetes mellitus without complication ([place]) 25/42/7062  . GAD (generalized anxiety disorder) 02/09/2013  . Depression 06/25/2012  . Peripheral edema 06/25/2012  . Osteopenia   . Hypertension   . Abnormal EKG   . Hyperlipidemia   . BMI 34.0-34.9,adult     Past Surgical History:  Procedure Laterality Date  . LUNG REMOVAL, PARTIAL  1998   Right  . THYROIDECTOMY       OB History   No obstetric history on file.      Home Medications    Prior to Admission medications   Medication Sig Start Date End Date Taking? Authorizing Provider  apixaban (ELIQUIS) 5 MG TABS tablet Take 1 tablet (5 mg total) by mouth 2 (two) times daily. 10/22/18 11/21/18  Chevis Pretty, FNP  Blood Glucose  Monitoring Suppl [facility] VERIO) w/Device KIT Check blood sugars daily Dx E11.9 08/24/18   Hassell Done, Mary-Margaret, FNP  clonazePAM (KLONOPIN) 0.5 MG tablet Take 1 tablet (0.5 mg total) by mouth 2 (two) times daily as needed. 10/22/18   Hassell Done, Mary-Margaret, FNP  diltiazem (CARDIZEM CD) 360 MG 24 hr capsule Take 1 capsule (360 mg total) by mouth daily. 10/22/18   Hassell Done, Mary-Margaret, FNP  docusate sodium (COLACE) 100 MG capsule Take 100 mg by mouth daily.    [provider]  fish oil-omega-3 fatty acids 1000 MG capsule Take 2 g by mouth daily.      [provider]  glucose blood (ONETOUCH VERIO) test strip Check blood sugars daily Dx E11.9 08/24/18   Hassell Done, Mary-Margaret, FNP  hydrochlorothiazide (MICROZIDE) 12.5 MG capsule Take 1 capsule (12.5 mg total) by mouth as needed. No more than 2 capsule a week 10/22/18   Chevis Pretty, FNP  Lancets [facility] ULTRASOFT) lancets Check blood sugars daily Dx E11.9 08/24/18   Chevis Pretty, FNP  levothyroxine (SYNTHROID) 75 MCG tablet Take 1 tablet (75 mcg total) by mouth daily. 10/27/18   Hassell Done, Mary-Margaret, FNP  metFORMIN (GLUCOPHAGE) 500 MG tablet Take 1 tablet (500 mg total) by mouth daily. 07/01/18   Hassell Done Mary-Margaret, FNP  metoprolol (TOPROL-XL) 200 MG 24 hr tablet TAKE 1  AND 1/2 TABLET DAILY 10/22/18   Hassell Done, Mary-Margaret, FNP  nitroGLYCERIN (NITROSTAT) 0.4 MG SL tablet PLACE 1 TABLET (0.4 MG TOTAL) UNDER THE TONGUE EVERY 5 (FIVE) MINUTES AS NEEDED FOR CHEST PAIN. 10/26/18   Hassell Done, Mary-Margaret, FNP  pravastatin (PRAVACHOL) 80 MG tablet Take 1 tablet (80 mg total) by mouth every other day. 10/22/18 11/21/18  Chevis Pretty, FNP    Family History Family History  Problem Relation Age of Onset  . Coronary artery disease Brother 34  . Hypertension Brother   . Heart disease Brother   . Hypertension Mother   . Hypertension Father   . Heart disease Father   . Hypertension Sister   . Heart disease Sister   . Arthritis  Daughter   . COPD Daughter   . Fibromyalgia Daughter   . Stroke Maternal Grandmother   . Cancer Brother   . Hypertension Sister   . Colon cancer Neg Hx   . Food intolerance Neg Hx     Social History Social History   Tobacco Use  . Smoking status: Never Smoker  . Smokeless tobacco: Never Used  Substance Use Topics  . Alcohol use: No  . Drug use: No     Allergies   Ace inhibitors, Feldene [piroxicam], Meloxicam, Prevnar [pneumococcal 13-val conj vacc], Statins, Sulfa antibiotics, and Zithromax [azithromycin dihydrate]   Review of Systems Review of Systems  Constitutional: Negative for chills and fever.  HENT: Negative for ear pain and sore throat.   Eyes: Negative for pain and visual disturbance.  Respiratory: Negative for cough and shortness of breath.   Cardiovascular: Positive for chest pain and leg swelling (Reports chronic, unchanged). Negative for palpitations.  Gastrointestinal: Negative for abdominal pain and vomiting.  Genitourinary: Negative for dysuria and hematuria.  Musculoskeletal: Negative for arthralgias and back pain.  Skin: Negative for color change and rash.  Neurological: Negative for dizziness, seizures, syncope and light-headedness.  All other systems reviewed and are negative.    Physical Exam Updated Vital Signs BP (!) 147/88 (BP Location: Left Arm)   Pulse 87   Temp 97.9 F (36.6 C) (Oral)   Resp 16   Ht _0  (1.6 m)   Wt 81.6 kg   LMP 05/11/1992   SpO2 99%   BMI 31.89 kg/m   Physical Exam Vitals signs and nursing note reviewed.  Constitutional:      Appearance: Normal appearance.  HENT:     Head: Normocephalic.  Eyes:     Conjunctiva/sclera: Conjunctivae normal.     Pupils: Pupils are equal, round, and reactive to light.  Cardiovascular:     Rate and Rhythm: Normal rate. Rhythm irregular.     Pulses:          Radial pulses are 1+ on the right side and 1+ on the left side.  Pulmonary:     Effort: Pulmonary effort is normal.      Breath sounds: Normal breath sounds.  Chest:     Chest wall: No tenderness.  Abdominal:     General: Bowel sounds are normal.     Palpations: Abdomen is soft.  Musculoskeletal:     Right lower leg: Edema present.     Left lower leg: Edema present.     Comments: Bilateral 1+ pitting edema.  Negative Homans.  Skin:    General: Skin is dry.  Neurological:     General: No focal deficit present.     Mental Status: She is alert.  Psychiatric:  Mood and Affect: Mood normal.      ED Treatments / Results  Labs (all labs ordered are listed, but only abnormal results are displayed) Labs Reviewed  BASIC METABOLIC PANEL - Abnormal; Notable for the following components:      Result Value   Glucose, Bld 122 (*)    All other components within normal limits  CBC  TROPONIN I (HIGH SENSITIVITY)  TROPONIN I (HIGH SENSITIVITY)    EKG EKG Interpretation  Date/Time:  Monday November 08 2018 13:00:41 EDT Ventricular Rate:  80 PR Interval:    QRS Duration: 96 QT Interval:  380 QTC Calculation: 438 R Axis:   101 Text Interpretation:  Atrial fibrillation Rightward axis Low voltage QRS Incomplete right bundle branch block Cannot rule out Anteroseptal infarct , age undetermined Abnormal ECG Confirmed by Milton Ferguson [phone]) on 11/08/2018 5:08:39 PM   Radiology Dg Chest 2 View  Result Date: 11/08/2018 CLINICAL DATA:  Chest pain. EXAM: CHEST - 2 VIEW COMPARISON:  Radiographs of August 17, 2018. FINDINGS: Stable cardiomediastinal silhouette. Atherosclerosis of thoracic aorta is noted. No pneumothorax or pleural effusion is noted. Stable right basilar scarring is noted. No acute pulmonary disease is noted. Bony thorax is unremarkable. IMPRESSION: No acute cardiopulmonary abnormality seen. Aortic Atherosclerosis (ICD10-I70.0). Electronically Signed   By: Marijo Conception M.D.   On: 11/08/2018 13:25    Procedures Procedures (including critical care time)  Medications Ordered in ED  Medications  sodium chloride flush (NS) 0.9 % injection 3 mL (has no administration in time range)     Initial Impression / Assessment and Plan / ED Course  I have reviewed the triage vital signs and the nursing notes.  Pertinent labs & imaging results that were available during my care of the patient were reviewed by me and considered in my medical decision making (see chart for details).  Clinical Course as of Nov 07 1724  Mon Nov 08, 2018  1708 Atypical chest pain. Trop levels were 3 x2. Relieved at home with clonazepam. Discussed with Dr. Roderic Palau. D/c home with close cardiology f/u   [KM]    Clinical Course User Index [KM] Alveria Apley, PA-C       Based on review of vitals, medical screening exam, lab work and/or imaging, there does not appear to be an acute, emergent etiology for the patient's symptoms. Counseled pt on good return precautions and encouraged both PCP and ED follow-up as needed.  Prior to discharge, I also discussed incidental imaging findings with patient in detail and advised appropriate, recommended follow-up in detail.  Clinical Impression: 1. Atypical chest pain     Disposition: Discharge  Prior to providing a prescription for a controlled substance, I independently reviewed the patient's recent prescription history on the [facility]. The patient had no recent or regular prescriptions and was deemed appropriate for a brief, less than 3 day prescription of narcotic for acute analgesia.  This note was prepared with assistance of Systems analyst. Occasional wrong-word or sound-a-like substitutions may have occurred due to the inherent limitations of voice recognition software.   Final Clinical Impressions(s) / ED Diagnoses   Final diagnoses:  Atypical chest pain    ED Discharge Orders    None       Alveria Apley, PA-C 11/08/18 1725    Alveria Apley, PA-C 11/08/18 1726     Milton Ferguson, MD 11/11/18 1113

## 2018-11-11 ENCOUNTER — Other Ambulatory Visit: Payer: Self-pay | Admitting: Nurse Practitioner

## 2018-11-11 ENCOUNTER — Ambulatory Visit (HOSPITAL_COMMUNITY): Payer: Self-pay

## 2018-11-11 DIAGNOSIS — R1084 Generalized abdominal pain: Secondary | ICD-10-CM

## 2018-11-12 ENCOUNTER — Telehealth: Payer: Self-pay

## 2018-11-12 ENCOUNTER — Ambulatory Visit (HOSPITAL_COMMUNITY)
Admission: RE | Admit: 2018-11-12 | Discharge: 2018-11-12 | Disposition: A | Discharge status: Home / Self Care | Payer: Medicare HMO | Source: Ambulatory Visit | Attending: Nurse Practitioner | Admitting: Nurse Practitioner

## 2018-11-12 ENCOUNTER — Other Ambulatory Visit: Payer: Medicare HMO

## 2018-11-12 ENCOUNTER — Other Ambulatory Visit: Payer: Self-pay

## 2018-11-12 DIAGNOSIS — K8020 Calculus of gallbladder without cholecystitis without obstruction: Secondary | ICD-10-CM | POA: Diagnosis not present

## 2018-11-12 DIAGNOSIS — R1084 Generalized abdominal pain: Secondary | ICD-10-CM | POA: Diagnosis not present

## 2018-11-12 NOTE — Telephone Encounter (Signed)
Jan from [facility] called with a stat US report for patient;  Cholelithiasis. The gallbladder is tender but there is no wall thickening or fluid typical of acute cholecystitis.  MPulliam, CMA/RT(R)

## 2018-11-12 NOTE — Telephone Encounter (Signed)
Patient aware.

## 2018-11-16 MED ORDER — AMOXICILLIN 500 MG PO TABS: 500.0000 mg | ORAL_TABLET | Freq: Two times a day (BID) | ORAL | 0 refills | Status: DC

## 2018-11-16 MED ORDER — LEVOFLOXACIN 500 MG PO TABS: 500.0000 mg | ORAL_TABLET | Freq: Every day | ORAL | 0 refills | Status: DC

## 2018-11-16 MED ORDER — LANSOPRAZOLE 30 MG PO CPDR: 30.0000 mg | DELAYED_RELEASE_CAPSULE | Freq: Every day | ORAL | 0 refills | Status: DC

## 2018-11-16 NOTE — Progress Notes (Signed)
Patient aware of treatment plan

## 2018-11-16 NOTE — Addendum Note (Signed)
Addended by: Chevis Pretty on: 11/16/2018 08:09 AM   Modules accepted: Orders

## 2018-11-22 ENCOUNTER — Telehealth: Payer: Self-pay | Admitting: Nurse Practitioner

## 2018-11-22 DIAGNOSIS — R69 Illness, unspecified: Secondary | ICD-10-CM | POA: Diagnosis not present

## 2018-11-22 NOTE — Telephone Encounter (Signed)
LMTCB

## 2018-11-23 ENCOUNTER — Telehealth: Payer: Self-pay | Admitting: Nurse Practitioner

## 2018-11-23 ENCOUNTER — Other Ambulatory Visit: Payer: Self-pay | Admitting: Nurse Practitioner

## 2018-11-23 DIAGNOSIS — K8020 Calculus of gallbladder without cholecystitis without obstruction: Secondary | ICD-10-CM

## 2018-11-23 DIAGNOSIS — K811 Chronic cholecystitis: Secondary | ICD-10-CM

## 2018-11-23 NOTE — Telephone Encounter (Signed)
Pt had Hpylori.

## 2018-11-23 NOTE — Telephone Encounter (Signed)
Going to try to order hidascan to have gall bladder checked

## 2018-11-23 NOTE — Telephone Encounter (Signed)
probablky your gall bladder since you have gall stones- will order hydoscan

## 2018-11-23 NOTE — Telephone Encounter (Signed)
Aware. 

## 2018-11-23 NOTE — Progress Notes (Unsigned)
hhy

## 2018-11-23 NOTE — Telephone Encounter (Signed)
Patient has tele visit with MMM tomorrow will verify then.

## 2018-11-24 ENCOUNTER — Ambulatory Visit (INDEPENDENT_AMBULATORY_CARE_PROVIDER_SITE_OTHER): Payer: Medicare HMO | Admitting: Nurse Practitioner

## 2018-11-24 ENCOUNTER — Encounter: Payer: Self-pay | Admitting: Nurse Practitioner

## 2018-11-24 ENCOUNTER — Other Ambulatory Visit: Payer: Self-pay

## 2018-11-24 DIAGNOSIS — R1084 Generalized abdominal pain: Secondary | ICD-10-CM

## 2018-11-24 NOTE — Progress Notes (Signed)
   Virtual Visit via telephone Note Due to COVID-19 pandemic this visit was conducted virtually. This visit type was conducted due to national recommendations for restrictions regarding the COVID-19 Pandemic (e.g. social distancing, sheltering in place) in an effort to limit this patient's exposure and mitigate transmission in our community. All issues noted in this document were discussed and addressed.  A physical exam was not performed with this format.  I connected with Veronica Paul on 11/24/18 at 9:00 by telephone and verified that I am speaking with the correct person using two identifiers. Veronica Paul is currently located at home and no one is currently with her during visit. The provider, [name], FNP is located in their office at time of visit.  I discussed the limitations, risks, security and privacy concerns of performing an evaluation and management service by telephone and the availability of in person appointments. I also discussed with the patient that there may be a patient responsible charge related to this service. The patient expressed understanding and agreed to proceed.   History and Present Illness:   Chief Complaint: Abdominal Pain   HPI Patient calls in today stating that she is still having abdominal pain. We have been trying to find the cause of hr pain for several weeks and only thing we could find was cholelithiasis without cholecystitis. She says that the pain is still located in upper abdomen, but moves around some.  sometimes it is worse after eating. Nothing relieves the pain except her klonopin. Still taking prevacid and gasx.   Review of Systems  Constitutional: Negative for diaphoresis and weight loss.  Eyes: Negative for blurred vision, double vision and pain.  Respiratory: Negative for shortness of breath.   Cardiovascular: Negative for chest pain, palpitations, orthopnea and leg swelling.  Gastrointestinal: Positive for abdominal pain.  Negative for constipation, diarrhea, nausea and vomiting.  Skin: Negative for rash.  Neurological: Negative for dizziness, sensory change, loss of consciousness, weakness and headaches.  Endo/Heme/Allergies: Negative for polydipsia. Does not bruise/bleed easily.  Psychiatric/Behavioral: Negative for memory loss. The patient does not have insomnia.   All other systems reviewed and are negative.    Observations/Objective: Alert and oriented- answers all questions appropriately Mild  Distress rates abdominal pain 5/10 current;ly    Assessment and Plan: Veronica Paul in today with chief complaint of Abdominal Pain   1. Generalized abdominal pain Avoid greasy and fatty foods hidascan ordered and schedules   Follow Up Instructions: prn    I discussed the assessment and treatment plan with the patient. The patient was provided an opportunity to ask questions and all were answered. The patient agreed with the plan and demonstrated an understanding of the instructions.   The patient was advised to call back or seek an in-person evaluation if the symptoms worsen or if the condition fails to improve as anticipated.  The above assessment and management plan was discussed with the patient. The patient verbalized understanding of and has agreed to the management plan. Patient is aware to call the clinic if symptoms persist or worsen. Patient is aware when to return to the clinic for a follow-up visit. Patient educated on when it is appropriate to go to the emergency department.   Time call ended:  9:11  I provided 11 minutes of non-face-to-face time during this encounter.    [name], FNP

## 2018-11-26 ENCOUNTER — Encounter (HOSPITAL_COMMUNITY)
Admission: RE | Admit: 2018-11-26 | Discharge: 2018-11-26 | Disposition: A | Discharge status: Home / Self Care | Payer: Medicare HMO | Source: Ambulatory Visit | Attending: Nurse Practitioner | Admitting: Nurse Practitioner

## 2018-11-26 ENCOUNTER — Other Ambulatory Visit: Payer: Self-pay

## 2018-11-26 DIAGNOSIS — R69 Illness, unspecified: Secondary | ICD-10-CM | POA: Diagnosis not present

## 2018-11-26 DIAGNOSIS — K8020 Calculus of gallbladder without cholecystitis without obstruction: Secondary | ICD-10-CM

## 2018-11-26 MED ORDER — TECHNETIUM TC 99M MEBROFENIN IV KIT
5.0000 | PACK | Freq: Once | INTRAVENOUS | Status: AC | PRN
  Administered 2018-11-26: 4.95 via INTRAVENOUS

## 2018-11-26 NOTE — Addendum Note (Signed)
Addended by: Chevis Pretty on: 11/26/2018 03:27 PM   Modules accepted: Orders

## 2018-11-30 NOTE — Progress Notes (Signed)
Cardiology Office Note   Date:  12/01/2018   ID:  ADIA CRAMMER, DOB [birth year], MRN [id number]  PCP:  Chevis Pretty, FNP  Cardiologist:   No primary care provider on file.   Chief Complaint  Patient presents with  . Atrial Fibrillation      History of Present Illness: Veronica Paul is a 83 y.o. female who presents for follow up of atrial fib and chest pain.       She was in the ED with chest pain in late Sept.  I reviewed these records for this visit.    EKG demonstrated no acute changes and hs Trop was negative.    At the time she presented she was describing chest discomfort that was upper midsternal chest.  She is no longer having this.  Her biggest problem is her abdominal discomfort.  She feels a constant tightness around her waist.  She is actually had a gallbladder emptying study which was abnormal and she is apparently going to see gastroenterologist a little have a record of when this is.  When we last talked she was having a lot of trouble with gait and weakness but she said this has improved and she actually did not go forward with home health visits that I had suggested.  She also is having significant constipation but she says this is improved with MiraLAX.  She does not notice her heart rate.  She has no palpitations.  No presyncope.  Is not having any chest pressure, neck or arm discomfort.  He has had no weight gain.  She has a mild lower extremity chronic edema.  Past Medical History:  Diagnosis Date  . Depression   . Diabetes mellitus   . Dyslipidemia   . HTN (hypertension)   . Hypothyroidism   . Lung cancer ([place])   . Obesity   . Osteopenia   . Vitamin D deficiency     Past Surgical History:  Procedure Laterality Date  . LUNG REMOVAL, PARTIAL  1998   Right  . THYROIDECTOMY       Current Outpatient Medications  Medication Sig Dispense Refill  . apixaban (ELIQUIS) 5 MG TABS tablet Take 1 tablet (5 mg total) by mouth 2 (two) times daily.  60 tablet 3  . clonazePAM (KLONOPIN) 0.5 MG tablet Take 1 tablet (0.5 mg total) by mouth 2 (two) times daily as needed. 60 tablet 3  . diltiazem (CARDIZEM CD) 360 MG 24 hr capsule Take 1 capsule (360 mg total) by mouth daily. 90 capsule 3  . docusate sodium (COLACE) 100 MG capsule Take 100 mg by mouth daily.    . fish oil-omega-3 fatty acids 1000 MG capsule Take 2 g by mouth daily.      Marland Kitchen levothyroxine (SYNTHROID) 75 MCG tablet Take 1 tablet (75 mcg total) by mouth daily. 90 tablet 3  . metFORMIN (GLUCOPHAGE) 500 MG tablet Take 1 tablet (500 mg total) by mouth daily. 90 tablet 1  . metoprolol (TOPROL-XL) 200 MG 24 hr tablet TAKE 1 AND 1/2 TABLET DAILY 135 tablet 1  . nitroGLYCERIN (NITROSTAT) 0.4 MG SL tablet PLACE 1 TABLET (0.4 MG TOTAL) UNDER THE TONGUE EVERY 5 (FIVE) MINUTES AS NEEDED FOR CHEST PAIN. 25 tablet 1  . pravastatin (PRAVACHOL) 80 MG tablet Take 1 tablet (80 mg total) by mouth every other day. 90 tablet 1  . Blood Glucose Monitoring Suppl (ONETOUCH VERIO) w/Device KIT Check blood sugars daily Dx E11.9 1 kit 0  . furosemide (  LASIX) 20 MG tablet Take 0.5 tablets (10 mg total) by mouth 3 (three) times a week. Take 1/2 tablet as needed 3 times weekly 15 tablet 3  . glucose blood (ONETOUCH VERIO) test strip Check blood sugars daily Dx E11.9 100 each 3  . Lancets (ONETOUCH ULTRASOFT) lancets Check blood sugars daily Dx E11.9 100 each 3   No current facility-administered medications for this visit.     Allergies:   Ace inhibitors, Feldene [piroxicam], Meloxicam, Prevnar [pneumococcal 13-val conj vacc], Statins, Sulfa antibiotics, and Zithromax [azithromycin dihydrate]    ROS:  Please see the history of present illness.   Otherwise, review of systems are positive for onstipation, leg weakness,  lack of energy, and fatigue none.   All other systems are reviewed and negative.    PHYSICAL EXAM: VS:  BP 138/70   Pulse 84   Ht 5' 3.5" (1.613 m)   Wt 186 lb (84.4 kg)   LMP 05/11/1992    BMI 32.43 kg/m  , BMI Body mass index is 32.43 kg/m. GENERAL:  Well appearing NECK:  No jugular venous distention, waveform within normal limits, carotid upstroke brisk and symmetric, no bruits, no thyromegaly LUNGS:  Clear to auscultation bilaterally CHEST:  Unremarkable HEART:  PMI not displaced or sustained,S1 and S2 within normal limits, no S3, no clicks, no rubs, no murmurs, irregular  ABD:  Flat, positive bowel sounds normal in frequency in pitch, no bruits, no rebound, no guarding, no midline pulsatile mass, no hepatomegaly, no splenomegaly EXT:  2 plus pulses throughout, no edema, no cyanosis no clubbing   EKG:  EKG is not ordered today.   Recent Labs: 08/17/2018: B Natriuretic Peptide 402.0; Magnesium 2.1 10/22/2018: ALT 26; TSH 9.710 11/08/2018: BUN 9; Creatinine, Ser 0.85; Hemoglobin 13.7; Platelets 265; Potassium 3.9; Sodium 141    Lipid Panel    Component Value Date/Time   CHOL 163 10/22/2018 0904   CHOL 100 05/07/2012 0957   TRIG 110 10/22/2018 0904   TRIG 148 06/21/2014 0932   TRIG 150 (H) 05/07/2012 0957   HDL 48 10/22/2018 0904   HDL 56 06/21/2014 0932   HDL 40 05/07/2012 0957   CHOLHDL 3.4 10/22/2018 0904   LDLCALC 95 10/22/2018 0904   LDLCALC 49 12/02/2013 0923   LDLCALC 30 05/07/2012 0957      Wt Readings from Last 3 Encounters:  12/01/18 186 lb (84.4 kg)  11/08/18 180 lb (81.6 kg)  10/22/18 186 lb (84.4 kg)      Other studies Reviewed: Additional studies/ records that were reviewed today include: ED records. Review of the above records demonstrates:  Please see elsewhere in the note.     ASSESSMENT AND PLAN:  Atrial fibrillation:  The patient tolerates this rhythm.  No change in therapy is indicated.  She will continue with meds as listed.  Ms. JAMILE REKOWSKI has a CHA2DS2 - VASc score of 5.    Hypertension:   The blood pressure is at target. No change in medications is indicated. We will continue with therapeutic lifestyle changes (TLC).   Chronic constipation: This is improved.  No change in therapy.  She is tolerating the Cardizem.   Chest pain: Patient has some atypical chest pain.  This is resolved.  I did review the emergency room records.  No change in therapy or further work-up.   Current medicines are reviewed at length with the patient today.  The patient does not have concerns regarding medicines.  The following changes have been made:  no change  Labs/ tests ordered today include: None No orders of the defined types were placed in this encounter.    Disposition:   FU with me in one year.     Signed, Minus Breeding, MD  12/01/2018 3:03 PM    [facility]

## 2018-12-01 ENCOUNTER — Ambulatory Visit: Payer: Medicare HMO | Admitting: Cardiology

## 2018-12-01 ENCOUNTER — Ambulatory Visit (INDEPENDENT_AMBULATORY_CARE_PROVIDER_SITE_OTHER): Payer: Medicare HMO

## 2018-12-01 ENCOUNTER — Encounter: Payer: Self-pay | Admitting: Cardiology

## 2018-12-01 ENCOUNTER — Other Ambulatory Visit: Payer: Self-pay

## 2018-12-01 VITALS — BP 138/70 | HR 84 | Ht 63.5 in | Wt 186.0 lb

## 2018-12-01 DIAGNOSIS — Z23 Encounter for immunization: Secondary | ICD-10-CM

## 2018-12-01 DIAGNOSIS — I5032 Chronic diastolic (congestive) heart failure: Secondary | ICD-10-CM

## 2018-12-01 DIAGNOSIS — I4891 Unspecified atrial fibrillation: Secondary | ICD-10-CM

## 2018-12-01 MED ORDER — FUROSEMIDE 20 MG PO TABS: 10.0000 mg | ORAL_TABLET | ORAL | 3 refills | Status: DC

## 2018-12-01 NOTE — Patient Instructions (Signed)
Medication Instructions:  Please stop your Hydrochlorothiazide. Start Furosemide 10 mg three times weekly as needed for swelling or shortness of breath. Continue all other medications as listed.  If you need a refill on your cardiac medications before your next appointment, please call your pharmacy.   Follow-Up: Follow up in 1 year with Dr. Percival Spanish.  You will receive a letter in the mail 2 months before you are due.  Please call us when you receive this letter to schedule your follow up appointment.  Thank you for choosing [facility]!!

## 2018-12-09 DIAGNOSIS — K8020 Calculus of gallbladder without cholecystitis without obstruction: Secondary | ICD-10-CM | POA: Diagnosis not present

## 2018-12-09 DIAGNOSIS — M940 Chondrocostal junction syndrome [Tietze]: Secondary | ICD-10-CM | POA: Diagnosis not present

## 2018-12-23 ENCOUNTER — Encounter: Payer: Self-pay | Admitting: Gastroenterology

## 2018-12-29 ENCOUNTER — Other Ambulatory Visit: Payer: Medicare HMO

## 2018-12-29 ENCOUNTER — Encounter: Payer: Self-pay | Admitting: Gastroenterology

## 2018-12-29 ENCOUNTER — Ambulatory Visit (INDEPENDENT_AMBULATORY_CARE_PROVIDER_SITE_OTHER): Payer: Medicare HMO | Admitting: Gastroenterology

## 2018-12-29 ENCOUNTER — Other Ambulatory Visit: Payer: Self-pay

## 2018-12-29 VITALS — BP 123/72 | HR 110 | Temp 97.0°F | Ht 63.0 in | Wt 178.8 lb

## 2018-12-29 DIAGNOSIS — R634 Abnormal weight loss: Secondary | ICD-10-CM

## 2018-12-29 DIAGNOSIS — R1012 Left upper quadrant pain: Secondary | ICD-10-CM

## 2018-12-29 DIAGNOSIS — R140 Abdominal distension (gaseous): Secondary | ICD-10-CM | POA: Diagnosis not present

## 2018-12-29 DIAGNOSIS — K5909 Other constipation: Secondary | ICD-10-CM

## 2018-12-29 NOTE — Progress Notes (Addendum)
[facility] Consult Note:  History: Veronica Paul 12/29/2018  Referring provider: Chevis Pretty, FNP  Reason for consult/chief complaint: Abdominal Pain, Gas, and Bloated   Subjective  HPI:  Veronica Paul was referred by Dr. Rosendo Gros of surgery after a recent visit there.  She is with her daughter today, and they report she has had left upper quadrant pain for a few months, which seemed to start just after she was diagnosed with A. fib.  Every day she has  left upper quadrant pain that is either bloated or crampy or sharp, sometimes radiating up into the chest, which is why she went to the emergency department.  It is very difficult to characterize, as she points to various locations primarily in the left hemiabdomen, and says it just "changes".  She was having chronic constipation, but that is apparently relieved with regular use of MiraLAX and stool softener.  She feels bloated, this seems to be affecting her appetite.  She has lost at least 10 pounds in the last several months which they attribute to the symptoms.  Despite that, she has apparently had an increase of abdominal girth.  Seen by Dr. Ralene Ok at [facility] surgery for left upper quadrant pain and ultrasound revealing gallstones.  Normal [facility] HIDA scan, and Dr. Rosendo Gros felt the gallstones were asymptomatic.  His clinical suspicion for the left upper quadrant pain was costochondritis, and he recommended ibuprofen and then referred the patient to [place] for consideration of endoscopy.  She had a reported history of H. pylori treatment.  She was in the [facility] ED on 11/08/2018 with intermittent left-sided chest pain going on since July.  On that occasion, the pain was reportedly relieved after having taken a Klonopin at home.  Cardiac testing negative for ischemia that day.  Saw primary care on 09/23/2018 for abdominal bloating and constipation.  KUB done, advised to take milk of magnesia with  prune juice and stool softener. H. pylori IgG antibody slightly over normal range at 0.9.  She was treated with 7 days of amoxicillin, levofloxacin and lansoprazole.  Colonoscopy by Dr. Deatra Ina January 2015 for history of colon polyps.  Good prep, complete exam to cecum.  No polyps.  Pandiverticulosis, notably severe in the sigmoid.  ROS:  Review of Systems  Constitutional: Negative for appetite change and unexpected weight change.  HENT: Negative for mouth sores and voice change.   Eyes: Negative for pain and redness.  Respiratory: Negative for cough and shortness of breath.   Cardiovascular: Positive for palpitations. Negative for chest pain.  Genitourinary: Negative for dysuria and hematuria.  Musculoskeletal: Negative for arthralgias and myalgias.  Skin: Negative for pallor and rash.  Neurological: Negative for weakness and headaches.  Hematological: Negative for adenopathy.  Psychiatric/Behavioral:       Anxiety     Past Medical History: Past Medical History:  Diagnosis Date  . Anxiety   . Arthritis   . Atrial fibrillation ([place])   . Cholelithiasis   . Depression   . Diabetes mellitus   . Dyslipidemia   . HTN (hypertension)   . Hx of long-term (current) use of anticoagulants   . Hyperlipidemia   . Hypertension   . Hypothyroidism   . Lung cancer ([place])   . Obesity   . Osteopenia   . Vitamin D deficiency      Past Surgical History: Past Surgical History:  Procedure Laterality Date  . CATARACT EXTRACTION    . COLONOSCOPY    .  LUNG REMOVAL, PARTIAL  1998   Right  . THYROIDECTOMY       Family History: Family History  Problem Relation Age of Onset  . Coronary artery disease Brother 9  . Hypertension Brother   . Heart disease Brother   . Hypertension Mother   . Hypertension Father   . Heart disease Father   . Hypertension Sister   . Heart disease Sister   . Arthritis Daughter   . COPD Daughter   . Fibromyalgia Daughter   . Ulcerative colitis Son   .  Stroke Maternal Grandmother   . Cancer Brother   . Hypertension Sister   . Colon cancer Neg Hx   . Food intolerance Neg Hx     Social History: Social History   Socioeconomic History  . Marital status: Widowed    Spouse name: Not on file  . Number of children: 4  . Years of education: 29  . Highest education level: 12th grade  Occupational History  . Occupation: Retired    Comment: Interior and spatial designer  . Financial resource strain: Not hard at all  . Food insecurity    Worry: Never true    Inability: Never true  . Transportation needs    Medical: No    Non-medical: No  Tobacco Use  . Smoking status: Never Smoker  . Smokeless tobacco: Never Used  Substance and Sexual Activity  . Alcohol use: No  . Drug use: No  . Sexual activity: Not Currently  Lifestyle  . Physical activity    Days per week: 0 days    Minutes per session: 0 min  . Stress: Rather much  Relationships  . Social connections    Talks on phone: More than three times a week    Gets together: More than three times a week    Attends religious service: More than 4 times per year    Active member of club or organization: Yes    Attends meetings of clubs or organizations: More than 4 times per year    Relationship status: Widowed  Other Topics Concern  . Not on file  Social History Narrative   N/A    Allergies: Allergies  Allergen Reactions  . Ace Inhibitors Cough  . Feldene [Piroxicam]     REACTION: RASH TO HANDS  . Meloxicam Nausea And Vomiting  . Prevnar [Pneumococcal 13-Val Conj Vacc]   . Statins Other (See Comments)    myalgia  . Sulfa Antibiotics     Rash   . Zithromax [Azithromycin Dihydrate] Itching    Outpatient Meds: Current Outpatient Medications  Medication Sig Dispense Refill  . apixaban (ELIQUIS) 5 MG TABS tablet Take 5 mg by mouth 2 (two) times daily.    . Blood Glucose Monitoring Suppl (ONETOUCH VERIO) w/Device KIT Check blood sugars daily Dx E11.9 1 kit 0  .  clonazePAM (KLONOPIN) 0.5 MG tablet Take 1 tablet (0.5 mg total) by mouth 2 (two) times daily as needed. 60 tablet 3  . diltiazem (CARDIZEM CD) 360 MG 24 hr capsule Take 1 capsule (360 mg total) by mouth daily. 90 capsule 3  . fish oil-omega-3 fatty acids 1000 MG capsule Take 2 g by mouth daily.      . furosemide (LASIX) 20 MG tablet Take 0.5 tablets (10 mg total) by mouth 3 (three) times a week. Take 1/2 tablet as needed 3 times weekly 15 tablet 3  . glucose blood (ONETOUCH VERIO) test strip Check blood sugars daily Dx E11.9  100 each 3  . Lancets (ONETOUCH ULTRASOFT) lancets Check blood sugars daily Dx E11.9 100 each 3  . levothyroxine (SYNTHROID) 75 MCG tablet Take 1 tablet (75 mcg total) by mouth daily. 90 tablet 3  . metFORMIN (GLUCOPHAGE) 500 MG tablet Take 1 tablet (500 mg total) by mouth daily. 90 tablet 1  . metoprolol (TOPROL-XL) 200 MG 24 hr tablet TAKE 1 AND 1/2 TABLET DAILY 135 tablet 1  . nitroGLYCERIN (NITROSTAT) 0.4 MG SL tablet PLACE 1 TABLET (0.4 MG TOTAL) UNDER THE TONGUE EVERY 5 (FIVE) MINUTES AS NEEDED FOR CHEST PAIN. 25 tablet 1  . polyethylene glycol (MIRALAX / GLYCOLAX) 17 g packet Take 17 g by mouth daily as needed.    . pravastatin (PRAVACHOL) 80 MG tablet Take 1 tablet (80 mg total) by mouth every other day. 90 tablet 1   No current facility-administered medications for this visit.       ___________________________________________________________________ Objective   Exam:  BP 123/72   Pulse (!) 110   Temp (!) 97 F (36.1 C)   Ht '5\' 3"'  (1.6 m)   Wt 178 lb 12.8 oz (81.1 kg)   LMP 05/11/1992   SpO2 98%   BMI 31.67 kg/m    General: Pleasant, no acute distress, no muscle wasting  Eyes: sclera anicteric, no redness  ENT: oral mucosa moist without lesions, no cervical or supraclavicular lymphadenopathy  CV: Irregular without murmur, S1/S2, no JVD, mild peripheral edema  Resp: clear to auscultation bilaterally, normal RR and effort noted  GI: soft, +  tenderness over the anterior and lateral left lower rib cage, as well as various locations in the left hemiabdomen., with active bowel sounds. No guarding or palpable organomegaly noted.  Skin; warm and dry, no rash or jaundice noted  Neuro: awake, alert and oriented x 3. Normal gross motor function and fluent speech No bulging flanks fluid wave or distention Labs:  TSH low at 0.313 in June. TSH elevated at 9.7 in September, though with normal T4 and FTI.  Radiologic Studies:  Normal KUB on 10/22/2018 and 09/23/2018  Assessment: Encounter Diagnoses  Name Primary?  . LUQ pain Yes  . Abnormal loss of weight   . Chronic constipation   . Abdominal bloating     This pain is difficult to characterize, but I agree it does not seem like biliary colic.  She has tenderness over the chest wall but also the left abdominal wall as well.  It is not clearly related to meals or position or relieved after improvement in the constipation.  I am concerned that there is been weight loss in the setting of increased abdominal girth, which we have to assume is related to this pain in someway. She had been told that she would probably need an upper endoscopy after seeing [place], but I am not yet sure that will discover the source of this pain. Plan:  CT scan abdomen and pelvis with oral and IV contrast.  Renal function recently normal, will check again due to age before giving IV contrast.  After that, we can then decide what, if any, endoscopic evaluation is warranted, specially given age and use of chronic anticoagulation.  Thank you for the courtesy of this consult.  Please call me with any questions or concerns.  Nelida Meuse III  CC: Referring provider noted above

## 2018-12-29 NOTE — Patient Instructions (Signed)
If you are age 81 or older, your body mass index should be between 23-30. Your Body mass index is 31.67 kg/m. If this is out of the aforementioned range listed, please consider follow up with your Primary Care Provider.  If you are age 97 or younger, your body mass index should be between 19-25. Your Body mass index is 31.67 kg/m. If this is out of the aformentioned range listed, please consider follow up with your Primary Care Provider.   Your provider has requested that you go to the basement level for lab work before leaving today. Press "B" on the elevator. The lab is located at the first door on the left as you exit the elevator.  You have been scheduled for a CT scan of the abdomen and pelvis at [facility] ([address]---this is in the same building as [employer]).   You are scheduled on 01-04-2019 at 1230pm. You should arrive 15 minutes prior to your appointment time for registration. Please follow the written instructions below on the day of your exam:  WARNING: IF YOU ARE ALLERGIC TO IODINE/X-RAY DYE, PLEASE NOTIFY RADIOLOGY IMMEDIATELY AT [id number]! YOU WILL BE GIVEN A 13 HOUR PREMEDICATION PREP.  1) Do not eat or drink anything after 830am (4 hours prior to your test) 2) You have been given 2 bottles of oral contrast to drink. The solution may taste better if refrigerated, but do NOT add ice or any other liquid to this solution. Shake well before drinking.    Drink 1 bottle of contrast @ 1030am (2 hours prior to your exam)  Drink 1 bottle of contrast @ 1130am (1 hour prior to your exam)  You may take any medications as prescribed with a small amount of water, if necessary. If you take any of the following medications: METFORMIN, GLUCOPHAGE, GLUCOVANCE, AVANDAMET, RIOMET, FORTAMET, [place] MET, JANUMET, GLUMETZA or METAGLIP, you MAY be asked to HOLD this medication 48 hours AFTER the exam.  The purpose of you drinking the oral contrast is to aid in  the visualization of your intestinal tract. The contrast solution may cause some diarrhea. Depending on your individual set of symptoms, you may also receive an intravenous injection of x-ray contrast/dye. Plan on being at [facility] for 30 minutes or longer, depending on the type of exam you are having performed.  This test typically takes 30-45 minutes to complete.  If you have any questions regarding your exam or if you need to reschedule, you may call the CT department at [phone] between the hours of 8:00 am and 5:00 pm, Monday-Friday.  ________________________________________________________________________  It was a pleasure to see you today!  Dr. Loletha Carrow

## 2018-12-30 DIAGNOSIS — R69 Illness, unspecified: Secondary | ICD-10-CM | POA: Diagnosis not present

## 2018-12-30 DIAGNOSIS — E039 Hypothyroidism, unspecified: Secondary | ICD-10-CM | POA: Diagnosis not present

## 2018-12-30 DIAGNOSIS — E785 Hyperlipidemia, unspecified: Secondary | ICD-10-CM | POA: Diagnosis not present

## 2018-12-30 DIAGNOSIS — I4891 Unspecified atrial fibrillation: Secondary | ICD-10-CM | POA: Diagnosis not present

## 2018-12-30 DIAGNOSIS — E669 Obesity, unspecified: Secondary | ICD-10-CM | POA: Diagnosis not present

## 2018-12-30 DIAGNOSIS — I209 Angina pectoris, unspecified: Secondary | ICD-10-CM | POA: Diagnosis not present

## 2018-12-30 DIAGNOSIS — I10 Essential (primary) hypertension: Secondary | ICD-10-CM | POA: Diagnosis not present

## 2018-12-30 DIAGNOSIS — E119 Type 2 diabetes mellitus without complications: Secondary | ICD-10-CM | POA: Diagnosis not present

## 2018-12-30 DIAGNOSIS — D6869 Other thrombophilia: Secondary | ICD-10-CM | POA: Diagnosis not present

## 2018-12-30 LAB — BUN/CREATININE RATIO
BUN: 7 mg/dL (ref 7–25)
Creat: 0.85 mg/dL (ref 0.60–0.88)
GFR, Est African American: 74 mL/min/{1.73_m2} (ref >=60)
GFR, Est Non African American: 64 mL/min/{1.73_m2} (ref >=60)

## 2019-01-04 ENCOUNTER — Ambulatory Visit (HOSPITAL_COMMUNITY): Payer: Medicare HMO

## 2019-01-06 ENCOUNTER — Ambulatory Visit (HOSPITAL_COMMUNITY): Payer: Medicare HMO

## 2019-01-07 ENCOUNTER — Other Ambulatory Visit: Payer: Self-pay

## 2019-01-07 ENCOUNTER — Ambulatory Visit (HOSPITAL_COMMUNITY)
Admission: RE | Admit: 2019-01-07 | Discharge: 2019-01-07 | Disposition: A | Discharge status: Home / Self Care | Payer: Medicare HMO | Source: Ambulatory Visit | Attending: Gastroenterology | Admitting: Gastroenterology

## 2019-01-07 DIAGNOSIS — R634 Abnormal weight loss: Secondary | ICD-10-CM

## 2019-01-07 DIAGNOSIS — R140 Abdominal distension (gaseous): Secondary | ICD-10-CM | POA: Diagnosis present

## 2019-01-07 DIAGNOSIS — R1012 Left upper quadrant pain: Secondary | ICD-10-CM | POA: Insufficient documentation

## 2019-01-07 DIAGNOSIS — K5909 Other constipation: Secondary | ICD-10-CM | POA: Diagnosis present

## 2019-01-07 DIAGNOSIS — R109 Unspecified abdominal pain: Secondary | ICD-10-CM | POA: Diagnosis not present

## 2019-01-07 MED ORDER — SODIUM CHLORIDE (PF) 0.9 % IJ SOLN
INTRAMUSCULAR | Status: AC
  Filled 2019-01-07: qty 50

## 2019-01-07 MED ORDER — IOHEXOL 300 MG/ML  SOLN
100.0000 mL | Freq: Once | INTRAMUSCULAR | Status: AC | PRN
  Administered 2019-01-07: 100 mL via INTRAVENOUS

## 2019-01-11 ENCOUNTER — Telehealth: Payer: Self-pay | Admitting: Gastroenterology

## 2019-01-11 NOTE — Telephone Encounter (Signed)
Patient daughter Neoma Laming inquired about CT results. Please advise.

## 2019-01-11 NOTE — Telephone Encounter (Signed)
Pt's daughter Neoma Laming calling about imaging results. She is requesting a call to her with results instead of pt, she states that pt get confused and will not remember results.

## 2019-01-12 ENCOUNTER — Ambulatory Visit (HOSPITAL_COMMUNITY): Payer: Medicare HMO

## 2019-01-12 NOTE — Telephone Encounter (Signed)
[facility] Pre-operative Risk Assessment     Request for surgical clearance:     Endoscopy Procedure  What type of surgery is being performed?     Upper endoscopy  When is this surgery scheduled?     Currently not scheduled, looking to schedule mid to late December  What type of clearance is required ?   Pharmacy  Are there any medications that need to be held prior to surgery and how long? Eliquis, 2 days  Practice name and name of physician performing surgery?      [facility]  What is your office phone and fax number?      Phone- [phone]  [phone]  Anesthesia type (None, local, MAC, general) ?       MAC

## 2019-01-12 NOTE — Telephone Encounter (Signed)
Brittani,   I spoke with Neoma Laming about the CT results.  No cause for this left lower chest/left upper quadrant abdominal pain was seen.  I am still not certain it is digestive, as noted and discussed at recent office visit.  It could be costochondritis, but the pain sometimes does radiate down the left hemiabdomen.  While I am still not sure it is upper digestive in nature, I have offered an upper endoscopy to evaluate.  Neoma Laming believes that her mother would be agreeable to that, especially since these pain episodes have apparently been occurring more often and severely since I last saw her.  They still are nonexertional, can happen almost anytime of day, and does not consistently happen with clear trigger such as eating, time of day or position or movement.  Please call Neoma Laming after the holiday, set up an upper endoscopy with me in the [place] (LVEF normal on last echocardiogram, so okay for [place]).  She would need to be off Eliquis 2 days prior and please clear this with her cardiologist.  I expect they will be agreeable.  I have copied this to her primary care provider so they might consider some alternate medicine in case this is costochondritis, since apparently NSAIDs did not help.   Ms. Hassell Done,  Please see above.   Wilfrid Lund, MD    [facility]

## 2019-01-17 NOTE — Telephone Encounter (Signed)
Patient with diagnosis of Atrial Fibrillation on Eliquis for anticoagulation.    Procedure: Upper Endoscopy Date of procedure: TBD - possibly mid to late December  CHADS2-VASc score of  5 (HTN, AGEx2, DM2, female)  CrCl 66 ml/min Platelet count 265  Per office protocol, patient can hold Eliquis for 2 days prior to procedure.    Patient should restart Eliquis on the evening of procedure or day after, at discretion of procedure MD

## 2019-01-17 NOTE — Telephone Encounter (Signed)
Hi. Dr. Percival Spanish,  Patient has upcoming EGD planned for work-up of left upper quadrant pain/chest pain. She was seen in the ED on 11/08/2018 for this atypical chest pain. EKG unremarkable and high-sensitivity troponin negative x2. You saw her on 12/01/2018 for follow-up at which time she was complaining of more abdominal pain rather than chest pain. Since then she has had continued left upper quadrant pain that radiates up to her chest at time. She was seen by GI and had abdominal/pelvic CT on 01/07/2019 which showed no specific cause for her pain. CT did show coronary artery calcifications in LAD and circumflex distribution. GI is not convinced that her pain is digestive in nature but are planning for EGD.  I know EGD is a low risk procedure but given ongoing symptoms I just wanted to make sure you didn't feel like she needed any additional ischemic work-up prior to EGD or whether that is something to consider depending on what EGD shows. Please route response back to pre-op pool.   Thank you! Callie

## 2019-01-18 ENCOUNTER — Telehealth: Payer: Self-pay | Admitting: Gastroenterology

## 2019-01-18 DIAGNOSIS — R1012 Left upper quadrant pain: Secondary | ICD-10-CM

## 2019-01-18 DIAGNOSIS — K5909 Other constipation: Secondary | ICD-10-CM

## 2019-01-18 DIAGNOSIS — Z1159 Encounter for screening for other viral diseases: Secondary | ICD-10-CM

## 2019-01-18 DIAGNOSIS — R140 Abdominal distension (gaseous): Secondary | ICD-10-CM

## 2019-01-18 DIAGNOSIS — R634 Abnormal weight loss: Secondary | ICD-10-CM

## 2019-01-18 NOTE — Telephone Encounter (Signed)
OK to proceed with GI evaluation.

## 2019-01-18 NOTE — Telephone Encounter (Signed)
This RN spoke with [name], daughter of patient to schedule her mother for her EGD. The patient has been scheduled for the following, [name] is aware:   01/21/19 at 8 am COVID Screening, patient will come by the office to sign consent forms  01/26/2019 at 4:30 pm EGD in [place] (aware to hold Eliquis 2 days prior)  All appointments scheduled with daughter. Daughter requested earlier COVID screening for her mother (transportation/work availability). Spoke with the daughter and told the daughter the patient has to arrive 1 hour prior to procedure start time and to be NPO for 6 hours prior. Verbalized understanding. Order in [place] for [place] screening, ambulatory referral placed in Epic.

## 2019-01-18 NOTE — Telephone Encounter (Signed)
   Primary Cardiologist: Minus Breeding, MD  Chart reviewed as part of pre-operative protocol coverage. Given past medical history and time since last visit, based on ACC/AHA guidelines, [place] would be at acceptable risk for the planned procedure without further cardiovascular testing.   According to our pharmacy protocol: Patient with diagnosis of Atrial Fibrillation on Eliquis for anticoagulation.    CHADS2-VASc score of  5 (HTN, AGEx2, DM2, female) CrCl 66 ml/min Platelet count 265  Per office protocol, patient can hold Eliquis for 2 days prior to procedure.   Patient should restart Eliquis on the evening of procedure or day after, at discretion of procedure MD  I will route this recommendation to the requesting party via [facility] fax function and remove from pre-op pool.  Please call with questions.  Daune Perch, NP 01/18/2019, 12:54 PM

## 2019-01-18 NOTE — Telephone Encounter (Signed)
This RN spoke with Veronica Paul, daughter of patient to schedule her mother for her EGD. The patient has been scheduled for the following, [name] is aware:   01/21/19 at 8 am COVID Screening, patient will come by the office to sign consent forms  01/26/2019 at 4:30 pm EGD in [place] (aware to hold Eliquis 2 days prior)  All appointments scheduled with daughter. Daughter requested earlier COVID screening for her mother (transportation/work availability). Spoke with the daughter and told the daughter the patient has to arrive 1 hour prior to procedure start time and to be NPO for 6 hours prior. Verbalized understanding. Order in [place] for [place] screening, ambulatory referral placed in Epic.

## 2019-01-21 ENCOUNTER — Ambulatory Visit (INDEPENDENT_AMBULATORY_CARE_PROVIDER_SITE_OTHER): Payer: Medicare HMO

## 2019-01-21 DIAGNOSIS — Z1159 Encounter for screening for other viral diseases: Secondary | ICD-10-CM | POA: Diagnosis not present

## 2019-01-21 LAB — SARS CORONAVIRUS 2 (TAT 6-24 HRS): SARS Coronavirus 2: NEGATIVE

## 2019-01-25 ENCOUNTER — Encounter: Payer: Self-pay | Admitting: Gastroenterology

## 2019-01-26 ENCOUNTER — Encounter: Payer: Self-pay | Admitting: Gastroenterology

## 2019-01-26 ENCOUNTER — Other Ambulatory Visit: Payer: Self-pay

## 2019-01-26 ENCOUNTER — Ambulatory Visit (AMBULATORY_SURGERY_CENTER): Payer: Medicare HMO | Admitting: Gastroenterology

## 2019-01-26 VITALS — BP 156/92 | HR 80 | Temp 97.6°F | Resp 26 | Ht 63.0 in | Wt 178.0 lb

## 2019-01-26 DIAGNOSIS — R1012 Left upper quadrant pain: Secondary | ICD-10-CM | POA: Diagnosis not present

## 2019-01-26 DIAGNOSIS — R109 Unspecified abdominal pain: Secondary | ICD-10-CM | POA: Diagnosis not present

## 2019-01-26 MED ORDER — SODIUM CHLORIDE 0.9 % IV SOLN: 500.0000 mL | Freq: Once | INTRAVENOUS | Status: DC

## 2019-01-26 NOTE — Patient Instructions (Signed)
YOU MAY RESUME YOUR PREVIOUS DIET AND MEDICATION SCHEDULE.  [place] YOU FOR ALLOWING [place] TO CARE FOR YOU TODAY!!!  YOU HAD AN ENDOSCOPIC PROCEDURE TODAY AT [facility]:   Refer to the procedure report that was given to you for any specific questions about what was found during the examination.  If the procedure report does not answer your questions, please call your gastroenterologist to clarify.  If you requested that your care partner not be given the details of your procedure findings, then the procedure report has been included in a sealed envelope for you to review at your convenience later.  Please Note:  You might notice some irritation and congestion in your nose or some drainage.  This is from the oxygen used during your procedure.  There is no need for concern and it should clear up in a day or so.  SYMPTOMS TO REPORT IMMEDIATELY:   Following upper endoscopy (EGD)  Vomiting of blood or coffee ground material  New chest pain or pain under the shoulder blades  Painful or persistently difficult swallowing  New shortness of breath  Fever of 100F or higher  Black, tarry-looking stools  For urgent or emergent issues, a gastroenterologist can be reached at any hour by calling [phone].   DIET:  We do recommend a small meal at first, but then you may proceed to your regular diet.  Drink plenty of fluids but you should avoid alcoholic beverages for 24 hours.  ACTIVITY:  You should plan to take it easy for the rest of today and you should NOT DRIVE or use heavy machinery until tomorrow (because of the sedation medicines used during the test).    FOLLOW UP: Our staff will call the number listed on your records 48-72 hours following your procedure to check on you and address any questions or concerns that you may have regarding the information given to you following your procedure. If we do not reach you, we will leave a message.  We will attempt to reach you two times.   During this call, we will ask if you have developed any symptoms of COVID 19. If you develop any symptoms (ie: fever, flu-like symptoms, shortness of breath, cough etc.) before then, please call [phone].  If you test positive for Covid 19 in the 2 weeks post procedure, please call and report this information to [place].    If any biopsies were taken you will be contacted by phone or by letter within the next 1-3 weeks.  Please call us at [phone] if you have not heard about the biopsies in 3 weeks.    SIGNATURES/CONFIDENTIALITY: You and/or your care partner have signed paperwork which will be entered into your electronic medical record.  These signatures attest to the fact that that the information above on your After Visit Summary has been reviewed and is understood.  Full responsibility of the confidentiality of this discharge information lies with you and/or your care-partner.

## 2019-01-26 NOTE — Progress Notes (Signed)
VS-DT Temp-JR

## 2019-01-26 NOTE — Op Note (Addendum)
[facility] Patient Name: Veronica Paul Procedure Date: 01/26/2019 4:26 PM MRN: [id number] Endoscopist: Mallie Mussel L. Loletha Carrow , MD Age: 83 Referring MD:  Date of Birth: December 22, 1935 Gender: Female Account #: [id number] Procedure:                Upper GI endoscopy Indications:              Abdominal pain in the left upper quadrant/left                            lower chest. (gallstones seen on [place] and CT scan -                            not felt to be the cause of the pain, given it's                            character and location). Began with A fib Dx 6                            months ago - sometimes relieved with clonazepam Medicines:                Monitored Anesthesia Care Procedure:                Pre-Anesthesia Assessment:                           - Prior to the procedure, a History and Physical                            was performed, and patient medications and                            allergies were reviewed. The patient's tolerance of                            previous anesthesia was also reviewed. The risks                            and benefits of the procedure and the sedation                            options and risks were discussed with the patient.                            All questions were answered, and informed consent                            was obtained. Prior Anticoagulants: The patient has                            taken Eliquis (apixaban), last dose was 2 days                            prior to procedure. ASA Grade Assessment: III -  A                            patient with severe systemic disease. After                            reviewing the risks and benefits, the patient was                            deemed in satisfactory condition to undergo the                            procedure.                           After obtaining informed consent, the endoscope was                            passed under direct vision. Throughout the                         procedure, the patient's blood pressure, pulse, and                            oxygen saturations were monitored continuously. The                            Endoscope was introduced through the mouth, and                            advanced to the second part of duodenum. The upper                            GI endoscopy was accomplished without difficulty.                            The patient tolerated the procedure well. Scope In: Scope Out: Findings:                 The esophagus was normal.                           The stomach was normal.                           The cardia and gastric fundus were normal on                            retroflexion.                           The examined duodenum was normal. Complications:            No immediate complications. Estimated Blood Loss:     Estimated blood loss: none. Impression:               - Normal esophagus.                           -  Normal stomach.                           - Normal examined duodenum.                           - No specimens collected. Recommendation:           - Patient has a contact number available for                            emergencies. The signs and symptoms of potential                            delayed complications were discussed with the                            patient. Return to normal activities tomorrow.                            Written discharge instructions were provided to the                            patient.                           - Resume previous diet.                           - Continue present medications. Resume Eliquis                            today.                           - Return to primary care physician.Pain is not                            exertional or associated with palpitations to                            suggest rapid A fib episodes. Henry L. Loletha Carrow, MD 01/26/2019 4:45:55 PM This report has been signed electronically.

## 2019-01-26 NOTE — Progress Notes (Signed)
Pt tolerated well. VSS. Awake and to recovery. 

## 2019-01-27 ENCOUNTER — Ambulatory Visit (INDEPENDENT_AMBULATORY_CARE_PROVIDER_SITE_OTHER): Payer: Medicare HMO | Admitting: Nurse Practitioner

## 2019-01-27 ENCOUNTER — Encounter: Payer: Self-pay | Admitting: Nurse Practitioner

## 2019-01-27 VITALS — BP 136/88 | HR 84 | Temp 97.1°F | Resp 20 | Ht 63.0 in | Wt 179.0 lb

## 2019-01-27 DIAGNOSIS — E119 Type 2 diabetes mellitus without complications: Secondary | ICD-10-CM | POA: Diagnosis not present

## 2019-01-27 DIAGNOSIS — I4891 Unspecified atrial fibrillation: Secondary | ICD-10-CM | POA: Diagnosis not present

## 2019-01-27 DIAGNOSIS — E876 Hypokalemia: Secondary | ICD-10-CM

## 2019-01-27 DIAGNOSIS — E782 Mixed hyperlipidemia: Secondary | ICD-10-CM

## 2019-01-27 DIAGNOSIS — Z6834 Body mass index (BMI) 34.0-34.9, adult: Secondary | ICD-10-CM

## 2019-01-27 DIAGNOSIS — R609 Edema, unspecified: Secondary | ICD-10-CM | POA: Diagnosis not present

## 2019-01-27 DIAGNOSIS — E038 Other specified hypothyroidism: Secondary | ICD-10-CM | POA: Diagnosis not present

## 2019-01-27 DIAGNOSIS — I10 Essential (primary) hypertension: Secondary | ICD-10-CM

## 2019-01-27 DIAGNOSIS — R1012 Left upper quadrant pain: Secondary | ICD-10-CM

## 2019-01-27 DIAGNOSIS — R69 Illness, unspecified: Secondary | ICD-10-CM | POA: Diagnosis not present

## 2019-01-27 DIAGNOSIS — F3341 Major depressive disorder, recurrent, in partial remission: Secondary | ICD-10-CM

## 2019-01-27 DIAGNOSIS — F411 Generalized anxiety disorder: Secondary | ICD-10-CM

## 2019-01-27 LAB — BAYER DCA HB A1C WAIVED: HB A1C (BAYER DCA - WAIVED): 6.1 % (ref <7.0)

## 2019-01-27 MED ORDER — LEVOTHYROXINE SODIUM 75 MCG PO TABS: 75.0000 ug | ORAL_TABLET | Freq: Every day | ORAL | 1 refills | Status: DC

## 2019-01-27 MED ORDER — FUROSEMIDE 20 MG PO TABS: 10.0000 mg | ORAL_TABLET | Freq: Every day | ORAL | 1 refills | Status: DC | PRN

## 2019-01-27 MED ORDER — METFORMIN HCL 500 MG PO TABS: 500.0000 mg | ORAL_TABLET | Freq: Every day | ORAL | 1 refills | Status: DC

## 2019-01-27 MED ORDER — CLONAZEPAM 0.5 MG PO TABS: 0.5000 mg | ORAL_TABLET | Freq: Two times a day (BID) | ORAL | 3 refills | Status: DC | PRN

## 2019-01-27 MED ORDER — PRAVASTATIN SODIUM 80 MG PO TABS: 80.0000 mg | ORAL_TABLET | ORAL | 1 refills | Status: DC

## 2019-01-27 MED ORDER — APIXABAN 5 MG PO TABS: 5.0000 mg | ORAL_TABLET | Freq: Two times a day (BID) | ORAL | 5 refills | Status: DC

## 2019-01-27 MED ORDER — METOPROLOL SUCCINATE ER 200 MG PO TB24: ORAL_TABLET | ORAL | 1 refills | Status: DC

## 2019-01-27 MED ORDER — DILTIAZEM HCL ER COATED BEADS 360 MG PO CP24: 360.0000 mg | ORAL_CAPSULE | Freq: Every day | ORAL | 3 refills | Status: DC

## 2019-01-27 NOTE — Progress Notes (Signed)
Subjective:    Patient ID: Veronica Paul, female    DOB: 06/23/1935, 83 y.o.   MRN: [id number]   Chief Complaint: Medical Management of Chronic Issues    HPI:  1. Essential hypertension No c/o chest pain,sob or headache. Does not check blood pressure at home. BP Readings from Last 3 Encounters:  01/26/19 (!) 156/92  12/29/18 123/72  12/01/18 138/70     2. Atrial fibrillation with RVR ([place]) Is on eliquis daily an dis doing ell. No bleeding  3. Hypokalemia No c/o lower ext cramping Lab Results  Component Value Date   K 3.9 11/08/2018    4. Mixed hyperlipidemia Tries to watch diet but is not able to do much exercise.  5. Other specified hypothyroidism No problems that patient is ware of.  6. Diabetes mellitus without complication (HCC) Fasting blood sugars have been running below 130 daily. Denies low blood sugar readings Lab Results  Component Value Date   HGBA1C 5.9 10/22/2018     7. Peripheral edema Has some peripheral edema daily. She props her legs up when she is sitting  8. Recurrent major depressive disorder, in partial remission ([place]) Is currently on no antidepressants Depression screen [facility] [place] 2/9 01/27/2019 10/22/2018 09/23/2018  Decreased Interest 0 0 0  Down, Depressed, Hopeless 0 0 0  PHQ - 2 Score 0 0 0  Altered sleeping - - -  Tired, decreased energy - - -  Change in appetite - - -  Feeling bad or failure about yourself  - - -  Trouble concentrating - - -  Moving slowly or fidgety/restless - - -  Suicidal thoughts - - -  PHQ-9 Score - - -  Difficult doing work/chores - - -  Some recent data might be hidden     9. GAD (generalized anxiety disorder) Is on klonopin 0.79m bid. She has been taking this for many years  10. BMI 34.0-34.9,adult No recent weight changes Wt Readings from Last 3 Encounters:  01/27/19 179 lb (81.2 kg)  01/26/19 178 lb (80.7 kg)  12/29/18 178 lb 12.8 oz (81.1 kg)       Outpatient Encounter Medications as of  01/27/2019  Medication Sig  . apixaban (ELIQUIS) 5 MG TABS tablet Take 5 mg by mouth 2 (two) times daily.  . Blood Glucose Monitoring Suppl (ONETOUCH VERIO) w/Device KIT Check blood sugars daily Dx E11.9  . clonazePAM (KLONOPIN) 0.5 MG tablet Take 1 tablet (0.5 mg total) by mouth 2 (two) times daily as needed.  . diltiazem (CARDIZEM CD) 360 MG 24 hr capsule Take 1 capsule (360 mg total) by mouth daily.  . fish oil-omega-3 fatty acids 1000 MG capsule Take 2 g by mouth daily.    . furosemide (LASIX) 20 MG tablet Take 0.5 tablets (10 mg total) by mouth 3 (three) times a week. Take 1/2 tablet as needed 3 times weekly  . glucose blood (ONETOUCH VERIO) test strip Check blood sugars daily Dx E11.9  . Lancets (ONETOUCH ULTRASOFT) lancets Check blood sugars daily Dx E11.9  . levothyroxine (SYNTHROID) 75 MCG tablet Take 1 tablet (75 mcg total) by mouth daily.  . metFORMIN (GLUCOPHAGE) 500 MG tablet Take 1 tablet (500 mg total) by mouth daily.  . metoprolol (TOPROL-XL) 200 MG 24 hr tablet TAKE 1 AND 1/2 TABLET DAILY  . nitroGLYCERIN (NITROSTAT) 0.4 MG SL tablet PLACE 1 TABLET (0.4 MG TOTAL) UNDER THE TONGUE EVERY 5 (FIVE) MINUTES AS NEEDED FOR CHEST PAIN.  .Marland Kitchenpolyethylene glycol (MIRALAX / GLYCOLAX)  17 g packet Take 17 g by mouth daily as needed.  . pravastatin (PRAVACHOL) 80 MG tablet Take 1 tablet (80 mg total) by mouth every other day.     Past Surgical History:  Procedure Laterality Date  . CATARACT EXTRACTION    . COLONOSCOPY    . LUNG REMOVAL, PARTIAL  1998   Right  . THYROIDECTOMY    . TUBAL LIGATION      Family History  Problem Relation Age of Onset  . Coronary artery disease Brother 75  . Hypertension Brother   . Heart disease Brother   . Hypertension Mother   . Hypertension Father   . Heart disease Father   . Hypertension Sister   . Heart disease Sister   . Arthritis Daughter   . COPD Daughter   . Fibromyalgia Daughter   . Ulcerative colitis Son   . Stroke Maternal  Grandmother   . Cancer Brother   . Hypertension Sister   . Colon cancer Neg Hx   . Food intolerance Neg Hx   . Esophageal cancer Neg Hx   . Rectal cancer Neg Hx   . Stomach cancer Neg Hx     New complaints: Had endoscopy yesterday due to abdominal pain and that was negative. Still having left upper quadrant pain with blotting. She is on daily miralax. She still clalims that pain is unassociated with eating. She has some pain relief is she has a bowel movement.  Social history: Lives by herself and her daughters check on her daily  Controlled substance contract: 10/26/18    Review of Systems  Constitutional: Negative for activity change and appetite change.  HENT: Negative.   Eyes: Negative for pain.  Respiratory: Negative for shortness of breath.   Cardiovascular: Negative for chest pain, palpitations and leg swelling.  Gastrointestinal: Negative for abdominal pain.  Endocrine: Negative for polydipsia.  Genitourinary: Negative.   Skin: Negative for rash.  Neurological: Negative for dizziness, weakness and headaches.  Hematological: Does not bruise/bleed easily.  Psychiatric/Behavioral: Negative.   All other systems reviewed and are negative.      Objective:   Physical Exam Vitals and nursing note reviewed.  Constitutional:      General: She is not in acute distress.    Appearance: Normal appearance. She is well-developed.  HENT:     Head: Normocephalic.     Nose: Nose normal.  Eyes:     Pupils: Pupils are equal, round, and reactive to light.  Neck:     Vascular: No carotid bruit or JVD.  Cardiovascular:     Rate and Rhythm: Normal rate and regular rhythm.     Heart sounds: Normal heart sounds.  Pulmonary:     Effort: Pulmonary effort is normal. No respiratory distress.     Breath sounds: Normal breath sounds. No wheezing or rales.  Chest:     Chest wall: No tenderness.  Abdominal:     General: Bowel sounds are normal. There is no distension or abdominal bruit.      Palpations: Abdomen is soft. There is no hepatomegaly, splenomegaly, mass or pulsatile mass.     Tenderness: There is no abdominal tenderness.  Musculoskeletal:        General: Normal range of motion.     Cervical back: Normal range of motion and neck supple.  Lymphadenopathy:     Cervical: No cervical adenopathy.  Skin:    General: Skin is warm and dry.  Neurological:     Mental Status: She is  alert and oriented to person, place, and time.     Deep Tendon Reflexes: Reflexes are normal and symmetric.  Psychiatric:        Behavior: Behavior normal.        Thought Content: Thought content normal.        Judgment: Judgment normal.      BP 136/88 (BP Location: Left Arm)   Pulse 84   Temp (!) 97.1 F (36.2 C) (Temporal)   Resp 20   Ht '5\' 3"'  (1.6 m)   Wt 179 lb (81.2 kg)   LMP 05/11/1992   SpO2 98%   BMI 31.71 kg/m   HGBA1c 6.1      Assessment & Plan:  TACOYA ALTIZER comes in today with chief complaint of Medical Management of Chronic Issues   Diagnosis and orders addressed:  1. Essential hypertension Low sodium diet - CMP14+EGFR - diltiazem (CARDIZEM CD) 360 MG 24 hr capsule; Take 1 capsule (360 mg total) by mouth daily.  Dispense: 90 capsule; Refill: 3 - metoprolol (TOPROL-XL) 200 MG 24 hr tablet; TAKE 1 AND 1/2 TABLET DAILY  Dispense: 135 tablet; Refill: 1  2. Atrial fibrillation with RVR (HCC) Avoid caffeine  3. Hypokalemia Report any lower ext cramping  4. Mixed hyperlipidemia Low fat diet - Lipid panel - pravastatin (PRAVACHOL) 80 MG tablet; Take 1 tablet (80 mg total) by mouth every other day.  Dispense: 90 tablet; Refill: 1  5. Other specified hypothyroidism - levothyroxine (SYNTHROID) 75 MCG tablet; Take 1 tablet (75 mcg total) by mouth daily.  Dispense: 90 tablet; Refill: 1  6. Diabetes mellitus without complication ([place]) Continue to watch carbs in diet - hgba1c - Microalbumin / creatinine urine ratio - metFORMIN (GLUCOPHAGE) 500 MG tablet;  Take 1 tablet (500 mg total) by mouth daily.  Dispense: 90 tablet; Refill: 1  7. Peripheral edema Elevate legs when sitting  8. Recurrent major depressive disorder, in partial remission ([place]) Stress management  9. GAD (generalized anxiety disorder) - clonazePAM (KLONOPIN) 0.5 MG tablet; Take 1 tablet (0.5 mg total) by mouth 2 (two) times daily as needed.  Dispense: 60 tablet; Refill: 3  10. BMI 34.0-34.9,adult Discussed diet and exercise for person with BMI >25 Will recheck weight in 3-6 months  11. Left upper quadrant pain ducolax OTC and clean self out good and see if helps with left upper quadrant pain   Labs pending Health Maintenance reviewed Diet and exercise encouraged  Follow up plan: 3 months   [place], FNP

## 2019-01-27 NOTE — Patient Instructions (Signed)
Constipation, Adult Constipation is when a person:  Poops (has a bowel movement) fewer times in a week than normal.  Has a hard time pooping.  Has poop that is dry, hard, or bigger than normal. Follow these instructions at home: Eating and drinking   Eat foods that have a lot of fiber, such as: ? Fresh fruits and vegetables. ? Whole grains. ? Beans.  Eat less of foods that are high in fat, low in fiber, or overly processed, such as: ? [place] fries. ? Hamburgers. ? Cookies. ? Candy. ? Soda.  Drink enough fluid to keep your pee (urine) clear or pale yellow. General instructions  Exercise regularly or as told by your doctor.  Go to the restroom when you feel like you need to poop. Do not hold it in.  Take over-the-counter and prescription medicines only as told by your doctor. These include any fiber supplements.  Do pelvic floor retraining exercises, such as: ? Doing deep breathing while relaxing your lower belly (abdomen). ? Relaxing your pelvic floor while pooping.  Watch your condition for any changes.  Keep all follow-up visits as told by your doctor. This is important. Contact a doctor if:  You have pain that gets worse.  You have a fever.  You have not pooped for 4 days.  You throw up (vomit).  You are not hungry.  You lose weight.  You are bleeding from the anus.  You have thin, pencil-like poop (stool). Get help right away if:  You have a fever, and your symptoms suddenly get worse.  You leak poop or have blood in your poop.  Your belly feels hard or bigger than normal (is bloated).  You have very bad belly pain.  You feel dizzy or you faint. This information is not intended to replace advice given to you by your health care provider. Make sure you discuss any questions you have with your health care provider. Document Released: 07/23/2007 Document Revised: 01/16/2017 Document Reviewed: 07/25/2015 Elsevier Patient Education  2020 [employer].

## 2019-01-28 ENCOUNTER — Telehealth: Payer: Self-pay | Admitting: *Deleted

## 2019-01-28 ENCOUNTER — Telehealth: Payer: Self-pay

## 2019-01-28 LAB — CMP14+EGFR
ALT: 16 IU/L (ref 0–32)
AST: 18 IU/L (ref 0–40)
Albumin/Globulin Ratio: 2 (ref 1.2–2.2)
Albumin: 4.2 g/dL (ref 3.6–4.6)
Alkaline Phosphatase: 68 IU/L (ref 39–117)
BUN/Creatinine Ratio: 5 — ABNORMAL LOW (ref 12–28)
BUN: 5 mg/dL — ABNORMAL LOW (ref 8–27)
Bilirubin Total: 0.5 mg/dL (ref 0.0–1.2)
CO2: 24 mmol/L (ref 20–29)
Calcium: 8.9 mg/dL (ref 8.7–10.3)
Chloride: 102 mmol/L (ref 96–106)
Creatinine, Ser: 0.92 mg/dL (ref 0.57–1.00)
GFR calc Af Amer: 68 mL/min/{1.73_m2} (ref >59)
GFR calc non Af Amer: 59 mL/min/{1.73_m2} — ABNORMAL LOW (ref >59)
Globulin, Total: 2.1 g/dL (ref 1.5–4.5)
Glucose: 110 mg/dL — ABNORMAL HIGH (ref 65–99)
Potassium: 3.9 mmol/L (ref 3.5–5.2)
Sodium: 143 mmol/L (ref 134–144)
Total Protein: 6.3 g/dL (ref 6.0–8.5)

## 2019-01-28 LAB — LIPID PANEL
Chol/HDL Ratio: 3.3 ratio (ref 0.0–4.4)
Cholesterol, Total: 173 mg/dL (ref 100–199)
HDL: 52 mg/dL (ref >39)
LDL Chol Calc (NIH): 101 mg/dL — ABNORMAL HIGH (ref 0–99)
Triglycerides: 108 mg/dL (ref 0–149)
VLDL Cholesterol Cal: 20 mg/dL (ref 5–40)

## 2019-01-28 LAB — MICROALBUMIN / CREATININE URINE RATIO
Creatinine, Urine: 96.6 mg/dL
Microalb/Creat Ratio: 109 mg/g creat — ABNORMAL HIGH (ref 0–29)
Microalbumin, Urine: 104.9 ug/mL

## 2019-01-28 NOTE — Telephone Encounter (Signed)
  Follow up Call-  Call back number 01/26/2019  Post procedure Call Back phone  # [phone]  Permission to leave phone message Yes  Some recent data might be hidden     Patient questions:  Do you have a fever, pain , or abdominal swelling? No. Pain Score  0 *  Have you tolerated food without any problems? Yes.    Have you been able to return to your normal activities? Yes.    Do you have any questions about your discharge instructions: Diet   No. Medications  No. Follow up visit  No.  Do you have questions or concerns about your Care? No.  Actions: * If pain score is 4 or above: No action needed, pain <4.  1. Have you developed a fever since your procedure? no  2.   Have you had an respiratory symptoms (SOB or cough) since your procedure? no  3.   Have you tested positive for COVID 19 since your procedure no  4.   Have you had any family members/close contacts diagnosed with the COVID 19 since your procedure?  no   If yes to any of these questions please route to Joylene John, RN and Alphonsa Gin, Therapist, sports.

## 2019-01-28 NOTE — Telephone Encounter (Signed)
NO ANSWER, MESSAGE LEFT FOR PATIENT. 

## 2019-02-14 ENCOUNTER — Ambulatory Visit (INDEPENDENT_AMBULATORY_CARE_PROVIDER_SITE_OTHER): Payer: Medicare HMO | Admitting: Nurse Practitioner

## 2019-02-14 DIAGNOSIS — K219 Gastro-esophageal reflux disease without esophagitis: Secondary | ICD-10-CM

## 2019-02-14 MED ORDER — PANTOPRAZOLE SODIUM 40 MG PO TBEC: 40.0000 mg | DELAYED_RELEASE_TABLET | Freq: Every day | ORAL | 3 refills | Status: DC

## 2019-02-14 NOTE — Progress Notes (Signed)
Virtual Visit via telephone Note Due to COVID-19 pandemic this visit was conducted virtually. This visit type was conducted due to national recommendations for restrictions regarding the COVID-19 Pandemic (e.g. social distancing, sheltering in place) in an effort to limit this patient's exposure and mitigate transmission in our community. All issues noted in this document were discussed and addressed.  A physical exam was not performed with this format.  I connected with Veronica Paul on 02/14/19 at 9:55 by telephone and verified that I am speaking with the correct person using two identifiers. Veronica Paul is currently located at home and her daughtr is currently with her during visit. The provider, [name], FNP is located in their office at time of visit.  I discussed the limitations, risks, security and privacy concerns of performing an evaluation and management service by telephone and the availability of in person appointments. I also discussed with the patient that there may be a patient responsible charge related to this service. The patient expressed understanding and agreed to proceed.   History and Present Illness:   Chief Complaint: Abdominal Pain   HPI Patient calls in today c/o abdominal pain. She has been having this pain for several months now. We have done several scans but cannot find any cause for the pain. Pain has gotten better since we started her on dulcolax 2 every 3 days. Now she just has reflux in which we have given her  prilosec OTC which is really helping but seems to be wearing off throughout the day.   Review of Systems  Constitutional: Negative for diaphoresis and weight loss.  Eyes: Negative for blurred vision, double vision and pain.  Respiratory: Negative for shortness of breath.   Cardiovascular: Negative for chest pain, palpitations, orthopnea and leg swelling.  Gastrointestinal: Positive for abdominal pain (slight).  Skin: Negative for  rash.  Neurological: Negative for dizziness, sensory change, loss of consciousness, weakness and headaches.  Endo/Heme/Allergies: Negative for polydipsia. Does not bruise/bleed easily.  Psychiatric/Behavioral: Negative for memory loss. The patient does not have insomnia.   All other systems reviewed and are negative.    Observations/Objective: Alert and oriented- answers all questions appropriately No distress No pain on palpation of abdomen   Assessment and Plan: Veronica Paul in today with chief complaint of Abdominal Pain   1. Gastroesophageal reflux disease without esophagitis First 24 Hours-Clear liquids  popsicles  Jello  gatorade  Sprite Second 24 hours-Add Full liquids ( Liquids you cant see through) Third 24 hours- Bland diet ( foods that are baked or broiled)  *avoiding fried foods and highly spiced foods* During these 3 days  Avoid milk, cheese, ice cream or any other dairy products  Avoid caffeine- REMEMBER Mt. Dew and Mello Yellow contain lots of caffeine You should eat and drink in  Frequent small volumes If no improvement in symptoms or worsen in 2-3 days should RETRUN TO OFFICE or go to ER!    - pantoprazole (PROTONIX) 40 MG tablet; Take 1 tablet (40 mg total) by mouth daily.  Dispense: 30 tablet; Refill: 3   Follow Up Instructions: prn    I discussed the assessment and treatment plan with the patient. The patient was provided an opportunity to ask questions and all were answered. The patient agreed with the plan and demonstrated an understanding of the instructions.   The patient was advised to call back or seek an in-person evaluation if the symptoms worsen or if the condition fails to improve as  anticipated.  The above assessment and management plan was discussed with the patient. The patient verbalized understanding of and has agreed to the management plan. Patient is aware to call the clinic if symptoms persist or worsen. Patient is aware when to  return to the clinic for a follow-up visit. Patient educated on when it is appropriate to go to the emergency department.   Time call ended:  10:10 I provided 15 minutes of non-face-to-face time during this encounter.    [name], FNP

## 2019-02-21 DIAGNOSIS — R69 Illness, unspecified: Secondary | ICD-10-CM | POA: Diagnosis not present

## 2019-03-08 ENCOUNTER — Encounter: Payer: Self-pay | Admitting: Nurse Practitioner

## 2019-03-08 DIAGNOSIS — R69 Illness, unspecified: Secondary | ICD-10-CM | POA: Diagnosis not present

## 2019-03-08 LAB — H PYLORI, IGM, IGG, IGA AB
H pylori, IgM Abs: <9 units (ref 0.0–8.9)
H. pylori, IgA Abs: <9 units (ref 0.0–8.9)
H. pylori, IgG AbS: 0.86 Index Value — ABNORMAL HIGH (ref 0.00–0.79)

## 2019-03-11 ENCOUNTER — Telehealth: Payer: Self-pay | Admitting: *Deleted

## 2019-03-11 NOTE — Telephone Encounter (Signed)
Spoke with daughter regarding below Veronica Paul.   Per daughter patient has not had any energy to cook or do anything since July She is disoriented at times Has left sided abdominal pain described as squeezing sensations, constant pain. Multiple studies done by PCP and ok Abdominal swelling/bloated  Denies any change in breathing, swelling in ankles or feet, chest pain, black stools, or visible bleeding.  Daughter is very concerned about her and that may be side effects to newer medications.  Per daughter she has just not been the same since she left the hospital in June   Dr. Percival Spanish,   This is [name], [name] Kadow's daughter.  Tuyen is a patient of yours.  My sister Jenny Reichmann, and I are very worried about Momma and wanted to voice our concerns with you to see if there is anything we can do to help momma feel better.  Momma went to the hospital on August 17, 2018 due to afib.  She was put on two new meds--Diltiazem 360 mg daily and Eliquis 5 mg tablet twice daily.  She has taken these  since July.  Since July, 7 months ago, she began having upper abdomen pain and abdomen swelling. The pain is so bad at times, she wants to go to the hospital.  Family dr has done all she can to help. We have had xrays, endoscopy, scans, bloodwork, etc. Mrs Earlie Server (family dr) last message was that she does not know what else to do.  I am not sure, but think she may be having side effects from the eloquis.  Is there a alternative medication we can try.  She is in daily pain now and severe abdominal swelling.  My phone number is [phone].  I will also check here.  Will forward to Dr Percival Spanish for review

## 2019-03-13 NOTE — Telephone Encounter (Signed)
I have time to see her in the office this week.

## 2019-03-14 NOTE — Telephone Encounter (Signed)
Patient has been scheduled for 1/28

## 2019-03-15 DIAGNOSIS — I482 Chronic atrial fibrillation, unspecified: Secondary | ICD-10-CM | POA: Insufficient documentation

## 2019-03-15 DIAGNOSIS — R931 Abnormal findings on diagnostic imaging of heart and coronary circulation: Secondary | ICD-10-CM | POA: Insufficient documentation

## 2019-03-15 DIAGNOSIS — R109 Unspecified abdominal pain: Secondary | ICD-10-CM | POA: Insufficient documentation

## 2019-03-15 DIAGNOSIS — Z7189 Other specified counseling: Secondary | ICD-10-CM | POA: Insufficient documentation

## 2019-03-15 NOTE — Progress Notes (Signed)
Cardiology Office Note   Date:  03/17/2019   ID:  Veronica Paul, DOB Dec 28, 1935, MRN [id number]  PCP:  Chevis Pretty, FNP  Cardiologist:   Minus Breeding, MD   Chief Complaint  Patient presents with  . Abdominal Pain      History of Present Illness: Veronica Paul is a 84 y.o. female who presents for evaluation abdominal pain.  I have seen her for atrial fib .  Her daughters called to see if there was anything that I could do for her abdominal pain.  She has had an extensive work up with no clear etiology to the pain.   She had a negative EGD.   CT demonstrated gallstones.   Hepatobiliary scan demonstrated a sluggish gallbladder.  She had scattered diverticula.   She has uterine masses that are likely fibroids.   She has been managed with a couple of antibiotics and antacids.  She has been managed with multiple over-the-counter medicines and nothing seems to help with his chest pain.  The pain happens when she gets up and starts to move around.  She feels like she is that is squeezing discomfort.  Its moderate in intensity.  She has abdominal bloating.  She is lost about 25 pounds even though she is eating.  She is not describing midsternal discomfort.  She is not describing new shortness of breath, PND or orthopnea.  She said no palpitations, presyncope or syncope.  She has had coronary calcification and aortic atherosclerosis noted on the CT that was done of her abdomen pelvis.  She really does not notice her atrial fibrillation.  She says she has not had any problems taking her blood thinners.   Past Medical History:  Diagnosis Date  . Allergy    "my nose runs alot"  . Anxiety   . Arthritis   . Atrial fibrillation ([place])   . Blood transfusion without reported diagnosis    "when I had a miscarrage in 1957"  . Cataract    bilateral removed  . Cholelithiasis   . Depression   . Diabetes mellitus   . Dyslipidemia   . HTN (hypertension)   . Hx of long-term (current) use  of anticoagulants   . Hyperlipidemia   . Hypertension   . Hypothyroidism   . Lung cancer ([place])   . Obesity   . Osteopenia   . Vitamin D deficiency     Past Surgical History:  Procedure Laterality Date  . CATARACT EXTRACTION    . COLONOSCOPY    . LUNG REMOVAL, PARTIAL  1998   Right  . THYROIDECTOMY    . TUBAL LIGATION       Current Outpatient Medications  Medication Sig Dispense Refill  . apixaban (ELIQUIS) 5 MG TABS tablet Take 1 tablet (5 mg total) by mouth 2 (two) times daily. 60 tablet 5  . Blood Glucose Monitoring Suppl (ONETOUCH VERIO) w/Device KIT Check blood sugars daily Dx E11.9 1 kit 0  . clonazePAM (KLONOPIN) 0.5 MG tablet Take 1 tablet (0.5 mg total) by mouth 2 (two) times daily as needed. 60 tablet 3  . diltiazem (CARDIZEM CD) 360 MG 24 hr capsule Take 1 capsule (360 mg total) by mouth daily. 90 capsule 3  . fish oil-omega-3 fatty acids 1000 MG capsule Take 2 g by mouth daily.      . furosemide (LASIX) 20 MG tablet Take 0.5 tablets (10 mg total) by mouth daily as needed. Take 1/2 tablet as needed 3 times weekly  90 tablet 1  . glucose blood (ONETOUCH VERIO) test strip Check blood sugars daily Dx E11.9 100 each 3  . Lancets (ONETOUCH ULTRASOFT) lancets Check blood sugars daily Dx E11.9 100 each 3  . levothyroxine (SYNTHROID) 75 MCG tablet Take 1 tablet (75 mcg total) by mouth daily. 90 tablet 1  . metFORMIN (GLUCOPHAGE) 500 MG tablet Take 1 tablet (500 mg total) by mouth daily. 90 tablet 1  . metoprolol (TOPROL-XL) 200 MG 24 hr tablet TAKE 1 AND 1/2 TABLET DAILY 135 tablet 1  . nitroGLYCERIN (NITROSTAT) 0.4 MG SL tablet PLACE 1 TABLET (0.4 MG TOTAL) UNDER THE TONGUE EVERY 5 (FIVE) MINUTES AS NEEDED FOR CHEST PAIN. 25 tablet 1  . pantoprazole (PROTONIX) 40 MG tablet Take 1 tablet (40 mg total) by mouth daily. 30 tablet 3  . polyethylene glycol (MIRALAX / GLYCOLAX) 17 g packet Take 17 g by mouth daily as needed.     No current facility-administered medications for this  visit.    Allergies:   Ace inhibitors, Feldene [piroxicam], Meloxicam, Prevnar [pneumococcal 13-val conj vacc], Statins, Sulfa antibiotics, and Zithromax [azithromycin dihydrate]    ROS:  Please see the history of present illness.   Otherwise, review of systems are positive for onstipation, leg weakness,  lack of energy, and fatigue none.   All other systems are reviewed and negative.    PHYSICAL EXAM: VS:  BP (!) 151/81   Pulse 94   Ht '5\' 3"'  (1.6 m)   Wt 175 lb 12.8 oz (79.7 kg)   LMP 05/11/1992   SpO2 99%   BMI 31.14 kg/m  , BMI Body mass index is 31.14 kg/m. GENERAL:  Well appearing NECK:  No jugular venous distention, waveform within normal limits, carotid upstroke brisk and symmetric, no bruits, no thyromegaly LUNGS:  Clear to auscultation bilaterally CHEST:  Unremarkable HEART:  PMI not displaced or sustained,S1 and S2 within normal limits, no S3, no clicks, no rubs, no murmurs, irregular ABD:  Flat, positive bowel sounds normal in frequency in pitch, no bruits, no rebound, no guarding, no midline pulsatile mass, no hepatomegaly, no splenomegaly EXT:  2 plus pulses throughout, no edema, no cyanosis no clubbing   EKG:  EKG is not ordered today. Atrial fibrillation, rate 94, axis within normal limits, intervals within normal limits, poor anterior R wave progression.  Possible old anteroseptal infarct.   Recent Labs: 08/17/2018: B Natriuretic Peptide 402.0; Magnesium 2.1 10/22/2018: TSH 9.710 11/08/2018: Hemoglobin 13.7; Platelets 265 01/27/2019: ALT 16; BUN 5; Creatinine, Ser 0.92; Potassium 3.9; Sodium 143    Lipid Panel    Component Value Date/Time   CHOL 173 01/27/2019 0803   CHOL 100 05/07/2012 0957   TRIG 108 01/27/2019 0803   TRIG 148 06/21/2014 0932   TRIG 150 (H) 05/07/2012 0957   HDL 52 01/27/2019 0803   HDL 56 06/21/2014 0932   HDL 40 05/07/2012 0957   CHOLHDL 3.3 01/27/2019 0803   LDLCALC 101 (H) 01/27/2019 0803   LDLCALC 49 12/02/2013 0923   LDLCALC 30  05/07/2012 0957      Wt Readings from Last 3 Encounters:  03/17/19 175 lb 12.8 oz (79.7 kg)  01/27/19 179 lb (81.2 kg)  01/26/19 178 lb (80.7 kg)      Other studies Reviewed: Additional studies/ records that were reviewed today include:  Multiple GI studies reviewed as above. . Review of the above records demonstrates:  Please see elsewhere in the note.     ASSESSMENT AND PLAN:  Atrial fibrillation:  Veronica Paul has a CHA2DS2 - VASc score of 5.  She tolerates anticoagulation.  Her rate control seems to be reasonable.  No change in therapy.  Abdominal pain: Her pain would be unusual for angina but there is been no other clear etiology.  Her providers are convinced it is not her gallbladder given her pain being located on the left side.  I do not think is an anginal equivalent but she certainly has an abnormal EKG, calcium on her scans and significant risk factors.  Therefore, I will screen her with a perfusion study.  She would not be able to walk on a treadmill.  I will stop her pravastatin just to see if it helps with any of her symptoms.  Coronary artery calcium: This will be evaluated as above.  Hypertension:   The blood pressure is at target.   Chronic constipation:   I suspect I could stop the Cardizem later if her perfusion study comes back negative and there is no other etiology.  I do not think this is causing the symptoms.  We would have to go up on the beta-blocker.  Covid education.   She has had her first shot.   Current medicines are reviewed at length with the patient today.  The patient does not have concerns regarding medicines.  The following changes have been made:  As above  Labs/ tests ordered today include:   Orders Placed This Encounter  Procedures  . MYOCARDIAL PERFUSION IMAGING  . EKG 12-Lead     Disposition:   FU with me in six months.     Signed, Minus Breeding, MD  03/17/2019 9:36 AM    [facility]

## 2019-03-17 ENCOUNTER — Encounter: Payer: Self-pay | Admitting: Cardiology

## 2019-03-17 ENCOUNTER — Other Ambulatory Visit: Payer: Self-pay

## 2019-03-17 ENCOUNTER — Ambulatory Visit: Payer: Medicare HMO | Admitting: Cardiology

## 2019-03-17 VITALS — BP 151/81 | HR 94 | Ht 63.0 in | Wt 175.8 lb

## 2019-03-17 DIAGNOSIS — R109 Unspecified abdominal pain: Secondary | ICD-10-CM

## 2019-03-17 DIAGNOSIS — R931 Abnormal findings on diagnostic imaging of heart and coronary circulation: Secondary | ICD-10-CM | POA: Diagnosis not present

## 2019-03-17 DIAGNOSIS — I2510 Atherosclerotic heart disease of native coronary artery without angina pectoris: Secondary | ICD-10-CM

## 2019-03-17 DIAGNOSIS — R079 Chest pain, unspecified: Secondary | ICD-10-CM

## 2019-03-17 DIAGNOSIS — I482 Chronic atrial fibrillation, unspecified: Secondary | ICD-10-CM

## 2019-03-17 DIAGNOSIS — Z7189 Other specified counseling: Secondary | ICD-10-CM

## 2019-03-17 NOTE — Patient Instructions (Signed)
Medication Instructions:  STOP PRAVASTATIN *If you need a refill on your cardiac medications before your next appointment, please call your pharmacy*  Lab Work: NONE  Testing/Procedures: Your physician has requested that you have a lexiscan myoview. For further information please visit [email or web address]. Please follow instruction sheet, as given.   Follow-Up: At [facility], you and your health needs are our priority.  As part of our continuing mission to provide you with exceptional heart care, we have created designated Provider Care Teams.  These Care Teams include your primary Cardiologist (physician) and Advanced Practice Providers (APPs -  Physician Assistants and Nurse Practitioners) who all work together to provide you with the care you need, when you need it.  Your next appointment:   4 month(s) in [facility] will receive a reminder letter in the mail two months in advance. If you don't receive a letter, please call our office to schedule the follow-up appointment.   The format for your next appointment:   In Person  Provider:   Minus Breeding, MD

## 2019-03-18 ENCOUNTER — Encounter: Payer: Self-pay | Admitting: Nurse Practitioner

## 2019-03-22 ENCOUNTER — Telehealth (HOSPITAL_COMMUNITY): Payer: Self-pay

## 2019-03-22 NOTE — Telephone Encounter (Signed)
Encounter complete. 

## 2019-03-24 ENCOUNTER — Other Ambulatory Visit: Payer: Self-pay

## 2019-03-24 ENCOUNTER — Ambulatory Visit (HOSPITAL_COMMUNITY)
Admission: RE | Admit: 2019-03-24 | Discharge: 2019-03-24 | Disposition: A | Discharge status: Home / Self Care | Payer: Medicare HMO | Source: Ambulatory Visit | Attending: Cardiovascular Disease | Admitting: Cardiovascular Disease

## 2019-03-24 DIAGNOSIS — R079 Chest pain, unspecified: Secondary | ICD-10-CM

## 2019-03-24 DIAGNOSIS — I2510 Atherosclerotic heart disease of native coronary artery without angina pectoris: Secondary | ICD-10-CM

## 2019-03-24 LAB — MYOCARDIAL PERFUSION IMAGING
Peak HR: 121 {beats}/min
Rest HR: 84 {beats}/min
SDS: 1
SRS: 2
SSS: 3
TID: 0.99

## 2019-03-24 MED ORDER — REGADENOSON 0.4 MG/5ML IV SOLN
0.4000 mg | Freq: Once | INTRAVENOUS | Status: AC
  Administered 2019-03-24: 0.4 mg via INTRAVENOUS

## 2019-03-24 MED ORDER — TECHNETIUM TC 99M TETROFOSMIN IV KIT
9.3000 | PACK | Freq: Once | INTRAVENOUS | Status: AC | PRN
  Administered 2019-03-24: 9.3 via INTRAVENOUS
  Filled 2019-03-24: qty 10

## 2019-03-24 MED ORDER — TECHNETIUM TC 99M TETROFOSMIN IV KIT
30.9000 | PACK | Freq: Once | INTRAVENOUS | Status: AC | PRN
  Administered 2019-03-24: 30.9 via INTRAVENOUS
  Filled 2019-03-24: qty 31

## 2019-03-26 ENCOUNTER — Encounter: Payer: Self-pay | Admitting: Nurse Practitioner

## 2019-04-04 ENCOUNTER — Telehealth: Payer: Self-pay | Admitting: Cardiology

## 2019-04-04 NOTE — Telephone Encounter (Signed)
Results given to patient's daughter Ms. Wheatley for myocardial perfusion. Understanding verbalized.

## 2019-04-04 NOTE — Telephone Encounter (Signed)
Patient's daughter calling for test results.

## 2019-04-05 ENCOUNTER — Encounter: Payer: Self-pay | Admitting: Nurse Practitioner

## 2019-04-12 ENCOUNTER — Telehealth: Payer: Self-pay

## 2019-04-12 DIAGNOSIS — R0602 Shortness of breath: Secondary | ICD-10-CM

## 2019-04-12 NOTE — Telephone Encounter (Signed)
Chest xray order placed. 

## 2019-04-12 NOTE — Telephone Encounter (Signed)
No answer, mailbox full

## 2019-04-12 NOTE — Telephone Encounter (Signed)
[name] had spoken with you via MyChart about her mother's breathing.  You advised they should have visit and have chest xray.  She has made a tele visit appointment for her on Thursday at 9:00 am.  She would like to know if you will go ahead and order the chest xray and she will bring her in tomorrow for that so you will have the results for her appointment on Thursday. Please advise.

## 2019-04-13 NOTE — Telephone Encounter (Signed)
Will bring mother at 8:00 am in the morning to have chest xray.  Phone visit with Chevis Pretty at 9:00 am.

## 2019-04-14 ENCOUNTER — Other Ambulatory Visit: Payer: Self-pay

## 2019-04-14 ENCOUNTER — Ambulatory Visit (INDEPENDENT_AMBULATORY_CARE_PROVIDER_SITE_OTHER): Payer: Medicare HMO

## 2019-04-14 ENCOUNTER — Encounter: Payer: Self-pay | Admitting: Nurse Practitioner

## 2019-04-14 ENCOUNTER — Ambulatory Visit (INDEPENDENT_AMBULATORY_CARE_PROVIDER_SITE_OTHER): Payer: Medicare HMO | Admitting: Nurse Practitioner

## 2019-04-14 DIAGNOSIS — R1030 Lower abdominal pain, unspecified: Secondary | ICD-10-CM

## 2019-04-14 DIAGNOSIS — R0602 Shortness of breath: Secondary | ICD-10-CM | POA: Diagnosis not present

## 2019-04-14 NOTE — Progress Notes (Signed)
Virtual Visit via telephone Note Due to COVID-19 pandemic this visit was conducted virtually. This visit type was conducted due to national recommendations for restrictions regarding the COVID-19 Pandemic (e.g. social distancing, sheltering in place) in an effort to limit this patient's exposure and mitigate transmission in our community. All issues noted in this document were discussed and addressed.  A physical exam was not performed with this format.  I connected with Veronica Paul on 04/14/19 at 9:05 by telephone and verified that I am speaking with the correct person using two identifiers. Veronica Paul is currently located at home and her daughter [name] is currently with her during visit. The provider, [name], FNP is located in their office at time of visit.  I discussed the limitations, risks, security and privacy concerns of performing an evaluation and management service by telephone and the availability of in person appointments. I also discussed with the patient that there may be a patient responsible charge related to this service. The patient expressed understanding and agreed to proceed.   History and Present Illness:   Chief Complaint: lower abdominal pain and shortness of breath   HPI: Patient calls in to discuss her breathing. She says when she takes a deep breath, that it feels like she is going to throw up. She says it makes her belly feel like it is going to bust wide open. Breathing issue improves with klonopin. She verified that her the feeling in her stomach is in her lower stomach. She says the discomfort in her lower abdomen is what bothers her the most. She says the pain can get to a 9/10 at times. The pain is not constant, she has good days and bad days. She takes gasx and that seems to help, and having a bowel movement will sometimes relieve her pain. She says she has a bowel movement everyday. She takes dulcolax 2x a week to stay regular and she sees no  difference in pain on the days she takes a dulcolax. She describes the pain as a  Heavy feeling, not stabbing. She says it is currently not bothering her right now. She does experience the pain everyday. Pain can last all day to just over an hour. Her daughter says that her belly is swollen.  *she went into the office and had a chest xray this morning prior to appointment.    Outpatient Encounter Medications as of 04/14/2019  Medication Sig  . apixaban (ELIQUIS) 5 MG TABS tablet Take 1 tablet (5 mg total) by mouth 2 (two) times daily.  . Blood Glucose Monitoring Suppl (ONETOUCH VERIO) w/Device KIT Check blood sugars daily Dx E11.9  . clonazePAM (KLONOPIN) 0.5 MG tablet Take 1 tablet (0.5 mg total) by mouth 2 (two) times daily as needed.  . diltiazem (CARDIZEM CD) 360 MG 24 hr capsule Take 1 capsule (360 mg total) by mouth daily.  . fish oil-omega-3 fatty acids 1000 MG capsule Take 2 g by mouth daily.    . furosemide (LASIX) 20 MG tablet Take 0.5 tablets (10 mg total) by mouth daily as needed. Take 1/2 tablet as needed 3 times weekly  . glucose blood (ONETOUCH VERIO) test strip Check blood sugars daily Dx E11.9  . Lancets (ONETOUCH ULTRASOFT) lancets Check blood sugars daily Dx E11.9  . levothyroxine (SYNTHROID) 75 MCG tablet Take 1 tablet (75 mcg total) by mouth daily.  . metFORMIN (GLUCOPHAGE) 500 MG tablet Take 1 tablet (500 mg total) by mouth daily.  . metoprolol (TOPROL-XL)  200 MG 24 hr tablet TAKE 1 AND 1/2 TABLET DAILY  . nitroGLYCERIN (NITROSTAT) 0.4 MG SL tablet PLACE 1 TABLET (0.4 MG TOTAL) UNDER THE TONGUE EVERY 5 (FIVE) MINUTES AS NEEDED FOR CHEST PAIN.  Marland Kitchen pantoprazole (PROTONIX) 40 MG tablet Take 1 tablet (40 mg total) by mouth daily.  . polyethylene glycol (MIRALAX / GLYCOLAX) 17 g packet Take 17 g by mouth daily as needed.     Past Surgical History:  Procedure Laterality Date  . CATARACT EXTRACTION    . COLONOSCOPY    . LUNG REMOVAL, PARTIAL  1998   Right  . THYROIDECTOMY     . TUBAL LIGATION      Family History  Problem Relation Age of Onset  . Coronary artery disease Brother 107  . Hypertension Brother   . Heart disease Brother   . Hypertension Mother   . Hypertension Father   . Heart disease Father   . Hypertension Sister   . Heart disease Sister   . Arthritis Daughter   . COPD Daughter   . Fibromyalgia Daughter   . Ulcerative colitis Son   . Stroke Maternal Grandmother   . Cancer Brother   . Hypertension Sister   . Colon cancer Neg Hx   . Food intolerance Neg Hx   . Esophageal cancer Neg Hx   . Rectal cancer Neg Hx   . Stomach cancer Neg Hx     New complaints: Nothing new other than what was stated  Social history: Her daughter stays with her  Controlled substance contract: n/a    Review of Systems  Constitutional: Positive for weight loss (over 30lbs in the last year).  Gastrointestinal: Positive for abdominal pain (intermittent). Negative for constipation, diarrhea, heartburn, nausea and vomiting.     Observations/Objective: Alert and oriented- answers all questions appropriately Moderate- severe at times distress  Chest xray- aortic calcifications- other wise unchanged from previous-Preliminary reading by Ronnald Collum, FNP  [facility]   Assessment and Plan: Veronica Paul in today with chief complaint of Breathing Problem   1. Lower abdominal pain Sent message to dr. Loletha Carrow about possible colonoscopy Keep a diary of pain- when it occurs, how bad it is and if anything occurs prior to pain relief. Keep diary of bowel moe=vements and foods consumed. - Ambulatory referral to Gynecology- urgent     Follow Up Instructions: prn    I discussed the assessment and treatment plan with the patient. The patient was provided an opportunity to ask questions and all were answered. The patient agreed with the plan and demonstrated an understanding of the instructions.   The patient was advised to call back or seek an in-person  evaluation if the symptoms worsen or if the condition fails to improve as anticipated.  The above assessment and management plan was discussed with the patient. The patient verbalized understanding of and has agreed to the management plan. Patient is aware to call the clinic if symptoms persist or worsen. Patient is aware when to return to the clinic for a follow-up visit. Patient educated on when it is appropriate to go to the emergency department.   Time call ended:  9:35  I provided 30 minutes of non-face-to-face time during this encounter.    [name], FNP

## 2019-04-18 ENCOUNTER — Telehealth: Payer: Self-pay | Admitting: Gastroenterology

## 2019-04-18 NOTE — Telephone Encounter (Signed)
This patient saw me for an EGD in December for LUQ pain.  PCP messaged me that pain ongoing along with weight loss -now pain reportedly more in the lower abdomen.  Uterine fibroid found on CT scan, but not felt likely to be source of pain.  PCP asking whether colonoscopy should be pursued at this point.  If Veronica Paul (and her daughter - who helps manage her medical issues) is agreeable, I am willing to perform a colonoscopy.  Must be able to undergo the required bowel prep.  If wants to pursue that, please make the arrangements.    She is on Eliquis, and can take the last dose in AM the day prior to procedure.  She was able to hold the medicine briefly before the EGD within last few months, so I do not feel we need new authorization from her cardiologist on that.  If she would prefer, she is also welcome to make an appointment to see me first.

## 2019-04-19 ENCOUNTER — Encounter: Payer: Self-pay | Admitting: Nurse Practitioner

## 2019-04-19 ENCOUNTER — Encounter: Payer: Self-pay | Admitting: Obstetrics and Gynecology

## 2019-04-19 ENCOUNTER — Ambulatory Visit: Payer: Medicare HMO | Admitting: Obstetrics and Gynecology

## 2019-04-19 ENCOUNTER — Other Ambulatory Visit: Payer: Self-pay

## 2019-04-19 ENCOUNTER — Other Ambulatory Visit (HOSPITAL_COMMUNITY)
Admission: RE | Admit: 2019-04-19 | Discharge: 2019-04-19 | Disposition: A | Discharge status: Home / Self Care | Payer: Medicare HMO | Source: Ambulatory Visit | Attending: Obstetrics and Gynecology | Admitting: Obstetrics and Gynecology

## 2019-04-19 VITALS — BP 157/86 | HR 106 | Ht 63.0 in | Wt 172.0 lb

## 2019-04-19 DIAGNOSIS — R109 Unspecified abdominal pain: Secondary | ICD-10-CM | POA: Diagnosis not present

## 2019-04-19 DIAGNOSIS — Z780 Asymptomatic menopausal state: Secondary | ICD-10-CM | POA: Insufficient documentation

## 2019-04-19 NOTE — Progress Notes (Signed)
84 yo P4 postmenopausal referred for the evaluation of abdominal pain. Patient reports pain has been present since July 2020. She describes the pain as a squeezing pain, as if "something is trying to squeeze my organs out". She reports some improvement of her pain with the use of dulcolax. She states that her pain occurs spontaneously and is improved with walking. Bowel moves sometimes alleviate the pain. Her pain is at times in the lower abdomen but often in left upper quadrant or left side of the abdomen. Pain does not seem to be related to food intake. She is not sexually active and denies any vaginal bleeding. Patient is postmenopausal. Recent CT scan demonstrated a small fibroid uterus  Past Medical History:  Diagnosis Date  . Allergy    "my nose runs alot"  . Anxiety   . Arthritis   . Atrial fibrillation ([place])   . Blood transfusion without reported diagnosis    "when I had a miscarrage in 1957"  . Cataract    bilateral removed  . Cholelithiasis   . Depression   . Diabetes mellitus   . Dyslipidemia   . HTN (hypertension)   . Hx of long-term (current) use of anticoagulants   . Hyperlipidemia   . Hypertension   . Hypothyroidism   . Lung cancer ([place])   . Obesity   . Osteopenia   . Vitamin D deficiency    Past Surgical History:  Procedure Laterality Date  . CATARACT EXTRACTION    . COLONOSCOPY    . LUNG CANCER SURGERY  1999  . LUNG REMOVAL, PARTIAL  1998   Right  . THYROIDECTOMY    . TUBAL LIGATION     Family History  Problem Relation Age of Onset  . Coronary artery disease Brother 44  . Hypertension Brother   . Heart disease Brother   . Hypertension Mother   . Hypertension Father   . Heart disease Father   . Hypertension Sister   . Heart disease Sister   . Arthritis Daughter   . COPD Daughter   . Fibromyalgia Daughter   . Ulcerative colitis Son   . Stroke Maternal Grandmother   . Cancer Brother   . Hypertension Sister   . Colon cancer Neg Hx   . Food  intolerance Neg Hx   . Esophageal cancer Neg Hx   . Rectal cancer Neg Hx   . Stomach cancer Neg Hx    Social History   Tobacco Use  . Smoking status: Never Smoker  . Smokeless tobacco: Never Used  Substance Use Topics  . Alcohol use: No  . Drug use: No   ROS See pertinent in HPI. All other systems reviewed and negative Blood pressure (!) 157/86, pulse (!) 106, height 5\' 3"  (1.6 m), weight 172 lb (78 kg), last menstrual period 05/11/1992. GENERAL: Well-developed, well-nourished female in no acute distress.  ABDOMEN: Soft, nontender, nondistended. No organomegaly. PELVIC: Normal external female genitalia. Vagina is pale and atrophic.  Normal discharge. Normal appearing cervix, flush with vaginal vault. Uterus is normal in size. No adnexal mass or tenderness. EXTREMITIES: No cyanosis, clubbing, or edema, 2+ distal pulses.  A/P 84 yo with 79-month history of abdominal pain - Pain appears to be in different location, unlikely to be of gynecologic origin. - Reassurance regarding fibroid uterus. No intervention needed - Vaginal swab collected to rule out infection - Urine culture also collected - Advised to keep food diary as it appears to be GI in etiology. Follow up for  colonoscopy as scheduled - Patient will be contacted with abnormal results

## 2019-04-19 NOTE — Telephone Encounter (Signed)
Spoke with daughter Ms. Donneta Romberg, scheduled patient for the following:    04/21/19 at 1:00 pm PV with nurse (refer to Dr. Loletha Carrow' message below concerning anticoag)  05/05/19 at 10:00 am Colonoscopy in [facility]  Daughter confirmed the patients 2nd covid vaccination 2/22. No need for patient to be screening at [facility] pathology.

## 2019-04-19 NOTE — Progress Notes (Signed)
New GYN presents for abdominal pain 8-9/10 for the last 7+ months, weight loss, bloating.

## 2019-04-20 LAB — CERVICOVAGINAL ANCILLARY ONLY
Bacterial Vaginitis (gardnerella): NEGATIVE
Candida Glabrata: NEGATIVE
Candida Vaginitis: NEGATIVE
Comment: NEGATIVE
Comment: NEGATIVE
Comment: NEGATIVE
Comment: NEGATIVE
Trichomonas: NEGATIVE

## 2019-04-21 ENCOUNTER — Other Ambulatory Visit: Payer: Self-pay

## 2019-04-21 ENCOUNTER — Ambulatory Visit (AMBULATORY_SURGERY_CENTER): Payer: Self-pay | Admitting: *Deleted

## 2019-04-21 ENCOUNTER — Ambulatory Visit: Payer: Medicare HMO | Admitting: Nurse Practitioner

## 2019-04-21 VITALS — Temp 98.2°F | Ht 63.0 in | Wt 173.0 lb

## 2019-04-21 DIAGNOSIS — R1012 Left upper quadrant pain: Secondary | ICD-10-CM

## 2019-04-21 DIAGNOSIS — R634 Abnormal weight loss: Secondary | ICD-10-CM

## 2019-04-21 LAB — URINE CULTURE

## 2019-04-21 MED ORDER — SUPREP BOWEL PREP KIT 17.5-3.13-1.6 GM/177ML PO SOLN: 1.0000 | Freq: Once | ORAL | 0 refills | Status: AC

## 2019-04-21 NOTE — Progress Notes (Signed)
Completed covid vacc series 04-11-2019  Daughter Neoma Laming in Pv with pt- 97.3 temp   No egg or soy allergy known to patient  No issues with past sedation with any surgeries  or procedures, no intubation problems  No diet pills per patient No home 02 use per patient  No blood thinners per patient  Pt denies issues with constipation - uses Miralax daily and dulcolax twice a week- some diarrhea with diarrhea  No A fib or A flutter  EMMI video sent to pt's e mail   Due to the COVID-19 pandemic we are asking patients to follow these guidelines. Please only bring one care partner. Please be aware that your care partner may wait in the car in the parking lot or if they feel like they will be too hot to wait in the car, they may wait in the lobby on the 4th floor. All care partners are required to wear a mask the entire time (we do not have any that we can provide them), they need to practice social distancing, and we will do a Covid check for all patient's and care partners when you arrive. Also we will check their temperature and your temperature. If the care partner waits in their car they need to stay in the parking lot the entire time and we will call them on their cell phone when the patient is ready for discharge so they can bring the car to the front of the building. Also all patient's will need to wear a mask into building.

## 2019-04-28 ENCOUNTER — Other Ambulatory Visit: Payer: Self-pay

## 2019-04-29 ENCOUNTER — Ambulatory Visit (INDEPENDENT_AMBULATORY_CARE_PROVIDER_SITE_OTHER): Payer: Medicare HMO | Admitting: Nurse Practitioner

## 2019-04-29 ENCOUNTER — Encounter: Payer: Self-pay | Admitting: Nurse Practitioner

## 2019-04-29 ENCOUNTER — Telehealth: Payer: Self-pay | Admitting: Nurse Practitioner

## 2019-04-29 VITALS — BP 147/84 | HR 91 | Temp 97.0°F | Ht 63.0 in | Wt 172.2 lb

## 2019-04-29 DIAGNOSIS — E038 Other specified hypothyroidism: Secondary | ICD-10-CM | POA: Diagnosis not present

## 2019-04-29 DIAGNOSIS — F411 Generalized anxiety disorder: Secondary | ICD-10-CM

## 2019-04-29 DIAGNOSIS — I10 Essential (primary) hypertension: Secondary | ICD-10-CM | POA: Diagnosis not present

## 2019-04-29 DIAGNOSIS — E119 Type 2 diabetes mellitus without complications: Secondary | ICD-10-CM | POA: Diagnosis not present

## 2019-04-29 DIAGNOSIS — R609 Edema, unspecified: Secondary | ICD-10-CM

## 2019-04-29 DIAGNOSIS — E876 Hypokalemia: Secondary | ICD-10-CM | POA: Diagnosis not present

## 2019-04-29 DIAGNOSIS — R69 Illness, unspecified: Secondary | ICD-10-CM | POA: Diagnosis not present

## 2019-04-29 DIAGNOSIS — I4891 Unspecified atrial fibrillation: Secondary | ICD-10-CM | POA: Diagnosis not present

## 2019-04-29 DIAGNOSIS — E782 Mixed hyperlipidemia: Secondary | ICD-10-CM | POA: Diagnosis not present

## 2019-04-29 DIAGNOSIS — Z6834 Body mass index (BMI) 34.0-34.9, adult: Secondary | ICD-10-CM

## 2019-04-29 DIAGNOSIS — F3341 Major depressive disorder, recurrent, in partial remission: Secondary | ICD-10-CM

## 2019-04-29 LAB — BAYER DCA HB A1C WAIVED: HB A1C (BAYER DCA - WAIVED): 6.1 % (ref <7.0)

## 2019-04-29 MED ORDER — DILTIAZEM HCL ER COATED BEADS 360 MG PO CP24: 360.0000 mg | ORAL_CAPSULE | Freq: Every day | ORAL | 3 refills | Status: DC

## 2019-04-29 MED ORDER — METOPROLOL SUCCINATE ER 200 MG PO TB24: ORAL_TABLET | ORAL | 1 refills | Status: DC

## 2019-04-29 MED ORDER — LEVOTHYROXINE SODIUM 75 MCG PO TABS: 75.0000 ug | ORAL_TABLET | Freq: Every day | ORAL | 1 refills | Status: DC

## 2019-04-29 MED ORDER — FUROSEMIDE 20 MG PO TABS: 10.0000 mg | ORAL_TABLET | Freq: Every day | ORAL | 1 refills | Status: DC | PRN

## 2019-04-29 MED ORDER — CLONAZEPAM 0.5 MG PO TABS: 0.5000 mg | ORAL_TABLET | Freq: Two times a day (BID) | ORAL | 3 refills | Status: DC | PRN

## 2019-04-29 MED ORDER — FUROSEMIDE 20 MG PO TABS: ORAL_TABLET | ORAL | 1 refills | Status: DC

## 2019-04-29 MED ORDER — METFORMIN HCL 500 MG PO TABS: 500.0000 mg | ORAL_TABLET | Freq: Every day | ORAL | 1 refills | Status: DC

## 2019-04-29 MED ORDER — APIXABAN 5 MG PO TABS: 5.0000 mg | ORAL_TABLET | Freq: Two times a day (BID) | ORAL | 1 refills | Status: DC

## 2019-04-29 NOTE — Patient Instructions (Signed)

## 2019-04-29 NOTE — Telephone Encounter (Signed)
[facility] in [place] called to confirm which directions to use on Furosemide Rx for patient.

## 2019-04-29 NOTE — Progress Notes (Signed)
Subjective:    Patient ID: Veronica Paul, female    DOB: 12-31-1935, 84 y.o.   MRN: [id number]   Chief Complaint: medical management of chronic issues   HPI:  1. Essential hypertension No c/o chest pain, sob or headache. Does not check blood pressure at home. BP Readings from Last 3 Encounters:  04/19/19 (!) 157/86  03/17/19 (!) 151/81  01/27/19 136/88     2. Atrial fibrillation with RVR (HCC) No episoes of heart racing  3. Other specified hypothyroidism No problems that aware of.  4. Diabetes mellitus without complication (HCC) Fasting blood sugars are running around 100. She denies any real low blood sugars. Lab Results  Component Value Date   HGBA1C 6.1 01/27/2019     5. Recurrent major depressive disorder, in partial remission ([place]) On no prescription meds. Says she is well. Depression screen [facility] 2/9 04/29/2019 01/27/2019 10/22/2018  Decreased Interest 0 0 0  Down, Depressed, Hopeless 0 0 0  PHQ - 2 Score 0 0 0  Altered sleeping 0 - -  Tired, decreased energy 2 - -  Change in appetite 0 - -  Feeling bad or failure about yourself  0 - -  Trouble concentrating 0 - -  Moving slowly or fidgety/restless 0 - -  Suicidal thoughts 0 - -  PHQ-9 Score 2 - -  Difficult doing work/chores Not difficult at all - -  Some recent data might be hidden     6. GAD (generalized anxiety disorder) She is on klonopin BID and needs refill. Says she has been worrying a lot since her pain started in her abdomen. GAD 7 : Generalized Anxiety Score 10/22/2018 11/05/2017 10/02/2016  Nervous, Anxious, on Edge 1 1 0  Control/stop worrying 0 3 0  Worry too much - different things 0 3 0  Trouble relaxing 0 0 0  Restless 0 0 0  Easily annoyed or irritable 0 0 0  Afraid - awful might happen 0 0 0  Total GAD 7 Score 1 7 0  Anxiety Difficulty Not difficult at all Not difficult at all Not difficult at all      7. Mixed hyperlipidemia Does not watch diet and does little to no  exercise.  8. Hypokalemia No c/o lower ext cramps. Lab Results  Component Value Date   K 3.9 01/27/2019     9. Peripheral edema Has occasional edema of lower ext but not daily.  10. BMI 34.0-34.9 Weight is down 3lbs in month butt overall 20 lbs since pain started. Wt Readings from Last 3 Encounters:  04/29/19 172 lb 3.2 oz (78.1 kg)  04/21/19 173 lb (78.5 kg)  04/19/19 172 lb (78 kg)    Outpatient Encounter Medications as of 04/29/2019  Medication Sig  . apixaban (ELIQUIS) 5 MG TABS tablet Take 1 tablet (5 mg total) by mouth 2 (two) times daily.  . bisacodyl (DULCOLAX) 5 MG EC tablet Take 5 mg by mouth as directed. Twice a week - stool softener  . Blood Glucose Monitoring Suppl (ONETOUCH VERIO) w/Device KIT Check blood sugars daily Dx E11.9  . clonazePAM (KLONOPIN) 0.5 MG tablet Take 1 tablet (0.5 mg total) by mouth 2 (two) times daily as needed.  . diltiazem (CARDIZEM CD) 360 MG 24 hr capsule Take 1 capsule (360 mg total) by mouth daily.  . fish oil-omega-3 fatty acids 1000 MG capsule Take 2 g by mouth daily.    . furosemide (LASIX) 20 MG tablet Take 0.5 tablets (10 mg total)  by mouth daily as needed. Take 1/2 tablet as needed 3 times weekly  . glucose blood (ONETOUCH VERIO) test strip Check blood sugars daily Dx E11.9  . Lancets (ONETOUCH ULTRASOFT) lancets Check blood sugars daily Dx E11.9  . levothyroxine (SYNTHROID) 75 MCG tablet Take 1 tablet (75 mcg total) by mouth daily.  . metFORMIN (GLUCOPHAGE) 500 MG tablet Take 1 tablet (500 mg total) by mouth daily.  . metoprolol (TOPROL-XL) 200 MG 24 hr tablet TAKE 1 AND 1/2 TABLET DAILY  . nitroGLYCERIN (NITROSTAT) 0.4 MG SL tablet PLACE 1 TABLET (0.4 MG TOTAL) UNDER THE TONGUE EVERY 5 (FIVE) MINUTES AS NEEDED FOR CHEST PAIN.  Marland Kitchen pantoprazole (PROTONIX) 40 MG tablet Take 1 tablet (40 mg total) by mouth daily. (Patient not taking: Reported on 04/21/2019)  . polyethylene glycol (MIRALAX / GLYCOLAX) 17 g packet Take 17 g by mouth daily as  needed.     Past Surgical History:  Procedure Laterality Date  . CATARACT EXTRACTION Bilateral   . COLONOSCOPY    . LUNG REMOVAL, PARTIAL  1998   Right  . THYROIDECTOMY    . TUBAL LIGATION    . UPPER GASTROINTESTINAL ENDOSCOPY      Family History  Problem Relation Age of Onset  . Coronary artery disease Brother 32  . Hypertension Brother   . Heart disease Brother   . Hypertension Mother   . Hypertension Father   . Heart disease Father   . Hypertension Sister   . Heart disease Sister   . Cancer Sister   . Arthritis Daughter   . COPD Daughter   . Fibromyalgia Daughter   . Ulcerative colitis Son   . Stroke Maternal Grandmother   . Cancer Brother   . Lung cancer Brother   . Hypertension Sister   . Cancer Sister   . Colon cancer Neg Hx   . Food intolerance Neg Hx   . Esophageal cancer Neg Hx   . Rectal cancer Neg Hx   . Stomach cancer Neg Hx   . Colon polyps Neg Hx     New complaints: -Patient is still having lower abdominal pain. Sometimes she says it is left upper quadrant pain. She describes it as a squeezing type of pain, that occurs intermittently and daily lasting at least an hour everytime. This has been going on since June of 2020. Having a bowel movement will sometimes reieve her pain. She has lost weight since this started. We has done ultra sounds and CT of abdomen which have all been negative. The only thing we have found is uterine fibroids. I have sent message to her GI stating that I have referred her to a GYN and if he can think of anything else we could do. She actually saw GYN on 04/19/19, and as I thought they did not feel that pain was coming from fibroids. Looks like she has colonoscopy scheduled for 05/05/19. She has been keeping a food diary and sees no correlation to pain and foods she is eating. She says pain is still some better when she has a bowel movement. - her daughter says that her speech has change since 10/2018. She has trouble saying what she  needs to say. That has not changed since it started.  Social history: Lives by herself but daughter checks on her daily.  Controlled substance contract: 10/26/18    Review of Systems  Constitutional: Negative for diaphoresis.  Eyes: Negative for pain.  Respiratory: Negative for shortness of breath.   Cardiovascular:  Negative for chest pain, palpitations and leg swelling.  Gastrointestinal: Negative for abdominal pain.  Endocrine: Negative for polydipsia.  Skin: Negative for rash.  Neurological: Negative for dizziness, weakness and headaches.  Hematological: Does not bruise/bleed easily.  All other systems reviewed and are negative.      Objective:   Physical Exam Vitals and nursing note reviewed.  Constitutional:      General: She is not in acute distress.    Appearance: Normal appearance. She is well-developed.  HENT:     Head: Normocephalic.     Nose: Nose normal.  Eyes:     Pupils: Pupils are equal, round, and reactive to light.  Neck:     Vascular: No carotid bruit or JVD.  Cardiovascular:     Rate and Rhythm: Normal rate. Rhythm irregular.     Heart sounds: Normal heart sounds.  Pulmonary:     Effort: Pulmonary effort is normal. No respiratory distress.     Breath sounds: Normal breath sounds. No wheezing or rales.  Chest:     Chest wall: No tenderness.  Abdominal:     General: Bowel sounds are normal. There is no distension or abdominal bruit.     Palpations: Abdomen is soft. There is no hepatomegaly, splenomegaly, mass or pulsatile mass.     Tenderness: There is abdominal tenderness (mainly LUQ).  Musculoskeletal:        General: Normal range of motion.     Cervical back: Normal range of motion and neck supple.  Lymphadenopathy:     Cervical: No cervical adenopathy.  Skin:    General: Skin is warm and dry.  Neurological:     Mental Status: She is alert and oriented to person, place, and time.     Deep Tendon Reflexes: Reflexes are normal and symmetric.   Psychiatric:        Behavior: Behavior normal.        Thought Content: Thought content normal.        Judgment: Judgment normal.     BP (!) 146/85   Pulse 91   Temp (!) 97 F (36.1 C) (Temporal)   Ht '5\' 3"'  (1.6 m)   Wt 172 lb 3.2 oz (78.1 kg)   LMP 05/11/1992   SpO2 98%   BMI 30.50 kg/m   HGBA1c 6.1%     Assessment & Plan:  Veronica Paul comes in today with chief complaint of Medical Management of Chronic Issues (3 month )   Diagnosis and orders addressed:  1. Essential hypertension low sodium diet - CMP14+EGFR - CBC with Differential/Platelet - diltiazem (CARDIZEM CD) 360 MG 24 hr capsule; Take 1 capsule (360 mg total) by mouth daily.  Dispense: 90 capsule; Refill: 3 - metoprolol (TOPROL-XL) 200 MG 24 hr tablet; TAKE 1 AND 1/2 TABLET DAILY  Dispense: 135 tablet; Refill: 1  2. Atrial fibrillation with RVR (HCC) Avoid caffeine  3. Other specified hypothyroidism - Thyroid Panel With TSH - levothyroxine (SYNTHROID) 75 MCG tablet; Take 1 tablet (75 mcg total) by mouth daily.  Dispense: 90 tablet; Refill: 1  4. Diabetes mellitus without complication ([place]) Continue to watch carbs in diet - Bayer DCA Hb A1c Waived - metFORMIN (GLUCOPHAGE) 500 MG tablet; Take 1 tablet (500 mg total) by mouth daily.  Dispense: 90 tablet; Refill: 1  5. Recurrent major depressive disorder, in partial remission ([place]) Stress management  6. GAD (generalized anxiety disorder) - clonazePAM (KLONOPIN) 0.5 MG tablet; Take 1 tablet (0.5 mg total) by mouth 2 (two)  times daily as needed.  Dispense: 60 tablet; Refill: 3  7. Mixed hyperlipidemia Low fat diet - CMP14+EGFR - Lipid panel  8. Hypokalemia Labs pending  9. Peripheral edema Elevate legs when sitting  10. BMI 34.0-34.9,adult Discussed diet and exercise for person with BMI >25 Will recheck weight in 3-6 months    Labs pending Health Maintenance reviewed Diet and exercise encouraged  Follow up plan: 3  months   [name], FNP

## 2019-04-30 LAB — CBC WITH DIFFERENTIAL/PLATELET
Basophils Absolute: 0.1 10*3/uL (ref 0.0–0.2)
Basos: 1 %
EOS (ABSOLUTE): 0.2 10*3/uL (ref 0.0–0.4)
Eos: 2 %
Hematocrit: 43.3 % (ref 34.0–46.6)
Hemoglobin: 14.4 g/dL (ref 11.1–15.9)
Immature Grans (Abs): 0 10*3/uL (ref 0.0–0.1)
Immature Granulocytes: 0 %
Lymphocytes Absolute: 2.3 10*3/uL (ref 0.7–3.1)
Lymphs: 30 %
MCH: 29.6 pg (ref 26.6–33.0)
MCHC: 33.3 g/dL (ref 31.5–35.7)
MCV: 89 fL (ref 79–97)
Monocytes Absolute: 0.6 10*3/uL (ref 0.1–0.9)
Monocytes: 8 %
Neutrophils Absolute: 4.6 10*3/uL (ref 1.4–7.0)
Neutrophils: 59 %
Platelets: 308 10*3/uL (ref 150–450)
RBC: 4.87 x10E6/uL (ref 3.77–5.28)
RDW: 14.1 % (ref 11.7–15.4)
WBC: 7.8 10*3/uL (ref 3.4–10.8)

## 2019-04-30 LAB — LIPID PANEL
Chol/HDL Ratio: 4.3 ratio (ref 0.0–4.4)
Cholesterol, Total: 234 mg/dL — ABNORMAL HIGH (ref 100–199)
HDL: 54 mg/dL (ref >39)
LDL Chol Calc (NIH): 159 mg/dL — ABNORMAL HIGH (ref 0–99)
Triglycerides: 119 mg/dL (ref 0–149)
VLDL Cholesterol Cal: 21 mg/dL (ref 5–40)

## 2019-04-30 LAB — CMP14+EGFR
ALT: 14 IU/L (ref 0–32)
AST: 17 IU/L (ref 0–40)
Albumin/Globulin Ratio: 1.8 (ref 1.2–2.2)
Albumin: 4.5 g/dL (ref 3.6–4.6)
Alkaline Phosphatase: 67 IU/L (ref 39–117)
BUN/Creatinine Ratio: 11 — ABNORMAL LOW (ref 12–28)
BUN: 11 mg/dL (ref 8–27)
Bilirubin Total: 0.6 mg/dL (ref 0.0–1.2)
CO2: 24 mmol/L (ref 20–29)
Calcium: 9.2 mg/dL (ref 8.7–10.3)
Chloride: 102 mmol/L (ref 96–106)
Creatinine, Ser: 0.99 mg/dL (ref 0.57–1.00)
GFR calc Af Amer: 62 mL/min/{1.73_m2} (ref >59)
GFR calc non Af Amer: 54 mL/min/{1.73_m2} — ABNORMAL LOW (ref >59)
Globulin, Total: 2.5 g/dL (ref 1.5–4.5)
Glucose: 104 mg/dL — ABNORMAL HIGH (ref 65–99)
Potassium: 4.2 mmol/L (ref 3.5–5.2)
Sodium: 143 mmol/L (ref 134–144)
Total Protein: 7 g/dL (ref 6.0–8.5)

## 2019-04-30 LAB — THYROID PANEL WITH TSH
Free Thyroxine Index: 3.2 (ref 1.2–4.9)
T3 Uptake Ratio: 35 % (ref 24–39)
T4, Total: 9 ug/dL (ref 4.5–12.0)
TSH: 1.35 u[IU]/mL (ref 0.450–4.500)

## 2019-05-02 ENCOUNTER — Encounter: Payer: Self-pay | Admitting: Gastroenterology

## 2019-05-05 ENCOUNTER — Encounter (HOSPITAL_COMMUNITY): Payer: Self-pay | Admitting: *Deleted

## 2019-05-05 ENCOUNTER — Other Ambulatory Visit: Payer: Self-pay

## 2019-05-05 ENCOUNTER — Ambulatory Visit: Payer: Medicare HMO | Admitting: Gastroenterology

## 2019-05-05 ENCOUNTER — Encounter: Payer: Self-pay | Admitting: Gastroenterology

## 2019-05-05 ENCOUNTER — Observation Stay (HOSPITAL_COMMUNITY)
Admission: EM | Admit: 2019-05-05 | Discharge: 2019-05-08 | Disposition: A | Discharge status: Home / Self Care | Payer: Medicare HMO | Attending: Family Medicine | Admitting: Family Medicine

## 2019-05-05 ENCOUNTER — Emergency Department (HOSPITAL_COMMUNITY): Payer: Medicare HMO

## 2019-05-05 VITALS — BP 141/83 | HR 147 | Temp 96.6°F | Ht 63.0 in | Wt 173.0 lb

## 2019-05-05 DIAGNOSIS — I10 Essential (primary) hypertension: Secondary | ICD-10-CM | POA: Insufficient documentation

## 2019-05-05 DIAGNOSIS — R109 Unspecified abdominal pain: Secondary | ICD-10-CM | POA: Diagnosis present

## 2019-05-05 DIAGNOSIS — E038 Other specified hypothyroidism: Secondary | ICD-10-CM

## 2019-05-05 DIAGNOSIS — E890 Postprocedural hypothyroidism: Secondary | ICD-10-CM | POA: Insufficient documentation

## 2019-05-05 DIAGNOSIS — R531 Weakness: Secondary | ICD-10-CM | POA: Diagnosis not present

## 2019-05-05 DIAGNOSIS — D122 Benign neoplasm of ascending colon: Secondary | ICD-10-CM

## 2019-05-05 DIAGNOSIS — E669 Obesity, unspecified: Secondary | ICD-10-CM | POA: Insufficient documentation

## 2019-05-05 DIAGNOSIS — Z79899 Other long term (current) drug therapy: Secondary | ICD-10-CM | POA: Insufficient documentation

## 2019-05-05 DIAGNOSIS — E039 Hypothyroidism, unspecified: Secondary | ICD-10-CM | POA: Diagnosis present

## 2019-05-05 DIAGNOSIS — R634 Abnormal weight loss: Secondary | ICD-10-CM | POA: Insufficient documentation

## 2019-05-05 DIAGNOSIS — D125 Benign neoplasm of sigmoid colon: Secondary | ICD-10-CM | POA: Insufficient documentation

## 2019-05-05 DIAGNOSIS — K5730 Diverticulosis of large intestine without perforation or abscess without bleeding: Secondary | ICD-10-CM | POA: Insufficient documentation

## 2019-05-05 DIAGNOSIS — Z7901 Long term (current) use of anticoagulants: Secondary | ICD-10-CM | POA: Diagnosis not present

## 2019-05-05 DIAGNOSIS — K648 Other hemorrhoids: Secondary | ICD-10-CM | POA: Diagnosis not present

## 2019-05-05 DIAGNOSIS — M1990 Unspecified osteoarthritis, unspecified site: Secondary | ICD-10-CM | POA: Insufficient documentation

## 2019-05-05 DIAGNOSIS — Z882 Allergy status to sulfonamides status: Secondary | ICD-10-CM | POA: Insufficient documentation

## 2019-05-05 DIAGNOSIS — Z20822 Contact with and (suspected) exposure to covid-19: Secondary | ICD-10-CM | POA: Diagnosis not present

## 2019-05-05 DIAGNOSIS — D120 Benign neoplasm of cecum: Secondary | ICD-10-CM

## 2019-05-05 DIAGNOSIS — Z85118 Personal history of other malignant neoplasm of bronchus and lung: Secondary | ICD-10-CM | POA: Diagnosis not present

## 2019-05-05 DIAGNOSIS — K635 Polyp of colon: Secondary | ICD-10-CM | POA: Diagnosis not present

## 2019-05-05 DIAGNOSIS — D123 Benign neoplasm of transverse colon: Secondary | ICD-10-CM

## 2019-05-05 DIAGNOSIS — R1030 Lower abdominal pain, unspecified: Secondary | ICD-10-CM | POA: Diagnosis not present

## 2019-05-05 DIAGNOSIS — D124 Benign neoplasm of descending colon: Secondary | ICD-10-CM | POA: Diagnosis not present

## 2019-05-05 DIAGNOSIS — Z79890 Hormone replacement therapy (postmenopausal): Secondary | ICD-10-CM | POA: Insufficient documentation

## 2019-05-05 DIAGNOSIS — Z6834 Body mass index (BMI) 34.0-34.9, adult: Secondary | ICD-10-CM | POA: Diagnosis not present

## 2019-05-05 DIAGNOSIS — Z881 Allergy status to other antibiotic agents status: Secondary | ICD-10-CM | POA: Insufficient documentation

## 2019-05-05 DIAGNOSIS — E785 Hyperlipidemia, unspecified: Secondary | ICD-10-CM | POA: Insufficient documentation

## 2019-05-05 DIAGNOSIS — R339 Retention of urine, unspecified: Secondary | ICD-10-CM | POA: Insufficient documentation

## 2019-05-05 DIAGNOSIS — R002 Palpitations: Secondary | ICD-10-CM | POA: Diagnosis not present

## 2019-05-05 DIAGNOSIS — I517 Cardiomegaly: Secondary | ICD-10-CM | POA: Diagnosis not present

## 2019-05-05 DIAGNOSIS — Z8249 Family history of ischemic heart disease and other diseases of the circulatory system: Secondary | ICD-10-CM | POA: Insufficient documentation

## 2019-05-05 DIAGNOSIS — Z888 Allergy status to other drugs, medicaments and biological substances status: Secondary | ICD-10-CM | POA: Insufficient documentation

## 2019-05-05 DIAGNOSIS — E119 Type 2 diabetes mellitus without complications: Secondary | ICD-10-CM | POA: Insufficient documentation

## 2019-05-05 DIAGNOSIS — B356 Tinea cruris: Secondary | ICD-10-CM | POA: Diagnosis not present

## 2019-05-05 DIAGNOSIS — I4891 Unspecified atrial fibrillation: Secondary | ICD-10-CM | POA: Diagnosis not present

## 2019-05-05 DIAGNOSIS — Z7984 Long term (current) use of oral hypoglycemic drugs: Secondary | ICD-10-CM | POA: Insufficient documentation

## 2019-05-05 DIAGNOSIS — R5383 Other fatigue: Secondary | ICD-10-CM | POA: Diagnosis not present

## 2019-05-05 DIAGNOSIS — I4821 Permanent atrial fibrillation: Secondary | ICD-10-CM | POA: Diagnosis present

## 2019-05-05 LAB — CBC WITH DIFFERENTIAL/PLATELET
Abs Immature Granulocytes: 0.03 10*3/uL (ref 0.00–0.07)
Basophils Absolute: 0.1 10*3/uL (ref 0.0–0.1)
Basophils Relative: 1 %
Eosinophils Absolute: 0 10*3/uL (ref 0.0–0.5)
Eosinophils Relative: 0 %
HCT: 45.9 % (ref 36.0–46.0)
Hemoglobin: 14.5 g/dL (ref 12.0–15.0)
Immature Granulocytes: 0 %
Lymphocytes Relative: 18 %
Lymphs Abs: 1.9 10*3/uL (ref 0.7–4.0)
MCH: 29.4 pg (ref 26.0–34.0)
MCHC: 31.6 g/dL (ref 30.0–36.0)
MCV: 92.9 fL (ref 80.0–100.0)
Monocytes Absolute: 0.7 10*3/uL (ref 0.1–1.0)
Monocytes Relative: 6 %
Neutro Abs: 8.1 10*3/uL — ABNORMAL HIGH (ref 1.7–7.7)
Neutrophils Relative %: 75 %
Platelets: 251 10*3/uL (ref 150–400)
RBC: 4.94 MIL/uL (ref 3.87–5.11)
RDW: 14.4 % (ref 11.5–15.5)
WBC: 10.7 10*3/uL — ABNORMAL HIGH (ref 4.0–10.5)
nRBC: 0 % (ref 0.0–0.2)

## 2019-05-05 LAB — COMPREHENSIVE METABOLIC PANEL
ALT: 19 U/L (ref 0–44)
AST: 20 U/L (ref 15–41)
Albumin: 4.2 g/dL (ref 3.5–5.0)
Alkaline Phosphatase: 55 U/L (ref 38–126)
Anion gap: 14 (ref 5–15)
BUN: 8 mg/dL (ref 8–23)
CO2: 24 mmol/L (ref 22–32)
Calcium: 8.7 mg/dL — ABNORMAL LOW (ref 8.9–10.3)
Chloride: 106 mmol/L (ref 98–111)
Creatinine, Ser: 0.89 mg/dL (ref 0.44–1.00)
GFR calc Af Amer: >60 mL/min (ref >60)
GFR calc non Af Amer: >60 mL/min (ref >60)
Glucose, Bld: 124 mg/dL — ABNORMAL HIGH (ref 70–99)
Potassium: 3.5 mmol/L (ref 3.5–5.1)
Sodium: 144 mmol/L (ref 135–145)
Total Bilirubin: 0.7 mg/dL (ref 0.3–1.2)
Total Protein: 7.5 g/dL (ref 6.5–8.1)

## 2019-05-05 LAB — T4, FREE: Free T4: 1.28 ng/dL — ABNORMAL HIGH (ref 0.61–1.12)

## 2019-05-05 LAB — BRAIN NATRIURETIC PEPTIDE: B Natriuretic Peptide: 690.7 pg/mL — ABNORMAL HIGH (ref 0.0–100.0)

## 2019-05-05 LAB — TROPONIN I (HIGH SENSITIVITY)
Troponin I (High Sensitivity): 10 ng/L (ref <18)
Troponin I (High Sensitivity): 12 ng/L (ref <18)

## 2019-05-05 LAB — CBG MONITORING, ED
Glucose-Capillary: 73 mg/dL (ref 70–99)
Glucose-Capillary: 87 mg/dL (ref 70–99)

## 2019-05-05 LAB — TSH: TSH: 0.645 u[IU]/mL (ref 0.350–4.500)

## 2019-05-05 MED ORDER — ENOXAPARIN SODIUM 40 MG/0.4ML ~~LOC~~ SOLN: 40.0000 mg | SUBCUTANEOUS | Status: DC

## 2019-05-05 MED ORDER — DILTIAZEM LOAD VIA INFUSION
10.0000 mg | Freq: Once | INTRAVENOUS | Status: AC
  Administered 2019-05-05: 10 mg via INTRAVENOUS
  Filled 2019-05-05: qty 10

## 2019-05-05 MED ORDER — DILTIAZEM HCL ER COATED BEADS 180 MG PO CP24
360.0000 mg | ORAL_CAPSULE | Freq: Every day | ORAL | Status: DC
  Administered 2019-05-06: 10:00:00 360 mg via ORAL
  Filled 2019-05-05 (×2): qty 2

## 2019-05-05 MED ORDER — SODIUM CHLORIDE 0.9 % IV BOLUS
500.0000 mL | Freq: Once | INTRAVENOUS | Status: AC
  Administered 2019-05-05: 500 mL via INTRAVENOUS

## 2019-05-05 MED ORDER — INSULIN ASPART 100 UNIT/ML ~~LOC~~ SOLN
0.0000 [IU] | Freq: Three times a day (TID) | SUBCUTANEOUS | Status: DC
  Administered 2019-05-06: 2 [IU] via SUBCUTANEOUS
  Filled 2019-05-05: qty 0.15

## 2019-05-05 MED ORDER — METOPROLOL SUCCINATE ER 100 MG PO TB24
300.0000 mg | ORAL_TABLET | Freq: Every day | ORAL | Status: DC
  Administered 2019-05-06 – 2019-05-08 (×3): 300 mg via ORAL
  Filled 2019-05-05 (×3): qty 3

## 2019-05-05 MED ORDER — PEG 3350-KCL-NABCB-NACL-NASULF 236 G PO SOLR
2000.0000 mL | Freq: Once | ORAL | Status: AC
  Administered 2019-05-05: 2000 mL via ORAL
  Filled 2019-05-05: qty 4000

## 2019-05-05 MED ORDER — DILTIAZEM HCL-DEXTROSE 125-5 MG/125ML-% IV SOLN (PREMIX)
5.0000 mg/h | INTRAVENOUS | Status: DC
  Administered 2019-05-05 (×2): 5 mg/h via INTRAVENOUS
  Administered 2019-05-06: 07:00:00 15 mg/h via INTRAVENOUS
  Filled 2019-05-05 (×3): qty 125

## 2019-05-05 MED ORDER — LEVOTHYROXINE SODIUM 50 MCG PO TABS
75.0000 ug | ORAL_TABLET | Freq: Every day | ORAL | Status: DC
  Administered 2019-05-06 – 2019-05-08 (×3): 75 ug via ORAL
  Filled 2019-05-05 (×3): qty 1

## 2019-05-05 MED ORDER — INSULIN ASPART 100 UNIT/ML ~~LOC~~ SOLN
0.0000 [IU] | Freq: Every day | SUBCUTANEOUS | Status: DC
  Filled 2019-05-05: qty 0.05

## 2019-05-05 MED ORDER — SODIUM CHLORIDE 0.9 % IV SOLN: 500.0000 mL | Freq: Once | INTRAVENOUS | Status: DC

## 2019-05-05 NOTE — Progress Notes (Signed)
Patient here for colonoscopy for lower abdominal pain. Abdominal exam soft, non-tender. In rapid A fib in preop- esmolol given by anesthesia only brought pulse from 170 to 130s.  No chest pain or dyspnea.  BP 130s/70s before and after esmolol.  I asked nursing staff to communicate with Dr. Percival Spanish for advice, which was to send patient to ED.  Nursing spoke with [facility] ED triage nurse.  I updated patient and daughter.

## 2019-05-05 NOTE — H&P (Addendum)
History and Physical    Veronica Paul  AQT:[id number]  DOB: Apr 09, 1935  DOA: 05/05/2019 PCP: Chevis Pretty, FNP   Patient coming from: Home  Chief Complaint: Sent for atrial fibrillation  HPI: Veronica Paul is a 84 y.o. female with medical history of atrial fibrillation on Eliquis, diabetes mellitus, hypertension, lung cancer who was about to undergo a colonoscopy today when it was discovered that she was in atrial fibrillation with RVR and thus she was sent to the hospital.  The patient states that she has been asymptomatic from this.  She has not had any chest pain or dyspnea at rest or with exertion.  No symptoms of dizziness or feeling lightheaded. She is on Cardizem and metoprolol according to her med rec.  She states that she was unable to take her Cardizem this morning as she typically needs to take it with food but could not have food today.  She did take her metoprolol last night which is typically when she takes it. She admits to suffering from abdominal pain for many months now which is why she was due to undergo a colonoscopy today.   ED Course: She was started on a Cardizem infusion  Review of Systems:  All other systems reviewed and apart from HPI, are negative.  Past Medical History:  Diagnosis Date  . Allergy    "my nose runs alot"  . Anxiety   . Arthritis   . Atrial fibrillation ([place])   . Blood transfusion without reported diagnosis    "when I had a miscarrage in 1957"  . Cataract    bilateral removed  . Cholelithiasis   . Constipation    x ray showed increased stool in colon- uses Miralax daily and Dulcolax twice a week   . Depression   . Diabetes mellitus   . Dyslipidemia   . HTN (hypertension)   . Hx of long-term (current) use of anticoagulants   . Hyperlipidemia   . Hypertension   . Hypothyroidism   . Lung cancer ([place])   . Obesity   . Osteopenia   . Vitamin D deficiency     Past Surgical History:  Procedure Laterality Date  . CATARACT  EXTRACTION Bilateral   . COLONOSCOPY    . LUNG REMOVAL, PARTIAL  1998   Right  . THYROIDECTOMY    . TUBAL LIGATION    . UPPER GASTROINTESTINAL ENDOSCOPY      Social History:   reports that she has never smoked. She has never used smokeless tobacco. She reports that she does not drink alcohol or use drugs.  Allergies  Allergen Reactions  . Ace Inhibitors Cough  . Feldene [Piroxicam]     REACTION: RASH TO HANDS  . Meloxicam Nausea And Vomiting  . Prevnar [Pneumococcal 13-Val Conj Vacc]   . Statins Other (See Comments)    myalgia  . Sulfa Antibiotics     Rash   . Zithromax [Azithromycin Dihydrate] Itching    Family History  Problem Relation Age of Onset  . Coronary artery disease Brother 27  . Hypertension Brother   . Heart disease Brother   . Hypertension Mother   . Hypertension Father   . Heart disease Father   . Hypertension Sister   . Heart disease Sister   . Cancer Sister   . Arthritis Daughter   . COPD Daughter   . Fibromyalgia Daughter   . Ulcerative colitis Son   . Stroke Maternal Grandmother   . Cancer Brother   .  Lung cancer Brother   . Hypertension Sister   . Cancer Sister   . Colon cancer Neg Hx   . Food intolerance Neg Hx   . Esophageal cancer Neg Hx   . Rectal cancer Neg Hx   . Stomach cancer Neg Hx   . Colon polyps Neg Hx      Prior to Admission medications   Medication Sig Start Date End Date Taking? Authorizing Provider  apixaban (ELIQUIS) 5 MG TABS tablet Take 1 tablet (5 mg total) by mouth 2 (two) times daily. 04/29/19   Hassell Done, Mary-Margaret, FNP  bisacodyl (DULCOLAX) 5 MG EC tablet Take 5 mg by mouth as directed. Twice a week - stool softener    [provider]  Blood Glucose Monitoring Suppl (ONETOUCH VERIO) w/Device KIT Check blood sugars daily Dx E11.9 08/24/18   Hassell Done, Mary-Margaret, FNP  clonazePAM (KLONOPIN) 0.5 MG tablet Take 1 tablet (0.5 mg total) by mouth 2 (two) times daily as needed. 04/29/19   Hassell Done, Mary-Margaret, FNP   diltiazem (CARDIZEM CD) 360 MG 24 hr capsule Take 1 capsule (360 mg total) by mouth daily. 04/29/19   Hassell Done Mary-Margaret, FNP  fish oil-omega-3 fatty acids 1000 MG capsule Take 2 g by mouth daily.      [provider]  furosemide (LASIX) 20 MG tablet Take 1/2 tablet as needed 3 times weekly 04/29/19   Chevis Pretty, FNP  glucose blood [facility] VERIO) test strip Check blood sugars daily Dx E11.9 08/24/18   Chevis Pretty, FNP  Lancets [facility] ULTRASOFT) lancets Check blood sugars daily Dx E11.9 08/24/18   Chevis Pretty, FNP  levothyroxine (SYNTHROID) 75 MCG tablet Take 1 tablet (75 mcg total) by mouth daily. 04/29/19   Hassell Done, Mary-Margaret, FNP  metFORMIN (GLUCOPHAGE) 500 MG tablet Take 1 tablet (500 mg total) by mouth daily. 04/29/19   Chevis Pretty, FNP  metoprolol (TOPROL-XL) 200 MG 24 hr tablet TAKE 1 AND 1/2 TABLET DAILY 04/29/19   Hassell Done, Mary-Margaret, FNP  nitroGLYCERIN (NITROSTAT) 0.4 MG SL tablet PLACE 1 TABLET (0.4 MG TOTAL) UNDER THE TONGUE EVERY 5 (FIVE) MINUTES AS NEEDED FOR CHEST PAIN. 10/26/18   Hassell Done, Mary-Margaret, FNP  polyethylene glycol (MIRALAX / GLYCOLAX) 17 g packet Take 17 g by mouth daily as needed.    [provider]    Physical Exam: Wt Readings from Last 3 Encounters:  05/05/19 78.5 kg  04/29/19 78.1 kg  04/21/19 78.5 kg   Vitals:   05/05/19 1430 05/05/19 1445 05/05/19 1500 05/05/19 1515  BP: 127/76 126/70 123/71 126/67  Pulse: 87 76 65 93  Resp: (!) 25 (!) 22 (!) 24 (!) 25  Temp:      TempSrc:      SpO2: 97% 98% 100% 100%      Constitutional:  Calm & comfortable Eyes: PERRLA, lids and conjunctivae normal ENT:  Mucous membranes are moist.  Pharynx clear of exudate   Normal dentition.  Neck: Supple, no masses  Respiratory:  Clear to auscultation bilaterally  Normal respiratory effort.  Cardiovascular:  S1 & S2 heard, irregularly irregular rate and rhythm-heart rate is about 100-110 No  Murmurs Abdomen:  Non distended No tenderness, No masses Bowel sounds normal Extremities:  No clubbing / cyanosis No pedal edema No joint deformity    Skin:  No rashes, lesions or ulcers Neurologic:  AAO x 3 CN 2-12 grossly intact Sensation intact Strength 5/5 in all 4 extremities Psychiatric:  Normal Mood and affect    Labs on Admission: I have personally reviewed  following labs and imaging studies  CBC: Recent Labs  Lab 04/29/19 0853 05/05/19 1220  WBC 7.8 10.7*  NEUTROABS 4.6 8.1*  HGB 14.4 14.5  HCT 43.3 45.9  MCV 89 92.9  PLT 308 488   Basic Metabolic Panel: Recent Labs  Lab 04/29/19 0853 05/05/19 1220  NA 143 144  K 4.2 3.5  CL 102 106  CO2 24 24  GLUCOSE 104* 124*  BUN 11 8  CREATININE 0.99 0.89  CALCIUM 9.2 8.7*   GFR: Estimated Creatinine Clearance: 49.1 mL/min (by C-G formula based on SCr of 0.89 mg/dL). Liver Function Tests: Recent Labs  Lab 04/29/19 0853 05/05/19 1220  AST 17 20  ALT 14 19  ALKPHOS 67 55  BILITOT 0.6 0.7  PROT 7.0 7.5  ALBUMIN 4.5 4.2   No results for input(s): LIPASE, AMYLASE in the last 168 hours. No results for input(s): AMMONIA in the last 168 hours. Coagulation Profile: No results for input(s): INR, PROTIME in the last 168 hours. Cardiac Enzymes: No results for input(s): CKTOTAL, CKMB, CKMBINDEX, TROPONINI in the last 168 hours. BNP (last 3 results) No results for input(s): PROBNP in the last 8760 hours. HbA1C: No results for input(s): HGBA1C in the last 72 hours. CBG: No results for input(s): GLUCAP in the last 168 hours. Lipid Profile: No results for input(s): CHOL, HDL, LDLCALC, TRIG, CHOLHDL, LDLDIRECT in the last 72 hours. Thyroid Function Tests: No results for input(s): TSH, T4TOTAL, FREET4, T3FREE, THYROIDAB in the last 72 hours. Anemia Panel: No results for input(s): VITAMINB12, FOLATE, FERRITIN, TIBC, IRON, RETICCTPCT in the last 72 hours. Urine analysis:    Component Value Date/Time    APPEARANCEUR Clear 09/13/2018 1642   GLUCOSEU Negative 09/13/2018 1642   BILIRUBINUR Negative 09/13/2018 1642   PROTEINUR Trace 09/13/2018 1642   UROBILINOGEN negative 05/11/2012 1019   NITRITE Negative 09/13/2018 1642   LEUKOCYTESUR Trace (A) 09/13/2018 1642   Sepsis Labs: '@LABRCNTIP' (procalcitonin:4,lacticidven:4) )No results found for this or any previous visit (from the past 240 hour(s)).   Radiological Exams on Admission: DG Chest Portable 1 View  Result Date: 05/05/2019 CLINICAL DATA:  Atrial fibrillation EXAM: PORTABLE CHEST 1 VIEW COMPARISON:  04/14/2019 FINDINGS: Mild cardiomegaly, stable. No focal airspace consolidation, pleural effusion, or pneumothorax. No acute osseous findings. IMPRESSION: Stable mild cardiomegaly. No acute cardiopulmonary findings. Electronically Signed   By: Davina Poke D.O.   On: 05/05/2019 12:42    EKG: Independently reviewed.  Atrial fibrillation at 112 bpm  Assessment/Plan Principal Problem:   Atrial fibrillation with RVR --As she is asymptomatic, it is actually difficult to tell how long she has had A. fib with RVR-having to skip her Cardizem dose this morning might have something to do with the rate being fast -I have resumed her home dose of Cardizem along with Toprol-the hope is that we will be able to wean the Cardizem infusion and her rate will remain controlled-if this is not what occurs, we may need to consider digoxin or amiodarone -I will also give her a small fluid bolus as she does take Lasix at home and took a colonoscopy prep last night and may be dehydrated which could have played a part in her rapid ventricular rate -Her thyroid functions have been checked and are normal -Apixaban has been on hold for the colonoscopy  Active Problems:   Hypertension -Continue Cardizem and metoprolol -Hold Lasix    Diabetes mellitus without complication  -Hold Metformin-Place on sliding scale insulin    Hypothyroidism -Continue  Synthroid  Abdominal pain  -GI plans to perform the colonoscopy tomorrow while she is in the hospital as long as the rate is controlled -I have ordered a clear liquid diet for the planned colonoscopy  DVT prophylaxis: Lovenox Code Status: Full code Family Communication: Daughter at bedside Disposition Plan: From home-will likely go back home Consults called: GI Admission status: Inpatient   Debbe Odea MD [facility] Pager: [email or web address] Password [email or web address] 7PM-7AM, please contact night-coverage   05/05/2019, 3:23 PM

## 2019-05-05 NOTE — ED Triage Notes (Signed)
Pt was in process of going in for Colonoscopy this morning. Noted to be in A Fib, GI spoke with Cardio whom requested she be sent to the ED for evaluation

## 2019-05-05 NOTE — Consult Note (Addendum)
Consultation  Referring Provider: Dr. Wyvonnia Dusky  Primary Care Physician:  Chevis Pretty, FNP Primary Gastroenterologist: Dr. Loletha Carrow        Reason for Consultation: Abdominal pain and weight loss             HPI:   Veronica Paul is a 84 y.o. female with a past medical history as listed below including A. fib on Eliquis, diabetes, hypertension, history of lung cancer, who presented to the ER today from our outpatient endoscopy center with rapid A. fib and palpitations.  We are consulted in regards to her abdominal pain and weight loss.    Discussed case with Dr. Loletha Carrow.  Patient was having colonoscopy for further work-up of abdominal pain and weight loss.  Patient's Eliquis was held 3 days ago.    Today, patient tells me that she has been having lower cramping abdominal pain which is rated as a 9-10/10 which comes off and on, typically after eating dinner and before she falls asleep at night.  Along with this has a decrease in appetite and per family members has hardly been eating anything at all.  She has lost a total of 30 pounds over the past few months since our clinic started working this up.    Patient states that she did not take her Cardizem this morning because she can only take this pill with something to eat and was not allowed to eat.    Denies fever, chills or blood in her stool.     GI history: 12/29/2018 consult by Dr. Loletha Carrow outpatient: At that time patient reported left upper quadrant pain for a few months which seemed to start if she was diagnosed with A. fib, chronic constipation, noted to be previously have been treated for H pylori 10/03/2018, last colonoscopy in January 2015 with pandiverticulosis, notably severe in the sigmoid colon otherwise normal, and also seen in surgery in the past for some cholelithiasis but this is thought asymptomatic; plan was for EGD and CT of the abdomen pelvis with oral and IV contrast 01/07/2019 CT abdomen pelvis with contrast: Specific  cause for the patient's epigastric pain was not identified, scarring in the left lower lobe, multiple uterine masses, likely fibroids, moderate cardiomegaly, cholelithiasis, extensive sigmoid diverticulosis, coronary atherosclerosis and lumbar spondylosis with degenerative disc disease as well as small hypodense lesion left mid kidney 01/26/2019 EGD this was completely normal 04/18/2019 phone message from Dr. Loletha Carrow: Patient continues ongoing weight loss, now pain reportedly in the lower abdomen, fibroids not felt to be source of pain, PCP was asking about colonoscopy  Past Medical History:  Diagnosis Date  . Allergy    "my nose runs alot"  . Anxiety   . Arthritis   . Atrial fibrillation ([place])   . Blood transfusion without reported diagnosis    "when I had a miscarrage in 1957"  . Cataract    bilateral removed  . Cholelithiasis   . Constipation    x ray showed increased stool in colon- uses Miralax daily and Dulcolax twice a week   . Depression   . Diabetes mellitus   . Dyslipidemia   . HTN (hypertension)   . Hx of long-term (current) use of anticoagulants   . Hyperlipidemia   . Hypertension   . Hypothyroidism   . Lung cancer ([place])   . Obesity   . Osteopenia   . Vitamin D deficiency     Past Surgical History:  Procedure Laterality Date  . CATARACT EXTRACTION Bilateral   .  COLONOSCOPY    . LUNG REMOVAL, PARTIAL  1998   Right  . THYROIDECTOMY    . TUBAL LIGATION    . UPPER GASTROINTESTINAL ENDOSCOPY      Family History  Problem Relation Age of Onset  . Coronary artery disease Brother 59  . Hypertension Brother   . Heart disease Brother   . Hypertension Mother   . Hypertension Father   . Heart disease Father   . Hypertension Sister   . Heart disease Sister   . Cancer Sister   . Arthritis Daughter   . COPD Daughter   . Fibromyalgia Daughter   . Ulcerative colitis Son   . Stroke Maternal Grandmother   . Cancer Brother   . Lung cancer Brother   . Hypertension Sister    . Cancer Sister   . Colon cancer Neg Hx   . Food intolerance Neg Hx   . Esophageal cancer Neg Hx   . Rectal cancer Neg Hx   . Stomach cancer Neg Hx   . Colon polyps Neg Hx     Social History   Tobacco Use  . Smoking status: Never Smoker  . Smokeless tobacco: Never Used  Substance Use Topics  . Alcohol use: No  . Drug use: No    Prior to Admission medications   Medication Sig Start Date End Date Taking? Authorizing Provider  apixaban (ELIQUIS) 5 MG TABS tablet Take 1 tablet (5 mg total) by mouth 2 (two) times daily. 04/29/19   Hassell Done, Mary-Margaret, FNP  bisacodyl (DULCOLAX) 5 MG EC tablet Take 5 mg by mouth as directed. Twice a week - stool softener    [provider]  Blood Glucose Monitoring Suppl (ONETOUCH VERIO) w/Device KIT Check blood sugars daily Dx E11.9 08/24/18   Hassell Done, Mary-Margaret, FNP  clonazePAM (KLONOPIN) 0.5 MG tablet Take 1 tablet (0.5 mg total) by mouth 2 (two) times daily as needed. 04/29/19   Hassell Done, Mary-Margaret, FNP  diltiazem (CARDIZEM CD) 360 MG 24 hr capsule Take 1 capsule (360 mg total) by mouth daily. 04/29/19   Hassell Done Mary-Margaret, FNP  fish oil-omega-3 fatty acids 1000 MG capsule Take 2 g by mouth daily.      [provider]  furosemide (LASIX) 20 MG tablet Take 1/2 tablet as needed 3 times weekly 04/29/19   Chevis Pretty, FNP  glucose blood [facility] VERIO) test strip Check blood sugars daily Dx E11.9 08/24/18   Chevis Pretty, FNP  Lancets [facility] ULTRASOFT) lancets Check blood sugars daily Dx E11.9 08/24/18   Chevis Pretty, FNP  levothyroxine (SYNTHROID) 75 MCG tablet Take 1 tablet (75 mcg total) by mouth daily. 04/29/19   Hassell Done, Mary-Margaret, FNP  metFORMIN (GLUCOPHAGE) 500 MG tablet Take 1 tablet (500 mg total) by mouth daily. 04/29/19   Chevis Pretty, FNP  metoprolol (TOPROL-XL) 200 MG 24 hr tablet TAKE 1 AND 1/2 TABLET DAILY 04/29/19   Hassell Done, Mary-Margaret, FNP  nitroGLYCERIN (NITROSTAT) 0.4 MG SL  tablet PLACE 1 TABLET (0.4 MG TOTAL) UNDER THE TONGUE EVERY 5 (FIVE) MINUTES AS NEEDED FOR CHEST PAIN. 10/26/18   Hassell Done, Mary-Margaret, FNP  polyethylene glycol (MIRALAX / GLYCOLAX) 17 g packet Take 17 g by mouth daily as needed.    [provider]    Current Facility-Administered Medications  Medication Dose Route Frequency Provider Last Rate Last Admin  . 0.9 %  sodium chloride infusion  500 mL Intravenous Once Nelida Meuse III, MD      . diltiazem (CARDIZEM) 125 mg in dextrose 5%  125 mL (1 mg/mL) infusion  5-15 mg/hr Intravenous Continuous Rancour, Stephen, MD 15 mL/hr at 05/05/19 1254 15 mg/hr at 05/05/19 1254   Current Outpatient Medications  Medication Sig Dispense Refill  . apixaban (ELIQUIS) 5 MG TABS tablet Take 1 tablet (5 mg total) by mouth 2 (two) times daily. 180 tablet 1  . bisacodyl (DULCOLAX) 5 MG EC tablet Take 5 mg by mouth as directed. Twice a week - stool softener    . Blood Glucose Monitoring Suppl (ONETOUCH VERIO) w/Device KIT Check blood sugars daily Dx E11.9 1 kit 0  . clonazePAM (KLONOPIN) 0.5 MG tablet Take 1 tablet (0.5 mg total) by mouth 2 (two) times daily as needed. 60 tablet 3  . diltiazem (CARDIZEM CD) 360 MG 24 hr capsule Take 1 capsule (360 mg total) by mouth daily. 90 capsule 3  . fish oil-omega-3 fatty acids 1000 MG capsule Take 2 g by mouth daily.      . furosemide (LASIX) 20 MG tablet Take 1/2 tablet as needed 3 times weekly 90 tablet 1  . glucose blood (ONETOUCH VERIO) test strip Check blood sugars daily Dx E11.9 100 each 3  . Lancets (ONETOUCH ULTRASOFT) lancets Check blood sugars daily Dx E11.9 100 each 3  . levothyroxine (SYNTHROID) 75 MCG tablet Take 1 tablet (75 mcg total) by mouth daily. 90 tablet 1  . metFORMIN (GLUCOPHAGE) 500 MG tablet Take 1 tablet (500 mg total) by mouth daily. 90 tablet 1  . metoprolol (TOPROL-XL) 200 MG 24 hr tablet TAKE 1 AND 1/2 TABLET DAILY 135 tablet 1  . nitroGLYCERIN (NITROSTAT) 0.4 MG SL tablet PLACE 1 TABLET  (0.4 MG TOTAL) UNDER THE TONGUE EVERY 5 (FIVE) MINUTES AS NEEDED FOR CHEST PAIN. 25 tablet 1  . polyethylene glycol (MIRALAX / GLYCOLAX) 17 g packet Take 17 g by mouth daily as needed.      Allergies as of 05/05/2019 - Review Complete 05/05/2019  Allergen Reaction Noted  . Ace inhibitors Cough 06/10/2010  . Feldene [piroxicam]  01/11/2008  . Meloxicam Nausea And Vomiting 06/10/2010  . Prevnar [pneumococcal 13-val conj vacc]  05/30/2013  . Statins Other (See Comments) 09/11/2015  . Sulfa antibiotics  05/03/2014  . Zithromax [azithromycin dihydrate] Itching 06/10/2010     Review of Systems:    Constitutional: No weight loss, fever or chills Skin: No rash Cardiovascular: No chest pain Respiratory: No SOB  Gastrointestinal: See HPI and otherwise negative Genitourinary: No dysuria  Neurological: No headache, dizziness or syncope Musculoskeletal: No new muscle or joint pain Hematologic: No bleeding Psychiatric: No history of depression or anxiety    Physical Exam:  Vital signs in last 24 hours: Temp:  [96.6 F (35.9 C)-97.4 F (36.3 C)] 97.4 F (36.3 C) (03/18 1213) Pulse Rate:  [101-178] 119 (03/18 1330) Resp:  [20-30] 21 (03/18 1330) BP: (97-150)/(67-105) 135/99 (03/18 1330) SpO2:  [97 %-99 %] 99 % (03/18 1330) Weight:  [78.5 kg] 78.5 kg (03/18 0943)   General:   Pleasant Elderly Caucasian female appears to be in NAD, Well developed, Well nourished, alert and cooperative Head:  Normocephalic and atraumatic. Eyes:   PEERL, EOMI. No icterus. Conjunctiva pink. Ears:  Normal auditory acuity. Neck:  Supple Throat: Oral cavity and pharynx without inflammation, swelling or lesion.  Lungs: Respirations even and unlabored. Lungs clear to auscultation bilaterally.   No wheezes, crackles, or rhonchi.  Heart: Normal S1, S2. No MRG. Tachycardic, Irregularly Irregular Rhythm, No peripheral edema, cyanosis or pallor.  Abdomen:  Soft, nondistended, nontender. No  rebound or guarding.  Normal bowel sounds. No appreciable masses or hepatomegaly. Rectal:  Not performed.  Msk:  Symmetrical without gross deformities. Peripheral pulses intact.  Extremities:  Without edema, no deformity or joint abnormality. Neurologic:  Alert and  oriented x4;  grossly normal neurologically.  Skin:   Dry and intact without significant lesions or rashes. Psychiatric: Demonstrates good judgement and reason without abnormal affect or behaviors.   LAB RESULTS: Recent Labs    05/05/19 1220  WBC 10.7*  HGB 14.5  HCT 45.9  PLT 251   BMET Recent Labs    05/05/19 1220  NA 144  K 3.5  CL 106  CO2 24  GLUCOSE 124*  BUN 8  CREATININE 0.89  CALCIUM 8.7*   LFT Recent Labs    05/05/19 1220  PROT 7.5  ALBUMIN 4.2  AST 20  ALT 19  ALKPHOS 55  BILITOT 0.7   STUDIES: DG Chest Portable 1 View  Result Date: 05/05/2019 CLINICAL DATA:  Atrial fibrillation EXAM: PORTABLE CHEST 1 VIEW COMPARISON:  04/14/2019 FINDINGS: Mild cardiomegaly, stable. No focal airspace consolidation, pleural effusion, or pneumothorax. No acute osseous findings. IMPRESSION: Stable mild cardiomegaly. No acute cardiopulmonary findings. Electronically Signed   By: Davina Poke D.O.   On: 05/05/2019 12:42    Impression / Plan:   Impression: 1.  Chronic abdominal pain and weight loss: Work-up has included EGD and CT of the abdomen and pelvis so far with no etiology, colonoscopy being done to consider colorectal malignancies 2.  A. fib with RVR: Typically rate controlled, patient did not take her Cardizem this morning while prepping for colonoscopy  Plan: 1.  Scheduled patient for colonoscopy tomorrow with Dr. Hilarie Fredrickson in the [facility] per recommendations from Dr. Loletha Carrow.  Did discuss risks, benefits, limitations and alternatives and the patient agrees to proceed. 2.  Patient will be on clear liquids today and n.p.o. after midnight 3.  Patient will complete another half of movi prep this evening 4.  We will proceed with  colonoscopy as long as patient is not in A. fib with RVR tomorrow morning, hopefully this resolves overnight with her regular meds 5. Continue to hold Xarelto. D/C Lovenox-started SCDs. 6.  Please await further recommendations from Dr. Hilarie Fredrickson later today.  Thank you for your kind consultation, we will continue to follow.  Lavone Nian Batu Cassin  05/05/2019, 1:38 PM

## 2019-05-05 NOTE — ED Notes (Signed)
hospitalist at bedside

## 2019-05-05 NOTE — Progress Notes (Signed)
Pt brought to room when placed on monitors she is in rapid afib 140's-170's.  Taken back to preop and given 10mg  of esmolol. Preop RN monitoring B/P and HR, also giving a fluid bolus. tb

## 2019-05-05 NOTE — Progress Notes (Signed)
Pt's states no medical or surgical changes since previsit or office visit.  Daughter [name] here for assistance.  Temp LC, VS CW

## 2019-05-05 NOTE — ED Provider Notes (Signed)
[facility] DEPT Provider Note   CSN: [id number] Arrival date & time: 05/05/19  1204     History Chief Complaint  Patient presents with  . Atrial Fibrillation    Veronica Paul is a 84 y.o. female.  Patient with history of atrial fibrillation on Eliquis, diabetes, hypertension, history of lung cancer presenting from endoscopy suite with rapid atrial fibrillation and palpitations.  States she felt fine going to bed last night.  She was preparing for a colonoscopy this morning.  Her Eliquis was held 3 days ago but she states she has been taking her other medications including metoprolol and Cardizem.  She denies any chest pain or shortness of breath.  Does feel slightly lightheaded with palpitations and racing heart.  Has been having frequent loose stools due to her colonoscopy prep.  No fever or vomiting.  No chest pain or shortness of breath.  States she was diagnosed with atrial fibrillation last July and has not had any issues with it since.  The history is provided by the patient and a relative.  Atrial Fibrillation Pertinent negatives include no shortness of breath.       Past Medical History:  Diagnosis Date  . Allergy    "my nose runs alot"  . Anxiety   . Arthritis   . Atrial fibrillation ([place])   . Blood transfusion without reported diagnosis    "when I had a miscarrage in 1957"  . Cataract    bilateral removed  . Cholelithiasis   . Constipation    x ray showed increased stool in colon- uses Miralax daily and Dulcolax twice a week   . Depression   . Diabetes mellitus   . Dyslipidemia   . HTN (hypertension)   . Hx of long-term (current) use of anticoagulants   . Hyperlipidemia   . Hypertension   . Hypothyroidism   . Lung cancer ([place])   . Obesity   . Osteopenia   . Vitamin D deficiency     Patient Active Problem List   Diagnosis Date Noted  . Abdominal pain 03/15/2019  . Elevated coronary artery calcium score 03/15/2019  .  Educated about COVID-19 virus infection 03/15/2019  . Atrial fibrillation with RVR ([place]) 08/17/2018  . Hypokalemia 08/17/2018  . Hypothyroidism 05/23/2013  . Diabetes mellitus without complication ([place]) 73/71/0626  . GAD (generalized anxiety disorder) 02/09/2013  . Depression 06/25/2012  . Peripheral edema 06/25/2012  . Osteopenia   . Hypertension   . Abnormal EKG   . Hyperlipidemia   . BMI 34.0-34.9,adult     Past Surgical History:  Procedure Laterality Date  . CATARACT EXTRACTION Bilateral   . COLONOSCOPY    . LUNG REMOVAL, PARTIAL  1998   Right  . THYROIDECTOMY    . TUBAL LIGATION    . UPPER GASTROINTESTINAL ENDOSCOPY       OB History    Gravida  7   Para      Term      Preterm      AB  3   Living  4     SAB  3   TAB      Ectopic      Multiple      Live Births              Family History  Problem Relation Age of Onset  . Coronary artery disease Brother 67  . Hypertension Brother   . Heart disease Brother   . Hypertension Mother   .  Hypertension Father   . Heart disease Father   . Hypertension Sister   . Heart disease Sister   . Cancer Sister   . Arthritis Daughter   . COPD Daughter   . Fibromyalgia Daughter   . Ulcerative colitis Son   . Stroke Maternal Grandmother   . Cancer Brother   . Lung cancer Brother   . Hypertension Sister   . Cancer Sister   . Colon cancer Neg Hx   . Food intolerance Neg Hx   . Esophageal cancer Neg Hx   . Rectal cancer Neg Hx   . Stomach cancer Neg Hx   . Colon polyps Neg Hx     Social History   Tobacco Use  . Smoking status: Never Smoker  . Smokeless tobacco: Never Used  Substance Use Topics  . Alcohol use: No  . Drug use: No    Home Medications Prior to Admission medications   Medication Sig Start Date End Date Taking? Authorizing Provider  apixaban (ELIQUIS) 5 MG TABS tablet Take 1 tablet (5 mg total) by mouth 2 (two) times daily. 04/29/19   Hassell Done, Mary-Margaret, FNP  bisacodyl (DULCOLAX)  5 MG EC tablet Take 5 mg by mouth as directed. Twice a week - stool softener    [provider]  Blood Glucose Monitoring Suppl (ONETOUCH VERIO) w/Device KIT Check blood sugars daily Dx E11.9 08/24/18   Hassell Done, Mary-Margaret, FNP  clonazePAM (KLONOPIN) 0.5 MG tablet Take 1 tablet (0.5 mg total) by mouth 2 (two) times daily as needed. 04/29/19   Hassell Done, Mary-Margaret, FNP  diltiazem (CARDIZEM CD) 360 MG 24 hr capsule Take 1 capsule (360 mg total) by mouth daily. 04/29/19   Hassell Done Mary-Margaret, FNP  fish oil-omega-3 fatty acids 1000 MG capsule Take 2 g by mouth daily.      [provider]  furosemide (LASIX) 20 MG tablet Take 1/2 tablet as needed 3 times weekly 04/29/19   Chevis Pretty, FNP  glucose blood [facility] VERIO) test strip Check blood sugars daily Dx E11.9 08/24/18   Chevis Pretty, FNP  Lancets [facility] [place] ULTRASOFT) lancets Check blood sugars daily Dx E11.9 08/24/18   Chevis Pretty, FNP  levothyroxine (SYNTHROID) 75 MCG tablet Take 1 tablet (75 mcg total) by mouth daily. 04/29/19   Hassell Done, Mary-Margaret, FNP  metFORMIN (GLUCOPHAGE) 500 MG tablet Take 1 tablet (500 mg total) by mouth daily. 04/29/19   Chevis Pretty, FNP  metoprolol (TOPROL-XL) 200 MG 24 hr tablet TAKE 1 AND 1/2 TABLET DAILY 04/29/19   Hassell Done, Mary-Margaret, FNP  nitroGLYCERIN (NITROSTAT) 0.4 MG SL tablet PLACE 1 TABLET (0.4 MG TOTAL) UNDER THE TONGUE EVERY 5 (FIVE) MINUTES AS NEEDED FOR CHEST PAIN. 10/26/18   Hassell Done, Mary-Margaret, FNP  polyethylene glycol (MIRALAX / GLYCOLAX) 17 g packet Take 17 g by mouth daily as needed.    [provider]    Allergies    Ace inhibitors, Feldene [piroxicam], Meloxicam, Prevnar [pneumococcal 13-val conj vacc], Statins, Sulfa antibiotics, and Zithromax [azithromycin dihydrate]  Review of Systems   Review of Systems  Constitutional: Positive for fatigue. Negative for activity change and appetite change.  HENT: Negative for congestion and  rhinorrhea.   Respiratory: Negative for chest tightness and shortness of breath.   Cardiovascular: Positive for palpitations.  Gastrointestinal: Negative for nausea and vomiting.  Genitourinary: Negative for dysuria and hematuria.  Neurological: Positive for weakness and light-headedness.   all other systems are negative except as noted in the HPI and PMH.    Physical Exam Updated Vital  Signs BP (!) 150/97 (BP Location: Right Arm)   Pulse (!) 178   Temp (!) 97.4 F (36.3 C) (Oral)   Resp 20   LMP 05/11/1992   SpO2 97%   Physical Exam Vitals and nursing note reviewed.  Constitutional:      General: She is not in acute distress.    Appearance: She is well-developed. She is obese.  HENT:     Head: Normocephalic and atraumatic.     Mouth/Throat:     Pharynx: No oropharyngeal exudate.  Eyes:     Conjunctiva/sclera: Conjunctivae normal.     Pupils: Pupils are equal, round, and reactive to light.  Neck:     Comments: No meningismus. Cardiovascular:     Rate and Rhythm: Tachycardia present. Rhythm irregular.     Heart sounds: Normal heart sounds. No murmur.  Pulmonary:     Effort: Pulmonary effort is normal. No respiratory distress.     Breath sounds: Normal breath sounds.  Chest:     Chest wall: No tenderness.  Abdominal:     Palpations: Abdomen is soft.     Tenderness: There is no abdominal tenderness. There is no guarding or rebound.  Musculoskeletal:        General: No tenderness. Normal range of motion.     Cervical back: Normal range of motion and neck supple.  Skin:    General: Skin is warm.  Neurological:     Mental Status: She is alert and oriented to person, place, and time.     Cranial Nerves: No cranial nerve deficit.     Motor: No abnormal muscle tone.     Coordination: Coordination normal.     Comments: No ataxia on finger to nose bilaterally. No pronator drift. 5/5 strength throughout. CN 2-12 intact.Equal grip strength. Sensation intact.   Psychiatric:         Behavior: Behavior normal.     ED Results / Procedures / Treatments   Labs (all labs ordered are listed, but only abnormal results are displayed) Labs Reviewed  CBC WITH DIFFERENTIAL/PLATELET - Abnormal; Notable for the following components:      Result Value   WBC 10.7 (*)    Neutro Abs 8.1 (*)    All other components within normal limits  COMPREHENSIVE METABOLIC PANEL - Abnormal; Notable for the following components:   Glucose, Bld 124 (*)    Calcium 8.7 (*)    All other components within normal limits  BRAIN NATRIURETIC PEPTIDE - Abnormal; Notable for the following components:   B Natriuretic Peptide 690.7 (*)    All other components within normal limits  SARS CORONAVIRUS 2 (TAT 6-24 HRS)  TSH  T4, FREE  BASIC METABOLIC PANEL  CBC  TROPONIN I (HIGH SENSITIVITY)  TROPONIN I (HIGH SENSITIVITY)    EKG EKG Interpretation  Date/Time:  Thursday May 05 2019 13:05:07 EDT Ventricular Rate:  112 PR Interval:    QRS Duration: 106 QT Interval:  360 QTC Calculation: 465 R Axis:   81 Text Interpretation: Atrial fibrillation Anterior infarct, old Atrial fibrillation with RVR Confirmed by Ezequiel Essex [phone]) on 05/05/2019 1:09:42 PM   Radiology DG Chest Portable 1 View  Result Date: 05/05/2019 CLINICAL DATA:  Atrial fibrillation EXAM: PORTABLE CHEST 1 VIEW COMPARISON:  04/14/2019 FINDINGS: Mild cardiomegaly, stable. No focal airspace consolidation, pleural effusion, or pneumothorax. No acute osseous findings. IMPRESSION: Stable mild cardiomegaly. No acute cardiopulmonary findings. Electronically Signed   By: Davina Poke D.O.   On: 05/05/2019 12:42  Procedures .Critical Care Performed by: Ezequiel Essex, MD Authorized by: Ezequiel Essex, MD   Critical care provider statement:    Critical care time (minutes):  45   Critical care was necessary to treat or prevent imminent or life-threatening deterioration of the following conditions: afib with RVR.    Critical care was time spent personally by me on the following activities:  Discussions with consultants, evaluation of patient's response to treatment, examination of patient, ordering and performing treatments and interventions, ordering and review of laboratory studies, ordering and review of radiographic studies, pulse oximetry, re-evaluation of patient's condition, obtaining history from patient or surrogate and review of old charts   (including critical care time)  Medications Ordered in ED Medications  diltiazem (CARDIZEM) 1 mg/mL load via infusion 10 mg (has no administration in time range)    And  diltiazem (CARDIZEM) 125 mg in dextrose 5% 125 mL (1 mg/mL) infusion (has no administration in time range)    ED Course  I have reviewed the triage vital signs and the nursing notes.  Pertinent labs & imaging results that were available during my care of the patient were reviewed by me and considered in my medical decision making (see chart for details).    MDM Rules/Calculators/A&P                     History of atrial fibrillation here with rapid A. fib and palpitations.  Has been off of Eliquis for 3 days in preparation for colonoscopy.  Has been taking her Cardizem and metoprolol. Did not take cardizem this morning but did take metoprolol last night.   Unclear onset of symptoms but states she felt well yesterday. She has been off of her Eliquis, will not proceed with cardioversion at this time. Unclear how long she has been in Rapid afib.  Started IV Cardizem for rapid A. fib.  Electrolytes are reassuring.  Heart rate improving to the 100s to 110s on IV Cardizem.  She has not had her Eliquis for several days.  Discussed with gastroenterology PA-C [place] [place].  They will take to patient but colonoscopy for today is cancelled. She D/w Dr. Loletha Carrow would prefer to attempt to do colonoscopy tomorrow while she is inpatient. she may have clear liquids today.  Heart rate improving on IV  Cardizem.  Patient now states she did not take her p.o. Cardizem this morning.  She did take her Lopressor last night.  Denies any chest pain or shortness of breath.  Admission discussed with Dr. Wynelle Cleveland.  She states may start amiodarone if rates are controlled on Cardizem and metoprolol.  Will ask cardiology to see.  Plan for inpatient colonoscopy tomorrow most likely. Observation for rapid A. fib with RVR discussed with Dr. Wynelle Cleveland.  Patient and daughter updated   ED ECG REPORT   Date: 05/05/2019  Rate: 178  Rhythm: atrial fibrillation  QRS Axis: normal  Intervals: normal  ST/T Wave abnormalities: nonspecific ST/T changes  Conduction Disutrbances:none  Narrative Interpretation:   Old EKG Reviewed: changes noted  I have personally reviewed the EKG tracing and agree with the computerized printout as noted.  Final Clinical Impression(s) / ED Diagnoses Final diagnoses:  Atrial fibrillation with RVR [facility])    Rx / DC Orders ED Discharge Orders    None       Aela Bohan, Annie Main, MD 05/05/19 1844

## 2019-05-05 NOTE — H&P (View-Only) (Signed)
Consultation  Referring Provider: Dr. Wyvonnia Dusky  Primary Care Physician:  Chevis Pretty, FNP Primary Gastroenterologist: Dr. Loletha Carrow        Reason for Consultation: Abdominal pain and weight loss             HPI:   Veronica Paul is a 84 y.o. female with a past medical history as listed below including A. fib on Eliquis, diabetes, hypertension, history of lung cancer, who presented to the ER today from our outpatient endoscopy center with rapid A. fib and palpitations.  We are consulted in regards to her abdominal pain and weight loss.    Discussed case with Dr. Loletha Carrow.  Patient was having colonoscopy for further work-up of abdominal pain and weight loss.  Patient's Eliquis was held 3 days ago.    Today, patient tells me that she has been having lower cramping abdominal pain which is rated as a 9-10/10 which comes off and on, typically after eating dinner and before she falls asleep at night.  Along with this has a decrease in appetite and per family members has hardly been eating anything at all.  She has lost a total of 30 pounds over the past few months since our clinic started working this up.    Patient states that she did not take her Cardizem this morning because she can only take this pill with something to eat and was not allowed to eat.    Denies fever, chills or blood in her stool.     GI history: 12/29/2018 consult by Dr. Loletha Carrow outpatient: At that time patient reported left upper quadrant pain for a few months which seemed to start if she was diagnosed with A. fib, chronic constipation, noted to be previously have been treated for H pylori 10/03/2018, last colonoscopy in January 2015 with pandiverticulosis, notably severe in the sigmoid colon otherwise normal, and also seen in surgery in the past for some cholelithiasis but this is thought asymptomatic; plan was for EGD and CT of the abdomen pelvis with oral and IV contrast 01/07/2019 CT abdomen pelvis with contrast: Specific  cause for the patient's epigastric pain was not identified, scarring in the left lower lobe, multiple uterine masses, likely fibroids, moderate cardiomegaly, cholelithiasis, extensive sigmoid diverticulosis, coronary atherosclerosis and lumbar spondylosis with degenerative disc disease as well as small hypodense lesion left mid kidney 01/26/2019 EGD this was completely normal 04/18/2019 phone message from Dr. Loletha Carrow: Patient continues ongoing weight loss, now pain reportedly in the lower abdomen, fibroids not felt to be source of pain, PCP was asking about colonoscopy  Past Medical History:  Diagnosis Date  . Allergy    "my nose runs alot"  . Anxiety   . Arthritis   . Atrial fibrillation ([place])   . Blood transfusion without reported diagnosis    "when I had a miscarrage in 1957"  . Cataract    bilateral removed  . Cholelithiasis   . Constipation    x ray showed increased stool in colon- uses Miralax daily and Dulcolax twice a week   . Depression   . Diabetes mellitus   . Dyslipidemia   . HTN (hypertension)   . Hx of long-term (current) use of anticoagulants   . Hyperlipidemia   . Hypertension   . Hypothyroidism   . Lung cancer ([place])   . Obesity   . Osteopenia   . Vitamin D deficiency     Past Surgical History:  Procedure Laterality Date  . CATARACT EXTRACTION Bilateral   .  COLONOSCOPY    . LUNG REMOVAL, PARTIAL  1998   Right  . THYROIDECTOMY    . TUBAL LIGATION    . UPPER GASTROINTESTINAL ENDOSCOPY      Family History  Problem Relation Age of Onset  . Coronary artery disease Brother 59  . Hypertension Brother   . Heart disease Brother   . Hypertension Mother   . Hypertension Father   . Heart disease Father   . Hypertension Sister   . Heart disease Sister   . Cancer Sister   . Arthritis Daughter   . COPD Daughter   . Fibromyalgia Daughter   . Ulcerative colitis Son   . Stroke Maternal Grandmother   . Cancer Brother   . Lung cancer Brother   . Hypertension Sister    . Cancer Sister   . Colon cancer Neg Hx   . Food intolerance Neg Hx   . Esophageal cancer Neg Hx   . Rectal cancer Neg Hx   . Stomach cancer Neg Hx   . Colon polyps Neg Hx     Social History   Tobacco Use  . Smoking status: Never Smoker  . Smokeless tobacco: Never Used  Substance Use Topics  . Alcohol use: No  . Drug use: No    Prior to Admission medications   Medication Sig Start Date End Date Taking? Authorizing Provider  apixaban (ELIQUIS) 5 MG TABS tablet Take 1 tablet (5 mg total) by mouth 2 (two) times daily. 04/29/19   Hassell Done, Mary-Margaret, FNP  bisacodyl (DULCOLAX) 5 MG EC tablet Take 5 mg by mouth as directed. Twice a week - stool softener    [provider]  Blood Glucose Monitoring Suppl (ONETOUCH VERIO) w/Device KIT Check blood sugars daily Dx E11.9 08/24/18   Hassell Done, Mary-Margaret, FNP  clonazePAM (KLONOPIN) 0.5 MG tablet Take 1 tablet (0.5 mg total) by mouth 2 (two) times daily as needed. 04/29/19   Hassell Done, Mary-Margaret, FNP  diltiazem (CARDIZEM CD) 360 MG 24 hr capsule Take 1 capsule (360 mg total) by mouth daily. 04/29/19   Hassell Done Mary-Margaret, FNP  fish oil-omega-3 fatty acids 1000 MG capsule Take 2 g by mouth daily.      [provider]  furosemide (LASIX) 20 MG tablet Take 1/2 tablet as needed 3 times weekly 04/29/19   Chevis Pretty, FNP  glucose blood [facility] VERIO) test strip Check blood sugars daily Dx E11.9 08/24/18   Chevis Pretty, FNP  Lancets [facility] ULTRASOFT) lancets Check blood sugars daily Dx E11.9 08/24/18   Chevis Pretty, FNP  levothyroxine (SYNTHROID) 75 MCG tablet Take 1 tablet (75 mcg total) by mouth daily. 04/29/19   Hassell Done, Mary-Margaret, FNP  metFORMIN (GLUCOPHAGE) 500 MG tablet Take 1 tablet (500 mg total) by mouth daily. 04/29/19   Chevis Pretty, FNP  metoprolol (TOPROL-XL) 200 MG 24 hr tablet TAKE 1 AND 1/2 TABLET DAILY 04/29/19   Hassell Done, Mary-Margaret, FNP  nitroGLYCERIN (NITROSTAT) 0.4 MG SL  tablet PLACE 1 TABLET (0.4 MG TOTAL) UNDER THE TONGUE EVERY 5 (FIVE) MINUTES AS NEEDED FOR CHEST PAIN. 10/26/18   Hassell Done, Mary-Margaret, FNP  polyethylene glycol (MIRALAX / GLYCOLAX) 17 g packet Take 17 g by mouth daily as needed.    [provider]    Current Facility-Administered Medications  Medication Dose Route Frequency Provider Last Rate Last Admin  . 0.9 %  sodium chloride infusion  500 mL Intravenous Once Nelida Meuse III, MD      . diltiazem (CARDIZEM) 125 mg in dextrose 5%  125 mL (1 mg/mL) infusion  5-15 mg/hr Intravenous Continuous Rancour, Stephen, MD 15 mL/hr at 05/05/19 1254 15 mg/hr at 05/05/19 1254   Current Outpatient Medications  Medication Sig Dispense Refill  . apixaban (ELIQUIS) 5 MG TABS tablet Take 1 tablet (5 mg total) by mouth 2 (two) times daily. 180 tablet 1  . bisacodyl (DULCOLAX) 5 MG EC tablet Take 5 mg by mouth as directed. Twice a week - stool softener    . Blood Glucose Monitoring Suppl (ONETOUCH VERIO) w/Device KIT Check blood sugars daily Dx E11.9 1 kit 0  . clonazePAM (KLONOPIN) 0.5 MG tablet Take 1 tablet (0.5 mg total) by mouth 2 (two) times daily as needed. 60 tablet 3  . diltiazem (CARDIZEM CD) 360 MG 24 hr capsule Take 1 capsule (360 mg total) by mouth daily. 90 capsule 3  . fish oil-omega-3 fatty acids 1000 MG capsule Take 2 g by mouth daily.      . furosemide (LASIX) 20 MG tablet Take 1/2 tablet as needed 3 times weekly 90 tablet 1  . glucose blood (ONETOUCH VERIO) test strip Check blood sugars daily Dx E11.9 100 each 3  . Lancets (ONETOUCH ULTRASOFT) lancets Check blood sugars daily Dx E11.9 100 each 3  . levothyroxine (SYNTHROID) 75 MCG tablet Take 1 tablet (75 mcg total) by mouth daily. 90 tablet 1  . metFORMIN (GLUCOPHAGE) 500 MG tablet Take 1 tablet (500 mg total) by mouth daily. 90 tablet 1  . metoprolol (TOPROL-XL) 200 MG 24 hr tablet TAKE 1 AND 1/2 TABLET DAILY 135 tablet 1  . nitroGLYCERIN (NITROSTAT) 0.4 MG SL tablet PLACE 1 TABLET  (0.4 MG TOTAL) UNDER THE TONGUE EVERY 5 (FIVE) MINUTES AS NEEDED FOR CHEST PAIN. 25 tablet 1  . polyethylene glycol (MIRALAX / GLYCOLAX) 17 g packet Take 17 g by mouth daily as needed.      Allergies as of 05/05/2019 - Review Complete 05/05/2019  Allergen Reaction Noted  . Ace inhibitors Cough 06/10/2010  . Feldene [piroxicam]  01/11/2008  . Meloxicam Nausea And Vomiting 06/10/2010  . Prevnar [pneumococcal 13-val conj vacc]  05/30/2013  . Statins Other (See Comments) 09/11/2015  . Sulfa antibiotics  05/03/2014  . Zithromax [azithromycin dihydrate] Itching 06/10/2010     Review of Systems:    Constitutional: No weight loss, fever or chills Skin: No rash Cardiovascular: No chest pain Respiratory: No SOB  Gastrointestinal: See HPI and otherwise negative Genitourinary: No dysuria  Neurological: No headache, dizziness or syncope Musculoskeletal: No new muscle or joint pain Hematologic: No bleeding Psychiatric: No history of depression or anxiety    Physical Exam:  Vital signs in last 24 hours: Temp:  [96.6 F (35.9 C)-97.4 F (36.3 C)] 97.4 F (36.3 C) (03/18 1213) Pulse Rate:  [101-178] 119 (03/18 1330) Resp:  [20-30] 21 (03/18 1330) BP: (97-150)/(67-105) 135/99 (03/18 1330) SpO2:  [97 %-99 %] 99 % (03/18 1330) Weight:  [78.5 kg] 78.5 kg (03/18 0943)   General:   Pleasant Elderly Caucasian female appears to be in NAD, Well developed, Well nourished, alert and cooperative Head:  Normocephalic and atraumatic. Eyes:   PEERL, EOMI. No icterus. Conjunctiva pink. Ears:  Normal auditory acuity. Neck:  Supple Throat: Oral cavity and pharynx without inflammation, swelling or lesion.  Lungs: Respirations even and unlabored. Lungs clear to auscultation bilaterally.   No wheezes, crackles, or rhonchi.  Heart: Normal S1, S2. No MRG. Tachycardic, Irregularly Irregular Rhythm, No peripheral edema, cyanosis or pallor.  Abdomen:  Soft, nondistended, nontender. No  rebound or guarding.  Normal bowel sounds. No appreciable masses or hepatomegaly. Rectal:  Not performed.  Msk:  Symmetrical without gross deformities. Peripheral pulses intact.  Extremities:  Without edema, no deformity or joint abnormality. Neurologic:  Alert and  oriented x4;  grossly normal neurologically.  Skin:   Dry and intact without significant lesions or rashes. Psychiatric: Demonstrates good judgement and reason without abnormal affect or behaviors.   LAB RESULTS: Recent Labs    05/05/19 1220  WBC 10.7*  HGB 14.5  HCT 45.9  PLT 251   BMET Recent Labs    05/05/19 1220  NA 144  K 3.5  CL 106  CO2 24  GLUCOSE 124*  BUN 8  CREATININE 0.89  CALCIUM 8.7*   LFT Recent Labs    05/05/19 1220  PROT 7.5  ALBUMIN 4.2  AST 20  ALT 19  ALKPHOS 55  BILITOT 0.7   STUDIES: DG Chest Portable 1 View  Result Date: 05/05/2019 CLINICAL DATA:  Atrial fibrillation EXAM: PORTABLE CHEST 1 VIEW COMPARISON:  04/14/2019 FINDINGS: Mild cardiomegaly, stable. No focal airspace consolidation, pleural effusion, or pneumothorax. No acute osseous findings. IMPRESSION: Stable mild cardiomegaly. No acute cardiopulmonary findings. Electronically Signed   By: Davina Poke D.O.   On: 05/05/2019 12:42    Impression / Plan:   Impression: 1.  Chronic abdominal pain and weight loss: Work-up has included EGD and CT of the abdomen and pelvis so far with no etiology, colonoscopy being done to consider colorectal malignancies 2.  A. fib with RVR: Typically rate controlled, patient did not take her Cardizem this morning while prepping for colonoscopy  Plan: 1.  Scheduled patient for colonoscopy tomorrow with Dr. Hilarie Fredrickson in the [facility] per recommendations from Dr. Loletha Carrow.  Did discuss risks, benefits, limitations and alternatives and the patient agrees to proceed. 2.  Patient will be on clear liquids today and n.p.o. after midnight 3.  Patient will complete another half of movi prep this evening 4.  We will proceed with  colonoscopy as long as patient is not in A. fib with RVR tomorrow morning, hopefully this resolves overnight with her regular meds 5. Continue to hold Xarelto. D/C Lovenox-started SCDs. 6.  Please await further recommendations from Dr. Hilarie Fredrickson later today.  Thank you for your kind consultation, we will continue to follow.  Lavone Nian Charmine Bockrath  05/05/2019, 1:38 PM

## 2019-05-06 ENCOUNTER — Observation Stay (HOSPITAL_COMMUNITY): Payer: Medicare HMO | Admitting: Registered Nurse

## 2019-05-06 ENCOUNTER — Encounter (HOSPITAL_COMMUNITY)
Admission: EM | Disposition: A | Discharge status: Home / Self Care | Payer: Self-pay | Source: Home / Self Care | Attending: Emergency Medicine

## 2019-05-06 ENCOUNTER — Encounter (HOSPITAL_COMMUNITY): Payer: Self-pay | Admitting: Internal Medicine

## 2019-05-06 DIAGNOSIS — E890 Postprocedural hypothyroidism: Secondary | ICD-10-CM | POA: Diagnosis not present

## 2019-05-06 DIAGNOSIS — E119 Type 2 diabetes mellitus without complications: Secondary | ICD-10-CM | POA: Diagnosis not present

## 2019-05-06 DIAGNOSIS — I4891 Unspecified atrial fibrillation: Secondary | ICD-10-CM | POA: Diagnosis not present

## 2019-05-06 DIAGNOSIS — K635 Polyp of colon: Secondary | ICD-10-CM

## 2019-05-06 DIAGNOSIS — D122 Benign neoplasm of ascending colon: Secondary | ICD-10-CM

## 2019-05-06 DIAGNOSIS — K5730 Diverticulosis of large intestine without perforation or abscess without bleeding: Secondary | ICD-10-CM | POA: Diagnosis not present

## 2019-05-06 DIAGNOSIS — R300 Dysuria: Secondary | ICD-10-CM | POA: Diagnosis not present

## 2019-05-06 DIAGNOSIS — E669 Obesity, unspecified: Secondary | ICD-10-CM | POA: Diagnosis not present

## 2019-05-06 DIAGNOSIS — D123 Benign neoplasm of transverse colon: Secondary | ICD-10-CM

## 2019-05-06 DIAGNOSIS — Z20822 Contact with and (suspected) exposure to covid-19: Secondary | ICD-10-CM | POA: Diagnosis not present

## 2019-05-06 DIAGNOSIS — D124 Benign neoplasm of descending colon: Secondary | ICD-10-CM

## 2019-05-06 DIAGNOSIS — K648 Other hemorrhoids: Secondary | ICD-10-CM | POA: Diagnosis not present

## 2019-05-06 DIAGNOSIS — R1030 Lower abdominal pain, unspecified: Secondary | ICD-10-CM | POA: Diagnosis not present

## 2019-05-06 DIAGNOSIS — D120 Benign neoplasm of cecum: Secondary | ICD-10-CM | POA: Diagnosis not present

## 2019-05-06 DIAGNOSIS — E785 Hyperlipidemia, unspecified: Secondary | ICD-10-CM | POA: Diagnosis not present

## 2019-05-06 DIAGNOSIS — B356 Tinea cruris: Secondary | ICD-10-CM | POA: Diagnosis not present

## 2019-05-06 DIAGNOSIS — R338 Other retention of urine: Secondary | ICD-10-CM | POA: Diagnosis not present

## 2019-05-06 DIAGNOSIS — R634 Abnormal weight loss: Secondary | ICD-10-CM | POA: Diagnosis not present

## 2019-05-06 DIAGNOSIS — E039 Hypothyroidism, unspecified: Secondary | ICD-10-CM | POA: Diagnosis not present

## 2019-05-06 DIAGNOSIS — D125 Benign neoplasm of sigmoid colon: Secondary | ICD-10-CM

## 2019-05-06 DIAGNOSIS — M1990 Unspecified osteoarthritis, unspecified site: Secondary | ICD-10-CM | POA: Diagnosis not present

## 2019-05-06 DIAGNOSIS — I10 Essential (primary) hypertension: Secondary | ICD-10-CM | POA: Diagnosis not present

## 2019-05-06 HISTORY — PX: POLYPECTOMY: SHX5525

## 2019-05-06 HISTORY — PX: COLONOSCOPY WITH PROPOFOL: SHX5780

## 2019-05-06 LAB — BASIC METABOLIC PANEL
Anion gap: 10 (ref 5–15)
BUN: 6 mg/dL — ABNORMAL LOW (ref 8–23)
CO2: 25 mmol/L (ref 22–32)
Calcium: 8.4 mg/dL — ABNORMAL LOW (ref 8.9–10.3)
Chloride: 108 mmol/L (ref 98–111)
Creatinine, Ser: 0.61 mg/dL (ref 0.44–1.00)
GFR calc Af Amer: >60 mL/min (ref >60)
GFR calc non Af Amer: >60 mL/min (ref >60)
Glucose, Bld: 108 mg/dL — ABNORMAL HIGH (ref 70–99)
Potassium: 3 mmol/L — ABNORMAL LOW (ref 3.5–5.1)
Sodium: 143 mmol/L (ref 135–145)

## 2019-05-06 LAB — GLUCOSE, CAPILLARY
Glucose-Capillary: 99 mg/dL (ref 70–99)
Glucose-Capillary: 106 mg/dL — ABNORMAL HIGH (ref 70–99)
Glucose-Capillary: 124 mg/dL — ABNORMAL HIGH (ref 70–99)
Glucose-Capillary: 104 mg/dL — ABNORMAL HIGH (ref 70–99)
Glucose-Capillary: 88 mg/dL (ref 70–99)

## 2019-05-06 LAB — CBC
HCT: 44.3 % (ref 36.0–46.0)
Hemoglobin: 14 g/dL (ref 12.0–15.0)
MCH: 29.1 pg (ref 26.0–34.0)
MCHC: 31.6 g/dL (ref 30.0–36.0)
MCV: 92.1 fL (ref 80.0–100.0)
Platelets: 228 10*3/uL (ref 150–400)
RBC: 4.81 MIL/uL (ref 3.87–5.11)
RDW: 14.2 % (ref 11.5–15.5)
WBC: 9.3 10*3/uL (ref 4.0–10.5)
nRBC: 0 % (ref 0.0–0.2)

## 2019-05-06 LAB — MRSA PCR SCREENING: MRSA by PCR: NEGATIVE

## 2019-05-06 LAB — SARS CORONAVIRUS 2 (TAT 6-24 HRS): SARS Coronavirus 2: NEGATIVE

## 2019-05-06 SURGERY — COLONOSCOPY WITH PROPOFOL
Anesthesia: Monitor Anesthesia Care

## 2019-05-06 MED ORDER — LACTATED RINGERS IV SOLN: INTRAVENOUS | Status: DC | PRN

## 2019-05-06 MED ORDER — METOPROLOL TARTRATE 5 MG/5ML IV SOLN
5.0000 mg | Freq: Once | INTRAVENOUS | Status: AC
  Administered 2019-05-06: 5 mg via INTRAVENOUS
  Filled 2019-05-06: qty 5

## 2019-05-06 MED ORDER — SODIUM CHLORIDE 0.9 % IV SOLN: INTRAVENOUS | Status: DC

## 2019-05-06 MED ORDER — NYSTATIN 100000 UNIT/GM EX OINT
TOPICAL_OINTMENT | Freq: Two times a day (BID) | CUTANEOUS | Status: DC
  Filled 2019-05-06: qty 15

## 2019-05-06 MED ORDER — POTASSIUM CHLORIDE 10 MEQ/100ML IV SOLN
10.0000 meq | INTRAVENOUS | Status: AC
  Administered 2019-05-06 (×4): 10 meq via INTRAVENOUS
  Filled 2019-05-06 (×4): qty 100

## 2019-05-06 MED ORDER — PROPOFOL 500 MG/50ML IV EMUL
INTRAVENOUS | Status: DC | PRN
  Administered 2019-05-06: 20 mg via INTRAVENOUS
  Administered 2019-05-06: 130 ug/kg/min via INTRAVENOUS

## 2019-05-06 MED ORDER — DILTIAZEM HCL ER COATED BEADS 180 MG PO CP24
360.0000 mg | ORAL_CAPSULE | Freq: Every day | ORAL | Status: DC
  Administered 2019-05-06 – 2019-05-08 (×3): 360 mg via ORAL
  Filled 2019-05-06 (×3): qty 2

## 2019-05-06 MED ORDER — CHLORHEXIDINE GLUCONATE CLOTH 2 % EX PADS
6.0000 | MEDICATED_PAD | Freq: Every day | CUTANEOUS | Status: DC
  Administered 2019-05-06 – 2019-05-07 (×2): 6 via TOPICAL

## 2019-05-06 MED ORDER — ORAL CARE MOUTH RINSE
15.0000 mL | Freq: Two times a day (BID) | OROMUCOSAL | Status: DC
  Administered 2019-05-06 – 2019-05-07 (×4): 15 mL via OROMUCOSAL

## 2019-05-06 MED ORDER — PHENYLEPHRINE 40 MCG/ML (10ML) SYRINGE FOR IV PUSH (FOR BLOOD PRESSURE SUPPORT)
PREFILLED_SYRINGE | INTRAVENOUS | Status: DC | PRN
  Administered 2019-05-06: 80 ug via INTRAVENOUS

## 2019-05-06 SURGICAL SUPPLY — 22 items

## 2019-05-06 NOTE — Transfer of Care (Signed)
Immediate Anesthesia Transfer of Care Note  Patient: Veronica Paul  Procedure(s) Performed: COLONOSCOPY WITH PROPOFOL (N/A ) POLYPECTOMY  Patient Location: PACU and Endoscopy Unit  Anesthesia Type:MAC  Level of Consciousness: awake, alert , oriented and patient cooperative  Airway & Oxygen Therapy: Patient Spontanous Breathing and Patient connected to face mask oxygen  Post-op Assessment: Report given to RN, Post -op Vital signs reviewed and stable and Patient moving all extremities  Post vital signs: Reviewed and stable  Last Vitals:  Vitals Value Taken Time  BP    Temp    Pulse    Resp    SpO2      Last Pain:  Vitals:   05/06/19 1129  TempSrc: Oral  PainSc: 0-No pain         Complications: No apparent anesthesia complications

## 2019-05-06 NOTE — Progress Notes (Signed)
SLP Cancellation Note  Patient Details Name: KASHVI PREVETTE MRN: [id number] DOB: 23-Oct-1935   Cancelled treatment:       Reason Eval/Treat Not Completed: Other (comment);Patient at procedure or test/unavailable(pt having surgery at this time, will continue efforts)   Macario Golds 05/06/2019, 11:25 AM  Kathleen Lime, MS [facility] SLP [place] Office [phone]

## 2019-05-06 NOTE — Progress Notes (Signed)
PROGRESS NOTE    [name]  PXT:[id number] DOB: 1935/11/24 DOA: 05/05/2019 PCP: Chevis Pretty, FNP   Brief Narrative: Veronica Paul is a 84 y.o.  female with medical history of atrial fibrillation on Eliquis, diabetes mellitus, hypertension, lung cancer. Patient was planned for a colonoscopy when she was found to be in atrial fibrillation with RVR. She was started on a diltiazem drip.   Assessment & Plan:   Principal Problem:   Atrial fibrillation with RVR (HCC) Active Problems:   Hypertension   Diabetes mellitus without complication (HCC)   Hypothyroidism   Abdominal pain   Atrial fibrillation with RVR Possibly secondary patient not receiving medication. Did not receive home metoprolol or Cardizem overnight. Managed on diltiazem drip -Continue home metoprolol XL and Cardizem CD -Wean off cardizem drip  Essential hypertension -Continue home metoprolol and Cardizem as mentioned above  Diabetes mellitus, type 2 Patient is on metformin as an outpatient -Continue SSI  Hypothyroidism -Continue Synthroid  Abdominal pain Colonoscopy today per GI.   DVT prophylaxis: Lovenox Code Status:   Code Status: Full Code Family Communication: None at bedside Disposition Plan: Discharge likely in 24 hours if rate controlled   Consultants:   [facility]  Procedures:   Colonoscopy  Antimicrobials:  None    Subjective: No chest pain or dyspnea  Objective: Vitals:   05/06/19 0800 05/06/19 0810 05/06/19 0900 05/06/19 1129  BP: (!) 144/77  (!) 155/106 133/74  Pulse: 89  (!) 106 (!) 107  Resp: 19  (!) 24 (!) 22  Temp:  97.8 F (36.6 C)  98.7 F (37.1 C)  TempSrc:  Axillary  Oral  SpO2: 99%  99% 98%  Weight:    76.8 kg  Height:    5\' 3"  (1.6 m)    Intake/Output Summary (Last 24 hours) at 05/06/2019 1234 Last data filed at 05/06/2019 0600 Gross per 24 hour  Intake 717.31 ml  Output --  Net 717.31 ml   Filed Weights   05/06/19 0327 05/06/19 1129   Weight: 76.8 kg 76.8 kg    Examination:  General exam: Appears calm and comfortable Respiratory system: Clear to auscultation. Respiratory effort normal. Cardiovascular system: S1 & S2 heard, irregular rhythm with fast rate. No murmurs, rubs, gallops or clicks. Gastrointestinal system: Abdomen is nondistended, soft and nontender. No organomegaly or masses felt. Normal bowel sounds heard. Central nervous system: Alert and oriented. No focal neurological deficits. Extremities: No edema. No calf tenderness Skin: No cyanosis. No rashes Psychiatry: Judgement and insight appear normal. Mood & affect appropriate.     Data Reviewed: I have personally reviewed following labs and imaging studies  CBC: Recent Labs  Lab 05/05/19 1220 05/06/19 0552  WBC 10.7* 9.3  NEUTROABS 8.1*  --   HGB 14.5 14.0  HCT 45.9 44.3  MCV 92.9 92.1  PLT 251 627   Basic Metabolic Panel: Recent Labs  Lab 05/05/19 1220 05/06/19 0552  NA 144 143  K 3.5 3.0*  CL 106 108  CO2 24 25  GLUCOSE 124* 108*  BUN 8 6*  CREATININE 0.89 0.61  CALCIUM 8.7* 8.4*   GFR: Estimated Creatinine Clearance: 54.2 mL/min (by C-G formula based on SCr of 0.61 mg/dL). Liver Function Tests: Recent Labs  Lab 05/05/19 1220  AST 20  ALT 19  ALKPHOS 55  BILITOT 0.7  PROT 7.5  ALBUMIN 4.2   No results for input(s): LIPASE, AMYLASE in the last 168 hours. No results for input(s): AMMONIA in the last 168 hours.  Coagulation Profile: No results for input(s): INR, PROTIME in the last 168 hours. Cardiac Enzymes: No results for input(s): CKTOTAL, CKMB, CKMBINDEX, TROPONINI in the last 168 hours. BNP (last 3 results) No results for input(s): PROBNP in the last 8760 hours. HbA1C: No results for input(s): HGBA1C in the last 72 hours. CBG: Recent Labs  Lab 05/05/19 1708 05/05/19 2338 05/06/19 0723 05/06/19 1139  GLUCAP 73 87 99 106*   Lipid Profile: No results for input(s): CHOL, HDL, LDLCALC, TRIG, CHOLHDL, LDLDIRECT  in the last 72 hours. Thyroid Function Tests: Recent Labs    05/05/19 1409 05/05/19 1420  TSH  --  0.645  FREET4 1.28*  --    Anemia Panel: No results for input(s): VITAMINB12, FOLATE, FERRITIN, TIBC, IRON, RETICCTPCT in the last 72 hours. Sepsis Labs: No results for input(s): PROCALCITON, LATICACIDVEN in the last 168 hours.  Recent Results (from the past 240 hour(s))  SARS CORONAVIRUS 2 (TAT 6-24 HRS) Nasopharyngeal Nasopharyngeal Swab     Status: None   Collection Time: 05/05/19  1:34 PM   Specimen: Nasopharyngeal Swab  Result Value Ref Range Status   SARS Coronavirus 2 NEGATIVE NEGATIVE Final    Comment: (NOTE) SARS-CoV-2 target nucleic acids are NOT DETECTED. The SARS-CoV-2 RNA is generally detectable in upper and lower respiratory specimens during the acute phase of infection. Negative results do not preclude SARS-CoV-2 infection, do not rule out co-infections with other pathogens, and should not be used as the sole basis for treatment or other patient management decisions. Negative results must be combined with clinical observations, patient history, and epidemiological information. The expected result is Negative. Fact Sheet for Patients: SugarRoll.be Fact Sheet for Healthcare Providers: https://www.woods-mathews.com/ This test is not yet approved or cleared by the [place] FDA and  has been authorized for detection and/or diagnosis of SARS-CoV-2 by FDA under an Emergency Use Authorization (EUA). This EUA will remain  in effect (meaning this test can be used) for the duration of the COVID-19 declaration under Section 56 4(b)(1) of the Act, 21 U.S.C. section 360bbb-3(b)(1), unless the authorization is terminated or revoked sooner. Performed at [facility], [place] [address], [place], [place] [address]   MRSA PCR Screening     Status: None   Collection Time: 05/06/19  3:29 AM   Specimen: Nasal Mucosa; Nasopharyngeal    Result Value Ref Range Status   MRSA by PCR NEGATIVE NEGATIVE Final    Comment:        The GeneXpert MRSA Assay (FDA approved for NASAL specimens only), is one component of a comprehensive MRSA colonization surveillance program. It is not intended to diagnose MRSA infection nor to guide or monitor treatment for MRSA infections. Performed at [facility], [place] [address], [place], [place] [address]          Radiology Studies: DG Chest Portable 1 View  Result Date: 05/05/2019 CLINICAL DATA:  Atrial fibrillation EXAM: PORTABLE CHEST 1 VIEW COMPARISON:  04/14/2019 FINDINGS: Mild cardiomegaly, stable. No focal airspace consolidation, pleural effusion, or pneumothorax. No acute osseous findings. IMPRESSION: Stable mild cardiomegaly. No acute cardiopulmonary findings. Electronically Signed   By: Davina Poke D.O.   On: 05/05/2019 12:42        Scheduled Meds: . [MAR Hold] Chlorhexidine Gluconate Cloth  6 each Topical Daily  . [MAR Hold] diltiazem  360 mg Oral Daily  . [MAR Hold] insulin aspart  0-15 Units Subcutaneous TID WC  . [MAR Hold] insulin aspart  0-5 Units Subcutaneous QHS  . [  MAR Hold] levothyroxine  75 mcg Oral Daily  . [MAR Hold] mouth rinse  15 mL Mouth Rinse BID  . [MAR Hold] metoprolol  300 mg Oral Daily   Continuous Infusions: . sodium chloride 20 mL/hr at 05/06/19 0848  . diltiazem (CARDIZEM) infusion 15 mg/hr (05/06/19 2241)     LOS: 0 days     Cordelia Poche, MD [facility] 05/06/2019, 12:34 PM  If 7PM-7AM, please contact night-coverage [email or web address]

## 2019-05-06 NOTE — Plan of Care (Signed)
New onset of A-fib, controlled on Cardizem @ 15, with VS stable

## 2019-05-06 NOTE — Anesthesia Postprocedure Evaluation (Signed)
Anesthesia Post Note  Patient: Veronica Paul  Procedure(s) Performed: COLONOSCOPY WITH PROPOFOL (N/A ) POLYPECTOMY     Patient location during evaluation: PACU Anesthesia Type: MAC Level of consciousness: awake and alert Pain management: pain level controlled Vital Signs Assessment: post-procedure vital signs reviewed and stable Respiratory status: spontaneous breathing and respiratory function stable Cardiovascular status: stable Postop Assessment: no apparent nausea or vomiting Anesthetic complications: no    Last Vitals:  Vitals:   05/06/19 1255 05/06/19 1300  BP:  (!) 118/59  Pulse: 94 78  Resp: (!) 25 (!) 21  Temp:    SpO2: 98% 98%    Last Pain:  Vitals:   05/06/19 1300  TempSrc:   PainSc: 0-No pain                 Julliette Frentz DANIEL

## 2019-05-06 NOTE — Interval H&P Note (Signed)
History and Physical Interval Note: For colonoscopy this morning with MAC to eval weight loss and abd pain.  Had been scheduled as outpatient, but afib with RVR led to admission. HIGHER THAN BASELINE RISK.The nature of the procedure, as well as the risks, benefits, and alternatives were carefully and thoroughly reviewed with the patient. Ample time for discussion and questions allowed. The patient understood, was satisfied, and agreed to proceed.    05/06/2019 11:32 AM  [name]  has presented today for surgery, with the diagnosis of Abdominal pain and weight loss.  The various methods of treatment have been discussed with the patient and family. After consideration of risks, benefits and other options for treatment, the patient has consented to  Procedure(s): COLONOSCOPY WITH PROPOFOL (N/A) as a surgical intervention.  The patient's history has been reviewed, patient examined, no change in status, stable for surgery.  I have reviewed the patient's chart and labs.  Questions were answered to the patient's satisfaction.     Veronica Paul Veronica Paul

## 2019-05-06 NOTE — Progress Notes (Addendum)
Initial Nutrition Assessment  DOCUMENTATION CODES:   Not applicable  INTERVENTION:  - diet advancement as medically feasible. - will continue to monitor for nutrition-related needs after colonoscopy.   NUTRITION DIAGNOSIS:   Increased nutrient needs related to acute illness as evidenced by estimated needs.  GOAL:   Patient will meet greater than or equal to 90% of their needs  MONITOR:   Diet advancement, Labs, Weight trends  REASON FOR ASSESSMENT:   Malnutrition Screening Tool  ASSESSMENT:   84 y.o. female with medical history of afib on Eliquis, DM, HTN, and lung cancer. She presented for colonoscopy on 3/18 and was found to be in afib with RVR and was sent to [facility]. She was unable to take Cardizem at home on 3/18 because she needs to take it with food and was unable to have any food d/t colonoscopy.  Patient has been NPO since admission. Able to talk with her and her daughter, who is at bedside. She has been having ongoing abdominal pain (described as a "squeezing" sensation) since 08/2018. She eats 3 meals/day and very rarely eats snacks. She has not noticed a decrease in her portion sizes, but her daughter feels she is likely eating less. Patient has progressed to the point of being too weak to prepare meals for herself so her 2 daughters take turns cooking for her. They often prepare an amount that would last them ~3 days, but after a week's time patient is just finishing up or has food that she has to throw away.   Patient denies any chewing issues at baseline. She has trouble swallowing capsule pills, but denies swallowing issues with any other items. She does need to have food in order to take her pills. She reports that pain begins when she gets out of bed in the morning and that it worsens as the day goes on. She takes Tylenol before laying down for bed and notices a decrease/improvement in pain when she lays down. She denies abdominal pain being worsened by PO  intakes.   Daughter reports that in July patient weighed 190-200 lb and that at doctor's appointment last week, she weighed 168 lb. Weight today is 169 lb, weight on 3/12 was 172 lb. This indicates 3 lb weight loss (1.7% body weight) in the past 1 week; significant for time frame.   Per notes: - afib with RVR - DM without complication - abdominal pain with plan for colonoscopy today (3/19)   Labs reviewed; CBG: 99 mg/dl, K: 3 mmol/l, BUN: 6 mg/dl, Ca: 8.4 mg/dl. Medications reviewed; sliding scale novolog, 75 mcg oral synthroid/day.  IVF; NS @ 20 ml/hr.    NUTRITION - FOCUSED PHYSICAL EXAM:  completed; no muscle or fat wasting, mild edema to BLE.   Diet Order:   Diet Order            Diet NPO time specified  Diet effective midnight              EDUCATION NEEDS:   No education needs have been identified at this time  Skin:  Skin Assessment: Reviewed RN Assessment  Last BM:  3/19  Height:   Ht Readings from Last 1 Encounters:  05/06/19 5\' 3"  (1.6 m)    Weight:   Wt Readings from Last 1 Encounters:  05/06/19 76.8 kg    Estimated Nutritional Needs:  Kcal:  1690-1840 kcal Protein:  75-85 grams Fluid:  >/= 2 L/day     Jarome Matin, MS, RD, LDN, CNSC Inpatient  Clinical Dietitian RD pager # available in [place]  After hours/weekend pager # available in [facility]

## 2019-05-06 NOTE — ED Notes (Signed)
ED TO INPATIENT HANDOFF REPORT  ED Nurse Name and Phone #: jon wled   S Name/Age/Gender Veronica Paul 84 y.o. female Room/Bed: WA07/WA07  Code Status   Code Status: Full Code  Home/SNF/Other Home Patient oriented to: self, place, time and situation Is this baseline? Yes   Triage Complete: Triage complete  Chief Complaint New onset a-fib [facility]) [I48.91]  Triage Note Pt was in process of going in for Colonoscopy this morning. Noted to be in A Fib, GI spoke with Cardio whom requested she be sent to the ED for evaluation    Allergies Allergies  Allergen Reactions  . Ace Inhibitors Cough  . Feldene [Piroxicam]     REACTION: RASH TO HANDS  . Meloxicam Nausea And Vomiting  . Prevnar [Pneumococcal 13-Val Conj Vacc]   . Statins Other (See Comments)    myalgia  . Sulfa Antibiotics     Rash   . Zithromax [Azithromycin Dihydrate] Itching    Level of Care/Admitting Diagnosis ED Disposition    ED Disposition Condition Comment   Admit  Hospital Area: [place] [[id number]]  Level of Care: Stepdown [14]  Admit to SDU based on following criteria: Cardiac Instability:  Patients experiencing chest pain, unconfirmed MI and stable, arrhythmias and CHF requiring medical management and potentially compromising patient's stability  Covid Evaluation: Asymptomatic Screening Protocol (No Symptoms)  Diagnosis: New onset a-fib [facility]) [[id number]]  Admitting Physician: [place], [place]  Attending Physician: Debbe Odea [[id number]]       B Medical/Surgery History Past Medical History:  Diagnosis Date  . Allergy    "my nose runs alot"  . Anxiety   . Arthritis   . Atrial fibrillation ([place])   . Blood transfusion without reported diagnosis    "when I had a miscarrage in 1957"  . Cataract    bilateral removed  . Cholelithiasis   . Constipation    x ray showed increased stool in colon- uses Miralax daily and Dulcolax twice a week   . Depression   . Diabetes mellitus    . Dyslipidemia   . HTN (hypertension)   . Hx of long-term (current) use of anticoagulants   . Hyperlipidemia   . Hypertension   . Hypothyroidism   . Lung cancer ([place])   . Obesity   . Osteopenia   . Vitamin D deficiency    Past Surgical History:  Procedure Laterality Date  . CATARACT EXTRACTION Bilateral   . COLONOSCOPY    . LUNG REMOVAL, PARTIAL  1998   Right  . THYROIDECTOMY    . TUBAL LIGATION    . UPPER GASTROINTESTINAL ENDOSCOPY       A IV Location/Drains/Wounds Patient Lines/Drains/Airways Status   Active Line/Drains/Airways    Name:   Placement date:   Placement time:   Site:   Days:   Peripheral IV 05/05/19 Left Antecubital   05/05/19    1224    Antecubital   1          Intake/Output Last 24 hours  Intake/Output Summary (Last 24 hours) at 05/06/2019 0203 Last data filed at 05/05/2019 1610 Gross per 24 hour  Intake 500 ml  Output --  Net 500 ml    Labs/Imaging Results for orders placed or performed during the hospital encounter of 05/05/19 (from the past 48 hour(s))  CBC with Differential/Platelet     Status: Abnormal   Collection Time: 05/05/19 12:20 PM  Result Value Ref Range   WBC 10.7 (H) 4.0 - 10.5 K/uL  RBC 4.94 3.87 - 5.11 MIL/uL   Hemoglobin 14.5 12.0 - 15.0 g/dL   HCT 45.9 36.0 - 46.0 %   MCV 92.9 80.0 - 100.0 fL   MCH 29.4 26.0 - 34.0 pg   MCHC 31.6 30.0 - 36.0 g/dL   RDW 14.4 11.5 - 15.5 %   Platelets 251 150 - 400 K/uL   nRBC 0.0 0.0 - 0.2 %   Neutrophils Relative % 75 %   Neutro Abs 8.1 (H) 1.7 - 7.7 K/uL   Lymphocytes Relative 18 %   Lymphs Abs 1.9 0.7 - 4.0 K/uL   Monocytes Relative 6 %   Monocytes Absolute 0.7 0.1 - 1.0 K/uL   Eosinophils Relative 0 %   Eosinophils Absolute 0.0 0.0 - 0.5 K/uL   Basophils Relative 1 %   Basophils Absolute 0.1 0.0 - 0.1 K/uL   Immature Granulocytes 0 %   Abs Immature Granulocytes 0.03 0.00 - 0.07 K/uL    Comment: Performed at [facility], [place] [address], [place],  [place] [address]  Comprehensive metabolic panel     Status: Abnormal   Collection Time: 05/05/19 12:20 PM  Result Value Ref Range   Sodium 144 135 - 145 mmol/L   Potassium 3.5 3.5 - 5.1 mmol/L   Chloride 106 98 - 111 mmol/L   CO2 24 22 - 32 mmol/L   Glucose, Bld 124 (H) 70 - 99 mg/dL    Comment: Glucose reference range applies only to samples taken after fasting for at least 8 hours.   BUN 8 8 - 23 mg/dL   Creatinine, Ser 0.89 0.44 - 1.00 mg/dL   Calcium 8.7 (L) 8.9 - 10.3 mg/dL   Total Protein 7.5 6.5 - 8.1 g/dL   Albumin 4.2 3.5 - 5.0 g/dL   AST 20 15 - 41 U/L   ALT 19 0 - 44 U/L   Alkaline Phosphatase 55 38 - 126 U/L   Total Bilirubin 0.7 0.3 - 1.2 mg/dL   GFR calc non Af Amer >60 >60 mL/min   GFR calc Af Amer >60 >60 mL/min   Anion gap 14 5 - 15    Comment: Performed at [facility], [place] [address] [place], [place] [address]  Brain natriuretic peptide     Status: Abnormal   Collection Time: 05/05/19 12:20 PM  Result Value Ref Range   B Natriuretic Peptide 690.7 (H) 0.0 - 100.0 pg/mL    Comment: Performed at [facility], [place] [address] [place], [place] [address]  Troponin I (High Sensitivity)     Status: None   Collection Time: 05/05/19 12:20 PM  Result Value Ref Range   Troponin I (High Sensitivity) 10 <18 ng/L    Comment: (NOTE) Elevated high sensitivity troponin I (hsTnI) values and significant  changes across serial measurements may suggest ACS but many other  chronic and acute conditions are known to elevate hsTnI results.  Refer to the "Links" section for chest pain algorithms and additional  guidance. Performed at [facility], [place] [address] [place],  [address]   T4, free     Status: Abnormal   Collection Time: 05/05/19  2:09 PM  Result Value Ref Range   Free T4 1.28 (H) 0.61 - 1.12 ng/dL    Comment: (NOTE) Biotin ingestion may interfere with free T4 tests. If the results are inconsistent with the  TSH level, previous test results, or the clinical presentation, then consider biotin interference. If needed, order  repeat testing after stopping biotin. Performed at [facility], [place] [address], [place], [place] [address]   TSH     Status: None   Collection Time: 05/05/19  2:20 PM  Result Value Ref Range   TSH 0.645 0.350 - 4.500 uIU/mL    Comment: Performed by a 3rd Generation assay with a functional sensitivity of <=0.01 uIU/mL. Performed at [facility], [place] [address] [place], [place] [address]   Troponin I (High Sensitivity)     Status: None   Collection Time: 05/05/19  2:20 PM  Result Value Ref Range   Troponin I (High Sensitivity) 12 <18 ng/L    Comment: (NOTE) Elevated high sensitivity troponin I (hsTnI) values and significant  changes across serial measurements may suggest ACS but many other  chronic and acute conditions are known to elevate hsTnI results.  Refer to the "Links" section for chest pain algorithms and additional  guidance. Performed at [facility], [place] [address], [place], [place] [address]   CBG monitoring, ED     Status: None   Collection Time: 05/05/19  5:08 PM  Result Value Ref Range   Glucose-Capillary 73 70 - 99 mg/dL    Comment: Glucose reference range applies only to samples taken after fasting for at least 8 hours.  CBG monitoring, ED     Status: None   Collection Time: 05/05/19 11:38 PM  Result Value Ref Range   Glucose-Capillary 87 70 - 99 mg/dL    Comment: Glucose reference range applies only to samples taken after fasting for at least 8 hours.   DG Chest Portable 1 View  Result Date: 05/05/2019 CLINICAL DATA:  Atrial fibrillation EXAM: PORTABLE CHEST 1 VIEW COMPARISON:  04/14/2019 FINDINGS: Mild cardiomegaly, stable. No focal airspace consolidation, pleural effusion, or pneumothorax. No acute osseous findings. IMPRESSION: Stable mild cardiomegaly. No acute cardiopulmonary findings.  Electronically Signed   By: Davina Poke D.O.   On: 05/05/2019 12:42    Pending Labs Unresulted Labs (From admission, onward)    Start     Ordered   05/06/19 6160  Basic metabolic panel  Tomorrow morning,   R     05/05/19 1351   05/06/19 0500  CBC  Tomorrow morning,   R     05/05/19 1351   05/05/19 1319  SARS CORONAVIRUS 2 (TAT 6-24 HRS) Nasopharyngeal Nasopharyngeal Swab  (Tier 3 (TAT 6-24 hrs))  Once,   STAT    Question Answer Comment  Is this test for diagnosis or screening Screening   Symptomatic for COVID-19 as defined by CDC No   Hospitalized for COVID-19 No   Admitted to ICU for COVID-19 No   Previously tested for COVID-19 Yes   Resident in a congregate (group) care setting No   Employed in healthcare setting No   Pregnant No      05/05/19 1318          Vitals/Pain Today's Vitals   05/05/19 2245 05/05/19 2300 05/05/19 2315 05/06/19 0159  BP: (!) 145/89 (!) 156/86 (!) 202/86 (!) 123/91  Pulse: 65 95 99 (!) 127  Resp: (!) 21 14 13  (!) 24  Temp:    97.6 F (36.4 C)  TempSrc:    Oral  SpO2: 97% 98% 98% 98%  PainSc:    0-No pain    Isolation Precautions No active isolations  Medications Medications  diltiazem (CARDIZEM) 1 mg/mL load via infusion 10 mg (10 mg Intravenous Bolus from Bag 05/05/19 1233)  And  diltiazem (CARDIZEM) 125 mg in dextrose 5% 125 mL (1 mg/mL) infusion (12 mg/hr Intravenous Rate/Dose Change 05/05/19 2342)  diltiazem (CARDIZEM CD) 24 hr capsule 360 mg (has no administration in time range)  metoprolol succinate (TOPROL-XL) 24 hr tablet 300 mg (has no administration in time range)  levothyroxine (SYNTHROID) tablet 75 mcg (has no administration in time range)  insulin aspart (novoLOG) injection 0-15 Units (0 Units Subcutaneous Not Given 05/05/19 1712)  insulin aspart (novoLOG) injection 0-5 Units (0 Units Subcutaneous Not Given 05/05/19 2339)  sodium chloride 0.9 % bolus 500 mL (0 mLs Intravenous Stopped 05/05/19 1610)  polyethylene glycol  (GoLYTELY) solution 2,000 mL (2,000 mLs Oral Given 05/05/19 2130)    Mobility walks with person assist     Focused Assessments Cardiac Assessment Handoff:  Cardiac Rhythm: Atrial fibrillation No results found for: CKTOTAL, CKMB, CKMBINDEX, TROPONINI No results found for: DDIMER Does the Patient currently have chest pain? no     R Recommendations: See Admitting Provider Note  Report given to:   Additional Notes:

## 2019-05-06 NOTE — Anesthesia Preprocedure Evaluation (Addendum)
Anesthesia Evaluation  Patient identified by MRN, date of birth, ID band Patient awake    Reviewed: Allergy & Precautions, NPO status , Patient's Chart, lab work & pertinent test results  Airway Mallampati: II       Dental  (+) Partial Upper, Partial Lower, Dental Advisory Given   Pulmonary neg pulmonary ROS,    Pulmonary exam normal        Cardiovascular hypertension, + dysrhythmias Atrial Fibrillation  Rhythm:Irregular Rate:Normal   Study Highlights 03/24/19   Normal perfusion No ischemia or scar  EKG with no ST changes to suggest ischemia.  Images not gated due to atrial fibrillatoin.  The study is normal.  This is a low risk study.     Neuro/Psych PSYCHIATRIC DISORDERS Anxiety Depression negative neurological ROS     GI/Hepatic negative GI ROS, Neg liver ROS,   Endo/Other  diabetesHypothyroidism   Renal/GU negative Renal ROS     Musculoskeletal negative musculoskeletal ROS (+)   Abdominal   Peds  Hematology negative hematology ROS (+)   Anesthesia Other Findings Day of surgery medications reviewed with the patient.  Reproductive/Obstetrics                            Anesthesia Physical Anesthesia Plan  ASA: III  Anesthesia Plan: MAC   Post-op Pain Management:    Induction: Intravenous  PONV Risk Score and Plan: Ondansetron and Propofol infusion  Airway Management Planned: Natural Airway  Additional Equipment:   Intra-op Plan:   Post-operative Plan:   Informed Consent: I have reviewed the patients History and Physical, chart, labs and discussed the procedure including the risks, benefits and alternatives for the proposed anesthesia with the patient or authorized representative who has indicated his/her understanding and acceptance.     Dental advisory given  Plan Discussed with: Anesthesiologist and CRNA  Anesthesia Plan Comments:        Anesthesia  Quick Evaluation

## 2019-05-06 NOTE — Op Note (Signed)
[facility] Patient Name: Veronica Paul Procedure Date: 05/06/2019 MRN: [id number] Attending MD: Jerene Bears , MD Date of Birth: Oct 01, 1935 CSN: [id number] Age: 84 Admit Type: Outpatient Procedure:                Colonoscopy Indications:              Lower abdominal pain, Weight loss Providers:                Lajuan Lines. Hilarie Fredrickson, MD, Cleda Daub, RN, Theodora Blow,                            Technician Referring MD:             Estill Cotta. Loletha Carrow, MD Medicines:                Monitored Anesthesia Care Complications:            No immediate complications. Estimated Blood Loss:     Estimated blood loss was minimal. Procedure:                Pre-Anesthesia Assessment:                           - Prior to the procedure, a History and Physical                            was performed, and patient medications and                            allergies were reviewed. The patient's tolerance of                            previous anesthesia was also reviewed. The risks                            and benefits of the procedure and the sedation                            options and risks were discussed with the patient.                            All questions were answered, and informed consent                            was obtained. Prior Anticoagulants: The patient has                            taken Eliquis (apixaban), last dose was 3 days                            prior to procedure. ASA Grade Assessment: III - A                            patient with severe systemic disease. After  reviewing the risks and benefits, the patient was                            deemed in satisfactory condition to undergo the                            procedure.                           After obtaining informed consent, the colonoscope                            was passed under direct vision. Throughout the                            procedure, the patient's blood  pressure, pulse, and                            oxygen saturations were monitored continuously. The                            PCF-H190DL ([id number] Olympus pediatric colonoscope                            was introduced through the anus and advanced to the                            cecum, identified by appendiceal orifice and                            ileocecal valve. The colonoscopy was performed                            without difficulty. The patient tolerated the                            procedure well. The quality of the bowel                            preparation was good. The ileocecal valve,                            appendiceal orifice, and rectum were photographed. Scope In: 11:52:19 AM Scope Out: 12:25:20 PM Scope Withdrawal Time: 0 hours 25 minutes 14 seconds  Total Procedure Duration: 0 hours 33 minutes 1 second  Findings:      The perianal exam findings include a perianal fungal rash.      The digital rectal exam was normal.      A 3 mm polyp was found in the cecum. The polyp was sessile. The polyp       was removed with a cold snare. Resection and retrieval were complete.      Three sessile polyps were found in the ascending colon. The polyps were       4 to 6 mm in size. These polyps were removed with a cold snare.  Resection and retrieval were complete.      A 5 mm polyp was found in the transverse colon. The polyp was sessile.       The polyp was removed with a cold snare. Resection and retrieval were       complete.      Two sessile polyps were found in the descending colon. The polyps were 4       to 5 mm in size. These polyps were removed with a cold snare. Resection       and retrieval were complete.      A 8 mm polyp was found in the sigmoid colon. The polyp was sessile. The       polyp was removed with a cold snare. Resection and retrieval were       complete.      Many small and large-mouthed diverticula were found in the recto-sigmoid        colon, sigmoid colon, distal descending colon, ascending colon and       cecum. There was narrowing of the colon in association with the       diverticular opening in the sigmoid colon.      Internal hemorrhoids were found during retroflexion. The hemorrhoids       were small. Impression:               - Perianal fungal rash found on perianal exam.                           - One 3 mm polyp in the cecum, removed with a cold                            snare. Resected and retrieved.                           - Three 4 to 6 mm polyps in the ascending colon,                            removed with a cold snare. Resected and retrieved.                           - One 5 mm polyp in the transverse colon, removed                            with a cold snare. Resected and retrieved.                           - Two 4 to 5 mm polyps in the descending colon,                            removed with a cold snare. Resected and retrieved.                           - One 8 mm polyp in the sigmoid colon, removed with                            a cold snare. Resected and retrieved.                           -  Severe diverticulosis in the recto-sigmoid colon,                            in the sigmoid colon, in the distal descending                            colon, in the ascending colon and in the cecum.                           - Small internal hemorrhoids. Moderate Sedation:      N/A Recommendation:           - Return patient to hospital ward for possible                            discharge same day.                           - Resume usual diet.                           - Continue present medications.                           - Nystatin ointment three times daily to perianal                            skin for perianal fungal rash.                           - Resume Eliquis (apixaban) at prior dose in 2                            days. Refer to managing physician for further                             adjustment of therapy.                           - Await pathology results.                           - Return to GI office to see Dr. Loletha Carrow at                            appointment to be scheduled.                           - No repeat colonoscopy for screening/surveillance                            due to age. Procedure Code(s):        --- Professional ---                           [phone], Colonoscopy, flexible; with removal of  tumor(s), polyp(s), or other lesion(s) by snare                            technique Diagnosis Code(s):        --- Professional ---                           B35.6, Tinea cruris                           K63.5, Polyp of colon                           K64.8, Other hemorrhoids                           R10.30, Lower abdominal pain, unspecified                           R63.4, Abnormal weight loss                           K57.30, Diverticulosis of large intestine without                            perforation or abscess without bleeding CPT copyright 2019 American Medical Association. All rights reserved. The codes documented in this report are preliminary and upon coder review may  be revised to meet current compliance requirements. Jerene Bears, MD 05/06/2019 12:41:37 PM This report has been signed electronically. Number of Addenda: 0

## 2019-05-07 DIAGNOSIS — R338 Other retention of urine: Secondary | ICD-10-CM

## 2019-05-07 DIAGNOSIS — I10 Essential (primary) hypertension: Secondary | ICD-10-CM | POA: Diagnosis not present

## 2019-05-07 DIAGNOSIS — E119 Type 2 diabetes mellitus without complications: Secondary | ICD-10-CM | POA: Diagnosis not present

## 2019-05-07 DIAGNOSIS — R300 Dysuria: Secondary | ICD-10-CM | POA: Diagnosis not present

## 2019-05-07 DIAGNOSIS — I4891 Unspecified atrial fibrillation: Secondary | ICD-10-CM | POA: Diagnosis not present

## 2019-05-07 DIAGNOSIS — E039 Hypothyroidism, unspecified: Secondary | ICD-10-CM | POA: Diagnosis not present

## 2019-05-07 LAB — GLUCOSE, CAPILLARY
Glucose-Capillary: 122 mg/dL — ABNORMAL HIGH (ref 70–99)
Glucose-Capillary: 95 mg/dL (ref 70–99)
Glucose-Capillary: 97 mg/dL (ref 70–99)
Glucose-Capillary: 98 mg/dL (ref 70–99)
Glucose-Capillary: 103 mg/dL — ABNORMAL HIGH (ref 70–99)
Glucose-Capillary: 110 mg/dL — ABNORMAL HIGH (ref 70–99)

## 2019-05-07 LAB — URINALYSIS, ROUTINE W REFLEX MICROSCOPIC
Bacteria, UA: NONE SEEN
Bilirubin Urine: NEGATIVE
Glucose, UA: NEGATIVE mg/dL
Ketones, ur: 20 mg/dL — AB
Leukocytes,Ua: NEGATIVE
Nitrite: NEGATIVE
Protein, ur: NEGATIVE mg/dL
Specific Gravity, Urine: 1.005 (ref 1.005–1.030)
pH: 6 (ref 5.0–8.0)

## 2019-05-07 LAB — POTASSIUM: Potassium: 3.8 mmol/L (ref 3.5–5.1)

## 2019-05-07 MED ORDER — SODIUM CHLORIDE 0.9 % IV SOLN
1.0000 g | INTRAVENOUS | Status: DC
  Administered 2019-05-07 – 2019-05-08 (×2): 1 g via INTRAVENOUS
  Filled 2019-05-07 (×2): qty 1

## 2019-05-07 NOTE — Progress Notes (Signed)
SLP Cancellation Note  Patient Details Name: Veronica Paul MRN: [id number] DOB: Dec 12, 1935   Cancelled treatment:       Reason Eval/Treat Not Completed: SLP screened, no needs identified, will sign off. Patient had colonoscopy yesterday (3/19) and is currently on a regular solids, thin liquids diet. Per RN, patient has not been exhibiting any s/s dysphagia. SLP to sign off at this time but please reorder if needs arise. Thank you for this referral!   Sonia Baller, MA, CCC-SLP Speech Therapy [facility]

## 2019-05-07 NOTE — Progress Notes (Signed)
PROGRESS NOTE    Veronica Paul  PQZ:[id number] DOB: [birth year] DOA: 05/05/2019 PCP: Chevis Pretty, FNP   Brief Narrative: Veronica Paul is a 84 y.o.  female with medical history of atrial fibrillation on Eliquis, diabetes mellitus, hypertension, lung cancer. Patient was planned for a colonoscopy when she was found to be in atrial fibrillation with RVR. She was started on a diltiazem drip.   Assessment & Plan:   Principal Problem:   Atrial fibrillation with RVR (HCC) Active Problems:   Hypertension   Diabetes mellitus without complication (HCC)   Hypothyroidism   Abdominal pain   Benign neoplasm of cecum   Benign neoplasm of ascending colon   Benign neoplasm of transverse colon   Benign neoplasm of descending colon   Benign neoplasm of sigmoid colon   Atrial fibrillation with RVR Possibly secondary patient not receiving medication. Did not receive home metoprolol or Cardizem overnight. Managed on diltiazem drip -Continue home metoprolol XL and Cardizem CD -Wean off cardizem drip  Essential hypertension -Continue home metoprolol and Cardizem as mentioned above  Urinary retention Possibly acute. Patient states she has not had urinary issues in the past. She has not received medications that likely contributed to retention. She has associated dysuria and incontinence (likely overflow). At bedside, ordered a bladder scan which was significant for >600 mL for which the patient could not fully urinate out. In/out catheterization performed -Urine culture/urinalysis -Ceftriaxone 1g IV -Watch UOP for possible need for repeat in/out vs foley catheter placement  Diabetes mellitus, type 2 Patient is on metformin as an outpatient -Continue SSI  Hypothyroidism -Continue Synthroid  Abdominal pain Colonoscopy performed on 3/19 and significant for multiple polyps, diverticulosis and an internal hemorrhoid. She will need outpatient follow-up with Dr. Loletha Carrow   DVT  prophylaxis: SCDs. Resume Eliquis in 2 days post colonoscopy Code Status:   Code Status: Full Code Family Communication: Called daughter on telephone Disposition Plan: Discharge today if able to urinate. If unable to urinate, will transfer to the floor   Consultants:    GI  Procedures:   Colonoscopy  Antimicrobials:  Ceftriaxone   Subjective: Incontinence today.  Objective: Vitals:   05/07/19 0200 05/07/19 0306 05/07/19 0332 05/07/19 0425  BP: 121/71 140/69  (!) 159/102  Pulse: 84 75  90  Resp: 15 (!) 29  (!) 25  Temp:   98.2 F (36.8 C)   TempSrc:   Oral   SpO2: 92% 94%  95%  Weight:      Height:        Intake/Output Summary (Last 24 hours) at 05/07/2019 0925 Last data filed at 05/06/2019 2314 Gross per 24 hour  Intake 1068.41 ml  Output 350 ml  Net 718.41 ml   Filed Weights   05/06/19 0327 05/06/19 1129  Weight: 76.8 kg 76.8 kg    Examination:  General exam: Appears calm and comfortable  Respiratory system: Clear to auscultation. Respiratory effort normal. Cardiovascular system: S1 & S2 heard, Irregular rhythm with normal rate. Gastrointestinal system: Abdomen is nondistended, soft and nontender. Normal bowel sounds heard. Suprapubic mass (likely distended bladder) felt on palpation Central nervous system: Alert and oriented. No focal neurological deficits. Extremities: No edema. No calf tenderness Skin: No cyanosis. No rashes Psychiatry: Judgement and insight appear normal. Mood & affect appropriate.     Data Reviewed: I have personally reviewed following labs and imaging studies  CBC: Recent Labs  Lab 05/05/19 1220 05/06/19 0552  WBC 10.7* 9.3  NEUTROABS 8.1*  --  HGB 14.5 14.0  HCT 45.9 44.3  MCV 92.9 92.1  PLT 251 536   Basic Metabolic Panel: Recent Labs  Lab 05/05/19 1220 05/06/19 0552 05/07/19 0139  NA 144 143  --   K 3.5 3.0* 3.8  CL 106 108  --   CO2 24 25  --   GLUCOSE 124* 108*  --   BUN 8 6*  --   CREATININE  0.89 0.61  --   CALCIUM 8.7* 8.4*  --    GFR: Estimated Creatinine Clearance: 54.2 mL/min (by C-G formula based on SCr of 0.61 mg/dL). Liver Function Tests: Recent Labs  Lab 05/05/19 1220  AST 20  ALT 19  ALKPHOS 55  BILITOT 0.7  PROT 7.5  ALBUMIN 4.2   No results for input(s): LIPASE, AMYLASE in the last 168 hours. No results for input(s): AMMONIA in the last 168 hours. Coagulation Profile: No results for input(s): INR, PROTIME in the last 168 hours. Cardiac Enzymes: No results for input(s): CKTOTAL, CKMB, CKMBINDEX, TROPONINI in the last 168 hours. BNP (last 3 results) No results for input(s): PROBNP in the last 8760 hours. HbA1C: No results for input(s): HGBA1C in the last 72 hours. CBG: Recent Labs  Lab 05/06/19 1940 05/06/19 2151 05/06/19 2304 05/07/19 0330 05/07/19 0806  GLUCAP 124* 104* 88 95 97   Lipid Profile: No results for input(s): CHOL, HDL, LDLCALC, TRIG, CHOLHDL, LDLDIRECT in the last 72 hours. Thyroid Function Tests: Recent Labs    05/05/19 1409 05/05/19 1420  TSH  --  0.645  FREET4 1.28*  --    Anemia Panel: No results for input(s): VITAMINB12, FOLATE, FERRITIN, TIBC, IRON, RETICCTPCT in the last 72 hours. Sepsis Labs: No results for input(s): PROCALCITON, LATICACIDVEN in the last 168 hours.  Recent Results (from the past 240 hour(s))  SARS CORONAVIRUS 2 (TAT 6-24 HRS) Nasopharyngeal Nasopharyngeal Swab     Status: None   Collection Time: 05/05/19  1:34 PM   Specimen: Nasopharyngeal Swab  Result Value Ref Range Status   SARS Coronavirus 2 NEGATIVE NEGATIVE Final    Comment: (NOTE) SARS-CoV-2 target nucleic acids are NOT DETECTED. The SARS-CoV-2 RNA is generally detectable in upper and lower respiratory specimens during the acute phase of infection. Negative results do not preclude SARS-CoV-2 infection, do not rule out co-infections with other pathogens, and should not be used as the sole basis for treatment or other patient management  decisions. Negative results must be combined with clinical observations, patient history, and epidemiological information. The expected result is Negative. Fact Sheet for Patients: SugarRoll.be Fact Sheet for Healthcare Providers: https://www.woods-mathews.com/ This test is not yet approved or cleared by the [place] FDA and  has been authorized for detection and/or diagnosis of SARS-CoV-2 by FDA under an Emergency Use Authorization (EUA). This EUA will remain  in effect (meaning this test can be used) for the duration of the COVID-19 declaration under Section 56 4(b)(1) of the Act, 21 U.S.C. section 360bbb-3(b)(1), unless the authorization is terminated or revoked sooner. Performed at [facility], [place] [address] [place], [place] [address]   MRSA PCR Screening     Status: None   Collection Time: 05/06/19  3:29 AM   Specimen: Nasal Mucosa; Nasopharyngeal  Result Value Ref Range Status   MRSA by PCR NEGATIVE NEGATIVE Final    Comment:        The GeneXpert MRSA Assay (FDA approved for NASAL specimens only), is one component of a comprehensive MRSA colonization surveillance program. It is  not intended to diagnose MRSA infection nor to guide or monitor treatment for MRSA infections. Performed at [facility], [place] [address], [place], [place] [address]          Radiology Studies: DG Chest Portable 1 View  Result Date: 05/05/2019 CLINICAL DATA:  Atrial fibrillation EXAM: PORTABLE CHEST 1 VIEW COMPARISON:  04/14/2019 FINDINGS: Mild cardiomegaly, stable. No focal airspace consolidation, pleural effusion, or pneumothorax. No acute osseous findings. IMPRESSION: Stable mild cardiomegaly. No acute cardiopulmonary findings. Electronically Signed   By: Davina Poke D.O.   On: 05/05/2019 12:42        Scheduled Meds: . Chlorhexidine Gluconate Cloth  6 each Topical Daily  . diltiazem  360 mg Oral Daily  .  insulin aspart  0-15 Units Subcutaneous TID WC  . insulin aspart  0-5 Units Subcutaneous QHS  . levothyroxine  75 mcg Oral Daily  . mouth rinse  15 mL Mouth Rinse BID  . metoprolol  300 mg Oral Daily  . nystatin ointment   Topical BID   Continuous Infusions: . diltiazem (CARDIZEM) infusion Stopped (05/06/19 1835)     LOS: 0 days     Cordelia Poche, MD [facility] 05/07/2019, 9:25 AM  If 7PM-7AM, please contact night-coverage [email or web address]

## 2019-05-08 DIAGNOSIS — R338 Other retention of urine: Secondary | ICD-10-CM | POA: Diagnosis not present

## 2019-05-08 DIAGNOSIS — E119 Type 2 diabetes mellitus without complications: Secondary | ICD-10-CM | POA: Diagnosis not present

## 2019-05-08 DIAGNOSIS — E039 Hypothyroidism, unspecified: Secondary | ICD-10-CM | POA: Diagnosis not present

## 2019-05-08 DIAGNOSIS — I10 Essential (primary) hypertension: Secondary | ICD-10-CM | POA: Diagnosis not present

## 2019-05-08 DIAGNOSIS — R300 Dysuria: Secondary | ICD-10-CM | POA: Diagnosis not present

## 2019-05-08 DIAGNOSIS — N3289 Other specified disorders of bladder: Secondary | ICD-10-CM | POA: Diagnosis not present

## 2019-05-08 DIAGNOSIS — I4891 Unspecified atrial fibrillation: Secondary | ICD-10-CM | POA: Diagnosis not present

## 2019-05-08 LAB — GLUCOSE, CAPILLARY
Glucose-Capillary: 84 mg/dL (ref 70–99)
Glucose-Capillary: 124 mg/dL — ABNORMAL HIGH (ref 70–99)

## 2019-05-08 LAB — URINE CULTURE: Culture: NO GROWTH

## 2019-05-08 MED ORDER — BETHANECHOL CHLORIDE 25 MG PO TABS: 25.0000 mg | ORAL_TABLET | Freq: Three times a day (TID) | ORAL | 0 refills | Status: AC

## 2019-05-08 NOTE — Consult Note (Signed)
Urology Consult   Physician requesting consult: Cordelia Poche, MD  Reason for consult: Urinary retention  History of Present Illness: Veronica Paul is a 84 y.o. female without prior urologic history was admitted following colonoscopy done 2 days ago for atrial fibrillation with rapid ventricular response.  She does have a prior history of this.  While in the hospital, she has had decreased urine output.  She has had residual urine volumes which have been fairly significant.  Apparently, with her daughter in the room, she states that residual urine volumes are now down to 120 mL.  Prior to her colonoscopy, she denies significant issues with urination.  She typically had a good stream.  She felt like she empties well most of the time.  The last day or 2 prior to her hospitalization she did have nocturia x2-3.  She does not have incontinence.  She does not wear pads.  She does not have urinary tract infections.  She does have baseline constipation but is on MiraLAX daily and takes Dulcolax a couple of times a week.  She is diabetic.    Past Medical History:  Diagnosis Date  . Allergy    "my nose runs alot"  . Anxiety   . Arthritis   . Atrial fibrillation ([place])   . Blood transfusion without reported diagnosis    "when I had a miscarrage in 1957"  . Cataract    bilateral removed  . Cholelithiasis   . Constipation    x ray showed increased stool in colon- uses Miralax daily and Dulcolax twice a week   . Depression   . Diabetes mellitus   . Dyslipidemia   . HTN (hypertension)   . Hx of long-term (current) use of anticoagulants   . Hyperlipidemia   . Hypertension   . Hypothyroidism   . Lung cancer ([place])   . Obesity   . Osteopenia   . Vitamin D deficiency     Past Surgical History:  Procedure Laterality Date  . CATARACT EXTRACTION Bilateral   . COLONOSCOPY    . LUNG REMOVAL, PARTIAL  1998   Right  . THYROIDECTOMY    . TUBAL LIGATION    . UPPER GASTROINTESTINAL ENDOSCOPY        Current Hospital Medications: Scheduled Meds: . diltiazem  360 mg Oral Daily  . insulin aspart  0-15 Units Subcutaneous TID WC  . insulin aspart  0-5 Units Subcutaneous QHS  . levothyroxine  75 mcg Oral Daily  . mouth rinse  15 mL Mouth Rinse BID  . metoprolol  300 mg Oral Daily  . nystatin ointment   Topical BID   Continuous Infusions: . cefTRIAXone (ROCEPHIN)  IV 1 g (05/08/19 0848)  . diltiazem (CARDIZEM) infusion Stopped (05/06/19 1835)   PRN Meds:.  Allergies:  Allergies  Allergen Reactions  . Ace Inhibitors Cough  . Feldene [Piroxicam]     REACTION: RASH TO HANDS  . Meloxicam Nausea And Vomiting  . Prevnar [Pneumococcal 13-Val Conj Vacc]   . Statins Other (See Comments)    myalgia  . Sulfa Antibiotics     Rash   . Zithromax [Azithromycin Dihydrate] Itching    Family History  Problem Relation Age of Onset  . Coronary artery disease Brother 52  . Hypertension Brother   . Heart disease Brother   . Hypertension Mother   . Hypertension Father   . Heart disease Father   . Hypertension Sister   . Heart disease Sister   . Cancer Sister   .  Arthritis Daughter   . COPD Daughter   . Fibromyalgia Daughter   . Ulcerative colitis Son   . Stroke Maternal Grandmother   . Cancer Brother   . Lung cancer Brother   . Hypertension Sister   . Cancer Sister   . Colon cancer Neg Hx   . Food intolerance Neg Hx   . Esophageal cancer Neg Hx   . Rectal cancer Neg Hx   . Stomach cancer Neg Hx   . Colon polyps Neg Hx     Social History:  reports that she has never smoked. She has never used smokeless tobacco. She reports that she does not drink alcohol or use drugs.  ROS: A complete review of systems was performed.  All systems are negative except for pertinent findings as noted.  She denies neuropathies.  Physical Exam:  Vital signs in last 24 hours: Temp:  [98.1 F (36.7 C)-98.3 F (36.8 C)] 98.1 F (36.7 C) (03/21 0526) Pulse Rate:  [49-91] 65 (03/21  0526) Resp:  [16-27] 20 (03/21 0526) BP: (120-157)/(46-90) 157/83 (03/21 0526) SpO2:  [93 %-98 %] 96 % (03/21 0526) General:  Alert and oriented, No acute distress HEENT: Normocephalic, atraumatic Neck: No JVD or lymphadenopathy Lungs: Normal inspiratory and expiratory excursion Abdomen: Soft, nontender, nondistended, no abdominal masses Extremities: No edema Neurologic: Grossly intact  Laboratory Data:  Recent Labs    05/05/19 1220 05/06/19 0552  WBC 10.7* 9.3  HGB 14.5 14.0  HCT 45.9 44.3  PLT 251 228    Recent Labs    05/05/19 1220 05/06/19 0552 05/07/19 0139  NA 144 143  --   K 3.5 3.0* 3.8  CL 106 108  --   GLUCOSE 124* 108*  --   BUN 8 6*  --   CALCIUM 8.7* 8.4*  --   CREATININE 0.89 0.61  --      Results for orders placed or performed during the hospital encounter of 05/05/19 (from the past 24 hour(s))  Glucose, capillary     Status: None   Collection Time: 05/07/19 11:34 AM  Result Value Ref Range   Glucose-Capillary 98 70 - 99 mg/dL   Comment 1 Notify RN    Comment 2 Document in Chart   Glucose, capillary     Status: Abnormal   Collection Time: 05/07/19  4:25 PM  Result Value Ref Range   Glucose-Capillary 103 (H) 70 - 99 mg/dL   Comment 1 Notify RN    Comment 2 Document in Chart   Glucose, capillary     Status: Abnormal   Collection Time: 05/07/19  9:11 PM  Result Value Ref Range   Glucose-Capillary 110 (H) 70 - 99 mg/dL  Glucose, capillary     Status: None   Collection Time: 05/08/19  7:23 AM  Result Value Ref Range   Glucose-Capillary 84 70 - 99 mg/dL   Recent Results (from the past 240 hour(s))  SARS CORONAVIRUS 2 (TAT 6-24 HRS) Nasopharyngeal Nasopharyngeal Swab     Status: None   Collection Time: 05/05/19  1:34 PM   Specimen: Nasopharyngeal Swab  Result Value Ref Range Status   SARS Coronavirus 2 NEGATIVE NEGATIVE Final    Comment: (NOTE) SARS-CoV-2 target nucleic acids are NOT DETECTED. The SARS-CoV-2 RNA is generally detectable in  upper and lower respiratory specimens during the acute phase of infection. Negative results do not preclude SARS-CoV-2 infection, do not rule out co-infections with other pathogens, and should not be used as the sole basis for treatment  or other patient management decisions. Negative results must be combined with clinical observations, patient history, and epidemiological information. The expected result is Negative. Fact Sheet for Patients: SugarRoll.be Fact Sheet for Healthcare Providers: https://www.woods-mathews.com/ This test is not yet approved or cleared by the [place] FDA and  has been authorized for detection and/or diagnosis of SARS-CoV-2 by FDA under an Emergency Use Authorization (EUA). This EUA will remain  in effect (meaning this test can be used) for the duration of the COVID-19 declaration under Section 56 4(b)(1) of the Act, 21 U.S.C. section 360bbb-3(b)(1), unless the authorization is terminated or revoked sooner. Performed at [facility], [place] [address], [place], [place] [address]   MRSA PCR Screening     Status: None   Collection Time: 05/06/19  3:29 AM   Specimen: Nasal Mucosa; Nasopharyngeal  Result Value Ref Range Status   MRSA by PCR NEGATIVE NEGATIVE Final    Comment:        The GeneXpert MRSA Assay (FDA approved for NASAL specimens only), is one component of a comprehensive MRSA colonization surveillance program. It is not intended to diagnose MRSA infection nor to guide or monitor treatment for MRSA infections. Performed at [facility], [place] [address], [place], [place] [address]     Renal Function: Recent Labs    05/05/19 1220 05/06/19 0552  CREATININE 0.89 0.61   Estimated Creatinine Clearance: 54.2 mL/min (by C-G formula based on SCr of 0.61 mg/dL).   Impression/Assessment:  Incomplete bladder emptying, somewhat improved this morning according to the patient's  daughter.  Plan:  At this point, she is not uncomfortable, she feels like she is emptying well.  I would suggest from a urologic standpoint she is okay to go home.  I would also suggest using bethanechol 25 mg every 8 hours for the next 2 to 3 days to augment her bladder emptying.  This can be stopped after 2 to 3 days.  I have given her my card.  If there is further need for urologic follow-up, she will call.

## 2019-05-08 NOTE — Progress Notes (Signed)
Pt did not void or dribble the entire night. Did a bladder scan at about 2330 and an in and out and got 300 ml of urine. Another bladder scan at about 0430 showed 300 ml. Did an in and out at 0545 and got 600 cc out of patient.

## 2019-05-08 NOTE — Plan of Care (Signed)
Pt to d/c home with family. No needs at this time.

## 2019-05-08 NOTE — Discharge Instructions (Addendum)

## 2019-05-08 NOTE — Discharge Summary (Signed)
Physician Discharge Summary  [name]:[id number] DOB: [birth year] DOA: 05/05/2019  PCP: Chevis Pretty, FNP  Admit date: 05/05/2019 Discharge date: 05/08/2019  Admitted From: Home Disposition: Home  Recommendations for Outpatient Follow-up:  1. Follow up with PCP in 1 week 2. Follow up with Urology prn and Gastroenterologist 3. Please follow up on the following pending results: Surgical pathology, urine culture  Home Health: None Equipment/Devices: None  Discharge Condition: Stable CODE STATUS: Full code Diet recommendation: Heart healthy   Brief/Interim Summary:  Admission HPI written by Debbe Odea, MD   Chief Complaint: Sent for atrial fibrillation  HPI: ARIZONA Paul is a 84 y.o. female with medical history of atrial fibrillation on Eliquis, diabetes mellitus, hypertension, lung cancer who was about to undergo a colonoscopy today when it was discovered that she was in atrial fibrillation with RVR and thus she was sent to the hospital.  The patient states that she has been asymptomatic from this.  She has not had any chest pain or dyspnea at rest or with exertion.  No symptoms of dizziness or feeling lightheaded. She is on Cardizem and metoprolol according to her med rec.  She states that she was unable to take her Cardizem this morning as she typically needs to take it with food but could not have food today.  She did take her metoprolol last night which is typically when she takes it. She admits to suffering from abdominal pain for many months now which is why she was due to undergo a colonoscopy today.    Hospital course:  Atrial fibrillation with RVR Possibly secondary patient not receiving medication. Did not receive home metoprolol or Cardizem overnight. Managed on diltiazem drip initially and transitioned to home Cardizem CD 360 mg daily and metoprolol XL 300 mg daily with control of heart rate.  Essential hypertension Continue home metoprolol  and Cardizem as mentioned above  Urinary retention Possibly acute. Patient states she has not had urinary issues in the past. She has not received medications that likely contributed to retention. She has associated dysuria and incontinence (likely overflow). At bedside, ordered a bladder scan which was significant for >600 mL for which the patient could not fully urinate out. In/out catheterization performed. Patient required repeat in/out catheterizations and urology was consulted. Urinalysis not suggestive of UTI but patient did receive 2 doses of Ceftriaxone. Will discontinue antibiotics secondary to normal appearing urinalysis. Urine culture is pending on discharge. Urology recommending outpatient follow-up as needed and bethanechol   Diabetes mellitus, type 2 Patient is on metformin as an outpatient. Continue.  Hypothyroidism Continue Synthroid  Abdominal pain Colonoscopy performed on 3/19 and significant for multiple polyps, diverticulosis and an internal hemorrhoid. She will need outpatient follow-up with Dr. Loletha Paul. Pathology pending on discharge.  Discharge Diagnoses:  Principal Problem:   Atrial fibrillation with RVR ([place]) Active Problems:   Hypertension   Diabetes mellitus without complication (HCC)   Hypothyroidism   Abdominal pain   Benign neoplasm of cecum   Benign neoplasm of ascending colon   Benign neoplasm of transverse colon   Benign neoplasm of descending colon   Benign neoplasm of sigmoid colon    Discharge Instructions   Allergies as of 05/08/2019      Reactions   Ace Inhibitors Cough   Feldene [piroxicam]    REACTION: RASH TO HANDS   Meloxicam Nausea And Vomiting   Prevnar [pneumococcal 13-val Conj Vacc]    Statins Other (See Comments)   myalgia  Sulfa Antibiotics    Rash   Zithromax [azithromycin Dihydrate] Itching      Medication List    STOP taking these medications   acetaminophen 500 MG tablet Commonly known as: TYLENOL     TAKE  these medications   apixaban 5 MG Tabs tablet Commonly known as: Eliquis Take 1 tablet (5 mg total) by mouth 2 (two) times daily.   bethanechol 25 MG tablet Commonly known as: URECHOLINE Take 1 tablet (25 mg total) by mouth 3 (three) times daily for 3 days.   bisacodyl 5 MG EC tablet Commonly known as: DULCOLAX Take 5 mg by mouth daily as needed for moderate constipation. Twice a week - stool softener   clonazePAM 0.5 MG tablet Commonly known as: KLONOPIN Take 1 tablet (0.5 mg total) by mouth 2 (two) times daily as needed. What changed: reasons to take this   diltiazem 360 MG 24 hr capsule Commonly known as: CARDIZEM CD Take 1 capsule (360 mg total) by mouth daily.   fish oil-omega-3 fatty acids 1000 MG capsule Take 1 g by mouth daily.   furosemide 20 MG tablet Commonly known as: LASIX Take 1/2 tablet as needed 3 times weekly What changed:   how much to take  how to take this  when to take this  additional instructions   levothyroxine 75 MCG tablet Commonly known as: SYNTHROID Take 1 tablet (75 mcg total) by mouth daily.   metFORMIN 500 MG tablet Commonly known as: GLUCOPHAGE Take 1 tablet (500 mg total) by mouth daily.   metoprolol 200 MG 24 hr tablet Commonly known as: TOPROL-XL TAKE 1 AND 1/2 TABLET DAILY What changed:   how much to take  how to take this  when to take this  additional instructions   nitroGLYCERIN 0.4 MG SL tablet Commonly known as: NITROSTAT PLACE 1 TABLET (0.4 MG TOTAL) UNDER THE TONGUE EVERY 5 (FIVE) MINUTES AS NEEDED FOR CHEST PAIN.   onetouch ultrasoft lancets Check blood sugars daily Dx E11.9   OneTouch Verio test strip Generic drug: glucose blood Check blood sugars daily Dx E11.9   OneTouch Verio w/Device Kit Check blood sugars daily Dx E11.9   polyethylene glycol 17 g packet Commonly known as: MIRALAX / GLYCOLAX Take 17 g by mouth daily as needed for mild constipation.      Follow-up Information    Chevis Pretty, FNP. Schedule an appointment as soon as possible for a visit in 1 week(s).   Specialty: Family Medicine Why: Hospital follow-up Contact information: [place] [address] [phone]        Franchot Gallo, MD Follow up.   Specialty: Urology Why: As needed for urinary issues Contact information: [place] [address] [phone]        Doran Stabler, MD Follow up.   Specialty: Gastroenterology Why: Abdominal pain Contact information: [address] [place] [address] [phone]          Allergies  Allergen Reactions  . Ace Inhibitors Cough  . Feldene [Piroxicam]     REACTION: RASH TO HANDS  . Meloxicam Nausea And Vomiting  . Prevnar [Pneumococcal 13-Val Conj Vacc]   . Statins Other (See Comments)    myalgia  . Sulfa Antibiotics     Rash   . Zithromax [Azithromycin Dihydrate] Itching    Consultations:  Gastroenterology  Urology   Procedures/Studies: DG Chest 2 View  Result Date: 04/14/2019 CLINICAL DATA:  Shortness of breath.  EXAM: CHEST - 2 VIEW COMPARISON:  11/08/2018. FINDINGS: Mediastinum and hilar structures normal. Cardiomegaly. No pulmonary venous congestion. Stable right lung pleural-parenchymal thickening consistent with scarring. No focal infiltrate. Mild thoracic spine scoliosis and degenerative change. IMPRESSION: 1.  Stable cardiomegaly. 2. Stable right base pleural-parenchymal thickening consistent with scarring. No acute abnormality identified. Electronically Signed   By: Marcello Moores  Register   On: 04/14/2019 13:11   DG Chest Portable 1 View  Result Date: 05/05/2019 CLINICAL DATA:  Atrial fibrillation EXAM: PORTABLE CHEST 1 VIEW COMPARISON:  04/14/2019 FINDINGS: Mild cardiomegaly, stable. No focal airspace consolidation, pleural effusion, or pneumothorax. No acute osseous findings. IMPRESSION: Stable mild cardiomegaly. No acute cardiopulmonary findings. Electronically Signed    By: Davina Poke D.O.   On: 05/05/2019 12:42       Subjective: No issues overnight.  Discharge Exam: Vitals:   05/07/19 2108 05/08/19 0526  BP: 134/62 (!) 157/83  Pulse: (!) 49 65  Resp: 18 20  Temp: 98.3 F (36.8 C) 98.1 F (36.7 C)  SpO2: 96% 96%   Vitals:   05/07/19 1500 05/07/19 1600 05/07/19 2108 05/08/19 0526  BP: 122/61 134/90 134/62 (!) 157/83  Pulse: (!) 56 62 (!) 49 65  Resp: (!) _0 Temp:   98.3 F (36.8 C) 98.1 F (36.7 C)  TempSrc:   Oral Oral  SpO2: 98% 97% 96% 96%  Weight:      Height:        General: Pt is alert, awake, not in acute distress Cardiovascular: Irregular rhythm with normal rate, S1/S2 +, no rubs, no gallops Respiratory: CTA bilaterally, no wheezing, no rhonchi Abdominal: Soft, NT, ND, bowel sounds + Extremities: no edema, no cyanosis    The results of significant diagnostics from this hospitalization (including imaging, microbiology, ancillary and laboratory) are listed below for reference.     Microbiology: Recent Results (from the past 240 hour(s))  SARS CORONAVIRUS 2 (TAT 6-24 HRS) Nasopharyngeal Nasopharyngeal Swab     Status: None   Collection Time: 05/05/19  1:34 PM   Specimen: Nasopharyngeal Swab  Result Value Ref Range Status   SARS Coronavirus 2 NEGATIVE NEGATIVE Final    Comment: (NOTE) SARS-CoV-2 target nucleic acids are NOT DETECTED. The SARS-CoV-2 RNA is generally detectable in upper and lower respiratory specimens during the acute phase of infection. Negative results do not preclude SARS-CoV-2 infection, do not rule out co-infections with other pathogens, and should not be used as the sole basis for treatment or other patient management decisions. Negative results must be combined with clinical observations, patient history, and epidemiological information. The expected result is Negative. Fact Sheet for Patients: SugarRoll.be Fact Sheet for Healthcare  Providers: https://www.woods-mathews.com/ This test is not yet approved or cleared by the [place] FDA and  has been authorized for detection and/or diagnosis of SARS-CoV-2 by FDA under an Emergency Use Authorization (EUA). This EUA will remain  in effect (meaning this test can be used) for the duration of the COVID-19 declaration under Section 56 4(b)(1) of the Act, 21 U.S.C. section 360bbb-3(b)(1), unless the authorization is terminated or revoked sooner. Performed at [facility], [place] [address], [place], [place] [address]   MRSA PCR Screening     Status: None   Collection Time: 05/06/19  3:29 AM   Specimen: Nasal Mucosa; Nasopharyngeal  Result Value Ref Range Status   MRSA by PCR NEGATIVE NEGATIVE Final    Comment:        The GeneXpert MRSA Assay (FDA  approved for NASAL specimens only), is one component of a comprehensive MRSA colonization surveillance program. It is not intended to diagnose MRSA infection nor to guide or monitor treatment for MRSA infections. Performed at [facility], [place] [address], [place], [place] [address]      Labs: BNP (last 3 results) Recent Labs    08/17/18 1232 05/05/19 1220  BNP 402.0* 767.3*   Basic Metabolic Panel: Recent Labs  Lab 05/05/19 1220 05/06/19 0552 05/07/19 0139  NA 144 143  --   K 3.5 3.0* 3.8  CL 106 108  --   CO2 24 25  --   GLUCOSE 124* 108*  --   BUN 8 6*  --   CREATININE 0.89 0.61  --   CALCIUM 8.7* 8.4*  --    Liver Function Tests: Recent Labs  Lab 05/05/19 1220  AST 20  ALT 19  ALKPHOS 55  BILITOT 0.7  PROT 7.5  ALBUMIN 4.2   No results for input(s): LIPASE, AMYLASE in the last 168 hours. No results for input(s): AMMONIA in the last 168 hours. CBC: Recent Labs  Lab 05/05/19 1220 05/06/19 0552  WBC 10.7* 9.3  NEUTROABS 8.1*  --   HGB 14.5 14.0  HCT 45.9 44.3  MCV 92.9 92.1  PLT 251 228   Cardiac Enzymes: No results for input(s): CKTOTAL, CKMB,  CKMBINDEX, TROPONINI in the last 168 hours. BNP: Invalid input(s): POCBNP CBG: Recent Labs  Lab 05/07/19 1134 05/07/19 1625 05/07/19 2111 05/08/19 0723 05/08/19 1147  GLUCAP 98 103* 110* 84 124*   D-Dimer No results for input(s): DDIMER in the last 72 hours. Hgb A1c No results for input(s): HGBA1C in the last 72 hours. Lipid Profile No results for input(s): CHOL, HDL, LDLCALC, TRIG, CHOLHDL, LDLDIRECT in the last 72 hours. Thyroid function studies Recent Labs    05/05/19 1420  TSH 0.645   Anemia work up No results for input(s): VITAMINB12, FOLATE, FERRITIN, TIBC, IRON, RETICCTPCT in the last 72 hours. Urinalysis    Component Value Date/Time   COLORURINE STRAW (A) 05/07/2019 0905   APPEARANCEUR CLEAR 05/07/2019 0905   APPEARANCEUR Clear 09/13/2018 1642   LABSPEC 1.005 05/07/2019 0905   PHURINE 6.0 05/07/2019 0905   GLUCOSEU NEGATIVE 05/07/2019 0905   HGBUR SMALL (A) 05/07/2019 0905   BILIRUBINUR NEGATIVE 05/07/2019 0905   BILIRUBINUR Negative 09/13/2018 1642   KETONESUR 20 (A) 05/07/2019 0905   PROTEINUR NEGATIVE 05/07/2019 0905   UROBILINOGEN negative 05/11/2012 1019   NITRITE NEGATIVE 05/07/2019 0905   LEUKOCYTESUR NEGATIVE 05/07/2019 0905   Sepsis Labs Invalid input(s): PROCALCITONIN,  WBC,  LACTICIDVEN Microbiology Recent Results (from the past 240 hour(s))  SARS CORONAVIRUS 2 (TAT 6-24 HRS) Nasopharyngeal Nasopharyngeal Swab     Status: None   Collection Time: 05/05/19  1:34 PM   Specimen: Nasopharyngeal Swab  Result Value Ref Range Status   SARS Coronavirus 2 NEGATIVE NEGATIVE Final    Comment: (NOTE) SARS-CoV-2 target nucleic acids are NOT DETECTED. The SARS-CoV-2 RNA is generally detectable in upper and lower respiratory specimens during the acute phase of infection. Negative results do not preclude SARS-CoV-2 infection, do not rule out co-infections with other pathogens, and should not be used as the sole basis for treatment or other patient  management decisions. Negative results must be combined with clinical observations, patient history, and epidemiological information. The expected result is Negative. Fact Sheet for Patients: SugarRoll.be Fact Sheet for Healthcare Providers: https://www.woods-mathews.com/ This test is not yet approved or cleared by the  [place] Architectural technologist and  has been authorized for detection and/or diagnosis of SARS-CoV-2 by FDA under an Print production planner (EUA). This EUA will remain  in effect (meaning this test can be used) for the duration of the COVID-19 declaration under Section 56 4(b)(1) of the Act, 21 U.S.C. section 360bbb-3(b)(1), unless the authorization is terminated or revoked sooner. Performed at [facility], [place] [address], [place], [place] [address]   MRSA PCR Screening     Status: None   Collection Time: 05/06/19  3:29 AM   Specimen: Nasal Mucosa; Nasopharyngeal  Result Value Ref Range Status   MRSA by PCR NEGATIVE NEGATIVE Final    Comment:        The GeneXpert MRSA Assay (FDA approved for NASAL specimens only), is one component of a comprehensive MRSA colonization surveillance program. It is not intended to diagnose MRSA infection nor to guide or monitor treatment for MRSA infections. Performed at [facility], [place] [address], [place], [place] [address]     SIGNED:   Cordelia Poche, MD [facility] 05/08/2019, 12:14 PM

## 2019-05-08 NOTE — Plan of Care (Signed)
  Problem: Cardiac: Goal: Ability to achieve and maintain adequate cardiopulmonary perfusion will improve Description: VS on Cardizem stable Outcome: Progressing   Problem: Activity: Goal: Risk for activity intolerance will decrease Outcome: Progressing   Problem: Clinical Measurements: Goal: Respiratory complications will improve Outcome: Progressing   Problem: Clinical Measurements: Goal: Cardiovascular complication will be avoided Outcome: Progressing   Problem: Elimination: Goal: Will not experience complications related to bowel motility Outcome: Progressing

## 2019-05-09 ENCOUNTER — Telehealth: Payer: Self-pay | Admitting: *Deleted

## 2019-05-09 ENCOUNTER — Other Ambulatory Visit: Payer: Self-pay | Admitting: Nurse Practitioner

## 2019-05-09 DIAGNOSIS — K219 Gastro-esophageal reflux disease without esophagitis: Secondary | ICD-10-CM

## 2019-05-09 LAB — SURGICAL PATHOLOGY

## 2019-05-09 NOTE — Telephone Encounter (Signed)
TRANSITIONAL CARE MANAGEMENT TELEPHONE OUTREACH NOTE   Contact Date: 05/09/2019 Contacted By: Eston Mould, LPN   DISCHARGE INFORMATION Date of Discharge:05/08/19 Discharge Facility: [facility] Discharge Diagnosis: Atrial fibrillation with RVR  Outpatient Follow Up Recommendations (copied from discharge summary) 1. Follow up with PCP in 1 week 2. Follow up with Urology prn and Gastroenterologist 3. Please follow up on the following pending results: Surgical pathology, urine culture   Veronica Paul is a female primary care patient of Chevis Pretty, FNP. An outgoing telephone call was made today and I spoke with her daughter Veronica Paul.  Ms. Hechavarria condition(s) and treatment(s) were discussed. An opportunity to ask questions was provided and all were answered or forwarded as appropriate.    ACTIVITIES OF DAILY LIVING  Tenino lives alone but both daughters are taking turns staying the night with her and she can perform ADLs independently. her primary caregiver is herself. she is able to depend on her primary caregiver(s) for consistent help. Transportation to appointments, to pick up medications, and to run errands is not a problem.  (Consider referral to [facility] if transportation or a consistent caregiver is a problem)   Fall Risk Fall Risk  04/29/2019 01/27/2019  Falls in the past year? 0 0  Number falls in past yr: - -    medium [place] Modifications/Assistive Devices Wheelchair: No Cane: No Ramp: No Bedside Toilet: No Hospital Bed:  No Other: [place] she is not receiving home health services.     MEDICATION RECONCILIATION  Ms. Stith has been able to pick-up all prescribed discharge medications from the pharmacy.   A post discharge medication reconciliation was performed and the complete medication list was reviewed with the patient/caregiver and is current as of 05/09/2019. Changes highlighted below.   Discontinued Medications Acetaminophen 500 mg tablet  Current Medication List Allergies as of 05/09/2019      Reactions   Ace Inhibitors Cough   Feldene [piroxicam]    REACTION: RASH TO HANDS   Meloxicam Nausea And Vomiting   Prevnar [pneumococcal 13-val Conj Vacc]    Statins Other (See Comments)   myalgia   Sulfa Antibiotics    Rash   Zithromax [azithromycin Dihydrate] Itching      Medication List       Accurate as of May 09, 2019  3:03 PM. If you have any questions, ask your nurse or doctor.        apixaban 5 MG Tabs tablet Commonly known as: Eliquis Take 1 tablet (5 mg total) by mouth 2 (two) times daily.    bethanechol 25 MG tablet Commonly known as: URECHOLINE Take 1 tablet (25 mg total) by mouth 3 (three) times daily for 3 days.   bisacodyl 5 MG EC tablet Commonly known as: DULCOLAX Take 5 mg by mouth daily as needed for moderate constipation. Twice a week - stool softener   clonazePAM 0.5 MG tablet Commonly known as: KLONOPIN Take 1 tablet (0.5 mg total) by mouth 2 (two) times daily as needed. What changed: reasons to take this   diltiazem 360 MG 24 hr capsule Commonly known as: CARDIZEM CD Take 1 capsule (360 mg total) by mouth daily.   fish oil-omega-3 fatty acids 1000 MG capsule Take 1 g by mouth daily.   furosemide 20 MG tablet Commonly known as: LASIX Take 1/2 tablet as needed 3 times weekly What changed:   how much to take  how to  take this  when to take this  additional instructions   levothyroxine 75 MCG tablet Commonly known as: SYNTHROID Take 1 tablet (75 mcg total) by mouth daily.   metFORMIN 500 MG tablet Commonly known as: GLUCOPHAGE Take 1 tablet (500 mg total) by mouth daily.   metoprolol 200 MG 24 hr tablet Commonly known as: TOPROL-XL TAKE 1 AND 1/2 TABLET DAILY What changed:   how much to take  how to take this  when to take this  additional instructions   nitroGLYCERIN 0.4 MG SL tablet Commonly known as:  NITROSTAT PLACE 1 TABLET (0.4 MG TOTAL) UNDER THE TONGUE EVERY 5 (FIVE) MINUTES AS NEEDED FOR CHEST PAIN.   onetouch ultrasoft lancets Check blood sugars daily Dx E11.9   OneTouch Verio test strip Generic drug: glucose blood Check blood sugars daily Dx E11.9   OneTouch Verio w/Device Kit Check blood sugars daily Dx E11.9   pantoprazole 40 MG tablet Commonly known as: PROTONIX TAKE 1 TABLET BY MOUTH EVERY DAY Started by: Chevis Pretty, FNP   polyethylene glycol 17 g packet Commonly known as: MIRALAX / GLYCOLAX Take 17 g by mouth daily as needed for mild constipation.        PATIENT EDUCATION & FOLLOW-UP PLAN  An appointment for Transitional Care Management is scheduled with Chevis Pretty, FNP on 05/19/19 at 9:00.  Take all medications as prescribed  Contact our office by calling [phone] if you have any questions or concerns  Once you have completed the Bethanechol-if you notice you aren't emptying your bladder or have any urinary issues call [facility] to schedule a follow up appointment.

## 2019-05-10 ENCOUNTER — Encounter: Payer: Self-pay | Admitting: *Deleted

## 2019-05-12 ENCOUNTER — Encounter: Payer: Self-pay | Admitting: Internal Medicine

## 2019-05-18 ENCOUNTER — Encounter: Payer: Self-pay | Admitting: *Deleted

## 2019-05-19 ENCOUNTER — Ambulatory Visit (INDEPENDENT_AMBULATORY_CARE_PROVIDER_SITE_OTHER): Payer: Medicare HMO | Admitting: Nurse Practitioner

## 2019-05-19 ENCOUNTER — Encounter: Payer: Self-pay | Admitting: Nurse Practitioner

## 2019-05-19 ENCOUNTER — Ambulatory Visit (INDEPENDENT_AMBULATORY_CARE_PROVIDER_SITE_OTHER): Payer: Medicare HMO

## 2019-05-19 ENCOUNTER — Other Ambulatory Visit: Payer: Self-pay

## 2019-05-19 VITALS — BP 152/80 | HR 84 | Temp 95.9°F | Resp 20 | Ht 67.0 in | Wt 173.0 lb

## 2019-05-19 DIAGNOSIS — I4891 Unspecified atrial fibrillation: Secondary | ICD-10-CM | POA: Diagnosis not present

## 2019-05-19 DIAGNOSIS — R1084 Generalized abdominal pain: Secondary | ICD-10-CM | POA: Diagnosis not present

## 2019-05-19 DIAGNOSIS — K581 Irritable bowel syndrome with constipation: Secondary | ICD-10-CM

## 2019-05-19 DIAGNOSIS — K582 Mixed irritable bowel syndrome: Secondary | ICD-10-CM | POA: Insufficient documentation

## 2019-05-19 DIAGNOSIS — R109 Unspecified abdominal pain: Secondary | ICD-10-CM | POA: Diagnosis not present

## 2019-05-19 MED ORDER — LINACLOTIDE 72 MCG PO CAPS: 72.0000 ug | ORAL_CAPSULE | Freq: Every day | ORAL | 0 refills | Status: DC

## 2019-05-19 NOTE — Progress Notes (Signed)
Subjective:    Patient ID: Veronica Paul, female    DOB: Jun 23, 1935, 84 y.o.   MRN: [id number]  Today's visit is for Transitional Care Management.  The patient was discharged from [facility] on 05/18/19 with a primary diagnosis of Atrial fib with RVR.   Contact with the patient and/or caregiver, by a clinical staff member, was made on 05/09/19 and was documented as a telephone encounter within the EMR.  Through chart review and discussion with the patient I have determined that management of their condition is of low complexity.   Patient wen [facility] to have colonoscopy on 05/05/19. While being prepared they found her to be in Atrial Fib with RVR. They cancelled colonoscopy and sent her to [place] for work up. Cardiolgy came to see her at [facility]. She was given cardizem which controlled her rate. They did colonoscopy on 05/06/19. She was found to have multiple colon polyps and severe diverticulosis. She was having colonoscopy for abdominal pain that she has been having since last June. We have ran multiple test on her that have all been negative and this was our last ditch effort to see where her abdominal pain is coming from. They do not feel that the diverticulosis was causing her pain. Her pain subsided after she did her bowel prep and had no pain up until 2 days after colonoscopy. No she says the pain is back and is just as bad as it was and is lasting all day. Her daughter who is with her today is wondering if she has IBS. I agree this is a possibility.      Review of Systems  Constitutional: Negative for diaphoresis.  Eyes: Negative for pain.  Respiratory: Negative for shortness of breath.   Cardiovascular: Negative for chest pain, palpitations and leg swelling.  Gastrointestinal: Positive for abdominal pain, constipation and diarrhea.  Endocrine: Negative for polydipsia.  Skin: Negative for rash.  Neurological: Negative for dizziness, weakness and headaches.    Hematological: Does not bruise/bleed easily.  All other systems reviewed and are negative.      Objective:   Physical Exam Vitals and nursing note reviewed.  Constitutional:      Appearance: Normal appearance.  Cardiovascular:     Rate and Rhythm: Normal rate and regular rhythm.     Pulses: Normal pulses.  Pulmonary:     Breath sounds: Normal breath sounds.  Abdominal:     General: Abdomen is flat. Bowel sounds are normal. There is no distension.     Palpations: Abdomen is soft. There is no mass.     Tenderness: There is no abdominal tenderness.  Skin:    General: Skin is warm.  Neurological:     General: No focal deficit present.     Mental Status: She is alert.  Psychiatric:        Mood and Affect: Mood normal.    BP (!) 152/80   Pulse 84   Temp (!) 95.9 F (35.5 C) (Temporal)   Resp 20   Ht 5\' 7"  (1.702 m)   Wt 173 lb (78.5 kg)   LMP 05/11/1992   SpO2 98%   BMI 27.10 kg/m         Assessment & Plan:  Veronica Paul in today with chief complaint of No chief complaint on file.   1. Generalized abdominal pain - DG Abd 1 View; Future  2. Irritable bowel syndrome with  constipation  Increase fiber in diet Follow  up prn - linaclotide (LINZESS) 72 MCG capsule; Take 1 capsule (72 mcg total) by mouth daily before breakfast.  Dispense: 30 capsule; Refill: 0    The above assessment and management plan was discussed with the patient. The patient verbalized understanding of and has agreed to the management plan. Patient is aware to call the clinic if symptoms persist or worsen. Patient is aware when to return to the clinic for a follow-up visit. Patient educated on when it is appropriate to go to the emergency department.   [name], FNP

## 2019-05-19 NOTE — Patient Instructions (Signed)
Diet for Irritable Bowel Syndrome When you have irritable bowel syndrome (IBS), it is very important to eat the foods and follow the eating habits that are best for your condition. IBS may cause various symptoms such as pain in the abdomen, constipation, or diarrhea. Choosing the right foods can help to ease the discomfort from these symptoms. Work with your health care provider and diet and nutrition specialist (dietitian) to find the eating plan that will help to control your symptoms. What are tips for following this plan?      Keep a food diary. This will help you identify foods that cause symptoms. Write down: ? What you eat and when you eat it. ? What symptoms you have. ? When symptoms occur in relation to your meals, such as "pain in abdomen 2 hours after dinner."  Eat your meals slowly and in a relaxed setting.  Aim to eat 5-6 small meals per day. Do not skip meals.  Drink enough fluid to keep your urine pale yellow.  Ask your health care provider if you should take an over-the-counter probiotic to help restore healthy bacteria in your gut (digestive tract). ? Probiotics are foods that contain good bacteria and yeasts.  Your dietitian may have specific dietary recommendations for you based on your symptoms. He or she may recommend that you: ? Avoid foods that cause symptoms. Talk with your dietitian about other ways to get the same nutrients that are in those problem foods. ? Avoid foods with gluten. Gluten is a protein that is found in rye, wheat, and barley. ? Eat more foods that contain soluble fiber. Examples of foods with high soluble fiber include oats, seeds, and certain fruits and vegetables. Take a fiber supplement if directed by your dietitian. ? Reduce or avoid certain foods called FODMAPs. These are foods that contain carbohydrates that are hard to digest. Ask your doctor which foods contain these carbohydrates. What foods are not recommended? The following are some  foods and drinks that may make your symptoms worse:  Fatty foods, such as french fries.  Foods that contain gluten, such as pasta and cereal.  Dairy products, such as milk, cheese, and ice cream.  Chocolate.  Alcohol.  Products with caffeine, such as coffee.  Carbonated drinks, such as soda.  Foods that are high in FODMAPs. These include certain fruits and vegetables.  Products with sweeteners such as honey, high fructose corn syrup, sorbitol, and mannitol. The items listed above may not be a complete list of foods and beverages you should avoid. Contact a dietitian for more information. What foods are good sources of fiber? Your health care provider or dietitian may recommend that you eat more foods that contain fiber. Fiber can help to reduce constipation and other IBS symptoms. Add foods with fiber to your diet a little at a time so your body can get used to them. Too much fiber at one time might cause gas and swelling of your abdomen. The following are some foods that are good sources of fiber:  Berries, such as raspberries, strawberries, and blueberries.  Tomatoes.  Carrots.  Brown rice.  Oats.  Seeds, such as chia and pumpkin seeds. The items listed above may not be a complete list of recommended sources of fiber. Contact your dietitian for more options. Where to find more information  International Foundation for Functional Gastrointestinal Disorders: www.iffgd.[employer] of Diabetes and Digestive and Kidney Diseases: [employer] Summary  When you have irritable bowel syndrome (IBS), it is  very important to eat the foods and follow the eating habits that are best for your condition.  IBS may cause various symptoms such as pain in the abdomen, constipation, or diarrhea.  Choosing the right foods can help to ease the discomfort that comes from symptoms.  Keep a food diary. This will help you identify foods that cause symptoms.  Your health  care provider or diet and nutrition specialist (dietitian) may recommend that you eat more foods that contain fiber. This information is not intended to replace advice given to you by your health care provider. Make sure you discuss any questions you have with your health care provider. Document Revised: 05/26/2018 Document Reviewed: 10/07/2016 Elsevier Patient Education  [place]. Irritable Bowel Syndrome, Adult  Irritable bowel syndrome (IBS) is a group of symptoms that affects the organs responsible for digestion (gastrointestinal or GI tract). IBS is not one specific disease. To regulate how the GI tract works, the body sends signals back and forth between the intestines and the brain. If you have IBS, there may be a problem with these signals. As a result, the GI tract does not function normally. The intestines may become more sensitive and overreact to certain things. This may be especially true when you eat certain foods or when you are under stress. There are four types of IBS. These may be determined based on the consistency of your stool (feces):  IBS with diarrhea.  IBS with constipation.  Mixed IBS.  Unsubtyped IBS. It is important to know which type of IBS you have. Certain treatments are more likely to be helpful for certain types of IBS. What are the causes? The exact cause of IBS is not known. What increases the risk? You may have a higher risk for IBS if you:  Are female.  Are younger than 60.  Have a family history of IBS.  Have a mental health condition, such as depression, anxiety, or post-traumatic stress disorder.  Have had a bacterial infection of your GI tract. What are the signs or symptoms? Symptoms of IBS vary from person to person. The main symptom is abdominal pain or discomfort. Other symptoms usually include one or more of the following:  Diarrhea, constipation, or both.  Abdominal swelling or bloating.  Feeling full after eating a small  or regular-sized meal.  Frequent gas.  Mucus in the stool.  A feeling of having more stool left after a bowel movement. Symptoms tend to come and go. They may be triggered by stress, mental health conditions, or certain foods. How is this diagnosed? This condition may be diagnosed based on a physical exam, your medical history, and your symptoms. You may have tests, such as:  Blood tests.  Stool test.  X-rays.  CT scan.  Colonoscopy. This is a procedure in which your GI tract is viewed with a long, thin, flexible tube. How is this treated? There is no cure for IBS, but treatment can help relieve symptoms. Treatment depends on the type of IBS you have, and may include:  Changes to your diet, such as: ? Avoiding foods that cause symptoms. ? Drinking more water. ? Following a low-FODMAP (fermentable oligosaccharides, disaccharides, monosaccharides, and polyols) diet for up to 6 weeks, or as told by your health care provider. FODMAPs are sugars that are hard for some people to digest. ? Eating more fiber. ? Eating medium-sized meals at the same times every day.  Medicines. These may include: ? Fiber supplements, if you have constipation. ?  Medicine to control diarrhea (antidiarrheal medicines). ? Medicine to help control muscle tightening (spasms) in your GI tract (antispasmodic medicines). ? Medicines to help with mental health conditions, such as antidepressants or tranquilizers.  Talk therapy or counseling.  Working with a diet and nutrition specialist (dietitian) to help create a food plan that is right for you.  Managing your stress. Follow these instructions at home: Eating and drinking  Eat a healthy diet.  Eat medium-sized meals at about the same time every day. Do not eat large meals.  Gradually eat more fiber-rich foods. These include whole grains, fruits, and vegetables. This may be especially helpful if you have IBS with constipation.  Eat a diet low in  FODMAPs.  Drink enough fluid to keep your urine pale yellow.  Keep a journal of foods that seem to trigger symptoms.  Avoid foods and drinks that: ? Contain added sugar. ? Make your symptoms worse. Dairy products, caffeinated drinks, and carbonated drinks can make symptoms worse for some people. General instructions  Take over-the-counter and prescription medicines and supplements only as told by your health care provider.  Get enough exercise. Do at least 150 minutes of moderate-intensity exercise each week.  Manage your stress. Getting enough sleep and exercise can help you manage stress.  Keep all follow-up visits as told by your health care provider and therapist. This is important. Alcohol Use  Do not drink alcohol if: ? Your health care provider tells you not to drink. ? You are pregnant, may be pregnant, or are planning to become pregnant.  If you drink alcohol, limit how much you have: ? 0-1 drink a day for women. ? 0-2 drinks a day for men.  Be aware of how much alcohol is in your drink. In the [place] one drink equals one typical bottle of beer (12 oz), one-half glass of wine (5 oz), or one shot of hard liquor (1 oz). Contact a health care provider if you have:  Constant pain.  Weight loss.  Difficulty or pain when swallowing.  Diarrhea that gets worse. Get help right away if you have:  Severe abdominal pain.  Fever.  Diarrhea with symptoms of dehydration, such as dizziness or dry mouth.  Bright red blood in your stool.  Stool that is black and tarry.  Abdominal swelling.  Vomiting that does not stop.  Blood in your vomit. Summary  Irritable bowel syndrome (IBS) is not one specific disease. It is a group of symptoms that affects digestion.  Your intestines may become more sensitive and overreact to certain things. This may be especially true when you eat certain foods or when you are under stress.  There is no cure for IBS, but treatment can help  relieve symptoms. This information is not intended to replace advice given to you by your health care provider. Make sure you discuss any questions you have with your health care provider. Document Revised: 01/27/2017 Document Reviewed: 01/27/2017 Elsevier Patient Education  2020 [employer].

## 2019-05-24 ENCOUNTER — Encounter: Payer: Self-pay | Admitting: Nurse Practitioner

## 2019-05-25 DIAGNOSIS — R69 Illness, unspecified: Secondary | ICD-10-CM | POA: Diagnosis not present

## 2019-06-01 DIAGNOSIS — R69 Illness, unspecified: Secondary | ICD-10-CM | POA: Diagnosis not present

## 2019-06-11 ENCOUNTER — Other Ambulatory Visit: Payer: Self-pay | Admitting: Nurse Practitioner

## 2019-06-27 ENCOUNTER — Encounter: Payer: Self-pay | Admitting: Nurse Practitioner

## 2019-07-08 ENCOUNTER — Ambulatory Visit: Payer: Medicare HMO | Admitting: Gastroenterology

## 2019-07-08 ENCOUNTER — Encounter: Payer: Self-pay | Admitting: Gastroenterology

## 2019-07-08 VITALS — BP 150/78 | HR 80 | Ht 60.5 in | Wt 171.4 lb

## 2019-07-08 DIAGNOSIS — R1084 Generalized abdominal pain: Secondary | ICD-10-CM

## 2019-07-08 NOTE — Patient Instructions (Signed)
If you are age 82 or older, your body mass index should be between 23-30. Your Body mass index is 32.92 kg/m. If this is out of the aforementioned range listed, please consider follow up with your Primary Care Provider.  If you are age 20 or younger, your body mass index should be between 19-25. Your Body mass index is 32.92 kg/m. If this is out of the aformentioned range listed, please consider follow up with your Primary Care Provider.   It was a pleasure to see you today!  Dr. Loletha Carrow

## 2019-07-08 NOTE — Progress Notes (Signed)
[facility] Progress Note  Chief Complaint: Chronic abdominal pain  Subjective  History: Veronica Paul was here with her daughter, again sent for reevaluation by primary care for ongoing abdominal pain.  It has been going on since last year, has been migratory and very difficult to characterize.  No cause on CT scan and subsequent upper endoscopy.  It did not seem related to constipation is near as can be determined from asking her and her daughter.  Primary care requested a colonoscopy which was arranged but canceled because the patient was in rapid A. fib in the preop area.  She was admitted for A. fib rate control, and then had a colonoscopy with my partner Dr. Hilarie Fredrickson.  It was unrevealing for cause of symptoms.  According to primary care note on 05/19/2019, history was obtained that the pain subsided after bowel preparation for about 2 days after procedure.  Linzess 72 mcg daily was prescribed for a 1 month trial.    Veronica Paul is again accompanied by her daughter.  As before, she is a limited historian and says her "stomach hurts".  She has generalized abdominal pain that seems primarily centered in the mid to lower abdomen.  There is still no clear or consistent triggers, it seems to be there much of the time.  The pain has not been improved by regimen of Dulcolax and MiraLAX having regular bowel habits.  Stools been loose the last couple of days after a fish dinner that may not agree with her.  She does not have new symptoms such as vomiting, weight loss change in the character of the pain.  ROS: Cardiovascular:  no chest pain Respiratory: no dyspnea  The patient's Past Medical, Family and Social History were reviewed and are on file in the EMR.  Objective:  Med list reviewed  Current Outpatient Medications:  .  apixaban (ELIQUIS) 5 MG TABS tablet, Take 1 tablet (5 mg total) by mouth 2 (two) times daily., Disp: 180 tablet, Rfl: 1 .  Blood Glucose Monitoring Suppl (ONETOUCH VERIO) w/Device  KIT, Check blood sugars daily Dx E11.9, Disp: 1 kit, Rfl: 0 .  clonazePAM (KLONOPIN) 0.5 MG tablet, Take 1 tablet (0.5 mg total) by mouth 2 (two) times daily as needed. (Patient taking differently: Take 0.5 mg by mouth 2 (two) times daily as needed for anxiety. ), Disp: 60 tablet, Rfl: 3 .  diltiazem (CARDIZEM CD) 360 MG 24 hr capsule, Take 1 capsule (360 mg total) by mouth daily., Disp: 90 capsule, Rfl: 3 .  fish oil-omega-3 fatty acids 1000 MG capsule, Take 1 g by mouth daily. , Disp: , Rfl:  .  furosemide (LASIX) 20 MG tablet, TAKE 0.5 TABLETS (10 MG TOTAL) BY MOUTH DAILY AS NEEDED. TAKE 1/2 TABLET AS NEEDED 3 TIMES WEEKLY, Disp: 90 tablet, Rfl: 0 .  glucose blood (ONETOUCH VERIO) test strip, Check blood sugars daily Dx E11.9, Disp: 100 each, Rfl: 3 .  Lancets (ONETOUCH ULTRASOFT) lancets, Check blood sugars daily Dx E11.9, Disp: 100 each, Rfl: 3 .  levothyroxine (SYNTHROID) 75 MCG tablet, Take 1 tablet (75 mcg total) by mouth daily., Disp: 90 tablet, Rfl: 1 .  metFORMIN (GLUCOPHAGE) 500 MG tablet, Take 1 tablet (500 mg total) by mouth daily., Disp: 90 tablet, Rfl: 1 .  metoprolol (TOPROL-XL) 200 MG 24 hr tablet, TAKE 1 AND 1/2 TABLET DAILY (Patient taking differently: Take 300 mg by mouth every evening. ), Disp: 135 tablet, Rfl: 1 .  nitroGLYCERIN (NITROSTAT) 0.4 MG SL tablet,  PLACE 1 TABLET (0.4 MG TOTAL) UNDER THE TONGUE EVERY 5 (FIVE) MINUTES AS NEEDED FOR CHEST PAIN., Disp: 25 tablet, Rfl: 1   Vital signs in last 24 hrs: Vitals:   07/08/19 1522  BP: (!) 150/78  Pulse: 80    Physical Exam  Blunted affect as before.  Gets on exam table slowly but without assistance.  She is not in any physical discomfort.  HEENT: sclera anicteric, oral mucosa moist without lesions  Neck: supple, no thyromegaly, JVD or lymphadenopathy  Cardiac: RRR without murmurs, S1S2 heard, no peripheral edema  Pulm: clear to auscultation bilaterally, normal RR and effort noted  Abdomen: soft, no apparent  tenderness with palpation in the way of wincing or guarding or verbal response.  But if directly asked it is tender she says yes., with active bowel sounds. No guarding or palpable hepatosplenomegaly.  No visible or palpable hernia.  Skin; warm and dry, no jaundice or rash  Labs:   ___________________________________________ Radiologic studies:   ____________________________________________ Other: Colonoscopy report reviewed and discussed with Dr. Hilarie Fredrickson after he performed it.  _____________________________________________ Assessment & Plan  Assessment: Encounter Diagnosis  Name Primary?  . Generalized abdominal pain Yes   As before, I simply cannot characterize this pain any better and thus far has found no visible cause an extensive work-up.  Even treating questionable constipation lately has not led to any change.  Her daughter wondered if this was some "nerve problem".  Initially I thought she meant anxiety, but then seems to me in some neurological condition, and they suggested an acute visit with a neurologist planned.  This does not seem to be an underlying neurological condition.  Also, it is not behaving like focal abdominal wall pain since there there is no increase in the pain with movement or any focal tenderness on exam.   Plan: As near as can be determined, there is no harmful cause for this pain that she now mostly describes as a squeezing discomfort.  Her daughter says that even the pressure of a depends undergarment on her will be enough to cause pain. I do not think any further testing is likely to be revealing, and I am afraid I have no particular therapy to offer at this point.  I have done my best to reassure them of this, and believes she will have to live with this that she can.  I would not advocate the use of antispasmodic medicines due to the increased chance of side effects in this age group. If she develops acute diarrhea such as has occurred in the last few  days, she should definitely stop Dulcolax and MiraLAX for couple of days.  20 minutes were spent on this encounter (including chart review, history/exam, counseling/coordination of care, and documentation)  Nelida Meuse III

## 2019-07-12 NOTE — Progress Notes (Signed)
Cardiology Office Note   Date:  07/13/2019   ID:  Veronica Paul, DOB 17-Nov-1935, MRN [id number]  PCP:  Chevis Pretty, FNP  Cardiologist:   Minus Breeding, MD   Chief Complaint  Patient presents with  . Atrial Fibrillation      History of Present Illness: Veronica Paul is a 84 y.o. female who presents for evaluation abdominal pain.  I have seen her for atrial fib .  She was in the hospital with rapid atrial fib secondary to missing meds prior to a colonoscopy.   She has lots of abdominal pain.  On the outside chance that this was angina I sent her for a perfusion study.  She had no evidence of ischemia.   Since I last saw her her daughters have been working on her to have a high-fiber diet and this may be helping with some of her abdominal complaints.  She is not having any palpitations and does not notice her fibrillation.  She has had no presyncope or syncope.  She denies any chest pressure, neck or arm discomfort.  She is walking to the mailbox more.  She is watering her flowers.  They are trying to make her be more active.  Past Medical History:  Diagnosis Date  . Allergy    "my nose runs alot"  . Anxiety   . Arthritis   . Atrial fibrillation ([place])   . Blood transfusion without reported diagnosis    "when I had a miscarrage in 1957"  . Cataract    bilateral removed  . Cholelithiasis   . Constipation    x ray showed increased stool in colon- uses Miralax daily and Dulcolax twice a week   . Depression   . Diabetes mellitus   . Dyslipidemia   . HTN (hypertension)   . Hx of long-term (current) use of anticoagulants   . Hyperlipidemia   . Hypertension   . Hypothyroidism   . Lung cancer ([place])   . Obesity   . Osteopenia   . Vitamin D deficiency     Past Surgical History:  Procedure Laterality Date  . CATARACT EXTRACTION Bilateral   . COLONOSCOPY    . COLONOSCOPY WITH PROPOFOL N/A 05/06/2019   Procedure: COLONOSCOPY WITH PROPOFOL;  Surgeon: Jerene Bears,  MD;  Location: [facility];  Service: Gastroenterology;  Laterality: N/A;  . LUNG REMOVAL, PARTIAL  1998   Right  . POLYPECTOMY  05/06/2019   Procedure: POLYPECTOMY;  Surgeon: Jerene Bears, MD;  Location: [facility];  Service: Gastroenterology;;  . THYROIDECTOMY    . TUBAL LIGATION    . UPPER GASTROINTESTINAL ENDOSCOPY       Current Outpatient Medications  Medication Sig Dispense Refill  . apixaban (ELIQUIS) 5 MG TABS tablet Take 1 tablet (5 mg total) by mouth 2 (two) times daily. 180 tablet 1  . clonazePAM (KLONOPIN) 0.5 MG tablet Take 1 tablet (0.5 mg total) by mouth 2 (two) times daily as needed. (Patient taking differently: Take 0.5 mg by mouth 2 (two) times daily as needed for anxiety. ) 60 tablet 3  . diltiazem (CARDIZEM CD) 360 MG 24 hr capsule Take 1 capsule (360 mg total) by mouth daily. 90 capsule 3  . fish oil-omega-3 fatty acids 1000 MG capsule Take 1 g by mouth daily.     . furosemide (LASIX) 20 MG tablet TAKE 0.5 TABLETS (10 MG TOTAL) BY MOUTH DAILY AS NEEDED. TAKE 1/2 TABLET AS NEEDED 3 TIMES WEEKLY 90 tablet 0  .  levothyroxine (SYNTHROID) 75 MCG tablet Take 1 tablet (75 mcg total) by mouth daily. 90 tablet 1  . metFORMIN (GLUCOPHAGE) 500 MG tablet Take 1 tablet (500 mg total) by mouth daily. 90 tablet 1  . metoprolol (TOPROL-XL) 200 MG 24 hr tablet TAKE 1 AND 1/2 TABLET DAILY (Patient taking differently: Take 300 mg by mouth every evening. ) 135 tablet 1  . nitroGLYCERIN (NITROSTAT) 0.4 MG SL tablet PLACE 1 TABLET (0.4 MG TOTAL) UNDER THE TONGUE EVERY 5 (FIVE) MINUTES AS NEEDED FOR CHEST PAIN. 25 tablet 1  . Blood Glucose Monitoring Suppl (ONETOUCH VERIO) w/Device KIT Check blood sugars daily Dx E11.9 1 kit 0  . glucose blood (ONETOUCH VERIO) test strip Check blood sugars daily Dx E11.9 100 each 3  . Lancets (ONETOUCH ULTRASOFT) lancets Check blood sugars daily Dx E11.9 100 each 3   No current facility-administered medications for this visit.    Allergies:   Ace  inhibitors, Feldene [piroxicam], Meloxicam, Prevnar [pneumococcal 13-val conj vacc], Statins, Sulfa antibiotics, and Zithromax [azithromycin dihydrate]    ROS:  Please see the history of present illness.   Otherwise, review of systems are positive for abdominal discomfort.   All other systems are reviewed and negative.    PHYSICAL EXAM: VS:  BP 138/80   Pulse 88   Ht '5\' 3"'  (1.6 m)   Wt 170 lb (77.1 kg)   LMP 05/11/1992   BMI 30.11 kg/m  , BMI Body mass index is 30.11 kg/m. GENERAL:  Well appearing NECK:  No jugular venous distention, waveform within normal limits, carotid upstroke brisk and symmetric, no bruits, no thyromegaly LUNGS:  Clear to auscultation bilaterally CHEST:  Unremarkable HEART:  PMI not displaced or sustained,S1 and S2 within normal limits, no S3, no clicks, no rubs, no murmurs, irregular ABD:  Flat, positive bowel sounds normal in frequency in pitch, no bruits, no rebound, no guarding, no midline pulsatile mass, no hepatomegaly, no splenomegaly EXT:  2 plus pulses throughout, mild edema, no cyanosis no clubbing   EKG:  EKG not ordered today.   Recent Labs: 08/17/2018: Magnesium 2.1 05/05/2019: ALT 19; B Natriuretic Peptide 690.7; TSH 0.645 05/06/2019: BUN 6; Creatinine, Ser 0.61; Hemoglobin 14.0; Platelets 228; Sodium 143 05/07/2019: Potassium 3.8    Lipid Panel    Component Value Date/Time   CHOL 234 (H) 04/29/2019 0853   CHOL 100 05/07/2012 0957   TRIG 119 04/29/2019 0853   TRIG 148 06/21/2014 0932   TRIG 150 (H) 05/07/2012 0957   HDL 54 04/29/2019 0853   HDL 56 06/21/2014 0932   HDL 40 05/07/2012 0957   CHOLHDL 4.3 04/29/2019 0853   LDLCALC 159 (H) 04/29/2019 0853   LDLCALC 49 12/02/2013 0923   LDLCALC 30 05/07/2012 0957      Wt Readings from Last 3 Encounters:  07/13/19 170 lb (77.1 kg)  07/08/19 171 lb 6 oz (77.7 kg)  05/19/19 173 lb (78.5 kg)      Other studies Reviewed: Additional studies/ records that were reviewed today include:   Hospital records Review of the above records demonstrates:  Please see elsewhere in the note.     ASSESSMENT AND PLAN:  Atrial fibrillation:    Veronica Paul has a CHA2DS2 - VASc score of 5.  She tolerates anticoagulation.   I think her elevated rate in the hospital was related to missing the medicines for the colonoscopy.  At home its been well controlled by her report.  No change in therapy.   Abdominal  pain:   We had a long discussion about this.  This does not sound like intestinal angina.  Her daughter asked whether it could be Cardizem and there is a less than 2% chance.  Unfortunately I think she would have a hard time trialing.  Without Cardizem as her rate and blood pressures would be too high.  They would prefer to continue the Cardizem and continue with attempts at diet to improve this abdominal discomfort as it seems to be working.   Coronary artery calcium:   She had a negative Lexiscan Myoview.  She will continue with risk reduction.   Hypertension:   The blood pressure is at target.  No change in therapy.   Chronic constipation:   Constipation has improved with a high-fiber diet so previous thoughts of stopping the Cardizem as discussed above and I would not suggest this.  Covid education.   She has been vaccinated.     Current medicines are reviewed at length with the patient today.  The patient does not have concerns regarding medicines.  The following changes have been made:  As above  Labs/ tests ordered today include:   No orders of the defined types were placed in this encounter.    Disposition:   FU with me in six months.     Signed, Minus Breeding, MD  07/13/2019 5:01 PM    [facility]

## 2019-07-13 ENCOUNTER — Other Ambulatory Visit: Payer: Self-pay

## 2019-07-13 ENCOUNTER — Ambulatory Visit: Payer: Medicare HMO | Admitting: Cardiology

## 2019-07-13 ENCOUNTER — Encounter: Payer: Self-pay | Admitting: Cardiology

## 2019-07-13 VITALS — BP 138/80 | HR 88 | Ht 63.0 in | Wt 170.0 lb

## 2019-07-13 DIAGNOSIS — R931 Abnormal findings on diagnostic imaging of heart and coronary circulation: Secondary | ICD-10-CM

## 2019-07-13 DIAGNOSIS — Z7189 Other specified counseling: Secondary | ICD-10-CM | POA: Diagnosis not present

## 2019-07-13 DIAGNOSIS — R1084 Generalized abdominal pain: Secondary | ICD-10-CM

## 2019-07-13 DIAGNOSIS — Z1231 Encounter for screening mammogram for malignant neoplasm of breast: Secondary | ICD-10-CM | POA: Diagnosis not present

## 2019-07-13 DIAGNOSIS — I482 Chronic atrial fibrillation, unspecified: Secondary | ICD-10-CM

## 2019-07-13 DIAGNOSIS — I10 Essential (primary) hypertension: Secondary | ICD-10-CM

## 2019-07-13 NOTE — Patient Instructions (Signed)
Medication Instructions:  The current medical regimen is effective;  continue present plan and medications.  *If you need a refill on your cardiac medications before your next appointment, please call your pharmacy*  Follow-Up: At [facility], you and your health needs are our priority.  As part of our continuing mission to provide you with exceptional heart care, we have created designated Provider Care Teams.  These Care Teams include your primary Cardiologist (physician) and Advanced Practice Providers (APPs -  Physician Assistants and Nurse Practitioners) who all work together to provide you with the care you need, when you need it.  We recommend signing up for the patient portal called "MyChart".  Sign up information is provided on this After Visit Summary.  MyChart is used to connect with patients for Virtual Visits (Telemedicine).  Patients are able to view lab/test results, encounter notes, upcoming appointments, etc.  Non-urgent messages can be sent to your provider as well.   To learn more about what you can do with MyChart, go to [email or web address].    Your next appointment:   12 month(s)  The format for your next appointment:   In Person  Provider:   James Hochrein, MD   Thank you for choosing [facility]!!     

## 2019-07-16 DIAGNOSIS — I110 Hypertensive heart disease with heart failure: Secondary | ICD-10-CM | POA: Diagnosis not present

## 2019-07-16 DIAGNOSIS — E119 Type 2 diabetes mellitus without complications: Secondary | ICD-10-CM | POA: Diagnosis not present

## 2019-07-16 DIAGNOSIS — I4891 Unspecified atrial fibrillation: Secondary | ICD-10-CM | POA: Diagnosis not present

## 2019-07-16 DIAGNOSIS — R69 Illness, unspecified: Secondary | ICD-10-CM | POA: Diagnosis not present

## 2019-07-16 DIAGNOSIS — E669 Obesity, unspecified: Secondary | ICD-10-CM | POA: Diagnosis not present

## 2019-07-16 DIAGNOSIS — E039 Hypothyroidism, unspecified: Secondary | ICD-10-CM | POA: Diagnosis not present

## 2019-07-16 DIAGNOSIS — I25119 Atherosclerotic heart disease of native coronary artery with unspecified angina pectoris: Secondary | ICD-10-CM | POA: Diagnosis not present

## 2019-07-16 DIAGNOSIS — D6869 Other thrombophilia: Secondary | ICD-10-CM | POA: Diagnosis not present

## 2019-07-16 DIAGNOSIS — I509 Heart failure, unspecified: Secondary | ICD-10-CM | POA: Diagnosis not present

## 2019-07-23 ENCOUNTER — Other Ambulatory Visit: Payer: Self-pay | Admitting: Nurse Practitioner

## 2019-07-23 DIAGNOSIS — R69 Illness, unspecified: Secondary | ICD-10-CM | POA: Diagnosis not present

## 2019-08-01 DIAGNOSIS — R69 Illness, unspecified: Secondary | ICD-10-CM | POA: Diagnosis not present

## 2019-08-08 ENCOUNTER — Encounter: Payer: Self-pay | Admitting: Nurse Practitioner

## 2019-08-12 ENCOUNTER — Ambulatory Visit (INDEPENDENT_AMBULATORY_CARE_PROVIDER_SITE_OTHER): Payer: Medicare HMO | Admitting: Nurse Practitioner

## 2019-08-12 ENCOUNTER — Encounter: Payer: Self-pay | Admitting: Nurse Practitioner

## 2019-08-12 ENCOUNTER — Other Ambulatory Visit: Payer: Self-pay

## 2019-08-12 VITALS — BP 158/89 | HR 88 | Temp 97.5°F | Resp 20 | Ht 63.0 in | Wt 168.0 lb

## 2019-08-12 DIAGNOSIS — F411 Generalized anxiety disorder: Secondary | ICD-10-CM

## 2019-08-12 DIAGNOSIS — F3341 Major depressive disorder, recurrent, in partial remission: Secondary | ICD-10-CM

## 2019-08-12 DIAGNOSIS — E876 Hypokalemia: Secondary | ICD-10-CM

## 2019-08-12 DIAGNOSIS — E782 Mixed hyperlipidemia: Secondary | ICD-10-CM | POA: Diagnosis not present

## 2019-08-12 DIAGNOSIS — E039 Hypothyroidism, unspecified: Secondary | ICD-10-CM

## 2019-08-12 DIAGNOSIS — E119 Type 2 diabetes mellitus without complications: Secondary | ICD-10-CM

## 2019-08-12 DIAGNOSIS — I4891 Unspecified atrial fibrillation: Secondary | ICD-10-CM | POA: Diagnosis not present

## 2019-08-12 DIAGNOSIS — I10 Essential (primary) hypertension: Secondary | ICD-10-CM

## 2019-08-12 DIAGNOSIS — R609 Edema, unspecified: Secondary | ICD-10-CM

## 2019-08-12 DIAGNOSIS — Z6834 Body mass index (BMI) 34.0-34.9, adult: Secondary | ICD-10-CM

## 2019-08-12 DIAGNOSIS — R1084 Generalized abdominal pain: Secondary | ICD-10-CM | POA: Diagnosis not present

## 2019-08-12 DIAGNOSIS — E038 Other specified hypothyroidism: Secondary | ICD-10-CM

## 2019-08-12 DIAGNOSIS — L989 Disorder of the skin and subcutaneous tissue, unspecified: Secondary | ICD-10-CM

## 2019-08-12 DIAGNOSIS — R69 Illness, unspecified: Secondary | ICD-10-CM | POA: Diagnosis not present

## 2019-08-12 LAB — BAYER DCA HB A1C WAIVED: HB A1C (BAYER DCA - WAIVED): 5.7 % (ref <7.0)

## 2019-08-12 MED ORDER — APIXABAN 5 MG PO TABS: 5.0000 mg | ORAL_TABLET | Freq: Two times a day (BID) | ORAL | 1 refills | Status: DC

## 2019-08-12 MED ORDER — METOPROLOL SUCCINATE ER 200 MG PO TB24: ORAL_TABLET | ORAL | 1 refills | Status: DC

## 2019-08-12 MED ORDER — LEVOTHYROXINE SODIUM 75 MCG PO TABS: 75.0000 ug | ORAL_TABLET | Freq: Every day | ORAL | 1 refills | Status: DC

## 2019-08-12 MED ORDER — METFORMIN HCL 500 MG PO TABS: 500.0000 mg | ORAL_TABLET | Freq: Every day | ORAL | 1 refills | Status: DC

## 2019-08-12 MED ORDER — DILTIAZEM HCL ER COATED BEADS 360 MG PO CP24: 360.0000 mg | ORAL_CAPSULE | Freq: Every day | ORAL | 3 refills | Status: DC

## 2019-08-12 MED ORDER — FUROSEMIDE 20 MG PO TABS: ORAL_TABLET | ORAL | 0 refills | Status: DC

## 2019-08-12 MED ORDER — DICYCLOMINE HCL 10 MG PO CAPS: 10.0000 mg | ORAL_CAPSULE | Freq: Three times a day (TID) | ORAL | 1 refills | Status: DC

## 2019-08-12 NOTE — Progress Notes (Addendum)
Subjective:    Patient ID: Veronica Paul, female    DOB: 15-Mar-1935, 84 y.o.   MRN: [id number]   Chief Complaint: Medical Management of Chronic Issues    HPI:  1. Essential hypertension No c/o chest pain, sob or headache. Does not check blood pressure at home. BP Readings from Last 3 Encounters:  07/13/19 138/80  07/08/19 (!) 150/78  05/19/19 (!) 152/80     2. Diabetes mellitus without complication (HCC) Fasting blood sugars are running around no higher then 120. She has no readings below 90's. Lab Results  Component Value Date   HGBA1C 6.1 04/29/2019     3. Mixed hyperlipidemia Has a poor appetite due to strange abdominal pain she has. Lab Results  Component Value Date   CHOL 234 (H) 04/29/2019   HDL 54 04/29/2019   LDLCALC 159 (H) 04/29/2019   TRIG 119 04/29/2019   CHOLHDL 4.3 04/29/2019     4. Atrial fibrillation with RVR (HCC) Good rate control on metoprolol. Denies any palpitations. Is on eliquis without any bleeding problems.  5. Acquired hypothyroidism No problems that she is aware of. Lab Results  Component Value Date   TSH 0.645 05/05/2019     6. Hypokalemia Denies any lower ext cramping. Lab Results  Component Value Date   K 3.8 05/07/2019     7. Peripheral edema Has some swelling daily.  8. Recurrent major depressive disorder, in partial remission ([place]) Is on no antidepressants. Says she is doing well. Depression screen [facility] 2/9 08/12/2019 08/12/2019 05/19/2019  Decreased Interest 0 0 0  Down, Depressed, Hopeless 0 0 0  PHQ - 2 Score 0 0 0  Altered sleeping 0 - -  Tired, decreased energy 0 - -  Change in appetite 0 - -  Feeling bad or failure about yourself  0 - -  Trouble concentrating 0 - -  Moving slowly or fidgety/restless 0 - -  Suicidal thoughts 0 - -  PHQ-9 Score 0 - -  Difficult doing work/chores Not difficult at all - -  Some recent data might be hidden     9. GAD (generalized anxiety disorder) She stopped taking her  klonopin and she is doing well. GAD 7 : Generalized Anxiety Score 10/22/2018 11/05/2017 10/02/2016  Nervous, Anxious, on Edge 1 1 0  Control/stop worrying 0 3 0  Worry too much - different things 0 3 0  Trouble relaxing 0 0 0  Restless 0 0 0  Easily annoyed or irritable 0 0 0  Afraid - awful might happen 0 0 0  Total GAD 7 Score 1 7 0  Anxiety Difficulty Not difficult at all Not difficult at all Not difficult at all      10. BMI 34.0-34.9,adult No recent weight changes Wt Readings from Last 3 Encounters:  08/12/19 168 lb (76.2 kg)  07/13/19 170 lb (77.1 kg)  07/08/19 171 lb 6 oz (77.7 kg)   BMI Readings from Last 3 Encounters:  08/12/19 29.76 kg/m  07/13/19 30.11 kg/m  07/08/19 32.92 kg/m       Outpatient Encounter Medications as of 08/12/2019  Medication Sig  . apixaban (ELIQUIS) 5 MG TABS tablet Take 1 tablet (5 mg total) by mouth 2 (two) times daily.  . Blood Glucose Monitoring Suppl (ONETOUCH VERIO) w/Device KIT Check blood sugars daily Dx E11.9  . clonazePAM (KLONOPIN) 0.5 MG tablet Take 1 tablet (0.5 mg total) by mouth 2 (two) times daily as needed. (Patient taking differently: Take 0.5 mg by  mouth 2 (two) times daily as needed for anxiety. )  . diltiazem (CARDIZEM CD) 360 MG 24 hr capsule Take 1 capsule (360 mg total) by mouth daily.  . fish oil-omega-3 fatty acids 1000 MG capsule Take 1 g by mouth daily.   . furosemide (LASIX) 20 MG tablet TAKE 0.5 TABLETS (10 MG TOTAL) BY MOUTH DAILY AS NEEDED. TAKE 1/2 TABLET AS NEEDED 3 TIMES WEEKLY  . Lancets (ONETOUCH ULTRASOFT) lancets Check blood sugars daily Dx E11.9  . levothyroxine (SYNTHROID) 75 MCG tablet Take 1 tablet (75 mcg total) by mouth daily.  . metFORMIN (GLUCOPHAGE) 500 MG tablet Take 1 tablet (500 mg total) by mouth daily.  . metoprolol (TOPROL-XL) 200 MG 24 hr tablet TAKE 1 AND 1/2 TABLET DAILY (Patient taking differently: Take 300 mg by mouth every evening. )  . nitroGLYCERIN (NITROSTAT) 0.4 MG SL tablet PLACE  1 TABLET (0.4 MG TOTAL) UNDER THE TONGUE EVERY 5 (FIVE) MINUTES AS NEEDED FOR CHEST PAIN.  Marland Kitchen ONETOUCH VERIO test strip CHECK BLOOD SUGARS DAILY DX E11.9     Past Surgical History:  Procedure Laterality Date  . CATARACT EXTRACTION Bilateral   . COLONOSCOPY    . COLONOSCOPY WITH PROPOFOL N/A 05/06/2019   Procedure: COLONOSCOPY WITH PROPOFOL;  Surgeon: Jerene Bears, MD;  Location: [facility] END[facility]COPY;  Service: Gastroenterology;  Laterality: N/A;  . LUNG REMOVAL, PARTIAL  1998   Right  . POLYPECTOMY  05/06/2019   Procedure: POLYPECTOMY;  Surgeon: Jerene Bears, MD;  Location: [facility];  Service: Gastroenterology;;  . THYROIDECTOMY    . TUBAL LIGATION    . UPPER GASTROINTESTINAL ENDOSCOPY      Family History  Problem Relation Age of Onset  . Coronary artery disease Brother 54  . Hypertension Brother   . Heart disease Brother   . Hypertension Mother   . Hypertension Father   . Heart disease Father   . Hypertension Sister   . Heart disease Sister   . Cancer Sister   . Arthritis Daughter   . COPD Daughter   . Fibromyalgia Daughter   . Ulcerative colitis Son   . Stroke Maternal Grandmother   . Cancer Brother   . Lung cancer Brother   . Hypertension Sister   . Cancer Sister   . Colon cancer Neg Hx   . Food intolerance Neg Hx   . Esophageal cancer Neg Hx   . Rectal cancer Neg Hx   . Stomach cancer Neg Hx   . Colon polyps Neg Hx     New complaints: Still c/o squeezing sensation around stomach. Pain has not changed. Rates 8/10 and can last all day. Occurs everyday. She has had multiple CT scans, MRI, gastroscopy and colonoscopy with no results.   Social history: Lives by herself- family checks on her daily  Controlled substance contract: n/a     Review of Systems  Constitutional: Negative for diaphoresis.  Eyes: Negative for pain.  Respiratory: Negative for shortness of breath.   Cardiovascular: Negative for chest pain, palpitations and leg swelling.    Gastrointestinal: Positive for abdominal pain. Negative for blood in stool, constipation, diarrhea, nausea and vomiting.  Endocrine: Negative for polydipsia.  Skin: Negative for rash.       Skin lesion right clavicle area  Neurological: Negative for dizziness, weakness and headaches.  Hematological: Does not bruise/bleed easily.  All other systems reviewed and are negative.      Objective:   Physical Exam Vitals and nursing note reviewed.  Constitutional:  General: She is not in acute distress.    Appearance: Normal appearance. She is well-developed.  HENT:     Head: Normocephalic.     Nose: Nose normal.  Eyes:     Pupils: Pupils are equal, round, and reactive to light.  Neck:     Vascular: No carotid bruit or JVD.  Cardiovascular:     Rate and Rhythm: Normal rate. Rhythm irregular.     Heart sounds: Normal heart sounds.  Pulmonary:     Effort: Pulmonary effort is normal. No respiratory distress.     Breath sounds: Normal breath sounds. No wheezing or rales.  Chest:     Chest wall: No tenderness.  Abdominal:     General: Bowel sounds are normal. There is no distension or abdominal bruit.     Palpations: Abdomen is soft. There is no hepatomegaly, splenomegaly, mass or pulsatile mass.     Tenderness: There is abdominal tenderness (diffuse).  Musculoskeletal:        General: Normal range of motion.     Cervical back: Normal range of motion and neck supple.     Right lower leg: Edema (1+) present.     Left lower leg: Edema (1+) present.  Lymphadenopathy:     Cervical: No cervical adenopathy.  Skin:    General: Skin is warm and dry.     Comments: Erythematous maculo papular lesion  With irregular borders on right clavicle area.  Neurological:     Mental Status: She is alert and oriented to person, place, and time.     Deep Tendon Reflexes: Reflexes are normal and symmetric.  Psychiatric:        Behavior: Behavior normal.        Thought Content: Thought content  normal.        Judgment: Judgment normal.    BP (!) 158/89   Pulse 88   Temp (!) 97.5 F (36.4 C) (Temporal)   Resp 20   Ht '5\' 3"'  (1.6 m)   Wt 168 lb (76.2 kg)   LMP 05/11/1992   SpO2 98%   BMI 29.76 kg/m   Hgba1c discussed at appointment 5.7      Assessment & Plan:  TYERRA LORETTO comes in today with chief complaint of Medical Management of Chronic Issues   Diagnosis and orders addressed:  1. Essential hypertension Low sodium diet - CBC with Differential/Platelet - CMP14+EGFR - diltiazem (CARDIZEM CD) 360 MG 24 hr capsule; Take 1 capsule (360 mg total) by mouth daily.  Dispense: 90 capsule; Refill: 3 - metoprolol (TOPROL-XL) 200 MG 24 hr tablet; TAKE 1 AND 1/2 TABLET DAILY  Dispense: 135 tablet; Refill: 1  2. Diabetes mellitus without complication ([place]) Continue to watch carbs in diet - Bayer DCA Hb A1c Waived - metFORMIN (GLUCOPHAGE) 500 MG tablet; Take 1 tablet (500 mg total) by mouth daily.  Dispense: 90 tablet; Refill: 1  3. Mixed hyperlipidemia Low fat diet - Lipid panel  4. Atrial fibrillation with RVR ([place]) Report any heart racing - apixaban (ELIQUIS) 5 MG TABS tablet; Take 1 tablet (5 mg total) by mouth 2 (two) times daily.  Dispense: 180 tablet; Refill: 1  5. Acquired hypothyroidism Labs pending  6. Hypokalemia Labs pending  7. Peripheral edema elevate legs when sitting - furosemide (LASIX) 20 MG tablet; TAKE 0.5 TABLETS (10 MG TOTAL) BY MOUTH DAILY AS NEEDED. TAKE 1/2 TABLET AS NEEDED 3 TIMES WEEKLY  Dispense: 90 tablet; Refill: 0  8. Recurrent major depressive disorder, in partial  remission ([place]) Stress management  9. GAD (generalized anxiety disorder)  10. BMI 34.0-34.9,adult Discussed diet and exercise for person with BMI >25 Will recheck weight in 3-6 months  11. Generalized abdominal pain Will try bentyl and see if will help with pain - H Pylori, IGM, IGG, IGA AB - dicyclomine (BENTYL) 10 MG capsule; Take 1 capsule (10 mg total) by  mouth 4 (four) times daily -  before meals and at bedtime.  Dispense: 30 capsule; Refill: 1  12. Skin lesion of chest wall Do not pick or scratch at area. - Ambulatory referral to Dermatology  13. Other specified hypothyroidism - levothyroxine (SYNTHROID) 75 MCG tablet; Take 1 tablet (75 mcg total) by mouth daily.  Dispense: 90 tablet; Refill: 1   Labs pending Health Maintenance reviewed Diet and exercise encouraged  Follow up plan: 3 months   [name], FNP

## 2019-08-12 NOTE — Patient Instructions (Signed)

## 2019-08-15 LAB — LIPID PANEL
Chol/HDL Ratio: 3.6 ratio (ref 0.0–4.4)
Cholesterol, Total: 184 mg/dL (ref 100–199)
HDL: 51 mg/dL (ref >39)
LDL Chol Calc (NIH): 118 mg/dL — ABNORMAL HIGH (ref 0–99)
Triglycerides: 84 mg/dL (ref 0–149)
VLDL Cholesterol Cal: 15 mg/dL (ref 5–40)

## 2019-08-15 LAB — CBC WITH DIFFERENTIAL/PLATELET
Basophils Absolute: 0.1 10*3/uL (ref 0.0–0.2)
Basos: 1 %
EOS (ABSOLUTE): 0.1 10*3/uL (ref 0.0–0.4)
Eos: 1 %
Hematocrit: 40.4 % (ref 34.0–46.6)
Hemoglobin: 13.6 g/dL (ref 11.1–15.9)
Immature Grans (Abs): 0 10*3/uL (ref 0.0–0.1)
Immature Granulocytes: 0 %
Lymphocytes Absolute: 1.8 10*3/uL (ref 0.7–3.1)
Lymphs: 27 %
MCH: 29.6 pg (ref 26.6–33.0)
MCHC: 33.7 g/dL (ref 31.5–35.7)
MCV: 88 fL (ref 79–97)
Monocytes Absolute: 0.6 10*3/uL (ref 0.1–0.9)
Monocytes: 8 %
Neutrophils Absolute: 4.2 10*3/uL (ref 1.4–7.0)
Neutrophils: 63 %
Platelets: 229 10*3/uL (ref 150–450)
RBC: 4.59 x10E6/uL (ref 3.77–5.28)
RDW: 13.2 % (ref 11.7–15.4)
WBC: 6.7 10*3/uL (ref 3.4–10.8)

## 2019-08-15 LAB — CMP14+EGFR
ALT: 16 IU/L (ref 0–32)
AST: 19 IU/L (ref 0–40)
Albumin/Globulin Ratio: 1.9 (ref 1.2–2.2)
Albumin: 4.3 g/dL (ref 3.6–4.6)
Alkaline Phosphatase: 69 IU/L (ref 48–121)
BUN/Creatinine Ratio: 9 — ABNORMAL LOW (ref 12–28)
BUN: 9 mg/dL (ref 8–27)
Bilirubin Total: 0.5 mg/dL (ref 0.0–1.2)
CO2: 28 mmol/L (ref 20–29)
Calcium: 9 mg/dL (ref 8.7–10.3)
Chloride: 100 mmol/L (ref 96–106)
Creatinine, Ser: 0.95 mg/dL (ref 0.57–1.00)
GFR calc Af Amer: 65 mL/min/{1.73_m2} (ref >59)
GFR calc non Af Amer: 56 mL/min/{1.73_m2} — ABNORMAL LOW (ref >59)
Globulin, Total: 2.3 g/dL (ref 1.5–4.5)
Glucose: 115 mg/dL — ABNORMAL HIGH (ref 65–99)
Potassium: 4.3 mmol/L (ref 3.5–5.2)
Sodium: 141 mmol/L (ref 134–144)
Total Protein: 6.6 g/dL (ref 6.0–8.5)

## 2019-08-15 LAB — H PYLORI, IGM, IGG, IGA AB
H pylori, IgM Abs: 13.4 units — ABNORMAL HIGH (ref 0.0–8.9)
H. pylori, IgA Abs: <9 units (ref 0.0–8.9)
H. pylori, IgG AbS: 0.49 Index Value (ref 0.00–0.79)

## 2019-08-15 MED ORDER — AMOXICILL-CLARITHRO-LANSOPRAZ PO MISC: Freq: Two times a day (BID) | ORAL | 0 refills | Status: DC

## 2019-08-15 NOTE — Addendum Note (Signed)
Addended by: Chevis Pretty on: 08/15/2019 04:16 PM   Modules accepted: Orders

## 2019-08-16 ENCOUNTER — Other Ambulatory Visit: Payer: Self-pay | Admitting: Nurse Practitioner

## 2019-08-16 MED ORDER — AMOXICILLIN 500 MG PO TABS
500.0000 mg | ORAL_TABLET | Freq: Two times a day (BID) | ORAL | 0 refills | Status: DC
Start: 2019-08-16 — End: 2019-09-15

## 2019-08-16 MED ORDER — LANSOPRAZOLE 30 MG PO CPDR
30.0000 mg | DELAYED_RELEASE_CAPSULE | Freq: Two times a day (BID) | ORAL | 0 refills | Status: DC
Start: 2019-08-16 — End: 2019-09-15

## 2019-08-16 MED ORDER — LEVOFLOXACIN 250 MG PO TABS: 250.0000 mg | ORAL_TABLET | Freq: Two times a day (BID) | ORAL | 0 refills | Status: AC

## 2019-08-16 NOTE — Progress Notes (Signed)
Cancelled prevpac due to expense and slight itching with zithromax- called in  The following. Meds ordered this encounter  Medications  . levofloxacin (LEVAQUIN) 250 MG tablet    Sig: Take 1 tablet (250 mg total) by mouth in the morning and at bedtime for 7 days.    Dispense:  28 tablet    Refill:  0    Cancel prevpac    Order Specific Question:   Supervising Provider    Answer:   Caryl Pina A [id number]  . amoxicillin (AMOXIL) 500 MG tablet    Sig: Take 1 tablet (500 mg total) by mouth 2 (two) times daily.    Dispense:  28 tablet    Refill:  0    Cancel prevpac    Order Specific Question:   Supervising Provider    Answer:   Caryl Pina A [id number]  . lansoprazole (PREVACID) 30 MG capsule    Sig: Take 1 capsule (30 mg total) by mouth 2 (two) times daily before a meal.    Dispense:  28 capsule    Refill:  0    Cancel prevpac    Order Specific Question:   Supervising Provider    Answer:   Caryl Pina A [id number]

## 2019-08-23 ENCOUNTER — Telehealth: Payer: Self-pay | Admitting: *Deleted

## 2019-08-23 NOTE — Telephone Encounter (Signed)
On the Levaquin sent in 08/16/19 it says 1 twice a day for 7 days-#[id number]   Is she supposed to take for 14 day?

## 2019-08-23 NOTE — Telephone Encounter (Signed)
Already spoke with family over weekend

## 2019-08-29 ENCOUNTER — Encounter: Payer: Self-pay | Admitting: Nurse Practitioner

## 2019-09-15 ENCOUNTER — Ambulatory Visit (INDEPENDENT_AMBULATORY_CARE_PROVIDER_SITE_OTHER): Payer: Medicare HMO | Admitting: *Deleted

## 2019-09-15 ENCOUNTER — Telehealth: Payer: Self-pay | Admitting: *Deleted

## 2019-09-15 DIAGNOSIS — Z0000 Encounter for general adult medical examination without abnormal findings: Secondary | ICD-10-CM | POA: Diagnosis not present

## 2019-09-15 NOTE — Telephone Encounter (Signed)
Can take it 3x a day as long as it is helping.

## 2019-09-15 NOTE — Telephone Encounter (Signed)
Pt and daughter were questioning the Bentyl order. It says 4 times daily, she is only taking 3 times daily. Please advise

## 2019-09-15 NOTE — Telephone Encounter (Signed)
Pt aware of recommendation

## 2019-09-15 NOTE — Progress Notes (Signed)
MEDICARE ANNUAL WELLNESS VISIT  09/15/2019  Telephone Visit Disclaimer This Medicare AWV was conducted by telephone due to national recommendations for restrictions regarding the COVID-19 Pandemic (e.g. social distancing).  I verified, using two identifiers, that I am speaking with Veronica Paul or their authorized healthcare agent. I discussed the limitations, risks, security, and privacy concerns of performing an evaluation and management service by telephone and the potential availability of an in-person appointment in the future. The patient expressed understanding and agreed to proceed.   Subjective:  Veronica Paul is a 84 y.o. female patient of Veronica Pretty, FNP who had a Medicare Annual Wellness Visit today via telephone. Veronica Paul is Retired and lives alone. she has 4 children. she reports that she is not socially active and does interact with friends/family regularly. she is not physically active and enjoys gardening.  Patient Care Team: Veronica Pretty, FNP as PCP - General (Family Medicine) Minus Breeding, MD as PCP - Cardiology (Cardiology) Minus Breeding, MD as Consulting Physician (Cardiology)  Advanced Directives 09/15/2019 05/05/2019 11/08/2018 08/17/2018 08/17/2018 07/21/2018 07/16/2017  Does Patient Have a Medical Advance Directive? Yes No No No No No No  Type of Advance Directive [place]  Does patient want to make changes to medical advance directive? No - Patient declined - - - - - -  Copy of [place] in Chart? No - copy requested - - - - - -  Would patient like information on creating a medical advance directive? - No - Patient declined - No - Patient declined No - Guardian declined No - Patient declined Yes (MAU/Ambulatory/Procedural Areas - Information given)    Hospital Utilization Over the Past 12 Months: # of hospitalizations or ER visits: 1 # of surgeries: 0  Review of Systems    Patient reports  that her overall health is unchanged compared to last year.  History obtained from chart review and the patient  Patient Reported Readings (BP, Pulse, CBG, Weight, etc) none  Pain Assessment       Current Medications & Allergies (verified) Allergies as of 09/15/2019      Reactions   Ace Inhibitors Cough   Feldene [piroxicam]    REACTION: RASH TO HANDS   Meloxicam Nausea And Vomiting   Prevnar [pneumococcal 13-val Conj Vacc]    Statins Other (See Comments)   myalgia   Sulfa Antibiotics    Rash   Zithromax [azithromycin Dihydrate] Itching      Medication List       Accurate as of September 15, 2019 10:21 AM. If you have any questions, ask your nurse or doctor.        STOP taking these medications   amoxicillin 500 MG tablet Commonly known as: AMOXIL   amoxicillin-clarithromycin-lansoprazole combo pack Commonly known as: Prevpac   fish oil-omega-3 fatty acids 1000 MG capsule   lansoprazole 30 MG capsule Commonly known as: Prevacid     TAKE these medications   apixaban 5 MG Tabs tablet Commonly known as: Eliquis Take 1 tablet (5 mg total) by mouth 2 (two) times daily.   dicyclomine 10 MG capsule Commonly known as: Bentyl Take 1 capsule (10 mg total) by mouth 4 (four) times daily -  before meals and at bedtime.   diltiazem 360 MG 24 hr capsule Commonly known as: CARDIZEM CD Take 1 capsule (360 mg total) by mouth daily.   furosemide 20 MG tablet Commonly known as: LASIX TAKE  0.5 TABLETS (10 MG TOTAL) BY MOUTH DAILY AS NEEDED. TAKE 1/2 TABLET AS NEEDED 3 TIMES WEEKLY   levothyroxine 75 MCG tablet Commonly known as: SYNTHROID Take 1 tablet (75 mcg total) by mouth daily.   metFORMIN 500 MG tablet Commonly known as: GLUCOPHAGE Take 1 tablet (500 mg total) by mouth daily.   metoprolol 200 MG 24 hr tablet Commonly known as: TOPROL-XL TAKE 1 AND 1/2 TABLET DAILY   nitroGLYCERIN 0.4 MG SL tablet Commonly known as: NITROSTAT PLACE 1 TABLET (0.4 MG TOTAL) UNDER  THE TONGUE EVERY 5 (FIVE) MINUTES AS NEEDED FOR CHEST PAIN.   onetouch ultrasoft lancets Check blood sugars daily Dx E11.9   OneTouch Verio test strip Generic drug: glucose blood CHECK BLOOD SUGARS DAILY DX E11.9   OneTouch Verio w/Device Kit Check blood sugars daily Dx E11.9       History (reviewed): Past Medical History:  Diagnosis Date  . Allergy    "my nose runs alot"  . Anxiety   . Arthritis   . Atrial fibrillation ([place])   . Blood transfusion without reported diagnosis    "when I had a miscarrage in 1957"  . Cataract    bilateral removed  . Cholelithiasis   . Constipation    x ray showed increased stool in colon- uses Miralax daily and Dulcolax twice a week   . Depression   . Diabetes mellitus   . Dyslipidemia   . HTN (hypertension)   . Hx of long-term (current) use of anticoagulants   . Hyperlipidemia   . Hypertension   . Hypothyroidism   . Lung cancer ([place])   . Obesity   . Osteopenia   . Vitamin D deficiency    Past Surgical History:  Procedure Laterality Date  . CATARACT EXTRACTION Bilateral   . COLONOSCOPY    . COLONOSCOPY WITH PROPOFOL N/A 05/06/2019   Procedure: COLONOSCOPY WITH PROPOFOL;  Surgeon: Jerene Bears, MD;  Location: [facility];  Service: Gastroenterology;  Laterality: N/A;  . LUNG REMOVAL, PARTIAL  1998   Right  . POLYPECTOMY  05/06/2019   Procedure: POLYPECTOMY;  Surgeon: Jerene Bears, MD;  Location: [facility];  Service: Gastroenterology;;  . THYROIDECTOMY    . TUBAL LIGATION    . UPPER GASTROINTESTINAL ENDOSCOPY     Family History  Problem Relation Age of Onset  . Coronary artery disease Brother 64  . Hypertension Brother   . Heart disease Brother   . Hypertension Mother   . Hypertension Father   . Heart disease Father   . Hypertension Sister   . Heart disease Sister   . Cancer Sister   . Arthritis Daughter   . COPD Daughter   . Fibromyalgia Daughter   . Ulcerative colitis Son   . Stroke Maternal Grandmother   .  Cancer Brother   . Lung cancer Brother   . Hypertension Sister   . Cancer Sister   . Colon cancer Neg Hx   . Food intolerance Neg Hx   . Esophageal cancer Neg Hx   . Rectal cancer Neg Hx   . Stomach cancer Neg Hx   . Colon polyps Neg Hx    Social History   Socioeconomic History  . Marital status: Widowed    Spouse name: Not on file  . Number of children: 4  . Years of education: 46  . Highest education level: 12th grade  Occupational History  . Occupation: Retired    Comment: Conservation officer, historic buildings  Tobacco Use  .  Smoking status: Never Smoker  . Smokeless tobacco: Never Used  Vaping Use  . Vaping Use: Never used  Substance and Sexual Activity  . Alcohol use: No  . Drug use: No  . Sexual activity: Not Currently  Other Topics Concern  . Not on file  Social History Narrative   N/A   Social Determinants of Health   Financial Resource Strain:   . Difficulty of Paying Living Expenses:   Food Insecurity: No Food Insecurity  . Worried About Charity fundraiser in the Last Year: Never true  . Ran Out of Food in the Last Year: Never true  Transportation Needs:   . Lack of Transportation (Medical):   Marland Kitchen Lack of Transportation (Non-Medical):   Physical Activity: Insufficiently Active  . Days of Exercise per Week: 7 days  . Minutes of Exercise per Session: 20 min  Stress: No Stress Concern Present  . Feeling of Stress : Only a little  Social Connections: Moderately Isolated  . Frequency of Communication with Friends and Family: More than three times a week  . Frequency of Social Gatherings with Friends and Family: More than three times a week  . Attends Religious Services: 1 to 4 times per year  . Active Member of Clubs or Organizations: No  . Attends Archivist Meetings: Never  . Marital Status: Widowed    Activities of Daily Living In your present state of health, do you have any difficulty performing the following activities: 09/15/2019 05/05/2019  Hearing? N -    Vision? N -  Difficulty concentrating or making decisions? Y -  Walking or climbing stairs? N -  Dressing or bathing? N -  Doing errands, shopping? Tempie Donning  Preparing Food and eating ? N -  Using the Toilet? N -  In the past six months, have you accidently leaked urine? N -  Do you have problems with loss of bowel control? N -  Managing your Medications? N -  Managing your Finances? N -  Housekeeping or managing your Housekeeping? N -  Some recent data might be hidden    Patient Education/ Literacy    Exercise Current Exercise Habits: Home exercise routine, Type of exercise: walking, Time (Minutes): 20, Frequency (Times/Week): 7, Weekly Exercise (Minutes/Week): 140, Intensity: Mild, Exercise limited by: None identified  Diet Patient reports consuming 3 meals a day and 1 snack(s) a day Patient reports that her primary diet is: Diabetic Patient reports that she does have regular access to food.   Depression Screen PHQ 2/9 Scores 09/15/2019 08/12/2019 08/12/2019 05/19/2019 04/29/2019 01/27/2019 10/22/2018  PHQ - 2 Score 0 0 0 0 0 0 0  PHQ- 9 Score - 0 - - 2 - -  Exception Documentation - - - - - - -     Fall Risk Fall Risk  09/15/2019 08/12/2019 05/19/2019 04/29/2019 01/27/2019  Falls in the past year? 0 0 0 0 0  Number falls in past yr: - - - - -     Objective:  Veronica Paul seemed alert and oriented and she participated appropriately during our telephone visit.  Blood Pressure Weight BMI  BP Readings from Last 3 Encounters:  08/12/19 (!) 158/89  07/13/19 138/80  07/08/19 (!) 150/78   Wt Readings from Last 3 Encounters:  08/12/19 168 lb (76.2 kg)  07/13/19 170 lb (77.1 kg)  07/08/19 171 lb 6 oz (77.7 kg)   BMI Readings from Last 1 Encounters:  08/12/19 29.76 kg/m    *Unable  to obtain current vital signs, weight, and BMI due to telephone visit type  Hearing/Vision  . Hadasa did not seem to have difficulty with hearing/understanding during the telephone conversation . Reports  that she has had a formal eye exam by an eye care professional within the past year . Reports that she has not had a formal hearing evaluation within the past year *Unable to fully assess hearing and vision during telephone visit type  Cognitive Function: 6CIT Screen 09/15/2019 07/21/2018  What Year? 0 points 0 points  What month? 0 points 0 points  What time? 0 points 0 points  Count back from 20 4 points 0 points  Months in reverse 2 points 0 points  Repeat phrase 2 points 2 points  Total Score 8 2   (Normal:0-7, Significant for Dysfunction: >8)  Normal Cognitive Function Screening: Yes   Immunization & Health Maintenance Record Immunization History  Administered Date(s) Administered  . Fluad Quad(high Dose 65+) 12/01/2018  . Influenza Split 01/17/2010  . Influenza Whole 11/07/2011  . Influenza, High Dose Seasonal PF 12/14/2015, 12/01/2016, 11/23/2017  . Influenza,inj,Quad PF,6+ Mos 11/08/2012, 12/02/2013, 12/28/2014  . Moderna SARS-COVID-2 Vaccination 03/12/2019, 04/11/2019  . Pneumococcal Conjugate-13 05/23/2013  . Pneumococcal Polysaccharide-23 12/19/2007  . Td 02/18/2003  . Tdap 11/04/2010  . Zoster 08/13/2011    Health Maintenance  Topic Date Due  . INFLUENZA VACCINE  09/18/2019  . OPHTHALMOLOGY EXAM  10/16/2019  . URINE MICROALBUMIN  01/27/2020  . HEMOGLOBIN A1C  02/11/2020  . FOOT EXAM  08/11/2020  . TETANUS/TDAP  11/03/2020  . MAMMOGRAM  07/12/2021  . DEXA SCAN  Completed  . COVID-19 Vaccine  Completed  . PNA vac Low Risk Adult  Completed       Assessment  This is a routine wellness examination for TOSHI ISHII.  Health Maintenance: Due or Overdue There are no preventive care reminders to display for this patient.  Veronica Paul does not need a referral for Community Assistance: Care Management:   no Social Work:    no Prescription Assistance:  no Nutrition/Diabetes Education:  no   Plan:  Personalized Goals Goals Addressed             This Visit's Progress   . DIET - INCREASE WATER INTAKE      . Exercise 3x per week (30 min per time)   Not on track    Increase exercise to 30 min 3 times weekly Treadmill Seated Strengthening Exercise (Handout)    . Have 3 meals a day   No change    Increase lean protein (Lean beef, Chicken, and Fish) Increase fruits and vegetables with every Meal Increase Water intake       Personalized Health Maintenance & Screening Recommendations  shingles vaccine  Lung Cancer Screening Recommended: no (Low Dose CT Chest recommended if Age 67-80 years, 30 pack-year currently smoking OR have quit w/in past 15 years) Hepatitis C Screening recommended: no HIV Screening recommended: no  Advanced Directives: Written information was not prepared per patient's request.  Referrals & Orders No orders of the defined types were placed in this encounter.   Follow-up Plan . Follow-up with Veronica Pretty, FNP as planned on 11/23/19. Marland Kitchen Pt is up to date on all health maintenance except Shingles vaccine. She will think about the vaccine. We will offer at next appt. . Pt is still independent with ADL's  . Pt has a living will and and a power of attorney. Copies requested for our records. Marland Kitchen  Pt voices no healthcare concerns. . AVS printed and mailed to patient.    I have personally reviewed and noted the following in the patient's chart:   . Medical and social history . Use of alcohol, tobacco or illicit drugs  . Current medications and supplements . Functional ability and status . Nutritional status . Physical activity . Advanced directives . List of other physicians . Hospitalizations, surgeries, and ER visits in previous 12 months . Vitals . Screenings to include cognitive, depression, and falls . Referrals and appointments  In addition, I have reviewed and discussed with Veronica Paul certain preventive protocols, quality metrics, and best practice recommendations. A written  personalized care plan for preventive services as well as general preventive health recommendations is available and can be mailed to the patient at her request.      Rana Snare, LPN  [id number]

## 2019-10-11 ENCOUNTER — Encounter: Payer: Self-pay | Admitting: Nurse Practitioner

## 2019-10-12 DIAGNOSIS — C44519 Basal cell carcinoma of skin of other part of trunk: Secondary | ICD-10-CM | POA: Diagnosis not present

## 2019-10-12 DIAGNOSIS — C44319 Basal cell carcinoma of skin of other parts of face: Secondary | ICD-10-CM | POA: Diagnosis not present

## 2019-10-12 DIAGNOSIS — D2239 Melanocytic nevi of other parts of face: Secondary | ICD-10-CM | POA: Diagnosis not present

## 2019-10-12 DIAGNOSIS — C44329 Squamous cell carcinoma of skin of other parts of face: Secondary | ICD-10-CM | POA: Diagnosis not present

## 2019-10-12 DIAGNOSIS — L570 Actinic keratosis: Secondary | ICD-10-CM | POA: Diagnosis not present

## 2019-10-12 DIAGNOSIS — D0439 Carcinoma in situ of skin of other parts of face: Secondary | ICD-10-CM | POA: Diagnosis not present

## 2019-10-20 DIAGNOSIS — C44329 Squamous cell carcinoma of skin of other parts of face: Secondary | ICD-10-CM | POA: Diagnosis not present

## 2019-10-20 DIAGNOSIS — C44519 Basal cell carcinoma of skin of other part of trunk: Secondary | ICD-10-CM | POA: Diagnosis not present

## 2019-10-27 DIAGNOSIS — R69 Illness, unspecified: Secondary | ICD-10-CM | POA: Diagnosis not present

## 2019-11-09 NOTE — Progress Notes (Signed)
Patient: Home Provider or nurse: in [place]

## 2019-11-10 NOTE — Progress Notes (Signed)
Patient: Home Provider: Office

## 2019-11-23 ENCOUNTER — Other Ambulatory Visit: Payer: Self-pay

## 2019-11-23 ENCOUNTER — Ambulatory Visit (INDEPENDENT_AMBULATORY_CARE_PROVIDER_SITE_OTHER): Payer: Medicare HMO | Admitting: Nurse Practitioner

## 2019-11-23 ENCOUNTER — Encounter: Payer: Self-pay | Admitting: Nurse Practitioner

## 2019-11-23 VITALS — BP 142/74 | HR 95 | Temp 97.3°F | Ht 63.0 in | Wt 176.0 lb

## 2019-11-23 DIAGNOSIS — R609 Edema, unspecified: Secondary | ICD-10-CM | POA: Diagnosis not present

## 2019-11-23 DIAGNOSIS — I4891 Unspecified atrial fibrillation: Secondary | ICD-10-CM

## 2019-11-23 DIAGNOSIS — Z23 Encounter for immunization: Secondary | ICD-10-CM

## 2019-11-23 DIAGNOSIS — Z6834 Body mass index (BMI) 34.0-34.9, adult: Secondary | ICD-10-CM | POA: Diagnosis not present

## 2019-11-23 DIAGNOSIS — E119 Type 2 diabetes mellitus without complications: Secondary | ICD-10-CM | POA: Diagnosis not present

## 2019-11-23 DIAGNOSIS — I10 Essential (primary) hypertension: Secondary | ICD-10-CM | POA: Diagnosis not present

## 2019-11-23 DIAGNOSIS — R69 Illness, unspecified: Secondary | ICD-10-CM | POA: Diagnosis not present

## 2019-11-23 DIAGNOSIS — R768 Other specified abnormal immunological findings in serum: Secondary | ICD-10-CM | POA: Diagnosis not present

## 2019-11-23 DIAGNOSIS — E782 Mixed hyperlipidemia: Secondary | ICD-10-CM

## 2019-11-23 DIAGNOSIS — E039 Hypothyroidism, unspecified: Secondary | ICD-10-CM | POA: Diagnosis not present

## 2019-11-23 LAB — CBC WITH DIFFERENTIAL/PLATELET
Basophils Absolute: 0.1 10*3/uL (ref 0.0–0.2)
Basos: 1 %
EOS (ABSOLUTE): 0.2 10*3/uL (ref 0.0–0.4)
Eos: 2 %
Hematocrit: 41.5 % (ref 34.0–46.6)
Hemoglobin: 13.9 g/dL (ref 11.1–15.9)
Immature Grans (Abs): 0 10*3/uL (ref 0.0–0.1)
Immature Granulocytes: 0 %
Lymphocytes Absolute: 1.8 10*3/uL (ref 0.7–3.1)
Lymphs: 26 %
MCH: 29.7 pg (ref 26.6–33.0)
MCHC: 33.5 g/dL (ref 31.5–35.7)
MCV: 89 fL (ref 79–97)
Monocytes Absolute: 0.7 10*3/uL (ref 0.1–0.9)
Monocytes: 9 %
Neutrophils Absolute: 4.3 10*3/uL (ref 1.4–7.0)
Neutrophils: 62 %
Platelets: 240 10*3/uL (ref 150–450)
RBC: 4.68 x10E6/uL (ref 3.77–5.28)
RDW: 13.1 % (ref 11.7–15.4)
WBC: 7 10*3/uL (ref 3.4–10.8)

## 2019-11-23 LAB — CMP14+EGFR
ALT: 15 IU/L (ref 0–32)
AST: 16 IU/L (ref 0–40)
Albumin/Globulin Ratio: 2 (ref 1.2–2.2)
Albumin: 4.3 g/dL (ref 3.6–4.6)
Alkaline Phosphatase: 68 IU/L (ref 44–121)
BUN/Creatinine Ratio: 10 — ABNORMAL LOW (ref 12–28)
BUN: 9 mg/dL (ref 8–27)
Bilirubin Total: 0.6 mg/dL (ref 0.0–1.2)
CO2: 27 mmol/L (ref 20–29)
Calcium: 9.1 mg/dL (ref 8.7–10.3)
Chloride: 101 mmol/L (ref 96–106)
Creatinine, Ser: 0.93 mg/dL (ref 0.57–1.00)
GFR calc Af Amer: 66 mL/min/{1.73_m2} (ref >59)
GFR calc non Af Amer: 57 mL/min/{1.73_m2} — ABNORMAL LOW (ref >59)
Globulin, Total: 2.2 g/dL (ref 1.5–4.5)
Glucose: 105 mg/dL — ABNORMAL HIGH (ref 65–99)
Potassium: 4 mmol/L (ref 3.5–5.2)
Sodium: 142 mmol/L (ref 134–144)
Total Protein: 6.5 g/dL (ref 6.0–8.5)

## 2019-11-23 LAB — LIPID PANEL
Chol/HDL Ratio: 3.6 ratio (ref 0.0–4.4)
Cholesterol, Total: 198 mg/dL (ref 100–199)
HDL: 55 mg/dL (ref >39)
LDL Chol Calc (NIH): 128 mg/dL — ABNORMAL HIGH (ref 0–99)
Triglycerides: 85 mg/dL (ref 0–149)
VLDL Cholesterol Cal: 15 mg/dL (ref 5–40)

## 2019-11-23 LAB — BAYER DCA HB A1C WAIVED: HB A1C (BAYER DCA - WAIVED): 5.9 % (ref <7.0)

## 2019-11-23 MED ORDER — APIXABAN 5 MG PO TABS: 5.0000 mg | ORAL_TABLET | Freq: Two times a day (BID) | ORAL | 1 refills | Status: DC

## 2019-11-23 MED ORDER — METOPROLOL SUCCINATE ER 200 MG PO TB24: ORAL_TABLET | ORAL | 1 refills | Status: DC

## 2019-11-23 MED ORDER — DILTIAZEM HCL ER COATED BEADS 360 MG PO CP24: 360.0000 mg | ORAL_CAPSULE | Freq: Every day | ORAL | 3 refills | Status: DC

## 2019-11-23 MED ORDER — LEVOTHYROXINE SODIUM 75 MCG PO TABS: 75.0000 ug | ORAL_TABLET | Freq: Every day | ORAL | 1 refills | Status: DC

## 2019-11-23 MED ORDER — METFORMIN HCL 500 MG PO TABS: 500.0000 mg | ORAL_TABLET | Freq: Every day | ORAL | 1 refills | Status: DC

## 2019-11-23 MED ORDER — ONETOUCH ULTRASOFT LANCETS MISC: 3 refills | Status: DC

## 2019-11-23 MED ORDER — FUROSEMIDE 20 MG PO TABS: ORAL_TABLET | ORAL | 0 refills | Status: DC

## 2019-11-23 NOTE — Progress Notes (Signed)
Subjective:    Patient ID: Veronica Paul, female    DOB: November 08, 1935, 84 y.o.   MRN: [id number]   Chief Complaint: Medical Management of Chronic Issues    HPI:  1. Essential hypertension Checks BP at home and usually better at home. Denies chest pain, headaches. Does admit to SOB-reports it hurts for her to take a deep breath because of her stomach pain but this is an ongoing problem. Reports she has had 3 nosebleeds over the past 2 weeks but denies difficulty in stopping the bleeding. BP Readings from Last 3 Encounters:  11/23/19 (!) 158/86  08/12/19 (!) 158/89  07/13/19 138/80     2. Diabetes mellitus without complication (HCC) Is watching carbohydrate intake in diet. Checks BS at home and ranges from 103-118. Denies peripheral neuropathy, blurry vision. Lab Results  Component Value Date   HGBA1C 5.7 08/12/2019    3. Mixed hyperlipidemia Watches fat and cholesterol intake and limits fried foods. Lab Results  Component Value Date   CHOL 184 08/12/2019   HDL 51 08/12/2019   LDLCALC 118 (H) 08/12/2019   TRIG 84 08/12/2019   CHOLHDL 3.6 08/12/2019     4. Atrial fibrillation with RVR ([place]) Has had 2-3 episodes of heart racing since last visit. States it only happens if she forgets to take her metoprolol or if she is late taking it.  5. Acquired hypothyroidism Takes synthroid daily. No problems that she is aware of. Lab Results  Component Value Date   TSH 0.645 05/05/2019    6. BMI 34.0-34.9,adult Has gained 8 pounds since last visit. Wt Readings from Last 3 Encounters:  11/23/19 176 lb (79.8 kg)  08/12/19 168 lb (76.2 kg)  07/13/19 170 lb (77.1 kg)   BMI Readings from Last 3 Encounters:  11/23/19 31.18 kg/m  08/12/19 29.76 kg/m  07/13/19 30.11 kg/m     7. Peripheral edema Takes lasix Monday, Wednesday, Friday and states this helps her peripheral edema. Elevates legs when sitting.   Outpatient Encounter Medications as of 11/23/2019  Medication Sig  .  apixaban (ELIQUIS) 5 MG TABS tablet Take 1 tablet (5 mg total) by mouth 2 (two) times daily.  . Blood Glucose Monitoring Suppl (ONETOUCH VERIO) w/Device KIT Check blood sugars daily Dx E11.9  . diltiazem (CARDIZEM CD) 360 MG 24 hr capsule Take 1 capsule (360 mg total) by mouth daily.  . furosemide (LASIX) 20 MG tablet TAKE 0.5 TABLETS (10 MG TOTAL) BY MOUTH DAILY AS NEEDED. TAKE 1/2 TABLET AS NEEDED 3 TIMES WEEKLY  . Lancets (ONETOUCH ULTRASOFT) lancets Check blood sugars daily Dx E11.9  . levothyroxine (SYNTHROID) 75 MCG tablet Take 1 tablet (75 mcg total) by mouth daily.  . metFORMIN (GLUCOPHAGE) 500 MG tablet Take 1 tablet (500 mg total) by mouth daily.  . metoprolol (TOPROL-XL) 200 MG 24 hr tablet TAKE 1 AND 1/2 TABLET DAILY  . nitroGLYCERIN (NITROSTAT) 0.4 MG SL tablet PLACE 1 TABLET (0.4 MG TOTAL) UNDER THE TONGUE EVERY 5 (FIVE) MINUTES AS NEEDED FOR CHEST PAIN.  Marland Kitchen ONETOUCH VERIO test strip CHECK BLOOD SUGARS DAILY DX E11.9  . dicyclomine (BENTYL) 10 MG capsule Take 1 capsule (10 mg total) by mouth 4 (four) times daily -  before meals and at bedtime. (Patient not taking: Reported on 11/23/2019)   No facility-administered encounter medications on file as of 11/23/2019.    Past Surgical History:  Procedure Laterality Date  . CATARACT EXTRACTION Bilateral   . COLONOSCOPY    . COLONOSCOPY WITH PROPOFOL  N/A 05/06/2019   Procedure: COLONOSCOPY WITH PROPOFOL;  Surgeon: Jerene Bears, MD;  Location: [facility];  Service: Gastroenterology;  Laterality: N/A;  . LUNG REMOVAL, PARTIAL  1998   Right  . POLYPECTOMY  05/06/2019   Procedure: POLYPECTOMY;  Surgeon: Jerene Bears, MD;  Location: [facility];  Service: Gastroenterology;;  . THYROIDECTOMY    . TUBAL LIGATION    . UPPER GASTROINTESTINAL ENDOSCOPY      Family History  Problem Relation Age of Onset  . Coronary artery disease Brother 44  . Hypertension Brother   . Heart disease Brother   . Hypertension Mother   . Hypertension Father    . Heart disease Father   . Hypertension Sister   . Heart disease Sister   . Cancer Sister   . Arthritis Daughter   . COPD Daughter   . Fibromyalgia Daughter   . Ulcerative colitis Son   . Stroke Maternal Grandmother   . Cancer Brother   . Lung cancer Brother   . Hypertension Sister   . Cancer Sister   . Colon cancer Neg Hx   . Food intolerance Neg Hx   . Esophageal cancer Neg Hx   . Rectal cancer Neg Hx   . Stomach cancer Neg Hx   . Colon polyps Neg Hx     New complaints: Still having abdominal pain. Rates pain 2-3/10. Describes as squeezing pain that is present throughout whole abdomen. Was taking bentyl and antibiotics previously but the bentyl was not helping.  Social history: Lives alone. Has family that lives nearby.  Controlled substance contract: N/A    Review of Systems  Constitutional: Negative.   HENT: Positive for nosebleeds.   Eyes: Negative.   Respiratory: Positive for shortness of breath (hard to take deep breath b/c of abd pain).   Cardiovascular: Positive for leg swelling.  Gastrointestinal: Positive for abdominal pain.  Endocrine: Negative.   Genitourinary: Negative.   Musculoskeletal: Negative.   Skin:       Scar on right cheek and shoulder from skin cancer removal.  Neurological: Negative.   Hematological: Bruises/bleeds easily.  Psychiatric/Behavioral: Negative.        Objective:   Physical Exam Vitals and nursing note reviewed.  HENT:     Head: Normocephalic.     Right Ear: Tympanic membrane normal.     Left Ear: Tympanic membrane normal.     Nose: Nose normal.     Mouth/Throat:     Mouth: Mucous membranes are moist.     Pharynx: Oropharynx is clear.  Cardiovascular:     Rate and Rhythm: Normal rate. Rhythm irregular.     Pulses: Normal pulses.  Pulmonary:     Effort: Pulmonary effort is normal.     Breath sounds: Normal breath sounds.  Abdominal:     General: Bowel sounds are normal.     Palpations: Abdomen is soft.    Musculoskeletal:        General: Normal range of motion.     Cervical back: Normal range of motion.     Right lower leg: Edema (trace, nonpitting) present.     Left lower leg: Left lower leg edema: trace, nonpitting.  Skin:    General: Skin is warm and dry.     Capillary Refill: Capillary refill takes 2 to 3 seconds.     Comments: Scar right cheek and right shoulder from recent skin cancer removal  Neurological:     Mental Status: She is alert and oriented to  person, place, and time. Mental status is at baseline.     BP (!) 142/74   Pulse 95   Temp (!) 97.3 F (36.3 C) (Temporal)   Ht '5\' 3"'  (1.6 m)   Wt 176 lb (79.8 kg)   LMP 05/11/1992   BMI 31.18 kg/m       Assessment & Plan:  SANIAH SCHROETER comes in today with chief complaint of Medical Management of Chronic Issues   Diagnosis and orders addressed:  1. Essential hypertension Low sodium diet Check BP at home - CBC with Differential/Platelet - CMP14+EGFR - diltiazem (CARDIZEM CD) 360 MG 24 hr capsule; Take 1 capsule (360 mg total) by mouth daily.  Dispense: 90 capsule; Refill: 3 - metoprolol (TOPROL-XL) 200 MG 24 hr tablet; TAKE 1 AND 1/2 TABLET DAILY  Dispense: 135 tablet; Refill: 1  2. Diabetes mellitus without complication (HCC) Check BS daily Watch carbs in diet - Bayer DCA Hb A1c Waived - metFORMIN (GLUCOPHAGE) 500 MG tablet; Take 1 tablet (500 mg total) by mouth daily.  Dispense: 90 tablet; Refill: 1 - Lancets (ONETOUCH ULTRASOFT) lancets; Check blood sugars daily Dx E11.9  Dispense: 100 each; Refill: 3  3. Mixed hyperlipidemia Watch fat/cholesterol intake - Lipid panel  4. Atrial fibrillation with RVR (HCC) Report any episodes of racing heartbeat Take metoprolol bid as prescribed - apixaban (ELIQUIS) 5 MG TABS tablet; Take 1 tablet (5 mg total) by mouth 2 (two) times daily.  Dispense: 180 tablet; Refill: 1  5. BMI 34.0-34.9,adult Discussed diet and exercise for person with BMI >25 Will recheck  weight in 3-6 months  7. Peripheral edema Elevate legs when sitting - furosemide (LASIX) 20 MG tablet; TAKE 0.5 TABLETS (10 MG TOTAL) BY MOUTH DAILY AS NEEDED. TAKE 1/2 TABLET AS NEEDED 3 TIMES WEEKLY  Dispense: 90 tablet; Refill: 0  8. acquired hypothyroidism - levothyroxine (SYNTHROID) 75 MCG tablet; Take 1 tablet (75 mcg total) by mouth daily.  Dispense: 90 tablet; Refill: 1  9 hpylori history Going to go back on prevacid because that seemed to help her in the past  Labs pending Health Maintenance reviewed Diet and exercise encouraged  Follow up plan: 3 months   [place], FNP

## 2019-11-23 NOTE — Patient Instructions (Signed)
Gastritis, Adult Gastritis is inflammation of the stomach. There are two kinds of gastritis:  Acute gastritis. This kind develops suddenly.  Chronic gastritis. This kind is much more common and lasts for a long time. Gastritis happens when the lining of the stomach becomes weak or gets damaged. Without treatment, gastritis can lead to stomach bleeding and ulcers. What are the causes? This condition may be caused by:  An infection.  Drinking too much alcohol.  Certain medicines. These include steroids, antibiotics, and some over-the-counter medicines, such as aspirin or ibuprofen.  Having too much acid in the stomach.  A disease of the intestines or stomach.  Stress.  An allergic reaction.  Crohn's disease.  Some cancer treatments (radiation). Sometimes the cause of this condition is not known. What are the signs or symptoms? Symptoms of this condition include:  Pain or a burning sensation in the upper abdomen.  Nausea.  Vomiting.  An uncomfortable feeling of fullness after eating.  Weight loss.  Bad breath.  Blood in your vomit or stools. In some cases, there are no symptoms. How is this diagnosed? This condition may be diagnosed with:  Your medical history and a description of your symptoms.  A physical exam.  Tests. These can include: ? Blood tests. ? Stool tests. ? A test in which a thin, flexible instrument with a light and a camera is passed down the esophagus and into the stomach (upper endoscopy). ? A test in which a sample of tissue is taken for testing (biopsy). How is this treated? This condition may be treated with medicines. The medicines that are used vary depending on the cause of the gastritis:  If the condition is caused by a bacterial infection, you may be given antibiotic medicines.  If the condition is caused by too much acid in the stomach, you may be given medicines called H2 blockers, proton pump inhibitors, or antacids. Treatment  may also involve stopping the use of certain medicines, such as aspirin, ibuprofen, or other NSAIDs. Follow these instructions at home: Medicines  Take over-the-counter and prescription medicines only as told by your health care provider.  If you were prescribed an antibiotic medicine, take it as told by your health care provider. Do not stop taking the antibiotic even if you start to feel better. Eating and drinking   Eat small, frequent meals instead of large meals.  Avoid foods and drinks that make your symptoms worse.  Drink enough fluid to keep your urine pale yellow. Alcohol use  Do not drink alcohol if: ? Your health care provider tells you not to drink. ? You are pregnant, may be pregnant, or are planning to become pregnant.  If you drink alcohol: ? Limit your use to:  0-1 drink a day for women.  0-2 drinks a day for men. ? Be aware of how much alcohol is in your drink. In the [place] one drink equals one 12 oz bottle of beer (355 mL), one 5 oz glass of wine (148 mL), or one 1 oz glass of hard liquor (44 mL). General instructions  Talk with your health care provider about ways to manage stress, such as getting regular exercise or practicing deep breathing, meditation, or yoga.  Do not use any products that contain nicotine or tobacco, such as cigarettes and e-cigarettes. If you need help quitting, ask your health care provider.  Keep all follow-up visits as told by your health care provider. This is important. Contact a health care provider if:  Your  symptoms get worse.  Your symptoms return after treatment. Get help right away if:  You vomit blood or material that looks like coffee grounds.  You have black or dark red stools.  You are unable to keep fluids down.  Your abdominal pain gets worse.  You have a fever.  You do not feel better after one week. Summary  Gastritis is inflammation of the lining of the stomach that can occur suddenly (acute) or  develop slowly over time (chronic).  This condition is diagnosed with a medical history, a physical exam, or tests.  This condition may be treated with medicines to treat infection or medicines to reduce the amount of acid in your stomach.  Follow your health care provider's instructions about taking medicines, making changes to your diet, and knowing when to call for help. This information is not intended to replace advice given to you by your health care provider. Make sure you discuss any questions you have with your health care provider. Document Revised: 06/23/2017 Document Reviewed: 06/23/2017 Elsevier Patient Education  [place].

## 2019-12-06 ENCOUNTER — Other Ambulatory Visit: Payer: Self-pay

## 2019-12-06 DIAGNOSIS — E119 Type 2 diabetes mellitus without complications: Secondary | ICD-10-CM

## 2019-12-06 DIAGNOSIS — R69 Illness, unspecified: Secondary | ICD-10-CM | POA: Diagnosis not present

## 2019-12-06 MED ORDER — ONETOUCH DELICA LANCETS 33G MISC: 2 refills | Status: DC

## 2019-12-12 DIAGNOSIS — R69 Illness, unspecified: Secondary | ICD-10-CM | POA: Diagnosis not present

## 2020-02-02 DIAGNOSIS — R69 Illness, unspecified: Secondary | ICD-10-CM | POA: Diagnosis not present

## 2020-02-27 ENCOUNTER — Ambulatory Visit: Payer: Self-pay | Admitting: Nurse Practitioner

## 2020-03-01 ENCOUNTER — Other Ambulatory Visit: Payer: Self-pay

## 2020-03-01 ENCOUNTER — Ambulatory Visit (INDEPENDENT_AMBULATORY_CARE_PROVIDER_SITE_OTHER): Payer: Medicare HMO | Admitting: Nurse Practitioner

## 2020-03-01 ENCOUNTER — Encounter: Payer: Self-pay | Admitting: Nurse Practitioner

## 2020-03-01 ENCOUNTER — Other Ambulatory Visit: Payer: Self-pay | Admitting: Nurse Practitioner

## 2020-03-01 VITALS — BP 144/83 | HR 105 | Temp 97.3°F | Ht 63.0 in | Wt 181.6 lb

## 2020-03-01 DIAGNOSIS — I10 Essential (primary) hypertension: Secondary | ICD-10-CM | POA: Diagnosis not present

## 2020-03-01 DIAGNOSIS — I4891 Unspecified atrial fibrillation: Secondary | ICD-10-CM | POA: Diagnosis not present

## 2020-03-01 DIAGNOSIS — E119 Type 2 diabetes mellitus without complications: Secondary | ICD-10-CM | POA: Diagnosis not present

## 2020-03-01 DIAGNOSIS — R609 Edema, unspecified: Secondary | ICD-10-CM | POA: Diagnosis not present

## 2020-03-01 DIAGNOSIS — R1084 Generalized abdominal pain: Secondary | ICD-10-CM

## 2020-03-01 DIAGNOSIS — Z6834 Body mass index (BMI) 34.0-34.9, adult: Secondary | ICD-10-CM | POA: Diagnosis not present

## 2020-03-01 DIAGNOSIS — E782 Mixed hyperlipidemia: Secondary | ICD-10-CM | POA: Diagnosis not present

## 2020-03-01 DIAGNOSIS — E876 Hypokalemia: Secondary | ICD-10-CM

## 2020-03-01 DIAGNOSIS — R69 Illness, unspecified: Secondary | ICD-10-CM | POA: Diagnosis not present

## 2020-03-01 DIAGNOSIS — M8588 Other specified disorders of bone density and structure, other site: Secondary | ICD-10-CM | POA: Diagnosis not present

## 2020-03-01 DIAGNOSIS — F411 Generalized anxiety disorder: Secondary | ICD-10-CM

## 2020-03-01 DIAGNOSIS — F3341 Major depressive disorder, recurrent, in partial remission: Secondary | ICD-10-CM

## 2020-03-01 LAB — CBC WITH DIFFERENTIAL/PLATELET
Basophils Absolute: 0.1 10*3/uL (ref 0.0–0.2)
Basos: 2 %
EOS (ABSOLUTE): 0.3 10*3/uL (ref 0.0–0.4)
Eos: 4 %
Hematocrit: 40.1 % (ref 34.0–46.6)
Hemoglobin: 13.4 g/dL (ref 11.1–15.9)
Immature Grans (Abs): 0 10*3/uL (ref 0.0–0.1)
Immature Granulocytes: 0 %
Lymphocytes Absolute: 1.7 10*3/uL (ref 0.7–3.1)
Lymphs: 25 %
MCH: 29.3 pg (ref 26.6–33.0)
MCHC: 33.4 g/dL (ref 31.5–35.7)
MCV: 88 fL (ref 79–97)
Monocytes Absolute: 0.7 10*3/uL (ref 0.1–0.9)
Monocytes: 10 %
Neutrophils Absolute: 4.1 10*3/uL (ref 1.4–7.0)
Neutrophils: 59 %
Platelets: 266 10*3/uL (ref 150–450)
RBC: 4.57 x10E6/uL (ref 3.77–5.28)
RDW: 12.2 % (ref 11.7–15.4)
WBC: 6.8 10*3/uL (ref 3.4–10.8)

## 2020-03-01 LAB — LIPID PANEL
Chol/HDL Ratio: 3.4 ratio (ref 0.0–4.4)
Cholesterol, Total: 192 mg/dL (ref 100–199)
HDL: 57 mg/dL (ref >39)
LDL Chol Calc (NIH): 120 mg/dL — ABNORMAL HIGH (ref 0–99)
Triglycerides: 81 mg/dL (ref 0–149)
VLDL Cholesterol Cal: 15 mg/dL (ref 5–40)

## 2020-03-01 LAB — CMP14+EGFR
ALT: 11 IU/L (ref 0–32)
AST: 15 IU/L (ref 0–40)
Albumin/Globulin Ratio: 1.8 (ref 1.2–2.2)
Albumin: 4.3 g/dL (ref 3.6–4.6)
Alkaline Phosphatase: 73 IU/L (ref 44–121)
BUN/Creatinine Ratio: 13 (ref 12–28)
BUN: 13 mg/dL (ref 8–27)
Bilirubin Total: 0.6 mg/dL (ref 0.0–1.2)
CO2: 28 mmol/L (ref 20–29)
Calcium: 9.5 mg/dL (ref 8.7–10.3)
Chloride: 102 mmol/L (ref 96–106)
Creatinine, Ser: 0.99 mg/dL (ref 0.57–1.00)
GFR calc Af Amer: 61 mL/min/{1.73_m2} (ref >59)
GFR calc non Af Amer: 53 mL/min/{1.73_m2} — ABNORMAL LOW (ref >59)
Globulin, Total: 2.4 g/dL (ref 1.5–4.5)
Glucose: 111 mg/dL — ABNORMAL HIGH (ref 65–99)
Potassium: 4 mmol/L (ref 3.5–5.2)
Sodium: 143 mmol/L (ref 134–144)
Total Protein: 6.7 g/dL (ref 6.0–8.5)

## 2020-03-01 LAB — BAYER DCA HB A1C WAIVED: HB A1C (BAYER DCA - WAIVED): 5.7 % (ref <7.0)

## 2020-03-01 MED ORDER — ONETOUCH DELICA PLUS LANCING MISC: 1.0000 | Freq: Every day | 0 refills | Status: AC

## 2020-03-01 MED ORDER — CLONAZEPAM 0.5 MG PO TABS: 0.5000 mg | ORAL_TABLET | Freq: Two times a day (BID) | ORAL | 2 refills | Status: DC | PRN

## 2020-03-01 MED ORDER — METRONIDAZOLE 500 MG PO TABS
500.0000 mg | ORAL_TABLET | Freq: Two times a day (BID) | ORAL | 0 refills | Status: DC
Start: 2020-03-01 — End: 2020-03-01

## 2020-03-01 MED ORDER — CIPROFLOXACIN HCL 500 MG PO TABS: 500.0000 mg | ORAL_TABLET | Freq: Two times a day (BID) | ORAL | 0 refills | Status: DC

## 2020-03-01 NOTE — Progress Notes (Addendum)
Subjective:    Patient ID: Veronica Paul, female    DOB: [birth year], 85 y.o.   MRN: [id number]   Chief Complaint: Medical Management of Chronic Issues    HPI:  1. Essential hypertension No c/o chest pain, sob or headache. Does not check blood pressure at home. BP Readings from Last 3 Encounters:  03/01/20 (!) 144/83  11/23/19 (!) 142/74  08/12/19 (!) 158/89     2. Diabetes mellitus without complication ([place]) She does not check her blood sugars at home. She denies feeling of low blood sugars. Her lancet device broke and she cannot prick her finger. Lab Results  Component Value Date   HGBA1C 5.9 11/23/2019     3. Mixed hyperlipidemia Does try to watch diet but does little to no exercise. Lab Results  Component Value Date   CHOL 198 11/23/2019   HDL 55 11/23/2019   LDLCALC 128 (H) 11/23/2019   TRIG 85 11/23/2019   CHOLHDL 3.6 11/23/2019     4. Primary hypertension See above  5. Atrial fibrillation with RVR ([place]) Denies any heart palpitations or heart racing  6. Hypokalemia No c/o lower ext cramping Lab Results  Component Value Date   K 4.0 11/23/2019     7. Peripheral edema Has some swelling daily. She tries to prop them up when she is sitting.  8. Recurrent major depressive disorder, in partial remission ([place]) No recent depression Depression screen [facility] 2/9 03/01/2020 11/23/2019 09/15/2019  Decreased Interest 0 0 0  Down, Depressed, Hopeless 0 0 0  PHQ - 2 Score 0 0 0  Altered sleeping - 0 -  Tired, decreased energy - 0 -  Change in appetite - 0 -  Feeling bad or failure about yourself  - 0 -  Trouble concentrating - 0 -  Moving slowly or fidgety/restless - 0 -  Suicidal thoughts - 0 -  PHQ-9 Score - 0 -  Difficult doing work/chores - Not difficult at all -  Some recent data might be hidden     9. GAD (generalized anxiety disorder) Has some anxiety but is doing well. GAD 7 : Generalized Anxiety Score 03/01/2020 10/22/2018 11/05/2017 10/02/2016   Nervous, Anxious, on Edge _0 0  Control/stop worrying 3 0 3 0  Worry too much - different things 3 0 3 0  Trouble relaxing 0 0 0 0  Restless 0 0 0 0  Easily annoyed or irritable 0 0 0 0  Afraid - awful might happen 2 0 0 0  Total GAD 7 Score _1 0  Anxiety Difficulty Not difficult at all Not difficult at all Not difficult at all Not difficult at all      10. Osteopenia of lumbar spine No weight bearing exercises. dexascan was done 06/2017. T score was -0.6 which was normal.   11. BMI 34.0-34.9,adult No recent weight changes Wt Readings from Last 3 Encounters:  03/01/20 181 lb 9.6 oz (82.4 kg)  11/23/19 176 lb (79.8 kg)  08/12/19 168 lb (76.2 kg)   BMI Readings from Last 3 Encounters:  03/01/20 32.17 kg/m  11/23/19 31.18 kg/m  08/12/19 29.76 kg/m        Outpatient Encounter Medications as of 03/01/2020  Medication Sig  . apixaban (ELIQUIS) 5 MG TABS tablet Take 1 tablet (5 mg total) by mouth 2 (two) times daily.  . Blood Glucose Monitoring Suppl (ONETOUCH VERIO) w/Device KIT Check blood sugars daily Dx E11.9  . diltiazem (CARDIZEM CD) 360 MG 24  hr capsule Take 1 capsule (360 mg total) by mouth daily.  . furosemide (LASIX) 20 MG tablet TAKE 0.5 TABLETS (10 MG TOTAL) BY MOUTH DAILY AS NEEDED. TAKE 1/2 TABLET AS NEEDED 3 TIMES WEEKLY  . levothyroxine (SYNTHROID) 75 MCG tablet Take 1 tablet (75 mcg total) by mouth daily.  . metFORMIN (GLUCOPHAGE) 500 MG tablet Take 1 tablet (500 mg total) by mouth daily.  . metoprolol (TOPROL-XL) 200 MG 24 hr tablet TAKE 1 AND 1/2 TABLET DAILY  . nitroGLYCERIN (NITROSTAT) 0.4 MG SL tablet PLACE 1 TABLET (0.4 MG TOTAL) UNDER THE TONGUE EVERY 5 (FIVE) MINUTES AS NEEDED FOR CHEST PAIN.  Glory Rosebush Delica Lancets 72Z MISC Use to check blood sugar twice daily and as needed  . ONETOUCH VERIO test strip CHECK BLOOD SUGARS DAILY DX E11.9   No facility-administered encounter medications on file as of 03/01/2020.    Past Surgical History:   Procedure Laterality Date  . CATARACT EXTRACTION Bilateral   . COLONOSCOPY    . COLONOSCOPY WITH PROPOFOL N/A 05/06/2019   Procedure: COLONOSCOPY WITH PROPOFOL;  Surgeon: Jerene Bears, MD;  Location: [facility];  Service: Gastroenterology;  Laterality: N/A;  . LUNG REMOVAL, PARTIAL  1998   Right  . POLYPECTOMY  05/06/2019   Procedure: POLYPECTOMY;  Surgeon: Jerene Bears, MD;  Location: [facility];  Service: Gastroenterology;;  . THYROIDECTOMY    . TUBAL LIGATION    . UPPER GASTROINTESTINAL ENDOSCOPY      Family History  Problem Relation Age of Onset  . Coronary artery disease Brother 63  . Hypertension Brother   . Heart disease Brother   . Hypertension Mother   . Hypertension Father   . Heart disease Father   . Hypertension Sister   . Heart disease Sister   . Cancer Sister   . Arthritis Daughter   . COPD Daughter   . Fibromyalgia Daughter   . Ulcerative colitis Son   . Stroke Maternal Grandmother   . Cancer Brother   . Lung cancer Brother   . Hypertension Sister   . Cancer Sister   . Colon cancer Neg Hx   . Food intolerance Neg Hx   . Esophageal cancer Neg Hx   . Rectal cancer Neg Hx   . Stomach cancer Neg Hx   . Colon polyps Neg Hx     New complaints: Still having abdominal paoin. She says it is a squeezing pain. Has been going on for over a year. We have done every test we know to do and sheis still having pain. All test have been negative. She did have hpylori and was treated and she was good for several months after taking antibiotics. We will retreat and see if helps.  Social history: Lives by herself. Her family check son her daily  Controlled substance contract: n/a    Review of Systems  Constitutional: Negative for diaphoresis.  Eyes: Negative for pain.  Respiratory: Negative for shortness of breath.   Cardiovascular: Negative for chest pain, palpitations and leg swelling.  Gastrointestinal: Negative for abdominal pain.  Endocrine: Negative for  polydipsia.  Skin: Negative for rash.  Neurological: Negative for dizziness, weakness and headaches.  Hematological: Does not bruise/bleed easily.  All other systems reviewed and are negative.      Objective:   Physical Exam Vitals and nursing note reviewed.  Constitutional:      General: She is not in acute distress.    Appearance: Normal appearance. She is well-developed and well-nourished.  HENT:     Head: Normocephalic.     Nose: Nose normal.     Mouth/Throat:     Mouth: Oropharynx is clear and moist.  Eyes:     Extraocular Movements: EOM normal.     Pupils: Pupils are equal, round, and reactive to light.  Neck:     Vascular: No carotid bruit or JVD.  Cardiovascular:     Rate and Rhythm: Normal rate. Rhythm irregular.     Pulses: Intact distal pulses.     Heart sounds: Normal heart sounds.  Pulmonary:     Effort: Pulmonary effort is normal. No respiratory distress.     Breath sounds: Normal breath sounds. No wheezing or rales.  Chest:     Chest wall: No tenderness.  Abdominal:     General: Bowel sounds are normal. There is no distension or abdominal bruit. Aorta is normal.     Palpations: Abdomen is soft. There is no hepatomegaly, splenomegaly, mass or pulsatile mass.     Tenderness: There is abdominal tenderness (lower abdomen on deep palpation.).  Musculoskeletal:        General: Normal range of motion.     Cervical back: Normal range of motion and neck supple.     Right lower leg: Edema (1+) present.     Left lower leg: Edema (1+) present.  Lymphadenopathy:     Cervical: No cervical adenopathy.  Skin:    General: Skin is warm and dry.  Neurological:     Mental Status: She is alert and oriented to person, place, and time.     Deep Tendon Reflexes: Reflexes are normal and symmetric.  Psychiatric:        Mood and Affect: Mood and affect normal.        Behavior: Behavior normal.        Thought Content: Thought content normal.        Judgment: Judgment normal.      BP (!) 144/83   Pulse (!) 105   Temp (!) 97.3 F (36.3 C)   Ht _0  (1.6 m)   Wt 181 lb 9.6 oz (82.4 kg)   LMP 05/11/1992   SpO2 98%   BMI 32.17 kg/m        Assessment & Plan:  TYNSLEE BOWLDS comes in today with chief complaint of Medical Management of Chronic Issues   Diagnosis and orders addressed:  1. Essential hypertension Low sodium diet - CMP14+EGFR - CBC with Differential/Platelet  2. Diabetes mellitus without complication ([place]) Watch carbs in diet - Bayer DCA Hb A1c Waived - Microalbumin / creatinine urine ratio - Lancet Devices (ONETOUCH DELICA PLUS LANCING) MISC; 1 Device by Does not apply route daily.  Dispense: 1 each; Refill: 0  3. Mixed hyperlipidemia Low fat diet - Lipid panel  4. Primary hypertension As above  5. Atrial fibrillation with RVR ([facility]) Avoid caffeine  6. Hypokalemia Labs pending  7. Peripheral edema Elevate legs when sitting  8. Recurrent major depressive disorder, in partial remission ([place]) Stress management  9. GAD (generalized anxiety disorder) Back on klonopin - clonazePAM (KLONOPIN) 0.5 MG tablet; Take 1 tablet (0.5 mg total) by mouth 2 (two) times daily as needed for anxiety.  Dispense: 60 tablet; Refill: 2  10. Osteopenia of lumbar spine Weight bearing exercise when can tolerate  11. BMI 34.0-34.9,adult Discussed diet and exercise for person with BMI >25 Will recheck weight in 3-6 months  12. Generalized abdominal pain Will try antibiotics again and seeif will help  with abdominal pain. - ciprofloxacin (CIPRO) 500 MG tablet; Take 1 tablet (500 mg total) by mouth 2 (two) times daily.  Dispense: 14 tablet; Refill: 0 - metroNIDAZOLE (FLAGYL) 500 MG tablet; Take 1 tablet (500 mg total) by mouth 2 (two) times daily.  Dispense: 14 tablet; Refill: 0   Labs pending Health Maintenance reviewed Diet and exercise encouraged  Follow up plan: 3 months   [name], FNP

## 2020-03-01 NOTE — Patient Instructions (Signed)
Fall Prevention in the Home, Adult Falls can cause injuries and can happen to people of all ages. There are many things you can do to make your home safe and to help prevent falls. Ask for help when making these changes. What actions can I take to prevent falls? General Instructions  Use good lighting in all rooms. Replace any light bulbs that burn out.  Turn on the lights in dark areas. Use night-lights.  Keep items that you use often in easy-to-reach places. Lower the shelves around your home if needed.  Set up your furniture so you have a clear path. Avoid moving your furniture around.  Do not have throw rugs or other things on the floor that can make you trip.  Avoid walking on wet floors.  If any of your floors are uneven, fix them.  Add color or contrast paint or tape to clearly mark and help you see: ? Grab bars or handrails. ? First and last steps of staircases. ? Where the edge of each step is.  If you use a stepladder: ? Make sure that it is fully opened. Do not climb a closed stepladder. ? Make sure the sides of the stepladder are locked in place. ? Ask someone to hold the stepladder while you use it.  Know where your pets are when moving through your home. What can I do in the bathroom?  Keep the floor dry. Clean up any water on the floor right away.  Remove soap buildup in the tub or shower.  Use nonskid mats or decals on the floor of the tub or shower.  Attach bath mats securely with double-sided, nonslip rug tape.  If you need to sit down in the shower, use a plastic, nonslip stool.  Install grab bars by the toilet and in the tub and shower. Do not use towel bars as grab bars.      What can I do in the bedroom?  Make sure that you have a light by your bed that is easy to reach.  Do not use any sheets or blankets for your bed that hang to the floor.  Have a firm chair with side arms that you can use for support when you get dressed. What can I do in  the kitchen?  Clean up any spills right away.  If you need to reach something above you, use a step stool with a grab bar.  Keep electrical cords out of the way.  Do not use floor polish or wax that makes floors slippery. What can I do with my stairs?  Do not leave any items on the stairs.  Make sure that you have a light switch at the top and the bottom of the stairs.  Make sure that there are handrails on both sides of the stairs. Fix handrails that are broken or loose.  Install nonslip stair treads on all your stairs.  Avoid having throw rugs at the top or bottom of the stairs.  Choose a carpet that does not hide the edge of the steps on the stairs.  Check carpeting to make sure that it is firmly attached to the stairs. Fix carpet that is loose or worn. What can I do on the outside of my home?  Use bright outdoor lighting.  Fix the edges of walkways and driveways and fix any cracks.  Remove anything that might make you trip as you walk through a door, such as a raised step or threshold.  Trim any   bushes or trees on paths to your home.  Check to see if handrails are loose or broken and that both sides of all steps have handrails.  Install guardrails along the edges of any raised decks and porches.  Clear paths of anything that can make you trip, such as tools or rocks.  Have leaves, snow, or ice cleared regularly.  Use sand or salt on paths during winter.  Clean up any spills in your garage right away. This includes grease or oil spills. What other actions can I take?  Wear shoes that: ? Have a low heel. Do not wear high heels. ? Have rubber bottoms. ? Feel good on your feet and fit well. ? Are closed at the toe. Do not wear open-toe sandals.  Use tools that help you move around if needed. These include: ? Canes. ? Walkers. ? Scooters. ? Crutches.  Review your medicines with your doctor. Some medicines can make you feel dizzy. This can increase your chance  of falling. Ask your doctor what else you can do to help prevent falls. Where to find more information  Centers for Disease Control and Prevention, [employer]: www.cdc.gov  National Institute on Aging: www.nia.nih.gov Contact a doctor if:  You are afraid of falling at home.  You feel weak, drowsy, or dizzy at home.  You fall at home. Summary  There are many simple things that you can do to make your home safe and to help prevent falls.  Ways to make your home safe include removing things that can make you trip and installing grab bars in the bathroom.  Ask for help when making these changes in your home. This information is not intended to replace advice given to you by your health care provider. Make sure you discuss any questions you have with your health care provider. Document Revised: 09/07/2019 Document Reviewed: 09/07/2019 Elsevier Patient Education  2021 [employer].  

## 2020-03-01 NOTE — Addendum Note (Signed)
Addended by: Chevis Pretty on: 03/01/2020 01:45 PM   Modules accepted: Orders

## 2020-03-01 NOTE — Telephone Encounter (Signed)
Pharmacy comment:  Product Backordered/Unavailable:PRODUCT BACKORDERED; PLEASE SEND ALTERNATIVE.

## 2020-03-02 LAB — MICROALBUMIN / CREATININE URINE RATIO
Creatinine, Urine: 47.4 mg/dL
Microalb/Creat Ratio: 234 mg/g creat — ABNORMAL HIGH (ref 0–29)
Microalbumin, Urine: 111 ug/mL

## 2020-03-26 ENCOUNTER — Telehealth: Payer: Self-pay

## 2020-03-26 DIAGNOSIS — E119 Type 2 diabetes mellitus without complications: Secondary | ICD-10-CM

## 2020-03-26 DIAGNOSIS — I10 Essential (primary) hypertension: Secondary | ICD-10-CM

## 2020-03-26 DIAGNOSIS — E039 Hypothyroidism, unspecified: Secondary | ICD-10-CM

## 2020-03-26 DIAGNOSIS — E782 Mixed hyperlipidemia: Secondary | ICD-10-CM

## 2020-03-26 NOTE — Telephone Encounter (Signed)
Orders are in computer.doe snot ave to come early and have done if does not want to

## 2020-03-26 NOTE — Telephone Encounter (Signed)
Aware labs are in.

## 2020-04-19 DIAGNOSIS — D0461 Carcinoma in situ of skin of right upper limb, including shoulder: Secondary | ICD-10-CM | POA: Diagnosis not present

## 2020-04-19 DIAGNOSIS — C44622 Squamous cell carcinoma of skin of right upper limb, including shoulder: Secondary | ICD-10-CM | POA: Diagnosis not present

## 2020-04-19 DIAGNOSIS — L570 Actinic keratosis: Secondary | ICD-10-CM | POA: Diagnosis not present

## 2020-05-03 DIAGNOSIS — I25119 Atherosclerotic heart disease of native coronary artery with unspecified angina pectoris: Secondary | ICD-10-CM | POA: Diagnosis not present

## 2020-05-03 DIAGNOSIS — I130 Hypertensive heart and chronic kidney disease with heart failure and stage 1 through stage 4 chronic kidney disease, or unspecified chronic kidney disease: Secondary | ICD-10-CM | POA: Diagnosis not present

## 2020-05-03 DIAGNOSIS — E039 Hypothyroidism, unspecified: Secondary | ICD-10-CM | POA: Diagnosis not present

## 2020-05-03 DIAGNOSIS — E669 Obesity, unspecified: Secondary | ICD-10-CM | POA: Diagnosis not present

## 2020-05-03 DIAGNOSIS — I4891 Unspecified atrial fibrillation: Secondary | ICD-10-CM | POA: Diagnosis not present

## 2020-05-03 DIAGNOSIS — R69 Illness, unspecified: Secondary | ICD-10-CM | POA: Diagnosis not present

## 2020-05-03 DIAGNOSIS — D6869 Other thrombophilia: Secondary | ICD-10-CM | POA: Diagnosis not present

## 2020-05-03 DIAGNOSIS — E1122 Type 2 diabetes mellitus with diabetic chronic kidney disease: Secondary | ICD-10-CM | POA: Diagnosis not present

## 2020-05-03 DIAGNOSIS — I509 Heart failure, unspecified: Secondary | ICD-10-CM | POA: Diagnosis not present

## 2020-05-18 ENCOUNTER — Other Ambulatory Visit: Payer: Self-pay

## 2020-05-18 ENCOUNTER — Other Ambulatory Visit: Payer: Medicare HMO

## 2020-05-18 DIAGNOSIS — E782 Mixed hyperlipidemia: Secondary | ICD-10-CM | POA: Diagnosis not present

## 2020-05-18 DIAGNOSIS — E119 Type 2 diabetes mellitus without complications: Secondary | ICD-10-CM | POA: Diagnosis not present

## 2020-05-18 DIAGNOSIS — I10 Essential (primary) hypertension: Secondary | ICD-10-CM

## 2020-05-18 DIAGNOSIS — E039 Hypothyroidism, unspecified: Secondary | ICD-10-CM | POA: Diagnosis not present

## 2020-05-18 LAB — BAYER DCA HB A1C WAIVED: HB A1C (BAYER DCA - WAIVED): 5.9 % (ref <7.0)

## 2020-05-19 LAB — CBC WITH DIFFERENTIAL/PLATELET
Basophils Absolute: 0.1 10*3/uL (ref 0.0–0.2)
Basos: 1 %
EOS (ABSOLUTE): 0.2 10*3/uL (ref 0.0–0.4)
Eos: 3 %
Hematocrit: 42.5 % (ref 34.0–46.6)
Hemoglobin: 14.8 g/dL (ref 11.1–15.9)
Immature Grans (Abs): 0 10*3/uL (ref 0.0–0.1)
Immature Granulocytes: 0 %
Lymphocytes Absolute: 2 10*3/uL (ref 0.7–3.1)
Lymphs: 25 %
MCH: 30.4 pg (ref 26.6–33.0)
MCHC: 34.8 g/dL (ref 31.5–35.7)
MCV: 87 fL (ref 79–97)
Monocytes Absolute: 0.8 10*3/uL (ref 0.1–0.9)
Monocytes: 9 %
Neutrophils Absolute: 5.1 10*3/uL (ref 1.4–7.0)
Neutrophils: 62 %
Platelets: 270 10*3/uL (ref 150–450)
RBC: 4.87 x10E6/uL (ref 3.77–5.28)
RDW: 12.7 % (ref 11.7–15.4)
WBC: 8.2 10*3/uL (ref 3.4–10.8)

## 2020-05-19 LAB — THYROID PANEL WITH TSH
Free Thyroxine Index: 2.7 (ref 1.2–4.9)
T3 Uptake Ratio: 31 % (ref 24–39)
T4, Total: 8.7 ug/dL (ref 4.5–12.0)
TSH: 1.16 u[IU]/mL (ref 0.450–4.500)

## 2020-05-19 LAB — LIPID PANEL
Chol/HDL Ratio: 3.3 ratio (ref 0.0–4.4)
Cholesterol, Total: 201 mg/dL — ABNORMAL HIGH (ref 100–199)
HDL: 61 mg/dL (ref >39)
LDL Chol Calc (NIH): 121 mg/dL — ABNORMAL HIGH (ref 0–99)
Triglycerides: 107 mg/dL (ref 0–149)
VLDL Cholesterol Cal: 19 mg/dL (ref 5–40)

## 2020-05-19 LAB — CMP14+EGFR
ALT: 13 IU/L (ref 0–32)
AST: 19 IU/L (ref 0–40)
Albumin/Globulin Ratio: 1.7 (ref 1.2–2.2)
Albumin: 4.5 g/dL (ref 3.6–4.6)
Alkaline Phosphatase: 74 IU/L (ref 44–121)
BUN/Creatinine Ratio: 10 — ABNORMAL LOW (ref 12–28)
BUN: 10 mg/dL (ref 8–27)
Bilirubin Total: 0.6 mg/dL (ref 0.0–1.2)
CO2: 28 mmol/L (ref 20–29)
Calcium: 9.6 mg/dL (ref 8.7–10.3)
Chloride: 102 mmol/L (ref 96–106)
Creatinine, Ser: 0.98 mg/dL (ref 0.57–1.00)
Globulin, Total: 2.7 g/dL (ref 1.5–4.5)
Glucose: 113 mg/dL — ABNORMAL HIGH (ref 65–99)
Potassium: 4.8 mmol/L (ref 3.5–5.2)
Sodium: 143 mmol/L (ref 134–144)
Total Protein: 7.2 g/dL (ref 6.0–8.5)
eGFR: 58 mL/min/{1.73_m2} — ABNORMAL LOW (ref >59)

## 2020-05-21 ENCOUNTER — Other Ambulatory Visit: Payer: Self-pay

## 2020-05-21 ENCOUNTER — Encounter: Payer: Self-pay | Admitting: Nurse Practitioner

## 2020-05-21 ENCOUNTER — Ambulatory Visit (INDEPENDENT_AMBULATORY_CARE_PROVIDER_SITE_OTHER): Payer: Medicare HMO | Admitting: Nurse Practitioner

## 2020-05-21 VITALS — BP 143/80 | HR 85 | Temp 97.2°F | Ht 63.0 in | Wt 183.4 lb

## 2020-05-21 DIAGNOSIS — R600 Localized edema: Secondary | ICD-10-CM

## 2020-05-21 DIAGNOSIS — F3341 Major depressive disorder, recurrent, in partial remission: Secondary | ICD-10-CM

## 2020-05-21 DIAGNOSIS — Z6834 Body mass index (BMI) 34.0-34.9, adult: Secondary | ICD-10-CM | POA: Diagnosis not present

## 2020-05-21 DIAGNOSIS — K5790 Diverticulosis of intestine, part unspecified, without perforation or abscess without bleeding: Secondary | ICD-10-CM

## 2020-05-21 DIAGNOSIS — E782 Mixed hyperlipidemia: Secondary | ICD-10-CM | POA: Diagnosis not present

## 2020-05-21 DIAGNOSIS — E876 Hypokalemia: Secondary | ICD-10-CM

## 2020-05-21 DIAGNOSIS — I10 Essential (primary) hypertension: Secondary | ICD-10-CM

## 2020-05-21 DIAGNOSIS — E119 Type 2 diabetes mellitus without complications: Secondary | ICD-10-CM | POA: Diagnosis not present

## 2020-05-21 DIAGNOSIS — R69 Illness, unspecified: Secondary | ICD-10-CM | POA: Diagnosis not present

## 2020-05-21 DIAGNOSIS — M8588 Other specified disorders of bone density and structure, other site: Secondary | ICD-10-CM | POA: Diagnosis not present

## 2020-05-21 DIAGNOSIS — I4891 Unspecified atrial fibrillation: Secondary | ICD-10-CM | POA: Diagnosis not present

## 2020-05-21 DIAGNOSIS — E039 Hypothyroidism, unspecified: Secondary | ICD-10-CM

## 2020-05-21 DIAGNOSIS — R609 Edema, unspecified: Secondary | ICD-10-CM

## 2020-05-21 DIAGNOSIS — F411 Generalized anxiety disorder: Secondary | ICD-10-CM

## 2020-05-21 MED ORDER — FUROSEMIDE 20 MG PO TABS: ORAL_TABLET | ORAL | 0 refills | Status: DC

## 2020-05-21 MED ORDER — ELIQUIS 5 MG PO TABS: 5.0000 mg | ORAL_TABLET | Freq: Two times a day (BID) | ORAL | 1 refills | Status: DC

## 2020-05-21 MED ORDER — CLONAZEPAM 0.5 MG PO TABS
0.5000 mg | ORAL_TABLET | Freq: Two times a day (BID) | ORAL | 2 refills | Status: DC | PRN
Start: 2020-05-21 — End: 2020-11-21

## 2020-05-21 MED ORDER — METOPROLOL SUCCINATE ER 200 MG PO TB24: ORAL_TABLET | ORAL | 1 refills | Status: DC

## 2020-05-21 MED ORDER — LEVOTHYROXINE SODIUM 75 MCG PO TABS: 75.0000 ug | ORAL_TABLET | Freq: Every day | ORAL | 1 refills | Status: DC

## 2020-05-21 MED ORDER — DILTIAZEM HCL ER COATED BEADS 360 MG PO CP24: 360.0000 mg | ORAL_CAPSULE | Freq: Every day | ORAL | 3 refills | Status: DC

## 2020-05-21 MED ORDER — METFORMIN HCL 500 MG PO TABS
500.0000 mg | ORAL_TABLET | Freq: Every day | ORAL | 1 refills | Status: DC
Start: 2020-05-21 — End: 2020-08-21

## 2020-05-21 NOTE — Progress Notes (Signed)
Subjective:    Patient ID: Veronica Paul, female    DOB: 1935/04/06, 85 y.o.   MRN: [id number]   Chief Complaint: Medical Management of Chronic Issues and Abdominal Pain (Patient states that she has been having daily abdominal pain x 2 years. )    HPI:  1. Primary hypertension Currently on diltiazem, metoprolol, furosemide. Does check BP at home.   2. Diabetes mellitus without complication (HCC) Taking metformin. Does check monitor FBG at home. Runs in 90's to low 100's in the mornings. Limits carbs, sweets.   Lab Results  Component Value Date   HGBA1C 5.9 05/18/2020    3. Mixed hyperlipidemia  Currently controlling with diet.  Lab Results  Component Value Date   CHOL 201 (H) 05/18/2020   HDL 61 05/18/2020   LDLCALC 121 (H) 05/18/2020   TRIG 107 05/18/2020   CHOLHDL 3.3 05/18/2020   The ASCVD Risk score Mikey Bussing DC Jr., et al., 2013) failed to calculate for the following reasons:   The 2013 ASCVD risk score is only valid for ages 21 to 60    4. Acquired hypothyroidism On synthroid. No complications with this. No s/s hypothyroidism.   5. Atrial fibrillation with RVR (HCC) On apixaban for DOAC. Metoprolol and diltiazem for rate control. Follows with cardiology. Denies heart palps, CP.   6. Hypokalemia No c/o muscle cramping, heart palps.   7. Recurrent major depressive disorder, in partial remission ([place]) Feeling well. No issues with depression.  [facility] Visit from 05/21/2020 in [facility]  PHQ-9 Total Score 0      8. GAD (generalized anxiety disorder) Taking PRN clonazepam. Has not felt like she has needed this often.   GAD 7 : Generalized Anxiety Score 05/21/2020 03/01/2020 10/22/2018 11/05/2017  Nervous, Anxious, on Edge '1 3 1 1  ' Control/stop worrying 1 3 0 3  Worry too much - different things 3 3 0 3  Trouble relaxing 0 0 0 0  Restless 0 0 0 0  Easily annoyed or irritable 0 0 0 0  Afraid - awful might happen 1 2 0 0  Total  GAD 7 Score '6 11 1 7  ' Anxiety Difficulty Not difficult at all Not difficult at all Not difficult at all Not difficult at all     9. Osteopenia of lumbar spine Last Dexa -0.22 Jun 2017. Repeat screening today  10. BMI 34.0-34.9,adult Weight is stable.   Wt Readings from Last 3 Encounters:  05/21/20 183 lb 6.4 oz (83.2 kg)  03/01/20 181 lb 9.6 oz (82.4 kg)  11/23/19 176 lb (79.8 kg)   BMI Readings from Last 3 Encounters:  05/21/20 32.49 kg/m  03/01/20 32.17 kg/m  11/23/19 31.18 kg/m    11. Diverticulosis Continues to have abdominal pain. CT showed evidence diverticulosis.She has been trying to manage with miralax every morning. She does not take fiber supplements. She enjoys vegetables.    Outpatient Encounter Medications as of 05/21/2020  Medication Sig  . apixaban (ELIQUIS) 5 MG TABS tablet Take 1 tablet (5 mg total) by mouth 2 (two) times daily.  . Blood Glucose Monitoring Suppl (ONETOUCH VERIO) w/Device KIT Check blood sugars daily Dx E11.9  . clonazePAM (KLONOPIN) 0.5 MG tablet Take 1 tablet (0.5 mg total) by mouth 2 (two) times daily as needed for anxiety.  Marland Kitchen diltiazem (CARDIZEM CD) 360 MG 24 hr capsule Take 1 capsule (360 mg total) by mouth daily.  . furosemide (LASIX) 20 MG tablet TAKE 0.5 TABLETS (10 MG  TOTAL) BY MOUTH DAILY AS NEEDED. TAKE 1/2 TABLET AS NEEDED 3 TIMES WEEKLY  . Lancet Devices (ONETOUCH DELICA PLUS LANCING) MISC 1 Device by Does not apply route daily.  Marland Kitchen levothyroxine (SYNTHROID) 75 MCG tablet Take 1 tablet (75 mcg total) by mouth daily.  . metFORMIN (GLUCOPHAGE) 500 MG tablet Take 1 tablet (500 mg total) by mouth daily.  . metoprolol (TOPROL-XL) 200 MG 24 hr tablet TAKE 1 AND 1/2 TABLET DAILY  . nitroGLYCERIN (NITROSTAT) 0.4 MG SL tablet PLACE 1 TABLET (0.4 MG TOTAL) UNDER THE TONGUE EVERY 5 (FIVE) MINUTES AS NEEDED FOR CHEST PAIN.  Glory Rosebush Delica Lancets 69C MISC Use to check blood sugar twice daily and as needed  . ONETOUCH VERIO test strip CHECK  BLOOD SUGARS DAILY DX E11.9  . [DISCONTINUED] ciprofloxacin (CIPRO) 500 MG tablet TAKE 1 TABLET BY MOUTH TWICE A DAY   No facility-administered encounter medications on file as of 05/21/2020.    Past Surgical History:  Procedure Laterality Date  . CATARACT EXTRACTION Bilateral   . COLONOSCOPY    . COLONOSCOPY WITH PROPOFOL N/A 05/06/2019   Procedure: COLONOSCOPY WITH PROPOFOL;  Surgeon: Jerene Bears, MD;  Location: [facility];  Service: Gastroenterology;  Laterality: N/A;  . LUNG REMOVAL, PARTIAL  1998   Right  . POLYPECTOMY  05/06/2019   Procedure: POLYPECTOMY;  Surgeon: Jerene Bears, MD;  Location: [facility];  Service: Gastroenterology;;  . THYROIDECTOMY    . TUBAL LIGATION    . UPPER GASTROINTESTINAL ENDOSCOPY      Family History  Problem Relation Age of Onset  . Coronary artery disease Brother 1  . Hypertension Brother   . Heart disease Brother   . Hypertension Mother   . Hypertension Father   . Heart disease Father   . Hypertension Sister   . Heart disease Sister   . Cancer Sister   . Arthritis Daughter   . COPD Daughter   . Fibromyalgia Daughter   . Ulcerative colitis Son   . Stroke Maternal Grandmother   . Cancer Brother   . Lung cancer Brother   . Hypertension Sister   . Cancer Sister   . Colon cancer Neg Hx   . Food intolerance Neg Hx   . Esophageal cancer Neg Hx   . Rectal cancer Neg Hx   . Stomach cancer Neg Hx   . Colon polyps Neg Hx     New complaints: None  Social history: Lives by herself. Her daughters check on her daily.   Controlled substance contract: 05/21/20   Review of Systems  Respiratory: Negative for cough, chest tightness, shortness of breath and wheezing.   Cardiovascular: Negative for chest pain and palpitations.  Gastrointestinal: Positive for abdominal pain. Negative for constipation and diarrhea.  Genitourinary: Positive for urgency. Negative for difficulty urinating, dysuria and hematuria.  Neurological: Negative for  dizziness, syncope and headaches.  Psychiatric/Behavioral: Negative for confusion. The patient is not nervous/anxious.    Vitals:   05/21/20 1137 05/21/20 1138  BP: (!) 146/75 (!) 143/80  Pulse: 85   Temp: (!) 97.2 F (36.2 C)   SpO2: 96%        Objective:   Physical Exam Constitutional:      Appearance: She is obese.  Neck:     Vascular: No carotid bruit.  Cardiovascular:     Rate and Rhythm: Normal rate. Rhythm irregular.     Heart sounds: Normal heart sounds.  Pulmonary:     Effort: Pulmonary effort is normal.  Breath sounds: Normal breath sounds.  Abdominal:     General: Bowel sounds are increased.     Palpations: Abdomen is soft.     Tenderness: There is abdominal tenderness in the periumbilical area and suprapubic area.  Musculoskeletal:     Cervical back: Normal range of motion and neck supple.     Right lower leg: 2+ Edema present.     Left lower leg: 2+ Edema present.  Skin:    General: Skin is warm and dry.     Capillary Refill: Capillary refill takes less than 2 seconds.  Neurological:     Mental Status: She is alert and oriented to person, place, and time.  Psychiatric:        Mood and Affect: Mood normal.        Behavior: Behavior normal.       Assessment & Plan:   LETESHA KLECKER comes in today with chief complaint of Medical Management of Chronic Issues and Abdominal Pain (Patient states that she has been having daily abdominal pain x 2 years. )   Diagnosis and orders addressed:  1. Primary hypertension Continue medication as prescribed. Continue to monitor BP at home.   - diltiazem (CARDIZEM CD) 360 MG 24 hr capsule; Take 1 capsule (360 mg total) by mouth daily.  Dispense: 90 capsule; Refill: 3 - metoprolol (TOPROL-XL) 200 MG 24 hr tablet; TAKE 1 AND 1/2 TABLET DAILY  Dispense: 135 tablet; Refill: 1  2. Diabetes mellitus without complication ([place]) Continue low carb diet and medication as prescribed.  - metFORMIN (GLUCOPHAGE) 500 MG tablet;  Take 1 tablet (500 mg total) by mouth daily.  Dispense: 90 tablet; Refill: 1  3. Mixed hyperlipidemia Continue to control with low fat diet.   4. Acquired hypothyroidism Continue synthroid.  - levothyroxine (SYNTHROID) 75 MCG tablet; Take 1 tablet (75 mcg total) by mouth daily.  Dispense: 90 tablet; Refill: 1  5. Atrial fibrillation with RVR (HCC) Continue medications as prescribed. Continue to follow up with cardiology.  - apixaban (ELIQUIS) 5 MG TABS tablet; Take 1 tablet (5 mg total) by mouth 2 (two) times daily.  Dispense: 180 tablet; Refill: 1  6. Hypokalemia No issues at this time.   7. Recurrent major depressive disorder, in partial remission ([place]) No acute issues at this time.   8. GAD (generalized anxiety disorder) Continue PRN clonazepam.  - clonazePAM (KLONOPIN) 0.5 MG tablet; Take 1 tablet (0.5 mg total) by mouth 2 (two) times daily as needed for anxiety.  Dispense: 60 tablet; Refill: 2  9. Osteopenia of lumbar spine Repeat dexascan  10. BMI 34.0-34.9,adult Weight stable.   11. Diverticulosis Follow up with GYN for further evaluation of abd pain.   12. Peripheral edema Avoid excess sodium.   - furosemide (LASIX) 20 MG tablet; TAKE 0.5 TABLETS (10 MG TOTAL) BY MOUTH DAILY AS NEEDED. TAKE 1/2 TABLET AS NEEDED 3 TIMES WEEKLY  Dispense: 90 tablet; Refill: 0   Labs pending Health Maintenance reviewed Diet and exercise encouraged  Follow up plan: 3 months   Dollene Primrose, RN, BSN, FNP-Student  [name], FNP

## 2020-05-21 NOTE — Patient Instructions (Signed)
Fall Prevention in the Home, Adult Falls can cause injuries and can happen to people of all ages. There are many things you can do to make your home safe and to help prevent falls. Ask for help when making these changes. What actions can I take to prevent falls? General Instructions  Use good lighting in all rooms. Replace any light bulbs that burn out.  Turn on the lights in dark areas. Use night-lights.  Keep items that you use often in easy-to-reach places. Lower the shelves around your home if needed.  Set up your furniture so you have a clear path. Avoid moving your furniture around.  Do not have throw rugs or other things on the floor that can make you trip.  Avoid walking on wet floors.  If any of your floors are uneven, fix them.  Add color or contrast paint or tape to clearly mark and help you see: ? Grab bars or handrails. ? First and last steps of staircases. ? Where the edge of each step is.  If you use a stepladder: ? Make sure that it is fully opened. Do not climb a closed stepladder. ? Make sure the sides of the stepladder are locked in place. ? Ask someone to hold the stepladder while you use it.  Know where your pets are when moving through your home. What can I do in the bathroom?  Keep the floor dry. Clean up any water on the floor right away.  Remove soap buildup in the tub or shower.  Use nonskid mats or decals on the floor of the tub or shower.  Attach bath mats securely with double-sided, nonslip rug tape.  If you need to sit down in the shower, use a plastic, nonslip stool.  Install grab bars by the toilet and in the tub and shower. Do not use towel bars as grab bars.      What can I do in the bedroom?  Make sure that you have a light by your bed that is easy to reach.  Do not use any sheets or blankets for your bed that hang to the floor.  Have a firm chair with side arms that you can use for support when you get dressed. What can I do in  the kitchen?  Clean up any spills right away.  If you need to reach something above you, use a step stool with a grab bar.  Keep electrical cords out of the way.  Do not use floor polish or wax that makes floors slippery. What can I do with my stairs?  Do not leave any items on the stairs.  Make sure that you have a light switch at the top and the bottom of the stairs.  Make sure that there are handrails on both sides of the stairs. Fix handrails that are broken or loose.  Install nonslip stair treads on all your stairs.  Avoid having throw rugs at the top or bottom of the stairs.  Choose a carpet that does not hide the edge of the steps on the stairs.  Check carpeting to make sure that it is firmly attached to the stairs. Fix carpet that is loose or worn. What can I do on the outside of my home?  Use bright outdoor lighting.  Fix the edges of walkways and driveways and fix any cracks.  Remove anything that might make you trip as you walk through a door, such as a raised step or threshold.  Trim any   bushes or trees on paths to your home.  Check to see if handrails are loose or broken and that both sides of all steps have handrails.  Install guardrails along the edges of any raised decks and porches.  Clear paths of anything that can make you trip, such as tools or rocks.  Have leaves, snow, or ice cleared regularly.  Use sand or salt on paths during winter.  Clean up any spills in your garage right away. This includes grease or oil spills. What other actions can I take?  Wear shoes that: ? Have a low heel. Do not wear high heels. ? Have rubber bottoms. ? Feel good on your feet and fit well. ? Are closed at the toe. Do not wear open-toe sandals.  Use tools that help you move around if needed. These include: ? Canes. ? Walkers. ? Scooters. ? Crutches.  Review your medicines with your doctor. Some medicines can make you feel dizzy. This can increase your chance  of falling. Ask your doctor what else you can do to help prevent falls. Where to find more information  Centers for Disease Control and Prevention, [employer]: www.cdc.gov  National Institute on Aging: www.nia.nih.gov Contact a doctor if:  You are afraid of falling at home.  You feel weak, drowsy, or dizzy at home.  You fall at home. Summary  There are many simple things that you can do to make your home safe and to help prevent falls.  Ways to make your home safe include removing things that can make you trip and installing grab bars in the bathroom.  Ask for help when making these changes in your home. This information is not intended to replace advice given to you by your health care provider. Make sure you discuss any questions you have with your health care provider. Document Revised: 09/07/2019 Document Reviewed: 09/07/2019 Elsevier Patient Education  2021 [employer].  

## 2020-07-10 NOTE — Progress Notes (Signed)
Cardiology Office Note   Date:  07/11/2020   ID:  Veronica Paul [birth year], MRN [id number]  PCP:  Veronica Pretty, FNP  Cardiologist:   Minus Breeding, MD   Chief Complaint  Patient presents with  . Atrial Fibrillation      History of Present Illness: Veronica Paul is a 85 y.o. female who presents for follow up of atrial fib.  An episode of rapid heart rate and hypertension today.  She called her children.  They checked her blood pressures and the record demonstrated she was from the 802M systolic to the 336P.  Her heart rate was running in the 120s to 130s.  This lasted for several hours.  Apparently she was anxious but did not want to take her Klonopin.  She was also having knee pain.  She has had some mildly elevated blood pressures since then but is otherwise been fine.  Of note she may have forgotten to take her Cardizem that day and its not clear.  She still has some unexplained chronic abdominal pain after work-up.  She is not having any new chest pressure, neck or arm discomfort.  She is not having any new shortness of breath, PND or orthopnea.  She has had no weight gain or edema.   Past Medical History:  Diagnosis Date  . Allergy    "my nose runs alot"  . Anxiety   . Arthritis   . Atrial fibrillation ([place])   . Blood transfusion without reported diagnosis    "when I had a miscarrage in 1957"  . Cataract    bilateral removed  . Cholelithiasis   . Constipation    x ray showed increased stool in colon- uses Miralax daily and Dulcolax twice a week   . Depression   . Diabetes mellitus   . Dyslipidemia   . HTN (hypertension)   . Hx of long-term (current) use of anticoagulants   . Hyperlipidemia   . Hypertension   . Hypothyroidism   . Lung cancer ([place])   . Obesity   . Osteopenia   . Vitamin D deficiency     Past Surgical History:  Procedure Laterality Date  . CATARACT EXTRACTION Bilateral   . COLONOSCOPY    . COLONOSCOPY WITH PROPOFOL N/A  05/06/2019   Procedure: COLONOSCOPY WITH PROPOFOL;  Surgeon: Jerene Bears, MD;  Location: [facility];  Service: Gastroenterology;  Laterality: N/A;  . LUNG REMOVAL, PARTIAL  1998   Right  . POLYPECTOMY  05/06/2019   Procedure: POLYPECTOMY;  Surgeon: Jerene Bears, MD;  Location: [facility];  Service: Gastroenterology;;  . THYROIDECTOMY    . TUBAL LIGATION    . UPPER GASTROINTESTINAL ENDOSCOPY       Current Outpatient Medications  Medication Sig Dispense Refill  . apixaban (ELIQUIS) 5 MG TABS tablet Take 1 tablet (5 mg total) by mouth 2 (two) times daily. 180 tablet 1  . Blood Glucose Monitoring Suppl (ONETOUCH VERIO) w/Device KIT Check blood sugars daily Dx E11.9 1 kit 0  . clonazePAM (KLONOPIN) 0.5 MG tablet Take 1 tablet (0.5 mg total) by mouth 2 (two) times daily as needed for anxiety. 60 tablet 2  . diltiazem (CARDIZEM CD) 360 MG 24 hr capsule Take 1 capsule (360 mg total) by mouth daily. 90 capsule 3  . diltiazem (CARDIZEM) 30 MG tablet Take 1 tablet (30 mg total) by mouth 2 (two) times daily as needed. 30 tablet 3  . furosemide (LASIX) 20 MG tablet TAKE 0.5  TABLETS (10 MG TOTAL) BY MOUTH DAILY AS NEEDED. TAKE 1/2 TABLET AS NEEDED 3 TIMES WEEKLY 90 tablet 0  . Lancet Devices (ONETOUCH DELICA PLUS LANCING) MISC 1 Device by Does not apply route daily. 1 each 0  . levothyroxine (SYNTHROID) 75 MCG tablet Take 1 tablet (75 mcg total) by mouth daily. 90 tablet 1  . metFORMIN (GLUCOPHAGE) 500 MG tablet Take 1 tablet (500 mg total) by mouth daily. 90 tablet 1  . metoprolol (TOPROL-XL) 200 MG 24 hr tablet TAKE 1 AND 1/2 TABLET DAILY 135 tablet 1  . nitroGLYCERIN (NITROSTAT) 0.4 MG SL tablet PLACE 1 TABLET (0.4 MG TOTAL) UNDER THE TONGUE EVERY 5 (FIVE) MINUTES AS NEEDED FOR CHEST PAIN. 25 tablet 1  . OneTouch Delica Lancets 41L MISC Use to check blood sugar twice daily and as needed 100 each 2  . ONETOUCH VERIO test strip CHECK BLOOD SUGARS DAILY DX E11.9 100 strip 3   No current  facility-administered medications for this visit.    Allergies:   Ace inhibitors, Feldene [piroxicam], Meloxicam, Prevnar [pneumococcal 13-val conj vacc], Statins, Sulfa antibiotics, and Zithromax [azithromycin dihydrate]    ROS:  Please see the history of present illness.   Otherwise, review of systems are positive for none.   All other systems are reviewed and negative.    PHYSICAL EXAM: VS:  BP (!) 154/71   Pulse 91   Ht _0  (1.6 m)   Wt 182 lb (82.6 kg)   LMP 05/11/1992   BMI 32.24 kg/m  , BMI Body mass index is 32.24 kg/m. GENERAL:  Well appearing NECK:  No jugular venous distention, waveform within normal limits, carotid upstroke brisk and symmetric, no bruits, no thyromegaly LUNGS:  Clear to auscultation bilaterally CHEST:  Unremarkable HEART:  PMI not displaced or sustained,S1 and S2 within normal limits, no S3, no clicks, no rubs, no murmurs, irregular ABD:  Flat, positive bowel sounds normal in frequency in pitch, no bruits, no rebound, no guarding, no midline pulsatile mass, no hepatomegaly, no splenomegaly EXT:  2 plus pulses throughout, no edema, no cyanosis no clubbing    EKG:  EKG is ordered today. Atrial fibrillation, rate 91, axis within normal limits, intervals within normal limits, no acute ST-T wave changes.  Recent Labs: 05/18/2020: ALT 13; BUN 10; Creatinine, Ser 0.98; Hemoglobin 14.8; Platelets 270; Potassium 4.8; Sodium 143; TSH 1.160    Lipid Panel    Component Value Date/Time   CHOL 201 (H) 05/18/2020 0924   CHOL 100 05/07/2012 0957   TRIG 107 05/18/2020 0924   TRIG 148 06/21/2014 0932   TRIG 150 (H) 05/07/2012 0957   HDL 61 05/18/2020 0924   HDL 56 06/21/2014 0932   HDL 40 05/07/2012 0957   CHOLHDL 3.3 05/18/2020 0924   LDLCALC 121 (H) 05/18/2020 0924   LDLCALC 49 12/02/2013 0923   LDLCALC 30 05/07/2012 0957      Wt Readings from Last 3 Encounters:  07/11/20 182 lb (82.6 kg)  05/21/20 183 lb 6.4 oz (83.2 kg)  03/01/20 181 lb 9.6 oz  (82.4 kg)      Other studies Reviewed: Additional studies/ records that were reviewed today include: Extensive review of blood pressure and heart rate recordings Review of the above records demonstrates:  Please see elsewhere in the note.     ASSESSMENT AND PLAN:  Atrial fibrillation:    Veronica Paul has a CHA2DS2 - VASc score of 5.   Her rate has not been well controlled at  times.  We talked about managing this with anxiolytics and pain control.  I am also going to give her a prescription for Cardizem 30 mg as needed.  Abdominal pain:   There is no clear etiology but I do not think this is a cardiac equivalent.  No change in therapy.  Coronary artery calcium:   She had a negative Lexiscan Myoview.  No further work-up  Hypertension:   The blood pressure is not at target.  They are going to keep a blood pressure diary.  I might need to add twice daily hydralazine if blood pressure is not well controlled.   Chronic constipation:   This has improved.  No change in therapy.   Current medicines are reviewed at length with the patient today.  The patient does not have concerns regarding medicines.  The following changes have been made:  As above  Labs/ tests ordered today include:   Orders Placed This Encounter  Procedures  . EKG 12-Lead     Disposition:   FU with me in six months.     Signed, Minus Breeding, MD  07/11/2020 5:15 PM    [facility]

## 2020-07-11 ENCOUNTER — Encounter: Payer: Self-pay | Admitting: Cardiology

## 2020-07-11 ENCOUNTER — Ambulatory Visit (INDEPENDENT_AMBULATORY_CARE_PROVIDER_SITE_OTHER): Payer: Medicare HMO | Admitting: Cardiology

## 2020-07-11 ENCOUNTER — Other Ambulatory Visit: Payer: Self-pay

## 2020-07-11 VITALS — BP 154/71 | HR 91 | Ht 63.0 in | Wt 182.0 lb

## 2020-07-11 DIAGNOSIS — I4891 Unspecified atrial fibrillation: Secondary | ICD-10-CM | POA: Diagnosis not present

## 2020-07-11 DIAGNOSIS — R931 Abnormal findings on diagnostic imaging of heart and coronary circulation: Secondary | ICD-10-CM

## 2020-07-11 MED ORDER — DILTIAZEM HCL 30 MG PO TABS: 30.0000 mg | ORAL_TABLET | Freq: Two times a day (BID) | ORAL | 3 refills | Status: DC | PRN

## 2020-07-11 NOTE — Patient Instructions (Addendum)
Medication Instructions:  You may take Cardizem 30 mg twice a day as needed. You may take Tylenol for pain as needed.  Please avoid using Ibuprofen. Continue all other medications as listed.  *If you need a refill on your cardiac medications before your next appointment, please call your pharmacy*  Follow-Up: At [facility], you and your health needs are our priority.  As part of our continuing mission to provide you with exceptional heart care, we have created designated Provider Care Teams.  These Care Teams include your primary Cardiologist (physician) and Advanced Practice Providers (APPs -  Physician Assistants and Nurse Practitioners) who all work together to provide you with the care you need, when you need it.  We recommend signing up for the patient portal called "MyChart".  Sign up information is provided on this After Visit Summary.  MyChart is used to connect with patients for Virtual Visits (Telemedicine).  Patients are able to view lab/test results, encounter notes, upcoming appointments, etc.  Non-urgent messages can be sent to your provider as well.   To learn more about what you can do with MyChart, go to [email or web address].    Your next appointment:   6 month(s)  The format for your next appointment:   In Person  Provider:   Minus Breeding, MD   Thank you for choosing [facility]!!

## 2020-07-24 ENCOUNTER — Other Ambulatory Visit: Payer: Self-pay | Admitting: *Deleted

## 2020-07-24 MED ORDER — DILTIAZEM HCL 30 MG PO TABS: 30.0000 mg | ORAL_TABLET | Freq: Two times a day (BID) | ORAL | 5 refills | Status: DC | PRN

## 2020-08-01 ENCOUNTER — Telehealth: Payer: Self-pay | Admitting: Nurse Practitioner

## 2020-08-01 ENCOUNTER — Other Ambulatory Visit: Payer: Self-pay

## 2020-08-01 DIAGNOSIS — I10 Essential (primary) hypertension: Secondary | ICD-10-CM

## 2020-08-01 DIAGNOSIS — E119 Type 2 diabetes mellitus without complications: Secondary | ICD-10-CM

## 2020-08-01 DIAGNOSIS — E782 Mixed hyperlipidemia: Secondary | ICD-10-CM

## 2020-08-01 NOTE — Telephone Encounter (Signed)
Spoke with daughter and advised that patient would need lab work and she wants to bring pt a few days before. Advised I would place orders so they could come in before appt. Daughter verbalized understanding

## 2020-08-01 NOTE — Telephone Encounter (Signed)
Pts daughter wants to know if pt will need to have lab work done at her visit on 08/21/20? If so, daughter said she would bring pt in a few days before her appt.  Please advise and call daughter [phone]

## 2020-08-16 ENCOUNTER — Other Ambulatory Visit: Payer: Self-pay

## 2020-08-16 ENCOUNTER — Other Ambulatory Visit: Payer: Medicare HMO

## 2020-08-16 DIAGNOSIS — E782 Mixed hyperlipidemia: Secondary | ICD-10-CM | POA: Diagnosis not present

## 2020-08-16 DIAGNOSIS — I10 Essential (primary) hypertension: Secondary | ICD-10-CM

## 2020-08-16 DIAGNOSIS — E119 Type 2 diabetes mellitus without complications: Secondary | ICD-10-CM | POA: Diagnosis not present

## 2020-08-16 LAB — BAYER DCA HB A1C WAIVED: HB A1C (BAYER DCA - WAIVED): 5.6 % (ref <7.0)

## 2020-08-17 LAB — LIPID PANEL
Chol/HDL Ratio: 3.5 ratio (ref 0.0–4.4)
Cholesterol, Total: 195 mg/dL (ref 100–199)
HDL: 56 mg/dL (ref >39)
LDL Chol Calc (NIH): 121 mg/dL — ABNORMAL HIGH (ref 0–99)
Triglycerides: 99 mg/dL (ref 0–149)
VLDL Cholesterol Cal: 18 mg/dL (ref 5–40)

## 2020-08-17 LAB — CMP14+EGFR
ALT: 14 IU/L (ref 0–32)
AST: 19 IU/L (ref 0–40)
Albumin/Globulin Ratio: 2.4 — ABNORMAL HIGH (ref 1.2–2.2)
Albumin: 4.7 g/dL — ABNORMAL HIGH (ref 3.6–4.6)
Alkaline Phosphatase: 71 IU/L (ref 44–121)
BUN/Creatinine Ratio: 9 — ABNORMAL LOW (ref 12–28)
BUN: 9 mg/dL (ref 8–27)
Bilirubin Total: 0.5 mg/dL (ref 0.0–1.2)
CO2: 29 mmol/L (ref 20–29)
Calcium: 9.5 mg/dL (ref 8.7–10.3)
Chloride: 102 mmol/L (ref 96–106)
Creatinine, Ser: 0.97 mg/dL (ref 0.57–1.00)
Globulin, Total: 2 g/dL (ref 1.5–4.5)
Glucose: 109 mg/dL — ABNORMAL HIGH (ref 65–99)
Potassium: 4.5 mmol/L (ref 3.5–5.2)
Sodium: 143 mmol/L (ref 134–144)
Total Protein: 6.7 g/dL (ref 6.0–8.5)
eGFR: 58 mL/min/{1.73_m2} — ABNORMAL LOW (ref >59)

## 2020-08-17 LAB — CBC WITH DIFFERENTIAL/PLATELET
Basophils Absolute: 0.1 10*3/uL (ref 0.0–0.2)
Basos: 1 %
EOS (ABSOLUTE): 0.5 10*3/uL — ABNORMAL HIGH (ref 0.0–0.4)
Eos: 6 %
Hematocrit: 43.2 % (ref 34.0–46.6)
Hemoglobin: 14.2 g/dL (ref 11.1–15.9)
Immature Grans (Abs): 0 10*3/uL (ref 0.0–0.1)
Immature Granulocytes: 0 %
Lymphocytes Absolute: 1.9 10*3/uL (ref 0.7–3.1)
Lymphs: 24 %
MCH: 29.4 pg (ref 26.6–33.0)
MCHC: 32.9 g/dL (ref 31.5–35.7)
MCV: 89 fL (ref 79–97)
Monocytes Absolute: 0.7 10*3/uL (ref 0.1–0.9)
Monocytes: 9 %
Neutrophils Absolute: 4.7 10*3/uL (ref 1.4–7.0)
Neutrophils: 60 %
Platelets: 267 10*3/uL (ref 150–450)
RBC: 4.83 x10E6/uL (ref 3.77–5.28)
RDW: 13.4 % (ref 11.7–15.4)
WBC: 7.9 10*3/uL (ref 3.4–10.8)

## 2020-08-21 ENCOUNTER — Ambulatory Visit (INDEPENDENT_AMBULATORY_CARE_PROVIDER_SITE_OTHER): Payer: Medicare HMO | Admitting: Nurse Practitioner

## 2020-08-21 ENCOUNTER — Encounter: Payer: Self-pay | Admitting: Nurse Practitioner

## 2020-08-21 ENCOUNTER — Other Ambulatory Visit: Payer: Self-pay

## 2020-08-21 VITALS — BP 133/70 | HR 67 | Temp 97.3°F | Resp 20 | Ht 63.0 in | Wt 182.0 lb

## 2020-08-21 DIAGNOSIS — Z6834 Body mass index (BMI) 34.0-34.9, adult: Secondary | ICD-10-CM

## 2020-08-21 DIAGNOSIS — F411 Generalized anxiety disorder: Secondary | ICD-10-CM

## 2020-08-21 DIAGNOSIS — E782 Mixed hyperlipidemia: Secondary | ICD-10-CM | POA: Diagnosis not present

## 2020-08-21 DIAGNOSIS — R69 Illness, unspecified: Secondary | ICD-10-CM | POA: Diagnosis not present

## 2020-08-21 DIAGNOSIS — E039 Hypothyroidism, unspecified: Secondary | ICD-10-CM | POA: Diagnosis not present

## 2020-08-21 DIAGNOSIS — I10 Essential (primary) hypertension: Secondary | ICD-10-CM | POA: Diagnosis not present

## 2020-08-21 DIAGNOSIS — R609 Edema, unspecified: Secondary | ICD-10-CM

## 2020-08-21 DIAGNOSIS — E119 Type 2 diabetes mellitus without complications: Secondary | ICD-10-CM

## 2020-08-21 DIAGNOSIS — I4891 Unspecified atrial fibrillation: Secondary | ICD-10-CM | POA: Diagnosis not present

## 2020-08-21 DIAGNOSIS — K582 Mixed irritable bowel syndrome: Secondary | ICD-10-CM

## 2020-08-21 MED ORDER — ESCITALOPRAM OXALATE 10 MG PO TABS: 10.0000 mg | ORAL_TABLET | Freq: Every day | ORAL | 1 refills | Status: DC

## 2020-08-21 MED ORDER — METFORMIN HCL 500 MG PO TABS: 500.0000 mg | ORAL_TABLET | Freq: Every day | ORAL | 1 refills | Status: DC

## 2020-08-21 MED ORDER — FUROSEMIDE 20 MG PO TABS: ORAL_TABLET | ORAL | 1 refills | Status: DC

## 2020-08-21 MED ORDER — ESCITALOPRAM OXALATE 10 MG PO TABS
10.0000 mg | ORAL_TABLET | Freq: Every day | ORAL | 1 refills | Status: DC
Start: 2020-08-21 — End: 2020-08-21

## 2020-08-21 MED ORDER — APIXABAN 5 MG PO TABS: 5.0000 mg | ORAL_TABLET | Freq: Two times a day (BID) | ORAL | 1 refills | Status: DC

## 2020-08-21 MED ORDER — LEVOTHYROXINE SODIUM 75 MCG PO TABS: 75.0000 ug | ORAL_TABLET | Freq: Every day | ORAL | 1 refills | Status: DC

## 2020-08-21 MED ORDER — FUROSEMIDE 20 MG PO TABS: 20.0000 mg | ORAL_TABLET | Freq: Every day | ORAL | 1 refills | Status: DC

## 2020-08-21 NOTE — Progress Notes (Signed)
Subjective:    Patient ID: Veronica Paul, female    DOB: 18-Jun-1935, 85 y.o.   MRN: [id number]   Chief Complaint: No chief complaint on file.    HPI:  1. Primary hypertension BP Readings from Last 3 Encounters:  08/21/20 133/70  07/11/20 (!) 154/71  05/21/20 (!) 143/80     2. Irritable bowel syndrome with both constipation and diarrhea Takes medication as prescribed. Takes multiple OTC for constipation that helps. No new or worsening symptoms.   3. Diabetes mellitus without complication [facility]) Lab Results  Component Value Date   HGBA1C 5.6 08/16/2020     4. Mixed hyperlipidemia Lab Results  Component Value Date   CHOL 195 08/16/2020   HDL 56 08/16/2020   LDLCALC 121 (H) 08/16/2020   TRIG 99 08/16/2020   CHOLHDL 3.5 08/16/2020     5. BMI 34.0-34.9,adult Wt Readings from Last 3 Encounters:  08/21/20 182 lb (82.6 kg)  07/11/20 182 lb (82.6 kg)  05/21/20 183 lb 6.4 oz (83.2 kg)   BMI Readings from Last 3 Encounters:  08/21/20 32.24 kg/m  07/11/20 32.24 kg/m  05/21/20 32.49 kg/m     6. GAD (generalized anxiety disorder) GAD 7 : Generalized Anxiety Score 08/21/2020 05/21/2020 03/01/2020 10/22/2018  Nervous, Anxious, on Edge '1 1 3 1  ' Control/stop worrying '1 1 3 ' 0  Worry too much - different things '1 3 3 ' 0  Trouble relaxing 0 0 0 0  Restless 0 0 0 0  Easily annoyed or irritable 0 0 0 0  Afraid - awful might happen 0 1 2 0  Total GAD 7 Score '3 6 11 1  ' Anxiety Difficulty Not difficult at all Not difficult at all Not difficult at all Not difficult at all   7. Peripheral Edema States some increased swelling in bilat lower legs. Takes 1/2 tab lasix every other day.    Outpatient Encounter Medications as of 08/21/2020  Medication Sig   apixaban (ELIQUIS) 5 MG TABS tablet Take 1 tablet (5 mg total) by mouth 2 (two) times daily.   Blood Glucose Monitoring Suppl (ONETOUCH VERIO) w/Device KIT Check blood sugars daily Dx E11.9   clonazePAM (KLONOPIN) 0.5 MG tablet  Take 1 tablet (0.5 mg total) by mouth 2 (two) times daily as needed for anxiety.   diltiazem (CARDIZEM CD) 360 MG 24 hr capsule Take 1 capsule (360 mg total) by mouth daily.   diltiazem (CARDIZEM) 30 MG tablet Take 1 tablet (30 mg total) by mouth 2 (two) times daily as needed.   furosemide (LASIX) 20 MG tablet TAKE 0.5 TABLETS (10 MG TOTAL) BY MOUTH DAILY AS NEEDED. TAKE 1/2 TABLET AS NEEDED 3 TIMES WEEKLY   Lancet Devices (ONETOUCH DELICA PLUS LANCING) MISC 1 Device by Does not apply route daily.   levothyroxine (SYNTHROID) 75 MCG tablet Take 1 tablet (75 mcg total) by mouth daily.   metFORMIN (GLUCOPHAGE) 500 MG tablet Take 1 tablet (500 mg total) by mouth daily.   metoprolol (TOPROL-XL) 200 MG 24 hr tablet TAKE 1 AND 1/2 TABLET DAILY   nitroGLYCERIN (NITROSTAT) 0.4 MG SL tablet PLACE 1 TABLET (0.4 MG TOTAL) UNDER THE TONGUE EVERY 5 (FIVE) MINUTES AS NEEDED FOR CHEST PAIN.   OneTouch Delica Lancets 66K MISC Use to check blood sugar twice daily and as needed   ONETOUCH VERIO test strip CHECK BLOOD SUGARS DAILY DX E11.9   No facility-administered encounter medications on file as of 08/21/2020.    Past Surgical History:  Procedure Laterality Date  CATARACT EXTRACTION Bilateral    COLONOSCOPY     COLONOSCOPY WITH PROPOFOL N/A 05/06/2019   Procedure: COLONOSCOPY WITH PROPOFOL;  Surgeon: Jerene Bears, MD;  Location: [facility];  Service: Gastroenterology;  Laterality: N/A;   LUNG REMOVAL, PARTIAL  1998   Right   POLYPECTOMY  05/06/2019   Procedure: POLYPECTOMY;  Surgeon: Jerene Bears, MD;  Location: [facility];  Service: Gastroenterology;;   THYROIDECTOMY     TUBAL LIGATION     UPPER GASTROINTESTINAL ENDOSCOPY      Family History  Problem Relation Age of Onset   Coronary artery disease Brother 69   Hypertension Brother    Heart disease Brother    Hypertension Mother    Hypertension Father    Heart disease Father    Hypertension Sister    Heart disease Sister    Cancer Sister     Arthritis Daughter    COPD Daughter    Fibromyalgia Daughter    Ulcerative colitis Son    Stroke Maternal Grandmother    Cancer Brother    Lung cancer Brother    Hypertension Sister    Cancer Sister    Colon cancer Neg Hx    Food intolerance Neg Hx    Esophageal cancer Neg Hx    Rectal cancer Neg Hx    Stomach cancer Neg Hx    Colon polyps Neg Hx     New complaints: States nervousness. Has had klonopin but does not like how it makes her feel. Would like to try something new.    Social history: Has daughter as caretaker. Is active around her home.   Controlled substance contract: signed 05/22/2020    Review of Systems  Constitutional: Negative.   HENT: Negative.    Eyes: Negative.   Respiratory: Negative.    Cardiovascular: Negative.   Gastrointestinal: Negative.   Endocrine: Negative.   Genitourinary: Negative.   Musculoskeletal: Negative.   Skin: Negative.   Allergic/Immunologic: Negative.   Neurological: Negative.   Hematological: Negative.   Psychiatric/Behavioral:  The patient is nervous/anxious.   All other systems reviewed and are negative.     Objective:   Physical Exam Vitals and nursing note reviewed.  Constitutional:      Appearance: Normal appearance.  HENT:     Head: Normocephalic and atraumatic.     Right Ear: Tympanic membrane normal.     Left Ear: Tympanic membrane normal.     Nose: Nose normal.     Mouth/Throat:     Mouth: Mucous membranes are moist.     Pharynx: Oropharynx is clear.  Eyes:     Pupils: Pupils are equal, round, and reactive to light.  Cardiovascular:     Rate and Rhythm: Normal rate and regular rhythm.     Pulses: Normal pulses.     Heart sounds: Normal heart sounds.  Pulmonary:     Effort: Pulmonary effort is normal.     Breath sounds: Normal breath sounds.  Abdominal:     General: Bowel sounds are normal.  Musculoskeletal:        General: Normal range of motion.     Cervical back: Normal range of motion.      Right lower leg: Edema present.     Left lower leg: Edema present.  Skin:    General: Skin is warm and dry.     Capillary Refill: Capillary refill takes less than 2 seconds.  Neurological:     General: No focal deficit present.  Mental Status: She is alert and oriented to person, place, and time. Mental status is at baseline.  Psychiatric:        Mood and Affect: Mood normal.        Behavior: Behavior normal.        Thought Content: Thought content normal.        Judgment: Judgment normal.   BP 133/70   Pulse 67   Temp (!) 97.3 F (36.3 C) (Temporal)   Resp 20   Ht '5\' 3"'  (1.6 m)   Wt 182 lb (82.6 kg)   LMP 05/11/1992   SpO2 96%   BMI 32.24 kg/m       Assessment & Plan:   Veronica Paul comes in today with chief complaint of Medical Management of Chronic Issues   Diagnosis and orders addressed:  1. Primary hypertension Check blood pressure regularly. Eat a heart healthy diet and avoid foods high in salt. Report any chest pain or shortness of breath. Take medication as prescribed.   2. Irritable bowel syndrome with both constipation and diarrhea Avoid trigger foods. Take over the counter medication as needed for constipation or diarrhea. Eat a well balanced diet that is high in fiber.   3. Diabetes mellitus without complication (HCC) Check blood sugar regularly. Avoid foods that are high in carbs and sugar. Take medication as prescribed.   4. Mixed hyperlipidemia Take medication as prescribed. Avoid fried and fatty foods.   5. BMI 34.0-34.9,adult Exercise regularly. Walking is a great cardiovascular exercise that helps lower blood sugar, blood pressure, and cholesterol levels.   6. GAD (generalized anxiety disorder) Take medication as prescribed. Report any new or worsening symptoms. Begin taking Lexapro daily and we will follow up with is efficacy next visit.   7. Peripheral Edema  Increase lasix to (1) 78m tablet every day. Avoid high salt foods.   Labs  pending Health Maintenance reviewed Diet and exercise encouraged  Follow up plan: Follow up in 3 months.    Mary-Margaret MHassell Done FNP

## 2020-08-21 NOTE — Patient Instructions (Signed)
Peripheral Edema Peripheral edema is swelling that is caused by a buildup of fluid. Peripheral edema most often affects the lower legs, ankles, and feet. It can also develop in the arms, hands, and face. The area of the body that has peripheral edema will look swollen. It may also feel heavy or warm. Your clothes may start to feel tight. Pressing on the area may make a temporary dent in your skin. You may not be able to move your swollen arm or leg as much as usual. There are many causes of peripheral edema. It can happen because of a complication of other conditions such as congestive heart failure, kidney disease, or a problem with your blood circulation. It also can be a side effect of certain medicines or because of an infection. It often happens to women during pregnancy. Sometimes, the cause is not known. Follow these instructions at home: Managing pain, stiffness, and swelling  Raise (elevate) your legs while you are sitting or lying down. Move around often to prevent stiffness and to lessen swelling. Do not sit or stand for long periods of time. Wear support stockings as told by your health care provider. Medicines Take over-the-counter and prescription medicines only as told by your health care provider. Your health care provider may prescribe medicine to help your body get rid of excess water (diuretic). General instructions Pay attention to any changes in your symptoms. Follow instructions from your health care provider about limiting salt (sodium) in your diet. Sometimes, eating less salt may reduce swelling. Moisturize skin daily to help prevent skin from cracking and draining. Keep all follow-up visits as told by your health care provider. This is important. Contact a health care provider if you have: A fever. Edema that starts suddenly or is getting worse, especially if you are pregnant or have a medical condition. Swelling in only one leg. Increased swelling, redness, or pain in  one or both of your legs. Drainage or sores at the area where you have edema. Get help right away if you: Develop shortness of breath, especially when you are lying down. Have pain in your chest or abdomen. Feel weak. Feel faint. Summary Peripheral edema is swelling that is caused by a buildup of fluid. Peripheral edema most often affects the lower legs, ankles, and feet. Move around often to prevent stiffness and to lessen swelling. Do not sit or stand for long periods of time. Pay attention to any changes in your symptoms. Contact a health care provider if you have edema that starts suddenly or is getting worse, especially if you are pregnant or have a medical condition. Get help right away if you develop shortness of breath, especially when lying down. This information is not intended to replace advice given to you by your health care provider. Make sure you discuss any questions you have with your health care provider. Document Revised: 10/28/2017 Document Reviewed: 10/28/2017 Elsevier Patient Education  2022 [employer]  

## 2020-09-03 ENCOUNTER — Telehealth: Payer: Self-pay | Admitting: Nurse Practitioner

## 2020-09-03 NOTE — Telephone Encounter (Signed)
Please review and advise.

## 2020-09-03 NOTE — Telephone Encounter (Signed)
Lexapro usually does not cause that. Stop mediation and see if swallowinging improved. Ket me know please.

## 2020-09-03 NOTE — Telephone Encounter (Signed)
Daughter Neoma Laming called back. Says to disregard previous message  New message: pt is having trouble swallowing taking all of her pills--may be a side effect of the new medication--escitalopram (LEXAPRO) 10 MG Pt use to not have this problem. Also the cost of medication  Please call back

## 2020-09-03 NOTE — Telephone Encounter (Signed)
Pt not able to swallow escitalopram (LEXAPRO) 10 MG tablet because its a capsule. Daughter has not been to pt home to actually see the medication.   escitalopram (LEXAPRO) 10 MG tablet is $141 for 3 mo supply. Can something else different be called in? Use [facility]  Please call back

## 2020-09-03 NOTE — Telephone Encounter (Signed)
Neoma Laming notified and verbalized understanding. Will call us back in a few days and let us know how swallowing is

## 2020-09-07 ENCOUNTER — Telehealth: Payer: Self-pay | Admitting: Nurse Practitioner

## 2020-09-11 NOTE — Telephone Encounter (Signed)
Daughter wants to speak with Leafy Ro about pt's meds

## 2020-09-13 ENCOUNTER — Other Ambulatory Visit: Payer: Self-pay | Admitting: Nurse Practitioner

## 2020-09-13 MED ORDER — CITALOPRAM HYDROBROMIDE 20 MG PO TABS: 20.0000 mg | ORAL_TABLET | Freq: Every day | ORAL | 5 refills | Status: DC

## 2020-09-13 NOTE — Telephone Encounter (Signed)
Mandy pleases see what they need

## 2020-09-13 NOTE — Progress Notes (Signed)
Celexa 20mg  daily

## 2020-09-13 NOTE — Telephone Encounter (Signed)
Daughter calling to check up on this message. Aware MMM was off yesterday and wants a call back today if possible

## 2020-09-13 NOTE — Telephone Encounter (Signed)
Will try celexa 20mg  1 po daily- wil snd in prescription. May take a few weeks to start working once she starts taking.

## 2020-09-13 NOTE — Telephone Encounter (Signed)
Daughter Neoma Laming made aware.

## 2020-09-13 NOTE — Telephone Encounter (Signed)
Spoke with Neoma Laming and after stopping the lexapro the trouble swallowing and upset has stopped since discontinuing the lexapro. She is still needing a med to help with anxiety. Please advise

## 2020-09-17 ENCOUNTER — Ambulatory Visit (INDEPENDENT_AMBULATORY_CARE_PROVIDER_SITE_OTHER): Payer: Medicare HMO

## 2020-09-17 VITALS — Ht 63.0 in | Wt 182.0 lb

## 2020-09-17 DIAGNOSIS — Z0000 Encounter for general adult medical examination without abnormal findings: Secondary | ICD-10-CM | POA: Diagnosis not present

## 2020-09-17 NOTE — Progress Notes (Signed)
 Subjective:   Veronica Paul is a 85 y.o. female who presents for Medicare Annual (Subsequent) preventive examination.  Virtual Visit via Telephone Note  I connected with  Shakoya M Robling on 09/17/20 at  9:00 AM EDT by telephone and verified that I am speaking with the correct person using two identifiers.  Location: Patient: Home  Provider: [facility] Persons participating in the virtual visit: patient/Nurse Health Advisor   I discussed the limitations, risks, security and privacy concerns of performing an evaluation and management service by telephone and the availability of in person appointments. The patient expressed understanding and agreed to proceed.  Interactive audio and video telecommunications were attempted between this nurse and patient, however failed, due to patient having technical difficulties OR patient did not have access to video capability.  We continued and completed visit with audio only.  Some vital signs may be absent or patient reported.    E , LPN   Review of Systems     Cardiac Risk Factors include: advanced age (>55men, >65 women);diabetes mellitus;dyslipidemia;sedentary lifestyle;obesity (BMI >30kg/m2);hypertension     Objective:    Today's Vitals   09/17/20 0905  Weight: 182 lb (82.6 kg)  Height: 5' 3" (1.6 m)   Body mass index is 32.24 kg/m.  Advanced Directives 09/17/2020 09/15/2019 05/05/2019 11/08/2018 08/17/2018 08/17/2018 07/21/2018  Does Patient Have a Medical Advance Directive? Yes Yes No No No No No  Type of Advance Directive Healthcare Power of Attorney Healthcare Power of Attorney - - - - -  Does patient want to make changes to medical advance directive? - No - Patient declined - - - - -  Copy of Healthcare Power of Attorney in Chart? No - copy requested No - copy requested - - - - -  Would patient like information on creating a medical advance directive? - - No - Patient declined - No - Patient declined No - Guardian declined No -  Patient declined    Current Medications (verified) Outpatient Encounter Medications as of 09/17/2020  Medication Sig   acetaminophen (TYLENOL) 500 MG tablet Take 500 mg by mouth every 6 (six) hours as needed.   apixaban (ELIQUIS) 5 MG TABS tablet Take 1 tablet (5 mg total) by mouth 2 (two) times daily.   bisacodyl (DULCOLAX) 5 MG EC tablet Take 5 mg by mouth daily as needed for moderate constipation (usually twice per week).   Blood Glucose Monitoring Suppl (ONETOUCH VERIO) w/Device KIT Check blood sugars daily Dx E11.9   diltiazem (CARDIZEM CD) 360 MG 24 hr capsule Take 1 capsule (360 mg total) by mouth daily.   diltiazem (CARDIZEM) 30 MG tablet Take 1 tablet (30 mg total) by mouth 2 (two) times daily as needed.   furosemide (LASIX) 20 MG tablet Take 1 tablet (20 mg total) by mouth daily.   Lancet Devices (ONETOUCH DELICA PLUS LANCING) MISC 1 Device by Does not apply route daily.   levothyroxine (SYNTHROID) 75 MCG tablet Take 1 tablet (75 mcg total) by mouth daily.   metFORMIN (GLUCOPHAGE) 500 MG tablet Take 1 tablet (500 mg total) by mouth daily.   metoprolol (TOPROL-XL) 200 MG 24 hr tablet TAKE 1 AND 1/2 TABLET DAILY   OneTouch Delica Lancets 33G MISC Use to check blood sugar twice daily and as needed   ONETOUCH VERIO test strip CHECK BLOOD SUGARS DAILY DX E11.9   polyethylene glycol (MIRALAX / GLYCOLAX) 17 g packet Take 17 g by mouth daily.   triamcinolone cream (KENALOG) 0.1 % Apply   topically daily.   citalopram (CELEXA) 20 MG tablet Take 1 tablet (20 mg total) by mouth daily. (Patient not taking: Reported on 09/17/2020)   clonazePAM (KLONOPIN) 0.5 MG tablet Take 1 tablet (0.5 mg total) by mouth 2 (two) times daily as needed for anxiety. (Patient not taking: Reported on 09/17/2020)   escitalopram (LEXAPRO) 10 MG tablet Take 1 tablet (10 mg total) by mouth daily. (Patient not taking: Reported on 09/17/2020)   nitroGLYCERIN (NITROSTAT) 0.4 MG SL tablet PLACE 1 TABLET (0.4 MG TOTAL) UNDER THE  TONGUE EVERY 5 (FIVE) MINUTES AS NEEDED FOR CHEST PAIN. (Patient not taking: Reported on 09/17/2020)   No facility-administered encounter medications on file as of 09/17/2020.    Allergies (verified) Ace inhibitors, Feldene [piroxicam], Lexapro [escitalopram], Meloxicam, Prevnar [pneumococcal 13-val conj vacc], Statins, Sulfa antibiotics, and Zithromax [azithromycin dihydrate]   History: Past Medical History:  Diagnosis Date   Allergy    "my nose runs alot"   Anxiety    Arthritis    Atrial fibrillation ([place])    Blood transfusion without reported diagnosis    "when I had a miscarrage in 1957"   Cataract    bilateral removed   Cholelithiasis    Constipation    x ray showed increased stool in colon- uses Miralax daily and Dulcolax twice a week    Depression    Diabetes mellitus    Dyslipidemia    HTN (hypertension)    Hx of long-term (current) use of anticoagulants    Hyperlipidemia    Hypertension    Hypothyroidism    Lung cancer ([place])    Obesity    Osteopenia    Vitamin D deficiency    Past Surgical History:  Procedure Laterality Date   CATARACT EXTRACTION Bilateral    COLONOSCOPY     COLONOSCOPY WITH PROPOFOL N/A 05/06/2019   Procedure: COLONOSCOPY WITH PROPOFOL;  Surgeon: Jerene Bears, MD;  Location: [facility];  Service: Gastroenterology;  Laterality: N/A;   LUNG REMOVAL, PARTIAL  1998   Right   POLYPECTOMY  05/06/2019   Procedure: POLYPECTOMY;  Surgeon: Jerene Bears, MD;  Location: [facility];  Service: Gastroenterology;;   THYROIDECTOMY     TUBAL LIGATION     UPPER GASTROINTESTINAL ENDOSCOPY     Family History  Problem Relation Age of Onset   Coronary artery disease Brother 36   Hypertension Brother    Heart disease Brother    Hypertension Mother    Hypertension Father    Heart disease Father    Hypertension Sister    Heart disease Sister    Cancer Sister    Arthritis Daughter    COPD Daughter    Fibromyalgia Daughter    Ulcerative colitis Son     Stroke Maternal Grandmother    Cancer Brother    Lung cancer Brother    Hypertension Sister    Cancer Sister    Colon cancer Neg Hx    Food intolerance Neg Hx    Esophageal cancer Neg Hx    Rectal cancer Neg Hx    Stomach cancer Neg Hx    Colon polyps Neg Hx    Social History   Socioeconomic History   Marital status: Widowed    Spouse name: Not on file   Number of children: 4   Years of education: 45   Highest education level: 12th grade  Occupational History   Occupation: Retired    Comment: Conservation officer, historic buildings  Tobacco Use   Smoking status: Never  Smokeless tobacco: Never  Vaping Use   Vaping Use: Never used  Substance and Sexual Activity   Alcohol use: No   Drug use: No   Sexual activity: Not Currently  Other Topics Concern   Not on file  Social History Narrative   Lives alone - one level   Social Determinants of Health   Financial Resource Strain: Low Risk    Difficulty of Paying Living Expenses: Not very hard  Food Insecurity: No Food Insecurity   Worried About Charity fundraiser in the Last Year: Never true   Ran Out of Food in the Last Year: Never true  Transportation Needs: No Transportation Needs   Lack of Transportation (Medical): No   Lack of Transportation (Non-Medical): No  Physical Activity: Insufficiently Active   Days of Exercise per Week: 7 days   Minutes of Exercise per Session: 20 min  Stress: Stress Concern Present   Feeling of Stress : To some extent  Social Connections: Moderately Isolated   Frequency of Communication with Friends and Family: More than three times a week   Frequency of Social Gatherings with Friends and Family: More than three times a week   Attends Religious Services: 1 to 4 times per year   Active Member of [employer] or Organizations: No   Attends Archivist Meetings: Never   Marital Status: Widowed    Tobacco Counseling Counseling given: Not Answered   Clinical Intake:  Pre-visit preparation completed:  Yes  Pain : No/denies pain     BMI - recorded: 32.24 Nutritional Status: BMI > 30  Obese Nutritional Risks: None Diabetes: Yes CBG done?: No Did pt. bring in CBG monitor from home?: No  How often do you need to have someone help you when you read instructions, pamphlets, or other written materials from your doctor or pharmacy?: 1 - Never  Nutrition Risk Assessment:  Has the patient had any N/V/D within the last 2 months?  No  Does the patient have any non-healing wounds?  No  Has the patient had any unintentional weight loss or weight gain?  No   Diabetes:  Is the patient diabetic?  Yes  If diabetic, was a CBG obtained today?  No  Did the patient bring in their glucometer from home?  No  How often do you monitor your CBG's? Once daily fasting.   Financial Strains and Diabetes Management:  Are you having any financial strains with the device, your supplies or your medication? No .  Does the patient want to be seen by Chronic Care Management for management of their diabetes?  No  Would the patient like to be referred to a Nutritionist or for Diabetic Management?  No   Diabetic Exams:  Diabetic Eye Exam: Completed 09/2019.   Diabetic Foot Exam: Completed 08/12/2019. Pt has been advised about the importance in completing this exam. Pt is scheduled for diabetic foot exam on 11/21/2020.    Interpreter Needed?: No  Information entered by :: Tula Schryver, LPN   Activities of Daily Living In your present state of health, do you have any difficulty performing the following activities: 09/17/2020  Hearing? N  Vision? N  Difficulty concentrating or making decisions? N  Walking or climbing stairs? Y  Dressing or bathing? N  Doing errands, shopping? Y  Comment daughters drive her  Preparing Food and eating ? N  Using the Toilet? N  In the past six months, have you accidently leaked urine? Y  Comment only if not  quick enough at night  Do you have problems with loss of bowel  control? Y  Comment only when she drinks prune juice or takes dulcolax  Managing your Medications? N  Managing your Finances? N  Housekeeping or managing your Housekeeping? N  Some recent data might be hidden    Patient Care Team: Chevis Pretty, FNP as PCP - General (Family Medicine) Minus Breeding, MD as PCP - Cardiology (Cardiology) Minus Breeding, MD as Consulting Physician (Cardiology) Harlen Labs, MD as Referring Physician (Optometry) Franchot Gallo, MD as Consulting Physician (Urology) Sandford Craze, MD as Referring Physician (Dermatology) Loletha Carrow Kirke Corin, MD as Consulting Physician (Gastroenterology)  Indicate any recent Medical Services you may have received from other than [facility] providers in the past year (date may be approximate).     Assessment:   This is a routine wellness examination for Kimbely.  Hearing/Vision screen Hearing Screening - Comments:: Denies hearing difficulties Vision Screening - Comments:: Wears eyeglasses - up to date with annual eye exams with Dr Marin Comment in [place] issues and exercise activities discussed: Current Exercise Habits: Home exercise routine, Type of exercise: stretching;walking, Time (Minutes): 20, Frequency (Times/Week): 7, Weekly Exercise (Minutes/Week): 140, Intensity: Mild, Exercise limited by: orthopedic condition(s);cardiac condition(s)   Goals Addressed             This Visit's Progress    Exercise 3x per week (30 min per time)   On track    Increase exercise to 30 min 3 times weekly Treadmill Seated Strengthening Exercise (Handout)      Have 3 meals a day   On track    Increase lean protein (Lean beef, Chicken, and Fish) Increase fruits and vegetables with every Meal Increase Water intake         Depression Screen PHQ 2/9 Scores 09/17/2020 08/21/2020 05/21/2020 03/01/2020 11/23/2019 09/15/2019 08/12/2019  PHQ - 2 Score 0 0 0 0 0 0 0  PHQ- 9 Score 2 3 0 - 0 - 0  Exception Documentation - - - -  - - -    Fall Risk Fall Risk  09/17/2020 08/21/2020 05/21/2020 03/01/2020 11/23/2019  Falls in the past year? 0 0 0 0 0  Number falls in past yr: 0 - - - -  Injury with Fall? 0 - - - -  Risk for fall due to : Medication side effect;Impaired vision - - - -  Follow up Education provided;Falls prevention discussed - - - -    FALL RISK PREVENTION PERTAINING TO THE HOME:  Any stairs in or around the home? No  If so, are there any without handrails? No  Home free of loose throw rugs in walkways, pet beds, electrical cords, etc? Yes  Adequate lighting in your home to reduce risk of falls? Yes   ASSISTIVE DEVICES UTILIZED TO PREVENT FALLS:  Life alert? No  Use of a cane, walker or w/c? No  Grab bars in the bathroom? Yes  Shower chair or bench in shower? No  Elevated toilet seat or a handicapped toilet? Yes   TIMED UP AND GO:  Was the test performed? No . Telephonic visit  Cognitive Function: MMSE - Mini Mental State Exam 07/16/2017 07/15/2016  Orientation to time 5 5  Orientation to Place 5 5  Registration 3 3  Attention/ Calculation 5 5  Recall 2 2  Language- name 2 objects 2 2  Language- repeat 1 1  Language- follow 3 step command 3 3  Language- read & follow  direction 1 1  Write a sentence 1 1  Copy design 0 1  Total score 28 29     6CIT Screen 09/17/2020 09/15/2019 07/21/2018  What Year? 0 points 0 points 0 points  What month? 0 points 0 points 0 points  What time? 0 points 0 points 0 points  Count back from 20 0 points 4 points 0 points  Months in reverse 4 points 2 points 0 points  Repeat phrase 0 points 2 points 2 points  Total Score 4 8 2    Immunizations Immunization History  Administered Date(s) Administered   Fluad Quad(high Dose 65+) 12/01/2018, 11/23/2019   Influenza Split 01/17/2010   Influenza Whole 11/07/2011   Influenza, High Dose Seasonal PF 12/14/2015, 12/01/2016, 11/23/2017   Influenza,inj,Quad PF,6+ Mos 11/08/2012, 12/02/2013, 12/28/2014   Moderna  Sars-Covid-2 Vaccination 03/12/2019, 04/11/2019, 01/07/2020   Pneumococcal Conjugate-13 05/23/2013   Pneumococcal Polysaccharide-23 12/19/2007   Td 02/18/2003   Tdap 11/04/2010   Zoster, Live 08/13/2011    TDAP status: Up to date  Flu Vaccine status: Up to date  Pneumococcal vaccine status: Up to date  Covid-19 vaccine status: Completed vaccines  Qualifies for Shingles Vaccine? Yes   Zostavax completed Yes   Shingrix Completed?: No.    Education has been provided regarding the importance of this vaccine. Patient has been advised to call insurance company to determine out of pocket expense if they have not yet received this vaccine. Advised may also receive vaccine at local pharmacy or Health Dept. Verbalized acceptance and understanding.  Screening Tests Health Maintenance  Topic Date Due   Zoster Vaccines- Shingrix (1 of 2) Never done   COVID-19 Vaccine (4 - Booster for Moderna series) 05/06/2020   FOOT EXAM  08/11/2020   INFLUENZA VACCINE  09/17/2020   OPHTHALMOLOGY EXAM  11/21/2020 (Originally 10/16/2019)   TETANUS/TDAP  11/03/2020   HEMOGLOBIN A1C  02/15/2021   URINE MICROALBUMIN  03/01/2021   MAMMOGRAM  07/12/2021   DEXA SCAN  Completed   PNA vac Low Risk Adult  Completed   HPV VACCINES  Aged Out    Health Maintenance  Health Maintenance Due  Topic Date Due   Zoster Vaccines- Shingrix (1 of 2) Never done   COVID-19 Vaccine (4 - Booster for Moderna series) 05/06/2020   FOOT EXAM  08/11/2020   INFLUENZA VACCINE  09/17/2020    Colorectal cancer screening: No longer required.   Mammogram status: Completed 07/15/2019. Repeat every year  Bone Density status: Completed 07/17/2017. Results reflect: Bone density results: NORMAL. Repeat every 5 years.  Lung Cancer Screening: (Low Dose CT Chest recommended if Age 55-80 years, 30 pack-year currently smoking OR have quit w/in 15years.) does not qualify.   Additional Screening:  Hepatitis C Screening: does not  qualify  Vision Screening: Recommended annual ophthalmology exams for early detection of glaucoma and other disorders of the eye. Is the patient up to date with their annual eye exam?  Yes  Who is the provider or what is the name of the office in which the patient attends annual eye exams? [facility] If pt is not established with a provider, would they like to be referred to a provider to establish care? No .   Dental Screening: Recommended annual dental exams for proper oral hygiene  Community Resource Referral / Chronic Care Management: CRR required this visit?  No   CCM required this visit?  No      Plan:     I have personally reviewed   and noted the following in the patient's chart:   Medical and social history Use of alcohol, tobacco or illicit drugs  Current medications and supplements including opioid prescriptions.  Functional ability and status Nutritional status Physical activity Advanced directives List of other physicians Hospitalizations, surgeries, and ER visits in previous 12 months Vitals Screenings to include cognitive, depression, and falls Referrals and appointments  In addition, I have reviewed and discussed with patient certain preventive protocols, quality metrics, and best practice recommendations. A written personalized care plan for preventive services as well as general preventive health recommendations were provided to patient.      E , LPN   09/17/2020   Nurse Notes: None      

## 2020-09-17 NOTE — Patient Instructions (Signed)
Veronica Paul , Thank you for taking time to come for your Medicare Wellness Visit. I appreciate your ongoing commitment to your health goals. Please review the following plan we discussed and let me know if I can assist you in the future.   Screening recommendations/referrals: Colonoscopy: Done 05/06/2019 - repeat not required Mammogram: Done 07/15/2019 - recommend repeat every year Bone Density: Done 07/17/2017 - repeat in 5 years Recommended yearly ophthalmology/optometry visit for glaucoma screening and checkup Recommended yearly dental visit for hygiene and checkup  Vaccinations: Influenza vaccine: Done 11/23/2019 - Repeat annually  Pneumococcal vaccine: Done 12/19/2007 & 05/23/2013 Tdap vaccine: Done 11/04/2019 - Repeat in 10 years Shingles vaccine: Zostavax done 2013 - due for shingrix.   Covid-19:Done 03/12/2019, 04/11/2019, & 01/07/2020  Advanced directives: Please bring a copy of your health care power of attorney and living will to the office to be added to your chart at your convenience.   Conditions/risks identified: Aim for 30 minutes of exercise or brisk walking each day, drink 6-8 glasses of water and eat lots of fruits and vegetables.   Next appointment: Follow up in one year for your annual wellness visit    Preventive Care 65 Years and Older, Female Preventive care refers to lifestyle choices and visits with your health care provider that can promote health and wellness. What does preventive care include? A yearly physical exam. This is also called an annual well check. Dental exams once or twice a year. Routine eye exams. Ask your health care provider how often you should have your eyes checked. Personal lifestyle choices, including: Daily care of your teeth and gums. Regular physical activity. Eating a healthy diet. Avoiding tobacco and drug use. Limiting alcohol use. Practicing safe sex. Taking low-dose aspirin every day. Taking vitamin and mineral supplements as  recommended by your health care provider. What happens during an annual well check? The services and screenings done by your health care provider during your annual well check will depend on your age, overall health, lifestyle risk factors, and family history of disease. Counseling  Your health care provider may ask you questions about your: Alcohol use. Tobacco use. Drug use. Emotional well-being. Home and relationship well-being. Sexual activity. Eating habits. History of falls. Memory and ability to understand (cognition). Work and work Statistician. Reproductive health. Screening  You may have the following tests or measurements: Height, weight, and BMI. Blood pressure. Lipid and cholesterol levels. These may be checked every 5 years, or more frequently if you are over 39 years old. Skin check. Lung cancer screening. You may have this screening every year starting at age 21 if you have a 30-pack-year history of smoking and currently smoke or have quit within the past 15 years. Fecal occult blood test (FOBT) of the stool. You may have this test every year starting at age 39. Flexible sigmoidoscopy or colonoscopy. You may have a sigmoidoscopy every 5 years or a colonoscopy every 10 years starting at age 46. Hepatitis C blood test. Hepatitis B blood test. Sexually transmitted disease (STD) testing. Diabetes screening. This is done by checking your blood sugar (glucose) after you have not eaten for a while (fasting). You may have this done every 1-3 years. Bone density scan. This is done to screen for osteoporosis. You may have this done starting at age 50. Mammogram. This may be done every 1-2 years. Talk to your health care provider about how often you should have regular mammograms. Talk with your health care provider about your test results,  treatment options, and if necessary, the need for more tests. Vaccines  Your health care provider may recommend certain vaccines, such  as: Influenza vaccine. This is recommended every year. Tetanus, diphtheria, and acellular pertussis (Tdap, Td) vaccine. You may need a Td booster every 10 years. Zoster vaccine. You may need this after age 38. Pneumococcal 13-valent conjugate (PCV13) vaccine. One dose is recommended after age 47. Pneumococcal polysaccharide (PPSV23) vaccine. One dose is recommended after age 8. Talk to your health care provider about which screenings and vaccines you need and how often you need them. This information is not intended to replace advice given to you by your health care provider. Make sure you discuss any questions you have with your health care provider. Document Released: 03/02/2015 Document Revised: 10/24/2015 Document Reviewed: 12/05/2014 Elsevier Interactive Patient Education  2017 [employer] Prevention in the Home Falls can cause injuries. They can happen to people of all ages. There are many things you can do to make your home safe and to help prevent falls. What can I do on the outside of my home? Regularly fix the edges of walkways and driveways and fix any cracks. Remove anything that might make you trip as you walk through a door, such as a raised step or threshold. Trim any bushes or trees on the path to your home. Use bright outdoor lighting. Clear any walking paths of anything that might make someone trip, such as rocks or tools. Regularly check to see if handrails are loose or broken. Make sure that both sides of any steps have handrails. Any raised decks and porches should have guardrails on the edges. Have any leaves, snow, or ice cleared regularly. Use sand or salt on walking paths during winter. Clean up any spills in your garage right away. This includes oil or grease spills. What can I do in the bathroom? Use night lights. Install grab bars by the toilet and in the tub and shower. Do not use towel bars as grab bars. Use non-skid mats or decals in the tub or  shower. If you need to sit down in the shower, use a plastic, non-slip stool. Keep the floor dry. Clean up any water that spills on the floor as soon as it happens. Remove soap buildup in the tub or shower regularly. Attach bath mats securely with double-sided non-slip rug tape. Do not have throw rugs and other things on the floor that can make you trip. What can I do in the bedroom? Use night lights. Make sure that you have a light by your bed that is easy to reach. Do not use any sheets or blankets that are too big for your bed. They should not hang down onto the floor. Have a firm chair that has side arms. You can use this for support while you get dressed. Do not have throw rugs and other things on the floor that can make you trip. What can I do in the kitchen? Clean up any spills right away. Avoid walking on wet floors. Keep items that you use a lot in easy-to-reach places. If you need to reach something above you, use a strong step stool that has a grab bar. Keep electrical cords out of the way. Do not use floor polish or wax that makes floors slippery. If you must use wax, use non-skid floor wax. Do not have throw rugs and other things on the floor that can make you trip. What can I do with my stairs? Do  not leave any items on the stairs. Make sure that there are handrails on both sides of the stairs and use them. Fix handrails that are broken or loose. Make sure that handrails are as long as the stairways. Check any carpeting to make sure that it is firmly attached to the stairs. Fix any carpet that is loose or worn. Avoid having throw rugs at the top or bottom of the stairs. If you do have throw rugs, attach them to the floor with carpet tape. Make sure that you have a light switch at the top of the stairs and the bottom of the stairs. If you do not have them, ask someone to add them for you. What else can I do to help prevent falls? Wear shoes that: Do not have high heels. Have  rubber bottoms. Are comfortable and fit you well. Are closed at the toe. Do not wear sandals. If you use a stepladder: Make sure that it is fully opened. Do not climb a closed stepladder. Make sure that both sides of the stepladder are locked into place. Ask someone to hold it for you, if possible. Clearly mark and make sure that you can see: Any grab bars or handrails. First and last steps. Where the edge of each step is. Use tools that help you move around (mobility aids) if they are needed. These include: Canes. Walkers. Scooters. Crutches. Turn on the lights when you go into a dark area. Replace any light bulbs as soon as they burn out. Set up your furniture so you have a clear path. Avoid moving your furniture around. If any of your floors are uneven, fix them. If there are any pets around you, be aware of where they are. Review your medicines with your doctor. Some medicines can make you feel dizzy. This can increase your chance of falling. Ask your doctor what other things that you can do to help prevent falls. This information is not intended to replace advice given to you by your health care provider. Make sure you discuss any questions you have with your health care provider. Document Released: 11/30/2008 Document Revised: 07/12/2015 Document Reviewed: 03/10/2014 Elsevier Interactive Patient Education  2017 [employer].

## 2020-09-19 ENCOUNTER — Other Ambulatory Visit: Payer: Self-pay | Admitting: Nurse Practitioner

## 2020-10-03 ENCOUNTER — Other Ambulatory Visit: Payer: Self-pay | Admitting: Nurse Practitioner

## 2020-10-03 DIAGNOSIS — Z1231 Encounter for screening mammogram for malignant neoplasm of breast: Secondary | ICD-10-CM

## 2020-10-06 ENCOUNTER — Other Ambulatory Visit: Payer: Self-pay | Admitting: Nurse Practitioner

## 2020-10-25 ENCOUNTER — Encounter: Payer: Self-pay | Admitting: Family Medicine

## 2020-10-25 ENCOUNTER — Other Ambulatory Visit: Payer: Self-pay

## 2020-10-25 ENCOUNTER — Ambulatory Visit (INDEPENDENT_AMBULATORY_CARE_PROVIDER_SITE_OTHER): Payer: Medicare HMO | Admitting: Family Medicine

## 2020-10-25 VITALS — BP 138/77 | HR 78 | Temp 98.0°F | Ht 63.0 in | Wt 180.4 lb

## 2020-10-25 DIAGNOSIS — Z85828 Personal history of other malignant neoplasm of skin: Secondary | ICD-10-CM | POA: Diagnosis not present

## 2020-10-25 DIAGNOSIS — L570 Actinic keratosis: Secondary | ICD-10-CM | POA: Diagnosis not present

## 2020-10-25 DIAGNOSIS — L089 Local infection of the skin and subcutaneous tissue, unspecified: Secondary | ICD-10-CM

## 2020-10-25 DIAGNOSIS — L0100 Impetigo, unspecified: Secondary | ICD-10-CM | POA: Diagnosis not present

## 2020-10-25 DIAGNOSIS — Z1283 Encounter for screening for malignant neoplasm of skin: Secondary | ICD-10-CM | POA: Diagnosis not present

## 2020-10-25 MED ORDER — CEPHALEXIN 500 MG PO CAPS: 500.0000 mg | ORAL_CAPSULE | Freq: Two times a day (BID) | ORAL | 0 refills | Status: AC

## 2020-10-25 NOTE — Progress Notes (Signed)
Acute Office Visit  Subjective:    Patient ID: Veronica Paul, female    DOB: 09-18-1935, 85 y.o.   MRN: [id number]  Chief Complaint  Patient presents with   Toe Pain    HPI Patient is in today for pain in both big toes for 2 weeks since having a pedicure. She saw her dermatologist today who told her to follow up with her PCP as it looks a little infected. She denies fever or drainage. Reports the pain is like an ache that is worse with touching or weight bearing. She has not tried any remedies.   Past Medical History:  Diagnosis Date   Allergy    "my nose runs alot"   Anxiety    Arthritis    Atrial fibrillation ([place])    Blood transfusion without reported diagnosis    "when I had a miscarrage in 1957"   Cataract    bilateral removed   Cholelithiasis    Constipation    x ray showed increased stool in colon- uses Miralax daily and Dulcolax twice a week    Depression    Diabetes mellitus    Dyslipidemia    HTN (hypertension)    Hx of long-term (current) use of anticoagulants    Hyperlipidemia    Hypertension    Hypothyroidism    Lung cancer ([place])    Obesity    Osteopenia    Vitamin D deficiency     Past Surgical History:  Procedure Laterality Date   CATARACT EXTRACTION Bilateral    COLONOSCOPY     COLONOSCOPY WITH PROPOFOL N/A 05/06/2019   Procedure: COLONOSCOPY WITH PROPOFOL;  Surgeon: Jerene Bears, MD;  Location: [facility];  Service: Gastroenterology;  Laterality: N/A;   LUNG REMOVAL, PARTIAL  1998   Right   POLYPECTOMY  05/06/2019   Procedure: POLYPECTOMY;  Surgeon: Jerene Bears, MD;  Location: [facility];  Service: Gastroenterology;;   THYROIDECTOMY     TUBAL LIGATION     UPPER GASTROINTESTINAL ENDOSCOPY      Family History  Problem Relation Age of Onset   Coronary artery disease Brother 58   Hypertension Brother    Heart disease Brother    Hypertension Mother    Hypertension Father    Heart disease Father    Hypertension Sister    Heart  disease Sister    Cancer Sister    Arthritis Daughter    COPD Daughter    Fibromyalgia Daughter    Ulcerative colitis Son    Stroke Maternal Grandmother    Cancer Brother    Lung cancer Brother    Hypertension Sister    Cancer Sister    Colon cancer Neg Hx    Food intolerance Neg Hx    Esophageal cancer Neg Hx    Rectal cancer Neg Hx    Stomach cancer Neg Hx    Colon polyps Neg Hx     Social History   Socioeconomic History   Marital status: Widowed    Spouse name: Not on file   Number of children: 4   Years of education: 92   Highest education level: 12th grade  Occupational History   Occupation: Retired    Comment: Conservation officer, historic buildings  Tobacco Use   Smoking status: Never   Smokeless tobacco: Never  Vaping Use   Vaping Use: Never used  Substance and Sexual Activity   Alcohol use: No   Drug use: No   Sexual activity: Not Currently  Other Topics Concern  Not on file  Social History Narrative   Lives alone - one level   Social Determinants of Health   Financial Resource Strain: Low Risk    Difficulty of Paying Living Expenses: Not very hard  Food Insecurity: No Food Insecurity   Worried About Charity fundraiser in the Last Year: Never true   Ran Out of Food in the Last Year: Never true  Transportation Needs: No Transportation Needs   Lack of Transportation (Medical): No   Lack of Transportation (Non-Medical): No  Physical Activity: Insufficiently Active   Days of Exercise per Week: 7 days   Minutes of Exercise per Session: 20 min  Stress: Stress Concern Present   Feeling of Stress : To some extent  Social Connections: Moderately Isolated   Frequency of Communication with Friends and Family: More than three times a week   Frequency of Social Gatherings with Friends and Family: More than three times a week   Attends Religious Services: 1 to 4 times per year   Active Member of [employer] or Organizations: No   Attends Archivist Meetings: Never   Marital  Status: Widowed  Human resources officer Violence: Not At Risk   Fear of Current or Ex-Partner: No   Emotionally Abused: No   Physically Abused: No   Sexually Abused: No    Outpatient Medications Prior to Visit  Medication Sig Dispense Refill   acetaminophen (TYLENOL) 500 MG tablet Take 500 mg by mouth every 6 (six) hours as needed.     apixaban (ELIQUIS) 5 MG TABS tablet Take 1 tablet (5 mg total) by mouth 2 (two) times daily. 180 tablet 1   bisacodyl (DULCOLAX) 5 MG EC tablet Take 5 mg by mouth daily as needed for moderate constipation (usually twice per week).     Blood Glucose Monitoring Suppl (ONETOUCH VERIO) w/Device KIT Check blood sugars daily Dx E11.9 1 kit 0   citalopram (CELEXA) 20 MG tablet TAKE 1 TABLET BY MOUTH EVERY DAY 90 tablet 1   clonazePAM (KLONOPIN) 0.5 MG tablet Take 1 tablet (0.5 mg total) by mouth 2 (two) times daily as needed for anxiety. 60 tablet 2   diltiazem (CARDIZEM CD) 360 MG 24 hr capsule Take 1 capsule (360 mg total) by mouth daily. 90 capsule 3   diltiazem (CARDIZEM) 30 MG tablet Take 1 tablet (30 mg total) by mouth 2 (two) times daily as needed. 30 tablet 5   escitalopram (LEXAPRO) 10 MG tablet Take 1 tablet (10 mg total) by mouth daily. 90 tablet 1   furosemide (LASIX) 20 MG tablet Take 1 tablet (20 mg total) by mouth daily. 90 tablet 1   Lancet Devices (ONETOUCH DELICA PLUS LANCING) MISC 1 Device by Does not apply route daily. 1 each 0   levothyroxine (SYNTHROID) 75 MCG tablet Take 1 tablet (75 mcg total) by mouth daily. 90 tablet 1   metFORMIN (GLUCOPHAGE) 500 MG tablet Take 1 tablet (500 mg total) by mouth daily. 90 tablet 1   metoprolol (TOPROL-XL) 200 MG 24 hr tablet TAKE 1 AND 1/2 TABLET DAILY 135 tablet 1   nitroGLYCERIN (NITROSTAT) 0.4 MG SL tablet PLACE 1 TABLET (0.4 MG TOTAL) UNDER THE TONGUE EVERY 5 (FIVE) MINUTES AS NEEDED FOR CHEST PAIN. 25 tablet 1   OneTouch Delica Lancets 74Q MISC Use to check blood sugar twice daily and as needed 100 each 2    ONETOUCH VERIO test strip CHECK BLOOD SUGARS DAILY DX E11.9 100 strip 3   polyethylene glycol (MIRALAX /  GLYCOLAX) 17 g packet Take 17 g by mouth daily.     triamcinolone cream (KENALOG) 0.1 % Apply topically daily.     No facility-administered medications prior to visit.    Allergies  Allergen Reactions   Ace Inhibitors Cough   Feldene [Piroxicam]     REACTION: RASH TO HANDS   Lexapro [Escitalopram]    Meloxicam Nausea And Vomiting   Prevnar [Pneumococcal 13-Val Conj Vacc]    Statins Other (See Comments)    myalgia   Sulfa Antibiotics     Rash    Zithromax [Azithromycin Dihydrate] Itching    Review of Systems As per HPI.     Objective:    Physical Exam Vitals and nursing note reviewed.  Constitutional:      General: She is not in acute distress.    Appearance: She is not ill-appearing, toxic-appearing or diaphoretic.  Pulmonary:     Effort: Pulmonary effort is normal. No respiratory distress.  Musculoskeletal:     Right lower leg: No edema.     Left lower leg: No edema.  Feet:     Comments: Small closed abrasion to upper left great toe with tenderness and surrounding erythema. No swelling or drainage.  Skin:    General: Skin is warm and dry.  Neurological:     Mental Status: She is alert and oriented to person, place, and time. Mental status is at baseline.  Psychiatric:        Mood and Affect: Mood normal.        Behavior: Behavior normal.    BP 138/77   Pulse 78   Temp 98 F (36.7 C) (Temporal)   Ht '5\' 3"'  (1.6 m)   Wt 180 lb 6 oz (81.8 kg)   LMP 05/11/1992   BMI 31.95 kg/m  Wt Readings from Last 3 Encounters:  10/25/20 180 lb 6 oz (81.8 kg)  09/17/20 182 lb (82.6 kg)  08/21/20 182 lb (82.6 kg)    Health Maintenance Due  Topic Date Due   Zoster Vaccines- Shingrix (1 of 2) Never done   COVID-19 Vaccine (4 - Booster for Moderna series) 05/06/2020   FOOT EXAM  08/11/2020   INFLUENZA VACCINE  09/17/2020    There are no preventive care reminders to  display for this patient.   Lab Results  Component Value Date   TSH 1.160 05/18/2020   Lab Results  Component Value Date   WBC 7.9 08/16/2020   HGB 14.2 08/16/2020   HCT 43.2 08/16/2020   MCV 89 08/16/2020   PLT 267 08/16/2020   Lab Results  Component Value Date   NA 143 08/16/2020   K 4.5 08/16/2020   CO2 29 08/16/2020   GLUCOSE 109 (H) 08/16/2020   BUN 9 08/16/2020   CREATININE 0.97 08/16/2020   BILITOT 0.5 08/16/2020   ALKPHOS 71 08/16/2020   AST 19 08/16/2020   ALT 14 08/16/2020   PROT 6.7 08/16/2020   ALBUMIN 4.7 (H) 08/16/2020   CALCIUM 9.5 08/16/2020   ANIONGAP 10 05/06/2019   EGFR 58 (L) 08/16/2020   Lab Results  Component Value Date   CHOL 195 08/16/2020   Lab Results  Component Value Date   HDL 56 08/16/2020   Lab Results  Component Value Date   LDLCALC 121 (H) 08/16/2020   Lab Results  Component Value Date   TRIG 99 08/16/2020   Lab Results  Component Value Date   CHOLHDL 3.5 08/16/2020   Lab Results  Component Value Date  HGBA1C 5.6 08/16/2020       Assessment & Plan:   Nieshia was seen today for toe pain.  Diagnoses and all orders for this visit:  Infection of toe Keflex as below. Discussed epsom salt soaks. Return to office for new or worsening symptoms, or if symptoms persist.  -     cephALEXin (KEFLEX) 500 MG capsule; Take 1 capsule (500 mg total) by mouth 2 (two) times daily for 7 days.  The patient indicates understanding of these issues and agrees with the plan.  Gwenlyn Perking, FNP

## 2020-11-12 ENCOUNTER — Encounter: Payer: Self-pay | Admitting: Nurse Practitioner

## 2020-11-12 ENCOUNTER — Other Ambulatory Visit: Payer: Self-pay

## 2020-11-12 ENCOUNTER — Ambulatory Visit (INDEPENDENT_AMBULATORY_CARE_PROVIDER_SITE_OTHER): Payer: Medicare HMO | Admitting: Nurse Practitioner

## 2020-11-12 VITALS — BP 154/89 | HR 76 | Temp 98.9°F | Ht 63.0 in | Wt 179.4 lb

## 2020-11-12 DIAGNOSIS — Z23 Encounter for immunization: Secondary | ICD-10-CM

## 2020-11-12 DIAGNOSIS — L600 Ingrowing nail: Secondary | ICD-10-CM | POA: Diagnosis not present

## 2020-11-12 DIAGNOSIS — I10 Essential (primary) hypertension: Secondary | ICD-10-CM | POA: Diagnosis not present

## 2020-11-12 MED ORDER — NITROGLYCERIN 0.4 MG SL SUBL: 0.4000 mg | SUBLINGUAL_TABLET | SUBLINGUAL | 1 refills | Status: DC | PRN

## 2020-11-12 MED ORDER — DOXYCYCLINE HYCLATE 100 MG PO TABS: 100.0000 mg | ORAL_TABLET | Freq: Two times a day (BID) | ORAL | 0 refills | Status: DC

## 2020-11-12 NOTE — Patient Instructions (Signed)
Ingrown Toenail An ingrown toenail occurs when the corner or sides of a toenail grow into the surrounding skin. This causes discomfort and pain. The big toe is most commonly affected, but any of the toes can be affected. If an ingrown toenail is nottreated, it can become infected. What are the causes? This condition may be caused by: Wearing shoes that are too small or tight. An injury, such as stubbing your toe or having your toe stepped on. Improper cutting or care of your toenails. Having nail or foot abnormalities that were present from birth (congenital abnormalities), such as having a nail that is too big for your toe. What increases the risk? The following factors may make you more likely to develop ingrown toenails: Age. Nails tend to get thicker with age, so ingrown nails are more common among older people. Cutting your toenails incorrectly, such as cutting them very short or cutting them unevenly. An ingrown toenail is more likely to get infected if you have: Diabetes. Blood flow (circulation) problems. What are the signs or symptoms? Symptoms of an ingrown toenail may include: Pain, soreness, or tenderness. Redness. Swelling. Hardening of the skin that surrounds the toenail. Signs that an ingrown toenail may be infected include: Fluid or pus. Symptoms that get worse instead of better. How is this diagnosed? An ingrown toenail may be diagnosed based on your medical history, your symptoms, and a physical exam. If you have fluid or blood coming from your toenail, a sample may be collected to test for the specific type of bacteriathat is causing the infection. How is this treated? Treatment depends on how severe your ingrown toenail is. You may be able to care for your toenail at home. If you have an infection, you may be prescribed antibiotic medicines. If you have fluid or pus draining from your toenail, your health care provider may drain it. If you have trouble walking, you  may be given crutches to use. If you have a severe or infected ingrown toenail, you may need a procedure to remove part or all of the nail. Follow these instructions at home: Foot care  Do not pick at your toenail or try to remove it yourself. Soak your foot in warm, soapy water. Do this for 20 minutes, 3 times a day, or as often as told by your health care provider. This helps to keep your toe clean and keep your skin soft. Wear shoes that fit well and are not too tight. Your health care provider may recommend that you wear open-toed shoes while you heal. Trim your toenails regularly and carefully. Cut your toenails straight across to prevent injury to the skin at the corners of the toenail. Do not cut your nails in a curved shape. Keep your feet clean and dry to help prevent infection.  Medicines Take over-the-counter and prescription medicines only as told by your health care provider. If you were prescribed an antibiotic, take it as told by your health care provider. Do not stop taking the antibiotic even if you start to feel better. Activity Return to your normal activities as told by your health care provider. Ask your health care provider what activities are safe for you. Avoid activities that cause pain. General instructions If your health care provider told you to use crutches to help you move around, use them as instructed. Keep all follow-up visits as told by your health care provider. This is important. Contact a health care provider if: You have more redness, swelling, pain, or   other symptoms that do not improve with treatment. You have fluid, blood, or pus coming from your toenail. Get help right away if: You have a red streak on your skin that starts at your foot and spreads up your leg. You have a fever. Summary An ingrown toenail occurs when the corner or sides of a toenail grow into the surrounding skin. This causes discomfort and pain. The big toe is most commonly  affected, but any of the toes can be affected. If an ingrown toenail is not treated, it can become infected. Fluid or pus draining from your toenail is a sign of infection. Your health care provider may need to drain it. You may be given antibiotics to treat the infection. Trimming your toenails regularly and properly can help you prevent an ingrown toenail. This information is not intended to replace advice given to you by your health care provider. Make sure you discuss any questions you have with your healthcare provider. Document Revised: 05/28/2018 Document Reviewed: 10/22/2016 Elsevier Patient Education  2021 [employer]  

## 2020-11-12 NOTE — Progress Notes (Signed)
   Subjective:    Patient ID: Veronica Paul, female    DOB: 06-Jan-1936, 85 y.o.   MRN: [id number]  Chief Complaint: Nail Problem   HPI Patient Is brought in today by her daughter. She is c/o left great toe erythematous and sore to touch. She had pedicure several weeks ago and that is when it started. She was treated last week , but not healed.     Review of Systems  Constitutional:  Negative for diaphoresis.  Eyes:  Negative for pain.  Respiratory:  Negative for shortness of breath.   Cardiovascular:  Negative for chest pain, palpitations and leg swelling.  Gastrointestinal:  Negative for abdominal pain.  Endocrine: Negative for polydipsia.  Skin:  Negative for rash.  Neurological:  Negative for dizziness, weakness and headaches.  Hematological:  Does not bruise/bleed easily.  All other systems reviewed and are negative.     Objective:   Physical Exam Vitals and nursing note reviewed.  Constitutional:      Appearance: Normal appearance.  Cardiovascular:     Rate and Rhythm: Normal rate and regular rhythm.     Heart sounds: Normal heart sounds.  Pulmonary:     Effort: Pulmonary effort is normal.     Breath sounds: Normal breath sounds.  Skin:    General: Skin is warm and dry.     Comments: Erythematous lateral nail border that is sore to tough left great toe.  Neurological:     General: No focal deficit present.     Mental Status: She is alert and oriented to person, place, and time.    BP (!) 154/89   Pulse 76   Temp 98.9 F (37.2 C) (Temporal)   Ht 5\' 3"  (1.6 m)   Wt 179 lb 6.4 oz (81.4 kg)   LMP 05/11/1992   SpO2 97%   BMI 31.78 kg/m        Assessment & Plan:   Veronica Paul in today with chief complaint of Nail Problem   1. Ingrown left greater toenail Meds ordered this encounter  Medications   doxycycline (VIBRA-TABS) 100 MG tablet    Sig: Take 1 tablet (100 mg total) by mouth 2 (two) times daily. 1 po bid    Dispense:  20 tablet    Refill:  0     Order Specific Question:   Supervising Provider    Answer:   Caryl Pina A [[id number]]   Hold doxycycline and only take if worsens Soak in epsom salt BID Wedging toe nail discussed and demonstrated RTO prn     The above assessment and management plan was discussed with the patient. The patient verbalized understanding of and has agreed to the management plan. Patient is aware to call the clinic if symptoms persist or worsen. Patient is aware when to return to the clinic for a follow-up visit. Patient educated on when it is appropriate to go to the emergency department.   [name], FNP

## 2020-11-21 ENCOUNTER — Encounter: Payer: Self-pay | Admitting: Nurse Practitioner

## 2020-11-21 ENCOUNTER — Other Ambulatory Visit: Payer: Self-pay

## 2020-11-21 ENCOUNTER — Ambulatory Visit (INDEPENDENT_AMBULATORY_CARE_PROVIDER_SITE_OTHER): Payer: Medicare HMO | Admitting: Nurse Practitioner

## 2020-11-21 VITALS — BP 106/66 | HR 74 | Temp 96.8°F | Resp 20 | Ht 63.0 in | Wt 179.0 lb

## 2020-11-21 DIAGNOSIS — K582 Mixed irritable bowel syndrome: Secondary | ICD-10-CM | POA: Diagnosis not present

## 2020-11-21 DIAGNOSIS — R609 Edema, unspecified: Secondary | ICD-10-CM

## 2020-11-21 DIAGNOSIS — I4891 Unspecified atrial fibrillation: Secondary | ICD-10-CM

## 2020-11-21 DIAGNOSIS — F411 Generalized anxiety disorder: Secondary | ICD-10-CM

## 2020-11-21 DIAGNOSIS — E119 Type 2 diabetes mellitus without complications: Secondary | ICD-10-CM | POA: Diagnosis not present

## 2020-11-21 DIAGNOSIS — R69 Illness, unspecified: Secondary | ICD-10-CM | POA: Diagnosis not present

## 2020-11-21 DIAGNOSIS — E782 Mixed hyperlipidemia: Secondary | ICD-10-CM

## 2020-11-21 DIAGNOSIS — I10 Essential (primary) hypertension: Secondary | ICD-10-CM

## 2020-11-21 DIAGNOSIS — E876 Hypokalemia: Secondary | ICD-10-CM

## 2020-11-21 DIAGNOSIS — E039 Hypothyroidism, unspecified: Secondary | ICD-10-CM

## 2020-11-21 DIAGNOSIS — Z6834 Body mass index (BMI) 34.0-34.9, adult: Secondary | ICD-10-CM

## 2020-11-21 DIAGNOSIS — F3341 Major depressive disorder, recurrent, in partial remission: Secondary | ICD-10-CM

## 2020-11-21 LAB — BAYER DCA HB A1C WAIVED: HB A1C (BAYER DCA - WAIVED): 5.6 % (ref 4.8–5.6)

## 2020-11-21 MED ORDER — NITROGLYCERIN 0.4 MG SL SUBL: 0.4000 mg | SUBLINGUAL_TABLET | SUBLINGUAL | 1 refills | Status: AC | PRN

## 2020-11-21 MED ORDER — CLONAZEPAM 0.5 MG PO TABS: 0.5000 mg | ORAL_TABLET | Freq: Two times a day (BID) | ORAL | 2 refills | Status: DC | PRN

## 2020-11-21 MED ORDER — FUROSEMIDE 20 MG PO TABS: 20.0000 mg | ORAL_TABLET | Freq: Every day | ORAL | 1 refills | Status: DC

## 2020-11-21 NOTE — Progress Notes (Signed)
Subjective:    Patient ID: Veronica Paul, female    DOB: [birth year], 85 y.o.   MRN: [id number]   Chief Complaint: Medical Management of Chronic Issues    HPI:  1. Primary hypertension No c/o chest pain, sob or headache. Does not check blood pressure at home. BP Readings from Last 3 Encounters:  11/21/20 106/66  11/12/20 (!) 154/89  10/25/20 138/77     2. Diabetes mellitus without complication (HCC) Fasting blood sugars are aroun 100-120. No low blood sugars. Lab Results  Component Value Date   HGBA1C 5.6 08/16/2020     3. Mixed hyperlipidemia Does not watch diet and does no dedicated exercise. Lab Results  Component Value Date   CHOL 195 08/16/2020   HDL 56 08/16/2020   LDLCALC 121 (H) 08/16/2020   TRIG 99 08/16/2020   CHOLHDL 3.5 08/16/2020      4. Atrial fibrillation with RVR ([place]) Denies any palpitation or heart racing. Is on eliquis with no bleeding side effects.  5. Acquired hypothyroidism No problems that she is aware. Lab Results  Component Value Date   TSH 1.160 05/18/2020     6. Irritable bowel syndrome with both constipation and diarrhea No recent flare up  7. Hypokalemia No lower ext cramping Lab Results  Component Value Date   K 4.5 08/16/2020     8. Peripheral edema Denis any swelling  9. Recurrent major depressive disorder, in partial remission ([place]) Never started taking lexapro, was afraid of it when she readside effects. Depression screen [facility] 2/9 11/21/2020 11/12/2020 09/17/2020  Decreased Interest 0 0 0  Down, Depressed, Hopeless 0 0 0  PHQ - 2 Score 0 0 0  Altered sleeping 0 0 0  Tired, decreased energy 1 1 0  Change in appetite 0 0 0  Feeling bad or failure about yourself  0 0 0  Trouble concentrating 1 1 0  Moving slowly or fidgety/restless '2 2 2  ' Suicidal thoughts 0 0 0  PHQ-9 Score '4 4 2  ' Difficult doing work/chores Not difficult at all Not difficult at all Not difficult at all  Some recent data might be hidden      10. GAD (generalized anxiety disorder) Is on klonopin bid and is a constant worrier GAD 7 : Generalized Anxiety Score 11/21/2020 11/12/2020 08/21/2020 05/21/2020  Nervous, Anxious, on Edge '1 1 1 1  ' Control/stop worrying 0 0 1 1  Worry too much - different things '1 1 1 3  ' Trouble relaxing 0 0 0 0  Restless 0 0 0 0  Easily annoyed or irritable 0 0 0 0  Afraid - awful might happen 0 0 0 1  Total GAD 7 Score '2 2 3 6  ' Anxiety Difficulty Not difficult at all Not difficult at all Not difficult at all Not difficult at all      11. BMI 34.0-34.9,adult No recent weight changes Wt Readings from Last 3 Encounters:  11/21/20 179 lb (81.2 kg)  11/12/20 179 lb 6.4 oz (81.4 kg)  10/25/20 180 lb 6 oz (81.8 kg)    BMI Readings from Last 3 Encounters:  11/21/20 31.71 kg/m  11/12/20 31.78 kg/m  10/25/20 31.95 kg/m       Outpatient Encounter Medications as of 11/21/2020  Medication Sig   acetaminophen (TYLENOL) 500 MG tablet Take 500 mg by mouth every 6 (six) hours as needed.   apixaban (ELIQUIS) 5 MG TABS tablet Take 1 tablet (5 mg total) by mouth 2 (two) times daily.  bisacodyl (DULCOLAX) 5 MG EC tablet Take 5 mg by mouth daily as needed for moderate constipation (usually twice per week).   Blood Glucose Monitoring Suppl (ONETOUCH VERIO) w/Device KIT Check blood sugars daily Dx E11.9   citalopram (CELEXA) 20 MG tablet TAKE 1 TABLET BY MOUTH EVERY DAY   clonazePAM (KLONOPIN) 0.5 MG tablet Take 1 tablet (0.5 mg total) by mouth 2 (two) times daily as needed for anxiety.   diltiazem (CARDIZEM CD) 360 MG 24 hr capsule Take 1 capsule (360 mg total) by mouth daily.   diltiazem (CARDIZEM) 30 MG tablet Take 1 tablet (30 mg total) by mouth 2 (two) times daily as needed.   doxycycline (VIBRA-TABS) 100 MG tablet Take 1 tablet (100 mg total) by mouth 2 (two) times daily. 1 po bid   escitalopram (LEXAPRO) 10 MG tablet Take 1 tablet (10 mg total) by mouth daily.   furosemide (LASIX) 20 MG tablet Take  1 tablet (20 mg total) by mouth daily.   Lancet Devices (ONETOUCH DELICA PLUS LANCING) MISC 1 Device by Does not apply route daily.   levothyroxine (SYNTHROID) 75 MCG tablet Take 1 tablet (75 mcg total) by mouth daily.   metFORMIN (GLUCOPHAGE) 500 MG tablet Take 1 tablet (500 mg total) by mouth daily.   metoprolol (TOPROL-XL) 200 MG 24 hr tablet TAKE 1 AND 1/2 TABLET DAILY   nitroGLYCERIN (NITROSTAT) 0.4 MG SL tablet Place 1 tablet (0.4 mg total) under the tongue every 5 (five) minutes as needed for chest pain.   OneTouch Delica Lancets 21H MISC Use to check blood sugar twice daily and as needed   ONETOUCH VERIO test strip CHECK BLOOD SUGARS DAILY DX E11.9   polyethylene glycol (MIRALAX / GLYCOLAX) 17 g packet Take 17 g by mouth daily.   triamcinolone cream (KENALOG) 0.1 % Apply topically daily.   No facility-administered encounter medications on file as of 11/21/2020.    Past Surgical History:  Procedure Laterality Date   CATARACT EXTRACTION Bilateral    COLONOSCOPY     COLONOSCOPY WITH PROPOFOL N/A 05/06/2019   Procedure: COLONOSCOPY WITH PROPOFOL;  Surgeon: Jerene Bears, MD;  Location: [facility];  Service: Gastroenterology;  Laterality: N/A;   LUNG REMOVAL, PARTIAL  1998   Right   POLYPECTOMY  05/06/2019   Procedure: POLYPECTOMY;  Surgeon: Jerene Bears, MD;  Location: [facility];  Service: Gastroenterology;;   THYROIDECTOMY     TUBAL LIGATION     UPPER GASTROINTESTINAL ENDOSCOPY      Family History  Problem Relation Age of Onset   Coronary artery disease Brother 47   Hypertension Brother    Heart disease Brother    Hypertension Mother    Hypertension Father    Heart disease Father    Hypertension Sister    Heart disease Sister    Cancer Sister    Arthritis Daughter    COPD Daughter    Fibromyalgia Daughter    Ulcerative colitis Son    Stroke Maternal Grandmother    Cancer Brother    Lung cancer Brother    Hypertension Sister    Cancer Sister    Colon cancer Neg  Hx    Food intolerance Neg Hx    Esophageal cancer Neg Hx    Rectal cancer Neg Hx    Stomach cancer Neg Hx    Colon polyps Neg Hx     New complaints: None today  Social history: Lives by herself- daughters check on her daily   Controlled substance contract:  05/22/20     Review of Systems  Constitutional:  Negative for diaphoresis.  Eyes:  Negative for pain.  Respiratory:  Negative for shortness of breath.   Cardiovascular:  Negative for chest pain, palpitations and leg swelling.  Gastrointestinal:  Negative for abdominal pain.  Endocrine: Negative for polydipsia.  Skin:  Negative for rash.  Neurological:  Negative for dizziness, weakness and headaches.  Hematological:  Does not bruise/bleed easily.  All other systems reviewed and are negative.     Objective:   Physical Exam Vitals and nursing note reviewed.  Constitutional:      General: She is not in acute distress.    Appearance: Normal appearance. She is well-developed.  HENT:     Head: Normocephalic.     Right Ear: Tympanic membrane normal.     Left Ear: Tympanic membrane normal.     Nose: Nose normal.     Mouth/Throat:     Mouth: Mucous membranes are moist.  Eyes:     Pupils: Pupils are equal, round, and reactive to light.  Neck:     Vascular: No carotid bruit or JVD.  Cardiovascular:     Rate and Rhythm: Normal rate. Rhythm irregular.     Heart sounds: Normal heart sounds.  Pulmonary:     Effort: Pulmonary effort is normal. No respiratory distress.     Breath sounds: Normal breath sounds. No wheezing or rales.  Chest:     Chest wall: No tenderness.  Abdominal:     General: Bowel sounds are normal. There is no distension or abdominal bruit.     Palpations: Abdomen is soft. There is no hepatomegaly, splenomegaly, mass or pulsatile mass.     Tenderness: There is abdominal tenderness (mild diffuse).  Musculoskeletal:        General: Normal range of motion.     Cervical back: Normal range of motion and  neck supple.  Lymphadenopathy:     Cervical: No cervical adenopathy.  Skin:    General: Skin is warm and dry.  Neurological:     Mental Status: She is alert and oriented to person, place, and time.     Deep Tendon Reflexes: Reflexes are normal and symmetric.  Psychiatric:        Behavior: Behavior normal.        Thought Content: Thought content normal.        Judgment: Judgment normal.    BP 106/66   Pulse 74   Temp (!) 96.8 F (36 C) (Temporal)   Resp 20   Ht '5\' 3"'  (1.6 m)   Wt 179 lb (81.2 kg)   LMP 05/11/1992   SpO2 98%   BMI 31.71 kg/m   HGBA1c 5.8%      Assessment & Plan:  Veronica Paul comes in today with chief complaint of Medical Management of Chronic Issues   Diagnosis and orders addressed:  1. Primary hypertension Low sodium diet - CBC with Differential/Platelet - CMP14+EGFR  2. Diabetes mellitus without complication (HCC) Continue to watch carbsin diet - Bayer DCA Hb A1c Waived  3. Mixed hyperlipidemia Low fat diet - Lipid panel  4. Atrial fibrillation with RVR ([place]) Report any heart racing  5. Acquired hypothyroidism Labs pending  6. Irritable bowel syndrome with both constipation and diarrhea Watch diet die tto prevent flare p  7. Hypokalemia Labs pending  8. Peripheral edema Elevate legs when sitting - furosemide (LASIX) 20 MG tablet; Take 1 tablet (20 mg total) by mouth daily.  Dispense: 90  tablet; Refill: 1  9. Recurrent major depressive disorder, in partial remission ([place]) Encouraged totry lexapro  10. GAD (generalized anxiety disorder) Stress management - clonazePAM (KLONOPIN) 0.5 MG tablet; Take 1 tablet (0.5 mg total) by mouth 2 (two) times daily as needed for anxiety.  Dispense: 60 tablet; Refill: 2  11. BMI 34.0-34.9,adult Discussed diet and exercise for person with BMI >25 Will recheck weight in 3-6 months   - nitroGLYCERIN (NITROSTAT) 0.4 MG SL tablet; Place 1 tablet (0.4 mg total) under the tongue every 5 (five)  minutes as needed for chest pain.  Dispense: 25 tablet; Refill: 1   Labs pending Health Maintenance reviewed Diet and exercise encouraged  Follow up plan: 3 months   [name], FNP

## 2020-11-21 NOTE — Patient Instructions (Signed)

## 2020-11-22 LAB — CMP14+EGFR
ALT: 12 IU/L (ref 0–32)
AST: 19 IU/L (ref 0–40)
Albumin/Globulin Ratio: 2 (ref 1.2–2.2)
Albumin: 4.6 g/dL (ref 3.6–4.6)
Alkaline Phosphatase: 67 IU/L (ref 44–121)
BUN/Creatinine Ratio: 14 (ref 12–28)
BUN: 14 mg/dL (ref 8–27)
Bilirubin Total: 0.6 mg/dL (ref 0.0–1.2)
CO2: 24 mmol/L (ref 20–29)
Calcium: 9.3 mg/dL (ref 8.7–10.3)
Chloride: 100 mmol/L (ref 96–106)
Creatinine, Ser: 1 mg/dL (ref 0.57–1.00)
Globulin, Total: 2.3 g/dL (ref 1.5–4.5)
Glucose: 109 mg/dL — ABNORMAL HIGH (ref 70–99)
Potassium: 3.9 mmol/L (ref 3.5–5.2)
Sodium: 141 mmol/L (ref 134–144)
Total Protein: 6.9 g/dL (ref 6.0–8.5)
eGFR: 56 mL/min/{1.73_m2} — ABNORMAL LOW (ref >59)

## 2020-11-22 LAB — CBC WITH DIFFERENTIAL/PLATELET
Basophils Absolute: 0.1 10*3/uL (ref 0.0–0.2)
Basos: 1 %
EOS (ABSOLUTE): 0.3 10*3/uL (ref 0.0–0.4)
Eos: 3 %
Hematocrit: 44.3 % (ref 34.0–46.6)
Hemoglobin: 14.9 g/dL (ref 11.1–15.9)
Immature Grans (Abs): 0 10*3/uL (ref 0.0–0.1)
Immature Granulocytes: 0 %
Lymphocytes Absolute: 2.1 10*3/uL (ref 0.7–3.1)
Lymphs: 21 %
MCH: 29.5 pg (ref 26.6–33.0)
MCHC: 33.6 g/dL (ref 31.5–35.7)
MCV: 88 fL (ref 79–97)
Monocytes Absolute: 0.7 10*3/uL (ref 0.1–0.9)
Monocytes: 7 %
Neutrophils Absolute: 6.5 10*3/uL (ref 1.4–7.0)
Neutrophils: 68 %
Platelets: 259 10*3/uL (ref 150–450)
RBC: 5.05 x10E6/uL (ref 3.77–5.28)
RDW: 12.8 % (ref 11.7–15.4)
WBC: 9.7 10*3/uL (ref 3.4–10.8)

## 2020-11-22 LAB — LIPID PANEL
Chol/HDL Ratio: 3.5 ratio (ref 0.0–4.4)
Cholesterol, Total: 202 mg/dL — ABNORMAL HIGH (ref 100–199)
HDL: 57 mg/dL (ref >39)
LDL Chol Calc (NIH): 127 mg/dL — ABNORMAL HIGH (ref 0–99)
Triglycerides: 103 mg/dL (ref 0–149)
VLDL Cholesterol Cal: 18 mg/dL (ref 5–40)

## 2020-11-25 ENCOUNTER — Other Ambulatory Visit: Payer: Self-pay | Admitting: Nurse Practitioner

## 2020-11-26 ENCOUNTER — Telehealth: Payer: Self-pay | Admitting: Nurse Practitioner

## 2020-11-26 DIAGNOSIS — F411 Generalized anxiety disorder: Secondary | ICD-10-CM

## 2020-11-26 MED ORDER — CITALOPRAM HYDROBROMIDE 20 MG PO TABS: 20.0000 mg | ORAL_TABLET | Freq: Every day | ORAL | 1 refills | Status: DC

## 2020-11-26 NOTE — Telephone Encounter (Signed)
Spoke with daughter and she states that she recently went to the pharmacy to pick up meds for mom and was given lexapro. She thought that she was allergic to this and wanted to make sure. Advised daughter that she does have an allergy to this med and that citalopram was called in to replace it. Resent to pharmacy just to make sure they received it and also called pharmacy to let them know to remove lexapro from current meds.

## 2020-12-12 ENCOUNTER — Ambulatory Visit
Admission: RE | Admit: 2020-12-12 | Discharge: 2020-12-12 | Disposition: A | Discharge status: Home / Self Care | Payer: Medicare HMO | Source: Ambulatory Visit | Attending: Nurse Practitioner | Admitting: Nurse Practitioner

## 2020-12-12 ENCOUNTER — Other Ambulatory Visit: Payer: Self-pay

## 2020-12-12 DIAGNOSIS — Z1231 Encounter for screening mammogram for malignant neoplasm of breast: Secondary | ICD-10-CM

## 2020-12-13 ENCOUNTER — Telehealth: Payer: Self-pay | Admitting: Nurse Practitioner

## 2020-12-13 MED ORDER — SERTRALINE HCL 50 MG PO TABS: 50.0000 mg | ORAL_TABLET | Freq: Every day | ORAL | 5 refills | Status: DC

## 2020-12-13 NOTE — Telephone Encounter (Signed)
Patient aware and verbalized understanding. °

## 2020-12-13 NOTE — Telephone Encounter (Signed)
Wants a change in her anxiety meds states she just seen you on /10/05, Can not take the citalopram or lexapro. Please advise.

## 2020-12-13 NOTE — Telephone Encounter (Signed)
Please advise on what they should do?

## 2020-12-13 NOTE — Telephone Encounter (Signed)
Hold for 2 days prior then start back on it after procedure

## 2020-12-20 ENCOUNTER — Other Ambulatory Visit: Payer: Self-pay | Admitting: Nurse Practitioner

## 2020-12-20 DIAGNOSIS — R928 Other abnormal and inconclusive findings on diagnostic imaging of breast: Secondary | ICD-10-CM

## 2020-12-24 ENCOUNTER — Telehealth: Payer: Self-pay

## 2020-12-24 NOTE — Telephone Encounter (Signed)
Could be normal, but it can take 3-4 weeks to notice a difference with zoloft. As long as she is not suicidal I would continue taking.

## 2020-12-24 NOTE — Telephone Encounter (Signed)
Spoke to patient's daughter - patient was started on Zoloft and has taken for 8 days - patient has noticed that she is not wanting to get up and do things like she use to - decreased interest in normal activities. Daughter wants to know if this is a normal side effect to starting meds that will improve in time.

## 2020-12-25 NOTE — Telephone Encounter (Signed)
Called - she is aware to watch for signs of her getting worse and monitor for signs of SI. She will let us know of any changes.

## 2020-12-31 ENCOUNTER — Other Ambulatory Visit (HOSPITAL_COMMUNITY): Payer: Self-pay | Admitting: Nurse Practitioner

## 2020-12-31 DIAGNOSIS — R928 Other abnormal and inconclusive findings on diagnostic imaging of breast: Secondary | ICD-10-CM

## 2021-01-05 ENCOUNTER — Other Ambulatory Visit: Payer: Self-pay | Admitting: Nurse Practitioner

## 2021-01-08 ENCOUNTER — Inpatient Hospital Stay
Admission: RE | Admit: 2021-01-08 | Discharge: 2021-01-08 | Disposition: A | Discharge status: Home / Self Care | Payer: Self-pay | Source: Ambulatory Visit | Attending: Nurse Practitioner | Admitting: Nurse Practitioner

## 2021-01-08 ENCOUNTER — Other Ambulatory Visit (HOSPITAL_COMMUNITY): Payer: Self-pay | Admitting: Nurse Practitioner

## 2021-01-08 DIAGNOSIS — R928 Other abnormal and inconclusive findings on diagnostic imaging of breast: Secondary | ICD-10-CM

## 2021-01-09 ENCOUNTER — Telehealth: Payer: Self-pay | Admitting: Cardiology

## 2021-01-09 ENCOUNTER — Telehealth: Payer: Self-pay | Admitting: Nurse Practitioner

## 2021-01-09 NOTE — Telephone Encounter (Signed)
Patient c/o Palpitations:  High priority if patient c/o lightheadedness, shortness of breath, or chest pain  How long have you had palpitations/irregular HR/ Afib? Are you having the symptoms now? For the last two hours/ yes   Are you currently experiencing lightheadedness, SOB or CP? no  Do you have a history of afib (atrial fibrillation) or irregular heart rhythm? Yes   Have you checked your BP or HR? (document readings if available): no  Are you experiencing any other symptoms? Jitteriness, nervousness and racing heart

## 2021-01-09 NOTE — Telephone Encounter (Signed)
Spoke with pt, she reports feeling nervous and shaking since this morning. Her heart rate has been from 85 to 128 bpm currently. She reports she took a diltiazem 30 mg about 2 hours ago. She is not able to check her blood pressure as she is shaking so much. Her daughter is on the way to her home and ask the patient to have her call me when she gets there. Pt agreed with this plan.

## 2021-01-09 NOTE — Telephone Encounter (Signed)
Spoke with pt daughter, her vital signs are currently 134/76/98. She reports she is not shaking as bad as before. She took her first short acting cardizem about 3 hours ago and she was given the okay to take another now. She was has been on zoloft for about three week now and has noticed an increase in the a fib since starting the new med. She has a follow up appointment with dr hochrein next week and will write down and track the atrial fib for that appointment. She is aware she can call the office after today as someone will be on call to help them. ER precautions discussed and they voiced understanding.

## 2021-01-09 NOTE — Telephone Encounter (Signed)
Patient's daughter calling back.

## 2021-01-14 NOTE — Progress Notes (Signed)
  Cardiology Office Note   Date:  01/16/2021   ID:  [name], DOB 07/21/1935, MRN [id number]  PCP:  Martin, Mary-Margaret, FNP  Cardiologist:    , MD   Chief Complaint  Patient presents with   Palpitations       History of Present Illness: Veronica Paul is a 85 y.o. female who presents for follow up of atrial fib.   She called to be added to the schedule.  I read recently her blood pressures and heart rate diaries.  There is also a long report from her daughter about her recent symptoms.  She has had lots of tachypalpitations.  At the last visit I gave her Cardizem as needed and she has been taking it fairly frequently.  She feels her heart racing.  She does get anxious.  She feels a heart going fast.  She did not take the Cardizem for the last 2 or 3 nights.  She is being treated for anxiety and about 4 weeks ago was started on Zoloft.  She is not having any presyncope or syncope.  There is no new chest pressure, neck or arm discomfort.  She has some chronic abdominal pain  Past Medical History:  Diagnosis Date   Allergy    "my nose runs alot"   Anxiety    Arthritis    Atrial fibrillation (HCC)    Blood transfusion without reported diagnosis    "when I had a miscarrage in 1957"   Cataract    bilateral removed   Cholelithiasis    Constipation    x ray showed increased stool in colon- uses Miralax daily and Dulcolax twice a week    Depression    Diabetes mellitus    Dyslipidemia    HTN (hypertension)    Hx of long-term (current) use of anticoagulants    Hyperlipidemia    Hypertension    Hypothyroidism    Lung cancer (HCC)    Obesity    Osteopenia    Vitamin D deficiency     Past Surgical History:  Procedure Laterality Date   CATARACT EXTRACTION Bilateral    COLONOSCOPY     COLONOSCOPY WITH PROPOFOL N/A 05/06/2019   Procedure: COLONOSCOPY WITH PROPOFOL;  Surgeon: Pyrtle, Jay M, MD;  Location: [facility];  Service: Gastroenterology;   Laterality: N/A;   LUNG REMOVAL, PARTIAL  1998   Right   POLYPECTOMY  05/06/2019   Procedure: POLYPECTOMY;  Surgeon: Pyrtle, Jay M, MD;  Location: [facility];  Service: Gastroenterology;;   THYROIDECTOMY     TUBAL LIGATION     UPPER GASTROINTESTINAL ENDOSCOPY       Current Outpatient Medications  Medication Sig Dispense Refill   acetaminophen (TYLENOL) 500 MG tablet Take 500 mg by mouth every 6 (six) hours as needed.     apixaban (ELIQUIS) 5 MG TABS tablet Take 1 tablet (5 mg total) by mouth 2 (two) times daily. 180 tablet 1   bisacodyl (DULCOLAX) 5 MG EC tablet Take 5 mg by mouth daily as needed for moderate constipation (usually twice per week).     diltiazem (CARDIZEM CD) 360 MG 24 hr capsule Take 1 capsule (360 mg total) by mouth daily. 90 capsule 3   diltiazem (CARDIZEM) 30 MG tablet Take 1 tablet (30 mg total) by mouth daily. Take one tablet daily at 5 pm 90 tablet 3   furosemide (LASIX) 20 MG tablet Take 1 tablet (20 mg total) by mouth daily. 90 tablet 1   levothyroxine (  SYNTHROID) 75 MCG tablet Take 1 tablet (75 mcg total) by mouth daily. 90 tablet 1   metFORMIN (GLUCOPHAGE) 500 MG tablet Take 1 tablet (500 mg total) by mouth daily. 90 tablet 1   metoprolol (TOPROL-XL) 200 MG 24 hr tablet TAKE 1 AND 1/2 TABLET DAILY 135 tablet 1   nitroGLYCERIN (NITROSTAT) 0.4 MG SL tablet Place 1 tablet (0.4 mg total) under the tongue every 5 (five) minutes as needed for chest pain. 25 tablet 1   polyethylene glycol (MIRALAX / GLYCOLAX) 17 g packet Take 17 g by mouth daily.     sertraline (ZOLOFT) 50 MG tablet TAKE 1 TABLET BY MOUTH EVERY DAY 90 tablet 1   triamcinolone cream (KENALOG) 0.1 % Apply topically daily.     Blood Glucose Monitoring Suppl (ONETOUCH VERIO) w/Device KIT Check blood sugars daily Dx E11.9 1 kit 0   doxycycline (VIBRA-TABS) 100 MG tablet Take 1 tablet (100 mg total) by mouth 2 (two) times daily. 1 po bid 20 tablet 0   Lancet Devices (ONETOUCH DELICA PLUS LANCING) MISC 1  Device by Does not apply route daily. 1 each 0   Lancets (ONETOUCH DELICA PLUS BTYOMA00K) MISC USE TO CHECK BLOOD SUGARS DAILY DX E11.9 100 each 3   ONETOUCH VERIO test strip CHECK BLOOD SUGARS DAILY DX E11.9 100 strip 3   No current facility-administered medications for this visit.    Allergies:   Ace inhibitors, Feldene [piroxicam], Lexapro [escitalopram], Meloxicam, Prevnar [pneumococcal 13-val conj vacc], Statins, Sulfa antibiotics, and Zithromax [azithromycin dihydrate]    ROS:  Please see the history of present illness.   Otherwise, review of systems are positive for occasional diarrhea, some slurred speech.   All other systems are reviewed and negative.    PHYSICAL EXAM: VS:  BP 122/70   Pulse 100   Ht 5' 3" (1.6 m)   Wt 171 lb (77.6 kg)   LMP 05/11/1992   BMI 30.29 kg/m  , BMI Body mass index is 30.29 kg/m. GENERAL:  Well appearing NECK:  No jugular venous distention, waveform within normal limits, carotid upstroke brisk and symmetric, no bruits, no thyromegaly LUNGS:  Clear to auscultation bilaterally CHEST:  Unremarkable HEART:  PMI not displaced or sustained,S1 and S2 within normal limits, no S3, no clicks, no rubs, no murmurs, irregular  ABD:  Flat, positive bowel sounds normal in frequency in pitch, no bruits, no rebound, no guarding, no midline pulsatile mass, no hepatomegaly, no splenomegaly EXT:  2 plus pulses throughout, no edema, no cyanosis no clubbing   EKG:  EKG is  ordered today. Atrial fibrillation, rate 100, axis within normal limits, intervals within normal limits, no acute ST-T wave changes.  Recent Labs: 05/18/2020: TSH 1.160 11/21/2020: ALT 12; BUN 14; Creatinine, Ser 1.00; Hemoglobin 14.9; Platelets 259; Potassium 3.9; Sodium 141    Lipid Panel    Component Value Date/Time   CHOL 202 (H) 11/21/2020 1109   CHOL 100 05/07/2012 0957   TRIG 103 11/21/2020 1109   TRIG 148 06/21/2014 0932   TRIG 150 (H) 05/07/2012 0957   HDL 57 11/21/2020 1109   HDL  56 06/21/2014 0932   HDL 40 05/07/2012 0957   CHOLHDL 3.5 11/21/2020 1109   LDLCALC 127 (H) 11/21/2020 1109   LDLCALC 49 12/02/2013 0923   LDLCALC 30 05/07/2012 0957      Wt Readings from Last 3 Encounters:  01/16/21 171 lb (77.6 kg)  11/21/20 179 lb (81.2 kg)  11/12/20 179 lb 6.4 oz (81.4 kg)  Other studies Reviewed: Additional studies/ records that were reviewed today include:   Blood pressure diary sent in by her daughter along with a long list of recent symptoms Review of the above records demonstrates:  Please see elsewhere in the note.     ASSESSMENT AND PLAN:  Atrial fibrillation:    Ms. Lidwina M Backlund has a CHA2DS2 - VASc score of 5.   Today I am going to tell her to take her Cardizem 30 mg immediate release every evening around 5 PM or 4 PM in addition to her Cardizem 360.  I am also going to switch the capsule 362 tablets and she is having trouble swallowing this.  She will continue her anticoagulation.  She will let me know if this makes a difference.  If not we might need to go up on the dose or change to the addition of immediate low-dose beta-blocker in addition to her current regimen.   Coronary artery calcium:   She had a negative Lexiscan Myoview in Feb 2021.  No chest pain.  No change in therapy.  Hypertension:   The blood pressure is fluctuating but not particularly elevated but will tolerate the increase Cardizem.    Anxiety: This is a major consideration I believe and I think she is getting is managed and hopefully this will help.  Current medicines are reviewed at length with the patient today.  The patient does not have concerns regarding medicines.  The following changes have been made: As above  Labs/ tests ordered today include: None  Orders Placed This Encounter  Procedures   EKG 12-Lead      Disposition:   FU with me in 3 months.     Signed,  , MD  01/16/2021 10:13 AM    [facility] 

## 2021-01-15 ENCOUNTER — Encounter: Payer: Self-pay | Admitting: Cardiology

## 2021-01-16 ENCOUNTER — Encounter (HOSPITAL_COMMUNITY): Payer: Medicare HMO

## 2021-01-16 ENCOUNTER — Other Ambulatory Visit: Payer: Self-pay

## 2021-01-16 ENCOUNTER — Encounter: Payer: Self-pay | Admitting: Cardiology

## 2021-01-16 ENCOUNTER — Ambulatory Visit (HOSPITAL_COMMUNITY)
Admission: RE | Admit: 2021-01-16 | Discharge: 2021-01-16 | Disposition: A | Discharge status: Home / Self Care | Payer: Medicare HMO | Source: Ambulatory Visit | Attending: Nurse Practitioner | Admitting: Nurse Practitioner

## 2021-01-16 ENCOUNTER — Encounter (HOSPITAL_COMMUNITY): Payer: Self-pay

## 2021-01-16 ENCOUNTER — Ambulatory Visit (HOSPITAL_COMMUNITY): Payer: Medicare HMO

## 2021-01-16 ENCOUNTER — Ambulatory Visit (INDEPENDENT_AMBULATORY_CARE_PROVIDER_SITE_OTHER): Payer: Medicare HMO | Admitting: Cardiology

## 2021-01-16 VITALS — BP 122/70 | HR 100 | Ht 63.0 in | Wt 171.0 lb

## 2021-01-16 DIAGNOSIS — R928 Other abnormal and inconclusive findings on diagnostic imaging of breast: Secondary | ICD-10-CM | POA: Insufficient documentation

## 2021-01-16 DIAGNOSIS — R931 Abnormal findings on diagnostic imaging of heart and coronary circulation: Secondary | ICD-10-CM | POA: Diagnosis not present

## 2021-01-16 DIAGNOSIS — I10 Essential (primary) hypertension: Secondary | ICD-10-CM

## 2021-01-16 DIAGNOSIS — I482 Chronic atrial fibrillation, unspecified: Secondary | ICD-10-CM | POA: Diagnosis not present

## 2021-01-16 DIAGNOSIS — R922 Inconclusive mammogram: Secondary | ICD-10-CM | POA: Diagnosis not present

## 2021-01-16 MED ORDER — DILTIAZEM HCL ER COATED BEADS 360 MG PO TB24: 360.0000 mg | ORAL_TABLET | Freq: Every day | ORAL | 3 refills | Status: DC

## 2021-01-16 MED ORDER — DILTIAZEM HCL 30 MG PO TABS: 30.0000 mg | ORAL_TABLET | Freq: Every day | ORAL | 3 refills | Status: DC

## 2021-01-16 NOTE — Patient Instructions (Signed)
Medication Instructions:  Please take Diltiazem 30 mg daily at 5-6 pm. Continue all other medications as listed.  *If you need a refill on your cardiac medications before your next appointment, please call your pharmacy*  Follow-Up: At [facility], you and your health needs are our priority.  As part of our continuing mission to provide you with exceptional heart care, we have created designated Provider Care Teams.  These Care Teams include your primary Cardiologist (physician) and Advanced Practice Providers (APPs -  Physician Assistants and Nurse Practitioners) who all work together to provide you with the care you need, when you need it.  We recommend signing up for the patient portal called "MyChart".  Sign up information is provided on this After Visit Summary.  MyChart is used to connect with patients for Virtual Visits (Telemedicine).  Patients are able to view lab/test results, encounter notes, upcoming appointments, etc.  Non-urgent messages can be sent to your provider as well.   To learn more about what you can do with MyChart, go to [email or web address].    Your next appointment:   3 month(s)  The format for your next appointment:   In Person  Provider:   Minus Breeding, MD    Thank you for choosing [facility]!!

## 2021-01-17 ENCOUNTER — Encounter: Payer: Self-pay | Admitting: Cardiology

## 2021-01-24 ENCOUNTER — Ambulatory Visit: Payer: Medicare HMO | Admitting: Pharmacist

## 2021-02-01 MED ORDER — DILTIAZEM HCL ER COATED BEADS 180 MG PO TB24: 180.0000 mg | ORAL_TABLET | Freq: Two times a day (BID) | ORAL | 3 refills | Status: DC

## 2021-02-11 ENCOUNTER — Telehealth: Payer: Self-pay | Admitting: Nurse Practitioner

## 2021-02-11 NOTE — Telephone Encounter (Signed)
° °  Pts dtr called this AM to report that pt has been dx w/ the flu and has had a severe sore throat w/ throat swelling.  In that setting, she doesn't feel like she can swallow her pills.  I reviewed her history and med list.  She has permanent afib and is on toprol xl and dilt LA, along w/ eliquis. Pt thinks that she'd be able to swallow crushed meds in apple sauce.  She also has dilt 30mg  tabs @ home, which she usually takes @ 5pm.  I rec that for the time being, she use dilt 30mg  q 6h - crushed in applesauce or other medium as pt wishes.  Likewise, eliquis can be crushed.  She will hold off on taking toprol xl until her throat is feeling better and will contact us if HR/BP is poorly controlled.  At that point, we may need to either escalate dilt or call in metoprolol tartrate.  Caller verbalized understanding and was grateful for the call back.  Murray Hodgkins, NP 02/11/2021, 2:29 PM

## 2021-02-12 ENCOUNTER — Telehealth: Payer: Self-pay | Admitting: Nurse Practitioner

## 2021-02-12 ENCOUNTER — Encounter: Payer: Self-pay | Admitting: Cardiology

## 2021-02-12 NOTE — Telephone Encounter (Signed)
Patient's daughter called on 04/14/20 stating patient had fever of 101, sore throat and cough that started suddenly. On christmas day. Her throat is so sore that she has not been able to swallow her regular meds.she developed body aches at some point.  Home covid test was negative.  Tamiflu liquid was called in 75mg  bid for 5 days.   [name], FNP

## 2021-02-13 ENCOUNTER — Other Ambulatory Visit: Payer: Self-pay

## 2021-02-13 ENCOUNTER — Encounter: Payer: Self-pay | Admitting: Nurse Practitioner

## 2021-02-13 ENCOUNTER — Ambulatory Visit (HOSPITAL_COMMUNITY)
Admission: RE | Admit: 2021-02-13 | Discharge: 2021-02-13 | Disposition: A | Discharge status: Home / Self Care | Payer: Medicare HMO | Source: Ambulatory Visit | Attending: Nurse Practitioner | Admitting: Nurse Practitioner

## 2021-02-13 ENCOUNTER — Ambulatory Visit (INDEPENDENT_AMBULATORY_CARE_PROVIDER_SITE_OTHER): Payer: Medicare HMO | Admitting: Nurse Practitioner

## 2021-02-13 VITALS — BP 145/98 | HR 137 | Temp 97.6°F | Resp 20 | Ht 63.0 in | Wt 169.0 lb

## 2021-02-13 DIAGNOSIS — M852 Hyperostosis of skull: Secondary | ICD-10-CM | POA: Diagnosis not present

## 2021-02-13 DIAGNOSIS — I6523 Occlusion and stenosis of bilateral carotid arteries: Secondary | ICD-10-CM | POA: Diagnosis not present

## 2021-02-13 DIAGNOSIS — R1319 Other dysphagia: Secondary | ICD-10-CM | POA: Diagnosis not present

## 2021-02-13 DIAGNOSIS — R4781 Slurred speech: Secondary | ICD-10-CM

## 2021-02-13 DIAGNOSIS — R29818 Other symptoms and signs involving the nervous system: Secondary | ICD-10-CM | POA: Diagnosis not present

## 2021-02-13 DIAGNOSIS — I672 Cerebral atherosclerosis: Secondary | ICD-10-CM | POA: Diagnosis not present

## 2021-02-13 LAB — POCT I-STAT CREATININE: Creatinine, Ser: 0.9 mg/dL (ref 0.44–1.00)

## 2021-02-13 MED ORDER — IOHEXOL 350 MG/ML SOLN
100.0000 mL | Freq: Once | INTRAVENOUS | Status: AC | PRN
  Administered 2021-02-13: 14:00:00 75 mL via INTRAVENOUS

## 2021-02-13 NOTE — Patient Instructions (Signed)
Ischemic Stroke An ischemic stroke happens when part of the brain does not get enough blood. This can cause a lifelong change in how the brain works. An ischemic stroke is an emergency. It must be treated right away. What are the causes? This condition is caused by lower blood flow to part of the brain. This may be due to: A small clump, or clot, of blood. A buildup of fatty substance (plaque) in the blood vessels. An abnormal heart rhythm. A blocked or damaged artery in the head or neck. Infection. Swelling of the arteries in the brain. Sometimes, the cause is not known. What increases the risk? Certain medical conditions, such as: High blood pressure (hypertension). Heart disease. Diabetes. High cholesterol. Being very overweight. Sleep problems (sleep apnea). Other risk factors that you can change include: Smoking. Not being active. Heavy alcohol and drug use. Taking birth control pills, especially if you smoke. Risk factors that you cannot change include: Being older than age 71. Having had blood clots, stroke, or mini-stroke (transient ischemic attack, TIA) before. Having a family history of stroke. What are the signs or symptoms? Symptoms of a stroke usually happen all of a sudden. They can include: Weakness or a loss of feeling in your face, arm, or leg, often on one side of the body. Loss of balance or coordination. Slurred speech. Trouble talking or understanding what people say. Seeing things blurry or seeing double of one thing. Feeling dizzy or confused. Feeling like you may vomit (nausea) or vomiting. A very bad headache. If you can, write down the exact time you first had symptoms. Tell your doctor. If symptoms come and go, they could be caused by a mini-stroke. Get help right away, even if you feel better. How is this treated? You must get treatment as soon as you have stroke symptoms. Some treatments work better if they are done within 3-6 hours of your first  symptoms. You may get medicines that do these things: Take out or break up the blood clot. Control blood pressure. Thin your blood. Other treatments may include: Getting oxygen. Getting fluids through an IV tube. Procedures that make blood flow better. After a stroke, you may work with therapists to help you get better. Follow these instructions at home: Medicines Take over-the-counter and prescription medicines only as told by your doctor. If you were told to take a medicine to thin your blood, take it exactly as told. Take it at the same time each day. Taking too much of this medicine can cause bleeding. If you do not take enough medicine, it may not work as well. If your doctor did not prescribe medicines that have aspirin or NSAIDs, such as ibuprofen, talk to your doctor before you take any of these. If you are taking a blood thinner: Put pressure over any cuts that bleed for longer than normal. Tell your dentist and other doctors that you take this medicine. Avoid activities that could hurt or bruise you. Wear a medical alert bracelet or carry a card that shows you are taking blood thinners. Eating and drinking Follow instructions from your doctor about diet. Eat healthy foods. If you have trouble swallowing: Take small bites when eating. Eat foods that are soft or pureed. Safety Follow instructions from your care team about physical activity. Use a walker or cane as told by your doctor. Keep your home safe so you do not fall. Take these steps: Put grab bars in the bedroom and bathroom. Use raised toilets and put  a seat in the shower. Get rid of clutter and things you could trip on, such as cords or rugs. General instructions Do not smoke or use any products that contain nicotine or tobacco. If you need help quitting, ask your doctor. If you drink alcohol: Limit how much you have to: 0-1 drink a day for women who are not pregnant. 0-2 drinks a day for men. Know how much  alcohol is in your drink. In the [place] one drink equals one 12 oz bottle of beer (355 mL), one 5 oz glass of wine (148 mL), or one 1 oz glass of hard liquor (44 mL). Stay active. Keep all follow-up visits. How is this prevented? You can lower your risk of another stroke by managing these conditions: High blood pressure. High cholesterol. Diabetes. Heart disease. Sleep problems. Being overweight. You can also lower your risk if you: Quit smoking. Limit alcohol. Stay active. Your doctor will continue to help you with ways to prevent problems caused by a stroke. Get help right away if: You have any signs of a stroke. "BE FAST" is an easy way to remember the main warning signs: B - Balance. Dizziness, sudden trouble walking, or loss of balance. E - Eyes. Trouble seeing or a change in how you see. F - Face. Sudden weakness or loss of feeling of the face. The face or eyelid may droop on one side. A - Arms. Weakness or loss of feeling in an arm. This happens all of a sudden and most often on one side of the body. S - Speech. Sudden trouble speaking, slurred speech, or trouble understanding what people say. T - Time. Time to call emergency services. Write down what time symptoms started. You have other signs of a stroke, such as: A sudden, very bad headache with no known cause. Feeling like you may vomit. Vomiting. A seizure. These symptoms may be an emergency. Get help right away. Call your local emergency services (911 in the [place] Do not wait to see if the symptoms will go away. Do not drive yourself to the hospital. Summary An ischemic stroke happens when the brain does not get enough blood. Symptoms of a stroke often happen all of a sudden. You must get treatment as soon as you have stroke symptoms. Stroke is an emergency. It must be treated right away. This information is not intended to replace advice given to you by your health care provider. Make sure you discuss any questions  you have with your health care provider. Document Revised: 09/14/2019 Document Reviewed: 09/14/2019 Elsevier Patient Education  [place].

## 2021-02-13 NOTE — Addendum Note (Signed)
Addended by: Chevis Pretty on: 02/13/2021 12:00 PM   Modules accepted: Orders

## 2021-02-13 NOTE — Telephone Encounter (Signed)
She should be fine with the 30 mg tablets until she can swallow again.  In addition to her BP, just watch her HR and if it's staying < 100 then she'll be fine.  Just go back to the long acting form when she can swallow again.

## 2021-02-13 NOTE — Addendum Note (Signed)
Addended by: Chevis Pretty on: 02/13/2021 12:26 PM   Modules accepted: Orders

## 2021-02-13 NOTE — Progress Notes (Signed)
° °  Subjective:    Patient ID: Veronica Paul, female    DOB: [birth year], 85 y.o.   MRN: [id number]   Chief Complaint: Dysphagia   HPI Patient comes in c/o trouble swallowing. She was treated for flu over christmas. She had sore throat then and was hiving trouble swallowing. Could not swallow any of her meds. She is still having trouble swallowing. She can swallow liquids. Having trouble swallowing her meds and food.  Concerned because she cannot take her time released diltiazem because they cannot crush it like they do her other meds. Dr, hochrien is aware of situation.   Review of Systems  Constitutional:  Negative for diaphoresis.  Eyes:  Negative for pain.  Respiratory:  Negative for shortness of breath.   Cardiovascular:  Negative for chest pain, palpitations and leg swelling.  Gastrointestinal:  Negative for abdominal pain.  Endocrine: Negative for polydipsia.  Skin:  Negative for rash.  Neurological:  Negative for dizziness, weakness and headaches.  Hematological:  Does not bruise/bleed easily.  All other systems reviewed and are negative.     Objective:   Physical Exam Vitals reviewed.  Constitutional:      Appearance: Normal appearance.  HENT:     Nose: Nose normal.     Mouth/Throat:     Mouth: Mucous membranes are moist.  Eyes:     Comments: Left pupil larger then right  Right pupil slower to react Right lid drooping Smile equal   Cardiovascular:     Rate and Rhythm: Tachycardia present. Rhythm irregular.     Pulses: Normal pulses.     Heart sounds: Normal heart sounds.  Pulmonary:     Breath sounds: Normal breath sounds.  Musculoskeletal:     Comments: Gait slow and steady  Neurological:     General: No focal deficit present.     Mental Status: She is alert and oriented to person, place, and time.     Cranial Nerves: No cranial nerve deficit.     Sensory: No sensory deficit.     Motor: No weakness.     Gait: Gait normal.     Deep Tendon Reflexes:  Reflexes normal.  Psychiatric:        Mood and Affect: Mood normal.        Behavior: Behavior normal.     Comments: speech is slurred and delayed Trouble answering questions- hard  to get thoughts out    BP (!) 145/98    Pulse (!) 137    Temp 97.6 F (36.4 C) (Temporal)    Resp 20    Ht 5\' 3"  (1.6 m)    Wt 169 lb (76.7 kg)    LMP 05/11/1992    SpO2 96%    BMI 29.94 kg/m        Assessment & Plan:  Veronica Paul in today with chief complaint of Dysphagia   1. Esophageal dysphagia Force fluids Soft foods - Ambulatory referral to Gastroenterology  2. Slurred speech Questionable stroke - CT HEAD W CONTRAST (5MM); Future    The above assessment and management plan was discussed with the patient. The patient verbalized understanding of and has agreed to the management plan. Patient is aware to call the clinic if symptoms persist or worsen. Patient is aware when to return to the clinic for a follow-up visit. Patient educated on when it is appropriate to go to the emergency department.   [name], FNP

## 2021-02-14 ENCOUNTER — Ambulatory Visit: Payer: Medicare HMO | Admitting: Nurse Practitioner

## 2021-02-14 ENCOUNTER — Telehealth: Payer: Self-pay | Admitting: Gastroenterology

## 2021-02-14 NOTE — Telephone Encounter (Signed)
Patients daughter called and stated that PCP had sent over a referral for her mom who has not been able to swallow any foods since christmas morning. Patient was seen yesterday by PCP. PCP stated that her throat looked fine and was unsure why she is unable to swallow. PCP also believes patient may have had a stroke. Daughter has been crushing patients pills and trying to get them in her system, Patient is also in a-fib. I gave daughter next date with [facility] and she stated she could not wait that long and also with a PA and she said the same. I advised ER, patients daughter is seeking advice and wants to speak to nurse. Please advise.

## 2021-02-14 NOTE — Telephone Encounter (Signed)
Called and spoke with Neoma Laming. She states that after she called here she received a call from patient's PCP letting her know that we have the patient scheduled to see Dr. Loletha Carrow on Thursday, 02/21/21 at 1:20 pm. Neoma Laming is aware that the appt on 1/5 is an office visit and after evaluation if Dr. Loletha Carrow thinks that patient needs an EGD it will be scheduled. Neoma Laming also reports that the patient has been sick with the flu and that may have caused her throat to swell. She states that pt has been tolerating applesauce and water. I advised Neoma Laming that if the patient is not able to eat or drink any liquids then she would need to go to the ED for evaluation. Neoma Laming states that she would let us know if pt goes to the ED so that we cancel her appt. Neoma Laming verbalized understanding and had no concerns at the end of the call.

## 2021-02-16 ENCOUNTER — Other Ambulatory Visit: Payer: Self-pay | Admitting: Nurse Practitioner

## 2021-02-16 MED ORDER — HYDROCODONE BIT-HOMATROP MBR 5-1.5 MG/5ML PO SOLN: 5.0000 mL | Freq: Four times a day (QID) | ORAL | 0 refills | Status: DC | PRN

## 2021-02-18 ENCOUNTER — Encounter: Payer: Self-pay | Admitting: Cardiology

## 2021-02-20 ENCOUNTER — Other Ambulatory Visit: Payer: Self-pay | Admitting: Cardiology

## 2021-02-20 MED ORDER — DILTIAZEM HCL 30 MG PO TABS: 30.0000 mg | ORAL_TABLET | Freq: Four times a day (QID) | ORAL | 3 refills | Status: DC

## 2021-02-21 ENCOUNTER — Inpatient Hospital Stay (HOSPITAL_COMMUNITY)
Admission: EM | Admit: 2021-02-21 | Discharge: 2021-02-27 | DRG: 308 | Disposition: A | Discharge status: Home Health | Payer: Medicare HMO | Attending: Student in an Organized Health Care Education/Training Program | Admitting: Student in an Organized Health Care Education/Training Program

## 2021-02-21 ENCOUNTER — Other Ambulatory Visit: Payer: Self-pay

## 2021-02-21 ENCOUNTER — Encounter (HOSPITAL_COMMUNITY): Payer: Self-pay | Admitting: Emergency Medicine

## 2021-02-21 ENCOUNTER — Emergency Department (HOSPITAL_COMMUNITY): Payer: Medicare HMO

## 2021-02-21 ENCOUNTER — Encounter: Payer: Self-pay | Admitting: Gastroenterology

## 2021-02-21 ENCOUNTER — Ambulatory Visit: Payer: Medicare HMO | Admitting: Gastroenterology

## 2021-02-21 VITALS — BP 120/70 | HR 115 | Ht 63.0 in | Wt 166.0 lb

## 2021-02-21 DIAGNOSIS — E860 Dehydration: Secondary | ICD-10-CM | POA: Diagnosis not present

## 2021-02-21 DIAGNOSIS — J988 Other specified respiratory disorders: Secondary | ICD-10-CM | POA: Diagnosis present

## 2021-02-21 DIAGNOSIS — F05 Delirium due to known physiological condition: Secondary | ICD-10-CM | POA: Diagnosis not present

## 2021-02-21 DIAGNOSIS — F32A Depression, unspecified: Secondary | ICD-10-CM | POA: Diagnosis present

## 2021-02-21 DIAGNOSIS — R059 Cough, unspecified: Secondary | ICD-10-CM | POA: Diagnosis not present

## 2021-02-21 DIAGNOSIS — R1310 Dysphagia, unspecified: Secondary | ICD-10-CM

## 2021-02-21 DIAGNOSIS — I480 Paroxysmal atrial fibrillation: Principal | ICD-10-CM | POA: Diagnosis present

## 2021-02-21 DIAGNOSIS — Z888 Allergy status to other drugs, medicaments and biological substances status: Secondary | ICD-10-CM

## 2021-02-21 DIAGNOSIS — Z881 Allergy status to other antibiotic agents status: Secondary | ICD-10-CM

## 2021-02-21 DIAGNOSIS — G8194 Hemiplegia, unspecified affecting left nondominant side: Secondary | ICD-10-CM | POA: Diagnosis not present

## 2021-02-21 DIAGNOSIS — R1314 Dysphagia, pharyngoesophageal phase: Secondary | ICD-10-CM | POA: Diagnosis present

## 2021-02-21 DIAGNOSIS — E876 Hypokalemia: Secondary | ICD-10-CM | POA: Diagnosis present

## 2021-02-21 DIAGNOSIS — U071 COVID-19: Secondary | ICD-10-CM | POA: Diagnosis not present

## 2021-02-21 DIAGNOSIS — R0602 Shortness of breath: Secondary | ICD-10-CM | POA: Diagnosis not present

## 2021-02-21 DIAGNOSIS — R000 Tachycardia, unspecified: Secondary | ICD-10-CM

## 2021-02-21 DIAGNOSIS — I4891 Unspecified atrial fibrillation: Secondary | ICD-10-CM | POA: Diagnosis present

## 2021-02-21 DIAGNOSIS — Z902 Acquired absence of lung [part of]: Secondary | ICD-10-CM

## 2021-02-21 DIAGNOSIS — Z886 Allergy status to analgesic agent status: Secondary | ICD-10-CM

## 2021-02-21 DIAGNOSIS — Z882 Allergy status to sulfonamides status: Secondary | ICD-10-CM

## 2021-02-21 DIAGNOSIS — Z66 Do not resuscitate: Secondary | ICD-10-CM | POA: Diagnosis present

## 2021-02-21 DIAGNOSIS — G309 Alzheimer's disease, unspecified: Secondary | ICD-10-CM | POA: Diagnosis present

## 2021-02-21 DIAGNOSIS — I10 Essential (primary) hypertension: Secondary | ICD-10-CM | POA: Diagnosis present

## 2021-02-21 DIAGNOSIS — I517 Cardiomegaly: Secondary | ICD-10-CM | POA: Diagnosis not present

## 2021-02-21 DIAGNOSIS — E119 Type 2 diabetes mellitus without complications: Secondary | ICD-10-CM | POA: Diagnosis present

## 2021-02-21 DIAGNOSIS — Z85118 Personal history of other malignant neoplasm of bronchus and lung: Secondary | ICD-10-CM

## 2021-02-21 DIAGNOSIS — F0280 Dementia in other diseases classified elsewhere without behavioral disturbance: Secondary | ICD-10-CM

## 2021-02-21 DIAGNOSIS — Z8249 Family history of ischemic heart disease and other diseases of the circulatory system: Secondary | ICD-10-CM

## 2021-02-21 DIAGNOSIS — E039 Hypothyroidism, unspecified: Secondary | ICD-10-CM | POA: Diagnosis present

## 2021-02-21 DIAGNOSIS — Z887 Allergy status to serum and vaccine status: Secondary | ICD-10-CM

## 2021-02-21 DIAGNOSIS — Z79890 Hormone replacement therapy (postmenopausal): Secondary | ICD-10-CM

## 2021-02-21 DIAGNOSIS — Z7901 Long term (current) use of anticoagulants: Secondary | ICD-10-CM

## 2021-02-21 DIAGNOSIS — F0283 Dementia in other diseases classified elsewhere, unspecified severity, with mood disturbance: Secondary | ICD-10-CM | POA: Diagnosis present

## 2021-02-21 DIAGNOSIS — F0284 Dementia in other diseases classified elsewhere, unspecified severity, with anxiety: Secondary | ICD-10-CM | POA: Diagnosis present

## 2021-02-21 DIAGNOSIS — Z7984 Long term (current) use of oral hypoglycemic drugs: Secondary | ICD-10-CM

## 2021-02-21 DIAGNOSIS — Z79899 Other long term (current) drug therapy: Secondary | ICD-10-CM

## 2021-02-21 LAB — CBC WITH DIFFERENTIAL/PLATELET
Abs Immature Granulocytes: 0.05 10*3/uL (ref 0.00–0.07)
Basophils Absolute: 0.1 10*3/uL (ref 0.0–0.1)
Basophils Relative: 1 %
Eosinophils Absolute: 0.3 10*3/uL (ref 0.0–0.5)
Eosinophils Relative: 3 %
HCT: 44.6 % (ref 36.0–46.0)
Hemoglobin: 14.2 g/dL (ref 12.0–15.0)
Immature Granulocytes: 1 %
Lymphocytes Relative: 20 %
Lymphs Abs: 1.8 10*3/uL (ref 0.7–4.0)
MCH: 29.4 pg (ref 26.0–34.0)
MCHC: 31.8 g/dL (ref 30.0–36.0)
MCV: 92.3 fL (ref 80.0–100.0)
Monocytes Absolute: 0.8 10*3/uL (ref 0.1–1.0)
Monocytes Relative: 9 %
Neutro Abs: 5.8 10*3/uL (ref 1.7–7.7)
Neutrophils Relative %: 66 %
Platelets: 303 10*3/uL (ref 150–400)
RBC: 4.83 MIL/uL (ref 3.87–5.11)
RDW: 13.5 % (ref 11.5–15.5)
WBC: 8.8 10*3/uL (ref 4.0–10.5)
nRBC: 0 % (ref 0.0–0.2)

## 2021-02-21 LAB — BASIC METABOLIC PANEL
Anion gap: 10 (ref 5–15)
BUN: 12 mg/dL (ref 8–23)
CO2: 31 mmol/L (ref 22–32)
Calcium: 9 mg/dL (ref 8.9–10.3)
Chloride: 98 mmol/L (ref 98–111)
Creatinine, Ser: 1.08 mg/dL — ABNORMAL HIGH (ref 0.44–1.00)
GFR, Estimated: 51 mL/min — ABNORMAL LOW (ref >60)
Glucose, Bld: 109 mg/dL — ABNORMAL HIGH (ref 70–99)
Potassium: 2.8 mmol/L — ABNORMAL LOW (ref 3.5–5.1)
Sodium: 139 mmol/L (ref 135–145)

## 2021-02-21 LAB — RESP PANEL BY RT-PCR (FLU A&B, COVID) ARPGX2
Influenza A by PCR: NEGATIVE
Influenza B by PCR: NEGATIVE
SARS Coronavirus 2 by RT PCR: POSITIVE — AB

## 2021-02-21 LAB — TROPONIN I (HIGH SENSITIVITY)
Troponin I (High Sensitivity): 8 ng/L (ref <18)
Troponin I (High Sensitivity): 9 ng/L (ref <18)

## 2021-02-21 NOTE — ED Triage Notes (Signed)
Pt reports being diagnosed with the flu 12/26 and has been unable to eat or drink like normal. Pt reports she has a sore throat which makes it hard to swallow. Pt was seen by PCP and was told to come to ER for possible dehydration due to HR of 110.

## 2021-02-21 NOTE — ED Provider Triage Note (Signed)
Emergency Medicine Provider Triage Evaluation Note  Veronica Paul , a 86 y.o. female  was evaluated in triage.  Pt complains of palpitations and shortness of breath.  Patient daughter is primary historian.  States that around Christmas the patient was diagnosed with influenza.  She took 5 days of Tamiflu however she had decreased appetite which has persisted past the original diagnosis.  She states that because of the decreased p.o. intake they were crushing up her medications to give them to her.  She was on the way to see primary care today.  When she got there the physician said that her heart rate was "all over the place" and requested her to go to the emergency department.  Patient has known atrial fibrillation and is anticoagulated on Eliquis.  She also takes diltiazem for rate control.  The daughter states that she has been crushing up the diltiazem and giving it to her over 2 weeks.  Review of Systems  Positive: See above Negative:   Physical Exam  BP 131/75 (BP Location: Right Arm)    Pulse (!) 109    Temp 98.3 F (36.8 C)    Resp 17    LMP 05/11/1992    SpO2 95%  Gen:   Awake, no distress   Resp:  Normal effort  MSK:   Moves extremities without difficulty  Other:  Irregularly irregular rhythm.  Pulses correlate.  Medical Decision Making  Medically screening exam initiated at 5:14 PM.  Appropriate orders placed.  Veronica Paul was informed that the remainder of the evaluation will be completed by another provider, this initial triage assessment does not replace that evaluation, and the importance of remaining in the ED until their evaluation is complete.     Mickie Hillier, PA-C 02/21/21 1715

## 2021-02-21 NOTE — Patient Instructions (Addendum)
If you are age 84 or older, your body mass index should be between 23-30. Your Body mass index is 29.41 kg/m. If this is out of the aforementioned range listed, please consider follow up with your Primary Care Provider.  If you are age 58 or younger, your body mass index should be between 19-25. Your Body mass index is 29.41 kg/m. If this is out of the aformentioned range listed, please consider follow up with your Primary Care Provider.   ________________________________________________________  The [facility] providers would like to encourage you to use [facility] to communicate with providers for non-urgent requests or questions.  Due to long hold times on the telephone, sending your provider a message by [facility] may be a faster and more efficient way to get a response.  Please allow 48 business hours for a response.  Please remember that this is for non-urgent requests.  _______________________________________________________  Please report to the ER at [facility]   It was a pleasure to see you today!  Thank you for trusting me with your gastrointestinal care!

## 2021-02-21 NOTE — Progress Notes (Addendum)
[facility] Office Note:  History: Veronica Paul 02/21/2021  Referring provider: Chevis Pretty, FNP  Reason for consult/chief complaint: Dysphagia (Pt reports she sometimes feels like her throat is "closing up" and she can't swallow her pills x 1-2 months)   Subjective  HPI:  Veronica Paul is here with her daughter today for swallowing difficulty.  For the last couple of months she has had difficulty swallowing pills, where they feel hung up in the throat.  She was able to swallow food and liquids without difficulty until she got an acute flulike illness the day before Christmas with myalgias fever cough throat pain.  She then had much greater acute difficulty swallowing and could hardly keep down any liquids, no food and could not get down her long-acting diltiazem Portal messages on 02/12/2021 to cardiology and primary care regarding patient's sore and swollen throat with difficulty swallowing food and liquids. Saw PCP 02/13/2021 for the symptoms and reportedly slurred speech, CTA head and neck obtained, no CVA seen, report below. EGD December 2020 for left upper quadrant and chest pain, normal exam.  She was able to take some solid food yesterday for the first time in about a week, swallowing some liquids, having her short acting diltiazem and other medicines.   ROS:  Review of Systems  Veronica Paul denies chest pain or dyspnea. Daughter says that she is not quite acting like herself, and sometimes had difficulty finishing words or sentences. Remainder of systems unable to obtain.  Patient has a somewhat distant affect today, and some difficulty following instructions including opening her mouth and sticking her tongue out for exam.  She was able to go on exam table with assistance has a slow gait. Past Medical History: Past Medical History:  Diagnosis Date   Allergy    "my nose runs alot"   Anxiety    Arthritis    Atrial fibrillation ([place])    Blood transfusion  without reported diagnosis    "when I had a miscarrage in 1957"   Cataract    bilateral removed   Cholelithiasis    Constipation    x ray showed increased stool in colon- uses Miralax daily and Dulcolax twice a week    Depression    Diabetes mellitus    Dyslipidemia    HTN (hypertension)    Hx of long-term (current) use of anticoagulants    Hyperlipidemia    Hypertension    Hypothyroidism    Lung cancer ([place])    Obesity    Osteopenia    Vitamin D deficiency      Past Surgical History: Past Surgical History:  Procedure Laterality Date   CATARACT EXTRACTION Bilateral    COLONOSCOPY     COLONOSCOPY WITH PROPOFOL N/A 05/06/2019   Procedure: COLONOSCOPY WITH PROPOFOL;  Surgeon: Jerene Bears, MD;  Location: [facility];  Service: Gastroenterology;  Laterality: N/A;   LUNG REMOVAL, PARTIAL  1998   Right   POLYPECTOMY  05/06/2019   Procedure: POLYPECTOMY;  Surgeon: Jerene Bears, MD;  Location: [facility];  Service: Gastroenterology;;   THYROIDECTOMY     TUBAL LIGATION     UPPER GASTROINTESTINAL ENDOSCOPY       Family History: Family History  Problem Relation Age of Onset   Coronary artery disease Brother 29   Hypertension Brother    Heart disease Brother    Hypertension Mother    Hypertension Father    Heart disease Father    Hypertension Sister    Heart disease  Sister    Cancer Sister    Arthritis Daughter    COPD Daughter    Fibromyalgia Daughter    Ulcerative colitis Son    Stroke Maternal Grandmother    Cancer Brother    Lung cancer Brother    Hypertension Sister    Cancer Sister    Colon cancer Neg Hx    Food intolerance Neg Hx    Esophageal cancer Neg Hx    Rectal cancer Neg Hx    Stomach cancer Neg Hx    Colon polyps Neg Hx     Social History: Social History   Socioeconomic History   Marital status: Widowed    Spouse name: Not on file   Number of children: 4   Years of education: 37   Highest education level: 12th grade  Occupational  History   Occupation: Retired    Comment: Conservation officer, historic buildings  Tobacco Use   Smoking status: Never   Smokeless tobacco: Never  Vaping Use   Vaping Use: Never used  Substance and Sexual Activity   Alcohol use: No   Drug use: No   Sexual activity: Not Currently  Other Topics Concern   Not on file  Social History Narrative   Lives alone - one level   Social Determinants of Health   Financial Resource Strain: Low Risk    Difficulty of Paying Living Expenses: Not very hard  Food Insecurity: No Food Insecurity   Worried About Charity fundraiser in the Last Year: Never true   Ran Out of Food in the Last Year: Never true  Transportation Needs: No Transportation Needs   Lack of Transportation (Medical): No   Lack of Transportation (Non-Medical): No  Physical Activity: Insufficiently Active   Days of Exercise per Week: 7 days   Minutes of Exercise per Session: 20 min  Stress: Stress Concern Present   Feeling of Stress : To some extent  Social Connections: Moderately Isolated   Frequency of Communication with Friends and Family: More than three times a week   Frequency of Social Gatherings with Friends and Family: More than three times a week   Attends Religious Services: 1 to 4 times per year   Active Member of [employer] or Organizations: No   Attends Archivist Meetings: Never   Marital Status: Widowed    Allergies: Allergies  Allergen Reactions   Ace Inhibitors Cough   Feldene [Piroxicam]     REACTION: RASH TO HANDS   Lexapro [Escitalopram]    Meloxicam Nausea And Vomiting   Prevnar [Pneumococcal 13-Val Conj Vacc]    Statins Other (See Comments)    myalgia   Sulfa Antibiotics     Rash    Zithromax [Azithromycin Dihydrate] Itching    Outpatient Meds: Current Outpatient Medications  Medication Sig Dispense Refill   acetaminophen (TYLENOL) 500 MG tablet Take 500 mg by mouth every 6 (six) hours as needed.     apixaban (ELIQUIS) 5 MG TABS tablet Take 1 tablet (5 mg  total) by mouth 2 (two) times daily. 180 tablet 1   bisacodyl (DULCOLAX) 5 MG EC tablet Take 5 mg by mouth daily as needed for moderate constipation (usually twice per week).     Blood Glucose Monitoring Suppl (ONETOUCH VERIO) w/Device KIT Check blood sugars daily Dx E11.9 1 kit 0   diltiazem (CARDIZEM LA) 180 MG 24 hr tablet Take 1 tablet (180 mg total) by mouth 2 (two) times daily. 180 tablet 3   diltiazem (CARDIZEM) 30  MG tablet Take 1 tablet (30 mg total) by mouth 4 (four) times daily. Take one tablet daily at 5 pm 360 tablet 3   doxycycline (VIBRA-TABS) 100 MG tablet Take 1 tablet (100 mg total) by mouth 2 (two) times daily. 1 po bid 20 tablet 0   furosemide (LASIX) 20 MG tablet Take 1 tablet (20 mg total) by mouth daily. 90 tablet 1   HYDROcodone bit-homatropine (HYCODAN) 5-1.5 MG/5ML syrup Take 5 mLs by mouth every 6 (six) hours as needed for cough. 120 mL 0   Lancet Devices (ONETOUCH DELICA PLUS LANCING) MISC 1 Device by Does not apply route daily. 1 each 0   Lancets (ONETOUCH DELICA PLUS [id number]) MISC USE TO CHECK BLOOD SUGARS DAILY DX E11.9 100 each 3   levothyroxine (SYNTHROID) 75 MCG tablet Take 1 tablet (75 mcg total) by mouth daily. 90 tablet 1   metFORMIN (GLUCOPHAGE) 500 MG tablet Take 1 tablet (500 mg total) by mouth daily. 90 tablet 1   metoprolol (TOPROL-XL) 200 MG 24 hr tablet TAKE 1 AND 1/2 TABLET DAILY 135 tablet 1   nitroGLYCERIN (NITROSTAT) 0.4 MG SL tablet Place 1 tablet (0.4 mg total) under the tongue every 5 (five) minutes as needed for chest pain. 25 tablet 1   ONETOUCH VERIO test strip CHECK BLOOD SUGARS DAILY DX E11.9 100 strip 3   polyethylene glycol (MIRALAX / GLYCOLAX) 17 g packet Take 17 g by mouth daily.     sertraline (ZOLOFT) 50 MG tablet TAKE 1 TABLET BY MOUTH EVERY DAY 90 tablet 1   triamcinolone cream (KENALOG) 0.1 % Apply topically daily.     No current facility-administered medications for this visit.       ___________________________________________________________________ Objective   Exam:  BP 120/70    Pulse (!) 115    Ht '5\' 3"'  (1.6 m)    Wt 166 lb (75.3 kg)    LMP 05/11/1992    BMI 29.41 kg/m  Wt Readings from Last 3 Encounters:  02/21/21 166 lb (75.3 kg)  02/13/21 169 lb (76.7 kg)  01/16/21 171 lb (77.6 kg)   Her pulse is 120-1 30 and irregular.  Mucous membranes dry General: As noted above, some difficulty following instructions. Eyes: sclera anicteric, no redness ENT: oral mucosa moist without lesions, no cervical or supraclavicular lymphadenopathy CV: Tachycardic and irregular as noted above no JVD, no peripheral edema Resp: clear to auscultation bilaterally, normal RR and effort noted GI: soft, no tenderness, with active bowel sounds. No guarding or palpable organomegaly noted. Skin; warm and dry, no rash or jaundice noted Neuro: awake, alert and oriented x 3. Normal gross motor function and fluent speech No pronator drift. Left pupil larger than right, both reactive.  (Which her daughter reports was also the case at the recent PCP visit)  no pronator drift. Labs:    Radiologic Studies:   CLINICAL DATA:  Neuro deficit, acute, stroke suspected   EXAM: CT ANGIOGRAPHY HEAD   TECHNIQUE: Multidetector CT imaging of the head was performed using the standard protocol during bolus administration of intravenous contrast. Multiplanar CT image reconstructions and MIPs were obtained to evaluate the vascular anatomy.   CONTRAST:  27m OMNIPAQUE IOHEXOL 350 MG/ML SOLN   COMPARISON:  None.   FINDINGS: CT HEAD   Brain: No evidence of acute infarction, hemorrhage, hydrocephalus, extra-axial collection or mass lesion/mass effect. Frontal predominant atrophy.   Vascular: See below.   Skull: No acute fracture.  Hyperostosis frontalis.   Sinuses: Ethmoid air cell mucosal thickening.  Other: No mastoid effusions   CTA HEAD   Anterior circulation: Patent  intracranial ICAs which are mildly narrowed by calcific atherosclerosis bilateral MCAs and ACAs are patent without proximal hemodynamically significant stenosis. Small/hypoplastic right A1 ACA. No aneurysm identified.   Posterior circulation: Small/non dominant left intradural vertebral artery. Bilateral intradural vertebral arteries, basilar artery, and posterior cerebral arteries are patent without proximal hemodynamically significant stenosis. Small right P1 PCA with right-sided posterior communicating artery, anatomic variant.   Venous sinuses: As permitted by contrast timing, patent.   Anatomic variants: As detailed above.   Review of the MIP images confirms the above findings.   IMPRESSION: 1. No evidence of acute intracranial abnormality. An MRI could provide more sensitive evaluation for acute infarct if clinically indicated. 2. No large vessel occlusion or proximal hemodynamically significant stenosis. 3. 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     Electronically Signed   By: Margaretha Sheffield M.D.   On: 02/13/2021 14:52  Assessment: Encounter Diagnoses  Name Primary?   Pharyngoesophageal dysphagia Yes   Tachycardia     Months of pill dysphagia, probably cricopharyngeal dysfunction, could be laryngeal pathology. More acute symptoms in the wake of an infectious illness that most likely caused laryngeal swelling and greater pharyngeal esophageal dysfunction.  I am concerned that she is volume depleted she has had little food and limited liquids in recent days.  She has also most likely in rapid A. fib.  Her daughter sent some messages to cardiology answered by the nurse, though it is not clear if Dr. Percival Spanish has yet seen or attended to them. I am concerned this patient is at risk with persistent tachycardia and difficulty with oral intake.  When she is over the acute episode, she needs an ENT evaluation and a barium swallow.  I feel she is best evaluated  in the ED today.  We called the triage desk of both [facility] and [facility] ED's, but we were redirected to [facility] because there is potential cardiac issue. Plan:  This patient's daughter will bring her to the [facility] ED for further evaluation.  Total time 35 minutes, over half spent face-to-face with patient and her daughter.  Nelida Meuse III  CC: Referring provider noted above

## 2021-02-22 ENCOUNTER — Emergency Department (HOSPITAL_COMMUNITY): Payer: Medicare HMO

## 2021-02-22 ENCOUNTER — Encounter: Payer: Self-pay | Admitting: Cardiology

## 2021-02-22 ENCOUNTER — Encounter: Payer: Self-pay | Admitting: Gastroenterology

## 2021-02-22 DIAGNOSIS — Z79890 Hormone replacement therapy (postmenopausal): Secondary | ICD-10-CM | POA: Diagnosis not present

## 2021-02-22 DIAGNOSIS — F05 Delirium due to known physiological condition: Secondary | ICD-10-CM | POA: Diagnosis not present

## 2021-02-22 DIAGNOSIS — F32A Depression, unspecified: Secondary | ICD-10-CM | POA: Diagnosis present

## 2021-02-22 DIAGNOSIS — E860 Dehydration: Secondary | ICD-10-CM | POA: Diagnosis not present

## 2021-02-22 DIAGNOSIS — Z7901 Long term (current) use of anticoagulants: Secondary | ICD-10-CM | POA: Diagnosis not present

## 2021-02-22 DIAGNOSIS — R1314 Dysphagia, pharyngoesophageal phase: Secondary | ICD-10-CM | POA: Diagnosis not present

## 2021-02-22 DIAGNOSIS — I10 Essential (primary) hypertension: Secondary | ICD-10-CM | POA: Diagnosis not present

## 2021-02-22 DIAGNOSIS — R69 Illness, unspecified: Secondary | ICD-10-CM | POA: Diagnosis not present

## 2021-02-22 DIAGNOSIS — Z7984 Long term (current) use of oral hypoglycemic drugs: Secondary | ICD-10-CM | POA: Diagnosis not present

## 2021-02-22 DIAGNOSIS — E039 Hypothyroidism, unspecified: Secondary | ICD-10-CM | POA: Diagnosis not present

## 2021-02-22 DIAGNOSIS — Z886 Allergy status to analgesic agent status: Secondary | ICD-10-CM | POA: Diagnosis not present

## 2021-02-22 DIAGNOSIS — E876 Hypokalemia: Secondary | ICD-10-CM | POA: Diagnosis not present

## 2021-02-22 DIAGNOSIS — R059 Cough, unspecified: Secondary | ICD-10-CM | POA: Diagnosis not present

## 2021-02-22 DIAGNOSIS — F0284 Dementia in other diseases classified elsewhere, unspecified severity, with anxiety: Secondary | ICD-10-CM | POA: Diagnosis not present

## 2021-02-22 DIAGNOSIS — I4891 Unspecified atrial fibrillation: Secondary | ICD-10-CM | POA: Diagnosis not present

## 2021-02-22 DIAGNOSIS — Z881 Allergy status to other antibiotic agents status: Secondary | ICD-10-CM | POA: Diagnosis not present

## 2021-02-22 DIAGNOSIS — K224 Dyskinesia of esophagus: Secondary | ICD-10-CM | POA: Diagnosis not present

## 2021-02-22 DIAGNOSIS — U071 COVID-19: Secondary | ICD-10-CM | POA: Diagnosis not present

## 2021-02-22 DIAGNOSIS — R0602 Shortness of breath: Secondary | ICD-10-CM | POA: Diagnosis not present

## 2021-02-22 DIAGNOSIS — G8194 Hemiplegia, unspecified affecting left nondominant side: Secondary | ICD-10-CM | POA: Diagnosis not present

## 2021-02-22 DIAGNOSIS — F0283 Dementia in other diseases classified elsewhere, unspecified severity, with mood disturbance: Secondary | ICD-10-CM | POA: Diagnosis not present

## 2021-02-22 DIAGNOSIS — J988 Other specified respiratory disorders: Secondary | ICD-10-CM | POA: Diagnosis not present

## 2021-02-22 DIAGNOSIS — Z79899 Other long term (current) drug therapy: Secondary | ICD-10-CM | POA: Diagnosis not present

## 2021-02-22 DIAGNOSIS — G309 Alzheimer's disease, unspecified: Secondary | ICD-10-CM | POA: Diagnosis not present

## 2021-02-22 DIAGNOSIS — Z66 Do not resuscitate: Secondary | ICD-10-CM | POA: Diagnosis not present

## 2021-02-22 DIAGNOSIS — R1310 Dysphagia, unspecified: Secondary | ICD-10-CM | POA: Diagnosis not present

## 2021-02-22 DIAGNOSIS — E119 Type 2 diabetes mellitus without complications: Secondary | ICD-10-CM | POA: Diagnosis not present

## 2021-02-22 DIAGNOSIS — I480 Paroxysmal atrial fibrillation: Secondary | ICD-10-CM | POA: Diagnosis not present

## 2021-02-22 DIAGNOSIS — R29818 Other symptoms and signs involving the nervous system: Secondary | ICD-10-CM | POA: Diagnosis not present

## 2021-02-22 DIAGNOSIS — Z882 Allergy status to sulfonamides status: Secondary | ICD-10-CM | POA: Diagnosis not present

## 2021-02-22 DIAGNOSIS — I517 Cardiomegaly: Secondary | ICD-10-CM | POA: Diagnosis not present

## 2021-02-22 DIAGNOSIS — K219 Gastro-esophageal reflux disease without esophagitis: Secondary | ICD-10-CM | POA: Diagnosis not present

## 2021-02-22 LAB — CBC WITH DIFFERENTIAL/PLATELET
Abs Immature Granulocytes: 0.03 10*3/uL (ref 0.00–0.07)
Basophils Absolute: 0 10*3/uL (ref 0.0–0.1)
Basophils Relative: 1 %
Eosinophils Absolute: 0.1 10*3/uL (ref 0.0–0.5)
Eosinophils Relative: 2 %
HCT: 46.1 % — ABNORMAL HIGH (ref 36.0–46.0)
Hemoglobin: 15.1 g/dL — ABNORMAL HIGH (ref 12.0–15.0)
Immature Granulocytes: 0 %
Lymphocytes Relative: 24 %
Lymphs Abs: 2 10*3/uL (ref 0.7–4.0)
MCH: 29.5 pg (ref 26.0–34.0)
MCHC: 32.8 g/dL (ref 30.0–36.0)
MCV: 90 fL (ref 80.0–100.0)
Monocytes Absolute: 1.1 10*3/uL — ABNORMAL HIGH (ref 0.1–1.0)
Monocytes Relative: 13 %
Neutro Abs: 5.1 10*3/uL (ref 1.7–7.7)
Neutrophils Relative %: 60 %
Platelets: 276 10*3/uL (ref 150–400)
RBC: 5.12 MIL/uL — ABNORMAL HIGH (ref 3.87–5.11)
RDW: 13.5 % (ref 11.5–15.5)
WBC: 8.4 10*3/uL (ref 4.0–10.5)
nRBC: 0 % (ref 0.0–0.2)

## 2021-02-22 LAB — COMPREHENSIVE METABOLIC PANEL
ALT: 18 U/L (ref 0–44)
AST: 23 U/L (ref 15–41)
Albumin: 3.6 g/dL (ref 3.5–5.0)
Alkaline Phosphatase: 54 U/L (ref 38–126)
Anion gap: 13 (ref 5–15)
BUN: 10 mg/dL (ref 8–23)
CO2: 29 mmol/L (ref 22–32)
Calcium: 9 mg/dL (ref 8.9–10.3)
Chloride: 98 mmol/L (ref 98–111)
Creatinine, Ser: 0.97 mg/dL (ref 0.44–1.00)
GFR, Estimated: 58 mL/min — ABNORMAL LOW (ref >60)
Glucose, Bld: 115 mg/dL — ABNORMAL HIGH (ref 70–99)
Potassium: 2.8 mmol/L — ABNORMAL LOW (ref 3.5–5.1)
Sodium: 140 mmol/L (ref 135–145)
Total Bilirubin: 0.7 mg/dL (ref 0.3–1.2)
Total Protein: 6.9 g/dL (ref 6.5–8.1)

## 2021-02-22 LAB — MAGNESIUM: Magnesium: 1.9 mg/dL (ref 1.7–2.4)

## 2021-02-22 LAB — TSH: TSH: 3.596 u[IU]/mL (ref 0.350–4.500)

## 2021-02-22 MED ORDER — INSULIN ASPART 100 UNIT/ML IJ SOLN: 0.0000 [IU] | Freq: Three times a day (TID) | INTRAMUSCULAR | Status: DC

## 2021-02-22 MED ORDER — LEVOTHYROXINE SODIUM 75 MCG PO TABS
75.0000 ug | ORAL_TABLET | Freq: Every day | ORAL | Status: DC
  Administered 2021-02-23 – 2021-02-27 (×5): 75 ug via ORAL
  Filled 2021-02-22 (×5): qty 1

## 2021-02-22 MED ORDER — ALBUTEROL SULFATE HFA 108 (90 BASE) MCG/ACT IN AERS
2.0000 | INHALATION_SPRAY | Freq: Four times a day (QID) | RESPIRATORY_TRACT | Status: DC | PRN
  Filled 2021-02-22: qty 6.7

## 2021-02-22 MED ORDER — GUAIFENESIN-DM 100-10 MG/5ML PO SYRP
10.0000 mL | ORAL_SOLUTION | Freq: Four times a day (QID) | ORAL | Status: AC
  Administered 2021-02-22 – 2021-02-24 (×8): 10 mL via ORAL
  Filled 2021-02-22 (×8): qty 10

## 2021-02-22 MED ORDER — METOPROLOL SUCCINATE ER 100 MG PO TB24
300.0000 mg | ORAL_TABLET | Freq: Every day | ORAL | Status: DC
  Administered 2021-02-22 – 2021-02-24 (×3): 300 mg via ORAL
  Filled 2021-02-22 (×4): qty 3

## 2021-02-22 MED ORDER — SERTRALINE HCL 50 MG PO TABS
50.0000 mg | ORAL_TABLET | Freq: Every day | ORAL | Status: DC
  Administered 2021-02-23 – 2021-02-27 (×5): 50 mg via ORAL
  Filled 2021-02-22 (×5): qty 1

## 2021-02-22 MED ORDER — GUAIFENESIN-DM 100-10 MG/5ML PO SYRP: 10.0000 mL | ORAL_SOLUTION | ORAL | Status: DC | PRN

## 2021-02-22 MED ORDER — DILTIAZEM HCL-DEXTROSE 125-5 MG/125ML-% IV SOLN (PREMIX)
5.0000 mg/h | INTRAVENOUS | Status: DC
  Administered 2021-02-22: 5 mg/h via INTRAVENOUS
  Filled 2021-02-22 (×2): qty 125

## 2021-02-22 MED ORDER — SENNOSIDES-DOCUSATE SODIUM 8.6-50 MG PO TABS
2.0000 | ORAL_TABLET | Freq: Two times a day (BID) | ORAL | Status: DC
  Administered 2021-02-22 – 2021-02-27 (×7): 2 via ORAL
  Filled 2021-02-22 (×8): qty 2

## 2021-02-22 MED ORDER — SODIUM CHLORIDE 0.9 % IV BOLUS
1000.0000 mL | Freq: Once | INTRAVENOUS | Status: AC
  Administered 2021-02-22: 1000 mL via INTRAVENOUS

## 2021-02-22 MED ORDER — SENNOSIDES-DOCUSATE SODIUM 8.6-50 MG PO TABS: 1.0000 | ORAL_TABLET | Freq: Every evening | ORAL | Status: DC | PRN

## 2021-02-22 MED ORDER — POLYETHYLENE GLYCOL 3350 17 G PO PACK
17.0000 g | PACK | Freq: Every day | ORAL | Status: DC
  Administered 2021-02-24 – 2021-02-27 (×3): 17 g via ORAL
  Filled 2021-02-22 (×5): qty 1

## 2021-02-22 MED ORDER — POTASSIUM CHLORIDE 10 MEQ/100ML IV SOLN
10.0000 meq | INTRAVENOUS | Status: AC
  Administered 2021-02-22 (×3): 10 meq via INTRAVENOUS
  Filled 2021-02-22 (×3): qty 100

## 2021-02-22 MED ORDER — APIXABAN 5 MG PO TABS
5.0000 mg | ORAL_TABLET | Freq: Two times a day (BID) | ORAL | Status: DC
  Administered 2021-02-22 – 2021-02-27 (×10): 5 mg via ORAL
  Filled 2021-02-22 (×10): qty 1

## 2021-02-22 MED ORDER — ACETAMINOPHEN 325 MG PO TABS
650.0000 mg | ORAL_TABLET | Freq: Four times a day (QID) | ORAL | Status: DC | PRN
  Administered 2021-02-23 – 2021-02-25 (×4): 650 mg via ORAL
  Filled 2021-02-22 (×4): qty 2

## 2021-02-22 MED ORDER — METFORMIN HCL 500 MG PO TABS
500.0000 mg | ORAL_TABLET | Freq: Every day | ORAL | Status: DC
  Administered 2021-02-23: 500 mg via ORAL
  Filled 2021-02-22: qty 1

## 2021-02-22 MED ORDER — POTASSIUM CHLORIDE 20 MEQ PO PACK
40.0000 meq | PACK | Freq: Two times a day (BID) | ORAL | Status: AC
  Administered 2021-02-23 – 2021-02-24 (×4): 40 meq via ORAL
  Filled 2021-02-22 (×5): qty 2

## 2021-02-22 MED ORDER — ACETAMINOPHEN 650 MG RE SUPP: 650.0000 mg | Freq: Four times a day (QID) | RECTAL | Status: DC | PRN

## 2021-02-22 MED ORDER — LACTATED RINGERS IV SOLN: INTRAVENOUS | Status: AC

## 2021-02-22 NOTE — ED Notes (Signed)
Pt given crackers and water per MD

## 2021-02-22 NOTE — ED Notes (Signed)
Patient transported to X-ray 

## 2021-02-22 NOTE — ED Provider Notes (Signed)
[facility] EMERGENCY DEPARTMENT Provider Note   CSN: [id number] Arrival date & time: 02/21/21  1503     History  Chief Complaint  Patient presents with   Dehydration    Veronica Paul is a 86 y.o. female.  The history is provided by the patient, the spouse and medical records. No language interpreter was used.  Cough Cough characteristics:  Productive Sputum characteristics:  White Severity:  Moderate Onset quality:  Gradual Timing:  Constant Progression:  Unchanged Chronicity:  New Relieved by:  Nothing Worsened by:  Nothing Ineffective treatments:  None tried Associated symptoms: chills, shortness of breath and sore throat   Associated symptoms: no chest pain, no fever, no headaches and no wheezing   Risk factors: recent infection       Home Medications Prior to Admission medications   Medication Sig Start Date End Date Taking? Authorizing Provider  acetaminophen (TYLENOL) 500 MG tablet Take 500 mg by mouth every 6 (six) hours as needed for moderate pain, headache or fever.   Yes [provider]  apixaban (ELIQUIS) 5 MG TABS tablet Take 1 tablet (5 mg total) by mouth 2 (two) times daily. 08/21/20  Yes Hassell Done, Mary-Margaret, FNP  bisacodyl (DULCOLAX) 5 MG EC tablet Take 5 mg by mouth daily as needed for moderate constipation (usually twice per week).   Yes [provider]  diltiazem (CARDIZEM) 30 MG tablet Take 1 tablet (30 mg total) by mouth 4 (four) times daily. Take one tablet daily at 5 pm Patient taking differently: Take 30 mg by mouth 4 (four) times daily. 02/20/21  Yes Minus Breeding, MD  furosemide (LASIX) 20 MG tablet Take 1 tablet (20 mg total) by mouth daily. 11/21/20  Yes Martin, Mary-Margaret, FNP  HYDROcodone bit-homatropine (HYCODAN) 5-1.5 MG/5ML syrup Take 5 mLs by mouth every 6 (six) hours as needed for cough. 02/16/21  Yes Hassell Done, Mary-Margaret, FNP  levothyroxine (SYNTHROID) 75 MCG tablet Take 1 tablet (75 mcg total) by  mouth daily. 08/21/20  Yes Hassell Done, Mary-Margaret, FNP  metFORMIN (GLUCOPHAGE) 500 MG tablet Take 1 tablet (500 mg total) by mouth daily. Patient taking differently: Take 500 mg by mouth every evening. 08/21/20  Yes Hassell Done, Mary-Margaret, FNP  nitroGLYCERIN (NITROSTAT) 0.4 MG SL tablet Place 1 tablet (0.4 mg total) under the tongue every 5 (five) minutes as needed for chest pain. 11/21/20  Yes Hassell Done, Mary-Margaret, FNP  [facility] VERIO test strip CHECK BLOOD SUGARS DAILY DX E11.9 09/19/20  Yes Hassell Done, Mary-Margaret, FNP  polyethylene glycol (MIRALAX / GLYCOLAX) 17 g packet Take 17 g by mouth daily as needed for mild constipation.   Yes [provider]  sertraline (ZOLOFT) 50 MG tablet TAKE 1 TABLET BY MOUTH EVERY DAY Patient taking differently: Take 50 mg by mouth daily. 01/07/21  Yes Hassell Done, Mary-Margaret, FNP  Blood Glucose Monitoring Suppl [facility] VERIO) w/Device KIT Check blood sugars daily Dx E11.9 08/24/18   Hassell Done, Mary-Margaret, FNP  diltiazem (CARDIZEM LA) 180 MG 24 hr tablet Take 1 tablet (180 mg total) by mouth 2 (two) times daily. Patient not taking: Reported on 02/22/2021 02/01/21   Minus Breeding, MD  doxycycline (VIBRA-TABS) 100 MG tablet Take 1 tablet (100 mg total) by mouth 2 (two) times daily. 1 po bid Patient not taking: Reported on 02/22/2021 11/12/20   Chevis Pretty, FNP  Lancet Devices [facility] DELICA PLUS LANCING) MISC 1 Device by Does not apply route daily. 03/01/20   Chevis Pretty, FNP  Lancets (ONETOUCH DELICA PLUS [id number]) [place] USE TO CHECK  BLOOD SUGARS DAILY DX E11.9 11/26/20   Hassell Done, Mary-Margaret, FNP  metoprolol (TOPROL-XL) 200 MG 24 hr tablet TAKE 1 AND 1/2 TABLET DAILY Patient taking differently: Take 300 mg by mouth every evening. 05/21/20   Hassell Done Mary-Margaret, FNP      Allergies    Ace inhibitors, Feldene [piroxicam], Lexapro [escitalopram], Meloxicam, Prevnar [pneumococcal 13-val conj vacc], Statins, Sulfa antibiotics, and Zithromax  [azithromycin dihydrate]    Review of Systems   Review of Systems  Constitutional:  Positive for chills and fatigue. Negative for fever.  HENT:  Positive for sore throat and trouble swallowing. Negative for congestion.   Eyes:  Negative for visual disturbance.  Respiratory:  Positive for cough and shortness of breath. Negative for chest tightness and wheezing.   Cardiovascular:  Negative for chest pain, palpitations and leg swelling.  Gastrointestinal:  Negative for abdominal pain, constipation, diarrhea, nausea and vomiting.  Genitourinary:  Positive for decreased urine volume. Negative for dysuria and flank pain.  Musculoskeletal:  Negative for back pain and neck pain.  Neurological:  Negative for headaches.  Psychiatric/Behavioral:  Negative for agitation and confusion.   All other systems reviewed and are negative.  Physical Exam Updated Vital Signs BP 135/69    Pulse 77    Temp 98.1 F (36.7 C) (Oral)    Resp 18    LMP 05/11/1992    SpO2 99%  Physical Exam Vitals and nursing note reviewed.  Constitutional:      General: She is not in acute distress.    Appearance: She is well-developed. She is ill-appearing. She is not toxic-appearing or diaphoretic.  HENT:     Head: Normocephalic and atraumatic.     Nose: Congestion and rhinorrhea present.     Mouth/Throat:     Mouth: Mucous membranes are dry.     Pharynx: No oropharyngeal exudate or posterior oropharyngeal erythema.  Eyes:     Extraocular Movements: Extraocular movements intact.     Conjunctiva/sclera: Conjunctivae normal.     Pupils: Pupils are equal, round, and reactive to light.  Cardiovascular:     Rate and Rhythm: Tachycardia present. Rhythm irregular.     Heart sounds: No murmur heard. Pulmonary:     Effort: Pulmonary effort is normal. No respiratory distress.     Breath sounds: Rhonchi present. No wheezing or rales.  Chest:     Chest wall: No tenderness.  Abdominal:     Palpations: Abdomen is soft.      Tenderness: There is no abdominal tenderness. There is no right CVA tenderness, left CVA tenderness, guarding or rebound.  Musculoskeletal:        General: No swelling or tenderness.     Cervical back: Neck supple. No tenderness.  Skin:    General: Skin is warm and dry.     Capillary Refill: Capillary refill takes less than 2 seconds.     Findings: No erythema.  Neurological:     General: No focal deficit present.     Mental Status: She is alert.     Sensory: No sensory deficit.     Motor: No weakness.  Psychiatric:        Mood and Affect: Mood normal.    ED Results / Procedures / Treatments   Labs (all labs ordered are listed, but only abnormal results are displayed) Labs Reviewed  RESP PANEL BY RT-PCR (FLU A&B, COVID) ARPGX2 - Abnormal; Notable for the following components:      Result Value   SARS Coronavirus 2 by RT  PCR POSITIVE (*)    All other components within normal limits  BASIC METABOLIC PANEL - Abnormal; Notable for the following components:   Potassium 2.8 (*)    Glucose, Bld 109 (*)    Creatinine, Ser 1.08 (*)    GFR, Estimated 51 (*)    All other components within normal limits  CBC WITH DIFFERENTIAL/PLATELET - Abnormal; Notable for the following components:   RBC 5.12 (*)    Hemoglobin 15.1 (*)    HCT 46.1 (*)    Monocytes Absolute 1.1 (*)    All other components within normal limits  COMPREHENSIVE METABOLIC PANEL - Abnormal; Notable for the following components:   Potassium 2.8 (*)    Glucose, Bld 115 (*)    GFR, Estimated 58 (*)    All other components within normal limits  URINE CULTURE  CBC WITH DIFFERENTIAL/PLATELET  TSH  MAGNESIUM  URINALYSIS, ROUTINE W REFLEX MICROSCOPIC  TROPONIN I (HIGH SENSITIVITY)  TROPONIN I (HIGH SENSITIVITY)    EKG EKG Interpretation  Date/Time:  Thursday February 21 2021 16:57:27 EST Ventricular Rate:  108 PR Interval:    QRS Duration: 94 QT Interval:  352 QTC Calculation: 471 R Axis:   61 Text  Interpretation: Atrial fibrillation with rapid ventricular response with premature ventricular or aberrantly conducted complexes Incomplete right bundle branch block Anteroseptal infarct , age undetermined Abnormal ECG When compared with ECG of 05-May-2019 13:05, PREVIOUS ECG IS PRESENT When compared to prior, faster rate. No STEMI Confirmed by Antony Blackbird [id number]) on 02/22/2021 10:04:12 AM  Radiology DG Chest 2 View  Result Date: 02/21/2021 CLINICAL DATA:  Cough and shortness of breath. EXAM: CHEST - 2 VIEW COMPARISON:  Chest radiograph 05/05/2019. FINDINGS: Anti lordotic positioning limits assessment. Cardiomegaly, chronic and likely unchanged. Surgical clips at the right hilum with chronic volume loss in the right hemithorax. Opacity at the right lung base may be related to scarring and positioning, however is suboptimally assessed on the current exam. No pulmonary edema. No pneumothorax. No large pleural effusion. No acute osseous abnormalities are seen. IMPRESSION: 1. Anti lordotic positioning. Postsurgical change in the right hemithorax. Opacity at the right lung base may be related to scarring and positioning, however is suboptimally assessed on the current exam. Consider PA and lateral views when patient is able. 2. Chronic cardiomegaly. Electronically Signed   By: Keith Rake M.D.   On: 02/21/2021 17:55    Procedures Procedures    CRITICAL CARE Performed by: Gwenyth Allegra Amran Malter Total critical care time: 35 minutes Critical care time was exclusive of separately billable procedures and treating other patients. Critical care was necessary to treat or prevent imminent or life-threatening deterioration. Critical care was time spent personally by me on the following activities: development of treatment plan with patient and/or surrogate as well as nursing, discussions with consultants, evaluation of patient's response to treatment, examination of patient, obtaining history from patient or  surrogate, ordering and performing treatments and interventions, ordering and review of laboratory studies, ordering and review of radiographic studies, pulse oximetry and re-evaluation of patient's condition.   Medications Ordered in ED Medications  sodium chloride 0.9 % bolus 1,000 mL (has no administration in time range)  diltiazem (CARDIZEM) 125 mg in dextrose 5% 125 mL (1 mg/mL) infusion (has no administration in time range)  potassium chloride 10 mEq in 100 mL IVPB (has no administration in time range)    ED Course/ Medical Decision Making/ A&P  Medical Decision Making   KATIRA DUMAIS is a 86 y.o. female with a past medical history significant for atrial fibrillation on Eliquis therapy, hypertension, hyperlipidemia, hypothyroidism, diabetes, anxiety, depression, and previous lung cancer who presents at the direction from her gastroenterologist office for A. fib with RVR, dehydration, and upper respiratory infection.  According to Dr. Loletha Carrow who called me as well as family, since Christmas, patient has been having worsening URI symptoms.  She reportedly was tested in late December and was negative for COVID but over the last week has had worsening productive cough, fatigue, occasional chills, and is having worsened sore throat.  She reports that she is having extreme difficult time swallowing food and pills and they are having to crush some of her medications and make some medication changes to allow her to keep medicines in.  They feel she is getting dehydrated.  She went to see her GI team yesterday and he was concerned when she was tachycardic, fatigued, appearing dehydrated, and continued to have productive cough.  Of note, patient waited approximately 19 hours and 30 minutes in the waiting room while awaiting my evaluation.  Otherwise, patient is reporting some decreased urine, constipation, fatigue, and the cough.  She is denying any chest pain but does report  shortness of breath.  Family is very concerned about the trajectory of her illness especially over the last 48 hours.  On my exam when she got back to her room, lungs were coarse bilaterally.  Chest and abdomen were nontender.  Bowel sounds were appreciated.  Patient moving all extremities.  Legs were nontender on exam.  Patient's heart rate when she was connected to monitor revealed A. fib with RVR with a rate in the 140s.  Repeat EKG will be obtained.  As patient had some blood work in triage nearly 17 hours ago, we will repeat it.  Patient was found on initial labs to have hypokalemia with a potassium of 2.8.  Will order IV potassium and try to get repeat blood work collected.  We will give patient some fluids.  Due to the A. fib with RVR, will start diltiazem drip.  We will get repeat chest x-ray because the x-ray in triage showed opacity on the 1 view and 2 view is recommended.  Clinically I am concerned that patient has worsening dehydration and inability to take home medications due to the upper respiratory infection with COVID.  I am also concerned she could be developing pneumonia given the productive cough evolution and the chest x-ray findings.  After repeat labs, hydration, delta, and x-ray, anticipate she will need admission.  We will also get urinalysis given the urinary changes.  Anticipate admission work-up is completed.  12:08 PM Repeat chest x-ray shows stability and does not show convincing evidence of pneumonia however her heart rate is still quite variable getting the diltiazem drip.  Repeat labs confirmed the hypokalemia but otherwise TSH was normal and magnesium was not low.  Troponin negative.,  She is still not urinated and this likely speaks to some of the dehydration troubles.  Due to the A. fib with RVR, fatigue, and decreased oral intake with hypokalemia, do not feel she is safe for discharge home on diltiazem drip either.  Due to her sore throat and COVID and her PCP  concern, do not feel she is able to adequately maintain medication use to treat this at home.  Will call for admission        Final Clinical Impression(s) / ED  Diagnoses Final diagnoses:  Dehydration  COVID-19  Atrial fibrillation with RVR (HCC)  Hypokalemia    Clinical Impression: 1. COVID-19   2. Dehydration   3. Atrial fibrillation with RVR ([place])   4. Hypokalemia     Disposition: Admit  This note was prepared with assistance of Dragon voice recognition software. Occasional wrong-word or sound-a-like substitutions may have occurred due to the inherent limitations of voice recognition software.      Kmari Halter, Gwenyth Allegra, MD 02/22/21 1253

## 2021-02-22 NOTE — Hospital Course (Addendum)
#  Afib with RVR Patient was in ED waiting room for about 20 hours and did not receive any of her home medications at this time. Upon initial evaluation, she was in Afib with RVR, with HR in the 140s. Patient was started on a dilt drip at this time. Tachycardia could be secondary to missed dilt doses vs dehydration.TSH within normal limits. Patient was able to be weaned off of the dilt drip and was transitioned back to po cardizen 30 mq q6 and metoprolol succinate 300 mg daily, however, had to have the dilt drip restarted in the setting of her dysphagia and poor ability to keep her medications down. Dilt drip turned off on 1/10 and patient was transitioned back to cardizem 30 mg q6 and was also transitioned from metoprolol succinate to tartrate, 150 mg bid, as both of these medications can be crushed. Increased cardizem to 60 mg q6 on discharge and continued metoprolol tartrate 150 mg bid and eliquis 5 mg bid. Recommend close follow up with cardiologist and PCP at discharge.   #Dysphagia 2/2 narrowing at gastroesophageal junction  Patient endorsing dysphagia that has been ongoing for about 2 months. She has been evaluated by GI in the outpatient setting and it was thought that this issue was more oropharyngeal in nature. Patient had modified barium swallow on 1/9 that showed slightly prolonged oral phase with slow oral transit. Patient also had esophogram which showed dysmotility and mild narrowing at the GE junction, although suspect that most of this patient's symptoms are related to declining functional status. The patient's daughter has noted that at recent dental visits, staff have commented that she has food retained in her mouth, which has led to many cavities recently. Recommend outpatient palliative care services, as I suspect these swallow issues will progress due to dementia. Started and continued on dysphagia diet. Recommend Boost/Ensure drinks for nutrition, as well.   #COVID-19 Patient endorse  having a sore throat and cough, that started after Christmas. Tested negative for COVID at that time, however, tested positive in the ED. Patient was not treated with antivirals or steroids, as there was likely little benefit, given that she remained on room air throughout admission. Treated supportively and symptomatically improved prior to discharge.   #Dementia #Deconditioning Patient was recommended to go to SNF by PT, however, suspect that with this patient's dementia, and hospital delirium, that she would likely do better at home. Her two daughters have been assisting the patient with much of her care recently, and will work to see if they can provide 24/7 assistance, as she would likely do better at home in the long-term. Will discharge to home with home health PT/OT/SLP.   #Anxiety #Depression Resumed home zoloft 50 mg   #Hypothyroidism Resumed home synthroid 75 mcg. TSH within normal limits.   #Type 2 diabetes Last a1c 5.6 in October. Discontinued home metformin 500 mg qhs, as there is likely little benefit at this point.

## 2021-02-22 NOTE — H&P (Signed)
Date: 02/22/2021               Patient Name:  Veronica Paul MRN: [id number]  DOB: 1935-08-18 Age / Sex: 86 y.o., female   PCP: Chevis Pretty, FNP         Medical Service: Internal Medicine Teaching Service         Attending Physician: Dr. Evette Doffing, Mallie Mussel, *    First Contact: Dr. Raymondo Band Pager: [phone]  Second Contact: Dr. Collene Gobble Pager: [phone]       After Hours (After 5p/  First Contact Pager: [phone]  weekends / holidays): Second Contact Pager: [phone]   Chief Complaint: cough, dysphagia, and dehydration  History of Present Illness: Veronica Paul is an 86 yo female with Afib on Eliquis, HTN, HLD, diabetes, anxiety, depression, and hx of lung cancer who presented to the hospital from her gastroenterologist's office with concerns for Afib with RVR, dehydration, dysphagia, and cough/sore throat.  History is obtained from the patient and her daughter, Veronica Paul. The patient has been experiencing a productive cough and sore throat since Christmas. She tested negative for COVID at this time, however, over the past week, her cough and sore throat have worsened, and she has been experiencing fatigue and chills. The patient has also had a difficult time with swallowing food and her pills, and she has had to have her pills crushed over this time. With her difficulty swallowing, she has felt dehydrated and she went to see her GI doctor yesterday (Dr. Loletha Carrow), who sent her in for further evaluation of her tachycardia, fatigue, and dehydration.   Of note, the patient was in the ED waiting room for approximately 20 hours and did not have any of her home medications during this time. She denies any chest pain, but states that she feels like her heart is racing. She denies any pain, aside from the pain she is experiencing in her throat. She also notes that she has been coughing up a clear phlegm. Denies any fevers, chest pain, shortness of breath, abd pain, n/v/d, or any urinary sxs at  this time.   Meds:  Eliquis 5 mg bid Dilt 30 mg qid Lasix 20 mg daily Synthroid 75 mcg Metformin 500 mg daily Nitro 0.4 mg prn Zoloft 50 mg   Current Meds  Medication Sig   acetaminophen (TYLENOL) 500 MG tablet Take 500 mg by mouth every 6 (six) hours as needed for moderate pain, headache or fever.   apixaban (ELIQUIS) 5 MG TABS tablet Take 1 tablet (5 mg total) by mouth 2 (two) times daily.   bisacodyl (DULCOLAX) 5 MG EC tablet Take 5 mg by mouth daily as needed for moderate constipation (usually twice per week).   diltiazem (CARDIZEM) 30 MG tablet Take 1 tablet (30 mg total) by mouth 4 (four) times daily. Take one tablet daily at 5 pm (Patient taking differently: Take 30 mg by mouth 4 (four) times daily.)   furosemide (LASIX) 20 MG tablet Take 1 tablet (20 mg total) by mouth daily.   HYDROcodone bit-homatropine (HYCODAN) 5-1.5 MG/5ML syrup Take 5 mLs by mouth every 6 (six) hours as needed for cough.   levothyroxine (SYNTHROID) 75 MCG tablet Take 1 tablet (75 mcg total) by mouth daily.   metFORMIN (GLUCOPHAGE) 500 MG tablet Take 1 tablet (500 mg total) by mouth daily. (Patient taking differently: Take 500 mg by mouth every evening.)   nitroGLYCERIN (NITROSTAT) 0.4 MG SL tablet Place 1 tablet (0.4 mg total) under the  tongue every 5 (five) minutes as needed for chest pain.   ONETOUCH VERIO test strip CHECK BLOOD SUGARS DAILY DX E11.9   polyethylene glycol (MIRALAX / GLYCOLAX) 17 g packet Take 17 g by mouth daily as needed for mild constipation.   sertraline (ZOLOFT) 50 MG tablet TAKE 1 TABLET BY MOUTH EVERY DAY (Patient taking differently: Take 50 mg by mouth daily.)     Allergies: Allergies as of 02/21/2021 - Review Complete 02/21/2021  Allergen Reaction Noted   Ace inhibitors Cough 06/10/2010   Feldene [piroxicam]  01/11/2008   Lexapro [escitalopram]  09/13/2020   Meloxicam Nausea And Vomiting 06/10/2010   Prevnar [pneumococcal 13-val conj vacc]  05/30/2013   Statins Other (See  Comments) 09/11/2015   Sulfa antibiotics  05/03/2014   Zithromax [azithromycin dihydrate] Itching 06/10/2010   Past Medical History:  Diagnosis Date   Allergy    "my nose runs alot"   Anxiety    Arthritis    Atrial fibrillation ([place])    Blood transfusion without reported diagnosis    "when I had a miscarrage in 1957"   Cataract    bilateral removed   Cholelithiasis    Constipation    x ray showed increased stool in colon- uses Miralax daily and Dulcolax twice a week    Depression    Diabetes mellitus    Dyslipidemia    HTN (hypertension)    Hx of long-term (current) use of anticoagulants    Hyperlipidemia    Hypertension    Hypothyroidism    Lung cancer (HCC)    Obesity    Osteopenia    Vitamin D deficiency     Family History: HTN (mother and father), CAD in father and brother   Social History: PCP is [facility] [employer] in [place] by herself, has children nearby who check on her Able to complete ADLs and IADLs at baseline No tobacco, alcohol, or other drug use.   Review of Systems: A complete ROS was negative except as per HPI.   Physical Exam: Blood pressure (!) 149/86, pulse (!) 114, temperature 98.1 F (36.7 C), temperature source Oral, resp. rate (!) 23, last menstrual period 05/11/1992, SpO2 100 %. Physical Exam Constitutional:      General: She is not in acute distress. HENT:     Head: Normocephalic and atraumatic.     Mouth/Throat:     Mouth: Mucous membranes are dry.     Pharynx: Posterior oropharyngeal erythema present.  Eyes:     Extraocular Movements: Extraocular movements intact.     Pupils: Pupils are equal, round, and reactive to light.  Cardiovascular:     Rate and Rhythm: Tachycardia present. Rhythm irregular.     Pulses: Normal pulses.     Heart sounds: No murmur heard. Pulmonary:     Effort: Pulmonary effort is normal. No respiratory distress.     Comments: Coarse breath sounds in bilateral lung bases Abdominal:      General: Bowel sounds are normal. There is no distension.     Palpations: Abdomen is soft.     Tenderness: There is no abdominal tenderness.  Musculoskeletal:     Right lower leg: Edema present.     Left lower leg: Edema present.     Comments: Trace pitting edema in b/l LE  Skin:    Comments: Cool and dry  Neurological:     General: No focal deficit present.     Mental Status: She is alert. Mental status is at baseline.  Comments: Slowed speech, inappropriate speech at times (confuses yes and no)  Psychiatric:        Mood and Affect: Mood normal.        Behavior: Behavior normal.     EKG: personally reviewed my interpretation is Afib with RVR  CXR: personally reviewed my interpretation is no acute cardiopulmonary process  Assessment & Plan by Problem: Principal Problem:   COVID-19 virus infection  #Afib with RVR Patient was in ED waiting room for about 20 hours and did not receive any of her home medications at this time. Upon initial evaluation, she was in Afib with RVR, with HR in the 140s. Patient was started on a dilt drip, although HR still remains variable, typically in the 120s upon my evaluation. Tachycardia could be secondary to missed dilt doses vs dehydration.TSH within normal limits.  - Continue dilt drip, will resume oral dilt when HR better controlled - Resume eliquis 5 mg bid - Resume metoprolol 300 mg daily  - Tele monitoring - Maintain K>4, Mg >2  #COVID-19 Patient endorse having a sore throat and cough, that started after Christmas. She denies any shortness of breath and remains off of supplemental oxygen at this time. Tested positive for COVID in the ED. Patient is thrice vaccinated. Will treat supportively. - Robitussin q6h  - Albuterol q6h prn  - Maintain SpO2 >90% - Airborne and contact precautions  #Hypokalemia K 2.8 in the ED and patient received Kcl 10 mEq x 3 in the ED.  - Kclor 40 mEq x 2 - Follow up BMP  #Dehydration Patient clinically  appears dehydrated on exam, although kidney function is within normal limits. Received 1 L NS bolus in the ED.  - Encourage po intake - LR 75 cc/hr x 12 hrs - Holding home lasix   #Type 2 diabetes Last a1c 5.6 in October. On metformin 500 mg qhs at home. - Continue metformin   #Anxiety #Depression Resumed home zoloft 50 mg  #Hypothyroidism Resumed home synthroid 75 mcg. TSH within normal limits.   Best practices: Code: Full VTE: Eliquis 5 mg bid IVF: LR 75 cc/hr x 12 hrs Diet: dysphagia diet   Dispo: Admit patient to Inpatient with expected length of stay greater than 2 midnights.  SignedDorethea Clan, DO 02/22/2021, 1:40 PM  Pager: [phone] After 5pm on weekdays and 1pm on weekends: On Call pager: [phone]

## 2021-02-23 ENCOUNTER — Inpatient Hospital Stay (HOSPITAL_COMMUNITY): Payer: Medicare HMO

## 2021-02-23 DIAGNOSIS — E876 Hypokalemia: Secondary | ICD-10-CM | POA: Diagnosis not present

## 2021-02-23 DIAGNOSIS — E860 Dehydration: Secondary | ICD-10-CM

## 2021-02-23 DIAGNOSIS — U071 COVID-19: Secondary | ICD-10-CM

## 2021-02-23 DIAGNOSIS — R1310 Dysphagia, unspecified: Secondary | ICD-10-CM

## 2021-02-23 DIAGNOSIS — I4891 Unspecified atrial fibrillation: Secondary | ICD-10-CM | POA: Diagnosis not present

## 2021-02-23 LAB — URINALYSIS, ROUTINE W REFLEX MICROSCOPIC
Bilirubin Urine: NEGATIVE
Glucose, UA: NEGATIVE mg/dL
Hgb urine dipstick: NEGATIVE
Ketones, ur: NEGATIVE mg/dL
Nitrite: NEGATIVE
Protein, ur: NEGATIVE mg/dL
Specific Gravity, Urine: 1.015 (ref 1.005–1.030)
pH: 7.5 (ref 5.0–8.0)

## 2021-02-23 LAB — CBC
HCT: 42 % (ref 36.0–46.0)
Hemoglobin: 14.1 g/dL (ref 12.0–15.0)
MCH: 30 pg (ref 26.0–34.0)
MCHC: 33.6 g/dL (ref 30.0–36.0)
MCV: 89.4 fL (ref 80.0–100.0)
Platelets: 289 10*3/uL (ref 150–400)
RBC: 4.7 MIL/uL (ref 3.87–5.11)
RDW: 13.5 % (ref 11.5–15.5)
WBC: 7.5 10*3/uL (ref 4.0–10.5)
nRBC: 0 % (ref 0.0–0.2)

## 2021-02-23 LAB — GLUCOSE, CAPILLARY
Glucose-Capillary: 115 mg/dL — ABNORMAL HIGH (ref 70–99)
Glucose-Capillary: 96 mg/dL (ref 70–99)

## 2021-02-23 LAB — BASIC METABOLIC PANEL
Anion gap: 10 (ref 5–15)
Anion gap: 8 (ref 5–15)
BUN: 7 mg/dL — ABNORMAL LOW (ref 8–23)
BUN: 10 mg/dL (ref 8–23)
CO2: 25 mmol/L (ref 22–32)
CO2: 26 mmol/L (ref 22–32)
Calcium: 8.5 mg/dL — ABNORMAL LOW (ref 8.9–10.3)
Calcium: 9 mg/dL (ref 8.9–10.3)
Chloride: 104 mmol/L (ref 98–111)
Chloride: 107 mmol/L (ref 98–111)
Creatinine, Ser: 0.81 mg/dL (ref 0.44–1.00)
Creatinine, Ser: 0.85 mg/dL (ref 0.44–1.00)
GFR, Estimated: >60 mL/min (ref >60)
GFR, Estimated: >60 mL/min (ref >60)
Glucose, Bld: 118 mg/dL — ABNORMAL HIGH (ref 70–99)
Glucose, Bld: 97 mg/dL (ref 70–99)
Potassium: 2.9 mmol/L — ABNORMAL LOW (ref 3.5–5.1)
Potassium: 3.6 mmol/L (ref 3.5–5.1)
Sodium: 139 mmol/L (ref 135–145)
Sodium: 141 mmol/L (ref 135–145)

## 2021-02-23 LAB — URINALYSIS, MICROSCOPIC (REFLEX)

## 2021-02-23 LAB — HEMOGLOBIN A1C
Hgb A1c MFr Bld: 6.1 % — ABNORMAL HIGH (ref 4.8–5.6)
Mean Plasma Glucose: 128.37 mg/dL

## 2021-02-23 LAB — D-DIMER, QUANTITATIVE: D-Dimer, Quant: <0.27 ug/mL-FEU (ref 0.00–0.50)

## 2021-02-23 LAB — C-REACTIVE PROTEIN: CRP: 0.8 mg/dL (ref <1.0)

## 2021-02-23 LAB — FERRITIN: Ferritin: 141 ng/mL (ref 11–307)

## 2021-02-23 LAB — MAGNESIUM: Magnesium: 1.8 mg/dL (ref 1.7–2.4)

## 2021-02-23 LAB — LACTATE DEHYDROGENASE: LDH: 160 U/L (ref 98–192)

## 2021-02-23 LAB — FIBRINOGEN: Fibrinogen: 489 mg/dL — ABNORMAL HIGH (ref 210–475)

## 2021-02-23 MED ORDER — ENSURE ENLIVE PO LIQD
237.0000 mL | Freq: Three times a day (TID) | ORAL | Status: DC
  Administered 2021-02-23 – 2021-02-27 (×13): 237 mL via ORAL

## 2021-02-23 MED ORDER — SODIUM CHLORIDE 0.9 % IV SOLN: INTRAVENOUS | Status: DC | PRN

## 2021-02-23 MED ORDER — POTASSIUM CHLORIDE 10 MEQ/100ML IV SOLN
10.0000 meq | INTRAVENOUS | Status: AC
  Administered 2021-02-23 (×4): 10 meq via INTRAVENOUS
  Filled 2021-02-23 (×4): qty 100

## 2021-02-23 MED ORDER — DILTIAZEM HCL 30 MG PO TABS
30.0000 mg | ORAL_TABLET | Freq: Four times a day (QID) | ORAL | Status: DC
  Administered 2021-02-23 (×5): 30 mg via ORAL
  Filled 2021-02-23 (×5): qty 1

## 2021-02-23 MED ORDER — ORAL CARE MOUTH RINSE
15.0000 mL | Freq: Two times a day (BID) | OROMUCOSAL | Status: DC
  Administered 2021-02-23 – 2021-02-27 (×9): 15 mL via OROMUCOSAL

## 2021-02-23 NOTE — Evaluation (Signed)
Occupational Therapy Evaluation Patient Details Name: Veronica Paul MRN: [id number] DOB: September 26, 1935 Today's Date: 02/23/2021   History of Present Illness Veronica Paul is an 86 yo female admitted 02/21/21 with Afib with RVR, dehydration, dysphagia, fatigue, chills, and productive cough/sore throat. PMH includes Afib on Eliquis, HTN, HLD, diabetes, anxiety, depression, and hx of lung cancer   Clinical Impression   Pt is typically independent in ADL and mobility, and lives alone. Her daugters check on her daily and provide food for her (she no longer cooks). She has had expressive word difficulties for some time according to her daughter (the past 1.5 years). Today she presents with L sided weakness, cognitive deficits (awareness, safety, problem solving,  memory), These are new and MD team notified. Today she was overall mod A for LB ADL, min to to set up for UB ADL, and min A for transfers and short in room mobility without DME. The next time that we see her I would also like to investigate vision - she wears glasses at baseline but I did notice some disconjugate gaze - Pt was eating lunch at this point. - She often defers to her daughter when asked questions. OT will continue to follow and will focus skilled intervention on cognition strategies, HEP for Lside strength and motor coordination, as well as functional independence during transfers and ADL. Currently her daughters are planning on providing 24 hour care and so recommending HHOT as follow up - should she decline in anyway SNF or acute inpatient rehab should be considered. They will need a 3 in 1 for home environment.      Recommendations for follow up therapy are one component of a multi-disciplinary discharge planning process, led by the attending physician.  Recommendations may be updated based on patient status, additional functional criteria and insurance authorization.   Follow Up Recommendations  Home health OT    Assistance  Recommended at Discharge Frequent or constant Supervision/Assistance  Patient can return home with the following A little help with walking and/or transfers;A lot of help with bathing/dressing/bathroom;Assistance with cooking/housework;Direct supervision/assist for medications management;Direct supervision/assist for financial management;Assist for transportation;Help with stairs or ramp for entrance    Functional Status Assessment  Patient has had a recent decline in their functional status and demonstrates the ability to make significant improvements in function in a reasonable and predictable amount of time.  Equipment Recommendations  BSC/3in1    Recommendations for Other Services PT consult     Precautions / Restrictions Precautions Precautions: Fall Restrictions Weight Bearing Restrictions: No      Mobility Bed Mobility               General bed mobility comments: sitting EOB when OT arrived    Transfers Overall transfer level: Needs assistance Equipment used: 1 person hand held assist Transfers: Sit to/from Stand;Bed to chair/wheelchair/BSC Sit to Stand: Min assist     Step pivot transfers: Min assist (HHA)     General transfer comment: min A for balance, no line awareness      Balance Overall balance assessment: Needs assistance Sitting-balance support: Feet supported;No upper extremity supported Sitting balance-Leahy Scale: Fair Sitting balance - Comments: able to sit EOB unchallenged without LOB   Standing balance support: Single extremity supported Standing balance-Leahy Scale: Poor Standing balance comment: able to static stand with SUE during rear peri care without LOB  ADL either performed or assessed with clinical judgement   ADL Overall ADL's : Needs assistance/impaired Eating/Feeding: Minimal assistance;Sitting;Cueing for safety Eating/Feeding Details (indicate cue type and reason): had to cut up food, Pt  able to manipulate utensils, scoop food, and partially open milk container Grooming: Minimal assistance;Sitting   Upper Body Bathing: Minimal assistance;Sitting   Lower Body Bathing: Moderate assistance;Sitting/lateral leans   Upper Body Dressing : Minimal assistance;Sitting   Lower Body Dressing: Maximal assistance Lower Body Dressing Details (indicate cue type and reason): to don socks Toilet Transfer: Minimal assistance;BSC/3in1 Toilet Transfer Details (indicate cue type and reason): HHA Toileting- Clothing Manipulation and Hygiene: Maximal assistance;Sit to/from stand Toileting - Clothing Manipulation Details (indicate cue type and reason): Pt able to maintain standing and OT performed rear peri care     Functional mobility during ADLs: Minimal assistance (HHA) General ADL Comments: cognitive issues impacting ability perform ADL independently.     Vision Baseline Vision/History: 1 Wears glasses Vision Assessment?: Vision impaired- to be further tested in functional context (noticed disconjugate gaze and odd eye movement - however Pt fatigued from previous evaluation during session and too fatigued to participate in visual assessment at this time.) Additional Comments: continue to evaluate     Perception     Praxis      Pertinent Vitals/Pain Pain Assessment: Faces Faces Pain Scale: No hurt Pain Intervention(s): Monitored during session     Hand Dominance Right   Extremity/Trunk Assessment Upper Extremity Assessment Upper Extremity Assessment: LUE deficits/detail LUE Deficits / Details: unable to perform finger to thumb, even after hand over hand with demonstration (more cognitive than physical) although generalized strength testing was 4/5 grossly LUE Sensation: decreased proprioception LUE Coordination: decreased fine motor;decreased gross motor   Lower Extremity Assessment Lower Extremity Assessment: Defer to PT evaluation;LLE deficits/detail LLE Deficits /  Details: generally weaker than Left, trouble coordination motion for MMT   Cervical / Trunk Assessment Cervical / Trunk Assessment: Kyphotic   Communication Communication Communication: Expressive difficulties (for the past 2 years)   Cognition Arousal/Alertness: Awake/alert Behavior During Therapy: Flat affect Overall Cognitive Status: Impaired/Different from baseline Area of Impairment: Attention;Memory;Following commands;Safety/judgement;Awareness;Problem solving                   Current Attention Level: Selective Memory: Decreased short-term memory Following Commands: Follows one step commands with increased time Safety/Judgement: Decreased awareness of safety;Decreased awareness of deficits Awareness: Emergent Problem Solving: Slow processing;Decreased initiation;Difficulty sequencing;Requires verbal cues General Comments: requiring multimodal cues for MMT, and fine motor coordination evaluation.     General Comments  Daughter present throughout session and very helpful in providing history on patient, eager to assist and helpful in general    Exercises     Shoulder Instructions      Home Living Family/patient expects to be discharged to:: Private residence Living Arrangements: Alone Available Help at Discharge: Family;Available 24 hours/day Type of Home: House Home Access: Ramped entrance     Home Layout: One level     Bathroom Shower/Tub: Occupational psychologist: Standard Bathroom Accessibility: Yes How Accessible: Accessible via walker Home Equipment: Grab bars - toilet;Grab bars - tub/shower;Hand held shower head;Shower seat          Prior Functioning/Environment Prior Level of Function : Patient poor historian/Family not available;Needs assist       Physical Assist : ADLs (physical)   ADLs (physical): IADLs (cleaning and cooking are done by daughters) Mobility Comments: does not use DME at baseline ADLs Comments: does  own  toileting, bathing, and dressing        OT Problem List: Decreased strength;Decreased activity tolerance;Impaired balance (sitting and/or standing);Decreased coordination;Decreased cognition;Decreased safety awareness;Impaired UE functional use      OT Treatment/Interventions: Self-care/ADL training    OT Goals(Current goals can be found in the care plan section) Acute Rehab OT Goals Patient Stated Goal: to eat some food OT Goal Formulation: With patient/family Time For Goal Achievement: 03/09/21 Potential to Achieve Goals: Good ADL Goals Pt Will Perform Grooming: with supervision;standing Pt Will Perform Upper Body Dressing: sitting;with modified independence Pt Will Perform Lower Body Dressing: with supervision;sit to/from stand Pt Will Transfer to Toilet: with supervision;ambulating Pt Will Perform Toileting - Clothing Manipulation and hygiene: with min guard assist;sit to/from stand Pt/caregiver will Perform Home Exercise Program: Left upper extremity;Increased strength;With written HEP provided Additional ADL Goal #1: Pt will follow multistep commands 80% of the time implementing cognitive compensatory strategies  OT Frequency: Min 2X/week    Co-evaluation              AM-PAC OT "6 Clicks" Daily Activity     Outcome Measure Help from another person eating meals?: A Little Help from another person taking care of personal grooming?: A Little Help from another person toileting, which includes using toliet, bedpan, or urinal?: A Lot Help from another person bathing (including washing, rinsing, drying)?: A Little Help from another person to put on and taking off regular upper body clothing?: A Little Help from another person to put on and taking off regular lower body clothing?: A Lot 6 Click Score: 16   End of Session Equipment Utilized During Treatment: Gait belt Nurse Communication: Mobility status;Other (comment) (no chair alarm)  Activity Tolerance: Patient tolerated  treatment well Patient left: in chair;with call bell/phone within reach;with family/visitor present (no chair alarm - RN and daughter aware)  OT Visit Diagnosis: Unsteadiness on feet (R26.81);Muscle weakness (generalized) (M62.81);Other symptoms and signs involving the nervous system (R29.898);Other symptoms and signs involving cognitive function;Other (comment) (left sided weakness and mild coordination deficits)                Time: 3212-2482 OT Time Calculation (min): 39 min Charges:  OT General Charges $OT Visit: 1 Visit OT Evaluation $OT Eval Moderate Complexity: 1 Mod OT Treatments $Self Care/Home Management : 23-37 mins  Jesse Sans OTR/L Acute Rehabilitation Services Pager: [phone] Office: [place] 02/23/2021, 2:36 PM

## 2021-02-23 NOTE — Evaluation (Signed)
Clinical/Bedside Swallow Evaluation Patient Details  Name: Veronica Paul MRN: [id number] Date of Birth: 23-Jun-1935  Today's Date: 02/23/2021 Time: SLP Start Time (ACUTE ONLY): 1025 SLP Stop Time (ACUTE ONLY): 8527 SLP Time Calculation (min) (ACUTE ONLY): 47 min  Past Medical History:  Past Medical History:  Diagnosis Date   Allergy    "my nose runs alot"   Anxiety    Arthritis    Atrial fibrillation ([place])    Blood transfusion without reported diagnosis    "when I had a miscarrage in 1957"   Cataract    bilateral removed   Cholelithiasis    Constipation    x ray showed increased stool in colon- uses Miralax daily and Dulcolax twice a week    Depression    Diabetes mellitus    Dyslipidemia    HTN (hypertension)    Hx of long-term (current) use of anticoagulants    Hyperlipidemia    Hypertension    Hypothyroidism    Lung cancer ([place])    Obesity    Osteopenia    Vitamin D deficiency    Past Surgical History:  Past Surgical History:  Procedure Laterality Date   CATARACT EXTRACTION Bilateral    COLONOSCOPY     COLONOSCOPY WITH PROPOFOL N/A 05/06/2019   Procedure: COLONOSCOPY WITH PROPOFOL;  Surgeon: Jerene Bears, MD;  Location: [facility];  Service: Gastroenterology;  Laterality: N/A;   LUNG REMOVAL, PARTIAL  1998   Right   POLYPECTOMY  05/06/2019   Procedure: POLYPECTOMY;  Surgeon: Jerene Bears, MD;  Location: [facility];  Service: Gastroenterology;;   THYROIDECTOMY     TUBAL LIGATION     UPPER GASTROINTESTINAL ENDOSCOPY     HPI:  Veronica Paul is an 86 yo female with Afib on Eliquis, HTN, HLD, diabetes, anxiety, depression, and hx of lung cancer who presented to the hospital from her gastroenterologist's office with concerns for Afib with RVR, dehydration, dysphagia, and cough/sore throat..  Was to see GI for dysphagia.  Dysphagia to pills and throat feeling like "closing up"  cxr Stable postoperative chest without definite acute findings. H/o lung cancer.  Pt's  daughter Veronica Paul present and reports pt with dysphagia - sounds oral - difficulty transiting food into pharynx - since early November.  Pt with weight loss approx 20 pounds since fall 2022.  Pt with pain with partials and they have been adjusted several times.  Speech and swallow eval ordered.    Assessment / Plan / Recommendation  Clinical Impression  Pt presents with concern for cognitive based dysphagia c/b prolonged oral manipulation/mastication with solids and with oral retention of dry french toast on left lateral sulci without sensation.  Suspect potential esophageal component (? reflux) given pt report of "feeling throat is closing up".   Baseline cough noted due to COVID - that occured both with and without intake. Pt with productive cough, clearing viscous yellow tinged secretions and once expectoration completed - no cough with po observed. Poor intake during breakfast limiting swallow evaluation.  Oral based deficits reported by pt and daughter which likely is cognitive based.   Ill fitting partials (that cause discomfort per pt/daughter)may contribute to oral deficits as well.    Delayed processing of information noted along with receptive language deficits *Pt stating "yes" when means "no" per daughter x 2.5 years ----  ? source.     Dysphagia onset November 2022 per daughter - requirng pt to crush her medications.  SLP educated daughter and pt to compensation  strategies for oral deficits using teach back.  Will follow up for cognitive linguistic eval and dysphagia treatment/education/compensations.   Per RN, pt to have DG esophagus - which will likely help elucidate dysphagia. SLP Visit Diagnosis: Dysphagia, oral phase (R13.11)    Aspiration Risk  Mild aspiration risk    Diet Recommendation Dysphagia 3 (Mech soft);Thin liquid   Liquid Administration via: Cup;Straw Medication Administration: Crushed with puree Supervision: Patient able to self feed;Intermittent supervision to cue for  compensatory strategies Compensations: Slow rate;Small sips/bites;Lingual sweep for clearance of pocketing Postural Changes: Seated upright at 90 degrees;Remain upright for at least 30 minutes after po intake    Other  Recommendations Oral Care Recommendations: Oral care BID    Recommendations for follow up therapy are one component of a multi-disciplinary discharge planning process, led by the attending physician.  Recommendations may be updated based on patient status, additional functional criteria and insurance authorization.  Follow up Recommendations Skilled nursing-short term rehab (<3 hours/day)      Assistance Recommended at Discharge    Functional Status Assessment Patient has had a recent decline in their functional status and demonstrates the ability to make significant improvements in function in a reasonable and predictable amount of time.  Frequency and Duration min 2x/week  2 weeks       Prognosis Prognosis for Safe Diet Advancement: Fair Barriers to Reach Goals: Time post onset      Swallow Study   General Date of Onset: 02/23/21 HPI: Veronica Paul is an 86 yo female with Afib on Eliquis, HTN, HLD, diabetes, anxiety, depression, and hx of lung cancer who presented to the hospital from her gastroenterologist's office with concerns for Afib with RVR, dehydration, dysphagia, and cough/sore throat..  Was to see GI for dysphagia.  Dysphagia to pills and throat feeling like "closing up"  cxr Stable postoperative chest without definite acute findings. H/o lung cancer.  Pt's daughter Veronica Paul present and reports pt with dysphagia - sounds oral - difficulty transiting food into pharynx - since early November.  Pt with weight loss approx 20 pounds since fall 2022.  Pt with pain with partials and they have been adjusted several times.  Speech and swallow eval ordered. Type of Study: Bedside Swallow Evaluation Previous Swallow Assessment: none in the system, per rn- esophagram  ordered Diet Prior to this Study: Dysphagia 3 (soft);Thin liquids Temperature Spikes Noted: No Respiratory Status: Room air History of Recent Intubation: No Behavior/Cognition: Alert;Cooperative;Pleasant mood;Other (Comment) (delayed processing) Oral Cavity Assessment: Within Functional Limits Oral Care Completed by SLP: Other (Comment) (instructed daughter to help pt with oral care after meals) Oral Cavity - Dentition: Missing dentition;Other (Comment) (partials present and placed by pt) Vision: Functional for self-feeding Self-Feeding Abilities: Able to feed self Patient Positioning: Upright in bed Baseline Vocal Quality: Low vocal intensity Volitional Cough: Strong Volitional Swallow: Able to elicit    Oral/Motor/Sensory Function Overall Oral Motor/Sensory Function: Within functional limits (pt did not consistently follow directions most notably with sustained phonation)   Ice Chips Ice chips: Not tested   Thin Liquid Presentation: Self Fed;Straw Pharyngeal  Phase Impairments: Cough - Immediate Other Comments: After secretion clearance, pt with no indication of aspiration with po.    Nectar Thick Nectar Thick Liquid: Within functional limits Presentation: Self Fed;Straw   Honey Thick Honey Thick Liquid: Not tested   Puree Puree: Within functional limits Presentation: Self Fed;Spoon   Solid     Solid: Impaired Presentation: Self Fed Oral Phase Impairments: Impaired mastication;Reduced lingual movement/coordination  Oral Phase Functional Implications: Impaired mastication;Oral residue (left lateral sulci)      Macario Golds. Kathleen Lime, [place] Office [phone] Cell [phone]  02/23/2021,9:38 AM

## 2021-02-23 NOTE — Evaluation (Signed)
Physical Therapy Evaluation Patient Details Name: Veronica Paul MRN: [id number] DOB: 1935-11-08 Today's Date: 02/23/2021  History of Present Illness  Veronica Paul is an 86 yo female admitted 02/21/21 with Afib with RVR, dehydration, dysphagia, fatigue, chills, and productive cough/sore throat. PMH includes Afib on Eliquis, HTN, HLD, diabetes, anxiety, depression, and hx of lung cancer   Clinical Impression  Pt presents with condition above and deficits mentioned below, see PT Problem List. PTA, she was independent without an AD, living alone in a house with a ramped entrance. Pt's daughter is very involved and willing to assist as needed. Currently, pt displays expressive deficits (present for past 2 years per daughter) along with deficits in cognition, balance, L leg coordination, and L leg strength. She is at high risk for falls, supported by her DGI score of 7 today. Pt had bouts of LOB, needing up to min-modA to recover, when trying to perform dynamic gait tasks without an UE support. Pt also requiring minA for bed mobility and transfers along with continual simple multi-modal single step cues. Pending MRI results, pt would greatly benefit from intensive therapy in the inpatient rehab setting to maximize her return to being independent and living alone, as she expressed this is important to her. Will continue to follow acutely.        Recommendations for follow up therapy are one component of a multi-disciplinary discharge planning process, led by the attending physician.  Recommendations may be updated based on patient status, additional functional criteria and insurance authorization.  Follow Up Recommendations Acute inpatient rehab (3hours/day)    Assistance Recommended at Discharge Frequent or constant Supervision/Assistance  Patient can return home with the following  A little help with walking and/or transfers;A little help with bathing/dressing/bathroom;Assistance with  cooking/housework;Assistance with feeding;Direct supervision/assist for medications management;Direct supervision/assist for financial management;Assist for transportation;Help with stairs or ramp for entrance    Equipment Recommendations Rolling walker (2 wheels)  Recommendations for Other Services  Rehab consult    Functional Status Assessment Patient has had a recent decline in their functional status and demonstrates the ability to make significant improvements in function in a reasonable and predictable amount of time.     Precautions / Restrictions Precautions Precautions: Fall Precaution Comments: expressive deficits, L sided deficits (awaiting MRI) Restrictions Weight Bearing Restrictions: No      Mobility  Bed Mobility Overal bed mobility: Needs Assistance Bed Mobility: Sit to Supine       Sit to supine: Min assist   General bed mobility comments: MinA to manage trunk safely to supine, extra time to manage L leg onto bed.    Transfers Overall transfer level: Needs assistance Equipment used: Rolling walker (2 wheels) Transfers: Sit to/from Stand Sit to Stand: Min assist           General transfer comment: Pt twisting trunk towards R with power up to stand from recliner, minA to steady and power up to stand.    Ambulation/Gait Ambulation/Gait assistance: Mod assist;Min assist;Min guard Gait Distance (Feet): 115 Feet Assistive device: Rolling walker (2 wheels);None Gait Pattern/deviations: Step-to pattern;Decreased step length - right;Decreased stance time - left;Decreased stride length;Shuffle;Decreased dorsiflexion - left Gait velocity: reduced Gait velocity interpretation: <1.31 ft/sec, indicative of household ambulator   General Gait Details: Pt with slow gait. Fairly steady with RW, needing only min guard assist. However, when trying to ambulate without UE support which she does at baseline, she requires up to min-modA during bouts of LOB to maintain her  safety.  Pt with very slow gait without UE support. Pt displays decreased L stance time and thus R step length and decreased bil (L>R) feet clearance. Pt unable to change speeds or directions quickly and unable to tolerate more than min perturbations without LOB. Pt also with instability noted when trying to change head positions.  Stairs            Wheelchair Mobility    Modified Rankin (Stroke Patients Only) Modified Rankin (Stroke Patients Only) Pre-Morbid Rankin Score: No symptoms Modified Rankin: Moderately severe disability     Balance Overall balance assessment: Needs assistance Sitting-balance support: Feet supported;No upper extremity supported Sitting balance-Leahy Scale: Fair Sitting balance - Comments: able to sit EOB unchallenged without LOB   Standing balance support: Bilateral upper extremity supported;No upper extremity supported;During functional activity Standing balance-Leahy Scale: Fair Standing balance comment: Able to ambulate without UE support but needs up to min-modA during LOB bouts to recover, does better with RW.                 Standardized Balance Assessment Standardized Balance Assessment : Dynamic Gait Index   Dynamic Gait Index Level Surface: Moderate Impairment Change in Gait Speed: Moderate Impairment Gait with Horizontal Head Turns: Severe Impairment Gait with Vertical Head Turns: Moderate Impairment Gait and Pivot Turn: Mild Impairment Step Over Obstacle: Moderate Impairment Step Around Obstacles: Severe Impairment Steps: Moderate Impairment (assumed, did not test) Total Score: 7       Pertinent Vitals/Pain Pain Assessment: Faces Faces Pain Scale: No hurt Pain Intervention(s): Monitored during session    Home Living Family/patient expects to be discharged to:: Private residence Living Arrangements: Alone Available Help at Discharge: Family;Available 24 hours/day Type of Home: House Home Access: Ramped entrance        Home Layout: One level Home Equipment: Grab bars - toilet;Grab bars - tub/shower;Hand held shower head;Shower seat      Prior Function Prior Level of Function : Patient poor historian/Family not available;Needs assist       Physical Assist : ADLs (physical)   ADLs (physical): IADLs (cleaning and cooking are done by daughters) Mobility Comments: does not use DME at baseline ADLs Comments: does own toileting, bathing, and dressing     Hand Dominance   Dominant Hand: Right    Extremity/Trunk Assessment   Upper Extremity Assessment Upper Extremity Assessment: Defer to OT evaluation    Lower Extremity Assessment Lower Extremity Assessment: LLE deficits/detail LLE Deficits / Details: MMT scores of 4 hip flexion (4+ on R), 4 knee extension (4+ on R), 4- ankle dorsiflexion (4+ on R); denies numbness/tingling; dysdiadochokinesia noted on L LLE Coordination: decreased fine motor;decreased gross motor    Cervical / Trunk Assessment Cervical / Trunk Assessment: Kyphotic  Communication   Communication: Expressive difficulties (for the past 2 years; mixes yes/no up often)  Cognition Arousal/Alertness: Awake/alert Behavior During Therapy: Flat affect Overall Cognitive Status: Impaired/Different from baseline Area of Impairment: Attention;Memory;Following commands;Safety/judgement;Awareness;Problem solving                   Current Attention Level: Selective Memory: Decreased short-term memory Following Commands: Follows one step commands with increased time;Follows multi-step commands inconsistently;Follows one step commands consistently Safety/Judgement: Decreased awareness of safety;Decreased awareness of deficits Awareness: Emergent Problem Solving: Slow processing;Decreased initiation;Difficulty sequencing;Requires verbal cues General Comments: Requires multi-modal cues for all tasks. Benefits from demonstration for higher level activities/cues, like navigating around  cones. Can follow simple single-step commands with dealy accurately but unable to follow multi-step commands.  General Comments General comments (skin integrity, edema, etc.): Provided extensive info on options of therapy follow-up: CIR vs HH vs OP, agreed to recommendation for CIR at this time pending MRI results etc. Educated pt and daughter that if she goes home she will likely need near 24/7 assist and a RW    Exercises     Assessment/Plan    PT Assessment Patient needs continued PT services  PT Problem List Decreased strength;Decreased activity tolerance;Decreased mobility;Decreased balance;Decreased coordination;Decreased cognition;Decreased knowledge of use of DME;Decreased safety awareness       PT Treatment Interventions DME instruction;Gait training;Stair training;Functional mobility training;Therapeutic activities;Therapeutic exercise;Balance training;Neuromuscular re-education;Cognitive remediation;Patient/family education    PT Goals (Current goals can be found in the Care Plan section)  Acute Rehab PT Goals Patient Stated Goal: to be independent and live alone PT Goal Formulation: With patient/family Time For Goal Achievement: 03/09/21 Potential to Achieve Goals: Good    Frequency Min 4X/week     Co-evaluation               AM-PAC PT "6 Clicks" Mobility  Outcome Measure Help needed turning from your back to your side while in a flat bed without using bedrails?: A Little Help needed moving from lying on your back to sitting on the side of a flat bed without using bedrails?: A Little Help needed moving to and from a bed to a chair (including a wheelchair)?: A Little Help needed standing up from a chair using your arms (e.g., wheelchair or bedside chair)?: A Little Help needed to walk in hospital room?: A Lot Help needed climbing 3-5 steps with a railing? : Total 6 Click Score: 15    End of Session Equipment Utilized During Treatment: Gait  belt Activity Tolerance: Patient tolerated treatment well Patient left: in bed;with call bell/phone within reach;with bed alarm set;with family/visitor present Nurse Communication: Mobility status PT Visit Diagnosis: Unsteadiness on feet (R26.81);Other abnormalities of gait and mobility (R26.89);Muscle weakness (generalized) (M62.81);Difficulty in walking, not elsewhere classified (R26.2);Other symptoms and signs involving the nervous system (R29.898)    Time: 1950-9326 PT Time Calculation (min) (ACUTE ONLY): 36 min   Charges:   PT Evaluation $PT Eval Moderate Complexity: 1 Mod PT Treatments $Gait Training: 8-22 mins        Moishe Spice, PT, DPT Acute Rehabilitation Services  Pager: [phone] Office: [place] 02/23/2021, 5:43 PM

## 2021-02-23 NOTE — Progress Notes (Addendum)
HD#1 SUBJECTIVE:  Patient Summary: Veronica Paul is a 86 y.o. with a pertinent PMH of Afib on Eliquis, HTN, HLD, diabetes, anxiety, depression, and hx of lung cancer who presented with concerns for Afib with RVR, dehydration, dysphagia, and sore throat and admitted for Afib with RVR and COVID-19.   Overnight Events: No acute events overnight  Interim History: This is hospital day 1 for Veronica Paul who was seen and evaluated at the bedside, with her daughter present. She states that she feels better today, and her daughter confirms that she looks better today. She still has a productive cough and the patient is unsure how much food she was able to eat. Continues to deny chest pain.   OBJECTIVE:  Vital Signs: Vitals:   02/23/21 0128 02/23/21 0129 02/23/21 0130 02/23/21 0452  BP:    (!) 142/72  Pulse: 87 85 100   Resp: 20     Temp:    98.2 F (36.8 C)  TempSrc:    Oral  SpO2: 99% 98% 98% 97%  Weight:    75.6 kg  Height:       Supplemental O2: Room Air SpO2: 97 %  Filed Weights   02/23/21 0117 02/23/21 0452  Weight: 76.4 kg 75.6 kg     Intake/Output Summary (Last 24 hours) at 02/23/2021 0556 Last data filed at 02/23/2021 0504 Gross per 24 hour  Intake 912.31 ml  Output 450 ml  Net 462.31 ml   Net IO Since Admission: 462.31 mL [02/23/21 0556]  Physical Exam: General: Elderly female laying in bed. No acute distress. CV: Regular rate, irregular rhythm. No murmurs. Trace LE edema Pulmonary: Lungs CTAB. Normal effort on room air.  Abdominal: Soft, nontender, nondistended.  Extremities: Palpable radial and DP pulses.  Skin: Cool and dry.  Neuro: Alert, mental status at baseline. Expressive aphasia with inappropriate speech at times Psych: Normal mood and affect     ASSESSMENT/PLAN:  Assessment: Principal Problem:   COVID-19 virus infection   Plan: #Afib with RVR Patient was started on a cardizem drip in the ED, as she was in Afib with RVR. HR remains improved  this morning, with rates in the 80s, although she remains in Afib.  Continues to deny chest pain and now is denying heart palpitations. - Wean off cardizem drip, transition to home po cardizem 30 mg qid - Continue metoprolol 300 mg daily - Discontinue tele - Maintain K>4, Mg >2  #Dysphagia Patient endorses difficulty swallowing that started before her sore throat. She has had to crush her meds recently because of this. She and her daughter deny any episodes of choking/aspiration. Per OT, the patient appears to be having left sided weakness now.  - MRI brain to rule out acute infarction  - SLP evaluation - Esophagram when able (likely after the weekend) - Modified barium swallow study   #COVID-19 Patient endorse having a sore throat and cough, that started after Christmas. She denies any shortness of breath and remains off of supplemental oxygen at this time. Tested positive for COVID in the ED. Patient is thrice vaccinated. Will treat supportively. - Robitussin q6h  - Albuterol q6h prn  - Maintain SpO2 >90% - Airborne and contact precautions   #Hypokalemia K 2.8 in the ED and patient received Kcl 10 mEq x 3 in the ED and K remains low this morning at 2.9. Hypokalemia likely secondary to poor oral intake and lasix. Patient declined oral potassium supplementation yesterday.  - IV Kcl 10 mEq  x4  - Kclor 40 mEq bid  #Type 2 diabetes Last a1c 5.6 in October, it is 6.1 this admission. Given advanced age, there is no significant benefit to continuing metformin 500 mg daily and will discontinue this. CBGs 115-118. No need for frequent CBGs or SSI.   #Anxiety #Depression Continued home zoloft 50 mg   #Hypothyroidism Continued home synthroid 75 mcg  Best Practice: Diet: Dysphagia IVF: Fluids: none VTE: Eliquis  Code: Full AB: None Therapy Recs: Pending Family Contact: Daughter, Veronica Paul, at bedside. DISPO: Anticipated discharge  in 2-3 days  pending Medical  stability.  Signature: Buddy Duty, D.O.  Internal Medicine Resident, PGY-1 [facility]  Pager: [phone] 5:56 AM, 02/23/2021   Please contact the on call pager after 5 pm and on weekends at [phone].

## 2021-02-23 NOTE — Progress Notes (Signed)
Pt arrived from MRI. Placed on tele. Daughter at  bedside.

## 2021-02-23 NOTE — Progress Notes (Signed)
Initial Nutrition Assessment  DOCUMENTATION CODES:   Not applicable  INTERVENTION:  Provide Ensure Enlive po TID, each supplement provides 350 kcal and 20 grams of protein.  Encourage adequate PO intake.   NUTRITION DIAGNOSIS:   Inadequate oral intake related to dysphagia as evidenced by meal completion < 50%.  GOAL:   Patient will meet greater than or equal to 90% of their needs  MONITOR:   PO intake, Supplement acceptance, Diet advancement, Skin, Weight trends, Labs, I & O's  REASON FOR ASSESSMENT:   Consult Assessment of nutrition requirement/status  ASSESSMENT:   86 yo female with Afib on Eliquis, HTN, HLD, diabetes, anxiety, depression, and hx of lung cancer who presented for Afib with RVR, dehydration, dysphagia, and cough/sore throat. Pt COVID+.  Pt is currently on a dysphagia 3 diet with thin liquids. Spoke with daughter via phone. Pt with poor po intake since admission and able to consume a couple of bites of food at breakfast. Prior to admission, daughter reports pt was only able to consume soft foods such as mashed potatoes, soups, due to dysphagia, however a couple of days prior to admission pt po was improving and was able to tolerate and consume more solid foods such as chicken salad and meatloaf. Pt consumes Ensure at home BID to aid in caloric and protein needs. Noted, pt with a 8.5% weight loss in 5 months per weight records. RD to order nutritional supplements to aid in po intake and adequate nutrition. Unable to complete Nutrition-Focused physical exam at this time.   Labs and medications reviewed.   Diet Order:   Diet Order             DIET DYS 3 Room service appropriate? Yes; Fluid consistency: Thin  Diet effective now                   EDUCATION NEEDS:   Not appropriate for education at this time  Skin:  Skin Assessment: Reviewed RN Assessment  Last BM:  Unknown  Height:   Ht Readings from Last 1 Encounters:  02/23/21 5\' 3"  (1.6 m)     Weight:   Wt Readings from Last 1 Encounters:  02/23/21 75.6 kg   BMI:  Body mass index is 29.53 kg/m.  Estimated Nutritional Needs:   Kcal:  1700-1900  Protein:  80-90 grams  Fluid:  >/= 1.7 L/day  Corrin Parker, MS, RD, LDN RD pager number/after hours weekend pager number on Amion.

## 2021-02-23 NOTE — TOC CM/SW Note (Signed)
CSW acknowledges consult for SNF/HH. The patient will require PT/OT evaluations. TOC will assist with disposition planning once the evaluations have been completed.  °  °TOC will continue to follow.    °

## 2021-02-23 NOTE — Progress Notes (Addendum)
Pt's HR goes up to 150s - 170s when pt has coughing spells. HR goes back down to baseline in the 90s-100s afterwards.

## 2021-02-24 DIAGNOSIS — R1310 Dysphagia, unspecified: Secondary | ICD-10-CM

## 2021-02-24 LAB — BASIC METABOLIC PANEL
Anion gap: 11 (ref 5–15)
BUN: 8 mg/dL (ref 8–23)
CO2: 25 mmol/L (ref 22–32)
Calcium: 9 mg/dL (ref 8.9–10.3)
Chloride: 105 mmol/L (ref 98–111)
Creatinine, Ser: 0.8 mg/dL (ref 0.44–1.00)
GFR, Estimated: >60 mL/min (ref >60)
Glucose, Bld: 106 mg/dL — ABNORMAL HIGH (ref 70–99)
Potassium: 3.7 mmol/L (ref 3.5–5.1)
Sodium: 141 mmol/L (ref 135–145)

## 2021-02-24 LAB — FIBRINOGEN: Fibrinogen: 508 mg/dL — ABNORMAL HIGH (ref 210–475)

## 2021-02-24 LAB — MAGNESIUM: Magnesium: 1.8 mg/dL (ref 1.7–2.4)

## 2021-02-24 LAB — C-REACTIVE PROTEIN: CRP: 0.8 mg/dL (ref <1.0)

## 2021-02-24 LAB — FERRITIN: Ferritin: 140 ng/mL (ref 11–307)

## 2021-02-24 LAB — D-DIMER, QUANTITATIVE: D-Dimer, Quant: 0.37 ug/mL-FEU (ref 0.00–0.50)

## 2021-02-24 LAB — LACTATE DEHYDROGENASE: LDH: 171 U/L (ref 98–192)

## 2021-02-24 MED ORDER — MAGNESIUM SULFATE 2 GM/50ML IV SOLN
2.0000 g | Freq: Once | INTRAVENOUS | Status: AC
  Administered 2021-02-24: 2 g via INTRAVENOUS
  Filled 2021-02-24: qty 50

## 2021-02-24 MED ORDER — PHENOL 1.4 % MT LIQD
1.0000 | OROMUCOSAL | Status: DC | PRN
  Administered 2021-02-24 – 2021-02-25 (×2): 1 via OROMUCOSAL
  Filled 2021-02-24: qty 177

## 2021-02-24 MED ORDER — DILTIAZEM HCL 60 MG PO TABS
60.0000 mg | ORAL_TABLET | Freq: Four times a day (QID) | ORAL | Status: DC
  Administered 2021-02-24: 60 mg via ORAL
  Filled 2021-02-24: qty 1

## 2021-02-24 MED ORDER — DILTIAZEM LOAD VIA INFUSION
20.0000 mg | Freq: Once | INTRAVENOUS | Status: AC
  Administered 2021-02-24: 20 mg via INTRAVENOUS
  Filled 2021-02-24: qty 20

## 2021-02-24 MED ORDER — DILTIAZEM HCL-DEXTROSE 125-5 MG/125ML-% IV SOLN (PREMIX)
5.0000 mg/h | INTRAVENOUS | Status: DC
  Administered 2021-02-24: 5 mg/h via INTRAVENOUS
  Administered 2021-02-25: 15 mg/h via INTRAVENOUS
  Administered 2021-02-25: 5 mg/h via INTRAVENOUS
  Administered 2021-02-25: 15 mg/h via INTRAVENOUS
  Filled 2021-02-24 (×6): qty 125

## 2021-02-24 MED ORDER — GUAIFENESIN-DM 100-10 MG/5ML PO SYRP
5.0000 mL | ORAL_SOLUTION | ORAL | Status: DC | PRN
  Administered 2021-02-24 – 2021-02-26 (×7): 5 mL via ORAL
  Filled 2021-02-24 (×7): qty 5

## 2021-02-24 MED ORDER — BENZONATATE 100 MG PO CAPS
100.0000 mg | ORAL_CAPSULE | Freq: Two times a day (BID) | ORAL | Status: DC | PRN
  Administered 2021-02-24 – 2021-02-25 (×2): 100 mg via ORAL
  Filled 2021-02-24 (×3): qty 1

## 2021-02-24 NOTE — Progress Notes (Signed)
Inpatient Rehab Admissions Coordinator:   Per therapy recommendations, patient was screened for CIR candidacy by Clemens Catholic, MS, CCC-SLP. At this time, Pt. does not appear to demonstrate medical necessity to justify in hospital rehabilitation/CIR. will not pursue a rehab consult for this Pt. Recommend other rehab venues to be pursued.  Please contact me with any questions.  Clemens Catholic, [place], [place] Admissions Coordinator  [phone] ([place]) [phone] (office)

## 2021-02-24 NOTE — Progress Notes (Addendum)
NAME:  Veronica Paul, MRN:  [id number], DOB:  [birth year], LOS: 2 ADMISSION DATE:  02/21/2021  Subjective  Patient evaluated at bedside this AM. States she is doing well, although continues to have significant cough. Daughter at bedside reports she was unable to tolerate PO medications this morning and had episode of emesis afterward.  Objective   Blood pressure 139/79, pulse (!) 110, temperature 98.1 F (36.7 C), temperature source Oral, resp. rate (!) 21, height 5\' 3"  (1.6 m), weight 76 kg, last menstrual period 05/11/1992, SpO2 97 %.     Intake/Output Summary (Last 24 hours) at 02/24/2021 1010 Last data filed at 02/24/2021 0533 Gross per 24 hour  Intake 636.63 ml  Output 500 ml  Net 136.63 ml   Filed Weights   02/23/21 0117 02/23/21 0452 02/24/21 0307  Weight: 76.4 kg 75.6 kg 76 kg   Physical Exam: General: Resting comfortably in bed, no acute distress CV: Tachycardic, irregular rhythm. No murmurs appreciated. DP pulses 2+ bilaterally. Pulm: Normal work of breathing on room air. Clear to ausculation bilaterally Abdomen: Soft, non-tender, non-distended. MSK: Normal bulk, tone. No pitting edema bilaterally. Neuro: Awake, alert, conversing at baseline w/ expressive aphasia.  Labs    CBC Latest Ref Rng & Units 02/23/2021 02/22/2021 02/21/2021  WBC 4.0 - 10.5 K/uL 7.5 8.4 8.8  Hemoglobin 12.0 - 15.0 g/dL 14.1 15.1(H) 14.2  Hematocrit 36.0 - 46.0 % 42.0 46.1(H) 44.6  Platelets 150 - 400 K/uL 289 276 303   BMP Latest Ref Rng & Units 02/24/2021 02/23/2021 02/23/2021  Glucose 70 - 99 mg/dL 106(H) 97 118(H)  BUN 8 - 23 mg/dL 8 10 7(L)  Creatinine 0.44 - 1.00 mg/dL 0.80 0.85 0.81  BUN/Creat Ratio 12 - 28 - - -  Sodium 135 - 145 mmol/L 141 141 139  Potassium 3.5 - 5.1 mmol/L 3.7 3.6 2.9(L)  Chloride 98 - 111 mmol/L 105 107 104  CO2 22 - 32 mmol/L 25 26 25   Calcium 8.9 - 10.3 mg/dL 9.0 9.0 8.5(L)   Summary  Veronica Paul is 86yo person with atrial fibrillation on Eliquis, hypertension,  hyperlipidemia, type II diabetes mellitus, depression, history of lung cancer s/p partial lobectomy admitted 1/6 with atrial fibrillation with RVR, COVID-19 infection, and dysphagia.  Assessment & Plan:  Principal Problem:   Atrial fibrillation with RVR (HCC) Active Problems:   COVID-19 virus infection   Dysphagia  #Atrial fibrillation with RVR Rate controlled yesterday on home dosages of diltiazem. Overnight, HR started to increase to 120's. This morning, HR sustained in 130-140's. Unfortunately she was unable to tolerate oral medications this morning. We have re-started diltiazem drip for further rate control. Once she is able to tolerate PO and is off the diltiazem gtt, can consider increasing dosages. Previously she was on extended release diltiazem 360mg  daily. This was switched to diltiazem 30mg  every 6 hours given her dysphagia. Possible this dose does not adequately control heart rate anymore. Will continue with home metoprolol. - Continue diltiazem gtt - Metoprolol 300mg  daily - Eliquis 5mg  twice daily  #Dysphagia Patient continues to endorse dysphagia. This has been ongoing for the past two months. She has been evaluated by GI in outpatient setting, thought to be more oropharyngeal with need for ENT referral. Given further examination by PT/OT/SLP, there was a concern for possible cerebral infarction causing these symptoms. MRI brain yesterday negative. She has not had esophagram or modified barium swallow study. Hopefully these will be completed within the next day. - Appreciate SLP assistance and  recommendations - Crush medications, DYS3 diet - Plan for Modified Barium swallow and esophagram  #COVID-19 infection Patient is doing well without supplemental oxygen. Continues to have productive cough and sore throat. Will plan to continue Robitussin and add another antitussive. No indication for steroids or antiviral, will continue with supportive care. - Robitussin every 6 hours -  Tessalon 100mg  twice daily - Airborne and contact precautions, can d/c 1/16  #Hypomagnesemia #Hypokalemia Plan to give Mg and K today. Likely due to poor po intake 2/2 dysphagia. - IV magnesium sulfate 2g - Potassium packet 17mEq twice daily - Repeat Mg, BMP in AM  Best practice:  DIET: DYS3 IVF: n/a DVT PPX: Eliquis BOWEL: Scheduled Miralax, Senokot CODE: FULL FAM COM: Discussed with daughter at bedside  Sanjuan Dame, MD Internal Medicine Resident PGY-2 PAGER: [phone] 02/24/2021 10:10 AM  If after hours (below), please contact on-call pager: [phone] 5PM-7AM Monday-Friday 1PM-7AM Saturday-Sunday

## 2021-02-25 ENCOUNTER — Inpatient Hospital Stay (HOSPITAL_COMMUNITY): Payer: Medicare HMO

## 2021-02-25 ENCOUNTER — Telehealth: Payer: Self-pay

## 2021-02-25 ENCOUNTER — Ambulatory Visit: Payer: Medicare HMO | Admitting: Nurse Practitioner

## 2021-02-25 DIAGNOSIS — I4891 Unspecified atrial fibrillation: Secondary | ICD-10-CM | POA: Diagnosis not present

## 2021-02-25 LAB — BASIC METABOLIC PANEL
Anion gap: 9 (ref 5–15)
BUN: 8 mg/dL (ref 8–23)
CO2: 25 mmol/L (ref 22–32)
Calcium: 8.7 mg/dL — ABNORMAL LOW (ref 8.9–10.3)
Chloride: 103 mmol/L (ref 98–111)
Creatinine, Ser: 0.74 mg/dL (ref 0.44–1.00)
GFR, Estimated: >60 mL/min (ref >60)
Glucose, Bld: 148 mg/dL — ABNORMAL HIGH (ref 70–99)
Potassium: 3.6 mmol/L (ref 3.5–5.1)
Sodium: 137 mmol/L (ref 135–145)

## 2021-02-25 LAB — URINE CULTURE

## 2021-02-25 LAB — FIBRINOGEN: Fibrinogen: 613 mg/dL — ABNORMAL HIGH (ref 210–475)

## 2021-02-25 LAB — C-REACTIVE PROTEIN: CRP: 2.5 mg/dL — ABNORMAL HIGH (ref <1.0)

## 2021-02-25 LAB — FERRITIN: Ferritin: 123 ng/mL (ref 11–307)

## 2021-02-25 LAB — D-DIMER, QUANTITATIVE: D-Dimer, Quant: 0.43 ug/mL-FEU (ref 0.00–0.50)

## 2021-02-25 LAB — MAGNESIUM: Magnesium: 1.9 mg/dL (ref 1.7–2.4)

## 2021-02-25 LAB — LACTATE DEHYDROGENASE: LDH: 176 U/L (ref 98–192)

## 2021-02-25 MED ORDER — METOPROLOL TARTRATE 25 MG/10 ML ORAL SUSPENSION
150.0000 mg | Freq: Two times a day (BID) | ORAL | Status: DC
  Administered 2021-02-25: 150 mg via ORAL
  Filled 2021-02-25 (×4): qty 60

## 2021-02-25 MED ORDER — METOPROLOL TARTRATE 25 MG/10 ML ORAL SUSPENSION
100.0000 mg | Freq: Two times a day (BID) | ORAL | Status: DC
Start: 2021-02-25 — End: 2021-02-25

## 2021-02-25 MED ORDER — ADULT MULTIVITAMIN W/MINERALS CH
1.0000 | ORAL_TABLET | Freq: Every day | ORAL | Status: DC
  Administered 2021-02-25 – 2021-02-27 (×3): 1 via ORAL
  Filled 2021-02-25 (×3): qty 1

## 2021-02-25 NOTE — Telephone Encounter (Signed)
[facility], Kim calling to check instructions for pt's diltiazem. Per recent mychart messages (02/18/21) clarification on dosage was made. Pt is taking diltiazem 30mg - 4 times daily. Pt is not taking cardizem LA per EMR.  Explained to Maudie Mercury that per chart pt is currently admitted so all medications are subject to change. Maudie Mercury states that prescription will be put on hold for pt. Kim verbalizes understanding.

## 2021-02-25 NOTE — Progress Notes (Signed)
Physical Therapy Treatment Patient Details Name: Veronica Paul MRN: [id number] DOB: 1935-03-04 Today's Date: 02/25/2021   History of Present Illness Veronica Paul is an 86 yo female admitted 02/21/21 with Afib with RVR, dehydration, dysphagia, fatigue, chills, and productive cough/sore throat. PMH includes Afib on Eliquis, HTN, HLD, diabetes, anxiety, depression, and hx of lung cancer    PT Comments    Pt was seen for attempted therapy out of the bed, but upon arrival was too tachycardic.  Values were in th the 125  b/min range, but took short rests as tHR elevated to 149 at one point.  Nurse in to adjust Cardizem, but the limit from MD of 130 b/min HR did not permit PT to continue.  Follow up with pt to progress gait and transfers as her vitals and general medical condition permit.  Recommendations for follow up therapy are one component of a multi-disciplinary discharge planning process, led by the attending physician.  Recommendations may be updated based on patient status, additional functional criteria and insurance authorization.  Follow Up Recommendations  Acute inpatient rehab (3hours/day)     Assistance Recommended at Discharge Frequent or constant Supervision/Assistance  Patient can return home with the following A little help with walking and/or transfers;A little help with bathing/dressing/bathroom;Assistance with cooking/housework;Assist for transportation;Help with stairs or ramp for entrance   Equipment Recommendations  Rolling walker (2 wheels)    Recommendations for Other Services       Precautions / Restrictions Precautions Precautions: Fall;Other (comment) Precaution Comments: expressive deficits, L sided deficits (awaiting MRI), HR Restrictions Weight Bearing Restrictions: No Other Position/Activity Restrictions: Monitor HR d/t tachycardia     Mobility  Bed Mobility Overal bed mobility: Needs Assistance             General bed mobility comments: stayed  in bed due to HR    Transfers                   General transfer comment: withheld due to HR    Ambulation/Gait                   Stairs             Wheelchair Mobility    Modified Rankin (Stroke Patients Only)       Balance                                            Cognition Arousal/Alertness: Awake/alert Behavior During Therapy: Flat affect Overall Cognitive Status: Impaired/Different from baseline Area of Impairment: Problem solving;Following commands;Attention                   Current Attention Level: Selective Memory: Decreased short-term memory Following Commands: Follows one step commands with increased time Safety/Judgement: Decreased awareness of deficits Awareness: Intellectual   General Comments: no aggressive therapy today due to her HR elevating during ex's and then could not get OOB        Exercises General Exercises - Lower Extremity Ankle Circles/Pumps: AAROM;5 reps Quad Sets: AROM;10 reps Heel Slides: AAROM;10 reps Hip ABduction/ADduction: AAROM;10 reps    General Comments        Pertinent Vitals/Pain Pain Assessment: No/denies pain Faces Pain Scale: No hurt Breathing: normal Negative Vocalization: none Facial Expression: smiling or inexpressive Body Language: relaxed Consolability: no need to console PAINAD Score: 0    Home  Living                          Prior Function            PT Goals (current goals can now be found in the care plan section) Acute Rehab PT Goals Patient Stated Goal: to be independent and live alone Progress towards PT goals: Not progressing toward goals - comment (HR was too elevated to persist with therapy)    Frequency    Min 4X/week      PT Plan Current plan remains appropriate    Co-evaluation              AM-PAC PT "6 Clicks" Mobility   Outcome Measure  Help needed turning from your back to your side while in a flat bed  without using bedrails?: A Little Help needed moving from lying on your back to sitting on the side of a flat bed without using bedrails?: A Little Help needed moving to and from a bed to a chair (including a wheelchair)?: A Little Help needed standing up from a chair using your arms (e.g., wheelchair or bedside chair)?: A Little Help needed to walk in hospital room?: A Lot Help needed climbing 3-5 steps with a railing? : Total 6 Click Score: 15    End of Session   Activity Tolerance: Treatment limited secondary to medical complications (Comment) Patient left: in bed;with call bell/phone within reach;with bed alarm set;with family/visitor present Nurse Communication: Mobility status PT Visit Diagnosis: Unsteadiness on feet (R26.81);Other abnormalities of gait and mobility (R26.89);Muscle weakness (generalized) (M62.81);Difficulty in walking, not elsewhere classified (R26.2);Other symptoms and signs involving the nervous system (V20.233)     Time: 4356-8616 PT Time Calculation (min) (ACUTE ONLY): 20 min  Charges:  $Therapeutic Exercise: 8-22 mins       Ramond Dial 02/25/2021, 5:03 PM Mee Hives, PT PhD Acute Rehab Dept. Number: [place] [address]

## 2021-02-25 NOTE — Plan of Care (Signed)
Patient continues to cough, meds given with little relief. Pt able to swallow pill once crushed in applesauce or given with a banana. Barium swallow test to be completed in AM, will continue to follow aspiration precautions.     Problem: Education: Goal: Knowledge of General Education information will improve Description: Including pain rating scale, medication(s)/side effects and non-pharmacologic comfort measures Outcome: Progressing   Problem: Health Behavior/Discharge Planning: Goal: Ability to manage health-related needs will improve Outcome: Progressing   Problem: Clinical Measurements: Goal: Ability to maintain clinical measurements within normal limits will improve Outcome: Progressing Goal: Will remain free from infection Outcome: Progressing Goal: Diagnostic test results will improve Outcome: Progressing Goal: Respiratory complications will improve Outcome: Progressing Goal: Cardiovascular complication will be avoided Outcome: Progressing   Problem: Activity: Goal: Risk for activity intolerance will decrease Outcome: Progressing   Problem: Nutrition: Goal: Adequate nutrition will be maintained Outcome: Progressing

## 2021-02-25 NOTE — Progress Notes (Signed)
° °   ° °  RE:   Gracelee Stemmler      Date of Birth:  [birth year]     Date:   02/25/2021       To Whom It May Concern:  Please be advised that the above-named patient will require a short-term nursing home stay - anticipated 30 days or less for rehabilitation and strengthening.  The plan is for return home.

## 2021-02-25 NOTE — TOC Initial Note (Addendum)
Transition of Care [facility]) - Initial/Assessment Note    Patient Details  Name: Veronica Paul MRN: [id number] Date of Birth: Aug 31, 1935  [facility] CM/SW Contact:    Bethann Berkshire, [place] Phone Number: 02/25/2021, 12:18 PM  Clinical Narrative:                  CSW called pt daughter Neoma Laming who was in room with pt. CSW spoke with daughter and pt while on speaker phone. CSW explained SNF recommendation. Daughter initially inquired about pt going home with PT; CSW explained [place] and DME process though family would not be able to provide 24/7 supervision. Pt lives in [place] and her 2 daughter's live 10 and 20 minutes away. Pt does not have any DME at home and has not been to SNF before. Pt and daughter are agreeable to SNF workup. CSW explained how to access medicare rating list on [email or web address].biz/. CSW explains insurance auth process. Pt is covid vaccinated x3. CSW will complete FL2 and fax bed requests in hub. TOC will continue to follow for medical readiness and to provide bed offers.   Expected Discharge Plan: Skilled Nursing Facility Barriers to Discharge: Continued Medical Work up, SNF Covid   Patient Goals and CMS Choice        Expected Discharge Plan and Services Expected Discharge Plan: [place] Acute Care Choice: [place] Living arrangements for the past 2 months: Single Family Home                                      Prior Living Arrangements/Services Living arrangements for the past 2 months: Single Family Home Lives with:: Self          Need for Family Participation in Patient Care: Yes (Comment) Care giver support system in place?: Yes (comment)      Activities of Daily Living   ADL Screening (condition at time of admission) Patient's cognitive ability adequate to safely complete daily activities?: Yes Is the patient deaf or have difficulty hearing?: No Does the patient have difficulty seeing, even when  wearing glasses/contacts?: No Does the patient have difficulty concentrating, remembering, or making decisions?: No Patient able to express need for assistance with ADLs?: Yes Does the patient have difficulty dressing or bathing?: Yes Does the patient have difficulty walking or climbing stairs?: Yes Weakness of Legs: Both Weakness of Arms/Hands: None  Permission Sought/Granted   Permission granted to share information with : Yes, Verbal Permission Granted  Share Information with NAME: [name] (Daughter)   [phone] (Mobile)           Emotional Assessment       Orientation: : Oriented to Self, Oriented to Place, Oriented to  Time, Oriented to Situation Alcohol / Substance Use: Not Applicable Psych Involvement: No (comment)  Admission diagnosis:  Dehydration [E86.0] Hypokalemia [E87.6] Atrial fibrillation with RVR ([place]) [I48.91] COVID-19 virus infection [U07.1] COVID-19 [U07.1] Patient Active Problem List   Diagnosis Date Noted   Dysphagia 02/24/2021   COVID-19 virus infection 02/22/2021   Positive serology for Helicobacter pylori 40/81/4481   Irritable bowel syndrome with both constipation and diarrhea 05/19/2019   Benign neoplasm of cecum    Benign neoplasm of ascending colon    Benign neoplasm of transverse colon    Benign neoplasm of descending colon    Benign neoplasm of sigmoid colon  Weight loss    Elevated coronary artery calcium score 03/15/2019   Atrial fibrillation with RVR ([place]) 08/17/2018   Hypokalemia 08/17/2018   Hypothyroidism 05/23/2013   Diabetes mellitus without complication ([place]) 20/60/1561   GAD (generalized anxiety disorder) 02/09/2013   Depression 06/25/2012   Peripheral edema 06/25/2012   Osteopenia of lumbar spine    Hypertension    Hyperlipidemia    BMI 34.0-34.9,adult    PCP:  Chevis Pretty, FNP Pharmacy:   [place], [place] [address] Phone: [phone]  Fax: [phone]  [id number] [place], [place] [address] Phone: [phone] Fax: [phone]     Social Determinants of Health (SDOH) Interventions    Readmission Risk Interventions No flowsheet data found.

## 2021-02-25 NOTE — Progress Notes (Addendum)
HD#3 SUBJECTIVE:  Patient Summary: Veronica Paul is a 86 y.o. with a pertinent PMH of Afib on Eliquis, HTN, HLD, diabetes, anxiety, depression, and hx of lung cancer who presented with concerns for Afib with RVR, dehydration, dysphagia, and sore throat and admitted for Afib with RVR, dysphagia, and COVID-19.   Overnight Events: No acute events overnight  Interim History: This is hospital day 3 for Veronica Paul who was seen and evaluated with her daughter, at the bedside. Her cough does not seem to be getting any better. The patient is still having difficulty swallowing her medications.   OBJECTIVE:  Vital Signs: Vitals:   02/25/21 0146 02/25/21 0300 02/25/21 0350 02/25/21 0400  BP:   124/81 130/84  Pulse:   84 (!) 109  Resp: 20 (!) 22 (!) 22 20  Temp:   99 F (37.2 C) 99 F (37.2 C)  TempSrc:   Oral   SpO2:   97% 97%  Weight:      Height:       Supplemental O2: Room Air SpO2: 97 %  Filed Weights   02/23/21 0452 02/24/21 0307 02/25/21 0022  Weight: 75.6 kg 76 kg 74.9 kg     Intake/Output Summary (Last 24 hours) at 02/25/2021 0558 Last data filed at 02/25/2021 0500 Gross per 24 hour  Intake 214.85 ml  Output 1175 ml  Net -960.15 ml   Net IO Since Admission: -361.21 mL [02/25/21 0558]  Physical Exam: General: In mild distress during coughing episodes CV: Afib with RVR. No murmurs.  Pulmonary: Lungs CTAB. Normal effort on room air.  Abdominal: Soft, nontender, nondistended.  Skin: Warm and dry.  Neuro: Awake and alert. Conversing with expressive aphasia, but appears to be at baseline.  Psych: Normal mood and affect    ASSESSMENT/PLAN:  Assessment: Principal Problem:   Atrial fibrillation with RVR (HCC) Active Problems:   COVID-19 virus infection   Dysphagia   Plan:  #Afib with RVR Patient was restarted on dilt drip yesterday, as she was unable to take po meds without having episodes of emesis. Previously was on ER dilt 360 mg daily, however, dose was  decreased to 30 mg q6h short acting, so the patient would be able to crush her medications. Will likely increase dose when patient able to take po. - Continue dilt gtt - Metoprolol 300 mg daily - Eliquis 5 mg bid  #Dysphagia Patient has been evaluated by GI in the outpatient setting for dysphagia that has been ongoing for 2 months, and they suspect dysphagia is pharyngo-esophageal/cricopharyngeal in nature, that could have been worsened by laryngeal edema from her upper respiratory infection. Plan for esophogram and modified barium swallow study today. If no structural etiology is identified, will need to have ongoing discussions about patient wishes regarding a feeding tube.  - Esophagram and modified barium - Dysphagia 3 diet - Appreciate SLP recs   #COVID-19 infection  Patient continues to have a productive cough, although denies any shortness of breath and she remains on room air. Patient has been unable to take tessalon in the setting of her dysphagia. - Continue Robitussin 16h - Airborne/contact precautions through 1/16  Best Practice: Diet: Dysphagia 3 IVF: Fluids: none VTE: Eliquis Code: Full AB: none Therapy Recs: SNF Family Contact: Daughter, at bedside. DISPO: Anticipated discharge to Skilled nursing facility pending Medical stability.  Signature: Buddy Duty, D.O.  Internal Medicine Resident, PGY-1 [facility]  Pager: [phone] 5:58 AM, 02/25/2021   Please contact the on  call pager after 5 pm and on weekends at [phone].

## 2021-02-25 NOTE — Progress Notes (Signed)
Modified Barium Swallow Progress Note  Patient Details  Name: Veronica Paul MRN: [id number] Date of Birth: [birth year]  Today's Date: 02/25/2021  Modified Barium Swallow completed.  Full report located under Chart Review in the Imaging Section.  Brief recommendations include the following:  Clinical Impression  Pt demonstrates generally adequate oropharyngeal function. Pt does exhibit a slightly prolonged oral phase with vallecular packing during mastication and slow oral transit, though not impaired or outside of functional limits for age. Pt reportedly did have some dysmotility on esophagram preceeding this test, so basic esophageal precautions are warranted - upright posture, staying upright after meals, following solids with liquids, continuing soft foods of choice (dys 3/thin). No SLP f/u needed otherwise. SLP will sign off.   Swallow Evaluation Recommendations       SLP Diet Recommendations: Dysphagia 3 (Mech soft) solids;Thin liquid   Liquid Administration via: Cup;Straw   Medication Administration: Crushed with puree   Supervision: Patient able to self feed   Compensations: Slow rate;Small sips/bites;Follow solids with liquid   Postural Changes: Seated upright at 90 degrees   Oral Care Recommendations: Oral care BID      Herbie Baltimore, MA CCC-SLP  Acute Rehabilitation Services Office [phone]   Lynann Beaver 02/25/2021,2:04 PM

## 2021-02-25 NOTE — NC FL2 (Signed)
[place] MEDICAID FL2 LEVEL OF CARE SCREENING TOOL     IDENTIFICATION  Patient Name: Veronica Paul Birthdate: 1935/10/06 Sex: female Admission Date (Current Location): 02/21/2021  [facility] [place] Number:  Herbalist and Address:  [facility], [place] [address], [place], [place] [address]      Provider Number: [id number]  Attending Physician Name and Address:  Axel Filler, *  Relative Name and Phone Number:  [name] (Daughter)   [phone] [facility])    Current Level of Care: Hospital Recommended Level of Care: [place] Prior Approval Number:    Date Approved/Denied:   PASRR Number: Pending  Discharge Plan: SNF    Current Diagnoses: Patient Active Problem List   Diagnosis Date Noted   Dysphagia 02/24/2021   COVID-19 virus infection 02/22/2021   Positive serology for Helicobacter pylori 47/42/5956   Irritable bowel syndrome with both constipation and diarrhea 05/19/2019   Benign neoplasm of cecum    Benign neoplasm of ascending colon    Benign neoplasm of transverse colon    Benign neoplasm of descending colon    Benign neoplasm of sigmoid colon    Weight loss    Elevated coronary artery calcium score 03/15/2019   Atrial fibrillation with RVR ([place]) 08/17/2018   Hypokalemia 08/17/2018   Hypothyroidism 05/23/2013   Diabetes mellitus without complication ([place]) 38/75/6433   GAD (generalized anxiety disorder) 02/09/2013   Depression 06/25/2012   Peripheral edema 06/25/2012   Osteopenia of lumbar spine    Hypertension    Hyperlipidemia    BMI 34.0-34.9,adult     Orientation RESPIRATION BLADDER Height & Weight     Self, Time, Situation, Place  Normal External catheter Weight: 165 lb 3.2 oz (74.9 kg) Height:  5\' 3"  (160 cm)  BEHAVIORAL SYMPTOMS/MOOD NEUROLOGICAL BOWEL NUTRITION STATUS      Continent Diet (See d/c summary)  AMBULATORY STATUS COMMUNICATION OF NEEDS Skin   Extensive Assist  Verbally Normal                       Personal Care Assistance Level of Assistance  Bathing, Dressing, Feeding Bathing Assistance: Maximum assistance Feeding assistance: Limited assistance Dressing Assistance: Maximum assistance     Functional Limitations Info  Sight, Hearing, Speech Sight Info: Impaired Hearing Info: Adequate Speech Info: Impaired    SPECIAL CARE FACTORS FREQUENCY  PT (By licensed PT), OT (By licensed OT)     PT Frequency: 5x/week OT Frequency: 5x/week            Contractures Contractures Info: Not present    Additional Factors Info  Code Status, Allergies Code Status Info: Full code Allergies Info: Feldene (piroxicam), ace inhibitors, lexapro (escitalopram), Prevnar (pneumococcal 13-val Conj Vacc), meloxicam, Zithromax (azithromycin Dihydrate), statins, sulfa antibiotics           Current Medications (02/25/2021):  This is the current hospital active medication list Current Facility-Administered Medications  Medication Dose Route Frequency Provider Last Rate Last Admin   0.9 %  sodium chloride infusion   Intravenous PRN Angelica Pou, MD   Stopped at 02/23/21 1318   acetaminophen (TYLENOL) tablet 650 mg  650 mg Oral Q6H PRN Sanjuan Dame, MD   650 mg at 02/25/21 0603   Or   acetaminophen (TYLENOL) suppository 650 mg  650 mg Rectal Q6H PRN Sanjuan Dame, MD       albuterol (VENTOLIN HFA) 108 (90 Base) MCG/ACT inhaler 2 puff  2 puff Inhalation Q6H  PRN Sanjuan Dame, MD       apixaban Arne [place]) tablet 5 mg  5 mg Oral BID Sanjuan Dame, MD   5 mg at 02/25/21 0950   diltiazem (CARDIZEM) 125 mg in dextrose 5% 125 mL (1 mg/mL) infusion  5-15 mg/hr Intravenous Continuous Sanjuan Dame, MD 10 mL/hr at 02/25/21 0700 10 mg/hr at 02/25/21 0700   feeding supplement (ENSURE ENLIVE / ENSURE PLUS) liquid 237 mL  237 mL Oral TID BM Angelica Pou, MD   237 mL at 02/25/21 0952   guaiFENesin-dextromethorphan (ROBITUSSIN DM) 100-10  MG/5ML syrup 5 mL  5 mL Oral Q4H PRN Angelica Pou, MD   5 mL at 02/25/21 1043   levothyroxine (SYNTHROID) tablet 75 mcg  75 mcg Oral Q0600 Sanjuan Dame, MD   75 mcg at 02/25/21 0603   MEDLINE mouth rinse  15 mL Mouth Rinse BID Angelica Pou, MD   15 mL at 02/25/21 6237   metoprolol succinate (TOPROL-XL) 24 hr tablet 300 mg  300 mg Oral Daily Sanjuan Dame, MD   300 mg at 02/24/21 0817   phenol (CHLORASEPTIC) mouth spray 1 spray  1 spray Mouth/Throat PRN Sanjuan Dame, MD   1 spray at 02/25/21 0000   polyethylene glycol (MIRALAX / GLYCOLAX) packet 17 g  17 g Oral Daily Sanjuan Dame, MD   17 g at 02/24/21 6283   senna-docusate (Senokot-S) tablet 2 tablet  2 tablet Oral BID Sanjuan Dame, MD   2 tablet at 02/24/21 2150   sertraline (ZOLOFT) tablet 50 mg  50 mg Oral Daily Sanjuan Dame, MD   50 mg at 02/25/21 1517     Discharge Medications: Please see discharge summary for a list of discharge medications.  Relevant Imaging Results:  Relevant Lab Results:   Additional Information SSN [id number] Pt is covid vaccinated x3  [place], LCSW

## 2021-02-25 NOTE — Progress Notes (Addendum)
Nutrition Follow-up  DOCUMENTATION CODES:   Not applicable  INTERVENTION:  -Encourage adequate PO intake -Continue Ensure Enlive po TID, each supplement provides 350 kcal and 20 grams of protein (strawberry and chocolate) -Snacks daily (pudding, fruit cup) - MVI with minerals daily  NUTRITION DIAGNOSIS:   Inadequate oral intake related to dysphagia as evidenced by meal completion < 50%.  On going; dysphagia still present, PO intake improving 50-75% meal completions  GOAL:   Patient will meet greater than or equal to 90% of their needs  Progressing; addressing needs through mealtime intake and nutritional supplements  MONITOR:   PO intake, Supplement acceptance, Diet advancement, Skin, Weight trends, Labs, I & O's  REASON FOR ASSESSMENT:   Consult Assessment of nutrition requirement/status  ASSESSMENT:   86 yo female with Afib on Eliquis, HTN, HLD, diabetes, anxiety, depression, and hx of lung cancer who presented for Afib with RVR, dehydration, dysphagia, and cough/sore throat. Pt COVID+.  Pt's daughter present at bedside and provided most history as pt receiving new IV placement during visit. She states that pt's appetite is improving. She is finding foods that she is able to swallow without difficulty such as eggs, pudding, bananas, potatoes, and mac and cheese. She is currently drinking about 75% of 2 Ensure daily. Pt is having difficulty with chopped meats and swallowing medications unless given with applesauce. She had a MBS today. Per SLP, pt with slow oral transit, though not impaired or outside of functional limits for age. Pt clear to continue with dysphagia 3, thin liquid diet.   Admit weight: 75.3 kg (166 lbs) Current weight: 74.9 kg (165 lbs)  Medications: synthroid, miralax, senna  Labs: reviewed  NUTRITION - FOCUSED PHYSICAL EXAM:  Flowsheet Row Most Recent Value  Orbital Region Mild depletion  Upper Arm Region No depletion  Thoracic and Lumbar Region  No depletion  Buccal Region No depletion  Temple Region Mild depletion  Clavicle Bone Region No depletion  Clavicle and Acromion Bone Region No depletion  Scapular Bone Region No depletion  Dorsal Hand No depletion  Patellar Region Mild depletion  Anterior Thigh Region Mild depletion  Posterior Calf Region Mild depletion  Edema (RD Assessment) Mild  Hair Reviewed  Eyes Other (Comment)  [black spot-both inner eyes]  Mouth Reviewed  Skin Reviewed  Nails Reviewed       Diet Order:   Diet Order             DIET DYS 3 Room service appropriate? Yes; Fluid consistency: Thin  Diet effective now                   EDUCATION NEEDS:   Not appropriate for education at this time  Skin:  Skin Assessment: Reviewed RN Assessment  Last BM:  02/25/21  Height:   Ht Readings from Last 1 Encounters:  02/23/21 _0  (1.6 m)    Weight:   Wt Readings from Last 1 Encounters:  02/25/21 74.9 kg    BMI:  Body mass index is 29.26 kg/m.  Estimated Nutritional Needs:   Kcal:  1700-1900  Protein:  80-90 grams  Fluid:  >/= 1.7 L/day  Clayborne Dana, RDN, LDN Clinical Nutrition

## 2021-02-26 DIAGNOSIS — I4891 Unspecified atrial fibrillation: Secondary | ICD-10-CM | POA: Diagnosis not present

## 2021-02-26 MED ORDER — METOPROLOL TARTRATE 50 MG PO TABS
150.0000 mg | ORAL_TABLET | Freq: Two times a day (BID) | ORAL | Status: DC
  Administered 2021-02-26 – 2021-02-27 (×3): 150 mg via ORAL
  Filled 2021-02-26 (×4): qty 3

## 2021-02-26 MED ORDER — GUAIFENESIN-CODEINE 100-10 MG/5ML PO SOLN
10.0000 mL | ORAL | Status: DC | PRN
  Administered 2021-02-27 (×2): 10 mL via ORAL
  Filled 2021-02-26 (×3): qty 10

## 2021-02-26 MED ORDER — DILTIAZEM HCL 30 MG PO TABS
30.0000 mg | ORAL_TABLET | Freq: Four times a day (QID) | ORAL | Status: DC
  Administered 2021-02-26 – 2021-02-27 (×4): 30 mg via ORAL
  Filled 2021-02-26 (×4): qty 1

## 2021-02-26 NOTE — TOC Progression Note (Signed)
Transition of Care [facility]) - Progression Note    Patient Details  Name: ENGLISH TOMER MRN: [id number] Date of Birth: Jul 01, 1935  Transition of Care [facility]) CM/SW Contact  Zenon Mayo, RN Phone Number: 02/26/2021, 3:30 PM  Clinical Narrative:    Covid, dehydration, home alone, daughter stays 20 mins away, caredizem drip will be changed to po today, on day 4 of Covid, had swallowing eval yesterday, pt rec SNF right now, will have [facility] program to work with her while waiting for Covid to reach day 10 to see is she will improve enough to go home with [facility]. TOC will continue to follow for dc needs.    Expected Discharge Plan: [place] Barriers to Discharge: Continued Medical Work up, SNF Covid  Expected Discharge Plan and Services Expected Discharge Plan: [place] Choice: [place] arrangements for the past 2 months: Single Family Home                                       Social Determinants of Health (SDOH) Interventions    Readmission Risk Interventions No flowsheet data found.

## 2021-02-26 NOTE — Progress Notes (Signed)
Pt ambulated in room with walker back an forth tot he door x2. Tolerated well.

## 2021-02-26 NOTE — Consult Note (Signed)
° °  [facility] Inpatient Consult   02/26/2021  Veronica Paul Aug 07, 1935 [id number]  [place] [facility] [ACO] Patient: Veronica Paul Medicare  Primary Care Provider:  Chevis Pretty, FNP, [place] is an embedded provider with a Chronic Care Management team and program, and is listed for the transition of care follow up and appointments. Spoke with inpatient [facility] RNCM regarding patient's recommendations for SNF verses home with home health to progress with the [facility] program.  *Patient on COVID positive airborne precautions  Patient was screened for Embedded practice service needs for chronic care management and reviewed for post hospital follow up needs.  Plan: A referral can be sent to the [place] Management.  Will follow progress.  Please contact for further questions,  Natividad Brood, RN BSN [facility] Liaison  [phone] business mobile phone Toll free office [phone]  Fax number: [phone] [email or web address]

## 2021-02-26 NOTE — Plan of Care (Signed)
  Problem: Clinical Measurements: Goal: Respiratory complications will improve Outcome: Progressing Goal: Cardiovascular complication will be avoided Outcome: Progressing   Problem: Pain Managment: Goal: General experience of comfort will improve Outcome: Progressing   Problem: Safety: Goal: Ability to remain free from injury will improve Outcome: Progressing   

## 2021-02-26 NOTE — TOC Progression Note (Signed)
Transition of Care [facility]) - Progression Note    Patient Details  Name: Veronica Paul MRN: [id number] Date of Birth: 07-12-1935  Transition of Care [facility]) CM/SW [place], [place] Phone Number: 02/26/2021, 10:05 AM  Clinical Narrative:     PASSR# Received; [id number] E expires on 03/27/2021   Expected Discharge Plan: [place] Barriers to Discharge: Continued Medical Work up, SNF Covid  Expected Discharge Plan and Services Expected Discharge Plan: [place] Choice: [place] arrangements for the past 2 months: Single Family Home                                       Social Determinants of Health (SDOH) Interventions    Readmission Risk Interventions No flowsheet data found.

## 2021-02-26 NOTE — TOC Progression Note (Signed)
Transition of Care [facility]) - Progression Note    Patient Details  Name: Veronica Paul MRN: [id number] Date of Birth: Jun 24, 1935  Transition of Care [facility]) CM/SW [place], [place] Phone Number: 02/26/2021, 4:38 PM  Clinical Narrative:    CSW contacted pt and spoke with pt daughter to go over SNF options. Pt daughter asked CSW to check with [facility] and [facility] for placement. CSW left messages with both.   Expected Discharge Plan: [place] Barriers to Discharge: Continued Medical Work up, SNF Covid  Expected Discharge Plan and Services Expected Discharge Plan: [place] Choice: [place] arrangements for the past 2 months: Single Family Home                                       Social Determinants of Health (SDOH) Interventions    Readmission Risk Interventions No flowsheet data found.

## 2021-02-26 NOTE — Progress Notes (Signed)
PT Cancellation Note  Patient Details Name: Veronica Paul MRN: [id number] DOB: [birth year]   Cancelled Treatment:    Reason Eval/Treat Not Completed: Other (comment).  Pt was attempted but was tired from walking with nursing and will retry as time and pt allow.   Ramond Dial 02/26/2021, 3:45 PM  Mee Hives, PT PhD Acute Rehab Dept. Number: [place]

## 2021-02-26 NOTE — Evaluation (Signed)
Speech Language Pathology Evaluation Patient Details Name: Veronica Paul MRN: [id number] DOB: 1935/04/29 Today's Date: 02/26/2021 Time: 7829-5621 SLP Time Calculation (min) (ACUTE ONLY): 42 min  Problem List:  Patient Active Problem List   Diagnosis Date Noted   Dysphagia 02/24/2021   COVID-19 virus infection 02/22/2021   Positive serology for Helicobacter pylori 30/86/5784   Irritable bowel syndrome with both constipation and diarrhea 05/19/2019   Benign neoplasm of cecum    Benign neoplasm of ascending colon    Benign neoplasm of transverse colon    Benign neoplasm of descending colon    Benign neoplasm of sigmoid colon    Weight loss    Elevated coronary artery calcium score 03/15/2019   Atrial fibrillation with RVR ([place]) 08/17/2018   Hypokalemia 08/17/2018   Hypothyroidism 05/23/2013   Diabetes mellitus without complication ([place]) 69/62/9528   GAD (generalized anxiety disorder) 02/09/2013   Depression 06/25/2012   Peripheral edema 06/25/2012   Osteopenia of lumbar spine    Hypertension    Hyperlipidemia    BMI 34.0-34.9,adult    Past Medical History:  Past Medical History:  Diagnosis Date   Allergy    "my nose runs alot"   Anxiety    Arthritis    Atrial fibrillation ([place])    Blood transfusion without reported diagnosis    "when I had a miscarrage in 1957"   Cataract    bilateral removed   Cholelithiasis    Constipation    x ray showed increased stool in colon- uses Miralax daily and Dulcolax twice a week    Depression    Diabetes mellitus    Dyslipidemia    HTN (hypertension)    Hx of long-term (current) use of anticoagulants    Hyperlipidemia    Hypertension    Hypothyroidism    Lung cancer ([place])    Obesity    Osteopenia    Vitamin D deficiency    Past Surgical History:  Past Surgical History:  Procedure Laterality Date   CATARACT EXTRACTION Bilateral    COLONOSCOPY     COLONOSCOPY WITH PROPOFOL N/A 05/06/2019   Procedure: COLONOSCOPY WITH  PROPOFOL;  Surgeon: Jerene Bears, MD;  Location: [facility];  Service: Gastroenterology;  Laterality: N/A;   LUNG REMOVAL, PARTIAL  1998   Right   POLYPECTOMY  05/06/2019   Procedure: POLYPECTOMY;  Surgeon: Jerene Bears, MD;  Location: [facility];  Service: Gastroenterology;;   THYROIDECTOMY     TUBAL LIGATION     UPPER GASTROINTESTINAL ENDOSCOPY     HPI:  Veronica Paul is an 86 yo female with Afib on Eliquis, HTN, HLD, diabetes, anxiety, depression, and hx of lung cancer who presented to the hospital from her gastroenterologist's office with concerns for Afib with RVR, dehydration, dysphagia, and cough/sore throat..  Was to see GI for dysphagia.  Dysphagia to pills and throat feeling like "closing up"  cxr Stable postoperative chest without definite acute findings. H/o lung cancer.  Pt's daughter Veronica Paul present and reports pt with dysphagia - sounds oral - difficulty transiting food into pharynx - since early November.  Pt with weight loss approx 20 pounds since fall 2022.  Pt with pain with partials and they have been adjusted several times.  Speech and swallow eval ordered.   Assessment / Plan / Recommendation Clinical Impression  Pt scored 18/30 on the SLUMS, suggestive of cognitive impairment (a score of 27 and above would be WNL). Throughout testing she had difficulty wtih storage of new information, verbal  problem solving, and sequencing/organization for clock drawing. She had limited insight into deficits and her processing was at times quite delayed. Her daughter says that she and other family provide cognitive assistance already at home, and describes a gradual change in mentation over the last 2.5 years, which has been exacerbated during this acute illness. Recommend 24/7 supervision for safety and SLP f/u at next level of care to assess for potential cognitive recovery as she recovers from acute illness.    SLP Assessment  SLP Recommendation/Assessment: All further Speech Lanaguage  Pathology  needs can be addressed in the next venue of care SLP Visit Diagnosis: Cognitive communication deficit (R41.841)    Recommendations for follow up therapy are one component of a multi-disciplinary discharge planning process, led by the attending physician.  Recommendations may be updated based on patient status, additional functional criteria and insurance authorization.    Follow Up Recommendations  Skilled nursing-short term rehab (<3 hours/day)    Assistance Recommended at Discharge  Frequent or constant Supervision/Assistance  Functional Status Assessment    Frequency and Duration           SLP Evaluation Cognition  Overall Cognitive Status: Impaired/Different from baseline Arousal/Alertness: Awake/alert Orientation Level: Oriented X4 Attention: Selective Selective Attention: Impaired Selective Attention Impairment: Verbal basic;Functional basic Memory: Impaired Memory Impairment: Storage deficit;Decreased recall of new information Awareness: Impaired Awareness Impairment: Emergent impairment Problem Solving: Impaired Problem Solving Impairment: Verbal basic;Verbal complex Safety/Judgment: Impaired       Comprehension  Auditory Comprehension Overall Auditory Comprehension: Impaired Commands: Within Functional Limits (follows commands but with delay) Conversation: Simple Interfering Components: Processing speed;Motor planning EffectiveTechniques: Repetition;Slowed speech;Stressing words    Expression Expression Primary Mode of Expression: Verbal Verbal Expression Overall Verbal Expression: Impaired (impaired divergent naming, suspect related to cognition)   Oral / Motor  Motor Speech Overall Motor Speech: Appears within functional limits for tasks assessed            Osie Bond., M.A. [place] [facility] Services Pager [phone] Office [phone]  02/26/2021, 1:08 PM

## 2021-02-26 NOTE — Progress Notes (Addendum)
HD#4 SUBJECTIVE:  Patient Summary: Veronica Paul is a 86 y.o. with a pertinent PMH of Afib on Eliquis, HTN, HLD, diabetes, anxiety, depression, and hx of lung cancer who presented with concerns for Afib with RVR, dehydration, dysphagia, and sore throat and admitted for Afib with RVR, dysphagia 2/2 GE junction narrowing, and COVID-19.   Overnight Events: HR in the 120-130s overnight. Metoprolol succinate unable to be crushed, thus was switched to liquid metoprolol tartrate formulation  Interim History: This is hospital day 4 for Veronica Paul who was seen and evaluated at the bedside this morning, with her daughter present. Daughter has noticed that her cough has significantly improved. She has been able to eat some of her food, however, not much.   OBJECTIVE:  Vital Signs: Vitals:   02/25/21 2340 02/26/21 0000 02/26/21 0300 02/26/21 0500  BP:  126/72  130/67  Pulse:  100    Resp:  20 (!) 22 (!) 22  Temp: 98.3 F (36.8 C)   98.7 F (37.1 C)  TempSrc: Oral   Oral  SpO2:  97% 97% 97%  Weight:    75.8 kg  Height:       Supplemental O2: Room Air SpO2: 97 %  Filed Weights   02/24/21 0307 02/25/21 0022 02/26/21 0500  Weight: 76 kg 74.9 kg 75.8 kg     Intake/Output Summary (Last 24 hours) at 02/26/2021 0548 Last data filed at 02/26/2021 0538 Gross per 24 hour  Intake 1190.84 ml  Output 650 ml  Net 540.84 ml   Net IO Since Admission: 179.63 mL [02/26/21 0548]  Physical Exam: General: Resting comfortably in bed, no acute distress. CV: Regular rate, irregular rhythm. No murmurs.  Pulmonary: Lungs CTAB. Normal effort. Abdominal: Soft, nontender, nondistended. Skin: Warm and dry.  Neuro: Awake and alert, although appears fatigued. Able to converse with expressive aphasia      ASSESSMENT/PLAN:  Assessment: Principal Problem:   Atrial fibrillation with RVR (HCC) Active Problems:   COVID-19 virus infection   Dysphagia   Plan: #Afib with RVR Patient remains on dilt  drip and had her metoprolol succinate switched to metoprolol tartrate, as the succinate formulation is unable to be crushed, as it is time released. HR in the 120-130s overnight, and improved to the 80s this morning. Will transition off dilt drip and discontinue this today, and will transition back to oral, short acting diltiazem. We will also continue short acting metoprolol tartrate. Both of these medications are able to be crushed in the setting of this patient's dysphagia.  - Wean off dilt drip - Start cardizem 30 mg q6h, may need to increase dose - Continue metoprolol tartrate 150 mg bid  #Dysphagia 2/2 narrowing at gastroesophageal junction  Patient had modified barium swallow yesterday that showed slightly prolonged oral phase with slow oral transit. Patient also had esophogram which showed dysmotility and mild narrowing at the GE junction, although suspect that most of this patient's symptoms are related to declining functional status. The patient's daughter has noted that at recent dental visits, staff have commented that she has food retained in her mouth, which has led to many cavities recently.  - Treatment is supportive - Dysphagia 3 diet - Will offer outpatient palliative care services as I suspect these swallow issues will progress due to dementia  #COVID-19 infection Patient continues to have a cough, although this is markedly improved today. She remains on room air and is saturating well.   #Dementia #Deconditioning Patient was recommended to go  to SNF by PT, however, suspect that with this patient's dementia, and hospital delirium, that she would likely do better at home. Her two daughters have been assisting the patient with much of her care recently, and will work to see if they can provide 24/7 assistance, as she would likely do better at home in the long-term.   Best Practice: Diet: Dysphagia 3 IVF: Fluids: none VTE: Eliquis Code: Full AB: none Therapy Recs: SNF vs home  health PT Family Contact: Daughter, at bedside. DISPO: Anticipated discharge to Home vs SNF pending Medical stability.  Signature: Buddy Duty, D.O.  Internal Medicine Resident, PGY-1 [facility]  Pager: [phone] 5:48 AM, 02/26/2021   Please contact the on call pager after 5 pm and on weekends at [phone].

## 2021-02-26 NOTE — Care Management Important Message (Signed)
Important Message  Patient Details  Name: Veronica Paul MRN: [id number] Date of Birth: 11-08-1935   Medicare Important Message Given:  Yes     Shelda Altes 02/26/2021, 7:42 AM

## 2021-02-27 ENCOUNTER — Other Ambulatory Visit (HOSPITAL_COMMUNITY): Payer: Self-pay

## 2021-02-27 DIAGNOSIS — R1310 Dysphagia, unspecified: Secondary | ICD-10-CM | POA: Diagnosis not present

## 2021-02-27 DIAGNOSIS — I4891 Unspecified atrial fibrillation: Secondary | ICD-10-CM | POA: Diagnosis not present

## 2021-02-27 MED ORDER — DILTIAZEM HCL 60 MG PO TABS
60.0000 mg | ORAL_TABLET | Freq: Four times a day (QID) | ORAL | Status: DC
  Administered 2021-02-27: 60 mg via ORAL
  Filled 2021-02-27: qty 1

## 2021-02-27 MED ORDER — METOPROLOL TARTRATE 100 MG PO TABS
150.0000 mg | ORAL_TABLET | Freq: Two times a day (BID) | ORAL | 0 refills | Status: DC
Start: 2021-02-27 — End: 2021-03-22
  Filled 2021-02-27: qty 90, 30d supply, fill #0

## 2021-02-27 MED ORDER — ENSURE ENLIVE PO LIQD
237.0000 mL | Freq: Three times a day (TID) | ORAL | 12 refills | Status: AC
  Filled 2021-02-27 – 2021-04-09 (×2): qty 237, 1d supply, fill #0

## 2021-02-27 MED ORDER — DILTIAZEM HCL 60 MG PO TABS
60.0000 mg | ORAL_TABLET | Freq: Four times a day (QID) | ORAL | 0 refills | Status: DC
Start: 2021-02-27 — End: 2021-03-27
  Filled 2021-02-27: qty 120, 30d supply, fill #0

## 2021-02-27 NOTE — Discharge Instructions (Signed)
Dear Veronica Paul,  You were hospitalized for Afib and your difficulty swallowing. Your swallowing difficulties are likely related to dementia, and we have arranged for home health physical therapy, occupational therapy, and speech therapy to work with you on discharge, since you are not going to a skilled nursing facility.   For your Afib, you were started on Cardizem 60 mg to be taken every 6 hours, and metoprolol 150 mg to be taken twice daily. Both of these medications, in addition to your eliqius, can be crushed. Follow up with your cardiologist regarding your Afib to see how you are doing with these medications.  Please also arrange to see your primary care physician in ~1-2 weeks for a hospital follow up. I am glad you are feeling better!

## 2021-02-27 NOTE — Discharge Summary (Signed)
° °Name: Veronica Paul °MRN: [id number] °DOB: 05/22/1935 86 y.o. °PCP: Martin, Mary-Margaret, FNP ° °Date of Admission: 02/21/2021  4:53 PM °Date of Discharge:  02/27/2021 °Attending Physician: Dr. 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 °Dysphagia from dementia and narrowing at GE junction °Hypokalemia °Dehydration °Type 2 diabetes °Anxiety/depression °Hypothyroidism  ° °DISCHARGE MEDICATIONS:  ° °Allergies as of 02/27/2021   ° °   Reactions  ° Ace Inhibitors Cough  ° Feldene [piroxicam]   ° REACTION: RASH TO HANDS  ° Lexapro [escitalopram]   ° Meloxicam Nausea And Vomiting  ° Prevnar [pneumococcal 13-val Conj Vacc]   ° Statins Other (See Comments)  ° myalgia  ° Sulfa Antibiotics   ° Rash  ° Zithromax [azithromycin Dihydrate] Itching  ° °  ° °  °Medication List  °  ° °STOP taking these medications   ° °doxycycline 100 MG tablet °Commonly known as: VIBRA-TABS °  °metFORMIN 500 MG tablet °Commonly known as: GLUCOPHAGE °  °metoprolol 200 MG 24 hr tablet °Commonly known as: TOPROL-XL °  ° °  ° °TAKE these medications   ° °acetaminophen 500 MG tablet °Commonly known as: TYLENOL °Take 500 mg by mouth every 6 (six) hours as needed for moderate pain, headache or fever. °  °apixaban 5 MG Tabs tablet °Commonly known as: Eliquis °Take 1 tablet (5 mg total) by mouth 2 (two) times daily. °  °bisacodyl 5 MG EC tablet °Commonly known as: DULCOLAX °Take 5 mg by mouth daily as needed for moderate constipation (usually twice per week). °  °diltiazem 60 MG tablet °Commonly known as: CARDIZEM °Take 1 tablet (60 mg total) by mouth every 6 (six) hours. °What changed:  °medication strength °how much to take °when to take this °additional instructions °  °feeding supplement Liqd °Take 237 mLs by  mouth 3 (three) times daily between meals. °  °furosemide 20 MG tablet °Commonly known as: LASIX °Take 1 tablet (20 mg total) by mouth daily. °  °HYDROcodone bit-homatropine 5-1.5 MG/5ML syrup °Commonly known as: HYCODAN °Take 5 mLs by mouth every 6 (six) hours as needed for cough. °  °levothyroxine 75 MCG tablet °Commonly known as: SYNTHROID °Take 1 tablet (75 mcg total) by mouth daily. °  °Metoprolol Tartrate 75 MG Tabs °Take 150 mg by mouth 2 (two) times daily. °  °nitroGLYCERIN 0.4 MG SL tablet °Commonly known as: NITROSTAT °Place 1 tablet (0.4 mg total) under the tongue every 5 (five) minutes as needed for chest pain. °  °OneTouch Delica Plus Lancet33G Misc °USE TO CHECK BLOOD SUGARS DAILY DX E11.9 °  °OneTouch Delica Plus Lancing Misc °1 Device by Does not apply route daily. °  °OneTouch Verio test strip °Generic drug: glucose blood °CHECK BLOOD SUGARS DAILY DX E11.9 °  °OneTouch Verio w/Device Kit °Check blood sugars daily Dx E11.9 °  °polyethylene glycol 17 g packet °Commonly known as: MIRALAX / GLYCOLAX °Take 17 g by mouth daily as needed for mild constipation. °  °sertraline 50 MG tablet °Commonly known as: ZOLOFT °TAKE 1 TABLET BY MOUTH EVERY DAY °  ° °  ° ° °DISPOSITION AND FOLLOW-UP:  °Ms.Danila M Hinsch [facility]. At the hospital follow up visit please address: ° °Dysphagia/dementia: suspect dysphagia is secondary   secondary to dementia/clinical decline, as it is mostly oropharyngeal in nature and has been ongoing (no structural abnormality). Continue to crush medications and encourage po intake- would not recommend feeding tube for this patient Recommend outpatient palliative Set up home health PT/OT/SLP Afib with RVR: started on cardizem 60 mg q6 and metoprolol tartrate 150 mg bid (all can be crushed) COVID: assess how patient is symptomatically feeling, only treated supportively Diabetes: A1c well controlled in October, likely little benefit to  metformin at this time (discontinued it)  Follow-up Recommendations: Consults: palliative medicine (outpatient) and caridology Labs:  none Studies: none Medications: cardizem 60 mg q6, metoprolol tartrate 150 mg bid (can be crushed)  STOP metformin   Follow-up Appointments:  Follow-up Information     Chevis Pretty, FNP. Schedule an appointment as soon as possible for a visit in 2 week(s).   Specialty: Family Medicine Why: hospital follow up Contact information: [place] [address] [phone]         Minus Breeding, MD .   Specialty: Cardiology Contact information: [address] [place] [address] [phone]                 HOSPITAL COURSE:  Patient Summary: #Afib with RVR Patient was in ED waiting room for about 20 hours and did not receive any of her home medications at this time. Upon initial evaluation, she was in Afib with RVR, with HR in the 140s. Patient was started on a dilt drip at this time. Tachycardia could be secondary to missed dilt doses vs dehydration.TSH within normal limits. Patient was able to be weaned off of the dilt drip and was transitioned back to po cardizen 30 mq q6 and metoprolol succinate 300 mg daily, however, had to have the dilt drip restarted in the setting of her dysphagia and poor ability to keep her medications down. Dilt drip turned off on 1/10 and patient was transitioned back to cardizem 30 mg q6 and was also transitioned from metoprolol succinate to tartrate, 150 mg bid, as both of these medications can be crushed. Increased cardizem to 60 mg q6 on discharge and continued metoprolol tartrate 150 mg bid and eliquis 5 mg bid. Recommend close follow up with cardiologist and PCP.   #Dysphagia 2/2 narrowing at gastroesophageal junction  Patient endorsing dysphagia that has been ongoing for about 2 months. She has been evaluated by GI in the outpatient setting and it was thought that this  issue was more oropharyngeal in nature. Patient had modified barium swallow on 1/9 that showed slightly prolonged oral phase with slow oral transit. Patient also had esophogram which showed dysmotility and mild narrowing at the GE junction, although suspect that most of this patient's symptoms are related to declining functional status. The patient's daughter has noted that at recent dental visits, staff have commented that she has food retained in her mouth, which has led to many cavities recently. Recommend outpatient palliative care services, as I suspect these swallow issues will progress due to dementia. Started and continued on dysphagia diet.   #COVID-19 Patient endorse having a sore throat and cough, that started after Christmas. Tested negative for COVID at that time, however, tested positive in the ED. Patient was not treated with antivirals or steroids, as there was likely little benefit, given that she remained on room air throughout admission. Treated supportively and symptomatically improved prior to discharge.   #Dementia #Deconditioning Patient was recommended to go to SNF by PT, however, suspect that  with this patient's dementia, and hospital delirium, that she would likely do better at home. Her two daughters have been assisting the patient with much of her care recently, and will work to see if they can provide 24/7 assistance, as she would likely do better at home in the long-term. Will discharge to home with home health PT/OT.   #Anxiety #Depression Resumed home zoloft 50 mg   #Hypothyroidism Resumed home synthroid 75 mcg. TSH within normal limits.   #Type 2 diabetes Last a1c 5.6 in October. Discontinued home metformin 500 mg qhs, as there is likely little benefit at this point.      DISCHARGE INSTRUCTIONS:   Discharge Instructions     Call MD for:  difficulty breathing, headache or visual disturbances   Complete by: As directed    Call MD for:  temperature >100.4    Complete by: As directed    Diet general   Complete by: As directed    Soft diet, encourage Ensure/Boost supplements   Face-to-face encounter (required for Medicare/Medicaid patients)   Complete by: As directed    I Akasia Ahmad N Lakeisha Waldrop certify that this patient is under my care and that I, or a nurse practitioner or physician's assistant working with me, had a face-to-face encounter that meets the physician face-to-face encounter requirements with this patient on 02/27/2021. The encounter with the patient was in whole, or in part for the following medical condition(s) which is the primary reason for home health care (List medical condition): dysphagia and deconditioning   The encounter with the patient was in whole, or in part, for the following medical condition, which is the primary reason for home health care: deconditioning   I certify that, based on my findings, the following services are medically necessary home health services:  Physical therapy Speech language pathology     Reason for Medically Necessary Home Health Services: Therapy- Skilled Evaluation of Speech Comprehension and Safe Swallowing   My clinical findings support the need for the above services: Cognitive impairments, dementia, or mental confusion  that make it unsafe to leave home   Further, I certify that my clinical findings support that this patient is homebound due to: Mental confusion   Home Health   Complete by: As directed    To provide the following care/treatments:  PT OT SLP     Increase activity slowly   Complete by: As directed       Dear Ms. Record,  You were hospitalized for Afib and your difficulty swallowing. Your swallowing difficulties are likely related to dementia, and we have arranged for home health physical therapy, occupational therapy, and speech therapy to work with you on discharge, since you are not going to a skilled nursing facility.   For your Afib, you were started on Cardizem 60 mg to  be taken every 6 hours, and metoprolol 150 mg to be taken twice daily. Both of these medications, in addition to your eliqius, can be crushed. Follow up with your cardiologist regarding your Afib to see how you are doing with these medications.  Please also arrange to see your primary care physician in ~1-2 weeks for a hospital follow up. I am glad you are feeling better!    SUBJECTIVE:  DAYLEN HACK was seen and evaluated on the day of discharge. She is much more awake and interactive today. Her cough is significant improved. Dysphagia is still present, however, patient has been tolerating Boost/Ensure drinks well.   Discharge Vitals:   BP  Pulse 100    Temp 97.8 °F (36.6 °C) (Oral)    Resp 20    Ht 5' 3" (1.6 m)    Wt 75.9 kg    LMP 05/11/1992    SpO2 98%    BMI 29.64 kg/m²  ° °OBJECTIVE:  °General: Pleasant, elderly female resting comfortably and laying in bed. No acute distress. °CV: Tachycardic rate, irregular rhythm.  °Pulmonary: Lungs CTAB. Normal effort.  °Skin: Warm and dry.  °Neuro: Awake and alert, able to converse appropriately.  °Psych: Normal mood and affect ° ° °Pertinent Labs, Studies, and Procedures:  °CBC Latest Ref Rng & Units 02/23/2021 02/22/2021 02/21/2021  °WBC 4.0 - 10.5 K/uL 7.5 8.4 8.8  °Hemoglobin 12.0 - 15.0 g/dL 14.1 15.1(H) 14.2  °Hematocrit 36.0 - 46.0 % 42.0 46.1(H) 44.6  °Platelets 150 - 400 K/uL 289 276 303  ° ° °CMP Latest Ref Rng & Units 02/25/2021 02/24/2021 02/23/2021  °Glucose 70 - 99 mg/dL 148(H) 106(H) 97  °BUN 8 - 23 mg/dL 8 8 10  °Creatinine 0.44 - 1.00 mg/dL 0.74 0.80 0.85  °Sodium 135 - 145 mmol/L 137 141 141  °Potassium 3.5 - 5.1 mmol/L 3.6 3.7 3.6  °Chloride 98 - 111 mmol/L 103 105 107  °CO2 22 - 32 mmol/L 25 25 26  °Calcium 8.9 - 10.3 mg/dL 8.7(L) 9.0 9.0  °Total Protein 6.5 - 8.1 g/dL - - -  °Total Bilirubin 0.3 - 1.2 mg/dL - - -  °Alkaline Phos 38 - 126 U/L - - -  °AST 15 - 41 U/L - - -  °ALT 0 - 44 U/L - - -  ° ° ° °Signed: ° , D.O.  °Internal  Medicine Resident, PGY-1 °[facility]  °Pager: #[phone] °1:44 PM, 02/27/2021  ° ° ° ° °

## 2021-02-27 NOTE — TOC Transition Note (Signed)
Transition of Care [facility]) - CM/SW Discharge Note   Patient Details  Name: Veronica Paul MRN: [id number] Date of Birth: 01/11/1936  Transition of Care [facility]) CM/SW Contact:  Zenon Mayo, RN Phone Number: 02/27/2021, 2:22 PM   Clinical Narrative:    NCM spoke with patient daughter, Neoma Laming, offered choice from Medicare. Gov , she states she does not have a preference.  NCM made referral to Angie with [facility].  She states she can take referral.  Soc will begin 24 to 48hrs post dc.  Neoma Laming states she also will need rolling walker and 3 n 1 and she is ok with [employer] supplying this.  NCM made referral to [facility] with [facility], this will be delivered to patient room prior to dc. Patient will be transported by car.    Final next level of care: [place] Barriers to Discharge: No Barriers Identified   Patient Goals and CMS Choice Patient states their goals for this hospitalization and ongoing recovery are:: return home with [facility] CMS Medicare.gov Compare Post Acute Care list provided to:: Patient Represenative (must comment) Choice offered to / list presented to : Adult Children  Discharge Placement                       Discharge Plan and Services     Post Acute Care Choice: [place]          DME Arranged: Walker rolling, 3-N-1 DME Agency: [employer] Date DME Agency Contacted: 02/27/21 Time DME Agency Contacted: [phone] Representative spoke with at DME Agency: [place]: RN, PT, OT, Nurse's Aide, Speech Therapy, Social Work [employer] Agency: [facility] Date [place]: 02/27/21 Time [place]: 1422 Representative spoke with at [place]: [place] ([place]) Interventions     Readmission Risk Interventions No flowsheet data found.

## 2021-02-27 NOTE — TOC Progression Note (Incomplete)
Transition of Care [facility]) - Progression Note    Patient Details  Name: Veronica Paul MRN: [id number] Date of Birth: [birth year]  Transition [facility] Care [facility]) CM/SW Contact  Veronica Paul, [place] Phone Number: 02/27/2021, 1:09 PM  Clinical Narrative:    CSW received a message Veronica Paul from [facility], she would like to extend an offer to Veronica for SNF but wanted to know if she could come do a face to fae with Veronica because of noted confusion and to see if she has had any behaviors. CSW contacted Veronica Paul about covid precautions, no answer and will wait for a follow up on placement at [facility].  CSW updated Veronica Paul about off but let her know about the offer. Veronica Paul stated that her and her sister reconsidered and would now like to take their mother home. CSW informed NCM and will sign off.   Expected Discharge Plan: [place] Barriers to Discharge: Continued Medical Work up, SNF Covid  Expected Discharge Plan and Services Expected Discharge Plan: [place] Choice: [place] arrangements for the past 2 months: Single Family Home                                       Social Determinants of Health (SDOH) Interventions    Readmission Risk Interventions No flowsheet data found.

## 2021-02-28 ENCOUNTER — Ambulatory Visit: Payer: Medicare HMO | Admitting: Nurse Practitioner

## 2021-02-28 ENCOUNTER — Telehealth: Payer: Self-pay | Admitting: Cardiology

## 2021-02-28 ENCOUNTER — Telehealth: Payer: Self-pay | Admitting: Nurse Practitioner

## 2021-02-28 NOTE — Telephone Encounter (Signed)
Daughter called stating patient was recently in the hospital.  They changed some of her medications, they took her off of metformin, they change her metoprolol they lowered the dosage. They were giving her Metoprolol and cardizem at the same time.  She states Dr. Percival Spanish had told her not to take them at the same time.

## 2021-02-28 NOTE — Telephone Encounter (Signed)
Pt was seen in the hospital and when she was discharged metformin was not included on her med list. Her daughter is calling in to see if she needs to continue taking. Please call back and advise.

## 2021-02-28 NOTE — Telephone Encounter (Signed)
Spoke to patient's daughter Neoma Laming she stated mother was discharged from [facility] yesterday.Stated mother's medications were changed.She wanted to make sure Dr.Hochrein agreed with changes.Stated Cardizem was changed to 60 mg three times a day.Metoprolol changed to Succinate 100 mg 1&1/2 tablets twice a day.She was told ok to take with Cardizem.Stated Dr.Hochrein told her to take 1 hour apart.Metformin was stopped.Message sent to [place] for advice.

## 2021-02-28 NOTE — Telephone Encounter (Signed)
"  Last a1c 5.6 in October, it is 6.1 this admission. Given advanced age, there is no significant benefit to continuing metformin 500 mg daily and will discontinue this. CBGs 115-118. No need for frequent CBGs or SSI. "    The above was noted while in the hospital and they discontinued her metformin. Should pt stay off metformin or go back on it?

## 2021-02-28 NOTE — Telephone Encounter (Signed)
Spoke with family and we are going to hold metformin until labs are redone in 2 1/2 months

## 2021-03-01 ENCOUNTER — Telehealth: Payer: Self-pay

## 2021-03-01 NOTE — Telephone Encounter (Signed)
Spoke to patient's daughter Neoma Laming Dr.Hochrein agreed with medication changes.

## 2021-03-01 NOTE — Telephone Encounter (Signed)
Transition Care Management Follow-up Telephone Call Date of discharge and from where: 02/27/21 - [facility] - A.fib with RVR and dehydration How have you been since you were released from the hospital? She is still having a hard time - very weak, confused, can only swallow liquids or pureed food Any questions or concerns? No  Items Reviewed: Did the pt receive and understand the discharge instructions provided? Yes  Medications obtained and verified? Yes  Other? No  Any new allergies since your discharge? No  Dietary orders reviewed? Yes Do you have support at home? Yes   Home Care and Equipment/Supplies: Were home health services ordered? yes If so, what is the name of the agency? [facility]  Has the agency set up a time to come to the patient's home? no Were any new equipment or medical supplies ordered?  Yes: 2 wheel walker and 3 in 1  What is the name of the medical supply agency? [facility] Were you able to get the supplies/equipment? yes Do you have any questions related to the use of the equipment or supplies? No  Functional Questionnaire: (I = Independent and D = Dependent) ADLs: I - with a lot of assistance  Bathing/Dressing- D  Meal Prep- D  Eating- I - mostly - sometimes they are feeing her  Maintaining continence- I  Transferring/Ambulation- I - with walker and someone guiding her  Managing Meds- D  Follow up appointments reviewed:  PCP Hospital f/u appt confirmed? Yes  Scheduled to see Chevis Pretty on 03/07/21 @ 9:15. [facility] f/u appt confirmed? Yes  Scheduled to see Hochrein on 04/10/21 @ 11:40. Are transportation arrangements needed? No  If their condition worsens, is the pt aware to call PCP or go to the Emergency Dept.? Yes Was the patient provided with contact information for the PCP's office or ED? Yes Was to pt encouraged to call back with questions or concerns? Yes

## 2021-03-04 ENCOUNTER — Other Ambulatory Visit: Payer: Self-pay | Admitting: *Deleted

## 2021-03-04 ENCOUNTER — Ambulatory Visit: Payer: Medicare HMO | Admitting: Nurse Practitioner

## 2021-03-04 DIAGNOSIS — G309 Alzheimer's disease, unspecified: Secondary | ICD-10-CM

## 2021-03-04 DIAGNOSIS — I4891 Unspecified atrial fibrillation: Secondary | ICD-10-CM

## 2021-03-04 DIAGNOSIS — F0280 Dementia in other diseases classified elsewhere without behavioral disturbance: Secondary | ICD-10-CM

## 2021-03-07 ENCOUNTER — Telehealth: Payer: Self-pay | Admitting: Nurse Practitioner

## 2021-03-07 ENCOUNTER — Encounter: Payer: Self-pay | Admitting: Nurse Practitioner

## 2021-03-07 ENCOUNTER — Ambulatory Visit (INDEPENDENT_AMBULATORY_CARE_PROVIDER_SITE_OTHER): Payer: Medicare HMO | Admitting: Nurse Practitioner

## 2021-03-07 VITALS — BP 162/88 | HR 112 | Temp 96.9°F | Resp 20 | Ht 63.0 in | Wt 167.0 lb

## 2021-03-07 DIAGNOSIS — I4891 Unspecified atrial fibrillation: Secondary | ICD-10-CM

## 2021-03-07 DIAGNOSIS — Z7689 Persons encountering health services in other specified circumstances: Secondary | ICD-10-CM

## 2021-03-07 DIAGNOSIS — R300 Dysuria: Secondary | ICD-10-CM

## 2021-03-07 DIAGNOSIS — R739 Hyperglycemia, unspecified: Secondary | ICD-10-CM | POA: Diagnosis not present

## 2021-03-07 DIAGNOSIS — I10 Essential (primary) hypertension: Secondary | ICD-10-CM

## 2021-03-07 LAB — URINALYSIS, COMPLETE
Bilirubin, UA: NEGATIVE
Glucose, UA: NEGATIVE
Ketones, UA: NEGATIVE
Nitrite, UA: NEGATIVE
Protein,UA: NEGATIVE
Specific Gravity, UA: 1.015 (ref 1.005–1.030)
Urobilinogen, Ur: 0.2 mg/dL (ref 0.2–1.0)
pH, UA: 7 (ref 5.0–7.5)

## 2021-03-07 LAB — CBC WITH DIFFERENTIAL/PLATELET
Basophils Absolute: 0.1 10*3/uL (ref 0.0–0.2)
Basos: 1 %
EOS (ABSOLUTE): 0.2 10*3/uL (ref 0.0–0.4)
Eos: 2 %
Hematocrit: 44.3 % (ref 34.0–46.6)
Hemoglobin: 14.6 g/dL (ref 11.1–15.9)
Immature Grans (Abs): 0 10*3/uL (ref 0.0–0.1)
Immature Granulocytes: 0 %
Lymphocytes Absolute: 1.9 10*3/uL (ref 0.7–3.1)
Lymphs: 20 %
MCH: 28.6 pg (ref 26.6–33.0)
MCHC: 33 g/dL (ref 31.5–35.7)
MCV: 87 fL (ref 79–97)
Monocytes Absolute: 0.7 10*3/uL (ref 0.1–0.9)
Monocytes: 7 %
Neutrophils Absolute: 6.6 10*3/uL (ref 1.4–7.0)
Neutrophils: 70 %
Platelets: 354 10*3/uL (ref 150–450)
RBC: 5.1 x10E6/uL (ref 3.77–5.28)
RDW: 12.7 % (ref 11.7–15.4)
WBC: 9.5 10*3/uL (ref 3.4–10.8)

## 2021-03-07 LAB — CMP14+EGFR
ALT: 30 IU/L (ref 0–32)
AST: 24 IU/L (ref 0–40)
Albumin/Globulin Ratio: 1.5 (ref 1.2–2.2)
Albumin: 4.2 g/dL (ref 3.6–4.6)
Alkaline Phosphatase: 83 IU/L (ref 44–121)
BUN/Creatinine Ratio: 10 — ABNORMAL LOW (ref 12–28)
BUN: 9 mg/dL (ref 8–27)
Bilirubin Total: 0.4 mg/dL (ref 0.0–1.2)
CO2: 25 mmol/L (ref 20–29)
Calcium: 9.6 mg/dL (ref 8.7–10.3)
Chloride: 99 mmol/L (ref 96–106)
Creatinine, Ser: 0.92 mg/dL (ref 0.57–1.00)
Globulin, Total: 2.8 g/dL (ref 1.5–4.5)
Glucose: 111 mg/dL — ABNORMAL HIGH (ref 70–99)
Potassium: 4.3 mmol/L (ref 3.5–5.2)
Sodium: 147 mmol/L — ABNORMAL HIGH (ref 134–144)
Total Protein: 7 g/dL (ref 6.0–8.5)
eGFR: 62 mL/min/{1.73_m2} (ref >59)

## 2021-03-07 LAB — MICROSCOPIC EXAMINATION: Renal Epithel, UA: NONE SEEN /hpf

## 2021-03-07 LAB — BAYER DCA HB A1C WAIVED: HB A1C (BAYER DCA - WAIVED): 6.1 % — ABNORMAL HIGH (ref 4.8–5.6)

## 2021-03-07 MED ORDER — NYSTATIN 100000 UNIT/GM EX POWD: 1.0000 "application " | Freq: Three times a day (TID) | CUTANEOUS | 0 refills | Status: DC

## 2021-03-07 NOTE — Progress Notes (Signed)
Subjective:    Patient ID: Veronica Paul, female    DOB: [birth year], 86 y.o.   MRN: [id number] Today's visit was for Transitional Care Management.  The patient was discharged from [facility] on 02/27/21 with a primary diagnosis of Atrial fib with RVR and dehydration.   Contact with the patient and/or caregiver, by a clinical staff member, was made on 03/01/20 and was documented as a telephone encounter within the EMR.  Through chart review and discussion with the patient I have determined that management of their condition is of moderate complexity.   Since coming home from hospital she has been doing well. They stopped her metformin and her blood sugars have been fine. They also stopped hr metoprolol 200mg  and changed it to 75mg  bid. and put her on diltiazem. She denies heart racing. Her daughter is with her today and she is very weak. Her daughters have been staying with her since discharged. OT and PT has been ordered but has not started yet. She has gradually been improving.  Has had some urinary symptoms and daughter wants urine checked.   She has not taken any meds this morning    Review of Systems  Constitutional:  Negative for diaphoresis.  Eyes:  Negative for pain.  Respiratory:  Negative for shortness of breath.   Cardiovascular:  Negative for chest pain, palpitations and leg swelling.  Gastrointestinal:  Negative for abdominal pain.  Endocrine: Negative for polydipsia.  Skin:  Negative for rash.  Neurological:  Negative for dizziness, weakness and headaches.  Hematological:  Does not bruise/bleed easily.  All other systems reviewed and are negative.     Objective:   Physical Exam Vitals and nursing note reviewed.  Constitutional:      General: She is not in acute distress.    Appearance: Normal appearance. She is well-developed.  Neck:     Vascular: No carotid bruit or JVD.  Cardiovascular:     Rate and Rhythm: Normal rate and regular rhythm.     Heart sounds:  Normal heart sounds.  Pulmonary:     Effort: Pulmonary effort is normal. No respiratory distress.     Breath sounds: Normal breath sounds. No wheezing or rales.  Chest:     Chest wall: No tenderness.  Abdominal:     General: Bowel sounds are normal. There is no distension or abdominal bruit.     Palpations: Abdomen is soft. There is no hepatomegaly, splenomegaly, mass or pulsatile mass.     Tenderness: There is no abdominal tenderness.  Musculoskeletal:        General: Normal range of motion.     Cervical back: Normal range of motion and neck supple.  Lymphadenopathy:     Cervical: No cervical adenopathy.  Skin:    General: Skin is warm and dry.  Neurological:     Mental Status: She is alert and oriented to person, place, and time.     Deep Tendon Reflexes: Reflexes are normal and symmetric.  Psychiatric:        Behavior: Behavior normal.        Thought Content: Thought content normal.        Judgment: Judgment normal.    BP (!) 162/88    Pulse (!) 112    Temp (!) 96.9 F (36.1 C) (Temporal)    Resp 20    Ht 5\' 3"  (1.6 m)    Wt 167 lb (75.8 kg)    LMP 05/11/1992    SpO2 98%  BMI 29.58 kg/m   Urine is clear       Assessment & Plan:  Veronica Paul in today with chief complaint of Transitions Of Care  Transition of care  1. Dysuria Cultured ordered - Urinalysis, Complete - Urine Culture  2. Atrial fibrillation with RVR (HCC) Take meds as soon as possible-  Keep check of heart rate  4. Primary hypertension Take meds as soon as possible    The above assessment and management plan was discussed with the patient. The patient verbalized understanding of and has agreed to the management plan. Patient is aware to call the clinic if symptoms persist or worsen. Patient is aware when to return to the clinic for a follow-up visit. Patient educated on when it is appropriate to go to the emergency department.   [name], FNP

## 2021-03-08 DIAGNOSIS — K582 Mixed irritable bowel syndrome: Secondary | ICD-10-CM | POA: Diagnosis not present

## 2021-03-08 DIAGNOSIS — D124 Benign neoplasm of descending colon: Secondary | ICD-10-CM | POA: Diagnosis not present

## 2021-03-08 DIAGNOSIS — R634 Abnormal weight loss: Secondary | ICD-10-CM | POA: Diagnosis not present

## 2021-03-08 DIAGNOSIS — Z6834 Body mass index (BMI) 34.0-34.9, adult: Secondary | ICD-10-CM | POA: Diagnosis not present

## 2021-03-08 DIAGNOSIS — I10 Essential (primary) hypertension: Secondary | ICD-10-CM | POA: Diagnosis not present

## 2021-03-08 DIAGNOSIS — M8588 Other specified disorders of bone density and structure, other site: Secondary | ICD-10-CM | POA: Diagnosis not present

## 2021-03-08 DIAGNOSIS — E119 Type 2 diabetes mellitus without complications: Secondary | ICD-10-CM | POA: Diagnosis not present

## 2021-03-08 DIAGNOSIS — R1310 Dysphagia, unspecified: Secondary | ICD-10-CM | POA: Diagnosis not present

## 2021-03-08 DIAGNOSIS — U071 COVID-19: Secondary | ICD-10-CM | POA: Diagnosis not present

## 2021-03-08 DIAGNOSIS — E039 Hypothyroidism, unspecified: Secondary | ICD-10-CM | POA: Diagnosis not present

## 2021-03-08 DIAGNOSIS — D123 Benign neoplasm of transverse colon: Secondary | ICD-10-CM | POA: Diagnosis not present

## 2021-03-08 DIAGNOSIS — E876 Hypokalemia: Secondary | ICD-10-CM | POA: Diagnosis not present

## 2021-03-08 DIAGNOSIS — R931 Abnormal findings on diagnostic imaging of heart and coronary circulation: Secondary | ICD-10-CM | POA: Diagnosis not present

## 2021-03-08 DIAGNOSIS — R768 Other specified abnormal immunological findings in serum: Secondary | ICD-10-CM | POA: Diagnosis not present

## 2021-03-08 DIAGNOSIS — D122 Benign neoplasm of ascending colon: Secondary | ICD-10-CM | POA: Diagnosis not present

## 2021-03-08 DIAGNOSIS — E785 Hyperlipidemia, unspecified: Secondary | ICD-10-CM | POA: Diagnosis not present

## 2021-03-08 DIAGNOSIS — I4891 Unspecified atrial fibrillation: Secondary | ICD-10-CM | POA: Diagnosis not present

## 2021-03-08 DIAGNOSIS — D120 Benign neoplasm of cecum: Secondary | ICD-10-CM | POA: Diagnosis not present

## 2021-03-08 DIAGNOSIS — F0284 Dementia in other diseases classified elsewhere, unspecified severity, with anxiety: Secondary | ICD-10-CM | POA: Diagnosis not present

## 2021-03-08 DIAGNOSIS — F32A Depression, unspecified: Secondary | ICD-10-CM | POA: Diagnosis not present

## 2021-03-08 DIAGNOSIS — F0283 Dementia in other diseases classified elsewhere, unspecified severity, with mood disturbance: Secondary | ICD-10-CM | POA: Diagnosis not present

## 2021-03-08 DIAGNOSIS — Z7901 Long term (current) use of anticoagulants: Secondary | ICD-10-CM | POA: Diagnosis not present

## 2021-03-08 LAB — URINE CULTURE

## 2021-03-11 ENCOUNTER — Encounter: Payer: Self-pay | Admitting: Cardiology

## 2021-03-12 ENCOUNTER — Telehealth: Payer: Self-pay | Admitting: Nurse Practitioner

## 2021-03-12 NOTE — Telephone Encounter (Signed)
The daughter states the eliquis is $400 a month and didn't know if this was something you could help with. Please advise?

## 2021-03-13 NOTE — Telephone Encounter (Signed)
Minus Breeding, MD  You 10 hours ago (9:28 PM)   Yes she can take the 30 mg short acting as needed.

## 2021-03-13 NOTE — Telephone Encounter (Signed)
Eliquis' program is very strict Patient must spend 3% of entire household income to qualify Example: if she has household income of $2000/month--must spend $720 out of pocket first--have proof, then qualify  I'm unable to get it until that time  Thanks!

## 2021-03-13 NOTE — Telephone Encounter (Signed)
Spoke with Veronica Paul and advised on [name] suggestion and she stated that she would gather up documents and see if she thinks mom qualifies and she would get back with [place]

## 2021-03-15 DIAGNOSIS — D120 Benign neoplasm of cecum: Secondary | ICD-10-CM | POA: Diagnosis not present

## 2021-03-15 DIAGNOSIS — R1310 Dysphagia, unspecified: Secondary | ICD-10-CM | POA: Diagnosis not present

## 2021-03-15 DIAGNOSIS — R768 Other specified abnormal immunological findings in serum: Secondary | ICD-10-CM | POA: Diagnosis not present

## 2021-03-15 DIAGNOSIS — R634 Abnormal weight loss: Secondary | ICD-10-CM | POA: Diagnosis not present

## 2021-03-15 DIAGNOSIS — U071 COVID-19: Secondary | ICD-10-CM | POA: Diagnosis not present

## 2021-03-15 DIAGNOSIS — E119 Type 2 diabetes mellitus without complications: Secondary | ICD-10-CM | POA: Diagnosis not present

## 2021-03-15 DIAGNOSIS — E876 Hypokalemia: Secondary | ICD-10-CM | POA: Diagnosis not present

## 2021-03-15 DIAGNOSIS — F32A Depression, unspecified: Secondary | ICD-10-CM | POA: Diagnosis not present

## 2021-03-15 DIAGNOSIS — D122 Benign neoplasm of ascending colon: Secondary | ICD-10-CM | POA: Diagnosis not present

## 2021-03-15 DIAGNOSIS — F0284 Dementia in other diseases classified elsewhere, unspecified severity, with anxiety: Secondary | ICD-10-CM | POA: Diagnosis not present

## 2021-03-15 DIAGNOSIS — I10 Essential (primary) hypertension: Secondary | ICD-10-CM | POA: Diagnosis not present

## 2021-03-15 DIAGNOSIS — D124 Benign neoplasm of descending colon: Secondary | ICD-10-CM | POA: Diagnosis not present

## 2021-03-15 DIAGNOSIS — D123 Benign neoplasm of transverse colon: Secondary | ICD-10-CM | POA: Diagnosis not present

## 2021-03-15 DIAGNOSIS — I4891 Unspecified atrial fibrillation: Secondary | ICD-10-CM | POA: Diagnosis not present

## 2021-03-15 DIAGNOSIS — E039 Hypothyroidism, unspecified: Secondary | ICD-10-CM | POA: Diagnosis not present

## 2021-03-15 DIAGNOSIS — R931 Abnormal findings on diagnostic imaging of heart and coronary circulation: Secondary | ICD-10-CM | POA: Diagnosis not present

## 2021-03-15 DIAGNOSIS — M8588 Other specified disorders of bone density and structure, other site: Secondary | ICD-10-CM | POA: Diagnosis not present

## 2021-03-15 DIAGNOSIS — K582 Mixed irritable bowel syndrome: Secondary | ICD-10-CM | POA: Diagnosis not present

## 2021-03-15 DIAGNOSIS — E785 Hyperlipidemia, unspecified: Secondary | ICD-10-CM | POA: Diagnosis not present

## 2021-03-15 DIAGNOSIS — Z7901 Long term (current) use of anticoagulants: Secondary | ICD-10-CM | POA: Diagnosis not present

## 2021-03-15 DIAGNOSIS — Z6834 Body mass index (BMI) 34.0-34.9, adult: Secondary | ICD-10-CM | POA: Diagnosis not present

## 2021-03-15 DIAGNOSIS — F0283 Dementia in other diseases classified elsewhere, unspecified severity, with mood disturbance: Secondary | ICD-10-CM | POA: Diagnosis not present

## 2021-03-16 DIAGNOSIS — U071 COVID-19: Secondary | ICD-10-CM | POA: Diagnosis not present

## 2021-03-16 DIAGNOSIS — D122 Benign neoplasm of ascending colon: Secondary | ICD-10-CM | POA: Diagnosis not present

## 2021-03-16 DIAGNOSIS — D124 Benign neoplasm of descending colon: Secondary | ICD-10-CM | POA: Diagnosis not present

## 2021-03-16 DIAGNOSIS — D120 Benign neoplasm of cecum: Secondary | ICD-10-CM | POA: Diagnosis not present

## 2021-03-16 DIAGNOSIS — E039 Hypothyroidism, unspecified: Secondary | ICD-10-CM | POA: Diagnosis not present

## 2021-03-16 DIAGNOSIS — R634 Abnormal weight loss: Secondary | ICD-10-CM | POA: Diagnosis not present

## 2021-03-16 DIAGNOSIS — Z7901 Long term (current) use of anticoagulants: Secondary | ICD-10-CM | POA: Diagnosis not present

## 2021-03-16 DIAGNOSIS — F0283 Dementia in other diseases classified elsewhere, unspecified severity, with mood disturbance: Secondary | ICD-10-CM | POA: Diagnosis not present

## 2021-03-16 DIAGNOSIS — R1310 Dysphagia, unspecified: Secondary | ICD-10-CM | POA: Diagnosis not present

## 2021-03-16 DIAGNOSIS — I10 Essential (primary) hypertension: Secondary | ICD-10-CM | POA: Diagnosis not present

## 2021-03-16 DIAGNOSIS — E876 Hypokalemia: Secondary | ICD-10-CM | POA: Diagnosis not present

## 2021-03-16 DIAGNOSIS — E119 Type 2 diabetes mellitus without complications: Secondary | ICD-10-CM | POA: Diagnosis not present

## 2021-03-16 DIAGNOSIS — R931 Abnormal findings on diagnostic imaging of heart and coronary circulation: Secondary | ICD-10-CM | POA: Diagnosis not present

## 2021-03-16 DIAGNOSIS — E785 Hyperlipidemia, unspecified: Secondary | ICD-10-CM | POA: Diagnosis not present

## 2021-03-16 DIAGNOSIS — K582 Mixed irritable bowel syndrome: Secondary | ICD-10-CM | POA: Diagnosis not present

## 2021-03-16 DIAGNOSIS — D123 Benign neoplasm of transverse colon: Secondary | ICD-10-CM | POA: Diagnosis not present

## 2021-03-16 DIAGNOSIS — R768 Other specified abnormal immunological findings in serum: Secondary | ICD-10-CM | POA: Diagnosis not present

## 2021-03-16 DIAGNOSIS — Z6834 Body mass index (BMI) 34.0-34.9, adult: Secondary | ICD-10-CM | POA: Diagnosis not present

## 2021-03-16 DIAGNOSIS — M8588 Other specified disorders of bone density and structure, other site: Secondary | ICD-10-CM | POA: Diagnosis not present

## 2021-03-16 DIAGNOSIS — F0284 Dementia in other diseases classified elsewhere, unspecified severity, with anxiety: Secondary | ICD-10-CM | POA: Diagnosis not present

## 2021-03-16 DIAGNOSIS — F32A Depression, unspecified: Secondary | ICD-10-CM | POA: Diagnosis not present

## 2021-03-16 DIAGNOSIS — I4891 Unspecified atrial fibrillation: Secondary | ICD-10-CM | POA: Diagnosis not present

## 2021-03-18 DIAGNOSIS — R1310 Dysphagia, unspecified: Secondary | ICD-10-CM | POA: Diagnosis not present

## 2021-03-18 DIAGNOSIS — F0284 Dementia in other diseases classified elsewhere, unspecified severity, with anxiety: Secondary | ICD-10-CM | POA: Diagnosis not present

## 2021-03-18 DIAGNOSIS — D124 Benign neoplasm of descending colon: Secondary | ICD-10-CM | POA: Diagnosis not present

## 2021-03-18 DIAGNOSIS — E119 Type 2 diabetes mellitus without complications: Secondary | ICD-10-CM | POA: Diagnosis not present

## 2021-03-18 DIAGNOSIS — D123 Benign neoplasm of transverse colon: Secondary | ICD-10-CM | POA: Diagnosis not present

## 2021-03-18 DIAGNOSIS — D120 Benign neoplasm of cecum: Secondary | ICD-10-CM | POA: Diagnosis not present

## 2021-03-18 DIAGNOSIS — D122 Benign neoplasm of ascending colon: Secondary | ICD-10-CM | POA: Diagnosis not present

## 2021-03-18 DIAGNOSIS — Z6834 Body mass index (BMI) 34.0-34.9, adult: Secondary | ICD-10-CM | POA: Diagnosis not present

## 2021-03-18 DIAGNOSIS — R634 Abnormal weight loss: Secondary | ICD-10-CM | POA: Diagnosis not present

## 2021-03-18 DIAGNOSIS — F0283 Dementia in other diseases classified elsewhere, unspecified severity, with mood disturbance: Secondary | ICD-10-CM | POA: Diagnosis not present

## 2021-03-18 DIAGNOSIS — E876 Hypokalemia: Secondary | ICD-10-CM | POA: Diagnosis not present

## 2021-03-18 DIAGNOSIS — M8588 Other specified disorders of bone density and structure, other site: Secondary | ICD-10-CM | POA: Diagnosis not present

## 2021-03-18 DIAGNOSIS — F32A Depression, unspecified: Secondary | ICD-10-CM | POA: Diagnosis not present

## 2021-03-18 DIAGNOSIS — R768 Other specified abnormal immunological findings in serum: Secondary | ICD-10-CM | POA: Diagnosis not present

## 2021-03-18 DIAGNOSIS — U071 COVID-19: Secondary | ICD-10-CM | POA: Diagnosis not present

## 2021-03-18 DIAGNOSIS — K582 Mixed irritable bowel syndrome: Secondary | ICD-10-CM | POA: Diagnosis not present

## 2021-03-18 DIAGNOSIS — E785 Hyperlipidemia, unspecified: Secondary | ICD-10-CM | POA: Diagnosis not present

## 2021-03-18 DIAGNOSIS — I4891 Unspecified atrial fibrillation: Secondary | ICD-10-CM | POA: Diagnosis not present

## 2021-03-18 DIAGNOSIS — E039 Hypothyroidism, unspecified: Secondary | ICD-10-CM | POA: Diagnosis not present

## 2021-03-18 DIAGNOSIS — Z7901 Long term (current) use of anticoagulants: Secondary | ICD-10-CM | POA: Diagnosis not present

## 2021-03-18 DIAGNOSIS — R931 Abnormal findings on diagnostic imaging of heart and coronary circulation: Secondary | ICD-10-CM | POA: Diagnosis not present

## 2021-03-18 DIAGNOSIS — I10 Essential (primary) hypertension: Secondary | ICD-10-CM | POA: Diagnosis not present

## 2021-03-19 ENCOUNTER — Other Ambulatory Visit: Payer: Self-pay

## 2021-03-19 ENCOUNTER — Ambulatory Visit (INDEPENDENT_AMBULATORY_CARE_PROVIDER_SITE_OTHER): Payer: Medicare HMO

## 2021-03-19 DIAGNOSIS — I10 Essential (primary) hypertension: Secondary | ICD-10-CM | POA: Diagnosis not present

## 2021-03-19 DIAGNOSIS — D124 Benign neoplasm of descending colon: Secondary | ICD-10-CM | POA: Diagnosis not present

## 2021-03-19 DIAGNOSIS — F32A Depression, unspecified: Secondary | ICD-10-CM

## 2021-03-19 DIAGNOSIS — Z8616 Personal history of COVID-19: Secondary | ICD-10-CM | POA: Diagnosis not present

## 2021-03-19 DIAGNOSIS — R768 Other specified abnormal immunological findings in serum: Secondary | ICD-10-CM

## 2021-03-19 DIAGNOSIS — Z6834 Body mass index (BMI) 34.0-34.9, adult: Secondary | ICD-10-CM | POA: Diagnosis not present

## 2021-03-19 DIAGNOSIS — R1310 Dysphagia, unspecified: Secondary | ICD-10-CM | POA: Diagnosis not present

## 2021-03-19 DIAGNOSIS — D122 Benign neoplasm of ascending colon: Secondary | ICD-10-CM | POA: Diagnosis not present

## 2021-03-19 DIAGNOSIS — R931 Abnormal findings on diagnostic imaging of heart and coronary circulation: Secondary | ICD-10-CM | POA: Diagnosis not present

## 2021-03-19 DIAGNOSIS — D120 Benign neoplasm of cecum: Secondary | ICD-10-CM | POA: Diagnosis not present

## 2021-03-19 DIAGNOSIS — F0284 Dementia in other diseases classified elsewhere, unspecified severity, with anxiety: Secondary | ICD-10-CM | POA: Diagnosis not present

## 2021-03-19 DIAGNOSIS — R634 Abnormal weight loss: Secondary | ICD-10-CM

## 2021-03-19 DIAGNOSIS — F0283 Dementia in other diseases classified elsewhere, unspecified severity, with mood disturbance: Secondary | ICD-10-CM | POA: Diagnosis not present

## 2021-03-19 DIAGNOSIS — K582 Mixed irritable bowel syndrome: Secondary | ICD-10-CM | POA: Diagnosis not present

## 2021-03-19 DIAGNOSIS — E039 Hypothyroidism, unspecified: Secondary | ICD-10-CM

## 2021-03-19 DIAGNOSIS — D123 Benign neoplasm of transverse colon: Secondary | ICD-10-CM | POA: Diagnosis not present

## 2021-03-19 DIAGNOSIS — M8588 Other specified disorders of bone density and structure, other site: Secondary | ICD-10-CM | POA: Diagnosis not present

## 2021-03-19 DIAGNOSIS — E785 Hyperlipidemia, unspecified: Secondary | ICD-10-CM | POA: Diagnosis not present

## 2021-03-19 DIAGNOSIS — E119 Type 2 diabetes mellitus without complications: Secondary | ICD-10-CM

## 2021-03-19 DIAGNOSIS — I4891 Unspecified atrial fibrillation: Secondary | ICD-10-CM

## 2021-03-19 DIAGNOSIS — E876 Hypokalemia: Secondary | ICD-10-CM

## 2021-03-19 DIAGNOSIS — Z7901 Long term (current) use of anticoagulants: Secondary | ICD-10-CM | POA: Diagnosis not present

## 2021-03-19 DIAGNOSIS — U071 COVID-19: Secondary | ICD-10-CM | POA: Diagnosis not present

## 2021-03-20 DIAGNOSIS — I4891 Unspecified atrial fibrillation: Secondary | ICD-10-CM | POA: Diagnosis not present

## 2021-03-20 DIAGNOSIS — F32A Depression, unspecified: Secondary | ICD-10-CM | POA: Diagnosis not present

## 2021-03-20 DIAGNOSIS — K582 Mixed irritable bowel syndrome: Secondary | ICD-10-CM | POA: Diagnosis not present

## 2021-03-20 DIAGNOSIS — E785 Hyperlipidemia, unspecified: Secondary | ICD-10-CM | POA: Diagnosis not present

## 2021-03-20 DIAGNOSIS — D123 Benign neoplasm of transverse colon: Secondary | ICD-10-CM | POA: Diagnosis not present

## 2021-03-20 DIAGNOSIS — R1310 Dysphagia, unspecified: Secondary | ICD-10-CM | POA: Diagnosis not present

## 2021-03-20 DIAGNOSIS — F0283 Dementia in other diseases classified elsewhere, unspecified severity, with mood disturbance: Secondary | ICD-10-CM | POA: Diagnosis not present

## 2021-03-20 DIAGNOSIS — U071 COVID-19: Secondary | ICD-10-CM | POA: Diagnosis not present

## 2021-03-20 DIAGNOSIS — R634 Abnormal weight loss: Secondary | ICD-10-CM | POA: Diagnosis not present

## 2021-03-20 DIAGNOSIS — D124 Benign neoplasm of descending colon: Secondary | ICD-10-CM | POA: Diagnosis not present

## 2021-03-20 DIAGNOSIS — E039 Hypothyroidism, unspecified: Secondary | ICD-10-CM | POA: Diagnosis not present

## 2021-03-20 DIAGNOSIS — D122 Benign neoplasm of ascending colon: Secondary | ICD-10-CM | POA: Diagnosis not present

## 2021-03-20 DIAGNOSIS — M8588 Other specified disorders of bone density and structure, other site: Secondary | ICD-10-CM | POA: Diagnosis not present

## 2021-03-20 DIAGNOSIS — F0284 Dementia in other diseases classified elsewhere, unspecified severity, with anxiety: Secondary | ICD-10-CM | POA: Diagnosis not present

## 2021-03-20 DIAGNOSIS — D120 Benign neoplasm of cecum: Secondary | ICD-10-CM | POA: Diagnosis not present

## 2021-03-20 DIAGNOSIS — Z7901 Long term (current) use of anticoagulants: Secondary | ICD-10-CM | POA: Diagnosis not present

## 2021-03-20 DIAGNOSIS — Z6834 Body mass index (BMI) 34.0-34.9, adult: Secondary | ICD-10-CM | POA: Diagnosis not present

## 2021-03-20 DIAGNOSIS — E876 Hypokalemia: Secondary | ICD-10-CM | POA: Diagnosis not present

## 2021-03-20 DIAGNOSIS — I10 Essential (primary) hypertension: Secondary | ICD-10-CM | POA: Diagnosis not present

## 2021-03-20 DIAGNOSIS — R931 Abnormal findings on diagnostic imaging of heart and coronary circulation: Secondary | ICD-10-CM | POA: Diagnosis not present

## 2021-03-20 DIAGNOSIS — E119 Type 2 diabetes mellitus without complications: Secondary | ICD-10-CM | POA: Diagnosis not present

## 2021-03-20 DIAGNOSIS — R768 Other specified abnormal immunological findings in serum: Secondary | ICD-10-CM | POA: Diagnosis not present

## 2021-03-21 DIAGNOSIS — L84 Corns and callosities: Secondary | ICD-10-CM | POA: Diagnosis not present

## 2021-03-21 DIAGNOSIS — M79676 Pain in unspecified toe(s): Secondary | ICD-10-CM | POA: Diagnosis not present

## 2021-03-21 DIAGNOSIS — E1142 Type 2 diabetes mellitus with diabetic polyneuropathy: Secondary | ICD-10-CM | POA: Diagnosis not present

## 2021-03-21 DIAGNOSIS — B351 Tinea unguium: Secondary | ICD-10-CM | POA: Diagnosis not present

## 2021-03-22 ENCOUNTER — Telehealth: Payer: Self-pay | Admitting: Nurse Practitioner

## 2021-03-22 DIAGNOSIS — I4891 Unspecified atrial fibrillation: Secondary | ICD-10-CM | POA: Diagnosis not present

## 2021-03-22 DIAGNOSIS — F32A Depression, unspecified: Secondary | ICD-10-CM | POA: Diagnosis not present

## 2021-03-22 DIAGNOSIS — D124 Benign neoplasm of descending colon: Secondary | ICD-10-CM | POA: Diagnosis not present

## 2021-03-22 DIAGNOSIS — Z6834 Body mass index (BMI) 34.0-34.9, adult: Secondary | ICD-10-CM | POA: Diagnosis not present

## 2021-03-22 DIAGNOSIS — U071 COVID-19: Secondary | ICD-10-CM | POA: Diagnosis not present

## 2021-03-22 DIAGNOSIS — E119 Type 2 diabetes mellitus without complications: Secondary | ICD-10-CM | POA: Diagnosis not present

## 2021-03-22 DIAGNOSIS — K582 Mixed irritable bowel syndrome: Secondary | ICD-10-CM | POA: Diagnosis not present

## 2021-03-22 DIAGNOSIS — I10 Essential (primary) hypertension: Secondary | ICD-10-CM | POA: Diagnosis not present

## 2021-03-22 DIAGNOSIS — E039 Hypothyroidism, unspecified: Secondary | ICD-10-CM | POA: Diagnosis not present

## 2021-03-22 DIAGNOSIS — D120 Benign neoplasm of cecum: Secondary | ICD-10-CM | POA: Diagnosis not present

## 2021-03-22 DIAGNOSIS — F0284 Dementia in other diseases classified elsewhere, unspecified severity, with anxiety: Secondary | ICD-10-CM | POA: Diagnosis not present

## 2021-03-22 DIAGNOSIS — R634 Abnormal weight loss: Secondary | ICD-10-CM | POA: Diagnosis not present

## 2021-03-22 DIAGNOSIS — Z7901 Long term (current) use of anticoagulants: Secondary | ICD-10-CM | POA: Diagnosis not present

## 2021-03-22 DIAGNOSIS — R768 Other specified abnormal immunological findings in serum: Secondary | ICD-10-CM | POA: Diagnosis not present

## 2021-03-22 DIAGNOSIS — D122 Benign neoplasm of ascending colon: Secondary | ICD-10-CM | POA: Diagnosis not present

## 2021-03-22 DIAGNOSIS — F0283 Dementia in other diseases classified elsewhere, unspecified severity, with mood disturbance: Secondary | ICD-10-CM | POA: Diagnosis not present

## 2021-03-22 DIAGNOSIS — E876 Hypokalemia: Secondary | ICD-10-CM | POA: Diagnosis not present

## 2021-03-22 DIAGNOSIS — R1310 Dysphagia, unspecified: Secondary | ICD-10-CM | POA: Diagnosis not present

## 2021-03-22 DIAGNOSIS — M8588 Other specified disorders of bone density and structure, other site: Secondary | ICD-10-CM | POA: Diagnosis not present

## 2021-03-22 DIAGNOSIS — D123 Benign neoplasm of transverse colon: Secondary | ICD-10-CM | POA: Diagnosis not present

## 2021-03-22 DIAGNOSIS — E785 Hyperlipidemia, unspecified: Secondary | ICD-10-CM | POA: Diagnosis not present

## 2021-03-22 DIAGNOSIS — R931 Abnormal findings on diagnostic imaging of heart and coronary circulation: Secondary | ICD-10-CM | POA: Diagnosis not present

## 2021-03-22 MED ORDER — METOPROLOL TARTRATE 100 MG PO TABS: 150.0000 mg | ORAL_TABLET | Freq: Two times a day (BID) | ORAL | 1 refills | Status: DC

## 2021-03-22 NOTE — Telephone Encounter (Signed)
Metoprolol refilled.

## 2021-03-25 DIAGNOSIS — R768 Other specified abnormal immunological findings in serum: Secondary | ICD-10-CM | POA: Diagnosis not present

## 2021-03-25 DIAGNOSIS — D122 Benign neoplasm of ascending colon: Secondary | ICD-10-CM | POA: Diagnosis not present

## 2021-03-25 DIAGNOSIS — Z7901 Long term (current) use of anticoagulants: Secondary | ICD-10-CM | POA: Diagnosis not present

## 2021-03-25 DIAGNOSIS — D120 Benign neoplasm of cecum: Secondary | ICD-10-CM | POA: Diagnosis not present

## 2021-03-25 DIAGNOSIS — R1310 Dysphagia, unspecified: Secondary | ICD-10-CM | POA: Diagnosis not present

## 2021-03-25 DIAGNOSIS — I4891 Unspecified atrial fibrillation: Secondary | ICD-10-CM | POA: Diagnosis not present

## 2021-03-25 DIAGNOSIS — Z6834 Body mass index (BMI) 34.0-34.9, adult: Secondary | ICD-10-CM | POA: Diagnosis not present

## 2021-03-25 DIAGNOSIS — R931 Abnormal findings on diagnostic imaging of heart and coronary circulation: Secondary | ICD-10-CM | POA: Diagnosis not present

## 2021-03-25 DIAGNOSIS — F0284 Dementia in other diseases classified elsewhere, unspecified severity, with anxiety: Secondary | ICD-10-CM | POA: Diagnosis not present

## 2021-03-25 DIAGNOSIS — U071 COVID-19: Secondary | ICD-10-CM | POA: Diagnosis not present

## 2021-03-25 DIAGNOSIS — F32A Depression, unspecified: Secondary | ICD-10-CM | POA: Diagnosis not present

## 2021-03-25 DIAGNOSIS — D123 Benign neoplasm of transverse colon: Secondary | ICD-10-CM | POA: Diagnosis not present

## 2021-03-25 DIAGNOSIS — F0283 Dementia in other diseases classified elsewhere, unspecified severity, with mood disturbance: Secondary | ICD-10-CM | POA: Diagnosis not present

## 2021-03-25 DIAGNOSIS — E785 Hyperlipidemia, unspecified: Secondary | ICD-10-CM | POA: Diagnosis not present

## 2021-03-25 DIAGNOSIS — E876 Hypokalemia: Secondary | ICD-10-CM | POA: Diagnosis not present

## 2021-03-25 DIAGNOSIS — I10 Essential (primary) hypertension: Secondary | ICD-10-CM | POA: Diagnosis not present

## 2021-03-25 DIAGNOSIS — R634 Abnormal weight loss: Secondary | ICD-10-CM | POA: Diagnosis not present

## 2021-03-25 DIAGNOSIS — E039 Hypothyroidism, unspecified: Secondary | ICD-10-CM | POA: Diagnosis not present

## 2021-03-25 DIAGNOSIS — K582 Mixed irritable bowel syndrome: Secondary | ICD-10-CM | POA: Diagnosis not present

## 2021-03-25 DIAGNOSIS — M8588 Other specified disorders of bone density and structure, other site: Secondary | ICD-10-CM | POA: Diagnosis not present

## 2021-03-25 DIAGNOSIS — D124 Benign neoplasm of descending colon: Secondary | ICD-10-CM | POA: Diagnosis not present

## 2021-03-25 DIAGNOSIS — E119 Type 2 diabetes mellitus without complications: Secondary | ICD-10-CM | POA: Diagnosis not present

## 2021-03-26 DIAGNOSIS — F419 Anxiety disorder, unspecified: Secondary | ICD-10-CM | POA: Insufficient documentation

## 2021-03-26 NOTE — Progress Notes (Signed)
Cardiology Office Note   Date:  03/27/2021   ID:  Veronica Paul, DOB [birth year], MRN [id number]  PCP:  Chevis Pretty, FNP  Cardiologist:   Minus Breeding, MD   Chief Complaint  Patient presents with   Atrial Fibrillation       History of Present Illness: Veronica Paul is a 86 y.o. female who presents for follow up of atrial fib.   She called to be added to the schedule.  I read recently her blood pressures and heart rate diaries.  There is also a long report from her daughter about her recent symptoms.  She has had lots of tachypalpitations.  She has been treated with Cardizem.  She has had a difficult time swallowing Cardizem.    Since I last saw her she has been in the hospital.  She had to call.  I reviewed the hospital records for this visit.  She had dysphagia and dementia.  2 were thought to be related.  She is about to start home speech therapy.  There is also PT and OT.  MRI and head CT were not acutely remarkable.  Her swallowing was thought to be an oropharyngeal issue managing conservatively.  On the plus side she is swallowing better and able to take the Cardizem as prescribed.  She is not having any new chest pressure, neck or arm discomfort.  She once in a while gets a burning burping discomfort goes away with a [facility].  She has not really had any of the tachypalpitations and has not required an additional 30 mg as needed Cardizem.  She is taking the 60 mg 3 times a day.  She has not had any presyncope or syncope.  They think her anxiety is little bit better.    Past Medical History:  Diagnosis Date   Allergy    "my nose runs alot"   Anxiety    Arthritis    Atrial fibrillation ([place])    Blood transfusion without reported diagnosis    "when I had a miscarrage in 1957"   Cataract    bilateral removed   Cholelithiasis    Constipation    x ray showed increased stool in colon- uses Miralax daily and Dulcolax twice a week    Depression    Diabetes  mellitus    Dyslipidemia    HTN (hypertension)    Hx of long-term (current) use of anticoagulants    Hyperlipidemia    Hypertension    Hypothyroidism    Lung cancer ([place])    Obesity    Osteopenia    Vitamin D deficiency     Past Surgical History:  Procedure Laterality Date   CATARACT EXTRACTION Bilateral    COLONOSCOPY     COLONOSCOPY WITH PROPOFOL N/A 05/06/2019   Procedure: COLONOSCOPY WITH PROPOFOL;  Surgeon: Jerene Bears, MD;  Location: [facility];  Service: Gastroenterology;  Laterality: N/A;   LUNG REMOVAL, PARTIAL  1998   Right   POLYPECTOMY  05/06/2019   Procedure: POLYPECTOMY;  Surgeon: Jerene Bears, MD;  Location: [facility];  Service: Gastroenterology;;   THYROIDECTOMY     TUBAL LIGATION     UPPER GASTROINTESTINAL ENDOSCOPY       Current Outpatient Medications  Medication Sig Dispense Refill   acetaminophen (TYLENOL) 500 MG tablet Take 500 mg by mouth every 6 (six) hours as needed for moderate pain, headache or fever.     apixaban (ELIQUIS) 5 MG TABS tablet Take 1 tablet (5  mg total) by mouth 2 (two) times daily. 180 tablet 1   bisacodyl (DULCOLAX) 5 MG EC tablet Take 5 mg by mouth daily as needed for moderate constipation (usually twice per week).     Blood Glucose Monitoring Suppl (ONETOUCH VERIO) w/Device KIT Check blood sugars daily Dx E11.9 1 kit 0   diltiazem (CARDIZEM) 30 MG tablet Take 1 tablet (30 mg total) by mouth daily as needed. 30 tablet 6   feeding supplement (ENSURE ENLIVE / ENSURE PLUS) LIQD Take 237 mLs by mouth 3 (three) times daily between meals. 237 mL 12   furosemide (LASIX) 20 MG tablet Take 1 tablet (20 mg total) by mouth daily. 90 tablet 1   HYDROcodone bit-homatropine (HYCODAN) 5-1.5 MG/5ML syrup Take 5 mLs by mouth every 6 (six) hours as needed for cough. 120 mL 0   Lancet Devices (ONETOUCH DELICA PLUS LANCING) MISC 1 Device by Does not apply route daily. 1 each 0   Lancets (ONETOUCH DELICA PLUS EYCXKG81E) MISC USE TO CHECK BLOOD  SUGARS DAILY DX E11.9 100 each 3   levothyroxine (SYNTHROID) 75 MCG tablet Take 1 tablet (75 mcg total) by mouth daily. 90 tablet 1   metoprolol tartrate (LOPRESSOR) 100 MG tablet Take 1.5 tablets (150 mg total) by mouth 2 (two) times daily. 180 tablet 1   nitroGLYCERIN (NITROSTAT) 0.4 MG SL tablet Place 1 tablet (0.4 mg total) under the tongue every 5 (five) minutes as needed for chest pain. 25 tablet 1   nystatin (MYCOSTATIN/NYSTOP) powder Apply 1 application topically 3 (three) times daily. 15 g 0   ONETOUCH VERIO test strip CHECK BLOOD SUGARS DAILY DX E11.9 100 strip 3   polyethylene glycol (MIRALAX / GLYCOLAX) 17 g packet Take 17 g by mouth daily as needed for mild constipation.     sertraline (ZOLOFT) 50 MG tablet TAKE 1 TABLET BY MOUTH EVERY DAY 90 tablet 1   diltiazem (CARDIZEM) 60 MG tablet Take 1 tablet (60 mg total) by mouth 3 (three) times daily. 270 tablet 3   No current facility-administered medications for this visit.    Allergies:   Ace inhibitors, Feldene [piroxicam], Lexapro [escitalopram], Meloxicam, Prevnar [pneumococcal 13-val conj vacc], Statins, Sulfa antibiotics, and Zithromax [azithromycin dihydrate]    ROS:  Please see the history of present illness.   Otherwise, review of systems are positive for none.   All other systems are reviewed and negative.    PHYSICAL EXAM: VS:  BP 130/78 (BP Location: Right Arm, Patient Position: Sitting, Cuff Size: Normal)    Pulse 81    Wt 170 lb 6.4 oz (77.3 kg)    LMP 05/11/1992    SpO2 99%    BMI 30.19 kg/m  , BMI Body mass index is 30.19 kg/m. GENERAL:  Well appearing NECK:  No jugular venous distention, waveform within normal limits, carotid upstroke brisk and symmetric, no bruits, no thyromegaly LUNGS:  Clear to auscultation bilaterally CHEST:  Unremarkable HEART:  PMI not displaced or sustained,S1 and S2 within normal limits, no S3, no clicks, no rubs, no murmurs, irregular ABD:  Flat, positive bowel sounds normal in frequency  in pitch, no bruits, no rebound, no guarding, no midline pulsatile mass, no hepatomegaly, no splenomegaly EXT:  2 plus pulses throughout, no edema, no cyanosis no clubbing   EKG:  EKG is not ordered today. EKG 02/22/2021 with atrial fibrillation with rapid ventricular response, rate was 127, axis within normal limits, QTc slightly prolonged, RSR prime V1, no acute ST-T wave changes.  Recent  Labs: 02/22/2021: TSH 3.596 02/25/2021: Magnesium 1.9 03/07/2021: ALT 30; BUN 9; Creatinine, Ser 0.92; Hemoglobin 14.6; Platelets 354; Potassium 4.3; Sodium 147    Lipid Panel    Component Value Date/Time   CHOL 202 (H) 11/21/2020 1109   CHOL 100 05/07/2012 0957   TRIG 103 11/21/2020 1109   TRIG 148 06/21/2014 0932   TRIG 150 (H) 05/07/2012 0957   HDL 57 11/21/2020 1109   HDL 56 06/21/2014 0932   HDL 40 05/07/2012 0957   CHOLHDL 3.5 11/21/2020 1109   LDLCALC 127 (H) 11/21/2020 1109   LDLCALC 49 12/02/2013 0923   LDLCALC 30 05/07/2012 0957      Wt Readings from Last 3 Encounters:  03/27/21 170 lb 6.4 oz (77.3 kg)  03/07/21 167 lb (75.8 kg)  02/27/21 167 lb 5.3 oz (75.9 kg)      Other studies Reviewed: Additional studies/ records that were reviewed today include:   Hospital records Review of the above records demonstrates:  Please see elsewhere in the note.     ASSESSMENT AND PLAN:  Atrial fibrillation:    Ms. MARCIANNA DAILY has a CHA2DS2 - VASc score of 5.   She tolerates anticoagulation.  She seems to be now on a stable regimen of Cardizem that she can tolerate and swallow.  I will renew this along with 30 mg as needed which she is not really having to use very much.  Labs are up-to-date.  I reviewed these from the hospital record.    Coronary artery calcium:   She had a negative Lexiscan 2021.  She has some atypical nonanginal chest discomfort infrequently.  I talked about this with her daughter and if she has any change in this we could consider further ischemia imaging but I am not  suspecting unstable angina.  Hypertension:   The blood pressure is currently controlled.  No change in therapy.   Anxiety:   This seems to be better.  No change in therapy.  Current medicines are reviewed at length with the patient today.  The patient does not have concerns regarding medicines.  The following changes have been made:  None  Labs/ tests ordered today include: None  No orders of the defined types were placed in this encounter.     Disposition:   FU with me in 12 months.     Signed, Minus Breeding, MD  03/27/2021 11:35 AM    [facility]

## 2021-03-27 ENCOUNTER — Encounter: Payer: Self-pay | Admitting: Cardiology

## 2021-03-27 ENCOUNTER — Ambulatory Visit (INDEPENDENT_AMBULATORY_CARE_PROVIDER_SITE_OTHER): Payer: Medicare HMO | Admitting: Cardiology

## 2021-03-27 ENCOUNTER — Other Ambulatory Visit: Payer: Self-pay

## 2021-03-27 VITALS — BP 130/78 | HR 81 | Wt 170.4 lb

## 2021-03-27 DIAGNOSIS — I10 Essential (primary) hypertension: Secondary | ICD-10-CM

## 2021-03-27 DIAGNOSIS — Z6834 Body mass index (BMI) 34.0-34.9, adult: Secondary | ICD-10-CM | POA: Diagnosis not present

## 2021-03-27 DIAGNOSIS — I482 Chronic atrial fibrillation, unspecified: Secondary | ICD-10-CM

## 2021-03-27 DIAGNOSIS — E876 Hypokalemia: Secondary | ICD-10-CM | POA: Diagnosis not present

## 2021-03-27 DIAGNOSIS — F0284 Dementia in other diseases classified elsewhere, unspecified severity, with anxiety: Secondary | ICD-10-CM | POA: Diagnosis not present

## 2021-03-27 DIAGNOSIS — R931 Abnormal findings on diagnostic imaging of heart and coronary circulation: Secondary | ICD-10-CM | POA: Diagnosis not present

## 2021-03-27 DIAGNOSIS — D123 Benign neoplasm of transverse colon: Secondary | ICD-10-CM | POA: Diagnosis not present

## 2021-03-27 DIAGNOSIS — M8588 Other specified disorders of bone density and structure, other site: Secondary | ICD-10-CM | POA: Diagnosis not present

## 2021-03-27 DIAGNOSIS — D122 Benign neoplasm of ascending colon: Secondary | ICD-10-CM | POA: Diagnosis not present

## 2021-03-27 DIAGNOSIS — K582 Mixed irritable bowel syndrome: Secondary | ICD-10-CM | POA: Diagnosis not present

## 2021-03-27 DIAGNOSIS — D120 Benign neoplasm of cecum: Secondary | ICD-10-CM | POA: Diagnosis not present

## 2021-03-27 DIAGNOSIS — R768 Other specified abnormal immunological findings in serum: Secondary | ICD-10-CM | POA: Diagnosis not present

## 2021-03-27 DIAGNOSIS — F0283 Dementia in other diseases classified elsewhere, unspecified severity, with mood disturbance: Secondary | ICD-10-CM | POA: Diagnosis not present

## 2021-03-27 DIAGNOSIS — Z7901 Long term (current) use of anticoagulants: Secondary | ICD-10-CM | POA: Diagnosis not present

## 2021-03-27 DIAGNOSIS — E119 Type 2 diabetes mellitus without complications: Secondary | ICD-10-CM | POA: Diagnosis not present

## 2021-03-27 DIAGNOSIS — F32A Depression, unspecified: Secondary | ICD-10-CM | POA: Diagnosis not present

## 2021-03-27 DIAGNOSIS — E785 Hyperlipidemia, unspecified: Secondary | ICD-10-CM | POA: Diagnosis not present

## 2021-03-27 DIAGNOSIS — I4891 Unspecified atrial fibrillation: Secondary | ICD-10-CM | POA: Diagnosis not present

## 2021-03-27 DIAGNOSIS — D124 Benign neoplasm of descending colon: Secondary | ICD-10-CM | POA: Diagnosis not present

## 2021-03-27 DIAGNOSIS — E039 Hypothyroidism, unspecified: Secondary | ICD-10-CM | POA: Diagnosis not present

## 2021-03-27 DIAGNOSIS — F419 Anxiety disorder, unspecified: Secondary | ICD-10-CM | POA: Diagnosis not present

## 2021-03-27 DIAGNOSIS — R1310 Dysphagia, unspecified: Secondary | ICD-10-CM | POA: Diagnosis not present

## 2021-03-27 DIAGNOSIS — R69 Illness, unspecified: Secondary | ICD-10-CM | POA: Diagnosis not present

## 2021-03-27 DIAGNOSIS — U071 COVID-19: Secondary | ICD-10-CM | POA: Diagnosis not present

## 2021-03-27 DIAGNOSIS — R634 Abnormal weight loss: Secondary | ICD-10-CM | POA: Diagnosis not present

## 2021-03-27 MED ORDER — DILTIAZEM HCL 60 MG PO TABS: 60.0000 mg | ORAL_TABLET | Freq: Three times a day (TID) | ORAL | 3 refills | Status: DC

## 2021-03-27 MED ORDER — DILTIAZEM HCL 30 MG PO TABS
30.0000 mg | ORAL_TABLET | Freq: Every day | ORAL | 6 refills | Status: DC | PRN
Start: 2021-03-27 — End: 2021-05-07

## 2021-03-27 NOTE — Patient Instructions (Signed)
Medication Instructions:  Please take Diltiazem 60 mg once tablet three times a day. You may take Diltiazem 30 mg as needed. Continue all other medications as listed.  *If you need a refill on your cardiac medications before your next appointment, please call your pharmacy*  Follow-Up: At [facility], you and your health needs are our priority.  As part of our continuing mission to provide you with exceptional heart care, we have created designated Provider Care Teams.  These Care Teams include your primary Cardiologist (physician) and Advanced Practice Providers (APPs -  Physician Assistants and Nurse Practitioners) who all work together to provide you with the care you need, when you need it.  We recommend signing up for the patient portal called "MyChart".  Sign up information is provided on this After Visit Summary.  MyChart is used to connect with patients for Virtual Visits (Telemedicine).  Patients are able to view lab/test results, encounter notes, upcoming appointments, etc.  Non-urgent messages can be sent to your provider as well.   To learn more about what you can do with MyChart, go to [email or web address].    Your next appointment:   1 year(s)  The format for your next appointment:   In Person  Provider:   Minus Breeding, MD{   Thank you for choosing [facility]!!

## 2021-03-28 DIAGNOSIS — F32A Depression, unspecified: Secondary | ICD-10-CM | POA: Diagnosis not present

## 2021-03-28 DIAGNOSIS — R634 Abnormal weight loss: Secondary | ICD-10-CM | POA: Diagnosis not present

## 2021-03-28 DIAGNOSIS — E119 Type 2 diabetes mellitus without complications: Secondary | ICD-10-CM | POA: Diagnosis not present

## 2021-03-28 DIAGNOSIS — I4891 Unspecified atrial fibrillation: Secondary | ICD-10-CM | POA: Diagnosis not present

## 2021-03-28 DIAGNOSIS — D122 Benign neoplasm of ascending colon: Secondary | ICD-10-CM | POA: Diagnosis not present

## 2021-03-28 DIAGNOSIS — E876 Hypokalemia: Secondary | ICD-10-CM | POA: Diagnosis not present

## 2021-03-28 DIAGNOSIS — D124 Benign neoplasm of descending colon: Secondary | ICD-10-CM | POA: Diagnosis not present

## 2021-03-28 DIAGNOSIS — K582 Mixed irritable bowel syndrome: Secondary | ICD-10-CM | POA: Diagnosis not present

## 2021-03-28 DIAGNOSIS — R1310 Dysphagia, unspecified: Secondary | ICD-10-CM | POA: Diagnosis not present

## 2021-03-28 DIAGNOSIS — I10 Essential (primary) hypertension: Secondary | ICD-10-CM | POA: Diagnosis not present

## 2021-03-28 DIAGNOSIS — Z6834 Body mass index (BMI) 34.0-34.9, adult: Secondary | ICD-10-CM | POA: Diagnosis not present

## 2021-03-28 DIAGNOSIS — E785 Hyperlipidemia, unspecified: Secondary | ICD-10-CM | POA: Diagnosis not present

## 2021-03-28 DIAGNOSIS — M8588 Other specified disorders of bone density and structure, other site: Secondary | ICD-10-CM | POA: Diagnosis not present

## 2021-03-28 DIAGNOSIS — D123 Benign neoplasm of transverse colon: Secondary | ICD-10-CM | POA: Diagnosis not present

## 2021-03-28 DIAGNOSIS — F0283 Dementia in other diseases classified elsewhere, unspecified severity, with mood disturbance: Secondary | ICD-10-CM | POA: Diagnosis not present

## 2021-03-28 DIAGNOSIS — D120 Benign neoplasm of cecum: Secondary | ICD-10-CM | POA: Diagnosis not present

## 2021-03-28 DIAGNOSIS — Z7901 Long term (current) use of anticoagulants: Secondary | ICD-10-CM | POA: Diagnosis not present

## 2021-03-28 DIAGNOSIS — R931 Abnormal findings on diagnostic imaging of heart and coronary circulation: Secondary | ICD-10-CM | POA: Diagnosis not present

## 2021-03-28 DIAGNOSIS — U071 COVID-19: Secondary | ICD-10-CM | POA: Diagnosis not present

## 2021-03-28 DIAGNOSIS — E039 Hypothyroidism, unspecified: Secondary | ICD-10-CM | POA: Diagnosis not present

## 2021-03-28 DIAGNOSIS — F0284 Dementia in other diseases classified elsewhere, unspecified severity, with anxiety: Secondary | ICD-10-CM | POA: Diagnosis not present

## 2021-03-28 DIAGNOSIS — R768 Other specified abnormal immunological findings in serum: Secondary | ICD-10-CM | POA: Diagnosis not present

## 2021-04-01 DIAGNOSIS — E876 Hypokalemia: Secondary | ICD-10-CM | POA: Diagnosis not present

## 2021-04-01 DIAGNOSIS — K582 Mixed irritable bowel syndrome: Secondary | ICD-10-CM | POA: Diagnosis not present

## 2021-04-01 DIAGNOSIS — M8588 Other specified disorders of bone density and structure, other site: Secondary | ICD-10-CM | POA: Diagnosis not present

## 2021-04-01 DIAGNOSIS — D124 Benign neoplasm of descending colon: Secondary | ICD-10-CM | POA: Diagnosis not present

## 2021-04-01 DIAGNOSIS — R931 Abnormal findings on diagnostic imaging of heart and coronary circulation: Secondary | ICD-10-CM | POA: Diagnosis not present

## 2021-04-01 DIAGNOSIS — F0284 Dementia in other diseases classified elsewhere, unspecified severity, with anxiety: Secondary | ICD-10-CM | POA: Diagnosis not present

## 2021-04-01 DIAGNOSIS — Z6834 Body mass index (BMI) 34.0-34.9, adult: Secondary | ICD-10-CM | POA: Diagnosis not present

## 2021-04-01 DIAGNOSIS — R634 Abnormal weight loss: Secondary | ICD-10-CM | POA: Diagnosis not present

## 2021-04-01 DIAGNOSIS — E119 Type 2 diabetes mellitus without complications: Secondary | ICD-10-CM | POA: Diagnosis not present

## 2021-04-01 DIAGNOSIS — E039 Hypothyroidism, unspecified: Secondary | ICD-10-CM | POA: Diagnosis not present

## 2021-04-01 DIAGNOSIS — F32A Depression, unspecified: Secondary | ICD-10-CM | POA: Diagnosis not present

## 2021-04-01 DIAGNOSIS — D122 Benign neoplasm of ascending colon: Secondary | ICD-10-CM | POA: Diagnosis not present

## 2021-04-01 DIAGNOSIS — I4891 Unspecified atrial fibrillation: Secondary | ICD-10-CM | POA: Diagnosis not present

## 2021-04-01 DIAGNOSIS — R768 Other specified abnormal immunological findings in serum: Secondary | ICD-10-CM | POA: Diagnosis not present

## 2021-04-01 DIAGNOSIS — D120 Benign neoplasm of cecum: Secondary | ICD-10-CM | POA: Diagnosis not present

## 2021-04-01 DIAGNOSIS — D123 Benign neoplasm of transverse colon: Secondary | ICD-10-CM | POA: Diagnosis not present

## 2021-04-01 DIAGNOSIS — R1310 Dysphagia, unspecified: Secondary | ICD-10-CM | POA: Diagnosis not present

## 2021-04-01 DIAGNOSIS — E785 Hyperlipidemia, unspecified: Secondary | ICD-10-CM | POA: Diagnosis not present

## 2021-04-01 DIAGNOSIS — F0283 Dementia in other diseases classified elsewhere, unspecified severity, with mood disturbance: Secondary | ICD-10-CM | POA: Diagnosis not present

## 2021-04-01 DIAGNOSIS — I10 Essential (primary) hypertension: Secondary | ICD-10-CM | POA: Diagnosis not present

## 2021-04-01 DIAGNOSIS — Z7901 Long term (current) use of anticoagulants: Secondary | ICD-10-CM | POA: Diagnosis not present

## 2021-04-01 DIAGNOSIS — U071 COVID-19: Secondary | ICD-10-CM | POA: Diagnosis not present

## 2021-04-03 DIAGNOSIS — D124 Benign neoplasm of descending colon: Secondary | ICD-10-CM | POA: Diagnosis not present

## 2021-04-03 DIAGNOSIS — I4891 Unspecified atrial fibrillation: Secondary | ICD-10-CM | POA: Diagnosis not present

## 2021-04-03 DIAGNOSIS — R768 Other specified abnormal immunological findings in serum: Secondary | ICD-10-CM | POA: Diagnosis not present

## 2021-04-03 DIAGNOSIS — U071 COVID-19: Secondary | ICD-10-CM | POA: Diagnosis not present

## 2021-04-03 DIAGNOSIS — R1310 Dysphagia, unspecified: Secondary | ICD-10-CM | POA: Diagnosis not present

## 2021-04-03 DIAGNOSIS — R634 Abnormal weight loss: Secondary | ICD-10-CM | POA: Diagnosis not present

## 2021-04-03 DIAGNOSIS — D122 Benign neoplasm of ascending colon: Secondary | ICD-10-CM | POA: Diagnosis not present

## 2021-04-03 DIAGNOSIS — F0284 Dementia in other diseases classified elsewhere, unspecified severity, with anxiety: Secondary | ICD-10-CM | POA: Diagnosis not present

## 2021-04-03 DIAGNOSIS — F0283 Dementia in other diseases classified elsewhere, unspecified severity, with mood disturbance: Secondary | ICD-10-CM | POA: Diagnosis not present

## 2021-04-03 DIAGNOSIS — F32A Depression, unspecified: Secondary | ICD-10-CM | POA: Diagnosis not present

## 2021-04-03 DIAGNOSIS — D120 Benign neoplasm of cecum: Secondary | ICD-10-CM | POA: Diagnosis not present

## 2021-04-03 DIAGNOSIS — M8588 Other specified disorders of bone density and structure, other site: Secondary | ICD-10-CM | POA: Diagnosis not present

## 2021-04-03 DIAGNOSIS — D123 Benign neoplasm of transverse colon: Secondary | ICD-10-CM | POA: Diagnosis not present

## 2021-04-03 DIAGNOSIS — I10 Essential (primary) hypertension: Secondary | ICD-10-CM | POA: Diagnosis not present

## 2021-04-03 DIAGNOSIS — Z7901 Long term (current) use of anticoagulants: Secondary | ICD-10-CM | POA: Diagnosis not present

## 2021-04-03 DIAGNOSIS — E039 Hypothyroidism, unspecified: Secondary | ICD-10-CM | POA: Diagnosis not present

## 2021-04-03 DIAGNOSIS — E876 Hypokalemia: Secondary | ICD-10-CM | POA: Diagnosis not present

## 2021-04-03 DIAGNOSIS — E119 Type 2 diabetes mellitus without complications: Secondary | ICD-10-CM | POA: Diagnosis not present

## 2021-04-03 DIAGNOSIS — K582 Mixed irritable bowel syndrome: Secondary | ICD-10-CM | POA: Diagnosis not present

## 2021-04-03 DIAGNOSIS — Z6834 Body mass index (BMI) 34.0-34.9, adult: Secondary | ICD-10-CM | POA: Diagnosis not present

## 2021-04-03 DIAGNOSIS — R931 Abnormal findings on diagnostic imaging of heart and coronary circulation: Secondary | ICD-10-CM | POA: Diagnosis not present

## 2021-04-03 DIAGNOSIS — E785 Hyperlipidemia, unspecified: Secondary | ICD-10-CM | POA: Diagnosis not present

## 2021-04-05 ENCOUNTER — Telehealth: Payer: Self-pay | Admitting: Nurse Practitioner

## 2021-04-05 DIAGNOSIS — D120 Benign neoplasm of cecum: Secondary | ICD-10-CM | POA: Diagnosis not present

## 2021-04-05 DIAGNOSIS — E876 Hypokalemia: Secondary | ICD-10-CM | POA: Diagnosis not present

## 2021-04-05 DIAGNOSIS — D124 Benign neoplasm of descending colon: Secondary | ICD-10-CM | POA: Diagnosis not present

## 2021-04-05 DIAGNOSIS — U071 COVID-19: Secondary | ICD-10-CM | POA: Diagnosis not present

## 2021-04-05 DIAGNOSIS — K582 Mixed irritable bowel syndrome: Secondary | ICD-10-CM | POA: Diagnosis not present

## 2021-04-05 DIAGNOSIS — D122 Benign neoplasm of ascending colon: Secondary | ICD-10-CM | POA: Diagnosis not present

## 2021-04-05 DIAGNOSIS — R768 Other specified abnormal immunological findings in serum: Secondary | ICD-10-CM | POA: Diagnosis not present

## 2021-04-05 DIAGNOSIS — R1310 Dysphagia, unspecified: Secondary | ICD-10-CM | POA: Diagnosis not present

## 2021-04-05 DIAGNOSIS — R634 Abnormal weight loss: Secondary | ICD-10-CM | POA: Diagnosis not present

## 2021-04-05 DIAGNOSIS — R931 Abnormal findings on diagnostic imaging of heart and coronary circulation: Secondary | ICD-10-CM | POA: Diagnosis not present

## 2021-04-05 DIAGNOSIS — F0284 Dementia in other diseases classified elsewhere, unspecified severity, with anxiety: Secondary | ICD-10-CM | POA: Diagnosis not present

## 2021-04-05 DIAGNOSIS — I4891 Unspecified atrial fibrillation: Secondary | ICD-10-CM | POA: Diagnosis not present

## 2021-04-05 DIAGNOSIS — F32A Depression, unspecified: Secondary | ICD-10-CM | POA: Diagnosis not present

## 2021-04-05 DIAGNOSIS — D123 Benign neoplasm of transverse colon: Secondary | ICD-10-CM | POA: Diagnosis not present

## 2021-04-05 DIAGNOSIS — E119 Type 2 diabetes mellitus without complications: Secondary | ICD-10-CM | POA: Diagnosis not present

## 2021-04-05 DIAGNOSIS — E785 Hyperlipidemia, unspecified: Secondary | ICD-10-CM | POA: Diagnosis not present

## 2021-04-05 DIAGNOSIS — I10 Essential (primary) hypertension: Secondary | ICD-10-CM | POA: Diagnosis not present

## 2021-04-05 DIAGNOSIS — E039 Hypothyroidism, unspecified: Secondary | ICD-10-CM | POA: Diagnosis not present

## 2021-04-05 DIAGNOSIS — Z6834 Body mass index (BMI) 34.0-34.9, adult: Secondary | ICD-10-CM | POA: Diagnosis not present

## 2021-04-05 DIAGNOSIS — M8588 Other specified disorders of bone density and structure, other site: Secondary | ICD-10-CM | POA: Diagnosis not present

## 2021-04-05 DIAGNOSIS — F0283 Dementia in other diseases classified elsewhere, unspecified severity, with mood disturbance: Secondary | ICD-10-CM | POA: Diagnosis not present

## 2021-04-05 DIAGNOSIS — Z7901 Long term (current) use of anticoagulants: Secondary | ICD-10-CM | POA: Diagnosis not present

## 2021-04-05 NOTE — Telephone Encounter (Signed)
FYI

## 2021-04-08 ENCOUNTER — Telehealth: Payer: Self-pay | Admitting: Nurse Practitioner

## 2021-04-08 ENCOUNTER — Other Ambulatory Visit: Payer: Self-pay | Admitting: Nurse Practitioner

## 2021-04-08 DIAGNOSIS — F0283 Dementia in other diseases classified elsewhere, unspecified severity, with mood disturbance: Secondary | ICD-10-CM | POA: Diagnosis not present

## 2021-04-08 DIAGNOSIS — E119 Type 2 diabetes mellitus without complications: Secondary | ICD-10-CM | POA: Diagnosis not present

## 2021-04-08 DIAGNOSIS — K582 Mixed irritable bowel syndrome: Secondary | ICD-10-CM | POA: Diagnosis not present

## 2021-04-08 DIAGNOSIS — R931 Abnormal findings on diagnostic imaging of heart and coronary circulation: Secondary | ICD-10-CM | POA: Diagnosis not present

## 2021-04-08 DIAGNOSIS — F32A Depression, unspecified: Secondary | ICD-10-CM | POA: Diagnosis not present

## 2021-04-08 DIAGNOSIS — R634 Abnormal weight loss: Secondary | ICD-10-CM | POA: Diagnosis not present

## 2021-04-08 DIAGNOSIS — F0284 Dementia in other diseases classified elsewhere, unspecified severity, with anxiety: Secondary | ICD-10-CM | POA: Diagnosis not present

## 2021-04-08 DIAGNOSIS — D120 Benign neoplasm of cecum: Secondary | ICD-10-CM | POA: Diagnosis not present

## 2021-04-08 DIAGNOSIS — D122 Benign neoplasm of ascending colon: Secondary | ICD-10-CM | POA: Diagnosis not present

## 2021-04-08 DIAGNOSIS — I4891 Unspecified atrial fibrillation: Secondary | ICD-10-CM | POA: Diagnosis not present

## 2021-04-08 DIAGNOSIS — E785 Hyperlipidemia, unspecified: Secondary | ICD-10-CM | POA: Diagnosis not present

## 2021-04-08 DIAGNOSIS — E039 Hypothyroidism, unspecified: Secondary | ICD-10-CM | POA: Diagnosis not present

## 2021-04-08 DIAGNOSIS — E876 Hypokalemia: Secondary | ICD-10-CM | POA: Diagnosis not present

## 2021-04-08 DIAGNOSIS — R1310 Dysphagia, unspecified: Secondary | ICD-10-CM | POA: Diagnosis not present

## 2021-04-08 DIAGNOSIS — Z7901 Long term (current) use of anticoagulants: Secondary | ICD-10-CM | POA: Diagnosis not present

## 2021-04-08 DIAGNOSIS — M8588 Other specified disorders of bone density and structure, other site: Secondary | ICD-10-CM | POA: Diagnosis not present

## 2021-04-08 DIAGNOSIS — U071 COVID-19: Secondary | ICD-10-CM | POA: Diagnosis not present

## 2021-04-08 DIAGNOSIS — R768 Other specified abnormal immunological findings in serum: Secondary | ICD-10-CM | POA: Diagnosis not present

## 2021-04-08 DIAGNOSIS — I10 Essential (primary) hypertension: Secondary | ICD-10-CM | POA: Diagnosis not present

## 2021-04-08 DIAGNOSIS — D124 Benign neoplasm of descending colon: Secondary | ICD-10-CM | POA: Diagnosis not present

## 2021-04-08 DIAGNOSIS — Z6834 Body mass index (BMI) 34.0-34.9, adult: Secondary | ICD-10-CM | POA: Diagnosis not present

## 2021-04-08 DIAGNOSIS — D123 Benign neoplasm of transverse colon: Secondary | ICD-10-CM | POA: Diagnosis not present

## 2021-04-08 MED ORDER — OMEPRAZOLE MAGNESIUM 20 MG PO TBEC: 20.0000 mg | DELAYED_RELEASE_TABLET | Freq: Every day | ORAL | 1 refills | Status: DC

## 2021-04-08 NOTE — Telephone Encounter (Signed)
Veronica Paul states that patient has trouble describing pain states that her stomach hurts and then will say her throat hurts. The one time they gave her tums it did relief the pain. It happens 2-3 times a week. She is asking if a prescription can be called in for something she can take as needed or just use the tums? They are not wanting something for everyday. Please advise

## 2021-04-08 NOTE — Telephone Encounter (Signed)
Patient aware and verbalized understanding. °

## 2021-04-08 NOTE — Telephone Encounter (Signed)
Will send in omeprazole and see if helps- can take on as needed basis

## 2021-04-08 NOTE — Telephone Encounter (Signed)
Pepcid , tums and omperazole are all fine. Where is pain- upper abdomen or lower abdomen?

## 2021-04-09 ENCOUNTER — Other Ambulatory Visit (HOSPITAL_COMMUNITY): Payer: Self-pay

## 2021-04-09 DIAGNOSIS — E785 Hyperlipidemia, unspecified: Secondary | ICD-10-CM | POA: Diagnosis not present

## 2021-04-09 DIAGNOSIS — U071 COVID-19: Secondary | ICD-10-CM | POA: Diagnosis not present

## 2021-04-09 DIAGNOSIS — F0283 Dementia in other diseases classified elsewhere, unspecified severity, with mood disturbance: Secondary | ICD-10-CM | POA: Diagnosis not present

## 2021-04-09 DIAGNOSIS — D123 Benign neoplasm of transverse colon: Secondary | ICD-10-CM | POA: Diagnosis not present

## 2021-04-09 DIAGNOSIS — F32A Depression, unspecified: Secondary | ICD-10-CM | POA: Diagnosis not present

## 2021-04-09 DIAGNOSIS — E119 Type 2 diabetes mellitus without complications: Secondary | ICD-10-CM | POA: Diagnosis not present

## 2021-04-09 DIAGNOSIS — R634 Abnormal weight loss: Secondary | ICD-10-CM | POA: Diagnosis not present

## 2021-04-09 DIAGNOSIS — R768 Other specified abnormal immunological findings in serum: Secondary | ICD-10-CM | POA: Diagnosis not present

## 2021-04-09 DIAGNOSIS — R931 Abnormal findings on diagnostic imaging of heart and coronary circulation: Secondary | ICD-10-CM | POA: Diagnosis not present

## 2021-04-09 DIAGNOSIS — I10 Essential (primary) hypertension: Secondary | ICD-10-CM | POA: Diagnosis not present

## 2021-04-09 DIAGNOSIS — D120 Benign neoplasm of cecum: Secondary | ICD-10-CM | POA: Diagnosis not present

## 2021-04-09 DIAGNOSIS — Z7901 Long term (current) use of anticoagulants: Secondary | ICD-10-CM | POA: Diagnosis not present

## 2021-04-09 DIAGNOSIS — M8588 Other specified disorders of bone density and structure, other site: Secondary | ICD-10-CM | POA: Diagnosis not present

## 2021-04-09 DIAGNOSIS — Z6834 Body mass index (BMI) 34.0-34.9, adult: Secondary | ICD-10-CM | POA: Diagnosis not present

## 2021-04-09 DIAGNOSIS — D124 Benign neoplasm of descending colon: Secondary | ICD-10-CM | POA: Diagnosis not present

## 2021-04-09 DIAGNOSIS — E039 Hypothyroidism, unspecified: Secondary | ICD-10-CM | POA: Diagnosis not present

## 2021-04-09 DIAGNOSIS — D122 Benign neoplasm of ascending colon: Secondary | ICD-10-CM | POA: Diagnosis not present

## 2021-04-09 DIAGNOSIS — R1310 Dysphagia, unspecified: Secondary | ICD-10-CM | POA: Diagnosis not present

## 2021-04-09 DIAGNOSIS — E876 Hypokalemia: Secondary | ICD-10-CM | POA: Diagnosis not present

## 2021-04-09 DIAGNOSIS — I4891 Unspecified atrial fibrillation: Secondary | ICD-10-CM | POA: Diagnosis not present

## 2021-04-09 DIAGNOSIS — F0284 Dementia in other diseases classified elsewhere, unspecified severity, with anxiety: Secondary | ICD-10-CM | POA: Diagnosis not present

## 2021-04-09 DIAGNOSIS — K582 Mixed irritable bowel syndrome: Secondary | ICD-10-CM | POA: Diagnosis not present

## 2021-04-09 NOTE — Telephone Encounter (Signed)
She can take omeprazole as needed- only reason says daily is so yu can get more for same proice. Yes do not take at sametime as synthroid

## 2021-04-10 ENCOUNTER — Ambulatory Visit: Payer: Medicare HMO | Admitting: Cardiology

## 2021-04-10 DIAGNOSIS — Z6834 Body mass index (BMI) 34.0-34.9, adult: Secondary | ICD-10-CM | POA: Diagnosis not present

## 2021-04-10 DIAGNOSIS — E785 Hyperlipidemia, unspecified: Secondary | ICD-10-CM | POA: Diagnosis not present

## 2021-04-10 DIAGNOSIS — R931 Abnormal findings on diagnostic imaging of heart and coronary circulation: Secondary | ICD-10-CM | POA: Diagnosis not present

## 2021-04-10 DIAGNOSIS — D123 Benign neoplasm of transverse colon: Secondary | ICD-10-CM | POA: Diagnosis not present

## 2021-04-10 DIAGNOSIS — D120 Benign neoplasm of cecum: Secondary | ICD-10-CM | POA: Diagnosis not present

## 2021-04-10 DIAGNOSIS — D122 Benign neoplasm of ascending colon: Secondary | ICD-10-CM | POA: Diagnosis not present

## 2021-04-10 DIAGNOSIS — E876 Hypokalemia: Secondary | ICD-10-CM | POA: Diagnosis not present

## 2021-04-10 DIAGNOSIS — M8588 Other specified disorders of bone density and structure, other site: Secondary | ICD-10-CM | POA: Diagnosis not present

## 2021-04-10 DIAGNOSIS — U071 COVID-19: Secondary | ICD-10-CM | POA: Diagnosis not present

## 2021-04-10 DIAGNOSIS — R768 Other specified abnormal immunological findings in serum: Secondary | ICD-10-CM | POA: Diagnosis not present

## 2021-04-10 DIAGNOSIS — D124 Benign neoplasm of descending colon: Secondary | ICD-10-CM | POA: Diagnosis not present

## 2021-04-10 DIAGNOSIS — E039 Hypothyroidism, unspecified: Secondary | ICD-10-CM | POA: Diagnosis not present

## 2021-04-10 DIAGNOSIS — Z7901 Long term (current) use of anticoagulants: Secondary | ICD-10-CM | POA: Diagnosis not present

## 2021-04-10 DIAGNOSIS — F32A Depression, unspecified: Secondary | ICD-10-CM | POA: Diagnosis not present

## 2021-04-10 DIAGNOSIS — I10 Essential (primary) hypertension: Secondary | ICD-10-CM | POA: Diagnosis not present

## 2021-04-10 DIAGNOSIS — I4891 Unspecified atrial fibrillation: Secondary | ICD-10-CM | POA: Diagnosis not present

## 2021-04-10 DIAGNOSIS — R634 Abnormal weight loss: Secondary | ICD-10-CM | POA: Diagnosis not present

## 2021-04-10 DIAGNOSIS — K582 Mixed irritable bowel syndrome: Secondary | ICD-10-CM | POA: Diagnosis not present

## 2021-04-10 DIAGNOSIS — F0284 Dementia in other diseases classified elsewhere, unspecified severity, with anxiety: Secondary | ICD-10-CM | POA: Diagnosis not present

## 2021-04-10 DIAGNOSIS — R1310 Dysphagia, unspecified: Secondary | ICD-10-CM | POA: Diagnosis not present

## 2021-04-10 DIAGNOSIS — F0283 Dementia in other diseases classified elsewhere, unspecified severity, with mood disturbance: Secondary | ICD-10-CM | POA: Diagnosis not present

## 2021-04-10 DIAGNOSIS — E119 Type 2 diabetes mellitus without complications: Secondary | ICD-10-CM | POA: Diagnosis not present

## 2021-04-11 ENCOUNTER — Other Ambulatory Visit: Payer: Self-pay | Admitting: Nurse Practitioner

## 2021-04-16 DIAGNOSIS — E039 Hypothyroidism, unspecified: Secondary | ICD-10-CM | POA: Diagnosis not present

## 2021-04-16 DIAGNOSIS — I10 Essential (primary) hypertension: Secondary | ICD-10-CM | POA: Diagnosis not present

## 2021-04-16 DIAGNOSIS — D120 Benign neoplasm of cecum: Secondary | ICD-10-CM | POA: Diagnosis not present

## 2021-04-16 DIAGNOSIS — R1310 Dysphagia, unspecified: Secondary | ICD-10-CM | POA: Diagnosis not present

## 2021-04-16 DIAGNOSIS — M8588 Other specified disorders of bone density and structure, other site: Secondary | ICD-10-CM | POA: Diagnosis not present

## 2021-04-16 DIAGNOSIS — R768 Other specified abnormal immunological findings in serum: Secondary | ICD-10-CM | POA: Diagnosis not present

## 2021-04-16 DIAGNOSIS — E785 Hyperlipidemia, unspecified: Secondary | ICD-10-CM | POA: Diagnosis not present

## 2021-04-16 DIAGNOSIS — Z7901 Long term (current) use of anticoagulants: Secondary | ICD-10-CM | POA: Diagnosis not present

## 2021-04-16 DIAGNOSIS — F32A Depression, unspecified: Secondary | ICD-10-CM | POA: Diagnosis not present

## 2021-04-16 DIAGNOSIS — R634 Abnormal weight loss: Secondary | ICD-10-CM | POA: Diagnosis not present

## 2021-04-16 DIAGNOSIS — F0284 Dementia in other diseases classified elsewhere, unspecified severity, with anxiety: Secondary | ICD-10-CM | POA: Diagnosis not present

## 2021-04-16 DIAGNOSIS — E876 Hypokalemia: Secondary | ICD-10-CM | POA: Diagnosis not present

## 2021-04-16 DIAGNOSIS — Z6834 Body mass index (BMI) 34.0-34.9, adult: Secondary | ICD-10-CM | POA: Diagnosis not present

## 2021-04-16 DIAGNOSIS — D124 Benign neoplasm of descending colon: Secondary | ICD-10-CM | POA: Diagnosis not present

## 2021-04-16 DIAGNOSIS — R931 Abnormal findings on diagnostic imaging of heart and coronary circulation: Secondary | ICD-10-CM | POA: Diagnosis not present

## 2021-04-16 DIAGNOSIS — U071 COVID-19: Secondary | ICD-10-CM | POA: Diagnosis not present

## 2021-04-16 DIAGNOSIS — E119 Type 2 diabetes mellitus without complications: Secondary | ICD-10-CM | POA: Diagnosis not present

## 2021-04-16 DIAGNOSIS — I4891 Unspecified atrial fibrillation: Secondary | ICD-10-CM | POA: Diagnosis not present

## 2021-04-16 DIAGNOSIS — F0283 Dementia in other diseases classified elsewhere, unspecified severity, with mood disturbance: Secondary | ICD-10-CM | POA: Diagnosis not present

## 2021-04-16 DIAGNOSIS — K582 Mixed irritable bowel syndrome: Secondary | ICD-10-CM | POA: Diagnosis not present

## 2021-04-16 DIAGNOSIS — D123 Benign neoplasm of transverse colon: Secondary | ICD-10-CM | POA: Diagnosis not present

## 2021-04-16 DIAGNOSIS — D122 Benign neoplasm of ascending colon: Secondary | ICD-10-CM | POA: Diagnosis not present

## 2021-04-17 DIAGNOSIS — D122 Benign neoplasm of ascending colon: Secondary | ICD-10-CM | POA: Diagnosis not present

## 2021-04-17 DIAGNOSIS — Z7901 Long term (current) use of anticoagulants: Secondary | ICD-10-CM | POA: Diagnosis not present

## 2021-04-17 DIAGNOSIS — E119 Type 2 diabetes mellitus without complications: Secondary | ICD-10-CM | POA: Diagnosis not present

## 2021-04-17 DIAGNOSIS — D123 Benign neoplasm of transverse colon: Secondary | ICD-10-CM | POA: Diagnosis not present

## 2021-04-17 DIAGNOSIS — R634 Abnormal weight loss: Secondary | ICD-10-CM | POA: Diagnosis not present

## 2021-04-17 DIAGNOSIS — E039 Hypothyroidism, unspecified: Secondary | ICD-10-CM | POA: Diagnosis not present

## 2021-04-17 DIAGNOSIS — D120 Benign neoplasm of cecum: Secondary | ICD-10-CM | POA: Diagnosis not present

## 2021-04-17 DIAGNOSIS — R768 Other specified abnormal immunological findings in serum: Secondary | ICD-10-CM | POA: Diagnosis not present

## 2021-04-17 DIAGNOSIS — R1310 Dysphagia, unspecified: Secondary | ICD-10-CM | POA: Diagnosis not present

## 2021-04-17 DIAGNOSIS — F0283 Dementia in other diseases classified elsewhere, unspecified severity, with mood disturbance: Secondary | ICD-10-CM | POA: Diagnosis not present

## 2021-04-17 DIAGNOSIS — F32A Depression, unspecified: Secondary | ICD-10-CM | POA: Diagnosis not present

## 2021-04-17 DIAGNOSIS — Z6834 Body mass index (BMI) 34.0-34.9, adult: Secondary | ICD-10-CM | POA: Diagnosis not present

## 2021-04-17 DIAGNOSIS — E785 Hyperlipidemia, unspecified: Secondary | ICD-10-CM | POA: Diagnosis not present

## 2021-04-17 DIAGNOSIS — F0284 Dementia in other diseases classified elsewhere, unspecified severity, with anxiety: Secondary | ICD-10-CM | POA: Diagnosis not present

## 2021-04-17 DIAGNOSIS — U071 COVID-19: Secondary | ICD-10-CM | POA: Diagnosis not present

## 2021-04-17 DIAGNOSIS — R931 Abnormal findings on diagnostic imaging of heart and coronary circulation: Secondary | ICD-10-CM | POA: Diagnosis not present

## 2021-04-17 DIAGNOSIS — I10 Essential (primary) hypertension: Secondary | ICD-10-CM | POA: Diagnosis not present

## 2021-04-17 DIAGNOSIS — E876 Hypokalemia: Secondary | ICD-10-CM | POA: Diagnosis not present

## 2021-04-17 DIAGNOSIS — K582 Mixed irritable bowel syndrome: Secondary | ICD-10-CM | POA: Diagnosis not present

## 2021-04-17 DIAGNOSIS — M8588 Other specified disorders of bone density and structure, other site: Secondary | ICD-10-CM | POA: Diagnosis not present

## 2021-04-17 DIAGNOSIS — I4891 Unspecified atrial fibrillation: Secondary | ICD-10-CM | POA: Diagnosis not present

## 2021-04-17 DIAGNOSIS — D124 Benign neoplasm of descending colon: Secondary | ICD-10-CM | POA: Diagnosis not present

## 2021-04-18 ENCOUNTER — Ambulatory Visit (INDEPENDENT_AMBULATORY_CARE_PROVIDER_SITE_OTHER): Payer: Medicare HMO | Admitting: Family Medicine

## 2021-04-18 DIAGNOSIS — I10 Essential (primary) hypertension: Secondary | ICD-10-CM | POA: Diagnosis not present

## 2021-04-18 DIAGNOSIS — R768 Other specified abnormal immunological findings in serum: Secondary | ICD-10-CM | POA: Diagnosis not present

## 2021-04-18 DIAGNOSIS — D124 Benign neoplasm of descending colon: Secondary | ICD-10-CM | POA: Diagnosis not present

## 2021-04-18 DIAGNOSIS — R410 Disorientation, unspecified: Secondary | ICD-10-CM

## 2021-04-18 DIAGNOSIS — Z7901 Long term (current) use of anticoagulants: Secondary | ICD-10-CM | POA: Diagnosis not present

## 2021-04-18 DIAGNOSIS — D120 Benign neoplasm of cecum: Secondary | ICD-10-CM | POA: Diagnosis not present

## 2021-04-18 DIAGNOSIS — R634 Abnormal weight loss: Secondary | ICD-10-CM | POA: Diagnosis not present

## 2021-04-18 DIAGNOSIS — R1310 Dysphagia, unspecified: Secondary | ICD-10-CM | POA: Diagnosis not present

## 2021-04-18 DIAGNOSIS — D123 Benign neoplasm of transverse colon: Secondary | ICD-10-CM | POA: Diagnosis not present

## 2021-04-18 DIAGNOSIS — I4891 Unspecified atrial fibrillation: Secondary | ICD-10-CM | POA: Diagnosis not present

## 2021-04-18 DIAGNOSIS — E876 Hypokalemia: Secondary | ICD-10-CM | POA: Diagnosis not present

## 2021-04-18 DIAGNOSIS — F32A Depression, unspecified: Secondary | ICD-10-CM | POA: Diagnosis not present

## 2021-04-18 DIAGNOSIS — R931 Abnormal findings on diagnostic imaging of heart and coronary circulation: Secondary | ICD-10-CM | POA: Diagnosis not present

## 2021-04-18 DIAGNOSIS — E785 Hyperlipidemia, unspecified: Secondary | ICD-10-CM | POA: Diagnosis not present

## 2021-04-18 DIAGNOSIS — R82998 Other abnormal findings in urine: Secondary | ICD-10-CM | POA: Diagnosis not present

## 2021-04-18 DIAGNOSIS — D122 Benign neoplasm of ascending colon: Secondary | ICD-10-CM | POA: Diagnosis not present

## 2021-04-18 DIAGNOSIS — E039 Hypothyroidism, unspecified: Secondary | ICD-10-CM | POA: Diagnosis not present

## 2021-04-18 DIAGNOSIS — U071 COVID-19: Secondary | ICD-10-CM | POA: Diagnosis not present

## 2021-04-18 DIAGNOSIS — F0283 Dementia in other diseases classified elsewhere, unspecified severity, with mood disturbance: Secondary | ICD-10-CM | POA: Diagnosis not present

## 2021-04-18 DIAGNOSIS — K582 Mixed irritable bowel syndrome: Secondary | ICD-10-CM | POA: Diagnosis not present

## 2021-04-18 DIAGNOSIS — M8588 Other specified disorders of bone density and structure, other site: Secondary | ICD-10-CM | POA: Diagnosis not present

## 2021-04-18 DIAGNOSIS — Z6834 Body mass index (BMI) 34.0-34.9, adult: Secondary | ICD-10-CM | POA: Diagnosis not present

## 2021-04-18 DIAGNOSIS — E119 Type 2 diabetes mellitus without complications: Secondary | ICD-10-CM | POA: Diagnosis not present

## 2021-04-18 DIAGNOSIS — F0284 Dementia in other diseases classified elsewhere, unspecified severity, with anxiety: Secondary | ICD-10-CM | POA: Diagnosis not present

## 2021-04-18 LAB — MICROSCOPIC EXAMINATION
RBC, Urine: NONE SEEN /hpf (ref 0–2)
Renal Epithel, UA: NONE SEEN /hpf

## 2021-04-18 LAB — URINALYSIS, ROUTINE W REFLEX MICROSCOPIC
Bilirubin, UA: NEGATIVE
Glucose, UA: NEGATIVE
Ketones, UA: NEGATIVE
Nitrite, UA: NEGATIVE
Specific Gravity, UA: <1.005 — ABNORMAL LOW (ref 1.005–1.030)
Urobilinogen, Ur: 0.2 mg/dL (ref 0.2–1.0)
pH, UA: 6 (ref 5.0–7.5)

## 2021-04-18 NOTE — Progress Notes (Signed)
Telephone visit ? ?Subjective: ?CC: UTI ?PCP: Chevis Pretty, FNP ?BHA:LPFX Veronica Paul is a 86 y.o. female calls for telephone consult today. Patient provides verbal consent for consult held via phone. ? ?Due to COVID-19 pandemic this visit was conducted virtually. This visit type was conducted due to national recommendations for restrictions regarding the COVID-19 Pandemic (e.g. social distancing, sheltering in place) in an effort to limit this patient's exposure and mitigate transmission in our community. All issues noted in this document were discussed and addressed.  A physical exam was not performed with this format.  ? ?Location of patient: home ?Location of provider: [facility] ?Others present for call: daughter ? ?1. UTI ?Daughter reports that patient was in hospital in January for dehydration and COVID-19.  She has been undergoing PT and the therapist noticed a change in her.  She seems more lethargic and disoriented.  Patient reports urine was darker than usual.  No fever.  ? ? ?ROS: Per HPI ? ?Allergies  ?Allergen Reactions  ? Ace Inhibitors Cough  ? Feldene [Piroxicam]   ?  REACTION: RASH TO HANDS  ? Lexapro [Escitalopram]   ? Meloxicam Nausea And Vomiting  ? Prevnar [Pneumococcal 13-Val Conj Vacc]   ? Statins Other (See Comments)  ?  myalgia  ? Sulfa Antibiotics   ?  Rash ?  ? Zithromax [Azithromycin Dihydrate] Itching  ? ?Past Medical History:  ?Diagnosis Date  ? Allergy   ? "my nose runs alot"  ? Anxiety   ? Arthritis   ? Atrial fibrillation ([place])   ? Blood transfusion without reported diagnosis   ? "when I had a miscarrage in 1957"  ? Cataract   ? bilateral removed  ? Cholelithiasis   ? Constipation   ? x ray showed increased stool in colon- uses Miralax daily and Dulcolax twice a week   ? Depression   ? Diabetes mellitus   ? Dyslipidemia   ? HTN (hypertension)   ? Hx of long-term (current) use of anticoagulants   ? Hyperlipidemia   ? Hypertension   ? Hypothyroidism   ? Lung cancer ([place])   ? Obesity    ? Osteopenia   ? Vitamin D deficiency   ? ? ?Current Outpatient Medications:  ?  acetaminophen (TYLENOL) 500 MG tablet, Take 500 mg by mouth every 6 (six) hours as needed for moderate pain, headache or fever., Disp: , Rfl:  ?  apixaban (ELIQUIS) 5 MG TABS tablet, Take 1 tablet (5 mg total) by mouth 2 (two) times daily., Disp: 180 tablet, Rfl: 1 ?  bisacodyl (DULCOLAX) 5 MG EC tablet, Take 5 mg by mouth daily as needed for moderate constipation (usually twice per week)., Disp: , Rfl:  ?  Blood Glucose Monitoring Suppl (ONETOUCH VERIO) w/Device KIT, Check blood sugars daily Dx E11.9, Disp: 1 kit, Rfl: 0 ?  diltiazem (CARDIZEM) 30 MG tablet, Take 1 tablet (30 mg total) by mouth daily as needed., Disp: 30 tablet, Rfl: 6 ?  diltiazem (CARDIZEM) 60 MG tablet, Take 1 tablet (60 mg total) by mouth 3 (three) times daily., Disp: 270 tablet, Rfl: 3 ?  feeding supplement (ENSURE ENLIVE / ENSURE PLUS) LIQD, Take 237 mLs by mouth 3 (three) times daily between meals., Disp: 237 mL, Rfl: 12 ?  furosemide (LASIX) 20 MG tablet, Take 1 tablet (20 mg total) by mouth daily., Disp: 90 tablet, Rfl: 1 ?  HYDROcodone bit-homatropine (HYCODAN) 5-1.5 MG/5ML syrup, Take 5 mLs by mouth every 6 (six) hours as needed for cough.,  Disp: 120 mL, Rfl: 0 ?  Lancet Devices (ONETOUCH DELICA PLUS LANCING) MISC, 1 Device by Does not apply route daily., Disp: 1 each, Rfl: 0 ?  Lancets (ONETOUCH DELICA PLUS [id number]) MISC, USE TO CHECK BLOOD SUGARS DAILY DX E11.9, Disp: 100 each, Rfl: 3 ?  levothyroxine (SYNTHROID) 75 MCG tablet, Take 1 tablet (75 mcg total) by mouth daily., Disp: 90 tablet, Rfl: 1 ?  metoprolol tartrate (LOPRESSOR) 100 MG tablet, Take 1.5 tablets (150 mg total) by mouth 2 (two) times daily., Disp: 180 tablet, Rfl: 1 ?  nitroGLYCERIN (NITROSTAT) 0.4 MG SL tablet, Place 1 tablet (0.4 mg total) under the tongue every 5 (five) minutes as needed for chest pain., Disp: 25 tablet, Rfl: 1 ?  nystatin (MYCOSTATIN/NYSTOP) powder, Apply 1  application topically 3 (three) times daily., Disp: 15 g, Rfl: 0 ?  omeprazole (PRILOSEC OTC) 20 MG tablet, Take 1 tablet (20 mg total) by mouth daily., Disp: 28 tablet, Rfl: 1 ?  ONETOUCH VERIO test strip, CHECK BLOOD SUGARS DAILY DX E11.9, Disp: 100 strip, Rfl: 3 ?  polyethylene glycol (MIRALAX / GLYCOLAX) 17 g packet, Take 17 g by mouth daily as needed for mild constipation., Disp: , Rfl:  ?  sertraline (ZOLOFT) 50 MG tablet, TAKE 1 TABLET BY MOUTH EVERY DAY, Disp: 90 tablet, Rfl: 1 ? ?Assessment/ Plan: ?86 y.o. female  ? ?Disorientated - Plan: Urinalysis, Routine w reflex microscopic, Urine Culture ? ?Dark urine - Plan: Urinalysis, Routine w reflex microscopic, Urine Culture ? ?I do wonder if some of the disorientation may be relative to recent illness.  I asked that they push oral fluids as she did have some dehydration with her illness.  Her urinalysis shows very small amounts of bacteria under microscopy but really no other telltale signs of infection.  I have gone ahead and sent in for culture to ensure that we are not missing an insidious infection.  We discussed red flags and return precautions with her daughter.  We will await the urine culture before starting any type of antibiotic ? ?Start time: 8:52am; 10:10am results and plan ?End time: 8:56am; 10:12am ? ?Total time spent on patient care (including telephone call/ virtual visit): 6 minutes ? ?Janora Norlander, DO ?[facility] ?[phone] ? ?

## 2021-04-20 LAB — URINE CULTURE

## 2021-04-22 DIAGNOSIS — K582 Mixed irritable bowel syndrome: Secondary | ICD-10-CM | POA: Diagnosis not present

## 2021-04-22 DIAGNOSIS — Z7901 Long term (current) use of anticoagulants: Secondary | ICD-10-CM | POA: Diagnosis not present

## 2021-04-22 DIAGNOSIS — I10 Essential (primary) hypertension: Secondary | ICD-10-CM | POA: Diagnosis not present

## 2021-04-22 DIAGNOSIS — Z6834 Body mass index (BMI) 34.0-34.9, adult: Secondary | ICD-10-CM | POA: Diagnosis not present

## 2021-04-22 DIAGNOSIS — F32A Depression, unspecified: Secondary | ICD-10-CM | POA: Diagnosis not present

## 2021-04-22 DIAGNOSIS — E119 Type 2 diabetes mellitus without complications: Secondary | ICD-10-CM | POA: Diagnosis not present

## 2021-04-22 DIAGNOSIS — D120 Benign neoplasm of cecum: Secondary | ICD-10-CM | POA: Diagnosis not present

## 2021-04-22 DIAGNOSIS — R1310 Dysphagia, unspecified: Secondary | ICD-10-CM | POA: Diagnosis not present

## 2021-04-22 DIAGNOSIS — M8588 Other specified disorders of bone density and structure, other site: Secondary | ICD-10-CM | POA: Diagnosis not present

## 2021-04-22 DIAGNOSIS — E039 Hypothyroidism, unspecified: Secondary | ICD-10-CM | POA: Diagnosis not present

## 2021-04-22 DIAGNOSIS — R634 Abnormal weight loss: Secondary | ICD-10-CM | POA: Diagnosis not present

## 2021-04-22 DIAGNOSIS — E876 Hypokalemia: Secondary | ICD-10-CM | POA: Diagnosis not present

## 2021-04-22 DIAGNOSIS — U071 COVID-19: Secondary | ICD-10-CM | POA: Diagnosis not present

## 2021-04-22 DIAGNOSIS — E785 Hyperlipidemia, unspecified: Secondary | ICD-10-CM | POA: Diagnosis not present

## 2021-04-22 DIAGNOSIS — R931 Abnormal findings on diagnostic imaging of heart and coronary circulation: Secondary | ICD-10-CM | POA: Diagnosis not present

## 2021-04-22 DIAGNOSIS — R768 Other specified abnormal immunological findings in serum: Secondary | ICD-10-CM | POA: Diagnosis not present

## 2021-04-22 DIAGNOSIS — D122 Benign neoplasm of ascending colon: Secondary | ICD-10-CM | POA: Diagnosis not present

## 2021-04-22 DIAGNOSIS — F0284 Dementia in other diseases classified elsewhere, unspecified severity, with anxiety: Secondary | ICD-10-CM | POA: Diagnosis not present

## 2021-04-22 DIAGNOSIS — D124 Benign neoplasm of descending colon: Secondary | ICD-10-CM | POA: Diagnosis not present

## 2021-04-22 DIAGNOSIS — I4891 Unspecified atrial fibrillation: Secondary | ICD-10-CM | POA: Diagnosis not present

## 2021-04-22 DIAGNOSIS — F0283 Dementia in other diseases classified elsewhere, unspecified severity, with mood disturbance: Secondary | ICD-10-CM | POA: Diagnosis not present

## 2021-04-22 DIAGNOSIS — D123 Benign neoplasm of transverse colon: Secondary | ICD-10-CM | POA: Diagnosis not present

## 2021-04-23 DIAGNOSIS — Z7901 Long term (current) use of anticoagulants: Secondary | ICD-10-CM | POA: Diagnosis not present

## 2021-04-23 DIAGNOSIS — F32A Depression, unspecified: Secondary | ICD-10-CM | POA: Diagnosis not present

## 2021-04-23 DIAGNOSIS — F0283 Dementia in other diseases classified elsewhere, unspecified severity, with mood disturbance: Secondary | ICD-10-CM | POA: Diagnosis not present

## 2021-04-23 DIAGNOSIS — E785 Hyperlipidemia, unspecified: Secondary | ICD-10-CM | POA: Diagnosis not present

## 2021-04-23 DIAGNOSIS — F0284 Dementia in other diseases classified elsewhere, unspecified severity, with anxiety: Secondary | ICD-10-CM | POA: Diagnosis not present

## 2021-04-23 DIAGNOSIS — D122 Benign neoplasm of ascending colon: Secondary | ICD-10-CM | POA: Diagnosis not present

## 2021-04-23 DIAGNOSIS — M8588 Other specified disorders of bone density and structure, other site: Secondary | ICD-10-CM | POA: Diagnosis not present

## 2021-04-23 DIAGNOSIS — R1310 Dysphagia, unspecified: Secondary | ICD-10-CM | POA: Diagnosis not present

## 2021-04-23 DIAGNOSIS — D120 Benign neoplasm of cecum: Secondary | ICD-10-CM | POA: Diagnosis not present

## 2021-04-23 DIAGNOSIS — U071 COVID-19: Secondary | ICD-10-CM | POA: Diagnosis not present

## 2021-04-23 DIAGNOSIS — D123 Benign neoplasm of transverse colon: Secondary | ICD-10-CM | POA: Diagnosis not present

## 2021-04-23 DIAGNOSIS — I10 Essential (primary) hypertension: Secondary | ICD-10-CM | POA: Diagnosis not present

## 2021-04-23 DIAGNOSIS — Z6834 Body mass index (BMI) 34.0-34.9, adult: Secondary | ICD-10-CM | POA: Diagnosis not present

## 2021-04-23 DIAGNOSIS — K582 Mixed irritable bowel syndrome: Secondary | ICD-10-CM | POA: Diagnosis not present

## 2021-04-23 DIAGNOSIS — R931 Abnormal findings on diagnostic imaging of heart and coronary circulation: Secondary | ICD-10-CM | POA: Diagnosis not present

## 2021-04-23 DIAGNOSIS — D124 Benign neoplasm of descending colon: Secondary | ICD-10-CM | POA: Diagnosis not present

## 2021-04-23 DIAGNOSIS — R634 Abnormal weight loss: Secondary | ICD-10-CM | POA: Diagnosis not present

## 2021-04-23 DIAGNOSIS — I4891 Unspecified atrial fibrillation: Secondary | ICD-10-CM | POA: Diagnosis not present

## 2021-04-23 DIAGNOSIS — E876 Hypokalemia: Secondary | ICD-10-CM | POA: Diagnosis not present

## 2021-04-23 DIAGNOSIS — E039 Hypothyroidism, unspecified: Secondary | ICD-10-CM | POA: Diagnosis not present

## 2021-04-23 DIAGNOSIS — R768 Other specified abnormal immunological findings in serum: Secondary | ICD-10-CM | POA: Diagnosis not present

## 2021-04-23 DIAGNOSIS — E119 Type 2 diabetes mellitus without complications: Secondary | ICD-10-CM | POA: Diagnosis not present

## 2021-04-24 ENCOUNTER — Telehealth: Payer: Self-pay | Admitting: Nurse Practitioner

## 2021-04-24 DIAGNOSIS — M8588 Other specified disorders of bone density and structure, other site: Secondary | ICD-10-CM | POA: Diagnosis not present

## 2021-04-24 DIAGNOSIS — U071 COVID-19: Secondary | ICD-10-CM | POA: Diagnosis not present

## 2021-04-24 DIAGNOSIS — R931 Abnormal findings on diagnostic imaging of heart and coronary circulation: Secondary | ICD-10-CM | POA: Diagnosis not present

## 2021-04-24 DIAGNOSIS — D124 Benign neoplasm of descending colon: Secondary | ICD-10-CM | POA: Diagnosis not present

## 2021-04-24 DIAGNOSIS — Z7901 Long term (current) use of anticoagulants: Secondary | ICD-10-CM | POA: Diagnosis not present

## 2021-04-24 DIAGNOSIS — F0284 Dementia in other diseases classified elsewhere, unspecified severity, with anxiety: Secondary | ICD-10-CM | POA: Diagnosis not present

## 2021-04-24 DIAGNOSIS — E119 Type 2 diabetes mellitus without complications: Secondary | ICD-10-CM | POA: Diagnosis not present

## 2021-04-24 DIAGNOSIS — E876 Hypokalemia: Secondary | ICD-10-CM | POA: Diagnosis not present

## 2021-04-24 DIAGNOSIS — E039 Hypothyroidism, unspecified: Secondary | ICD-10-CM | POA: Diagnosis not present

## 2021-04-24 DIAGNOSIS — F0283 Dementia in other diseases classified elsewhere, unspecified severity, with mood disturbance: Secondary | ICD-10-CM | POA: Diagnosis not present

## 2021-04-24 DIAGNOSIS — I10 Essential (primary) hypertension: Secondary | ICD-10-CM | POA: Diagnosis not present

## 2021-04-24 DIAGNOSIS — R634 Abnormal weight loss: Secondary | ICD-10-CM | POA: Diagnosis not present

## 2021-04-24 DIAGNOSIS — E785 Hyperlipidemia, unspecified: Secondary | ICD-10-CM | POA: Diagnosis not present

## 2021-04-24 DIAGNOSIS — R1310 Dysphagia, unspecified: Secondary | ICD-10-CM | POA: Diagnosis not present

## 2021-04-24 DIAGNOSIS — D123 Benign neoplasm of transverse colon: Secondary | ICD-10-CM | POA: Diagnosis not present

## 2021-04-24 DIAGNOSIS — K582 Mixed irritable bowel syndrome: Secondary | ICD-10-CM | POA: Diagnosis not present

## 2021-04-24 DIAGNOSIS — D122 Benign neoplasm of ascending colon: Secondary | ICD-10-CM | POA: Diagnosis not present

## 2021-04-24 DIAGNOSIS — I4891 Unspecified atrial fibrillation: Secondary | ICD-10-CM | POA: Diagnosis not present

## 2021-04-24 DIAGNOSIS — D120 Benign neoplasm of cecum: Secondary | ICD-10-CM | POA: Diagnosis not present

## 2021-04-24 DIAGNOSIS — Z6834 Body mass index (BMI) 34.0-34.9, adult: Secondary | ICD-10-CM | POA: Diagnosis not present

## 2021-04-24 DIAGNOSIS — R768 Other specified abnormal immunological findings in serum: Secondary | ICD-10-CM | POA: Diagnosis not present

## 2021-04-24 DIAGNOSIS — F32A Depression, unspecified: Secondary | ICD-10-CM | POA: Diagnosis not present

## 2021-04-25 MED ORDER — NYSTATIN 100000 UNIT/GM EX POWD: 1.0000 "application " | Freq: Three times a day (TID) | CUTANEOUS | 1 refills | Status: DC

## 2021-04-25 MED ORDER — OMEPRAZOLE MAGNESIUM 20 MG PO TBEC: 20.0000 mg | DELAYED_RELEASE_TABLET | Freq: Every day | ORAL | 1 refills | Status: DC

## 2021-04-25 NOTE — Telephone Encounter (Signed)
Patient daughter aware. °

## 2021-04-25 NOTE — Telephone Encounter (Signed)
Patient is referring to the Nystatin powder ?

## 2021-04-25 NOTE — Telephone Encounter (Signed)
Omeprazole changed to 90 day supply and nystatin sent in for rash ?

## 2021-04-29 DIAGNOSIS — L570 Actinic keratosis: Secondary | ICD-10-CM | POA: Diagnosis not present

## 2021-04-30 ENCOUNTER — Telehealth: Payer: Self-pay | Admitting: Cardiology

## 2021-04-30 NOTE — Telephone Encounter (Signed)
Pt c/o Shortness Of Breath: STAT if SOB developed within the last 24 hours or pt is noticeably SOB on the phone ? ?1. Are you currently SOB (can you hear that pt is SOB on the phone)? yes ? ?2. How long have you been experiencing SOB? Since yesterday morning  ? ?3. Are you SOB when sitting or when up moving around? When moving around... but still slightly sob when sitting  ? ?4. Are you currently experiencing any other symptoms? no ? ?

## 2021-04-30 NOTE — Telephone Encounter (Signed)
Daughter stated patient has been sob since yesterday at rest and exertion. Pulse ranges from 104 -125 with O2 sat 94 - 97%. There is no weight gain. Patient is taking meds at ordered. There was no current blood pressure. Daughter is going to patient's house later this evening. She will check BP and have patient take 30 mg of diltiazem. Daughter stated that if patient is not better after the diltiazem, she will consider taking patient to the ED. ?

## 2021-05-01 ENCOUNTER — Telehealth: Payer: Self-pay | Admitting: Cardiology

## 2021-05-01 ENCOUNTER — Ambulatory Visit (HOSPITAL_COMMUNITY)
Admission: RE | Admit: 2021-05-01 | Discharge: 2021-05-01 | Disposition: A | Discharge status: Home / Self Care | Payer: Medicare HMO | Source: Ambulatory Visit | Attending: Physician Assistant | Admitting: Physician Assistant

## 2021-05-01 ENCOUNTER — Other Ambulatory Visit: Payer: Self-pay

## 2021-05-01 ENCOUNTER — Telehealth: Payer: Self-pay | Admitting: Pharmacist

## 2021-05-01 ENCOUNTER — Encounter (HOSPITAL_COMMUNITY): Payer: Self-pay | Admitting: Physician Assistant

## 2021-05-01 VITALS — BP 142/84 | HR 85 | Ht 63.0 in | Wt 172.6 lb

## 2021-05-01 DIAGNOSIS — Z8673 Personal history of transient ischemic attack (TIA), and cerebral infarction without residual deficits: Secondary | ICD-10-CM | POA: Insufficient documentation

## 2021-05-01 DIAGNOSIS — E039 Hypothyroidism, unspecified: Secondary | ICD-10-CM | POA: Diagnosis not present

## 2021-05-01 DIAGNOSIS — Z79899 Other long term (current) drug therapy: Secondary | ICD-10-CM | POA: Insufficient documentation

## 2021-05-01 DIAGNOSIS — R9431 Abnormal electrocardiogram [ECG] [EKG]: Secondary | ICD-10-CM | POA: Diagnosis not present

## 2021-05-01 DIAGNOSIS — Z7901 Long term (current) use of anticoagulants: Secondary | ICD-10-CM | POA: Diagnosis not present

## 2021-05-01 DIAGNOSIS — I2510 Atherosclerotic heart disease of native coronary artery without angina pectoris: Secondary | ICD-10-CM | POA: Insufficient documentation

## 2021-05-01 DIAGNOSIS — F0283 Dementia in other diseases classified elsewhere, unspecified severity, with mood disturbance: Secondary | ICD-10-CM | POA: Diagnosis not present

## 2021-05-01 DIAGNOSIS — E785 Hyperlipidemia, unspecified: Secondary | ICD-10-CM | POA: Diagnosis not present

## 2021-05-01 DIAGNOSIS — D6869 Other thrombophilia: Secondary | ICD-10-CM | POA: Insufficient documentation

## 2021-05-01 DIAGNOSIS — R0602 Shortness of breath: Secondary | ICD-10-CM | POA: Insufficient documentation

## 2021-05-01 DIAGNOSIS — Z6834 Body mass index (BMI) 34.0-34.9, adult: Secondary | ICD-10-CM | POA: Diagnosis not present

## 2021-05-01 DIAGNOSIS — I4821 Permanent atrial fibrillation: Secondary | ICD-10-CM

## 2021-05-01 DIAGNOSIS — M8588 Other specified disorders of bone density and structure, other site: Secondary | ICD-10-CM | POA: Diagnosis not present

## 2021-05-01 DIAGNOSIS — R768 Other specified abnormal immunological findings in serum: Secondary | ICD-10-CM | POA: Diagnosis not present

## 2021-05-01 DIAGNOSIS — E669 Obesity, unspecified: Secondary | ICD-10-CM | POA: Insufficient documentation

## 2021-05-01 DIAGNOSIS — Z7182 Exercise counseling: Secondary | ICD-10-CM | POA: Diagnosis not present

## 2021-05-01 DIAGNOSIS — E119 Type 2 diabetes mellitus without complications: Secondary | ICD-10-CM | POA: Insufficient documentation

## 2021-05-01 DIAGNOSIS — D122 Benign neoplasm of ascending colon: Secondary | ICD-10-CM | POA: Diagnosis not present

## 2021-05-01 DIAGNOSIS — I10 Essential (primary) hypertension: Secondary | ICD-10-CM | POA: Insufficient documentation

## 2021-05-01 DIAGNOSIS — R1310 Dysphagia, unspecified: Secondary | ICD-10-CM | POA: Diagnosis not present

## 2021-05-01 DIAGNOSIS — I4891 Unspecified atrial fibrillation: Secondary | ICD-10-CM | POA: Diagnosis not present

## 2021-05-01 DIAGNOSIS — I4510 Unspecified right bundle-branch block: Secondary | ICD-10-CM | POA: Diagnosis not present

## 2021-05-01 DIAGNOSIS — R931 Abnormal findings on diagnostic imaging of heart and coronary circulation: Secondary | ICD-10-CM | POA: Diagnosis not present

## 2021-05-01 DIAGNOSIS — I4811 Longstanding persistent atrial fibrillation: Secondary | ICD-10-CM | POA: Diagnosis not present

## 2021-05-01 DIAGNOSIS — F32A Depression, unspecified: Secondary | ICD-10-CM | POA: Diagnosis not present

## 2021-05-01 DIAGNOSIS — R634 Abnormal weight loss: Secondary | ICD-10-CM | POA: Diagnosis not present

## 2021-05-01 DIAGNOSIS — U071 COVID-19: Secondary | ICD-10-CM | POA: Diagnosis not present

## 2021-05-01 DIAGNOSIS — Z6830 Body mass index (BMI) 30.0-30.9, adult: Secondary | ICD-10-CM | POA: Insufficient documentation

## 2021-05-01 DIAGNOSIS — E876 Hypokalemia: Secondary | ICD-10-CM | POA: Diagnosis not present

## 2021-05-01 DIAGNOSIS — K582 Mixed irritable bowel syndrome: Secondary | ICD-10-CM | POA: Diagnosis not present

## 2021-05-01 DIAGNOSIS — F0284 Dementia in other diseases classified elsewhere, unspecified severity, with anxiety: Secondary | ICD-10-CM | POA: Diagnosis not present

## 2021-05-01 DIAGNOSIS — D123 Benign neoplasm of transverse colon: Secondary | ICD-10-CM | POA: Diagnosis not present

## 2021-05-01 DIAGNOSIS — D124 Benign neoplasm of descending colon: Secondary | ICD-10-CM | POA: Diagnosis not present

## 2021-05-01 DIAGNOSIS — D120 Benign neoplasm of cecum: Secondary | ICD-10-CM | POA: Diagnosis not present

## 2021-05-01 LAB — BASIC METABOLIC PANEL
Anion gap: 10 (ref 5–15)
BUN: 12 mg/dL (ref 8–23)
CO2: 30 mmol/L (ref 22–32)
Calcium: 8.6 mg/dL — ABNORMAL LOW (ref 8.9–10.3)
Chloride: 99 mmol/L (ref 98–111)
Creatinine, Ser: 1.05 mg/dL — ABNORMAL HIGH (ref 0.44–1.00)
GFR, Estimated: 53 mL/min — ABNORMAL LOW (ref >60)
Glucose, Bld: 108 mg/dL — ABNORMAL HIGH (ref 70–99)
Potassium: 3.8 mmol/L (ref 3.5–5.1)
Sodium: 139 mmol/L (ref 135–145)

## 2021-05-01 LAB — CBC
HCT: 40.8 % (ref 36.0–46.0)
Hemoglobin: 12.7 g/dL (ref 12.0–15.0)
MCH: 28.9 pg (ref 26.0–34.0)
MCHC: 31.1 g/dL (ref 30.0–36.0)
MCV: 92.9 fL (ref 80.0–100.0)
Platelets: 270 10*3/uL (ref 150–400)
RBC: 4.39 MIL/uL (ref 3.87–5.11)
RDW: 14 % (ref 11.5–15.5)
WBC: 9 10*3/uL (ref 4.0–10.5)
nRBC: 0 % (ref 0.0–0.2)

## 2021-05-01 LAB — TSH: TSH: 5.756 u[IU]/mL — ABNORMAL HIGH (ref 0.350–4.500)

## 2021-05-01 LAB — BRAIN NATRIURETIC PEPTIDE: B Natriuretic Peptide: 304.1 pg/mL — ABNORMAL HIGH (ref 0.0–100.0)

## 2021-05-01 MED ORDER — DILTIAZEM HCL 90 MG PO TABS: 90.0000 mg | ORAL_TABLET | Freq: Three times a day (TID) | ORAL | 2 refills | Status: DC

## 2021-05-01 NOTE — Telephone Encounter (Signed)
Called and spoke with daughter and notified her about the patient assistance from [place]. Daughter also wanted me to let MMM know that she contacted our office on 3/14 with complaints of SOB. She spoke with triage and they advised that she contact her cardiologist which they did. They wanted her to come to [facility] for an EKG and some blood work. EKG showed Afib which it usually does and they are awaiting blood work results. They just wanted you to know what was going on .  ?

## 2021-05-01 NOTE — Progress Notes (Signed)
? ? ?Primary Care Physician: Chevis Pretty, FNP ?Primary Cardiologist: Dr Percival Spanish  ?Primary Electrophysiologist: none ?Referring Physician: Dr Percival Spanish  ? ? ?Veronica Paul is a 86 y.o. female with a history of CAD, HTN, DM, atrial fibrillation who presents for consultation in the [facility].  Patient is on Eliquis for a CHADS2VASC score of 6. Patient reports that she felt more SOB on 04/29/21. Not related to activity or position. She was working with PT today and her heart rates were 90s-140. She denies any fever, chills, cough, increased weight, or edema. No bleeding issues on anticoagulation. She is rate controlled in office today in no acute distress.  ? ?Today, she denies symptoms of palpitations, chest pain, orthopnea, PND, lower extremity edema, dizziness, presyncope, syncope, snoring, daytime somnolence, bleeding, or neurologic sequela. The patient is tolerating medications without difficulties and is otherwise without complaint today.  ? ? ?Atrial Fibrillation Risk Factors: ? ?she does not have symptoms or diagnosis of sleep apnea. ?she does not have a history of rheumatic fever. ? ? ?she has a BMI of Body mass index is 30.57 kg/m?Marland KitchenMarland Kitchen ?Filed Weights  ? 05/01/21 1319  ?Weight: 78.3 kg  ? ? ?Family History  ?Problem Relation Age of Onset  ? Coronary artery disease Brother 59  ? Hypertension Brother   ? Heart disease Brother   ? Hypertension Mother   ? Hypertension Father   ? Heart disease Father   ? Hypertension Sister   ? Heart disease Sister   ? Cancer Sister   ? Arthritis Daughter   ? COPD Daughter   ? Fibromyalgia Daughter   ? Ulcerative colitis Son   ? Stroke Maternal Grandmother   ? Cancer Brother   ? Lung cancer Brother   ? Hypertension Sister   ? Cancer Sister   ? Colon cancer Neg Hx   ? Food intolerance Neg Hx   ? Esophageal cancer Neg Hx   ? Rectal cancer Neg Hx   ? Stomach cancer Neg Hx   ? Colon polyps Neg Hx   ? ? ? ?Atrial Fibrillation Management  history: ? ?Previous antiarrhythmic drugs: none ?Previous cardioversions: none ?Previous ablations: none ?CHADS2VASC score: 6 ?Anticoagulation history: Eliquis ? ? ?Past Medical History:  ?Diagnosis Date  ? Allergy   ? "my nose runs alot"  ? Anxiety   ? Arthritis   ? Atrial fibrillation ([place])   ? Blood transfusion without reported diagnosis   ? "when I had a miscarrage in 1957"  ? Cataract   ? bilateral removed  ? Cholelithiasis   ? Constipation   ? x ray showed increased stool in colon- uses Miralax daily and Dulcolax twice a week   ? Depression   ? Diabetes mellitus   ? Dyslipidemia   ? HTN (hypertension)   ? Hx of long-term (current) use of anticoagulants   ? Hyperlipidemia   ? Hypertension   ? Hypothyroidism   ? Lung cancer ([place])   ? Obesity   ? Osteopenia   ? Vitamin D deficiency   ? ?Past Surgical History:  ?Procedure Laterality Date  ? CATARACT EXTRACTION Bilateral   ? COLONOSCOPY    ? COLONOSCOPY WITH PROPOFOL N/A 05/06/2019  ? Procedure: COLONOSCOPY WITH PROPOFOL;  Surgeon: Jerene Bears, MD;  Location: [facility];  Service: Gastroenterology;  Laterality: N/A;  ? LUNG REMOVAL, PARTIAL  1998  ? Right  ? POLYPECTOMY  05/06/2019  ? Procedure: POLYPECTOMY;  Surgeon: Jerene Bears, MD;  Location: [facility];  Service: Gastroenterology;;  ? THYROIDECTOMY    ? TUBAL LIGATION    ? UPPER GASTROINTESTINAL ENDOSCOPY    ? ? ?Current Outpatient Medications  ?Medication Sig Dispense Refill  ? acetaminophen (TYLENOL) 500 MG tablet Take 500 mg by mouth every 6 (six) hours as needed for moderate pain, headache or fever.    ? apixaban (ELIQUIS) 5 MG TABS tablet Take 1 tablet (5 mg total) by mouth 2 (two) times daily. 180 tablet 1  ? bisacodyl (DULCOLAX) 5 MG EC tablet Take 5 mg by mouth daily as needed for moderate constipation (usually twice per week).    ? Blood Glucose Monitoring Suppl (ONETOUCH VERIO) w/Device KIT Check blood sugars daily Dx E11.9 1 kit 0  ? diltiazem (CARDIZEM) 30 MG tablet Take 1 tablet (30 mg total)  by mouth daily as needed. 30 tablet 6  ? feeding supplement (ENSURE ENLIVE / ENSURE PLUS) LIQD Take 237 mLs by mouth 3 (three) times daily between meals. 237 mL 12  ? furosemide (LASIX) 20 MG tablet Take 1 tablet (20 mg total) by mouth daily. 90 tablet 1  ? Lancet Devices (ONETOUCH DELICA PLUS LANCING) MISC 1 Device by Does not apply route daily. 1 each 0  ? Lancets (ONETOUCH DELICA PLUS [id number]) MISC USE TO CHECK BLOOD SUGARS DAILY DX E11.9 100 each 3  ? levothyroxine (SYNTHROID) 75 MCG tablet Take 1 tablet (75 mcg total) by mouth daily. 90 tablet 1  ? metoprolol tartrate (LOPRESSOR) 100 MG tablet Take 1.5 tablets (150 mg total) by mouth 2 (two) times daily. 180 tablet 1  ? nitroGLYCERIN (NITROSTAT) 0.4 MG SL tablet Place 1 tablet (0.4 mg total) under the tongue every 5 (five) minutes as needed for chest pain. 25 tablet 1  ? nystatin (MYCOSTATIN/NYSTOP) powder Apply 1 application. topically 3 (three) times daily. 15 g 1  ? omeprazole (PRILOSEC) 20 MG capsule Take 20 mg by mouth daily.    ? ONETOUCH VERIO test strip CHECK BLOOD SUGARS DAILY DX E11.9 100 strip 3  ? polyethylene glycol (MIRALAX / GLYCOLAX) 17 g packet Take 17 g by mouth daily as needed for mild constipation.    ? sertraline (ZOLOFT) 50 MG tablet TAKE 1 TABLET BY MOUTH EVERY DAY 90 tablet 1  ? triamcinolone cream (KENALOG) 0.1 % Apply topically.    ? diltiazem (CARDIZEM) 90 MG tablet Take 1 tablet (90 mg total) by mouth 3 (three) times daily. 90 tablet 2  ? ?No current facility-administered medications for this encounter.  ? ? ?Allergies  ?Allergen Reactions  ? Ace Inhibitors Cough  ? Feldene [Piroxicam]   ?  REACTION: RASH TO HANDS  ? Lexapro [Escitalopram]   ? Meloxicam Nausea And Vomiting  ? Prevnar [Pneumococcal 13-Val Conj Vacc]   ? Statins Other (See Comments)  ?  myalgia  ? Sulfa Antibiotics   ?  Rash ?  ? Zithromax [Azithromycin Dihydrate] Itching  ? ? ?Social History  ? ?Socioeconomic History  ? Marital status: Widowed  ?  Spouse name: Not  on file  ? Number of children: 4  ? Years of education: 82  ? Highest education level: 12th grade  ?Occupational History  ? 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 Smoking status: Never  ? Smokeless tobacco: Never  ?Vaping Use  ? Vaping Use: Never used  ?Substance and Sexual Activity  ? Alcohol use: No  ? Drug use: No  ? Sexual activity: Not Currently  ?Other Topics Concern  ?  Not on file  ?Social History Narrative  ? Lives alone - one level  ? ?Social Determinants of Health  ? ?Financial Resource Strain: Low Risk   ? Difficulty of Paying Living Expenses: Not very hard  ?Food Insecurity: No Food Insecurity  ? Worried About Charity fundraiser in the Last Year: Never true  ? Ran Out of Food in the Last Year: Never true  ?Transportation Needs: No Transportation Needs  ? Lack of Transportation (Medical): No  ? Lack of Transportation (Non-Medical): No  ?Physical Activity: Insufficiently Active  ? Days of Exercise per Week: 7 days  ? Minutes of Exercise per Session: 20 min  ?Stress: Stress Concern Present  ? Feeling of Stress : To some extent  ?Social Connections: Moderately Isolated  ? Frequency of Communication with Friends and Family: More than three times a week  ? Frequency of Social Gatherings with Friends and Family: More than three times a week  ? Attends Religious Services: 1 to 4 times per year  ? Active Member of Clubs or Organizations: No  ? Attends Archivist Meetings: Never  ? Marital Status: Widowed  ?Intimate Partner Violence: Not At Risk  ? Fear of Current or Ex-Partner: No  ? Emotionally Abused: No  ? Physically Abused: No  ? Sexually Abused: No  ? ? ? ?ROS- All systems are reviewed and negative except as per the HPI above. ? ?Physical Exam: ?Vitals:  ? 05/01/21 1319  ?BP: (!) 142/84  ?Pulse: 85  ?Weight: 78.3 kg  ?Height: '5\' 3"'  (1.6 m)  ? ? ?GEN- The patient is a well appearing elderly female, alert and oriented x 3 today.   ?Head- normocephalic, atraumatic ?Eyes-   Sclera clear, conjunctiva pink ?Ears- hearing intact ?Oropharynx- clear ?Neck- supple  ?Lungs- Clear to ausculation bilaterally, normal work of breathing ?Heart- irregular rate and rhythm, no murmurs, rubs or gallo

## 2021-05-01 NOTE — Patient Instructions (Signed)
Increase diltiazem to 90mg  three times a day ? ? ?

## 2021-05-01 NOTE — Telephone Encounter (Signed)
Received stat call into triage from patients home health nurse. Per nurse patient has been experiencing increased HR for the last couple of days. Per patient's home health nurse and daughter patients HR has been ranging 95-120 BPM with rest, reports that patients HR does increase with exertion and highest it has been with exertion is 140 BPM. Patient does report a slight increase in shortness of breath with exertion since having increased HR. Patient denies any chest pain, lightheadedness or increase in swelling. Patient does take her Eliquis 5mg  BID, along with Dilt 60 TID and Dilt 30 as needed. Patient did take PRN dose last night with slight relief. Per patients home health nurse patients BP this morning is 118/80. Scheduled patient with [facility] today at 1:30pm- did advise that if patient became more symptomatic or developed other symptoms she should report directly to the ER to be evaluated. Patient and patients daughter verbalized understanding of all instructions.  ? ?Spoke with DOD who recommends patient be seen in the ER if becomes more symptomatic/develop any symptoms. Agrees with Afib clinic appointment today.  ? ?Patients home health nurse, patient, and patients daughter all aware of recommendations and verbalized understanding of all instructions.  ? ? ? ? ?

## 2021-05-01 NOTE — Telephone Encounter (Signed)
Forms were placed in my box ?I completed & faxed patient's application for eliquis patient assistance ?I did not see patient in clinic so I will not follow application  ?Patient can call [facility] patient assistance at [phone] to check on the status ? ?Please let patient know ?

## 2021-05-01 NOTE — Telephone Encounter (Signed)
Pt c/o Shortness Of Breath: STAT if SOB developed within the last 24 hours or pt is noticeably SOB on the phone ? ?1. Are you currently SOB (can you hear that pt is SOB on the phone)? No  ? ?2. How long have you been experiencing SOB? Past 2 days ? ?3. Are you SOB when sitting or when up moving around? Moving around ? ?4. Are you currently experiencing any other symptoms? No  ? ? ?STAT if HR is under 50 or over 120 ?(normal HR is 60-100 beats per minute) ? ?What is your heart rate? 121 ? ?Do you have a log of your heart rate readings (document readings)? Yes  ? ?Do you have any other symptoms? No    ?

## 2021-05-03 ENCOUNTER — Telehealth: Payer: Self-pay | Admitting: Nurse Practitioner

## 2021-05-03 NOTE — Telephone Encounter (Signed)
Patient aware and verbalized understanding. Patient states stomach pain is started again just wanted it note in chart  ?

## 2021-05-03 NOTE — Telephone Encounter (Signed)
PT HAD labs drawn 05/01/2021 and the results were forwarded to MMM to review. Daughter is calling for MMM advice on this. I did scheduled an apt for Tuesday with MMM. Pt was not admitted. Please call back to review results. ?

## 2021-05-03 NOTE — Telephone Encounter (Signed)
We just need to adjust her thyroid meds- noting emergent- we can do at appointment next week. ?

## 2021-05-07 ENCOUNTER — Ambulatory Visit (INDEPENDENT_AMBULATORY_CARE_PROVIDER_SITE_OTHER): Payer: Medicare HMO | Admitting: Nurse Practitioner

## 2021-05-07 ENCOUNTER — Encounter: Payer: Self-pay | Admitting: Nurse Practitioner

## 2021-05-07 VITALS — BP 135/66 | HR 68 | Temp 97.0°F | Resp 20 | Ht 63.0 in | Wt 171.0 lb

## 2021-05-07 DIAGNOSIS — I4891 Unspecified atrial fibrillation: Secondary | ICD-10-CM

## 2021-05-07 DIAGNOSIS — E039 Hypothyroidism, unspecified: Secondary | ICD-10-CM | POA: Diagnosis not present

## 2021-05-07 MED ORDER — LEVOTHYROXINE SODIUM 88 MCG PO TABS: 88.0000 ug | ORAL_TABLET | Freq: Every day | ORAL | 3 refills | Status: DC

## 2021-05-07 NOTE — Progress Notes (Signed)
? ?Subjective:  ? ? Patient ID: Veronica Paul, female    DOB: [birth year], 86 y.o.   MRN: [id number] ? ? ?Chief Complaint: Hospitalization Follow-up (Told to come in today to adjust thyroid med) ? ? ?HPI ?Patient was taken to the Dr. Warren Lacy last week morning with SOB. She was in atrial fib and she was seen in office on 05/11/21 by Clint. [place], [place]. She is on eliquis and her heart rate was fluctuating from 90-140's. [place] score of 6. They increased her diltiazem to 90mg  TID. Denies heart rate going up since increase in meds. When they did blood work her [place] was 5.756.  ? ?Patient is c/o abdomen hurting her again, but this has been going on for several years. Multiple tests have been run and cannot find anything wrong. She is on omeprazole daily which helps. ?Patient stays by herself in the evenings and night. She does not have an emergency call button. ? ?Review of Systems  ?Constitutional:  Negative for diaphoresis.  ?Eyes:  Negative for pain.  ?Respiratory:  Negative for shortness of breath and wheezing.   ?Cardiovascular:  Negative for chest pain, palpitations and leg swelling.  ?Gastrointestinal:  Negative for abdominal pain.  ?Endocrine: Negative for polydipsia.  ?Skin:  Negative for rash.  ?Neurological:  Negative for dizziness, weakness and headaches.  ?Hematological:  Does not bruise/bleed easily.  ?All other systems reviewed and are negative. ? ?   ?Objective:  ? Physical Exam ?Vitals and nursing note reviewed.  ?Constitutional:   ?   General: She is not in acute distress. ?   Appearance: Normal appearance. She is well-developed.  ?Neck:  ?   Vascular: No carotid bruit or JVD.  ?Cardiovascular:  ?   Rate and Rhythm: Normal rate. Rhythm irregular.  ?   Heart sounds: Normal heart sounds.  ?Pulmonary:  ?   Effort: Pulmonary effort is normal. No respiratory distress.  ?   Breath sounds: Normal breath sounds. No wheezing or rales.  ?Chest:  ?   Chest wall: No tenderness.  ?Abdominal:  ?   General: Bowel sounds  are normal. There is no distension or abdominal bruit.  ?   Palpations: Abdomen is soft. There is no hepatomegaly, splenomegaly, mass or pulsatile mass.  ?   Tenderness: There is no abdominal tenderness.  ?Musculoskeletal:     ?   General: Normal range of motion.  ?   Cervical back: Normal range of motion and neck supple.  ?Lymphadenopathy:  ?   Cervical: No cervical adenopathy.  ?Skin: ?   General: Skin is warm and dry.  ?Neurological:  ?   Mental Status: She is alert and oriented to person, place, and time.  ?   Deep Tendon Reflexes: Reflexes are normal and symmetric.  ?Psychiatric:     ?   Behavior: Behavior normal.     ?   Thought Content: Thought content normal.     ?   Judgment: Judgment normal.  ? ? ?BP 135/66   Pulse 68   Temp (!) 97 ?F (36.1 ?C) (Temporal)   Resp 20   Ht 5\' 3"  (1.6 m)   Wt 171 lb (77.6 kg)   LMP 05/11/1992   SpO2 99%   BMI 30.29 kg/m?  ? ? ? ?   ?Assessment & Plan:  ?REBACCA VOTAW in today with chief complaint of Hospitalization Follow-up (Told to come in today to adjust thyroid med) ? ? ?1. Atrial fibrillation with RVR ([place]) ?Cardiology office records reviewed ?  Continue meds as discussed- increase in diltiazem ?Keep check of heart rate ? ?2. Acquired hypothyroidism ?Increase levothyroxin to 78mcg today ?Will repeat labs in 8 weeks ?Meds ordered this encounter  ?Medications  ? levothyroxine (SYNTHROID) 88 MCG tablet  ?  Sig: Take 1 tablet (88 mcg total) by mouth daily.  ?  Dispense:  90 tablet  ?  Refill:  3  ?  Order Specific Question:   Supervising Provider  ?  Answer:   Caryl Pina A [[id number]]  ? ? ? ? ? ?The above assessment and management plan was discussed with the patient. The patient verbalized understanding of and has agreed to the management plan. Patient is aware to call the clinic if symptoms persist or worsen. Patient is aware when to return to the clinic for a follow-up visit. Patient educated on when it is appropriate to go to the emergency department.   ? ?[name], FNP ? ? ?

## 2021-05-07 NOTE — Patient Instructions (Signed)

## 2021-05-14 ENCOUNTER — Ambulatory Visit (HOSPITAL_COMMUNITY): Payer: Medicare HMO | Admitting: Physician Assistant

## 2021-05-14 NOTE — Progress Notes (Signed)
? ? ?Primary Care Physician: Chevis Pretty, FNP ?Primary Cardiologist: Dr Percival Spanish  ?Primary Electrophysiologist: none ?Referring Physician: Dr Percival Spanish  ? ? ?Veronica Paul is a 86 y.o. female with a history of CAD, HTN, DM, atrial fibrillation who presents for follow up in the [facility].  Patient is on Eliquis for a CHADS2VASC score of 6. She was working with PT and her heart rates were 90s-140. Seen 05/01/21 and her diltiazem was increased.  ? ?On follow up today, patient reports she has done well since her last appointment. Her heart rates have been well controlled on the higher dose of diltiazem. Her PCP also increased her dose of levothyroxine and she does have more energy.  ? ?Today, she denies symptoms of palpitations, chest pain, orthopnea, PND, dizziness, presyncope, syncope, snoring, daytime somnolence, bleeding, or neurologic sequela. The patient is tolerating medications without difficulties and is otherwise without complaint today.  ? ? ?Atrial Fibrillation Risk Factors: ? ?she does not have symptoms or diagnosis of sleep apnea. ?she does not have a history of rheumatic fever. ? ? ?she has a BMI of Body mass index is 30.5 kg/m?Marland KitchenMarland Kitchen ?Filed Weights  ? 05/15/21 0905  ?Weight: 78.1 kg  ? ? ? ?Family History  ?Problem Relation Age of Onset  ? Coronary artery disease Brother 51  ? Hypertension Brother   ? Heart disease Brother   ? Hypertension Mother   ? Hypertension Father   ? Heart disease Father   ? Hypertension Sister   ? Heart disease Sister   ? Cancer Sister   ? Arthritis Daughter   ? COPD Daughter   ? Fibromyalgia Daughter   ? Ulcerative colitis Son   ? Stroke Maternal Grandmother   ? Cancer Brother   ? Lung cancer Brother   ? Hypertension Sister   ? Cancer Sister   ? Colon cancer Neg Hx   ? Food intolerance Neg Hx   ? Esophageal cancer Neg Hx   ? Rectal cancer Neg Hx   ? Stomach cancer Neg Hx   ? Colon polyps Neg Hx   ? ? ? ?Atrial Fibrillation Management  history: ? ?Previous antiarrhythmic drugs: none ?Previous cardioversions: none ?Previous ablations: none ?CHADS2VASC score: 6 ?Anticoagulation history: Eliquis ? ? ?Past Medical History:  ?Diagnosis Date  ? Allergy   ? "my nose runs alot"  ? Anxiety   ? Arthritis   ? Atrial fibrillation ([place])   ? Blood transfusion without reported diagnosis   ? "when I had a miscarrage in 1957"  ? Cataract   ? bilateral removed  ? Cholelithiasis   ? Constipation   ? x ray showed increased stool in colon- uses Miralax daily and Dulcolax twice a week   ? Depression   ? Diabetes mellitus   ? Dyslipidemia   ? HTN (hypertension)   ? Hx of long-term (current) use of anticoagulants   ? Hyperlipidemia   ? Hypertension   ? Hypothyroidism   ? Lung cancer ([place])   ? Obesity   ? Osteopenia   ? Vitamin D deficiency   ? ?Past Surgical History:  ?Procedure Laterality Date  ? CATARACT EXTRACTION Bilateral   ? COLONOSCOPY    ? COLONOSCOPY WITH PROPOFOL N/A 05/06/2019  ? Procedure: COLONOSCOPY WITH PROPOFOL;  Surgeon: Jerene Bears, MD;  Location: [facility];  Service: Gastroenterology;  Laterality: N/A;  ? LUNG REMOVAL, PARTIAL  1998  ? Right  ? POLYPECTOMY  05/06/2019  ? Procedure: POLYPECTOMY;  Surgeon:  Pyrtle, Lajuan Lines, MD;  Location: [facility];  Service: Gastroenterology;;  ? THYROIDECTOMY    ? TUBAL LIGATION    ? UPPER GASTROINTESTINAL ENDOSCOPY    ? ? ?Current Outpatient Medications  ?Medication Sig Dispense Refill  ? acetaminophen (TYLENOL) 500 MG tablet Take 500 mg by mouth every 6 (six) hours as needed for moderate pain, headache or fever.    ? apixaban (ELIQUIS) 5 MG TABS tablet Take 1 tablet (5 mg total) by mouth 2 (two) times daily. 180 tablet 1  ? bisacodyl (DULCOLAX) 5 MG EC tablet Take 5 mg by mouth daily as needed for moderate constipation (usually twice per week).    ? Blood Glucose Monitoring Suppl (ONETOUCH VERIO) w/Device KIT Check blood sugars daily Dx E11.9 1 kit 0  ? diltiazem (CARDIZEM) 90 MG tablet Take 1 tablet (90 mg total)  by mouth 3 (three) times daily. 90 tablet 2  ? feeding supplement (ENSURE ENLIVE / ENSURE PLUS) LIQD Take 237 mLs by mouth 3 (three) times daily between meals. 237 mL 12  ? furosemide (LASIX) 20 MG tablet Take 1 tablet (20 mg total) by mouth daily. 90 tablet 1  ? Lancet Devices (ONETOUCH DELICA PLUS LANCING) MISC 1 Device by Does not apply route daily. 1 each 0  ? Lancets (ONETOUCH DELICA PLUS [id number]) MISC USE TO CHECK BLOOD SUGARS DAILY DX E11.9 100 each 3  ? levothyroxine (SYNTHROID) 88 MCG tablet Take 1 tablet (88 mcg total) by mouth daily. 90 tablet 3  ? metoprolol tartrate (LOPRESSOR) 100 MG tablet Take 1.5 tablets (150 mg total) by mouth 2 (two) times daily. 180 tablet 1  ? nitroGLYCERIN (NITROSTAT) 0.4 MG SL tablet Place 1 tablet (0.4 mg total) under the tongue every 5 (five) minutes as needed for chest pain. 25 tablet 1  ? nystatin (MYCOSTATIN/NYSTOP) powder Apply 1 application. topically 3 (three) times daily. 15 g 1  ? omeprazole (PRILOSEC) 20 MG capsule Take 20 mg by mouth daily.    ? ONETOUCH VERIO test strip CHECK BLOOD SUGARS DAILY DX E11.9 100 strip 3  ? polyethylene glycol (MIRALAX / GLYCOLAX) 17 g packet Take 17 g by mouth daily as needed for mild constipation.    ? sertraline (ZOLOFT) 50 MG tablet TAKE 1 TABLET BY MOUTH EVERY DAY 90 tablet 1  ? triamcinolone cream (KENALOG) 0.1 % Apply topically.    ? ?No current facility-administered medications for this encounter.  ? ? ?Allergies  ?Allergen Reactions  ? Ace Inhibitors Cough  ? Feldene [Piroxicam]   ?  REACTION: RASH TO HANDS  ? Lexapro [Escitalopram]   ? Meloxicam Nausea And Vomiting  ? Prevnar [Pneumococcal 13-Val Conj Vacc]   ? Statins Other (See Comments)  ?  myalgia  ? Sulfa Antibiotics   ?  Rash ?  ? Zithromax [Azithromycin Dihydrate] Itching  ? ? ?Social History  ? ?Socioeconomic History  ? Marital status: Widowed  ?  Spouse name: Not on file  ? Number of children: 4  ? Years of education: 33  ? Highest education level: 12th grade   ?Occupational History  ? 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 Smoking status: Never  ? Smokeless tobacco: Never  ? Tobacco comments:  ?  Never smoke 05/15/21  ?Vaping Use  ? Vaping Use: Never used  ?Substance and Sexual Activity  ? Alcohol use: No  ? Drug use: No  ? Sexual activity: Not Currently  ?Other Topics Concern  ? Not on  file  ?Social History Narrative  ? Lives alone - one level  ? ?Social Determinants of Health  ? ?Financial Resource Strain: Low Risk   ? Difficulty of Paying Living Expenses: Not very hard  ?Food Insecurity: No Food Insecurity  ? Worried About Charity fundraiser in the Last Year: Never true  ? Ran Out of Food in the Last Year: Never true  ?Transportation Needs: No Transportation Needs  ? Lack of Transportation (Medical): No  ? Lack of Transportation (Non-Medical): No  ?Physical Activity: Insufficiently Active  ? Days of Exercise per Week: 7 days  ? Minutes of Exercise per Session: 20 min  ?Stress: Stress Concern Present  ? Feeling of Stress : To some extent  ?Social Connections: Moderately Isolated  ? Frequency of Communication with Friends and Family: More than three times a week  ? Frequency of Social Gatherings with Friends and Family: More than three times a week  ? Attends Religious Services: 1 to 4 times per year  ? Active Member of Clubs or Organizations: No  ? Attends Archivist Meetings: Never  ? Marital Status: Widowed  ?Intimate Partner Violence: Not At Risk  ? Fear of Current or Ex-Partner: No  ? Emotionally Abused: No  ? Physically Abused: No  ? Sexually Abused: No  ? ? ? ?ROS- All systems are reviewed and negative except as per the HPI above. ? ?Physical Exam: ?Vitals:  ? 05/15/21 0905  ?BP: 118/74  ?Pulse: 71  ?Weight: 78.1 kg  ?Height: _0  (1.6 m)  ? ? ?GEN- The patient is a well appearing elderly female, alert and oriented x 3 today.   ?HEENT-head normocephalic, atraumatic, sclera clear, conjunctiva pink, hearing intact, trachea  midline. ?Lungs- Clear to ausculation bilaterally, normal work of breathing ?Heart- irregular rate and rhythm, no murmurs, rubs or gallops  ?GI- soft, NT, ND, + BS ?Extremities- no clubbing, cyanosis, nonpitting edema.

## 2021-05-15 ENCOUNTER — Telehealth: Payer: Self-pay | Admitting: Nurse Practitioner

## 2021-05-15 ENCOUNTER — Ambulatory Visit (HOSPITAL_COMMUNITY)
Admission: RE | Admit: 2021-05-15 | Discharge: 2021-05-15 | Disposition: A | Discharge status: Home / Self Care | Payer: Medicare HMO | Source: Ambulatory Visit | Attending: Physician Assistant | Admitting: Physician Assistant

## 2021-05-15 ENCOUNTER — Encounter (HOSPITAL_COMMUNITY): Payer: Self-pay | Admitting: Physician Assistant

## 2021-05-15 ENCOUNTER — Other Ambulatory Visit: Payer: Self-pay

## 2021-05-15 VITALS — BP 118/74 | HR 71 | Ht 63.0 in | Wt 172.2 lb

## 2021-05-15 DIAGNOSIS — E669 Obesity, unspecified: Secondary | ICD-10-CM | POA: Insufficient documentation

## 2021-05-15 DIAGNOSIS — I2510 Atherosclerotic heart disease of native coronary artery without angina pectoris: Secondary | ICD-10-CM | POA: Insufficient documentation

## 2021-05-15 DIAGNOSIS — D6869 Other thrombophilia: Secondary | ICD-10-CM | POA: Diagnosis not present

## 2021-05-15 DIAGNOSIS — Z79899 Other long term (current) drug therapy: Secondary | ICD-10-CM | POA: Insufficient documentation

## 2021-05-15 DIAGNOSIS — E119 Type 2 diabetes mellitus without complications: Secondary | ICD-10-CM | POA: Insufficient documentation

## 2021-05-15 DIAGNOSIS — Z713 Dietary counseling and surveillance: Secondary | ICD-10-CM | POA: Diagnosis not present

## 2021-05-15 DIAGNOSIS — I4821 Permanent atrial fibrillation: Secondary | ICD-10-CM | POA: Diagnosis not present

## 2021-05-15 DIAGNOSIS — Z6835 Body mass index (BMI) 35.0-35.9, adult: Secondary | ICD-10-CM | POA: Diagnosis not present

## 2021-05-15 DIAGNOSIS — I4811 Longstanding persistent atrial fibrillation: Secondary | ICD-10-CM | POA: Insufficient documentation

## 2021-05-15 DIAGNOSIS — I10 Essential (primary) hypertension: Secondary | ICD-10-CM | POA: Insufficient documentation

## 2021-05-15 DIAGNOSIS — Z7901 Long term (current) use of anticoagulants: Secondary | ICD-10-CM | POA: Insufficient documentation

## 2021-05-15 NOTE — Telephone Encounter (Signed)
Daughter would like to speak to Veronica Paul about Eliquis getting denied. She said they received something in the mail stating that the medicine was denied because they did not turn in all of the required paperwork. Please call back. Leave message on voicemail if there is no answer.  ?

## 2021-05-16 NOTE — Telephone Encounter (Signed)
Forms were placed in my box ?I completed & faxed patient's application for eliquis patient assistance ?I did not see patient in clinic so I was not following application (did this as a favor) ?Patient can call [facility] patient assistance at [phone] to check on the status & see what she needs to include ?

## 2021-05-16 NOTE — Telephone Encounter (Signed)
Daughter aware.

## 2021-06-03 NOTE — Telephone Encounter (Signed)
Ust have her seat and elevate her legs when they start swelling. Melatonin OTC is fine for her to take for sleep. ?

## 2021-06-04 ENCOUNTER — Ambulatory Visit: Payer: Medicare HMO | Admitting: Nurse Practitioner

## 2021-06-10 ENCOUNTER — Other Ambulatory Visit: Payer: Self-pay | Admitting: Nurse Practitioner

## 2021-06-10 DIAGNOSIS — I4891 Unspecified atrial fibrillation: Secondary | ICD-10-CM

## 2021-06-19 ENCOUNTER — Telehealth: Payer: Self-pay

## 2021-06-19 ENCOUNTER — Encounter: Payer: Self-pay | Admitting: Pharmacist

## 2021-06-19 NOTE — Telephone Encounter (Signed)
Called [employer]MS to f/u on patient application status. ? ?Received notification from [place] ([place]) regarding patient assistance DENIAL for [employer].  ? ?BASED OFF INCOME OF $34,500, PT NEEDS TO SPEND $580.69 OOP TO BE ELIGIBLE FOR ASSISTANCE. IF INCOME IS INCORRECT, PT CAN PROVIDE 2023 TAX RETURN. ? ?Phone:[phone] ? ?

## 2021-06-24 ENCOUNTER — Encounter: Payer: Self-pay | Admitting: Nurse Practitioner

## 2021-06-24 ENCOUNTER — Ambulatory Visit (INDEPENDENT_AMBULATORY_CARE_PROVIDER_SITE_OTHER): Payer: Medicare HMO | Admitting: Nurse Practitioner

## 2021-06-24 VITALS — BP 131/74 | HR 80 | Temp 97.0°F | Resp 20 | Ht 63.0 in | Wt 168.0 lb

## 2021-06-24 DIAGNOSIS — E119 Type 2 diabetes mellitus without complications: Secondary | ICD-10-CM

## 2021-06-24 DIAGNOSIS — R609 Edema, unspecified: Secondary | ICD-10-CM | POA: Diagnosis not present

## 2021-06-24 DIAGNOSIS — R1319 Other dysphagia: Secondary | ICD-10-CM | POA: Diagnosis not present

## 2021-06-24 DIAGNOSIS — I10 Essential (primary) hypertension: Secondary | ICD-10-CM | POA: Diagnosis not present

## 2021-06-24 DIAGNOSIS — E039 Hypothyroidism, unspecified: Secondary | ICD-10-CM

## 2021-06-24 DIAGNOSIS — R300 Dysuria: Secondary | ICD-10-CM

## 2021-06-24 DIAGNOSIS — Z6829 Body mass index (BMI) 29.0-29.9, adult: Secondary | ICD-10-CM

## 2021-06-24 DIAGNOSIS — K582 Mixed irritable bowel syndrome: Secondary | ICD-10-CM

## 2021-06-24 DIAGNOSIS — E876 Hypokalemia: Secondary | ICD-10-CM | POA: Diagnosis not present

## 2021-06-24 DIAGNOSIS — I4891 Unspecified atrial fibrillation: Secondary | ICD-10-CM

## 2021-06-24 DIAGNOSIS — R69 Illness, unspecified: Secondary | ICD-10-CM | POA: Diagnosis not present

## 2021-06-24 DIAGNOSIS — F3341 Major depressive disorder, recurrent, in partial remission: Secondary | ICD-10-CM

## 2021-06-24 DIAGNOSIS — E782 Mixed hyperlipidemia: Secondary | ICD-10-CM

## 2021-06-24 DIAGNOSIS — F419 Anxiety disorder, unspecified: Secondary | ICD-10-CM

## 2021-06-24 LAB — URINALYSIS, COMPLETE
Bilirubin, UA: NEGATIVE
Glucose, UA: NEGATIVE
Ketones, UA: NEGATIVE
Leukocytes,UA: NEGATIVE
Nitrite, UA: NEGATIVE
Protein,UA: NEGATIVE
RBC, UA: NEGATIVE
Specific Gravity, UA: 1.01 (ref 1.005–1.030)
Urobilinogen, Ur: 0.2 mg/dL (ref 0.2–1.0)
pH, UA: 7 (ref 5.0–7.5)

## 2021-06-24 LAB — MICROSCOPIC EXAMINATION
Bacteria, UA: NONE SEEN
RBC, Urine: NONE SEEN /hpf (ref 0–2)
Renal Epithel, UA: NONE SEEN /hpf

## 2021-06-24 LAB — BAYER DCA HB A1C WAIVED: HB A1C (BAYER DCA - WAIVED): 5.8 % — ABNORMAL HIGH (ref 4.8–5.6)

## 2021-06-24 MED ORDER — FUROSEMIDE 20 MG PO TABS: 20.0000 mg | ORAL_TABLET | Freq: Every day | ORAL | 1 refills | Status: DC

## 2021-06-24 MED ORDER — LEVOTHYROXINE SODIUM 88 MCG PO TABS: 88.0000 ug | ORAL_TABLET | Freq: Every day | ORAL | 1 refills | Status: DC

## 2021-06-24 MED ORDER — DILTIAZEM HCL 90 MG PO TABS: 90.0000 mg | ORAL_TABLET | Freq: Three times a day (TID) | ORAL | 2 refills | Status: DC

## 2021-06-24 MED ORDER — SERTRALINE HCL 50 MG PO TABS: 50.0000 mg | ORAL_TABLET | Freq: Every day | ORAL | 1 refills | Status: DC

## 2021-06-24 MED ORDER — OMEPRAZOLE 20 MG PO CPDR: 20.0000 mg | DELAYED_RELEASE_CAPSULE | Freq: Every day | ORAL | 1 refills | Status: DC

## 2021-06-24 MED ORDER — METOPROLOL TARTRATE 100 MG PO TABS: 150.0000 mg | ORAL_TABLET | Freq: Two times a day (BID) | ORAL | 1 refills | Status: DC

## 2021-06-24 NOTE — Progress Notes (Signed)
? ?Subjective:  ? ? Patient ID: Veronica Paul, female    DOB: 1935/06/12, 86 y.o.   MRN: [id number] ? ? ?Chief Complaint: medical management of chronic issues  ?  ? ?HPI: ? ?Veronica Paul is a 86 y.o. who identifies as a female who was assigned female at birth.  ? ?Social history: ?Lives with: by herself- her daughters check on her daily ?Work history: retired ? ? ?Comes in today for follow up of the following chronic medical issues: ? ?1. Primary hypertension ?No c/o chest pain, sob or headache. Does not check blood pressure at home. ?BP Readings from Last 3 Encounters:  ?05/15/21 118/74  ?05/07/21 135/66  ?05/01/21 (!) 142/84  ? ? ? ?2. Atrial fibrillation with RVR ([place]) ?No palpitations or tachycardia ? ?3. Irritable bowel syndrome with both constipation and diarrhea ?Has frequent abdominal pain. We are still not sure of cause. She has had multiple test run which were al negative. We really feel that her pain is coming from gas. She denies any constipation or diarrhea. ? ?4. Diabetes mellitus without complication ([place]) ?She does not check her blood sugars at home. She eats what ever her family brings he rto eat. ?Lab Results  ?Component Value Date  ? HGBA1C 6.1 (H) 03/07/2021  ? ? ? ?5. Acquired hypothyroidism ?No problems that she is aware of. ?Lab Results  ?Component Value Date  ? TSH 5.756 (H) 05/01/2021  ? ? ? ?6. Mixed hyperlipidemia ?Doe snot watch diet and does no dedicated exercise. ?Lab Results  ?Component Value Date  ? CHOL 202 (H) 11/21/2020  ? HDL 57 11/21/2020  ? LDLCALC 127 (H) 11/21/2020  ? TRIG 103 11/21/2020  ? CHOLHDL 3.5 11/21/2020  ? ? ? ?7. Hypokalemia ?Denies any muscle cramps ?Lab Results  ?Component Value Date  ? K 3.8 05/01/2021  ? ? ? ?8. Esophageal dysphagia ?Is on omeprazole which seems to help. ? ?9. Anxiety ?Is on zoloft daily and is working well. ? ?  06/24/2021  ?  9:49 AM 05/07/2021  ?  8:49 AM 03/07/2021  ? 10:04 AM 11/21/2020  ? 10:48 AM  ?GAD 7 : Generalized Anxiety Score   ?Nervous, Anxious, on Edge 0 0 1 1  ?Control/stop worrying 0 0 0 0  ?Worry too much - different things 0 0 1 1  ?Trouble relaxing 0 0 1 0  ?Restless 0 0 0 0  ?Easily annoyed or irritable 0 0 0 0  ?Afraid - awful might happen 0 0 0 0  ?Total GAD 7 Score 0 0 3 2  ?Anxiety Difficulty Not difficult at all Not difficult at all Not difficult at all Not difficult at all  ? ? ? ? ?10. Recurrent major depressive disorder, in partial remission ([place]) ?Is on zoloft and says she is doing ok. Has some bad days but overall ok. ? ?  06/24/2021  ?  9:48 AM 05/07/2021  ?  8:49 AM 03/07/2021  ? 10:04 AM  ?Depression screen PHQ 2/9  ?Decreased Interest 0 0 0  ?Down, Depressed, Hopeless 0 0 0  ?PHQ - 2 Score 0 0 0  ?Altered sleeping 0 0 1  ?Tired, decreased energy 1 0 1  ?Change in appetite 0 0 0  ?Feeling bad or failure about yourself  0 0 0  ?Trouble concentrating 0 0 0  ?Moving slowly or fidgety/restless 1 0 1  ?Suicidal thoughts 0 0 0  ?PHQ-9 Score 2 0 3  ?Difficult doing work/chores Not difficult at  all Not difficult at all Somewhat difficult  ? ? ? ?12. Peripheral edema ?Has edema daily. Does prop legs up much when sitting. ? ?13. BMI 30.0-30.9 ?Weight is down 4lbs ?Wt Readings from Last 3 Encounters:  ?06/24/21 168 lb (76.2 kg)  ?05/15/21 172 lb 3.2 oz (78.1 kg)  ?05/07/21 171 lb (77.6 kg)  ? ?BMI Readings from Last 3 Encounters:  ?06/24/21 29.76 kg/m?  ?05/15/21 30.50 kg/m?  ?05/07/21 30.29 kg/m?  ? ? ? ? ? ?New complaints: ?None today ? ?Allergies  ?Allergen Reactions  ? Ace Inhibitors Cough  ? Feldene [Piroxicam]   ?  REACTION: RASH TO HANDS  ? Lexapro [Escitalopram]   ? Meloxicam Nausea And Vomiting  ? Prevnar [Pneumococcal 13-Val Conj Vacc]   ? Statins Other (See Comments)  ?  myalgia  ? Sulfa Antibiotics   ?  Rash ?  ? Zithromax [Azithromycin Dihydrate] Itching  ? ?Outpatient Encounter Medications as of 06/24/2021  ?Medication Sig  ? acetaminophen (TYLENOL) 500 MG tablet Take 500 mg by mouth every 6 (six) hours as needed for  moderate pain, headache or fever.  ? bisacodyl (DULCOLAX) 5 MG EC tablet Take 5 mg by mouth daily as needed for moderate constipation (usually twice per week).  ? Blood Glucose Monitoring Suppl (ONETOUCH VERIO) w/Device KIT Check blood sugars daily Dx E11.9  ? diltiazem (CARDIZEM) 90 MG tablet Take 1 tablet (90 mg total) by mouth 3 (three) times daily.  ? ELIQUIS 5 MG TABS tablet TAKE 1 TABLET BY MOUTH TWICE A DAY  ? feeding supplement (ENSURE ENLIVE / ENSURE PLUS) LIQD Take 237 mLs by mouth 3 (three) times daily between meals.  ? furosemide (LASIX) 20 MG tablet Take 1 tablet (20 mg total) by mouth daily.  ? Lancet Devices (ONETOUCH DELICA PLUS LANCING) MISC 1 Device by Does not apply route daily.  ? Lancets (ONETOUCH DELICA PLUS [id number]) MISC USE TO CHECK BLOOD SUGARS DAILY DX E11.9  ? levothyroxine (SYNTHROID) 88 MCG tablet Take 1 tablet (88 mcg total) by mouth daily.  ? metoprolol tartrate (LOPRESSOR) 100 MG tablet Take 1.5 tablets (150 mg total) by mouth 2 (two) times daily.  ? nitroGLYCERIN (NITROSTAT) 0.4 MG SL tablet Place 1 tablet (0.4 mg total) under the tongue every 5 (five) minutes as needed for chest pain.  ? nystatin (MYCOSTATIN/NYSTOP) powder Apply 1 application. topically 3 (three) times daily.  ? omeprazole (PRILOSEC) 20 MG capsule Take 20 mg by mouth daily.  ? ONETOUCH VERIO test strip CHECK BLOOD SUGARS DAILY DX E11.9  ? polyethylene glycol (MIRALAX / GLYCOLAX) 17 g packet Take 17 g by mouth daily as needed for mild constipation.  ? sertraline (ZOLOFT) 50 MG tablet TAKE 1 TABLET BY MOUTH EVERY DAY  ? triamcinolone cream (KENALOG) 0.1 % Apply topically.  ? ?No facility-administered encounter medications on file as of 06/24/2021.  ? ? ?Past Surgical History:  ?Procedure Laterality Date  ? CATARACT EXTRACTION Bilateral   ? COLONOSCOPY    ? COLONOSCOPY WITH PROPOFOL N/A 05/06/2019  ? Procedure: COLONOSCOPY WITH PROPOFOL;  Surgeon: Jerene Bears, MD;  Location: [facility];  Service: Gastroenterology;   Laterality: N/A;  ? LUNG REMOVAL, PARTIAL  1998  ? Right  ? POLYPECTOMY  05/06/2019  ? Procedure: POLYPECTOMY;  Surgeon: Jerene Bears, MD;  Location: [facility];  Service: Gastroenterology;;  ? THYROIDECTOMY    ? TUBAL LIGATION    ? UPPER GASTROINTESTINAL ENDOSCOPY    ? ? ?Family History  ?Problem Relation Age of  Onset  ? Coronary artery disease Brother 51  ? Hypertension Brother   ? Heart disease Brother   ? Hypertension Mother   ? Hypertension Father   ? Heart disease Father   ? Hypertension Sister   ? Heart disease Sister   ? Cancer Sister   ? Arthritis Daughter   ? COPD Daughter   ? Fibromyalgia Daughter   ? Ulcerative colitis Son   ? Stroke Maternal Grandmother   ? Cancer Brother   ? Lung cancer Brother   ? Hypertension Sister   ? Cancer Sister   ? Colon cancer Neg Hx   ? Food intolerance Neg Hx   ? Esophageal cancer Neg Hx   ? Rectal cancer Neg Hx   ? Stomach cancer Neg Hx   ? Colon polyps Neg Hx   ? ? ? ? ?Controlled substance contract: n/a ? ? ? ? ?Review of Systems  ?Constitutional:  Negative for diaphoresis.  ?Eyes:  Negative for pain.  ?Respiratory:  Negative for shortness of breath.   ?Cardiovascular:  Negative for chest pain, palpitations and leg swelling.  ?Gastrointestinal:  Negative for abdominal pain.  ?Endocrine: Negative for polydipsia.  ?Skin:  Negative for rash.  ?Neurological:  Negative for dizziness, weakness and headaches.  ?Hematological:  Does not bruise/bleed easily.  ?All other systems reviewed and are negative. ? ?   ?Objective:  ? Physical Exam ?Vitals and nursing note reviewed.  ?Constitutional:   ?   General: She is not in acute distress. ?   Appearance: Normal appearance. She is well-developed.  ?HENT:  ?   Head: Normocephalic.  ?   Right Ear: Tympanic membrane normal.  ?   Left Ear: Tympanic membrane normal.  ?   Nose: Nose normal.  ?   Mouth/Throat:  ?   Mouth: Mucous membranes are moist.  ?Eyes:  ?   Pupils: Pupils are equal, round, and reactive to light.  ?Neck:  ?   Vascular:  No carotid bruit or JVD.  ?Cardiovascular:  ?   Rate and Rhythm: Normal rate and regular rhythm.  ?   Heart sounds: Normal heart sounds.  ?Pulmonary:  ?   Effort: Pulmonary effort is normal. No respiratory distre

## 2021-06-25 LAB — LIPID PANEL
Chol/HDL Ratio: 3.1 ratio (ref 0.0–4.4)
Cholesterol, Total: 177 mg/dL (ref 100–199)
HDL: 58 mg/dL (ref >39)
LDL Chol Calc (NIH): 105 mg/dL — ABNORMAL HIGH (ref 0–99)
Triglycerides: 76 mg/dL (ref 0–149)
VLDL Cholesterol Cal: 14 mg/dL (ref 5–40)

## 2021-06-25 LAB — CMP14+EGFR
ALT: 16 IU/L (ref 0–32)
AST: 24 IU/L (ref 0–40)
Albumin/Globulin Ratio: 1.7 (ref 1.2–2.2)
Albumin: 4.4 g/dL (ref 3.6–4.6)
Alkaline Phosphatase: 86 IU/L (ref 44–121)
BUN/Creatinine Ratio: 13 (ref 12–28)
BUN: 13 mg/dL (ref 8–27)
Bilirubin Total: 0.5 mg/dL (ref 0.0–1.2)
CO2: 27 mmol/L (ref 20–29)
Calcium: 9.4 mg/dL (ref 8.7–10.3)
Chloride: 100 mmol/L (ref 96–106)
Creatinine, Ser: 1.02 mg/dL — ABNORMAL HIGH (ref 0.57–1.00)
Globulin, Total: 2.6 g/dL (ref 1.5–4.5)
Glucose: 111 mg/dL — ABNORMAL HIGH (ref 70–99)
Potassium: 4.3 mmol/L (ref 3.5–5.2)
Sodium: 142 mmol/L (ref 134–144)
Total Protein: 7 g/dL (ref 6.0–8.5)
eGFR: 55 mL/min/{1.73_m2} — ABNORMAL LOW (ref >59)

## 2021-06-25 LAB — CBC WITH DIFFERENTIAL/PLATELET
Basophils Absolute: 0.1 10*3/uL (ref 0.0–0.2)
Basos: 2 %
EOS (ABSOLUTE): 0.5 10*3/uL — ABNORMAL HIGH (ref 0.0–0.4)
Eos: 8 %
Hematocrit: 40.6 % (ref 34.0–46.6)
Hemoglobin: 13.7 g/dL (ref 11.1–15.9)
Immature Grans (Abs): 0 10*3/uL (ref 0.0–0.1)
Immature Granulocytes: 0 %
Lymphocytes Absolute: 1.1 10*3/uL (ref 0.7–3.1)
Lymphs: 16 %
MCH: 28.5 pg (ref 26.6–33.0)
MCHC: 33.7 g/dL (ref 31.5–35.7)
MCV: 85 fL (ref 79–97)
Monocytes Absolute: 0.6 10*3/uL (ref 0.1–0.9)
Monocytes: 9 %
Neutrophils Absolute: 4.4 10*3/uL (ref 1.4–7.0)
Neutrophils: 65 %
Platelets: 254 10*3/uL (ref 150–450)
RBC: 4.8 x10E6/uL (ref 3.77–5.28)
RDW: 12.5 % (ref 11.7–15.4)
WBC: 6.6 10*3/uL (ref 3.4–10.8)

## 2021-06-25 LAB — THYROID PANEL WITH TSH
Free Thyroxine Index: 2.8 (ref 1.2–4.9)
T3 Uptake Ratio: 35 % (ref 24–39)
T4, Total: 7.9 ug/dL (ref 4.5–12.0)
TSH: 2.23 u[IU]/mL (ref 0.450–4.500)

## 2021-06-25 LAB — URINE CULTURE

## 2021-07-01 ENCOUNTER — Telehealth: Payer: Self-pay | Admitting: Cardiology

## 2021-07-01 DIAGNOSIS — I4891 Unspecified atrial fibrillation: Secondary | ICD-10-CM | POA: Diagnosis not present

## 2021-07-01 DIAGNOSIS — D6869 Other thrombophilia: Secondary | ICD-10-CM | POA: Diagnosis not present

## 2021-07-01 DIAGNOSIS — K219 Gastro-esophageal reflux disease without esophagitis: Secondary | ICD-10-CM | POA: Diagnosis not present

## 2021-07-01 DIAGNOSIS — R32 Unspecified urinary incontinence: Secondary | ICD-10-CM | POA: Diagnosis not present

## 2021-07-01 DIAGNOSIS — E119 Type 2 diabetes mellitus without complications: Secondary | ICD-10-CM | POA: Diagnosis not present

## 2021-07-01 DIAGNOSIS — I110 Hypertensive heart disease with heart failure: Secondary | ICD-10-CM | POA: Diagnosis not present

## 2021-07-01 DIAGNOSIS — E039 Hypothyroidism, unspecified: Secondary | ICD-10-CM | POA: Diagnosis not present

## 2021-07-01 DIAGNOSIS — E261 Secondary hyperaldosteronism: Secondary | ICD-10-CM | POA: Diagnosis not present

## 2021-07-01 DIAGNOSIS — R69 Illness, unspecified: Secondary | ICD-10-CM | POA: Diagnosis not present

## 2021-07-01 DIAGNOSIS — I509 Heart failure, unspecified: Secondary | ICD-10-CM | POA: Diagnosis not present

## 2021-07-01 IMAGING — US US PELVIS COMPLETE
1 series · 14 of 25 positions shown · non-contrast
Comparison: None.

CLINICAL DATA: Bloating for 2 weeks.  Calcifications seen on KUB.

EXAM:
TRANSABDOMINAL ULTRASOUND OF PELVIS
TECHNIQUE: Transabdominal ultrasound examination of the pelvis was performed
including evaluation of the uterus, ovaries, adnexal regions, and
pelvic cul-de-sac.

[Series 1: us pelvis complete · 41 acquisitions, 14 frames shown]
[im 1/41]
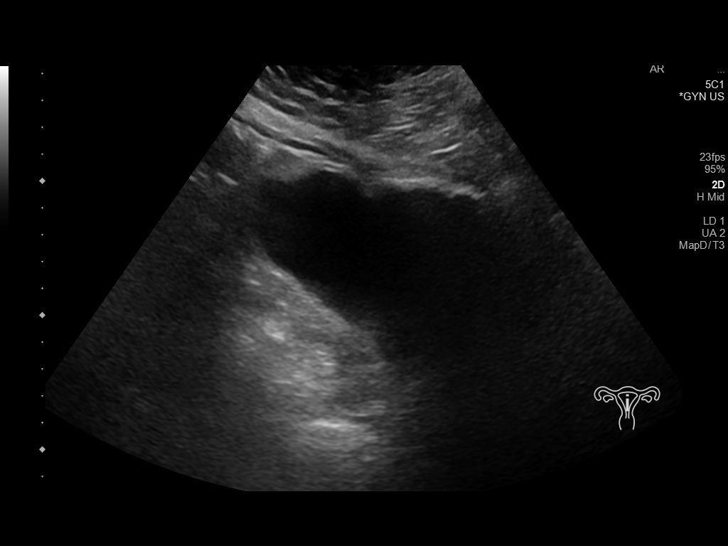
[im 4/41]
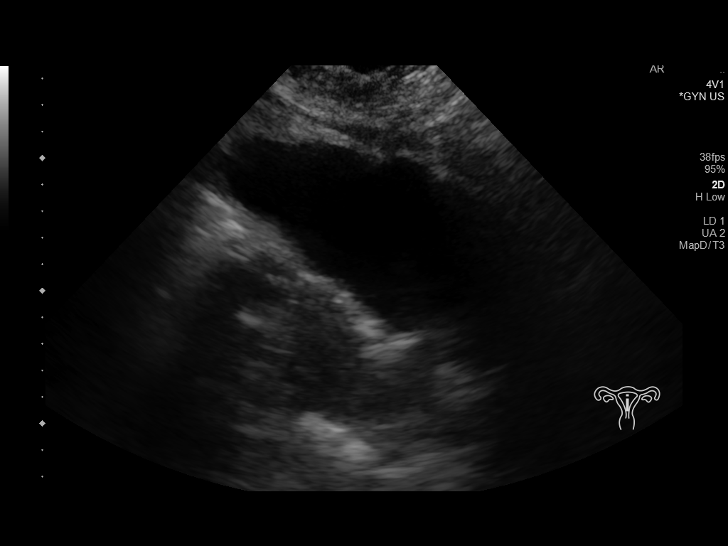
[im 7/41]
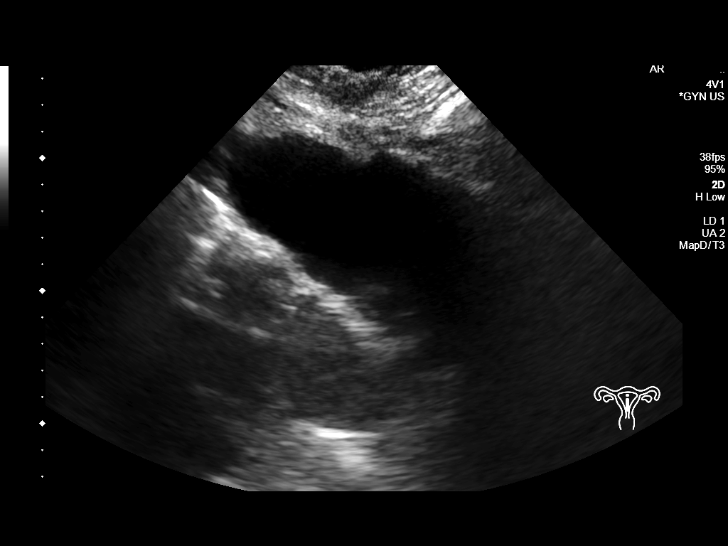
[im 11/41]
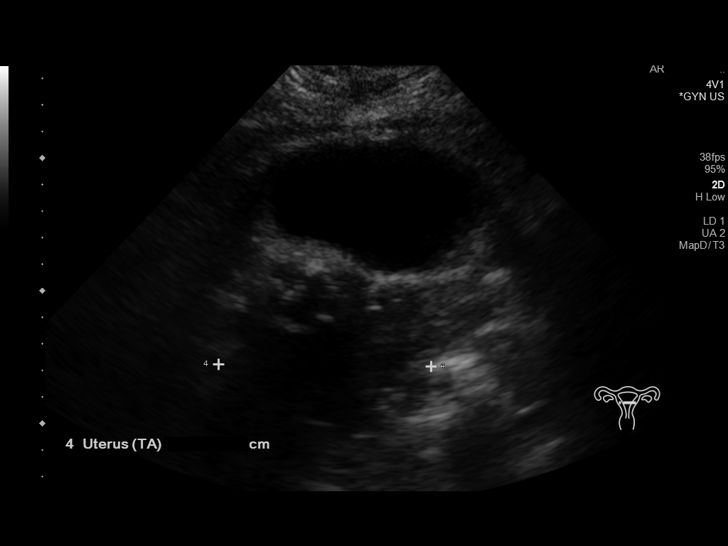
[im 14/41]
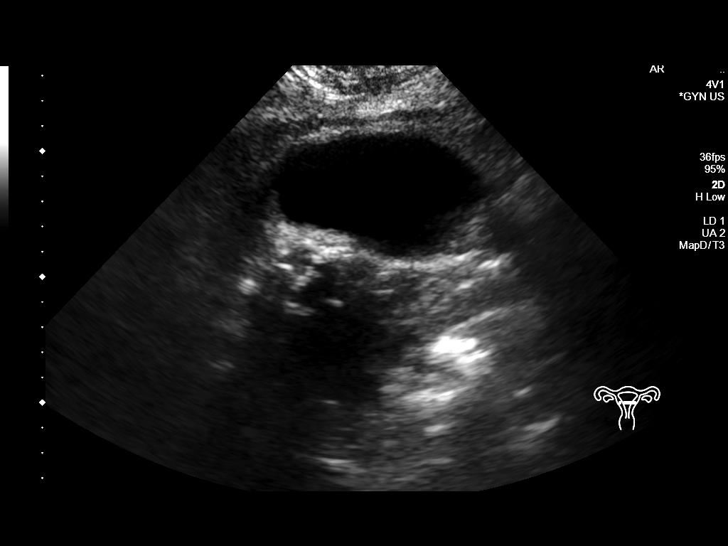
[im 16/41]
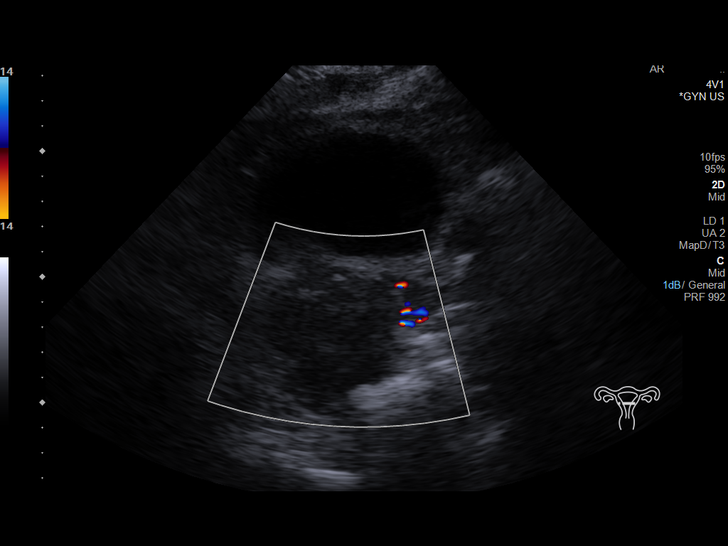
[im 19/41]
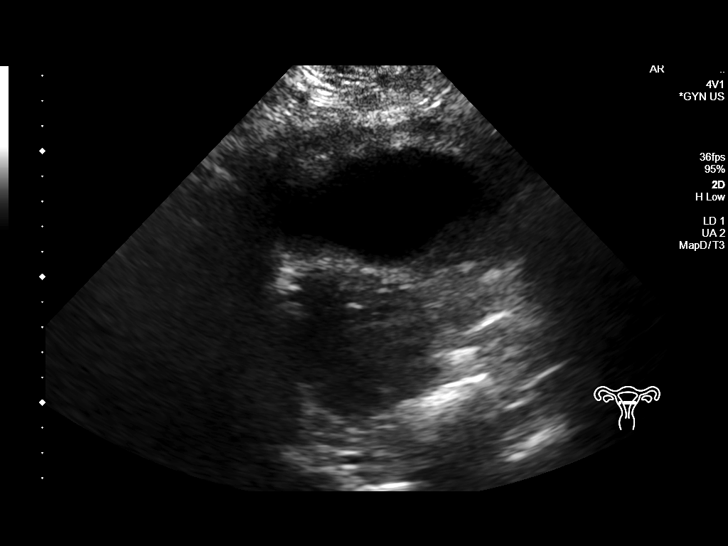
[im 22/41]
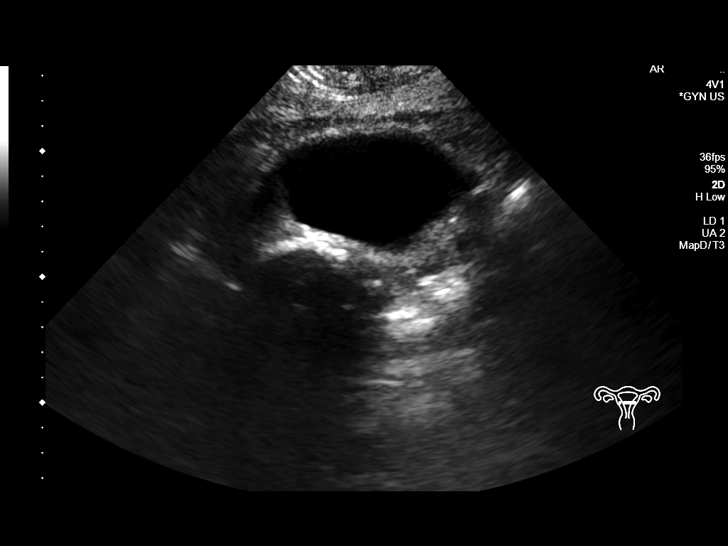
[im 26/41]
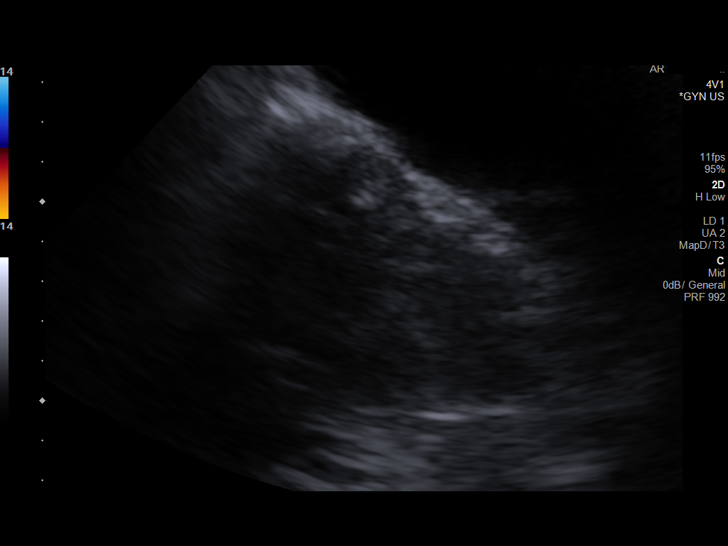
[im 27/41]
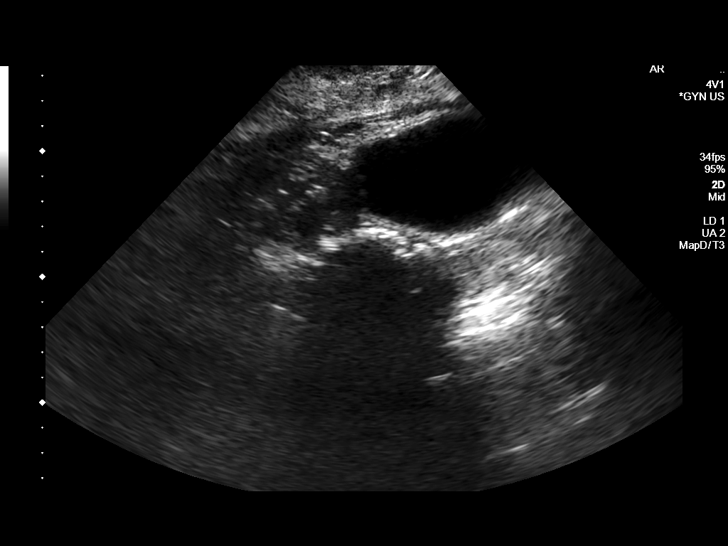
[im 31/41]
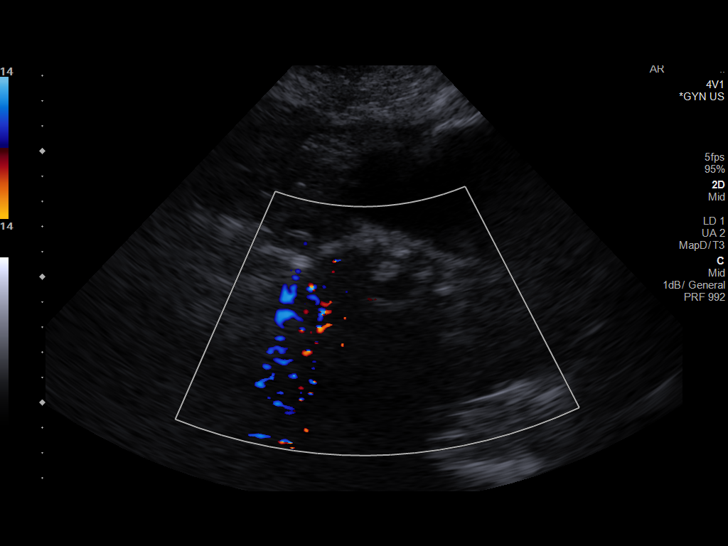
[im 34/41]
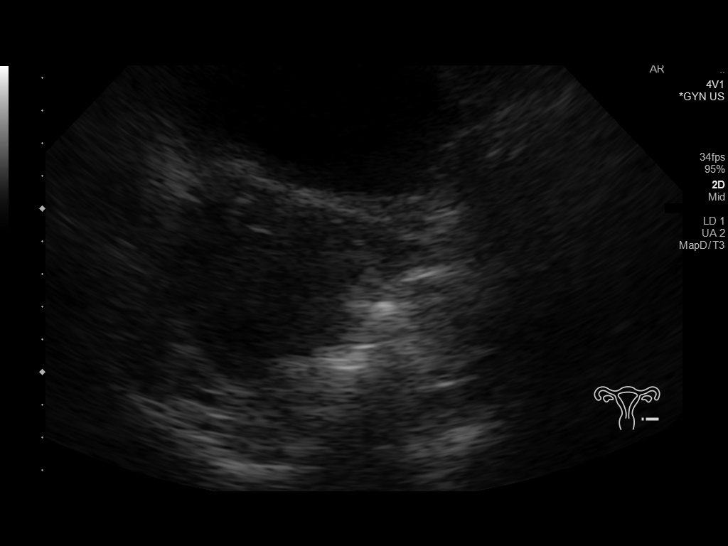
[im 37/41]
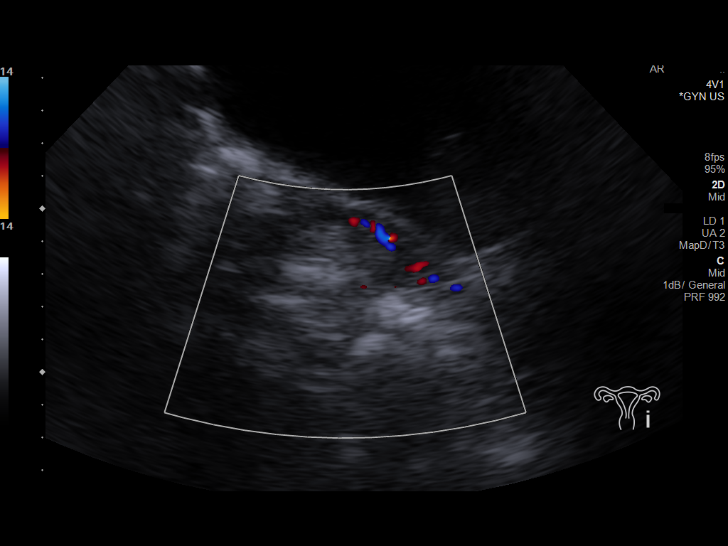
[im 41/41]
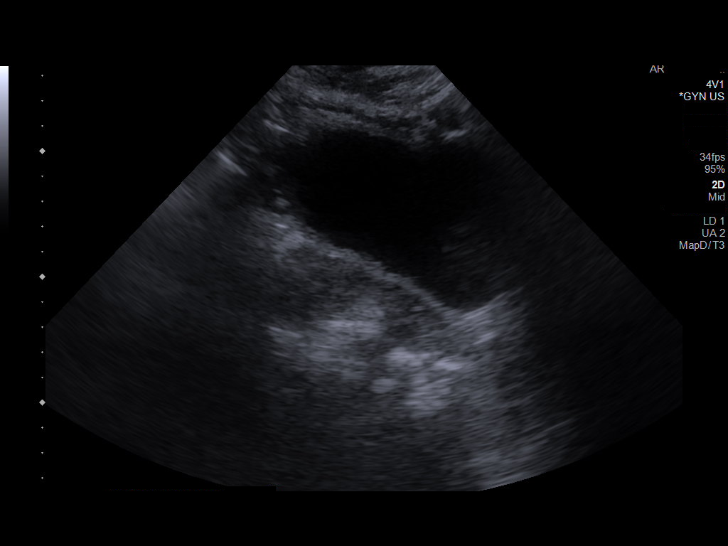

[14 of 25 positions shown; findings below may reference images not displayed]

FINDINGS: Uterus

Measurements: 10.4 x 5 x 7.7 cm = volume: 211 mL. No fibroids or
other mass visualized.

Endometrium

Thickness: Not well seen. No obvious thickening of the endometrial
complex. No obvious mass or fluid within the endometrial canal.

Right ovary

Measurements: Not seen. No mass or free fluid demonstrated within
the RIGHT adnexal region.

Left ovary

Measurements: Not seen. No mass or free fluid demonstrated within
the LEFT adnexal region.

Other findings: No abnormal free fluid. No correlate seen for the
partially calcified mass in the RIGHT hemipelvis on plain film of
09/23/2018.
IMPRESSION: 1. No acute findings. Limited exam, as detailed above.
2. No correlate identified for the partially calcified mass in the
RIGHT hemipelvis on plain film of 09/23/2018, presumably large
calcified fibroid, presumably obscured by overlying bowel gas.

## 2021-07-01 NOTE — Telephone Encounter (Signed)
Received a call from patient's daughter Neoma Laming she stated mother takes Diltiazem 90 mg three times a day.Stated she accidentally took pm dose and night dose together.Advised do not take night dose tonight.Advised ok to take tomorrow's doses as normal.I will make Dr.Hochrein aware. ?

## 2021-07-01 NOTE — Telephone Encounter (Signed)
Pt c/o medication issue: ? ?1. Name of Medication: diltiazem (CARDIZEM) 90 MG tablet ? ?2. How are you currently taking this medication (dosage and times per day)? 1 tablet 3 times a day ? ?3. Are you having a reaction (difficulty breathing--STAT)? no ? ?4. What is your medication issue? Patient's daughter states the patient took a tablet at 2pm and another one at 3:30 pm. She states the patient had not released and is scared, but does not have any symptoms.  ?

## 2021-07-03 ENCOUNTER — Other Ambulatory Visit: Payer: Self-pay | Admitting: Nurse Practitioner

## 2021-07-03 MED ORDER — MELATONIN 10 MG PO CAPS: 1.0000 | ORAL_CAPSULE | Freq: Every evening | ORAL | 2 refills | Status: DC | PRN

## 2021-07-03 NOTE — Progress Notes (Signed)
melatonin ?

## 2021-07-05 ENCOUNTER — Telehealth: Payer: Self-pay | Admitting: *Deleted

## 2021-07-05 NOTE — Telephone Encounter (Signed)
   Pre-operative Risk Assessment    Patient Name: Veronica Paul  DOB: 1935-09-14 MRN: [id number]      Request for Surgical Clearance    Procedure:  nail margins, both sides of both big toes, removed surgically  Date of Surgery:  Clearance TBD                                 Surgeon:  cody drake pa Surgeon's Group or Practice Name:  [facility] Phone number:  [phone] Fax number:  [phone]   Type of Clearance Requested:   - Pharmacy:  Hold Apixaban (Eliquis) they need direction   Type of Anesthesia:  Not Indicated   Additional requests/questions:    Arman Filter   07/05/2021, 4:32 PM

## 2021-07-08 NOTE — Telephone Encounter (Signed)
   Primary Cardiologist: Minus Breeding, MD  Chart reviewed as part of pre-operative protocol coverage. Given past medical history and time since last visit, based on ACC/AHA guidelines, [place] would be at acceptable risk for the planned procedure without further cardiovascular testing.   Patient may hold Eliquis for 1 day prior to procedure and should resume as soon as hemodynamically stable following procedure.   I will route this recommendation to the requesting party via Epic fax function and remove from pre-op pool.  Please call with questions.  Emmaline Life, NP-C    07/08/2021, 4:02 PM [place] [address], [address] Office [phone] Fax [phone]

## 2021-07-08 NOTE — Telephone Encounter (Signed)
Patient with diagnosis of afib on Eliquis for anticoagulation.    Procedure: nail margins, both sides of both big toes, removed surgically Date of procedure: TBD  CHA2DS2-VASc Score = 6  This indicates a 9.7% annual risk of stroke. The patient's score is based upon: CHF History: 0 HTN History: 1 Diabetes History: 1 Stroke History: 0 Vascular Disease History: 1 Age Score: 2 Gender Score: 1   HTN, CAD, DM  CrCl 34mL/min Platelet count 254K  Per office protocol, patient can hold Eliquis for 1 day prior to procedure.

## 2021-07-15 ENCOUNTER — Other Ambulatory Visit: Payer: Self-pay | Admitting: Nurse Practitioner

## 2021-07-15 DIAGNOSIS — E039 Hypothyroidism, unspecified: Secondary | ICD-10-CM

## 2021-07-16 ENCOUNTER — Telehealth: Payer: Self-pay | Admitting: Cardiology

## 2021-07-16 DIAGNOSIS — L03031 Cellulitis of right toe: Secondary | ICD-10-CM | POA: Diagnosis not present

## 2021-07-16 DIAGNOSIS — M79674 Pain in right toe(s): Secondary | ICD-10-CM | POA: Diagnosis not present

## 2021-07-16 DIAGNOSIS — M79675 Pain in left toe(s): Secondary | ICD-10-CM | POA: Diagnosis not present

## 2021-07-16 DIAGNOSIS — L03032 Cellulitis of left toe: Secondary | ICD-10-CM | POA: Diagnosis not present

## 2021-07-16 NOTE — Telephone Encounter (Signed)
New message:    Patient was off of her Eliquis for her procedure today. When can she go back on her Eliquis?

## 2021-07-16 NOTE — Telephone Encounter (Signed)
Pt had toe surgery today and wanted to know when to resume eliquis.  I explained I did not know how surgery went so it would be up to the surgeon.  To call their office.  They were stressed but agreed to call.

## 2021-07-18 ENCOUNTER — Encounter: Payer: Self-pay | Admitting: Nurse Practitioner

## 2021-07-18 ENCOUNTER — Ambulatory Visit (INDEPENDENT_AMBULATORY_CARE_PROVIDER_SITE_OTHER): Payer: Medicare HMO | Admitting: Nurse Practitioner

## 2021-07-18 VITALS — BP 133/78 | HR 125 | Temp 97.4°F | Resp 20 | Ht 63.0 in | Wt 158.0 lb

## 2021-07-18 DIAGNOSIS — E860 Dehydration: Secondary | ICD-10-CM

## 2021-07-18 DIAGNOSIS — R110 Nausea: Secondary | ICD-10-CM | POA: Diagnosis not present

## 2021-07-18 DIAGNOSIS — R531 Weakness: Secondary | ICD-10-CM | POA: Diagnosis not present

## 2021-07-18 LAB — URINALYSIS, COMPLETE
Bilirubin, UA: NEGATIVE
Glucose, UA: NEGATIVE
Ketones, UA: NEGATIVE
Leukocytes,UA: NEGATIVE
Nitrite, UA: NEGATIVE
Specific Gravity, UA: 1.01 (ref 1.005–1.030)
Urobilinogen, Ur: 1 mg/dL (ref 0.2–1.0)
pH, UA: 7 (ref 5.0–7.5)

## 2021-07-18 LAB — MICROSCOPIC EXAMINATION: Renal Epithel, UA: NONE SEEN /hpf

## 2021-07-18 MED ORDER — ONDANSETRON 4 MG PO TBDP: 4.0000 mg | ORAL_TABLET | Freq: Three times a day (TID) | ORAL | 1 refills | Status: DC | PRN

## 2021-07-18 NOTE — Progress Notes (Signed)
   Subjective:    Patient ID: Veronica Paul, female    DOB: 1935/09/08, 86 y.o.   MRN: [id number]   Chief Complaint: sore throat, fatigue, and poor appetite  Sore Throat  Pertinent negatives include no abdominal pain, headaches or shortness of breath.  Patient brought in by her daughter. She has been c/o sore thorat for over a week. She has had a very poor appetite and is weak. They have had problems getting her to take her meds , and they have been crushing them and giving it to her in applesauce. She has become very weak over the last few days. Drinks an occasion ensure.    Review of Systems  Constitutional:  Negative for diaphoresis.  Eyes:  Negative for pain.  Respiratory:  Negative for shortness of breath.   Cardiovascular:  Negative for chest pain, palpitations and leg swelling.  Gastrointestinal:  Negative for abdominal pain.  Endocrine: Negative for polydipsia.  Skin:  Negative for rash.  Neurological:  Negative for dizziness, weakness and headaches.  Hematological:  Does not bruise/bleed easily.  All other systems reviewed and are negative.     Objective:   Physical Exam Vitals reviewed.  Constitutional:      Appearance: Normal appearance.  Cardiovascular:     Rate and Rhythm: Tachycardia present. Rhythm irregular.     Heart sounds: Normal heart sounds.  Pulmonary:     Effort: Pulmonary effort is normal.     Breath sounds: Normal breath sounds.  Skin:    General: Skin is warm.  Neurological:     General: No focal deficit present.     Mental Status: She is alert and oriented to person, place, and time.  Psychiatric:        Mood and Affect: Mood normal.        Behavior: Behavior normal.   BP 133/78   Pulse (!) 125   Temp (!) 97.4 F (36.3 C) (Temporal)   Resp 20   Ht 5' 3" (1.6 m)   Wt 158 lb (71.7 kg)   LMP 05/11/1992   SpO2 96%   BMI 27.99 kg/m    Urine clear     Assessment & Plan:   Veronica Paul in today with chief complaint of poor  appetite, Sore Throat, and Nausea   1. Weakness Labs pending Encourage to continue to get out of bed daily Will call with lab results - Urinalysis, Complete - CBC with Differential/Platelet - CMP14+EGFR  2. Nausea Zofran 73m disolable tablets- 1/2 tablet prn Drink at least 1-2 ensures daily Continue to crush pills to get them down  3. Dehydration At least 3 bottle of water a day    The above assessment and management plan was discussed with the patient. The patient verbalized understanding of and has agreed to the management plan. Patient is aware to call the clinic if symptoms persist or worsen. Patient is aware when to return to the clinic for a follow-up visit. Patient educated on when it is appropriate to go to the emergency department.   Mary-Margaret MHassell Done FNP

## 2021-07-18 NOTE — Patient Instructions (Signed)

## 2021-07-19 LAB — CBC WITH DIFFERENTIAL/PLATELET
Basophils Absolute: 0.1 10*3/uL (ref 0.0–0.2)
Basos: 1 %
EOS (ABSOLUTE): 0.2 10*3/uL (ref 0.0–0.4)
Eos: 2 %
Hematocrit: 42.4 % (ref 34.0–46.6)
Hemoglobin: 13.8 g/dL (ref 11.1–15.9)
Immature Grans (Abs): 0 10*3/uL (ref 0.0–0.1)
Immature Granulocytes: 0 %
Lymphocytes Absolute: 1.2 10*3/uL (ref 0.7–3.1)
Lymphs: 11 %
MCH: 27.5 pg (ref 26.6–33.0)
MCHC: 32.5 g/dL (ref 31.5–35.7)
MCV: 85 fL (ref 79–97)
Monocytes Absolute: 1.1 10*3/uL — ABNORMAL HIGH (ref 0.1–0.9)
Monocytes: 9 %
Neutrophils Absolute: 8.8 10*3/uL — ABNORMAL HIGH (ref 1.4–7.0)
Neutrophils: 77 %
Platelets: 305 10*3/uL (ref 150–450)
RBC: 5.01 x10E6/uL (ref 3.77–5.28)
RDW: 13.2 % (ref 11.7–15.4)
WBC: 11.4 10*3/uL — ABNORMAL HIGH (ref 3.4–10.8)

## 2021-07-19 LAB — CMP14+EGFR
ALT: 26 IU/L (ref 0–32)
AST: 31 IU/L (ref 0–40)
Albumin/Globulin Ratio: 1.2 (ref 1.2–2.2)
Albumin: 3.7 g/dL (ref 3.6–4.6)
Alkaline Phosphatase: 110 IU/L (ref 44–121)
BUN/Creatinine Ratio: 10 — ABNORMAL LOW (ref 12–28)
BUN: 9 mg/dL (ref 8–27)
Bilirubin Total: 0.7 mg/dL (ref 0.0–1.2)
CO2: 23 mmol/L (ref 20–29)
Calcium: 9.1 mg/dL (ref 8.7–10.3)
Chloride: 97 mmol/L (ref 96–106)
Creatinine, Ser: 0.9 mg/dL (ref 0.57–1.00)
Globulin, Total: 3.1 g/dL (ref 1.5–4.5)
Glucose: 121 mg/dL — ABNORMAL HIGH (ref 70–99)
Potassium: 3.6 mmol/L (ref 3.5–5.2)
Sodium: 138 mmol/L (ref 134–144)
Total Protein: 6.8 g/dL (ref 6.0–8.5)
eGFR: 63 mL/min/{1.73_m2} (ref >59)

## 2021-08-07 ENCOUNTER — Emergency Department (HOSPITAL_COMMUNITY): Payer: Medicare HMO

## 2021-08-07 ENCOUNTER — Encounter (HOSPITAL_COMMUNITY): Payer: Self-pay

## 2021-08-07 ENCOUNTER — Other Ambulatory Visit: Payer: Self-pay

## 2021-08-07 ENCOUNTER — Inpatient Hospital Stay (HOSPITAL_COMMUNITY)
Admission: EM | Admit: 2021-08-07 | Discharge: 2021-08-12 | DRG: 177 | Disposition: A | Discharge status: Skilled Nursing Facility | Payer: Medicare HMO | Attending: Family Medicine | Admitting: Family Medicine

## 2021-08-07 DIAGNOSIS — Z801 Family history of malignant neoplasm of trachea, bronchus and lung: Secondary | ICD-10-CM

## 2021-08-07 DIAGNOSIS — A419 Sepsis, unspecified organism: Principal | ICD-10-CM

## 2021-08-07 DIAGNOSIS — I482 Chronic atrial fibrillation, unspecified: Secondary | ICD-10-CM | POA: Diagnosis present

## 2021-08-07 DIAGNOSIS — I10 Essential (primary) hypertension: Secondary | ICD-10-CM | POA: Diagnosis present

## 2021-08-07 DIAGNOSIS — Z8616 Personal history of COVID-19: Secondary | ICD-10-CM

## 2021-08-07 DIAGNOSIS — R059 Cough, unspecified: Secondary | ICD-10-CM | POA: Diagnosis not present

## 2021-08-07 DIAGNOSIS — R0602 Shortness of breath: Secondary | ICD-10-CM | POA: Diagnosis not present

## 2021-08-07 DIAGNOSIS — Z881 Allergy status to other antibiotic agents status: Secondary | ICD-10-CM

## 2021-08-07 DIAGNOSIS — E559 Vitamin D deficiency, unspecified: Secondary | ICD-10-CM | POA: Diagnosis present

## 2021-08-07 DIAGNOSIS — E872 Acidosis, unspecified: Secondary | ICD-10-CM | POA: Diagnosis present

## 2021-08-07 DIAGNOSIS — K449 Diaphragmatic hernia without obstruction or gangrene: Secondary | ICD-10-CM | POA: Diagnosis present

## 2021-08-07 DIAGNOSIS — J189 Pneumonia, unspecified organism: Secondary | ICD-10-CM

## 2021-08-07 DIAGNOSIS — J690 Pneumonitis due to inhalation of food and vomit: Principal | ICD-10-CM | POA: Diagnosis present

## 2021-08-07 DIAGNOSIS — I160 Hypertensive urgency: Secondary | ICD-10-CM | POA: Diagnosis present

## 2021-08-07 DIAGNOSIS — Z9851 Tubal ligation status: Secondary | ICD-10-CM

## 2021-08-07 DIAGNOSIS — E785 Hyperlipidemia, unspecified: Secondary | ICD-10-CM | POA: Diagnosis present

## 2021-08-07 DIAGNOSIS — E119 Type 2 diabetes mellitus without complications: Secondary | ICD-10-CM | POA: Diagnosis present

## 2021-08-07 DIAGNOSIS — J90 Pleural effusion, not elsewhere classified: Secondary | ICD-10-CM | POA: Diagnosis not present

## 2021-08-07 DIAGNOSIS — J9601 Acute respiratory failure with hypoxia: Secondary | ICD-10-CM | POA: Diagnosis not present

## 2021-08-07 DIAGNOSIS — Z6828 Body mass index (BMI) 28.0-28.9, adult: Secondary | ICD-10-CM

## 2021-08-07 DIAGNOSIS — Z20822 Contact with and (suspected) exposure to covid-19: Secondary | ICD-10-CM | POA: Diagnosis present

## 2021-08-07 DIAGNOSIS — Z887 Allergy status to serum and vaccine status: Secondary | ICD-10-CM

## 2021-08-07 DIAGNOSIS — F32A Depression, unspecified: Secondary | ICD-10-CM | POA: Diagnosis present

## 2021-08-07 DIAGNOSIS — Z902 Acquired absence of lung [part of]: Secondary | ICD-10-CM

## 2021-08-07 DIAGNOSIS — E039 Hypothyroidism, unspecified: Secondary | ICD-10-CM | POA: Diagnosis present

## 2021-08-07 DIAGNOSIS — I4891 Unspecified atrial fibrillation: Secondary | ICD-10-CM | POA: Diagnosis present

## 2021-08-07 DIAGNOSIS — E663 Overweight: Secondary | ICD-10-CM | POA: Diagnosis present

## 2021-08-07 DIAGNOSIS — F411 Generalized anxiety disorder: Secondary | ICD-10-CM | POA: Diagnosis present

## 2021-08-07 DIAGNOSIS — I959 Hypotension, unspecified: Secondary | ICD-10-CM | POA: Diagnosis present

## 2021-08-07 DIAGNOSIS — Z888 Allergy status to other drugs, medicaments and biological substances status: Secondary | ICD-10-CM

## 2021-08-07 DIAGNOSIS — Z85118 Personal history of other malignant neoplasm of bronchus and lung: Secondary | ICD-10-CM

## 2021-08-07 DIAGNOSIS — E890 Postprocedural hypothyroidism: Secondary | ICD-10-CM | POA: Diagnosis present

## 2021-08-07 DIAGNOSIS — Z79899 Other long term (current) drug therapy: Secondary | ICD-10-CM

## 2021-08-07 DIAGNOSIS — Z8249 Family history of ischemic heart disease and other diseases of the circulatory system: Secondary | ICD-10-CM

## 2021-08-07 DIAGNOSIS — Z794 Long term (current) use of insulin: Secondary | ICD-10-CM

## 2021-08-07 DIAGNOSIS — J168 Pneumonia due to other specified infectious organisms: Secondary | ICD-10-CM | POA: Diagnosis not present

## 2021-08-07 DIAGNOSIS — K5900 Constipation, unspecified: Secondary | ICD-10-CM | POA: Diagnosis present

## 2021-08-07 DIAGNOSIS — R1310 Dysphagia, unspecified: Secondary | ICD-10-CM | POA: Diagnosis present

## 2021-08-07 DIAGNOSIS — Z882 Allergy status to sulfonamides status: Secondary | ICD-10-CM

## 2021-08-07 DIAGNOSIS — K219 Gastro-esophageal reflux disease without esophagitis: Secondary | ICD-10-CM | POA: Diagnosis present

## 2021-08-07 DIAGNOSIS — Z7901 Long term (current) use of anticoagulants: Secondary | ICD-10-CM

## 2021-08-07 DIAGNOSIS — Z79890 Hormone replacement therapy (postmenopausal): Secondary | ICD-10-CM

## 2021-08-07 LAB — GLUCOSE, CAPILLARY
Glucose-Capillary: 138 mg/dL — ABNORMAL HIGH (ref 70–99)
Glucose-Capillary: 142 mg/dL — ABNORMAL HIGH (ref 70–99)

## 2021-08-07 LAB — CBC
HCT: 51.3 % — ABNORMAL HIGH (ref 36.0–46.0)
Hemoglobin: 15.9 g/dL — ABNORMAL HIGH (ref 12.0–15.0)
MCH: 27.6 pg (ref 26.0–34.0)
MCHC: 31 g/dL (ref 30.0–36.0)
MCV: 89.1 fL (ref 80.0–100.0)
Platelets: 382 10*3/uL (ref 150–400)
RBC: 5.76 MIL/uL — ABNORMAL HIGH (ref 3.87–5.11)
RDW: 15.7 % — ABNORMAL HIGH (ref 11.5–15.5)
WBC: 13.7 10*3/uL — ABNORMAL HIGH (ref 4.0–10.5)
nRBC: 0 % (ref 0.0–0.2)

## 2021-08-07 LAB — TSH: TSH: 4.073 u[IU]/mL (ref 0.350–4.500)

## 2021-08-07 LAB — COMPREHENSIVE METABOLIC PANEL
ALT: 31 U/L (ref 0–44)
AST: 48 U/L — ABNORMAL HIGH (ref 15–41)
Albumin: 4.5 g/dL (ref 3.5–5.0)
Alkaline Phosphatase: 103 U/L (ref 38–126)
Anion gap: 12 (ref 5–15)
BUN: 14 mg/dL (ref 8–23)
CO2: 27 mmol/L (ref 22–32)
Calcium: 9.5 mg/dL (ref 8.9–10.3)
Chloride: 102 mmol/L (ref 98–111)
Creatinine, Ser: 0.98 mg/dL (ref 0.44–1.00)
GFR, Estimated: 57 mL/min — ABNORMAL LOW (ref >60)
Glucose, Bld: 165 mg/dL — ABNORMAL HIGH (ref 70–99)
Potassium: 4 mmol/L (ref 3.5–5.1)
Sodium: 141 mmol/L (ref 135–145)
Total Bilirubin: 0.6 mg/dL (ref 0.3–1.2)
Total Protein: 9.1 g/dL — ABNORMAL HIGH (ref 6.5–8.1)

## 2021-08-07 LAB — APTT: aPTT: 34 seconds (ref 24–36)

## 2021-08-07 LAB — TROPONIN I (HIGH SENSITIVITY): Troponin I (High Sensitivity): 6 ng/L (ref <18)

## 2021-08-07 LAB — I-STAT CHEM 8, ED
BUN: 14 mg/dL (ref 8–23)
Calcium, Ion: 1.16 mmol/L (ref 1.15–1.40)
Chloride: 102 mmol/L (ref 98–111)
Creatinine, Ser: 1 mg/dL (ref 0.44–1.00)
Glucose, Bld: 163 mg/dL — ABNORMAL HIGH (ref 70–99)
HCT: 54 % — ABNORMAL HIGH (ref 36.0–46.0)
Hemoglobin: 18.4 g/dL — ABNORMAL HIGH (ref 12.0–15.0)
Potassium: 4.3 mmol/L (ref 3.5–5.1)
Sodium: 142 mmol/L (ref 135–145)
TCO2: 28 mmol/L (ref 22–32)

## 2021-08-07 LAB — BLOOD GAS, ARTERIAL
Acid-base deficit: 1.2 mmol/L (ref 0.0–2.0)
Bicarbonate: 27.1 mmol/L (ref 20.0–28.0)
O2 Saturation: 100 %
Patient temperature: 36.2
pCO2 arterial: 57 mmHg — ABNORMAL HIGH (ref 32–48)
pH, Arterial: 7.28 — ABNORMAL LOW (ref 7.35–7.45)
pO2, Arterial: 183 mmHg — ABNORMAL HIGH (ref 83–108)

## 2021-08-07 LAB — RESP PANEL BY RT-PCR (FLU A&B, COVID) ARPGX2
Influenza A by PCR: NEGATIVE
Influenza B by PCR: NEGATIVE
SARS Coronavirus 2 by RT PCR: NEGATIVE

## 2021-08-07 LAB — PROTIME-INR
INR: 1.2 (ref 0.8–1.2)
Prothrombin Time: 15.4 s — ABNORMAL HIGH (ref 11.4–15.2)

## 2021-08-07 LAB — LACTIC ACID, PLASMA
Lactic Acid, Venous: 3 mmol/L (ref 0.5–1.9)
Lactic Acid, Venous: 2.1 mmol/L (ref 0.5–1.9)

## 2021-08-07 LAB — BRAIN NATRIURETIC PEPTIDE: B Natriuretic Peptide: 677 pg/mL — ABNORMAL HIGH (ref 0.0–100.0)

## 2021-08-07 MED ORDER — POLYETHYLENE GLYCOL 3350 17 G PO PACK: 17.0000 g | PACK | Freq: Every day | ORAL | Status: DC | PRN

## 2021-08-07 MED ORDER — DILTIAZEM HCL 60 MG PO TABS
90.0000 mg | ORAL_TABLET | Freq: Three times a day (TID) | ORAL | Status: DC
  Administered 2021-08-07 – 2021-08-12 (×13): 90 mg via ORAL
  Filled 2021-08-07 (×5): qty 1
  Filled 2021-08-07: qty 3
  Filled 2021-08-07 (×2): qty 1
  Filled 2021-08-07: qty 3
  Filled 2021-08-07 (×2): qty 1
  Filled 2021-08-07: qty 3
  Filled 2021-08-07: qty 1

## 2021-08-07 MED ORDER — ONDANSETRON 4 MG PO TBDP: 4.0000 mg | ORAL_TABLET | Freq: Three times a day (TID) | ORAL | Status: DC | PRN

## 2021-08-07 MED ORDER — SODIUM CHLORIDE 0.9 % IV SOLN: 250.0000 mL | INTRAVENOUS | Status: DC | PRN

## 2021-08-07 MED ORDER — LEVOTHYROXINE SODIUM 88 MCG PO TABS
88.0000 ug | ORAL_TABLET | Freq: Every day | ORAL | Status: DC
  Administered 2021-08-08 – 2021-08-12 (×5): 88 ug via ORAL
  Filled 2021-08-07 (×5): qty 1

## 2021-08-07 MED ORDER — FUROSEMIDE 20 MG PO TABS
20.0000 mg | ORAL_TABLET | Freq: Every day | ORAL | Status: DC
  Administered 2021-08-08 – 2021-08-12 (×5): 20 mg via ORAL
  Filled 2021-08-07 (×6): qty 1

## 2021-08-07 MED ORDER — SODIUM CHLORIDE 0.9% FLUSH: 3.0000 mL | INTRAVENOUS | Status: DC | PRN

## 2021-08-07 MED ORDER — SODIUM CHLORIDE 0.9 % IV SOLN
1.0000 g | Freq: Once | INTRAVENOUS | Status: AC
  Administered 2021-08-07: 1 g via INTRAVENOUS
  Filled 2021-08-07: qty 10

## 2021-08-07 MED ORDER — ACETAMINOPHEN 325 MG PO TABS
650.0000 mg | ORAL_TABLET | Freq: Four times a day (QID) | ORAL | Status: DC | PRN
  Administered 2021-08-08 – 2021-08-10 (×3): 650 mg via ORAL
  Filled 2021-08-07 (×3): qty 2

## 2021-08-07 MED ORDER — ALBUTEROL SULFATE (2.5 MG/3ML) 0.083% IN NEBU
2.5000 mL | INHALATION_SOLUTION | Freq: Once | RESPIRATORY_TRACT | Status: AC
Start: 2021-08-07 — End: 2021-08-07
  Administered 2021-08-07: 2.5 mL via RESPIRATORY_TRACT
  Filled 2021-08-07: qty 3

## 2021-08-07 MED ORDER — METOPROLOL TARTRATE 50 MG PO TABS
150.0000 mg | ORAL_TABLET | Freq: Two times a day (BID) | ORAL | Status: DC
  Administered 2021-08-07 – 2021-08-12 (×10): 150 mg via ORAL
  Filled 2021-08-07 (×11): qty 3

## 2021-08-07 MED ORDER — DILTIAZEM HCL-DEXTROSE 125-5 MG/125ML-% IV SOLN (PREMIX)
5.0000 mg/h | INTRAVENOUS | Status: DC
  Administered 2021-08-07: 5 mg/h via INTRAVENOUS
  Filled 2021-08-07: qty 125

## 2021-08-07 MED ORDER — DOXYCYCLINE HYCLATE 100 MG PO TABS
100.0000 mg | ORAL_TABLET | Freq: Once | ORAL | Status: AC
  Administered 2021-08-07: 100 mg via ORAL
  Filled 2021-08-07: qty 1

## 2021-08-07 MED ORDER — SODIUM CHLORIDE 0.9% FLUSH
3.0000 mL | Freq: Two times a day (BID) | INTRAVENOUS | Status: DC
  Administered 2021-08-07 – 2021-08-12 (×11): 3 mL via INTRAVENOUS

## 2021-08-07 MED ORDER — ALUM & MAG HYDROXIDE-SIMETH 200-200-20 MG/5ML PO SUSP
30.0000 mL | ORAL | Status: DC | PRN
  Administered 2021-08-07: 30 mL via ORAL
  Filled 2021-08-07: qty 30

## 2021-08-07 MED ORDER — APIXABAN 5 MG PO TABS
5.0000 mg | ORAL_TABLET | Freq: Two times a day (BID) | ORAL | Status: DC
  Administered 2021-08-07 – 2021-08-09 (×4): 5 mg via ORAL
  Filled 2021-08-07 (×4): qty 1

## 2021-08-07 MED ORDER — BISACODYL 5 MG PO TBEC
5.0000 mg | DELAYED_RELEASE_TABLET | Freq: Every day | ORAL | Status: DC | PRN
Start: 2021-08-07 — End: 2021-08-12

## 2021-08-07 MED ORDER — ENSURE ENLIVE PO LIQD
237.0000 mL | Freq: Three times a day (TID) | ORAL | Status: DC
Start: 2021-08-07 — End: 2021-08-12
  Administered 2021-08-07 – 2021-08-12 (×14): 237 mL via ORAL
  Filled 2021-08-07 (×3): qty 237

## 2021-08-07 MED ORDER — PANTOPRAZOLE SODIUM 40 MG PO TBEC
40.0000 mg | DELAYED_RELEASE_TABLET | Freq: Every day | ORAL | Status: DC
  Administered 2021-08-08 – 2021-08-12 (×5): 40 mg via ORAL
  Filled 2021-08-07 (×6): qty 1

## 2021-08-07 MED ORDER — INSULIN ASPART 100 UNIT/ML IJ SOLN: 0.0000 [IU] | Freq: Every day | INTRAMUSCULAR | Status: DC

## 2021-08-07 MED ORDER — ONDANSETRON HCL 4 MG PO TABS: 4.0000 mg | ORAL_TABLET | Freq: Four times a day (QID) | ORAL | Status: DC | PRN

## 2021-08-07 MED ORDER — SERTRALINE HCL 50 MG PO TABS
50.0000 mg | ORAL_TABLET | Freq: Every day | ORAL | Status: DC
  Administered 2021-08-08 – 2021-08-12 (×5): 50 mg via ORAL
  Filled 2021-08-07 (×6): qty 1

## 2021-08-07 MED ORDER — LACTATED RINGERS IV SOLN: INTRAVENOUS | Status: AC

## 2021-08-07 MED ORDER — PIPERACILLIN-TAZOBACTAM 3.375 G IVPB
3.3750 g | Freq: Three times a day (TID) | INTRAVENOUS | Status: DC
  Administered 2021-08-08 (×2): 3.375 g via INTRAVENOUS
  Filled 2021-08-07 (×2): qty 50

## 2021-08-07 MED ORDER — LEVOTHYROXINE SODIUM 88 MCG PO TABS: 88.0000 ug | ORAL_TABLET | Freq: Every day | ORAL | Status: DC

## 2021-08-07 MED ORDER — NITROGLYCERIN IN D5W 200-5 MCG/ML-% IV SOLN
5.0000 ug/min | INTRAVENOUS | Status: DC
  Administered 2021-08-07: 10 ug/min via INTRAVENOUS
  Filled 2021-08-07: qty 250

## 2021-08-07 MED ORDER — LACTATED RINGERS IV SOLN: INTRAVENOUS | Status: DC

## 2021-08-07 MED ORDER — NITROGLYCERIN 0.4 MG SL SUBL: 0.4000 mg | SUBLINGUAL_TABLET | SUBLINGUAL | Status: DC | PRN

## 2021-08-07 MED ORDER — DILTIAZEM LOAD VIA INFUSION
10.0000 mg | Freq: Once | INTRAVENOUS | Status: AC
  Administered 2021-08-07: 10 mg via INTRAVENOUS
  Filled 2021-08-07: qty 10

## 2021-08-07 MED ORDER — ONDANSETRON HCL 4 MG/2ML IJ SOLN: 4.0000 mg | Freq: Four times a day (QID) | INTRAMUSCULAR | Status: DC | PRN

## 2021-08-07 MED ORDER — PIPERACILLIN-TAZOBACTAM 3.375 G IVPB 30 MIN
3.3750 g | Freq: Once | INTRAVENOUS | Status: AC
  Administered 2021-08-07: 3.375 g via INTRAVENOUS
  Filled 2021-08-07: qty 50

## 2021-08-07 MED ORDER — INSULIN ASPART 100 UNIT/ML IJ SOLN
0.0000 [IU] | Freq: Three times a day (TID) | INTRAMUSCULAR | Status: DC
  Administered 2021-08-07 – 2021-08-12 (×5): 1 [IU] via SUBCUTANEOUS

## 2021-08-07 MED ORDER — ACETAMINOPHEN 650 MG RE SUPP: 650.0000 mg | Freq: Four times a day (QID) | RECTAL | Status: DC | PRN

## 2021-08-07 NOTE — Progress Notes (Signed)
Pharmacy Antibiotic Note  Veronica Paul is a 86 y.o. female admitted on 08/07/2021 with  aspiration pneumonia .  Pharmacy has been consulted for Zosyn dosing.  Plan: Zosyn 3.375g IV q8h (4 hour infusion). Monitor labs, c/s, and patient improvement.  Height: 5\' 3"  (160 cm) Weight: 72.1 kg (159 lb) IBW/kg (Calculated) : 52.4  Temp (24hrs), Avg:97.2 F (36.2 C), Min:97.2 F (36.2 C), Max:97.2 F (36.2 C)  Recent Labs  Lab 08/07/21 1435 08/07/21 1440  WBC  --  13.7*  CREATININE 1.00 0.98    Estimated Creatinine Clearance: 41.4 mL/min (by C-G formula based on SCr of 0.98 mg/dL).    Allergies  Allergen Reactions   Ace Inhibitors Cough   Feldene [Piroxicam]     REACTION: RASH TO HANDS   Lexapro [Escitalopram]    Meloxicam Nausea And Vomiting   Prevnar [Pneumococcal 13-Val Conj Vacc]    Statins Other (See Comments)    myalgia   Sulfa Antibiotics     Rash    Zithromax [Azithromycin Dihydrate] Itching    Antimicrobials this admission: Zosyn 6/21 >>  Microbiology results: 6/21 BCx: pending   Thank you for allowing pharmacy to be a part of this patient's care.  Margot Ables, PharmD Clinical Pharmacist 08/07/2021 4:16 PM

## 2021-08-07 NOTE — Progress Notes (Signed)
Dietary notified with request for dys 3 dinner tray

## 2021-08-07 NOTE — H&P (Addendum)
History and Physical    Veronica Paul BCW:[id number] DOB: March 27, 1935 DOA: 08/07/2021  PCP: Chevis Pretty, FNP   Patient coming from: Home  Chief Complaint: Shortness of breath/hypoxemia  HPI: Veronica Paul is a 86 y.o. female with medical history significant for atrial fibrillation on Eliquis for anticoagulation, hypertension, anxiety/depression, insulin-dependent diabetes, hypertension, hypothyroidism, GERD, and prior lung cancer with partial lung resection who was brought to the ED after she was noted to be significantly short of breath with tachycardia, hypotension, and hypoxemia around lunchtime while she was eating a tomato sandwich.  She had significant respiratory distress upon arrival to the ED.  She has been compliant with her usual home medications, but did miss her dose of Eliquis yesterday.  There were no other symptoms that were noted prior to this event. According to the daughter, patient has been having consistent issues with swallowing, especially medications.   ED Course: In the ED, patient was in significant respiratory distress and was noted to be hypoxemic with saturations in the 60th percentile.  She was additionally cyanotic.  She was placed on BiPAP and ABGs demonstrated some PCO2 elevation.  She was noted to be quite hypertensive and was started on nitroglycerin drip and was also noted to have atrial fibrillation with RVR for which she was started on Cardizem drip.  Nitroglycerin drip has now been weaned off with adequate blood pressure control and heart rates have improved as well.  Right lower lobe infiltrate was noted on chest x-ray with suspicion of pneumonia.  Leukocytosis of 13,700 also noted.  She was quickly weaned to nasal cannula oxygen.  Review of Systems: Reviewed as noted above, otherwise negative.  Past Medical History:  Diagnosis Date   Allergy    "my nose runs alot"   Anxiety    Arthritis    Atrial fibrillation ([place])    Blood transfusion  without reported diagnosis    "when I had a miscarrage in 1957"   Cataract    bilateral removed   Cholelithiasis    Constipation    x ray showed increased stool in colon- uses Miralax daily and Dulcolax twice a week    Depression    Diabetes mellitus    Dyslipidemia    HTN (hypertension)    Hx of long-term (current) use of anticoagulants    Hyperlipidemia    Hypertension    Hypothyroidism    Lung cancer ([place])    Obesity    Osteopenia    Vitamin D deficiency     Past Surgical History:  Procedure Laterality Date   CATARACT EXTRACTION Bilateral    COLONOSCOPY     COLONOSCOPY WITH PROPOFOL N/A 05/06/2019   Procedure: COLONOSCOPY WITH PROPOFOL;  Surgeon: Jerene Bears, MD;  Location: [facility];  Service: Gastroenterology;  Laterality: N/A;   LUNG REMOVAL, PARTIAL  1998   Right   POLYPECTOMY  05/06/2019   Procedure: POLYPECTOMY;  Surgeon: Jerene Bears, MD;  Location: [facility];  Service: Gastroenterology;;   THYROIDECTOMY     TUBAL LIGATION     UPPER GASTROINTESTINAL ENDOSCOPY       reports that she has never smoked. She has never used smokeless tobacco. She reports that she does not drink alcohol and does not use drugs.  Allergies  Allergen Reactions   Ace Inhibitors Cough   Feldene [Piroxicam]     REACTION: RASH TO HANDS   Lexapro [Escitalopram]    Meloxicam Nausea And Vomiting   Prevnar [Pneumococcal 13-Val Conj Vacc]  Statins Other (See Comments)    myalgia   Sulfa Antibiotics     Rash    Zithromax [Azithromycin Dihydrate] Itching    Family History  Problem Relation Age of Onset   Coronary artery disease Brother 51   Hypertension Brother    Heart disease Brother    Hypertension Mother    Hypertension Father    Heart disease Father    Hypertension Sister    Heart disease Sister    Cancer Sister    Arthritis Daughter    COPD Daughter    Fibromyalgia Daughter    Ulcerative colitis Son    Stroke Maternal Grandmother    Cancer Brother    Lung  cancer Brother    Hypertension Sister    Cancer Sister    Colon cancer Neg Hx    Food intolerance Neg Hx    Esophageal cancer Neg Hx    Rectal cancer Neg Hx    Stomach cancer Neg Hx    Colon polyps Neg Hx     Prior to Admission medications   Medication Sig Start Date End Date Taking? Authorizing Provider  acetaminophen (TYLENOL) 500 MG tablet Take 500 mg by mouth every 6 (six) hours as needed for moderate pain, headache or fever.    [provider]  bisacodyl (DULCOLAX) 5 MG EC tablet Take 5 mg by mouth daily as needed for moderate constipation (usually twice per week).    [provider]  Blood Glucose Monitoring Suppl [facility] VERIO) w/Device KIT Check blood sugars daily Dx E11.9 08/24/18   Hassell Done, Mary-Margaret, FNP  diltiazem (CARDIZEM) 90 MG tablet Take 1 tablet (90 mg total) by mouth 3 (three) times daily. 06/24/21   Hassell Done Mary-Margaret, FNP  ELIQUIS 5 MG TABS tablet TAKE 1 TABLET BY MOUTH TWICE A DAY 06/10/21   Hassell Done, Mary-Margaret, FNP  feeding supplement (ENSURE ENLIVE / ENSURE PLUS) LIQD Take 237 mLs by mouth 3 (three) times daily between meals. 02/27/21   Atway, Rayann N, DO  furosemide (LASIX) 20 MG tablet Take 1 tablet (20 mg total) by mouth daily. 06/24/21   Chevis Pretty, FNP  Lancet Devices [facility] DELICA PLUS LANCING) MISC 1 Device by Does not apply route daily. 03/01/20   Chevis Pretty, FNP  Lancets [facility] DELICA PLUS [id number]) MISC USE TO CHECK BLOOD SUGARS DAILY DX E11.9 11/26/20   Hassell Done Mary-Margaret, FNP  levothyroxine (SYNTHROID) 88 MCG tablet Take 1 tablet (88 mcg total) by mouth daily. 06/24/21   Hassell Done, Mary-Margaret, FNP  Melatonin 10 MG CAPS Take 1 capsule by mouth at bedtime as needed. 07/03/21   Hassell Done Mary-Margaret, FNP  metoprolol tartrate (LOPRESSOR) 100 MG tablet Take 1.5 tablets (150 mg total) by mouth 2 (two) times daily. 06/24/21 10/22/21  Hassell Done, Mary-Margaret, FNP  nitroGLYCERIN (NITROSTAT) 0.4 MG SL tablet Place 1 tablet  (0.4 mg total) under the tongue every 5 (five) minutes as needed for chest pain. 11/21/20   Hassell Done Mary-Margaret, FNP  nystatin (MYCOSTATIN/NYSTOP) powder Apply 1 application. topically 3 (three) times daily. 04/25/21   Hassell Done, Mary-Margaret, FNP  omeprazole (PRILOSEC) 20 MG capsule Take 1 capsule (20 mg total) by mouth daily. 06/24/21   Hassell Done, Mary-Margaret, FNP  ondansetron (ZOFRAN-ODT) 4 MG disintegrating tablet Take 1 tablet (4 mg total) by mouth every 8 (eight) hours as needed for nausea or vomiting. 07/18/21   Chevis Pretty, FNP  [facility] VERIO test strip CHECK BLOOD SUGARS DAILY DX E11.9 09/19/20   Hassell Done, Mary-Margaret, FNP  polyethylene glycol (MIRALAX / GLYCOLAX) 17 g  packet Take 17 g by mouth daily as needed for mild constipation.    [provider]  sertraline (ZOLOFT) 50 MG tablet Take 1 tablet (50 mg total) by mouth daily. 06/24/21   Hassell Done Mary-Margaret, FNP  triamcinolone cream (KENALOG) 0.1 % Apply topically. 04/29/21   [provider]    Physical Exam: Vitals:   08/07/21 1500 08/07/21 1510 08/07/21 1520 08/07/21 1553  BP: (!) 130/97 138/86 123/78 114/74  Pulse: (!) 101  84 64  Resp: (!) 27 (!) 31 (!) 30 (!) 29  Temp:      TempSrc:      SpO2: 100%  95% 97%  Weight:      Height:        Constitutional: NAD, calm, comfortable Vitals:   08/07/21 1500 08/07/21 1510 08/07/21 1520 08/07/21 1553  BP: (!) 130/97 138/86 123/78 114/74  Pulse: (!) 101  84 64  Resp: (!) 27 (!) 31 (!) 30 (!) 29  Temp:      TempSrc:      SpO2: 100%  95% 97%  Weight:      Height:       Eyes: lids and conjunctivae normal Neck: normal, supple Respiratory: clear to auscultation bilaterally. Normal respiratory effort. No accessory muscle use.  Cardiovascular: Regular rate and rhythm, no murmurs. Abdomen: no tenderness, no distention. Bowel sounds positive.  Musculoskeletal:  No edema. Skin: no rashes, lesions, ulcers.  Psychiatric: Flat affect  Labs on Admission: I have  personally reviewed following labs and imaging studies  CBC: Recent Labs  Lab 08/07/21 1435 08/07/21 1440  WBC  --  13.7*  HGB 18.4* 15.9*  HCT 54.0* 51.3*  MCV  --  89.1  PLT  --  300   Basic Metabolic Panel: Recent Labs  Lab 08/07/21 1435 08/07/21 1440  NA 142 141  K 4.3 4.0  CL 102 102  CO2  --  27  GLUCOSE 163* 165*  BUN 14 14  CREATININE 1.00 0.98  CALCIUM  --  9.5   GFR: Estimated Creatinine Clearance: 41.4 mL/min (by C-G formula based on SCr of 0.98 mg/dL). Liver Function Tests: Recent Labs  Lab 08/07/21 1440  AST 48*  ALT 31  ALKPHOS 103  BILITOT 0.6  PROT 9.1*  ALBUMIN 4.5   No results for input(s): "LIPASE", "AMYLASE" in the last 168 hours. No results for input(s): "AMMONIA" in the last 168 hours. Coagulation Profile: No results for input(s): "INR", "PROTIME" in the last 168 hours. Cardiac Enzymes: No results for input(s): "CKTOTAL", "CKMB", "CKMBINDEX", "TROPONINI" in the last 168 hours. BNP (last 3 results) No results for input(s): "PROBNP" in the last 8760 hours. HbA1C: No results for input(s): "HGBA1C" in the last 72 hours. CBG: No results for input(s): "GLUCAP" in the last 168 hours. Lipid Profile: No results for input(s): "CHOL", "HDL", "LDLCALC", "TRIG", "CHOLHDL", "LDLDIRECT" in the last 72 hours. Thyroid Function Tests: No results for input(s): "TSH", "T4TOTAL", "FREET4", "T3FREE", "THYROIDAB" in the last 72 hours. Anemia Panel: No results for input(s): "VITAMINB12", "FOLATE", "FERRITIN", "TIBC", "IRON", "RETICCTPCT" in the last 72 hours. Urine analysis:    Component Value Date/Time   COLORURINE YELLOW 02/23/2021 0519   APPEARANCEUR Clear 07/18/2021 1220   LABSPEC 1.015 02/23/2021 0519   PHURINE 7.5 02/23/2021 0519   GLUCOSEU Negative 07/18/2021 1220   HGBUR NEGATIVE 02/23/2021 0519   BILIRUBINUR Negative 07/18/2021 1220   KETONESUR NEGATIVE 02/23/2021 0519   PROTEINUR 1+ (A) 07/18/2021 [place] 02/23/2021 0519  UROBILINOGEN negative 05/11/2012 1019   NITRITE Negative 07/18/2021 1220   NITRITE NEGATIVE 02/23/2021 0519   LEUKOCYTESUR Negative 07/18/2021 1220   LEUKOCYTESUR SMALL (A) 02/23/2021 0519    Radiological Exams on Admission: DG Chest Port 1 View  Result Date: 08/07/2021 CLINICAL DATA:  Cough and short of breath.  Right lobectomy. EXAM: PORTABLE CHEST 1 VIEW COMPARISON:  Chest 02/22/2021 FINDINGS: New area of infiltrate in the right lung base with small associated right pleural effusion. Prior right lower lobectomy with surgical clips in the right lower hilum. Left lung remains clear.  Negative for heart failure. Right thoracotomy changes with chronic right rib fractures. IMPRESSION: New infiltrate right lower lobe with small right effusion. Probable pneumonia. Postop right lower lobectomy. Electronically Signed   By: Franchot Gallo M.D.   On: 08/07/2021 14:42    EKG: Independently reviewed.  Atrial fibrillation with RVR 136 bpm.  Assessment/Plan Principal Problem:   Acute hypoxemic respiratory failure (HCC) Active Problems:   Hypertension   Hyperlipidemia   Depression   Diabetes mellitus without complication (HCC)   GAD (generalized anxiety disorder)   Hypothyroidism   Atrial fibrillation with RVR (HCC)    Acute hypoxemic respiratory failure possibly related to aspiration pneumonia -SIRS criteria present on admission, but does not appear to be related to sepsis -Change antibiotics to Zosyn for aspiration coverage -Blood cultures ordered and pending -Oxygen saturation improving dramatically -Noted to have chronic dysphagia and will be on Dys 3 diet for now until further eval as noted below  Atrial fibrillation with RVR likely related to above -Currently on Cardizem drip which will be weaned as tolerated -Continue home diltiazem and metoprolol -Monitor on telemetry -Continue anticoagulation with Eliquis  Hypertensive urgency -Required nitroglycerin drip in the ED which has  been weaned off -Resume home blood pressure medications as noted below  History of hypertension -Continue home diltiazem, metoprolol, Lasix -BNP 677  Hypothyroidism -Continue levothyroxine -TSH  Chronic dysphagia -Supposed to be on dysphagia diet -SLP evaluation -Start on Dys 3 diet for now, based on prior recommendations in 1/23  Chronic abdominal pain with constipation -Continue laxation is needed -Last BM yesterday  Mild lactic acidosis -Start gentle IV fluid and recheck in a.m.  Insulin-dependent diabetes -Continue SSI while inpatient  Anxiety/depression -Continue Zoloft  Overweight -BMI 28.17 -Lifestyle changes outpatient   DVT prophylaxis: Eliquis Code Status: Full Family Communication:  Disposition Plan:Admit for tx of PNA and HR control Consults called:None Admission status: Obs, SDU  Severity of Illness: The appropriate patient status for this patient is OBSERVATION. Observation status is judged to be reasonable and necessary in order to provide the required intensity of service to ensure the patient's safety. The patient's presenting symptoms, physical exam findings, and initial radiographic and laboratory data in the context of their medical condition is felt to place them at decreased risk for further clinical deterioration. Furthermore, it is anticipated that the patient will be medically stable for discharge from the hospital within 2 midnights of admission.    Delyle Weider D Manuella Ghazi DO [facility]  If 7PM-7AM, please contact night-coverage [email or web address]  08/07/2021, 4:00 PM

## 2021-08-07 NOTE — ED Triage Notes (Signed)
Patient reports that she developed SOB and tachycardia one hour prior to arrival. In ED sats were 62% on room on arrival to ED.

## 2021-08-07 NOTE — ED Provider Notes (Addendum)
[facility] EMERGENCY DEPARTMENT Provider Note   CSN: [id number] Arrival date & time: 08/07/21  1417     History  Chief Complaint  Patient presents with   Shortness of Breath    Veronica Paul is a 86 y.o. female.   Shortness of Breath   This patient is an 86 year old female, she has a known history of atrial fibrillation which is chronic and persistent on Eliquis 2.5 mg twice daily.  She is also on diltiazem.  She is on metoprolol.  She is accompanied by her daughter who brought her to the emergency department today due to her significant level of illness.  According to the daughter the patient woke up and was feeling okay, around lunchtime she started to appear short of breath and was noted to have tachycardia hypertension and hypoxia.  On arrival the patient's oxygen level is 62%, she is cyanotic, she is very tachypneic with increased work of breathing and tachycardic to 180 bpm in A-fib with rapid ventricular rate.  The patient did miss a dose of Eliquis yesterday but has not missed any other doses in the last month.  She has had her medications this morning according to the daughter.  Pertinent medical history includes a partial lung resection 20 years ago for lung cancer, the patient was not a smoker  Home Medications Prior to Admission medications   Medication Sig Start Date End Date Taking? Authorizing Provider  acetaminophen (TYLENOL) 500 MG tablet Take 500 mg by mouth every 6 (six) hours as needed for moderate pain, headache or fever.    [provider]  bisacodyl (DULCOLAX) 5 MG EC tablet Take 5 mg by mouth daily as needed for moderate constipation (usually twice per week).    [provider]  Blood Glucose Monitoring Suppl [facility] VERIO) w/Device KIT Check blood sugars daily Dx E11.9 08/24/18   Hassell Done, Mary-Margaret, FNP  diltiazem (CARDIZEM) 90 MG tablet Take 1 tablet (90 mg total) by mouth 3 (three) times daily. 06/24/21   Hassell Done Mary-Margaret, FNP   ELIQUIS 5 MG TABS tablet TAKE 1 TABLET BY MOUTH TWICE A DAY 06/10/21   Hassell Done, Mary-Margaret, FNP  feeding supplement (ENSURE ENLIVE / ENSURE PLUS) LIQD Take 237 mLs by mouth 3 (three) times daily between meals. 02/27/21   Atway, Rayann N, DO  furosemide (LASIX) 20 MG tablet Take 1 tablet (20 mg total) by mouth daily. 06/24/21   Chevis Pretty, FNP  Lancet Devices [facility] DELICA PLUS LANCING) MISC 1 Device by Does not apply route daily. 03/01/20   Chevis Pretty, FNP  Lancets [facility] DELICA PLUS [id number]) MISC USE TO CHECK BLOOD SUGARS DAILY DX E11.9 11/26/20   Hassell Done Mary-Margaret, FNP  levothyroxine (SYNTHROID) 88 MCG tablet Take 1 tablet (88 mcg total) by mouth daily. 06/24/21   Hassell Done, Mary-Margaret, FNP  Melatonin 10 MG CAPS Take 1 capsule by mouth at bedtime as needed. 07/03/21   Hassell Done Mary-Margaret, FNP  metoprolol tartrate (LOPRESSOR) 100 MG tablet Take 1.5 tablets (150 mg total) by mouth 2 (two) times daily. 06/24/21 10/22/21  Hassell Done, Mary-Margaret, FNP  nitroGLYCERIN (NITROSTAT) 0.4 MG SL tablet Place 1 tablet (0.4 mg total) under the tongue every 5 (five) minutes as needed for chest pain. 11/21/20   Hassell Done Mary-Margaret, FNP  nystatin (MYCOSTATIN/NYSTOP) powder Apply 1 application. topically 3 (three) times daily. 04/25/21   Hassell Done, Mary-Margaret, FNP  omeprazole (PRILOSEC) 20 MG capsule Take 1 capsule (20 mg total) by mouth daily. 06/24/21   Hassell Done, Mary-Margaret, FNP  ondansetron (ZOFRAN-ODT) 4 MG  disintegrating tablet Take 1 tablet (4 mg total) by mouth every 8 (eight) hours as needed for nausea or vomiting. 07/18/21   Chevis Pretty, FNP  [facility] VERIO test strip CHECK BLOOD SUGARS DAILY DX E11.9 09/19/20   Hassell Done, Mary-Margaret, FNP  polyethylene glycol (MIRALAX / GLYCOLAX) 17 g packet Take 17 g by mouth daily as needed for mild constipation.    [provider]  sertraline (ZOLOFT) 50 MG tablet Take 1 tablet (50 mg total) by mouth daily. 06/24/21   Hassell Done  Mary-Margaret, FNP  triamcinolone cream (KENALOG) 0.1 % Apply topically. 04/29/21   [provider]      Allergies    Ace inhibitors, Feldene [piroxicam], Lexapro [escitalopram], Meloxicam, Prevnar [pneumococcal 13-val conj vacc], Statins, Sulfa antibiotics, and Zithromax [azithromycin dihydrate]    Review of Systems   Review of Systems  Respiratory:  Positive for shortness of breath.   All other systems reviewed and are negative.   Physical Exam Updated Vital Signs BP (!) 139/91   Pulse 94   Temp (!) 97.2 F (36.2 C) (Axillary)   Resp (!) 35   Ht 1.6 m ('5\' 3"' )   Wt 72.1 kg   LMP 05/11/1992   SpO2 100%   BMI 28.17 kg/m  Physical Exam Vitals and nursing note reviewed.  Constitutional:      General: She is in acute distress.     Appearance: She is well-developed. She is ill-appearing.  HENT:     Head: Normocephalic and atraumatic.     Mouth/Throat:     Pharynx: No oropharyngeal exudate.  Eyes:     General: No scleral icterus.       Right eye: No discharge.        Left eye: No discharge.     Conjunctiva/sclera: Conjunctivae normal.     Pupils: Pupils are equal, round, and reactive to light.  Neck:     Thyroid: No thyromegaly.     Vascular: No JVD.  Cardiovascular:     Rate and Rhythm: Tachycardia present. Rhythm irregular.     Heart sounds: Normal heart sounds. No murmur heard.    No friction rub. No gallop.  Pulmonary:     Effort: Respiratory distress present.     Breath sounds: Wheezing, rhonchi and rales present.  Abdominal:     General: Bowel sounds are normal. There is no distension.     Palpations: Abdomen is soft. There is no mass.     Tenderness: There is no abdominal tenderness.  Musculoskeletal:        General: No tenderness. Normal range of motion.     Cervical back: Normal range of motion and neck supple.     Right lower leg: No edema.     Left lower leg: No edema.  Lymphadenopathy:     Cervical: No cervical adenopathy.  Skin:     General: Skin is warm and dry.     Findings: No erythema or rash.  Neurological:     Mental Status: She is alert.     Coordination: Coordination normal.  Psychiatric:        Behavior: Behavior normal.     ED Results / Procedures / Treatments   Labs (all labs ordered are listed, but only abnormal results are displayed) Labs Reviewed  CBC - Abnormal; Notable for the following components:      Result Value   WBC 13.7 (*)    RBC 5.76 (*)    Hemoglobin 15.9 (*)    HCT 51.3 (*)  RDW 15.7 (*)    All other components within normal limits  BLOOD GAS, ARTERIAL - Abnormal; Notable for the following components:   pH, Arterial 7.28 (*)    pCO2 arterial 57 (*)    pO2, Arterial 183 (*)    All other components within normal limits  RESP PANEL BY RT-PCR (FLU A&B, COVID) ARPGX2  CULTURE, BLOOD (ROUTINE X 2)  CULTURE, BLOOD (ROUTINE X 2)  COMPREHENSIVE METABOLIC PANEL  BRAIN NATRIURETIC PEPTIDE  LACTIC ACID, PLASMA  LACTIC ACID, PLASMA  PROTIME-INR  APTT  URINALYSIS, ROUTINE W REFLEX MICROSCOPIC  TROPONIN I (HIGH SENSITIVITY)    EKG None  Radiology DG Chest Port 1 View  Result Date: 08/07/2021 CLINICAL DATA:  Cough and short of breath.  Right lobectomy. EXAM: PORTABLE CHEST 1 VIEW COMPARISON:  Chest 02/22/2021 FINDINGS: New area of infiltrate in the right lung base with small associated right pleural effusion. Prior right lower lobectomy with surgical clips in the right lower hilum. Left lung remains clear.  Negative for heart failure. Right thoracotomy changes with chronic right rib fractures. IMPRESSION: New infiltrate right lower lobe with small right effusion. Probable pneumonia. Postop right lower lobectomy. Electronically Signed   By: Franchot Gallo M.D.   On: 08/07/2021 14:42    Procedures .Critical Care  Performed by: Noemi Chapel, MD Authorized by: Noemi Chapel, MD   Critical care provider statement:    Critical care time (minutes):  30   Critical care time was  exclusive of:  Separately billable procedures and treating other patients and teaching time   Critical care was necessary to treat or prevent imminent or life-threatening deterioration of the following conditions:  Cardiac failure, respiratory failure and sepsis   Critical care was time spent personally by me on the following activities:  Development of treatment plan with patient or surrogate, discussions with consultants, evaluation of patient's response to treatment, examination of patient, ordering and review of laboratory studies, ordering and review of radiographic studies, ordering and performing treatments and interventions, pulse oximetry, re-evaluation of patient's condition, review of old charts and obtaining history from patient or surrogate   I assumed direction of critical care for this patient from another provider in my specialty: no     Care discussed with: admitting provider   Comments:           Medications Ordered in ED Medications  albuterol (PROVENTIL) (2.5 MG/3ML) 0.083% nebulizer solution 2.5 mL (has no administration in time range)  nitroGLYCERIN 50 mg in dextrose 5 % 250 mL (0.2 mg/mL) infusion (0 mcg/min Intravenous Stopped 08/07/21 1458)  diltiazem (CARDIZEM) 1 mg/mL load via infusion 10 mg (10 mg Intravenous Bolus from Bag 08/07/21 1449)    And  diltiazem (CARDIZEM) 125 mg in dextrose 5% 125 mL (1 mg/mL) infusion (5 mg/hr Intravenous New Bag/Given 08/07/21 1448)  cefTRIAXone (ROCEPHIN) 1 g in sodium chloride 0.9 % 100 mL IVPB (1 g Intravenous New Bag/Given 08/07/21 1458)  lactated ringers infusion (has no administration in time range)  doxycycline (VIBRA-TABS) tablet 100 mg (has no administration in time range)    ED Course/ Medical Decision Making/ A&P                           Medical Decision Making Amount and/or Complexity of Data Reviewed Labs: ordered. Radiology: ordered.  Risk Prescription drug management. Decision regarding hospitalization.   This  patient presents to the ED for concern of shortness of breath, this involves an  extensive number of treatment options, and is a complaint that carries with it a high risk of complications and morbidity.  The differential diagnosis includes A-fib with RVR, acute CHF, acute pulmonary edema, severe hypertensive urgency or emergency, acute ischemia, less likely be pulmonary embolism given her anticoagulated state.  Consider sepsis, pneumonia or even pneumothorax   Co morbidities that complicate the patient evaluation  A-fib, advanced age, severe hypertension   Additional history obtained:  Additional history obtained from from the daughter as well as from the medical record External records from outside source obtained and reviewed including medical records, this shows that the patient has been seen recently at the family doctors office as recently as about 1 month ago.  She has not had any admissions to the hospital in quite some time but is followed closely in the office.  She did have COVID-19 in January for which she had a 1 week hospitalization   Lab Tests:  I Ordered, and personally interpreted labs.  The pertinent results include: CBC shows leukocytosis, no significant anemia, in fact the hemoglobin is slightly elevated at 15.9 with normal platelets.  ABG shows an acidosis with a pH of 7.28 and a PCO2 of 57 with a PO2 of 183.  Lactic acid and blood cultures pending   Imaging Studies ordered:  I ordered imaging studies including portable chest x-ray I independently visualized and interpreted imaging which showed right lower lobe infiltrate prior scarring in the right lung I agree with the radiologist interpretation   Cardiac Monitoring: / EKG:  The patient was maintained on a cardiac monitor.  I personally viewed and interpreted the cardiac monitored which showed an underlying rhythm of: Rapid A-fib   Consultations Obtained:  I requested consultation with the hospitalist,  and  discussed lab and imaging findings as well as pertinent plan - they recommend: Admission to high level of care   Problem List / ED Course / Critical interventions / Medication management  The patient has multiple problems going on including severe hypertension initially measured in the 240 range, A-fib with heart rates in the 180s and hypoxia with oxygen in the 60s requiring high flow nonrebreather.  Ultimately over the course of her initial stabilizing care she improved significantly on a nonrebreather and with control of her heart rate on a Cardizem drip she came down into the 100 bpm range with oxygen transitioning down to 5 L by nasal cannula supporting an oxygen level over 95%.  Nitroglycerin drip was used to help control the severe hypertension as well I ordered medication including nitroglycerin, Cardizem, high flow oxygen and antibiotics for what appears to be pneumonia but also rapid A-fib and severe hypertension Reevaluation of the patient after these medicines showed that the patient improved but critical ill I have reviewed the patients home medicines and have made adjustments as needed   Social Determinants of Health:  Critically ill   Test / Admission - Considered:  We will need to be admitted to high level of care   Discussed the case with Dr. Cornelia Copa the hospitalist service who will admit.  I-STAT chemistry reassuring    Final Clinical Impression(s) / ED Diagnoses Final diagnoses:  Sepsis, due to unspecified organism, unspecified whether acute organ dysfunction present ([place])  Pneumonia of right lower lobe due to infectious organism  Severe hypertension  Atrial fibrillation with rapid ventricular response ([place])    Rx / DC Orders ED Discharge Orders     None  Noemi Chapel, MD 08/07/21 [place], MD 08/07/21 [phone]

## 2021-08-07 NOTE — Sepsis Progress Note (Signed)
Pt gently hydrated with LR 150/hr in ED. AF RVR, Hypertensive urgency and hypoxia on arrival.  Lactic acid 3.0 initially. And Lactic acid 2.1 on repeat.

## 2021-08-07 NOTE — Sepsis Progress Note (Addendum)
Code sepsis protocol being monitored by eLink. Rocephin was given prior to code sepsis order/call.

## 2021-08-08 DIAGNOSIS — E119 Type 2 diabetes mellitus without complications: Secondary | ICD-10-CM

## 2021-08-08 DIAGNOSIS — I10 Essential (primary) hypertension: Secondary | ICD-10-CM | POA: Diagnosis not present

## 2021-08-08 DIAGNOSIS — J690 Pneumonitis due to inhalation of food and vomit: Secondary | ICD-10-CM | POA: Diagnosis not present

## 2021-08-08 DIAGNOSIS — I959 Hypotension, unspecified: Secondary | ICD-10-CM | POA: Diagnosis not present

## 2021-08-08 DIAGNOSIS — E039 Hypothyroidism, unspecified: Secondary | ICD-10-CM | POA: Diagnosis not present

## 2021-08-08 DIAGNOSIS — Z79899 Other long term (current) drug therapy: Secondary | ICD-10-CM | POA: Diagnosis not present

## 2021-08-08 DIAGNOSIS — K219 Gastro-esophageal reflux disease without esophagitis: Secondary | ICD-10-CM | POA: Diagnosis not present

## 2021-08-08 DIAGNOSIS — R1310 Dysphagia, unspecified: Secondary | ICD-10-CM | POA: Diagnosis not present

## 2021-08-08 DIAGNOSIS — E782 Mixed hyperlipidemia: Secondary | ICD-10-CM

## 2021-08-08 DIAGNOSIS — K449 Diaphragmatic hernia without obstruction or gangrene: Secondary | ICD-10-CM | POA: Diagnosis not present

## 2021-08-08 DIAGNOSIS — E559 Vitamin D deficiency, unspecified: Secondary | ICD-10-CM | POA: Diagnosis not present

## 2021-08-08 DIAGNOSIS — R69 Illness, unspecified: Secondary | ICD-10-CM | POA: Diagnosis not present

## 2021-08-08 DIAGNOSIS — R933 Abnormal findings on diagnostic imaging of other parts of digestive tract: Secondary | ICD-10-CM | POA: Diagnosis not present

## 2021-08-08 DIAGNOSIS — J9601 Acute respiratory failure with hypoxia: Secondary | ICD-10-CM | POA: Diagnosis not present

## 2021-08-08 DIAGNOSIS — F32A Depression, unspecified: Secondary | ICD-10-CM | POA: Diagnosis not present

## 2021-08-08 DIAGNOSIS — I4891 Unspecified atrial fibrillation: Secondary | ICD-10-CM | POA: Diagnosis not present

## 2021-08-08 DIAGNOSIS — Z9851 Tubal ligation status: Secondary | ICD-10-CM | POA: Diagnosis not present

## 2021-08-08 DIAGNOSIS — Z20822 Contact with and (suspected) exposure to covid-19: Secondary | ICD-10-CM | POA: Diagnosis not present

## 2021-08-08 DIAGNOSIS — Z8616 Personal history of COVID-19: Secondary | ICD-10-CM | POA: Diagnosis not present

## 2021-08-08 DIAGNOSIS — E663 Overweight: Secondary | ICD-10-CM | POA: Diagnosis not present

## 2021-08-08 DIAGNOSIS — K5900 Constipation, unspecified: Secondary | ICD-10-CM | POA: Diagnosis not present

## 2021-08-08 DIAGNOSIS — I482 Chronic atrial fibrillation, unspecified: Secondary | ICD-10-CM | POA: Diagnosis not present

## 2021-08-08 DIAGNOSIS — E872 Acidosis, unspecified: Secondary | ICD-10-CM | POA: Diagnosis not present

## 2021-08-08 DIAGNOSIS — Z794 Long term (current) use of insulin: Secondary | ICD-10-CM | POA: Diagnosis not present

## 2021-08-08 DIAGNOSIS — E785 Hyperlipidemia, unspecified: Secondary | ICD-10-CM | POA: Diagnosis not present

## 2021-08-08 DIAGNOSIS — F411 Generalized anxiety disorder: Secondary | ICD-10-CM | POA: Diagnosis not present

## 2021-08-08 DIAGNOSIS — I160 Hypertensive urgency: Secondary | ICD-10-CM | POA: Diagnosis not present

## 2021-08-08 DIAGNOSIS — Z6828 Body mass index (BMI) 28.0-28.9, adult: Secondary | ICD-10-CM | POA: Diagnosis not present

## 2021-08-08 DIAGNOSIS — E890 Postprocedural hypothyroidism: Secondary | ICD-10-CM | POA: Diagnosis not present

## 2021-08-08 LAB — LACTIC ACID, PLASMA: Lactic Acid, Venous: 1 mmol/L (ref 0.5–1.9)

## 2021-08-08 LAB — BASIC METABOLIC PANEL
Anion gap: 8 (ref 5–15)
BUN: 14 mg/dL (ref 8–23)
CO2: 26 mmol/L (ref 22–32)
Calcium: 8.6 mg/dL — ABNORMAL LOW (ref 8.9–10.3)
Chloride: 107 mmol/L (ref 98–111)
Creatinine, Ser: 0.85 mg/dL (ref 0.44–1.00)
GFR, Estimated: >60 mL/min (ref >60)
Glucose, Bld: 106 mg/dL — ABNORMAL HIGH (ref 70–99)
Potassium: 3.6 mmol/L (ref 3.5–5.1)
Sodium: 141 mmol/L (ref 135–145)

## 2021-08-08 LAB — GLUCOSE, CAPILLARY
Glucose-Capillary: 105 mg/dL — ABNORMAL HIGH (ref 70–99)
Glucose-Capillary: 126 mg/dL — ABNORMAL HIGH (ref 70–99)
Glucose-Capillary: 119 mg/dL — ABNORMAL HIGH (ref 70–99)
Glucose-Capillary: 106 mg/dL — ABNORMAL HIGH (ref 70–99)

## 2021-08-08 LAB — CBC
HCT: 39.2 % (ref 36.0–46.0)
Hemoglobin: 12.1 g/dL (ref 12.0–15.0)
MCH: 27.6 pg (ref 26.0–34.0)
MCHC: 30.9 g/dL (ref 30.0–36.0)
MCV: 89.3 fL (ref 80.0–100.0)
Platelets: 257 10*3/uL (ref 150–400)
RBC: 4.39 MIL/uL (ref 3.87–5.11)
RDW: 15 % (ref 11.5–15.5)
WBC: 9.8 10*3/uL (ref 4.0–10.5)
nRBC: 0 % (ref 0.0–0.2)

## 2021-08-08 LAB — MAGNESIUM: Magnesium: 2 mg/dL (ref 1.7–2.4)

## 2021-08-08 LAB — MRSA NEXT GEN BY PCR, NASAL: MRSA by PCR Next Gen: NOT DETECTED

## 2021-08-08 MED ORDER — MELATONIN 3 MG PO TABS
6.0000 mg | ORAL_TABLET | Freq: Every day | ORAL | Status: DC
  Administered 2021-08-08 – 2021-08-11 (×5): 6 mg via ORAL
  Filled 2021-08-08 (×5): qty 2

## 2021-08-08 MED ORDER — CHLORHEXIDINE GLUCONATE CLOTH 2 % EX PADS
6.0000 | MEDICATED_PAD | Freq: Every day | CUTANEOUS | Status: DC
  Administered 2021-08-08 – 2021-08-12 (×5): 6 via TOPICAL

## 2021-08-08 MED ORDER — SODIUM CHLORIDE 0.9 % IV SOLN
3.0000 g | Freq: Four times a day (QID) | INTRAVENOUS | Status: DC
  Administered 2021-08-08 – 2021-08-09 (×3): 3 g via INTRAVENOUS
  Filled 2021-08-08 (×4): qty 8

## 2021-08-08 NOTE — Hospital Course (Signed)
86 y.o. female with medical history significant for atrial fibrillation on Eliquis for anticoagulation, hypertension, anxiety/depression, insulin-dependent diabetes, hypertension, hypothyroidism, GERD, and prior lung cancer with partial lung resection who was brought to the ED after she was noted to be significantly short of breath with tachycardia, hypotension, and hypoxemia around lunchtime while she was eating a tomato sandwich.  She had significant respiratory distress upon arrival to the ED.  She has been compliant with her usual home medications, but did miss her dose of Eliquis yesterday.  There were no other symptoms that were noted prior to this event. According to the daughter, patient has been having consistent issues with swallowing, especially medications.   ED Course: In the ED, patient was in significant respiratory distress and was noted to be hypoxemic with saturations in the 60th percentile.  She was additionally cyanotic.  She was placed on BiPAP and ABGs demonstrated some PCO2 elevation.  She was noted to be quite hypertensive and was started on nitroglycerin drip and was also noted to have atrial fibrillation with RVR for which she was started on Cardizem drip.  Nitroglycerin drip has now been weaned off with adequate blood pressure control and heart rates have improved as well.  Right lower lobe infiltrate was noted on chest x-ray with suspicion of pneumonia.  Leukocytosis of 13,700 also noted.  She was quickly weaned to nasal cannula oxygen.

## 2021-08-08 NOTE — Progress Notes (Signed)
Patient OOB up with PT, BP increased to 197/97 with c/o of nausea & dizziness, requested PT to hold off on continuing therapy at this time, MD notified

## 2021-08-08 NOTE — TOC Initial Note (Signed)
Transition of Care [facility]) - Initial/Assessment Note    Patient Details  Name: Veronica Paul MRN: [id number] Date of Birth: 01-29-1936  Transition of Care [facility]) CM/SW Contact:    Ihor Gully, LCSW Phone Number: 08/08/2021, 4:15 PM  Clinical Narrative:                 PT recommends SNF. Patient from home alone. Independent at baseline. Adult children prepare her meals and she heats them up. They come to the house when it she showers however patient is independent in bathing, grooming, and dressing. Has RW and bsc in home, does not use currently. Family is agreeable to SNF. Referrals made to facilities of choice.   Expected Discharge Plan: Skilled Nursing Facility Barriers to Discharge: Continued Medical Work up   Patient Goals and CMS Choice Patient states their goals for this hospitalization and ongoing recovery are:: rehab and home      Expected Discharge Plan and Services Expected Discharge Plan: [place]       Living arrangements for the past 2 months: Single Family Home                                      Prior Living Arrangements/Services Living arrangements for the past 2 months: Single Family Home Lives with:: Self Patient language and need for interpreter reviewed:: Yes Do you feel safe going back to the place where you live?: Yes      Need for Family Participation in Patient Care: Yes (Comment) Care giver support system in place?: Yes (comment) Current home services: DME (has walker and bsc) Criminal Activity/Legal Involvement Pertinent to Current Situation/Hospitalization: No - Comment as needed  Activities of Daily Living Home Assistive Devices/Equipment: Shower chair without back ADL Screening (condition at time of admission) Patient's cognitive ability adequate to safely complete daily activities?: No Is the patient deaf or have difficulty hearing?: No Does the patient have difficulty seeing, even when wearing  glasses/contacts?: No Does the patient have difficulty concentrating, remembering, or making decisions?: Yes Patient able to express need for assistance with ADLs?: Yes Does the patient have difficulty dressing or bathing?: Yes Independently performs ADLs?: No Communication: Independent Dressing (OT): Independent Grooming: Independent Feeding: Needs assistance Is this a change from baseline?: Pre-admission baseline Bathing: Needs assistance (Family assists with hair washing and is present in home when patient showers) Is this a change from baseline?: Pre-admission baseline Toileting: Independent In/Out Bed: Independent Walks in Home: Independent Does the patient have difficulty walking or climbing stairs?: No Weakness of Legs: Both Weakness of Arms/Hands: None  Permission Sought/Granted Permission sought to share information with : Family Supports    Share Information with NAME: daughter, Mrs. Wheatley           Emotional Assessment     Affect (typically observed): Appropriate Orientation: : Oriented to Self, Oriented to Place, Oriented to  Time, Oriented to Situation Alcohol / Substance Use: Not Applicable Psych Involvement: No (comment)  Admission diagnosis:  Atrial fibrillation with rapid ventricular response (HCC) [I48.91] Severe hypertension [I10] Acute hypoxemic respiratory failure (HCC) [J96.01] Pneumonia of right lower lobe due to infectious organism [J18.9] Sepsis, due to unspecified organism, unspecified whether acute organ dysfunction present [facility]) [A41.9] Aspiration pneumonia ([place]) [J69.0] Patient Active Problem List   Diagnosis Date Noted   Aspiration pneumonia ([place]) 08/08/2021   Acute hypoxemic respiratory failure ([place]) 08/07/2021   Secondary hypercoagulable state (  [place]) 05/01/2021   Anxiety 03/26/2021   Dysphagia 02/24/2021   COVID-19 virus infection 02/22/2021   Positive serology for Helicobacter pylori 70/26/3785   Irritable bowel syndrome with both  constipation and diarrhea 05/19/2019   Benign neoplasm of cecum    Benign neoplasm of ascending colon    Benign neoplasm of transverse colon    Benign neoplasm of descending colon    Benign neoplasm of sigmoid colon    Permanent atrial fibrillation ([place]) 05/05/2019   Weight loss    Elevated coronary artery calcium score 03/15/2019   Atrial fibrillation with RVR ([place]) 08/17/2018   Hypokalemia 08/17/2018   Hypothyroidism 05/23/2013   Diabetes mellitus without complication ([place]) 88/50/2774   GAD (generalized anxiety disorder) 02/09/2013   Depression 06/25/2012   Peripheral edema 06/25/2012   Osteopenia of lumbar spine    Hypertension    Hyperlipidemia    BMI 34.0-34.9,adult    PCP:  Chevis Pretty, FNP Pharmacy:   [facility], [place] [address] Phone: [phone] Fax: [phone]  [id number] [place], [address] [place] [address] Phone: [phone] Fax: [phone]  [facility] [address] [place] [address] Phone: [phone] Fax: [phone]     Social Determinants of Health (SDOH) Interventions    Readmission Risk Interventions     No data to display

## 2021-08-08 NOTE — Evaluation (Signed)
Occupational Therapy Evaluation Patient Details Name: Veronica Paul MRN: [id number] DOB: 07/10/1935 Today's Date: 08/08/2021   History of Present Illness Veronica Paul is a 86 y.o. female with medical history significant for atrial fibrillation on Eliquis for anticoagulation, hypertension, anxiety/depression, insulin-dependent diabetes, hypertension, hypothyroidism, GERD, and prior lung cancer with partial lung resection who was brought to the ED after she was noted to be significantly short of breath with tachycardia, hypotension, and hypoxemia around lunchtime while she was eating a tomato sandwich.  She had significant respiratory distress upon arrival to the ED.  She has been compliant with her usual home medications, but did miss her dose of Eliquis yesterday.  There were no other symptoms that were noted prior to this event. According to the daughter, patient has been having consistent issues with swallowing, especially medications.   Clinical Impression   Pt agreeable to OT and PT co-evaluation. Pt independent mostly for ADL's at baseline with assist for IADL's. Pt limited by elevated blood pressure today which was initially 223/92 in bed and 193/93 when transferred to chair. Nursing was notified of elevated blood pressure and requested therapy to discontinue physical exercise with pt. Pt demonstrates fair balance seated at EOB and poor in standing. Pt typically able to transfer on her own prior to hospitalization where pt needs min G assist. Pt left in chair with family present. Pt will benefit from continued OT in the hospital and recommended venue below to increase strength, balance, and endurance for safe ADL's.         Recommendations for follow up therapy are one component of a multi-disciplinary discharge planning process, led by the attending physician.  Recommendations may be updated based on patient status, additional functional criteria and insurance authorization.   Follow Up  Recommendations  Skilled nursing-short term rehab (<3 hours/day)    Assistance Recommended at Discharge Intermittent Supervision/Assistance  Patient can return home with the following A little help with walking and/or transfers;A lot of help with bathing/dressing/bathroom;Assistance with cooking/housework;Assist for transportation;Help with stairs or ramp for entrance    Functional Status Assessment  Patient has had a recent decline in their functional status and demonstrates the ability to make significant improvements in function in a reasonable and predictable amount of time.  Equipment Recommendations  None recommended by OT    Recommendations for Other Services       Precautions / Restrictions Precautions Precautions: Fall Restrictions Weight Bearing Restrictions: No      Mobility Bed Mobility Overal bed mobility: Needs Assistance Bed Mobility: Supine to Sit     Supine to sit: Supervision     General bed mobility comments: slow labored movement    Transfers Overall transfer level: Needs assistance Equipment used: None Transfers: Sit to/from Stand, Bed to chair/wheelchair/BSC Sit to Stand: Min guard     Step pivot transfers: Min guard     General transfer comment: Min G assist for ransfers without AD; unsteady in standing.      Balance Overall balance assessment: Needs assistance Sitting-balance support: Bilateral upper extremity supported, Feet supported Sitting balance-Leahy Scale: Fair Sitting balance - Comments: Poor to fair seated EOB with UE support.   Standing balance support: Bilateral upper extremity supported, During functional activity, Reliant on assistive device for balance Standing balance-Leahy Scale: Poor Standing balance comment: Poor to fair in standing but limited assessment due to dizziness  ADL either performed or assessed with clinical judgement   ADL Overall ADL's : Needs assistance/impaired      Grooming: Standing;Min guard;Minimal assistance   Upper Body Bathing: Supervision/ safety;Sitting   Lower Body Bathing: Maximal assistance;Sitting/lateral leans   Upper Body Dressing : Supervision/safety;Sitting   Lower Body Dressing: Maximal assistance;Sitting/lateral leans Lower Body Dressing Details (indicate cue type and reason): Pt unable to doff socks seated in chair. Toilet Transfer: Psychologist, sport and exercise Details (indicate cue type and reason): Simulated via EOB to chair transer without AD Toileting- Clothing Manipulation and Hygiene: Min guard;Sitting/lateral lean       Functional mobility during ADLs: Min guard;Minimal assistance General ADL Comments: Pt blood pressure limiting session today.     Vision Baseline Vision/History: 1 Wears glasses Ability to See in Adequate Light: 1 Impaired Patient Visual Report: No change from baseline Vision Assessment?: No apparent visual deficits     Perception     Praxis      Pertinent Vitals/Pain Pain Assessment Pain Assessment: No/denies pain     Hand Dominance Right   Extremity/Trunk Assessment Upper Extremity Assessment Upper Extremity Assessment: Generalized weakness;LUE deficits/detail LUE Deficits / Details: 3-/5 shoulder flexion; gernally weak other than this. LUE Coordination: WNL   Lower Extremity Assessment Lower Extremity Assessment: Defer to PT evaluation   Cervical / Trunk Assessment Cervical / Trunk Assessment: Normal   Communication Communication Communication: Expressive difficulties   Cognition Arousal/Alertness: Awake/alert Behavior During Therapy: WFL for tasks assessed/performed Overall Cognitive Status: Within Functional Limits for tasks assessed                                                        Home Living Family/patient expects to be discharged to:: Private residence Living Arrangements: Alone Available Help at Discharge: Family;Available  PRN/intermittently ([facility] reports they would be able to provide 24/7 for a couple weeks if pt needed.) Type of Home: House Home Access: Ramped entrance     Home Layout: One level     Bathroom Shower/Tub: Occupational psychologist: Standard Bathroom Accessibility: Yes   Home Equipment: Grab bars - toilet;Grab bars - tub/shower;Hand held shower head;Shower seat;Cane - single Barista (2 wheels)   Additional Comments: Via pt report and previous history.      Prior Functioning/Environment Prior Level of Function : Needs assist       Physical Assist : ADLs (physical)   ADLs (physical): IADLs Mobility Comments: Family reports patient was able to ambulate at home with no AD. 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 Family visits to check on patient for safety with bathing.        OT Problem List: Decreased strength;Decreased activity tolerance;Impaired balance (sitting and/or standing)      OT Treatment/Interventions: Self-care/ADL training;Therapeutic exercise;Therapeutic activities;Patient/family education;Balance training    OT Goals(Current goals can be found in the care plan section) Acute Rehab OT Goals Patient Stated Goal: Family open to rehab if pt does not improve. OT Goal Formulation: With family Time For Goal Achievement: 08/22/21 Potential to Achieve Goals: Good  OT Frequency: Min 2X/week    Co-evaluation PT/OT/SLP Co-Evaluation/Treatment: Yes Reason for Co-Treatment: To address functional/ADL transfers PT goals addressed during session: Mobility/safety with  mobility;Balance;Strengthening/ROM OT goals addressed during session: ADL's and self-care      AM-PAC OT "6 Clicks" Daily Activity     Outcome Measure Help from another person eating meals?: None Help from another person taking care of personal grooming?: A  Little Help from another person toileting, which includes using toliet, bedpan, or urinal?: A Little Help from another person bathing (including washing, rinsing, drying)?: A Lot Help from another person to put on and taking off regular upper body clothing?: A Little Help from another person to put on and taking off regular lower body clothing?: Total 6 Click Score: 16   End of Session Nurse Communication: Other (comment) (notified of pt elevated BP)  Activity Tolerance: Other (comment) (Limited by elevated blood pressure.) Patient left: in chair;with call bell/phone within reach;with family/visitor present  OT Visit Diagnosis: Unsteadiness on feet (R26.81);Other abnormalities of gait and mobility (R26.89);Muscle weakness (generalized) (M62.81);Dizziness and giddiness (R42)                Time: 1610-9604 OT Time Calculation (min): 21 min Charges:  OT General Charges $OT Visit: 1 Visit OT Evaluation $OT Eval Low Complexity: 1 Low  Emin Foree OT, MOT  Larey Seat 08/08/2021, 12:40 PM

## 2021-08-08 NOTE — Evaluation (Signed)
Clinical/Bedside Swallow Evaluation Patient Details  Name: Veronica Paul MRN: [id number] Date of Birth: 09-Oct-1935  Today's Date: 08/08/2021 Time: SLP Start Time (ACUTE ONLY): 1600 SLP Stop Time (ACUTE ONLY): 1624 SLP Time Calculation (min) (ACUTE ONLY): 24 min  Past Medical History:  Past Medical History:  Diagnosis Date   Allergy    "my nose runs alot"   Anxiety    Arthritis    Atrial fibrillation ([place])    Blood transfusion without reported diagnosis    "when I had a miscarrage in 1957"   Cataract    bilateral removed   Cholelithiasis    Constipation    x ray showed increased stool in colon- uses Miralax daily and Dulcolax twice a week    Depression    Diabetes mellitus    Dyslipidemia    HTN (hypertension)    Hx of long-term (current) use of anticoagulants    Hyperlipidemia    Hypertension    Hypothyroidism    Lung cancer ([place])    Obesity    Osteopenia    Vitamin D deficiency    Past Surgical History:  Past Surgical History:  Procedure Laterality Date   CATARACT EXTRACTION Bilateral    COLONOSCOPY     COLONOSCOPY WITH PROPOFOL N/A 05/06/2019   Procedure: COLONOSCOPY WITH PROPOFOL;  Surgeon: Jerene Bears, MD;  Location: [facility];  Service: Gastroenterology;  Laterality: N/A;   LUNG REMOVAL, PARTIAL  1998   Right   POLYPECTOMY  05/06/2019   Procedure: POLYPECTOMY;  Surgeon: Jerene Bears, MD;  Location: [facility];  Service: Gastroenterology;;   THYROIDECTOMY     TUBAL LIGATION     UPPER GASTROINTESTINAL ENDOSCOPY     HPI:  Veronica Paul is a 86 y.o. female with medical history significant for atrial fibrillation on Eliquis for anticoagulation, hypertension, anxiety/depression, insulin-dependent diabetes, hypertension, hypothyroidism, GERD, and prior lung cancer with partial lung resection who was brought to the ED after she was noted to be significantly short of breath with tachycardia, hypotension, and hypoxemia around lunchtime while she was eating a  tomato sandwich.  She had significant respiratory distress upon arrival to the ED.  She has been compliant with her usual home medications, but did miss her dose of Eliquis yesterday.  There were no other symptoms that were noted prior to this event. According to the daughter, patient has been having consistent issues with swallowing, especially medications.  In the ED, patient was in significant respiratory distress and was noted to be hypoxemic with saturations in the 60th percentile.  She was additionally cyanotic.  She was placed on BiPAP and ABGs demonstrated some PCO2 elevation.  She was noted to be quite hypertensive and was started on nitroglycerin drip and was also noted to have atrial fibrillation with RVR for which she was started on Cardizem drip.  Nitroglycerin drip has now been weaned off with adequate blood pressure control and heart rates have improved as well.  Right lower lobe infiltrate was noted on chest x-ray with suspicion of pneumonia.  Leukocytosis of 13,700 also noted.  She was quickly weaned to nasal cannula oxygen. BSE requested. She had MBSS 02/25/21 with recommendations for D3/thin with reflux precautions due to esophageal dysmotility.    Assessment / Plan / Recommendation  Clinical Impression  Clinical swallow evaluation completed at bedside with Pt's daughter present. Her daughter reports that pt consumed a tomato sandwich and then drank a glass of milk without incident, but then started couging and became short of  breath prompting this admission. Oral motor examination is essentially WNL, Pt with some dried secretions. Pt had esophagram in January at [facility] which showed dysmotility, reflux, and suspected narrowing at the GE junction. She then had MBSS with recommendation for D3/thin. Pt assessed today with ice chips, thin water via cup/straw, puree, and regular textures. Pt without overt signs or symptoms of aspiration and no reports of globus. Suspect that Pt has primary  esophageal dysphagia given normal oropharyngeal swallow in January per MBSS and abnormal esophagram. Pt may have experienced esophageal stacking of bolus- consumed a sandwich and then glass of milk, with suspected reflux back into pharynx whick elicited coughing. Esophageal and reflux precautions were reviewed and provided in written form to Pt and daughter. Consider GI consult given esophagram showed suspected narrowing at the GE junction (if that has not already been addressed). Continue D3/thin with aspiration and reflux precautions. Medications whole in puree. No further SLP services indicated at this time. SLP Visit Diagnosis: Dysphagia, unspecified (R13.10)    Aspiration Risk  Mild aspiration risk (post prandial from relux?)    Diet Recommendation Dysphagia 3 (Mech soft);Thin liquid   Liquid Administration via: Cup;Straw Medication Administration: Whole meds with puree Supervision: Patient able to self feed Compensations: Slow rate;Small sips/bites;Multiple dry swallows after each bite/sip Postural Changes: Seated upright at 90 degrees;Remain upright for at least 30 minutes after po intake    Other  Recommendations Recommended Consults: Consider GI evaluation Oral Care Recommendations: Oral care BID;Staff/trained caregiver to provide oral care Other Recommendations: Clarify dietary restrictions    Recommendations for follow up therapy are one component of a multi-disciplinary discharge planning process, led by the attending physician.  Recommendations may be updated based on patient status, additional functional criteria and insurance authorization.  Follow up Recommendations No SLP follow up      Assistance Recommended at Discharge Intermittent Supervision/Assistance  Functional Status Assessment Patient has had a recent decline in their functional status and demonstrates the ability to make significant improvements in function in a reasonable and predictable amount of time.   Frequency and Duration            Prognosis Prognosis for Safe Diet Advancement: Fair Barriers to Reach Goals:  (esophageal component)      Swallow Study   General Date of Onset: 08/07/21 HPI: Veronica Paul is a 86 y.o. female with medical history significant for atrial fibrillation on Eliquis for anticoagulation, hypertension, anxiety/depression, insulin-dependent diabetes, hypertension, hypothyroidism, GERD, and prior lung cancer with partial lung resection who was brought to the ED after she was noted to be significantly short of breath with tachycardia, hypotension, and hypoxemia around lunchtime while she was eating a tomato sandwich.  She had significant respiratory distress upon arrival to the ED.  She has been compliant with her usual home medications, but did miss her dose of Eliquis yesterday.  There were no other symptoms that were noted prior to this event. According to the daughter, patient has been having consistent issues with swallowing, especially medications.  In the ED, patient was in significant respiratory distress and was noted to be hypoxemic with saturations in the 60th percentile.  She was additionally cyanotic.  She was placed on BiPAP and ABGs demonstrated some PCO2 elevation.  She was noted to be quite hypertensive and was started on nitroglycerin drip and was also noted to have atrial fibrillation with RVR for which she was started on Cardizem drip.  Nitroglycerin drip has now been weaned off with adequate blood  pressure control and heart rates have improved as well.  Right lower lobe infiltrate was noted on chest x-ray with suspicion of pneumonia.  Leukocytosis of 13,700 also noted.  She was quickly weaned to nasal cannula oxygen. BSE requested. She had MBSS 02/25/21 with recommendations for D3/thin with reflux precautions due to esophageal dysmotility. Type of Study: Bedside Swallow Evaluation Previous Swallow Assessment: MBSS 02/25/21 D3/thin, also had esophagram Diet  Prior to this Study: Dysphagia 3 (soft);Thin liquids Temperature Spikes Noted: No Respiratory Status: Nasal cannula History of Recent Intubation: No Behavior/Cognition: Alert;Cooperative;Pleasant mood Oral Cavity Assessment: Dry Oral Care Completed by SLP: Yes Oral Cavity - Dentition:  (partial U/L) Vision: Functional for self-feeding Self-Feeding Abilities: Able to feed self;Needs set up Patient Positioning: Upright in bed Baseline Vocal Quality: Normal Volitional Cough: Strong Volitional Swallow: Able to elicit    Oral/Motor/Sensory Function Overall Oral Motor/Sensory Function: Within functional limits   Ice Chips Ice chips: Within functional limits Presentation: Spoon   Thin Liquid Thin Liquid: Within functional limits Presentation: Cup;Straw;Self Fed;Spoon    Nectar Thick Nectar Thick Liquid: Not tested   Honey Thick Honey Thick Liquid: Not tested   Puree Puree: Within functional limits Presentation: Spoon   Solid     Solid: Within functional limits Presentation: Self Fed     Thank you,  Genene Churn, [place] 08/08/2021,4:36 PM

## 2021-08-08 NOTE — Progress Notes (Signed)
PROGRESS NOTE   Veronica Paul  KWI:[id number] DOB: Dec 22, 1935 DOA: 08/07/2021 PCP: Chevis Pretty, FNP   Chief Complaint  Patient presents with   Shortness of Breath   Level of care: Telemetry  Brief Admission History:   86 y.o. female with medical history significant for atrial fibrillation on Eliquis for anticoagulation, hypertension, anxiety/depression, insulin-dependent diabetes, hypertension, hypothyroidism, GERD, and prior lung cancer with partial lung resection who was brought to the ED after she was noted to be significantly short of breath with tachycardia, hypotension, and hypoxemia around lunchtime while she was eating a tomato sandwich.  She had significant respiratory distress upon arrival to the ED.  She has been compliant with her usual home medications, but did miss her dose of Eliquis yesterday.  There were no other symptoms that were noted prior to this event. According to the daughter, patient has been having consistent issues with swallowing, especially medications.   ED Course: In the ED, patient was in significant respiratory distress and was noted to be hypoxemic with saturations in the 60th percentile.  She was additionally cyanotic.  She was placed on BiPAP and ABGs demonstrated some PCO2 elevation.  She was noted to be quite hypertensive and was started on nitroglycerin drip and was also noted to have atrial fibrillation with RVR for which she was started on Cardizem drip.  Nitroglycerin drip has now been weaned off with adequate blood pressure control and heart rates have improved as well.  Right lower lobe infiltrate was noted on chest x-ray with suspicion of pneumonia.  Leukocytosis of 13,700 also noted.  She was quickly weaned to nasal cannula oxygen.   Assessment and Plan:  Acute hypoxemic respiratory failure possibly related to aspiration pneumonia -SIRS criteria present on admission, but does not appear to be related to sepsis -Change antibiotics to unasyn  for aspiration coverage -Blood cultures ordered and pending -Oxygen saturation improving dramatically -Noted to have chronic dysphagia and will be on Dys 3 diet for now until further eval as noted below   Atrial fibrillation with RVR likely related to above -Currently on Cardizem drip which will be weaned as tolerated -Continue home diltiazem and metoprolol -Monitor on telemetry -Continue anticoagulation with Eliquis   Hypertensive urgency -Required nitroglycerin drip in the ED which has been weaned off -Resume home blood pressure medications as noted below   History of hypertension -Continue home diltiazem, metoprolol, Lasix -BNP 677   Hypothyroidism -Continue levothyroxine    Chronic dysphagia -Supposed to be on dysphagia diet -SLP evaluation -Start on Dys 3 diet for now, based on prior recommendations in 1/23 -SLP requested GI consult given h/o esophageal dysmotility   Chronic abdominal pain with constipation -Continue laxation is needed -Last BM yesterday   Mild lactic acidosis -Start gentle IV fluid and recheck in a.m.   Insulin-dependent diabetes -Continue SSI while inpatient   Anxiety/depression -Continue Zoloft   Overweight -BMI 28.17 -Lifestyle changes outpatient    DVT prophylaxis: apixaban Code Status: full  Family Communication: daughter at bedside  Disposition: Status is: Inpatient Remains inpatient appropriate because: IV antibiotics ordered    Consultants:  SLP GI Procedures:   Antimicrobials:  unasyn IV    Subjective: Pt doesn't understand what happened or why she is having difficulty swallowing.   Objective: Vitals:   08/08/21 1200 08/08/21 1300 08/08/21 1400 08/08/21 1458  BP: 126/64 (!) 133/54 (!) 153/76 (!) 153/75  Pulse: 78 87 94 85  Resp: 17 (!) 23 19 18   Temp:    98.5 F (  36.9 C)  TempSrc:    Oral  SpO2: 97% 97% 95% 96%  Weight:      Height:        Intake/Output Summary (Last 24 hours) at 08/08/2021 1654 Last data  filed at 08/08/2021 1500 Gross per 24 hour  Intake 2100.59 ml  Output 300 ml  Net 1800.59 ml   Filed Weights   08/07/21 1432 08/08/21 0500  Weight: 72.1 kg 74.8 kg   Examination:  General exam: Appears calm and comfortable.  She seems confused at times with slowed cognition.  Respiratory system: Clear to auscultation. Respiratory effort normal. Cardiovascular system: normal S1 & S2 heard. No JVD, murmurs, rubs, gallops or clicks. No pedal edema. Gastrointestinal system: Abdomen is nondistended, soft and nontender. No organomegaly or masses felt. Normal bowel sounds heard. Central nervous system: Alert and oriented to person and place. No focal neurological deficits. Extremities: Symmetric 5 x 5 power. Skin: No rashes, lesions or ulcers. Psychiatry: Judgement and insight appear diminished. Mood & affect appropriate.   Data Reviewed: I have personally reviewed following labs and imaging studies  CBC: Recent Labs  Lab 08/07/21 1435 08/07/21 1440 08/08/21 0417  WBC  --  13.7* 9.8  HGB 18.4* 15.9* 12.1  HCT 54.0* 51.3* 39.2  MCV  --  89.1 89.3  PLT  --  382 299    Basic Metabolic Panel: Recent Labs  Lab 08/07/21 1435 08/07/21 1440 08/08/21 0417  NA 142 141 141  K 4.3 4.0 3.6  CL 102 102 107  CO2  --  27 26  GLUCOSE 163* 165* 106*  BUN 14 14 14   CREATININE 1.00 0.98 0.85  CALCIUM  --  9.5 8.6*  MG  --   --  2.0    CBG: Recent Labs  Lab 08/07/21 1750 08/07/21 2156 08/08/21 0738 08/08/21 1311  GLUCAP 138* 142* 105* 126*    Recent Results (from the past 240 hour(s))  Resp Panel by RT-PCR (Flu A&B, Covid) Anterior Nasal Swab     Status: None   Collection Time: 08/07/21  2:34 PM   Specimen: Anterior Nasal Swab  Result Value Ref Range Status   SARS Coronavirus 2 by RT PCR NEGATIVE NEGATIVE Final    Comment: (NOTE) SARS-CoV-2 target nucleic acids are NOT DETECTED.  The SARS-CoV-2 RNA is generally detectable in upper respiratory specimens during the acute  phase of infection. The lowest concentration of SARS-CoV-2 viral copies this assay can detect is 138 copies/mL. A negative result does not preclude SARS-Cov-2 infection and should not be used as the sole basis for treatment or other patient management decisions. A negative result may occur with  improper specimen collection/handling, submission of specimen other than nasopharyngeal swab, presence of viral mutation(s) within the areas targeted by this assay, and inadequate number of viral copies(<138 copies/mL). A negative result must be combined with clinical observations, patient history, and epidemiological information. The expected result is Negative.  Fact Sheet for Patients:  EntrepreneurPulse.com.au  Fact Sheet for Healthcare Providers:  IncredibleEmployment.be  This test is no t yet approved or cleared by the [place] FDA and  has been authorized for detection and/or diagnosis of SARS-CoV-2 by FDA under an Emergency Use Authorization (EUA). This EUA will remain  in effect (meaning this test can be used) for the duration of the COVID-19 declaration under Section 564(b)(1) of the Act, 21 U.S.C.section 360bbb-3(b)(1), unless the authorization is terminated  or revoked sooner.       Influenza A by PCR NEGATIVE NEGATIVE  Final   Influenza B by PCR NEGATIVE NEGATIVE Final    Comment: (NOTE) The Xpert Xpress SARS-CoV-2/FLU/RSV plus assay is intended as an aid in the diagnosis of influenza from Nasopharyngeal swab specimens and should not be used as a sole basis for treatment. Nasal washings and aspirates are unacceptable for Xpert Xpress SARS-CoV-2/FLU/RSV testing.  Fact Sheet for Patients: EntrepreneurPulse.com.au  Fact Sheet for Healthcare Providers: IncredibleEmployment.be  This test is not yet approved or cleared by the [place] FDA and has been authorized for detection and/or diagnosis of  SARS-CoV-2 by FDA under an Emergency Use Authorization (EUA). This EUA will remain in effect (meaning this test can be used) for the duration of the COVID-19 declaration under Section 564(b)(1) of the Act, 21 U.S.C. section 360bbb-3(b)(1), unless the authorization is terminated or revoked.  Performed at [facility], [address] [place], [place] [address]   Blood Culture (routine x 2)     Status: None (Preliminary result)   Collection Time: 08/07/21  3:42 PM   Specimen: Left Antecubital; Blood  Result Value Ref Range Status   Specimen Description LEFT ANTECUBITAL  Final   Special Requests   Final    BOTTLES DRAWN AEROBIC AND ANAEROBIC Blood Culture results may not be optimal due to an excessive volume of blood received in culture bottles   Culture   Final    NO GROWTH < 24 HOURS Performed at [facility], [address], [place], [place] [address]    Report Status PENDING  Incomplete  Blood Culture (routine x 2)     Status: None (Preliminary result)   Collection Time: 08/07/21  3:42 PM   Specimen: BLOOD LEFT HAND  Result Value Ref Range Status   Specimen Description BLOOD LEFT HAND  Final   Special Requests   Final    BOTTLES DRAWN AEROBIC ONLY Blood Culture adequate volume   Culture   Final    NO GROWTH < 24 HOURS Performed at [facility], [address] [place], [place] [address]    Report Status PENDING  Incomplete  MRSA Next Gen by PCR, Nasal     Status: None   Collection Time: 08/07/21  5:15 PM   Specimen: Nasal Mucosa; Nasal Swab  Result Value Ref Range Status   MRSA by PCR Next Gen NOT DETECTED NOT DETECTED Final    Comment: (NOTE) The GeneXpert MRSA Assay (FDA approved for NASAL specimens only), is one component of a comprehensive MRSA colonization surveillance program. It is not intended to diagnose MRSA infection nor to guide or monitor treatment for MRSA infections. Test performance is not FDA approved in patients less than 46 years old. Performed at [facility], [address] [place],  [address]      Radiology Studies: [facility] 1 View  Result Date: 08/07/2021 CLINICAL DATA:  Cough and short of breath.  Right lobectomy. EXAM: PORTABLE CHEST 1 VIEW COMPARISON:  Chest 02/22/2021 FINDINGS: New area of infiltrate in the right lung base with small associated right pleural effusion. Prior right lower lobectomy with surgical clips in the right lower hilum. Left lung remains clear.  Negative for heart failure. Right thoracotomy changes with chronic right rib fractures. IMPRESSION: New infiltrate right lower lobe with small right effusion. Probable pneumonia. Postop right lower lobectomy. Electronically Signed   By: Franchot Gallo M.D.   On: 08/07/2021 14:42    Scheduled Meds:  apixaban  5 mg Oral BID   Chlorhexidine Gluconate Cloth  6 each Topical Daily  diltiazem  90 mg Oral TID   feeding supplement  237 mL Oral TID BM   furosemide  20 mg Oral Daily   insulin aspart  0-5 Units Subcutaneous QHS   insulin aspart  0-9 Units Subcutaneous TID WC   levothyroxine  88 mcg Oral Q0600   melatonin  6 mg Oral QHS   metoprolol tartrate  150 mg Oral BID   pantoprazole  40 mg Oral Daily   sertraline  50 mg Oral Daily   sodium chloride flush  3 mL Intravenous Q12H   Continuous Infusions:  sodium chloride     ampicillin-sulbactam (UNASYN) IV       LOS: 0 days   Time spent: 36 mins  Kyrstan Gotwalt Wynetta Emery, MD How to contact the [facility] Attending or Consulting provider [place] or covering provider during after hours [place], for this patient?  Check the care team in [facility] and look for a) attending/consulting TR[facility] provider listed and b) the [facility] team listed Log into [email or web address] and use 's universal password to access. If you do not have the password, please contact the hospital operator. Locate the [facility] provider you are looking for under [facility] and page to a number that you can be directly reached. If you still have difficulty  reaching the provider, please page the [facility] (Director on Call) for the Hospitalists listed on amion for assistance.  08/08/2021, 4:54 PM

## 2021-08-08 NOTE — Plan of Care (Signed)
  Problem: Acute Rehab OT Goals (only OT should resolve) Goal: Pt. Will Perform Grooming Flowsheets (Taken 08/08/2021 1243) Pt Will Perform Grooming:  with modified independence  standing Goal: Pt. Will Perform Lower Body Bathing Flowsheets (Taken 08/08/2021 1243) Pt Will Perform Lower Body Bathing:  with modified independence  sitting/lateral leans Goal: Pt. Will Perform Lower Body Dressing Flowsheets (Taken 08/08/2021 1243) Pt Will Perform Lower Body Dressing:  with modified independence  sitting/lateral leans Goal: Pt. Will Transfer To Toilet [place] (Taken 08/08/2021 1243) Pt Will Transfer to Toilet:  with modified independence  stand pivot transfer Goal: Pt/Caregiver Will Perform Home Exercise Program Flowsheets (Taken 08/08/2021 1243) Pt/caregiver will Perform Home Exercise Program:  Increased ROM  Increased strength  With minimal assist  Lilliana Turner OT, MOT

## 2021-08-08 NOTE — Evaluation (Signed)
Physical Therapy Evaluation Patient Details Name: Veronica Paul MRN: [id number] DOB: 06-Jul-1935 Today's Date: 08/08/2021  History of Present Illness  Veronica Paul is a 86 y.o. female with medical history significant for atrial fibrillation on Eliquis for anticoagulation, hypertension, anxiety/depression, insulin-dependent diabetes, hypertension, hypothyroidism, GERD, and prior lung cancer with partial lung resection who was brought to the ED after she was noted to be significantly short of breath with tachycardia, hypotension, and hypoxemia around lunchtime while she was eating a tomato sandwich.  She had significant respiratory distress upon arrival to the ED.  She has been compliant with her usual home medications, but did miss her dose of Eliquis yesterday.  There were no other symptoms that were noted prior to this event. According to the daughter, patient has been having consistent issues with swallowing, especially medications.   Clinical Impression  Patient presents supine in bed with family present. Consents to PT/OT co-evaluation. Patient is supervision assist with bed mobility with need of verbal cueing and trunk control deficits requiring some assistance but patient able to regain control. Extended time needed and slow labored movement demonstrated. Vitals monitored in sitting with BP at 223/203 with sx of dizziness and nausea requiring rest break. Patient is min guard with transfers due to extended time needed and hand held stability provided due to patient reaching out for objects with balance and coordination deficits. Vitals reassessed after transfer with BP at 193/97 with similar sx reported. Limited to a few steps during transfer in which base of support impacted by forward trunk lean with balance deficits. Patient tolerated sitting up in chair after therapy while nursing staff present to address consistently high BP. Evaluation limited by patient sx and BP bordering outside therapeutic  range. Patient will benefit from continued skilled physical therapy in hospital and recommended venue below to increase strength, balance, endurance for safe ADLs and gait.      Recommendations for follow up therapy are one component of a multi-disciplinary discharge planning process, led by the attending physician.  Recommendations may be updated based on patient status, additional functional criteria and insurance authorization.  Follow Up Recommendations Skilled nursing-short term rehab (<3 hours/day) Can patient physically be transported by private vehicle: Yes    Assistance Recommended at Discharge Set up Supervision/Assistance  Patient can return home with the following  A little help with walking and/or transfers;Help with stairs or ramp for entrance;Assist for transportation;Assistance with cooking/housework    Equipment Recommendations None recommended by PT  Recommendations for Other Services       Functional Status Assessment Patient has had a recent decline in their functional status and demonstrates the ability to make significant improvements in function in a reasonable and predictable amount of time.     Precautions / Restrictions Precautions Precautions: Fall Restrictions Weight Bearing Restrictions: No      Mobility  Bed Mobility Overal bed mobility: Needs Assistance Bed Mobility: Supine to Sit     Supine to sit: Supervision     General bed mobility comments: Supervision assist with supine to sit. Extended time needed with slow labored movement demonstrated. Needs verbal cueing. Able to complete task but trunk control deficits observed.    Transfers Overall transfer level: Needs assistance Equipment used: None Transfers: Sit to/from Stand, Bed to chair/wheelchair/BSC Sit to Stand: Supervision, Min guard   Step pivot transfers: Supervision, Min guard       General transfer comment: Supervision/min guard assist with transfers due to extended time  needed and HHA provided for  stability.    Ambulation/Gait Ambulation/Gait assistance: Min guard, Supervision Gait Distance (Feet): 4 Feet Assistive device: None Gait Pattern/deviations: Decreased step length - left, Decreased step length - right, Step-to pattern, Decreased stride length, Narrow base of support Gait velocity: decreased     General Gait Details: Limited to a few steps in room. Min guard assist with no AD. Forward trunk lean with unsteadiness.  Stairs            Wheelchair Mobility    Modified Rankin (Stroke Patients Only)       Balance Overall balance assessment: Needs assistance Sitting-balance support: Bilateral upper extremity supported, Feet supported Sitting balance-Leahy Scale: Fair Sitting balance - Comments: Poor to fair seated EOB with UE support. Demonstrates some posterior leaning but able to correct   Standing balance support: Bilateral upper extremity supported, During functional activity, Reliant on assistive device for balance Standing balance-Leahy Scale: Poor Standing balance comment: Poor to fair in standing but limited assessment due to dizziness                             Pertinent Vitals/Pain Pain Assessment Pain Assessment: No/denies pain    Home Living Family/patient expects to be discharged to:: Private residence Living Arrangements: Alone Available Help at Discharge: Family Type of Home: House Home Access: Ramped entrance       Home Layout: One level Home Equipment: Grab bars - toilet;Grab bars - tub/shower;Hand held shower head;Shower seat      Prior Function Prior Level of Function : Needs assist       Physical Assist : ADLs (physical)   ADLs (physical): IADLs Mobility Comments: Family reports patient was able to ambulate at home with no AD. 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  Family visits to check on patient for safety with physical ADL's.     Hand Dominance   Dominant Hand: Right    Extremity/Trunk Assessment   Upper Extremity Assessment Upper Extremity Assessment: Defer to OT evaluation    Lower Extremity Assessment Lower Extremity Assessment: Generalized weakness    Cervical / Trunk Assessment Cervical / Trunk Assessment: Normal  Communication   Communication: Expressive difficulties  Cognition Arousal/Alertness: Awake/alert Behavior During Therapy: WFL for tasks assessed/performed Overall Cognitive Status: Within Functional Limits for tasks assessed                                          General Comments      Exercises     Assessment/Plan    PT Assessment Patient needs continued PT services;All further PT needs can be met in the next venue of care  PT Problem List Decreased strength;Decreased activity tolerance;Decreased balance;Decreased mobility;Decreased coordination       PT Treatment Interventions DME instruction;Gait training;Functional mobility training;Therapeutic activities;Therapeutic exercise;Balance training    PT Goals (Current goals can be found in the Care Plan section)  Acute Rehab PT Goals Patient Stated Goal: return home PT Goal Formulation: With patient/family Time For Goal Achievement: 08/22/21 Potential to Achieve Goals: Fair    Frequency Min 3X/week     Co-evaluation PT/OT/SLP Co-Evaluation/Treatment: Yes Reason for Co-Treatment: To address functional/ADL transfers PT goals addressed during session: Mobility/safety with mobility;Balance;Strengthening/ROM  AM-PAC PT "6 Clicks" Mobility  Outcome Measure Help needed turning from your back to your side while in a flat bed without using bedrails?: A Little Help needed moving from lying on your back to sitting on the side of a flat bed without using bedrails?: A Little Help needed moving to and from a bed to a chair (including  a wheelchair)?: A Little Help needed standing up from a chair using your arms (e.g., wheelchair or bedside chair)?: A Lot Help needed to walk in hospital room?: A Lot Help needed climbing 3-5 steps with a railing? : Total 6 Click Score: 14    End of Session   Activity Tolerance: Patient limited by fatigue;Treatment limited secondary to medical complications (Comment);Other (comment) (BP out of therapeutic range during assessment) Patient left: in chair;with family/visitor present;with nursing/sitter in room Nurse Communication: Mobility status PT Visit Diagnosis: Unsteadiness on feet (R26.81);Other abnormalities of gait and mobility (R26.89);Muscle weakness (generalized) (M62.81)    Time: 3383-2919 PT Time Calculation (min) (ACUTE ONLY): 27 min   Charges:   PT Evaluation $PT Eval Moderate Complexity: 1 Mod PT Treatments $Therapeutic Activity: 23-37 mins        10:18 AM, 08/08/21 Lestine Box, S/PT

## 2021-08-08 NOTE — Plan of Care (Signed)

## 2021-08-09 DIAGNOSIS — E119 Type 2 diabetes mellitus without complications: Secondary | ICD-10-CM | POA: Diagnosis not present

## 2021-08-09 DIAGNOSIS — J9601 Acute respiratory failure with hypoxia: Secondary | ICD-10-CM | POA: Diagnosis not present

## 2021-08-09 DIAGNOSIS — I4891 Unspecified atrial fibrillation: Secondary | ICD-10-CM | POA: Diagnosis not present

## 2021-08-09 DIAGNOSIS — J690 Pneumonitis due to inhalation of food and vomit: Secondary | ICD-10-CM | POA: Diagnosis not present

## 2021-08-09 LAB — GLUCOSE, CAPILLARY
Glucose-Capillary: 112 mg/dL — ABNORMAL HIGH (ref 70–99)
Glucose-Capillary: 144 mg/dL — ABNORMAL HIGH (ref 70–99)
Glucose-Capillary: 146 mg/dL — ABNORMAL HIGH (ref 70–99)
Glucose-Capillary: 128 mg/dL — ABNORMAL HIGH (ref 70–99)

## 2021-08-09 MED ORDER — SACCHAROMYCES BOULARDII 250 MG PO CAPS
250.0000 mg | ORAL_CAPSULE | Freq: Two times a day (BID) | ORAL | Status: DC
  Administered 2021-08-09 – 2021-08-12 (×7): 250 mg via ORAL
  Filled 2021-08-09 (×7): qty 1

## 2021-08-09 MED ORDER — AMOXICILLIN-POT CLAVULANATE 875-125 MG PO TABS
1.0000 | ORAL_TABLET | Freq: Two times a day (BID) | ORAL | Status: AC
Start: 2021-08-09 — End: 2021-08-11
  Administered 2021-08-09 – 2021-08-11 (×6): 1 via ORAL
  Filled 2021-08-09 (×6): qty 1

## 2021-08-09 NOTE — Care Management Important Message (Signed)
Important Message  Patient Details  Name: Veronica Paul MRN: [id number] Date of Birth: November 29, 1935   Medicare Important Message Given:  Yes     Corey Harold 08/09/2021, 2:47 PM

## 2021-08-10 DIAGNOSIS — R1310 Dysphagia, unspecified: Secondary | ICD-10-CM | POA: Diagnosis not present

## 2021-08-10 DIAGNOSIS — J9601 Acute respiratory failure with hypoxia: Secondary | ICD-10-CM | POA: Diagnosis not present

## 2021-08-10 DIAGNOSIS — I4891 Unspecified atrial fibrillation: Secondary | ICD-10-CM | POA: Diagnosis not present

## 2021-08-10 DIAGNOSIS — J690 Pneumonitis due to inhalation of food and vomit: Secondary | ICD-10-CM | POA: Diagnosis not present

## 2021-08-10 DIAGNOSIS — E119 Type 2 diabetes mellitus without complications: Secondary | ICD-10-CM | POA: Diagnosis not present

## 2021-08-10 LAB — GLUCOSE, CAPILLARY
Glucose-Capillary: 104 mg/dL — ABNORMAL HIGH (ref 70–99)
Glucose-Capillary: 113 mg/dL — ABNORMAL HIGH (ref 70–99)
Glucose-Capillary: 100 mg/dL — ABNORMAL HIGH (ref 70–99)
Glucose-Capillary: 85 mg/dL (ref 70–99)

## 2021-08-10 MED ORDER — OXYCODONE HCL 5 MG PO TABS
5.0000 mg | ORAL_TABLET | Freq: Four times a day (QID) | ORAL | Status: DC | PRN
  Administered 2021-08-10: 5 mg via ORAL
  Filled 2021-08-10: qty 1

## 2021-08-10 NOTE — Progress Notes (Signed)
Patient's insurance authorization is still pending at this time.  Madilyn Fireman, MSW, LCSW Transitions of Care  Clinical Social Worker II [phone]

## 2021-08-11 ENCOUNTER — Encounter (HOSPITAL_COMMUNITY)
Admission: EM | Disposition: A | Discharge status: Skilled Nursing Facility | Payer: Self-pay | Source: Home / Self Care | Attending: Family Medicine

## 2021-08-11 ENCOUNTER — Inpatient Hospital Stay (HOSPITAL_COMMUNITY): Payer: Medicare HMO | Admitting: Anesthesiology

## 2021-08-11 DIAGNOSIS — J690 Pneumonitis due to inhalation of food and vomit: Secondary | ICD-10-CM | POA: Diagnosis not present

## 2021-08-11 DIAGNOSIS — K449 Diaphragmatic hernia without obstruction or gangrene: Secondary | ICD-10-CM

## 2021-08-11 DIAGNOSIS — R933 Abnormal findings on diagnostic imaging of other parts of digestive tract: Secondary | ICD-10-CM

## 2021-08-11 DIAGNOSIS — J9601 Acute respiratory failure with hypoxia: Secondary | ICD-10-CM | POA: Diagnosis not present

## 2021-08-11 DIAGNOSIS — R1310 Dysphagia, unspecified: Secondary | ICD-10-CM | POA: Diagnosis not present

## 2021-08-11 DIAGNOSIS — E119 Type 2 diabetes mellitus without complications: Secondary | ICD-10-CM | POA: Diagnosis not present

## 2021-08-11 DIAGNOSIS — I4891 Unspecified atrial fibrillation: Secondary | ICD-10-CM | POA: Diagnosis not present

## 2021-08-11 HISTORY — PX: SAVORY DILATION: SHX5439

## 2021-08-11 HISTORY — PX: ESOPHAGOGASTRODUODENOSCOPY (EGD) WITH PROPOFOL: SHX5813

## 2021-08-11 LAB — GLUCOSE, CAPILLARY
Glucose-Capillary: 101 mg/dL — ABNORMAL HIGH (ref 70–99)
Glucose-Capillary: 92 mg/dL (ref 70–99)
Glucose-Capillary: 115 mg/dL — ABNORMAL HIGH (ref 70–99)
Glucose-Capillary: 114 mg/dL — ABNORMAL HIGH (ref 70–99)
Glucose-Capillary: 132 mg/dL — ABNORMAL HIGH (ref 70–99)

## 2021-08-11 SURGERY — ESOPHAGOGASTRODUODENOSCOPY (EGD) WITH PROPOFOL
Anesthesia: General

## 2021-08-11 MED ORDER — LIDOCAINE HCL (CARDIAC) PF 100 MG/5ML IV SOSY
PREFILLED_SYRINGE | INTRAVENOUS | Status: DC | PRN
  Administered 2021-08-11: 60 mg via INTRATRACHEAL

## 2021-08-11 MED ORDER — PROPOFOL 10 MG/ML IV BOLUS
INTRAVENOUS | Status: AC
  Filled 2021-08-11: qty 20

## 2021-08-11 MED ORDER — PROPOFOL 500 MG/50ML IV EMUL
INTRAVENOUS | Status: DC | PRN
  Administered 2021-08-11: 150 ug/kg/min via INTRAVENOUS

## 2021-08-11 MED ORDER — APIXABAN 5 MG PO TABS
5.0000 mg | ORAL_TABLET | Freq: Two times a day (BID) | ORAL | Status: DC
  Administered 2021-08-11 – 2021-08-12 (×2): 5 mg via ORAL
  Filled 2021-08-11 (×2): qty 1

## 2021-08-11 MED ORDER — METOPROLOL TARTRATE 5 MG/5ML IV SOLN
INTRAVENOUS | Status: DC | PRN
  Administered 2021-08-11 (×2): 2.5 mg via INTRAVENOUS

## 2021-08-11 MED ORDER — PHENOL 1.4 % MT LIQD
1.0000 | OROMUCOSAL | Status: DC | PRN
  Administered 2021-08-11: 1 via OROMUCOSAL
  Filled 2021-08-11: qty 177

## 2021-08-11 MED ORDER — PROPOFOL 10 MG/ML IV BOLUS
INTRAVENOUS | Status: DC | PRN
  Administered 2021-08-11: 50 mg via INTRAVENOUS

## 2021-08-11 MED ORDER — LIDOCAINE HCL (PF) 2 % IJ SOLN
INTRAMUSCULAR | Status: AC
  Filled 2021-08-11: qty 5

## 2021-08-11 MED ORDER — SODIUM CHLORIDE 0.9 % IV SOLN: INTRAVENOUS | Status: DC

## 2021-08-11 MED ORDER — LACTATED RINGERS IV SOLN: INTRAVENOUS | Status: DC | PRN

## 2021-08-12 DIAGNOSIS — J9601 Acute respiratory failure with hypoxia: Secondary | ICD-10-CM | POA: Diagnosis not present

## 2021-08-12 DIAGNOSIS — I4891 Unspecified atrial fibrillation: Secondary | ICD-10-CM | POA: Diagnosis not present

## 2021-08-12 DIAGNOSIS — R5381 Other malaise: Secondary | ICD-10-CM | POA: Diagnosis not present

## 2021-08-12 DIAGNOSIS — Z7401 Bed confinement status: Secondary | ICD-10-CM | POA: Diagnosis not present

## 2021-08-12 DIAGNOSIS — E559 Vitamin D deficiency, unspecified: Secondary | ICD-10-CM | POA: Diagnosis not present

## 2021-08-12 DIAGNOSIS — K219 Gastro-esophageal reflux disease without esophagitis: Secondary | ICD-10-CM | POA: Diagnosis not present

## 2021-08-12 DIAGNOSIS — M6281 Muscle weakness (generalized): Secondary | ICD-10-CM | POA: Diagnosis not present

## 2021-08-12 DIAGNOSIS — I10 Essential (primary) hypertension: Secondary | ICD-10-CM

## 2021-08-12 DIAGNOSIS — R41841 Cognitive communication deficit: Secondary | ICD-10-CM | POA: Diagnosis not present

## 2021-08-12 DIAGNOSIS — R4701 Aphasia: Secondary | ICD-10-CM | POA: Diagnosis not present

## 2021-08-12 DIAGNOSIS — R279 Unspecified lack of coordination: Secondary | ICD-10-CM | POA: Diagnosis not present

## 2021-08-12 DIAGNOSIS — J690 Pneumonitis due to inhalation of food and vomit: Secondary | ICD-10-CM | POA: Diagnosis not present

## 2021-08-12 DIAGNOSIS — E039 Hypothyroidism, unspecified: Secondary | ICD-10-CM | POA: Diagnosis not present

## 2021-08-12 DIAGNOSIS — R1312 Dysphagia, oropharyngeal phase: Secondary | ICD-10-CM | POA: Diagnosis not present

## 2021-08-12 DIAGNOSIS — I480 Paroxysmal atrial fibrillation: Secondary | ICD-10-CM | POA: Diagnosis not present

## 2021-08-12 DIAGNOSIS — E119 Type 2 diabetes mellitus without complications: Secondary | ICD-10-CM | POA: Diagnosis not present

## 2021-08-12 DIAGNOSIS — K582 Mixed irritable bowel syndrome: Secondary | ICD-10-CM | POA: Diagnosis not present

## 2021-08-12 DIAGNOSIS — R1310 Dysphagia, unspecified: Secondary | ICD-10-CM | POA: Diagnosis not present

## 2021-08-12 DIAGNOSIS — F411 Generalized anxiety disorder: Secondary | ICD-10-CM

## 2021-08-12 DIAGNOSIS — J9600 Acute respiratory failure, unspecified whether with hypoxia or hypercapnia: Secondary | ICD-10-CM | POA: Diagnosis not present

## 2021-08-12 DIAGNOSIS — R69 Illness, unspecified: Secondary | ICD-10-CM | POA: Diagnosis not present

## 2021-08-12 DIAGNOSIS — R262 Difficulty in walking, not elsewhere classified: Secondary | ICD-10-CM | POA: Diagnosis not present

## 2021-08-12 LAB — CULTURE, BLOOD (ROUTINE X 2)
Culture: NO GROWTH
Culture: NO GROWTH
Special Requests: ADEQUATE

## 2021-08-12 LAB — GLUCOSE, CAPILLARY
Glucose-Capillary: 114 mg/dL — ABNORMAL HIGH (ref 70–99)
Glucose-Capillary: 134 mg/dL — ABNORMAL HIGH (ref 70–99)

## 2021-08-12 MED ORDER — SACCHAROMYCES BOULARDII 250 MG PO CAPS: 250.0000 mg | ORAL_CAPSULE | Freq: Two times a day (BID) | ORAL | 0 refills | Status: AC

## 2021-08-12 NOTE — Progress Notes (Addendum)
Pt resting in brief intervals throughout the night, awakens easily during rounds by staff. Turned and repositioned throughout the night. VSS, tachypnea noted at times, afib on telemetry, rate controlled at this time. Daughter at bedside. No c/o pain or discomfort.

## 2021-08-13 ENCOUNTER — Encounter (HOSPITAL_COMMUNITY): Payer: Self-pay | Admitting: Gastroenterology

## 2021-08-13 DIAGNOSIS — J9600 Acute respiratory failure, unspecified whether with hypoxia or hypercapnia: Secondary | ICD-10-CM | POA: Diagnosis not present

## 2021-08-13 DIAGNOSIS — R5381 Other malaise: Secondary | ICD-10-CM | POA: Diagnosis not present

## 2021-08-15 DIAGNOSIS — R5381 Other malaise: Secondary | ICD-10-CM | POA: Diagnosis not present

## 2021-08-15 DIAGNOSIS — J9600 Acute respiratory failure, unspecified whether with hypoxia or hypercapnia: Secondary | ICD-10-CM | POA: Diagnosis not present

## 2021-08-15 DIAGNOSIS — M6281 Muscle weakness (generalized): Secondary | ICD-10-CM | POA: Diagnosis not present

## 2021-08-15 DIAGNOSIS — R4701 Aphasia: Secondary | ICD-10-CM | POA: Diagnosis not present

## 2021-08-19 ENCOUNTER — Ambulatory Visit (INDEPENDENT_AMBULATORY_CARE_PROVIDER_SITE_OTHER): Payer: Medicare HMO | Admitting: Nurse Practitioner

## 2021-08-19 ENCOUNTER — Encounter: Payer: Self-pay | Admitting: Nurse Practitioner

## 2021-08-19 VITALS — BP 106/65 | HR 70 | Temp 97.2°F | Resp 20 | Ht 63.0 in | Wt 155.0 lb

## 2021-08-19 DIAGNOSIS — Z09 Encounter for follow-up examination after completed treatment for conditions other than malignant neoplasm: Secondary | ICD-10-CM | POA: Diagnosis not present

## 2021-08-19 DIAGNOSIS — J690 Pneumonitis due to inhalation of food and vomit: Secondary | ICD-10-CM | POA: Diagnosis not present

## 2021-08-19 DIAGNOSIS — Z8619 Personal history of other infectious and parasitic diseases: Secondary | ICD-10-CM

## 2021-08-19 MED ORDER — PROMETHAZINE-DM 6.25-15 MG/5ML PO SYRP: 5.0000 mL | ORAL_SOLUTION | Freq: Four times a day (QID) | ORAL | 2 refills | Status: DC | PRN

## 2021-08-19 NOTE — Patient Instructions (Signed)
Aspiration Pneumonia, Adult  Aspiration pneumonia is an infection that occurs after lung (pulmonary) aspiration. Pulmonary aspiration is when you inhale a large amount of food, liquid, stomach acid, or saliva into the lungs. This can cause inflammation and infection in the lungs. This can make you cough and make it hard to breathe. Aspiration pneumonia is a serious condition and can be life-threatening. What are the causes? This condition may be caused by: Bacteria in food, liquid, stomach acid, or saliva that is inhaled into the lung. Irritation and inflammation that results from material, such as blood or a foreign body, being inhaled into the lung. This can lead to an infection even though the material is not originally contaminated with bacteria. What increases the risk? You are more likely to get aspiration pneumonia if you have a condition that makes it hard to breathe, swallow, cough, or gag. These conditions may include: A breathing disorder, such as chronic obstructive pulmonary disease, that makes it hard to eat or drink while breathing. A brain (neurologic) disorder, such as stroke, seizures, Parkinson's disease, dementia, amyotrophic lateral sclerosis (ALS), or brain injury. Having gastroesophageal reflux disease (GERD). Having a weak disease-fighting system (immune system). Having a narrowing of the tube that carries food to the stomach (esophageal narrowing). Other factors that may make you more likely to get aspiration pneumonia include: Being older than age 45 and frail. Being given a general anesthetic for procedures. Drinking too much alcohol and passing out. If you pass out and vomit, then vomit can be inhaled into your lungs. Taking certain medicines, such as tranquilizers or sedatives. Taking poor care of your mouth and teeth. Being malnourished. What are the signs or symptoms? The main symptom of pulmonary aspiration may be an episode of choking or coughing while eating or  drinking. When aspiration pneumonia develops, symptoms include: Persistent cough. Difficulty breathing, such as wheezing or shortness of breath. Fever. Chest pain. Being more tired than usual (fatigue). Pulmonary aspiration may be silent, meaning that it is not associated with coughing or choking while eating or drinking. How is this diagnosed? This condition may be diagnosed based on: A physical exam. Tests, such as: A chest X-ray. A sputum culture. Saliva and mucus (sputum) are collected from the lungs or the tubes that carry air to the lungs (bronchi). The sputum is then tested for bacteria. Oximetry. A sensor or clip is placed on areas such as a finger, earlobe, or toe to measure the oxygen level in your blood. Blood tests. A swallowing study. This test looks at how food is swallowed and whether it goes into your windpipe (trachea) or esophagus. A bronchoscopy. This test uses a flexible tube (bronchoscope) to see inside the lungs. How is this treated? This condition may be treated with: Medicines. Antibiotic medicine will be given to kill the pneumonia bacteria. Other medicines may also be used to reduce fever, pain, or inflammation. Breathing assistance and oxygen therapy. Depending on how well you are breathing, you may need to be given oxygen, or you may need breathing support from a breathing machine (ventilator). Thoracentesis. This is a procedure to remove fluid that has built up in the space between the linings of the chest wall and the lungs. Dietary changes. You may need to avoid certain food textures or liquids. For people who have recurrent aspiration pneumonia, a feeding tube might be placed in the stomach for nutrition. Follow these instructions at home: Medicines  Take over-the-counter and prescription medicines only as told by your health care  provider. If you were prescribed an antibiotic medicine, take it as told by your health care provider. Do not stop taking the  antibiotic even if you start to feel better. Take cough medicine only if you are losing sleep. Cough medicine can prevent your body's natural ability to remove mucus from your lungs. General instructions  Carefully follow any eating instructions you were given, such as avoiding certain food textures or thickening your liquids. Thickening liquids reduces the risk of developing aspiration pneumonia again. Return to normal activities as told by your health care provider. Ask your health care provider what activities are safe for you. Sleep in a semi-upright position at night. Try to sleep in a reclining chair, or place a few pillows under your head in bed. Do not use any products that contain nicotine or tobacco, such as cigarettes, e-cigarettes, and chewing tobacco. If you need help quitting, ask your health care provider. Keep all follow-up visits as told by your health care provider. This is important. Contact a health care provider if you: Have a fever. Are coughing or choking while eating or drinking. Continue to have signs or symptoms of aspiration pneumonia. Get help right away if you have: Worsening shortness of breath, wheezing, or difficulty breathing. Chest pain. Summary Aspiration pneumonia is an infection that occurs after lung (pulmonary) aspiration. Pulmonary aspiration is when you inhale a large amount of food, liquid, stomach acid, or saliva into the lungs. The main symptom of pulmonary aspiration may be an episode of choking or coughing. It may also be silent without coughing or choking. You are more likely to get aspiration pneumonia if you have a condition that makes it hard to breathe, swallow, cough, or gag. This information is not intended to replace advice given to you by your health care provider. Make sure you discuss any questions you have with your health care provider. Document Revised: 02/04/2019 Document Reviewed: 02/04/2019 Elsevier Patient Education  [place].

## 2021-08-19 NOTE — Progress Notes (Signed)
Subjective:    Patient ID: Veronica Paul, female    DOB: 1936-02-15, 86 y.o.   MRN: [id number]   Chief Complaint: rehab follow up   HPI Patient went to the hospital on 08/07/21 and was dx with sepsis and aspiration pneumonia. She was treated and discharged on 08/12/21. She went to SNF after leaving hospital. She is now home and is here for follow up. The therapy people taught them some things to do to help build up her strength. [facility] PT but family has not herd anything. Patient says she feels tired a weak.   Has cough that started several months ago. Would like some cough meds. Nagging cough.  Review of Systems  Constitutional:  Negative for diaphoresis.  Eyes:  Negative for pain.  Respiratory:  Negative for shortness of breath.   Cardiovascular:  Negative for chest pain, palpitations and leg swelling.  Gastrointestinal:  Negative for abdominal pain.  Endocrine: Negative for polydipsia.  Skin:  Negative for rash.  Neurological:  Negative for dizziness, weakness and headaches.  Hematological:  Does not bruise/bleed easily.  All other systems reviewed and are negative.      Objective:   Physical Exam Vitals and nursing note reviewed.  Constitutional:      General: She is not in acute distress.    Appearance: Normal appearance. She is well-developed.  Neck:     Vascular: No carotid bruit or JVD.  Cardiovascular:     Rate and Rhythm: Normal rate and regular rhythm.     Heart sounds: Normal heart sounds.     Comments: Occasional PAC's Pulmonary:     Effort: Pulmonary effort is normal. No respiratory distress.     Breath sounds: Normal breath sounds. No wheezing or rales.  Chest:     Chest wall: No tenderness.  Abdominal:     General: Bowel sounds are normal. There is no distension or abdominal bruit.     Palpations: Abdomen is soft. There is no hepatomegaly, splenomegaly, mass or pulsatile mass.     Tenderness: There is no abdominal  tenderness.  Musculoskeletal:        General: Normal range of motion.     Cervical back: Normal range of motion and neck supple.     Comments: Rises slowly from sitting to standing. Gait slow and steady  Lymphadenopathy:     Cervical: No cervical adenopathy.  Skin:    General: Skin is warm and dry.  Neurological:     Mental Status: She is alert and oriented to person, place, and time.     Deep Tendon Reflexes: Reflexes are normal and symmetric.  Psychiatric:        Behavior: Behavior normal.        Thought Content: Thought content normal.        Judgment: Judgment normal.     BP 106/65   Pulse 70   Temp (!) 97.2 F (36.2 C) (Temporal)   Resp 20   Ht '5\' 3"'  (1.6 m)   Wt 155 lb (70.3 kg)   LMP 05/11/1992   BMI 27.46 kg/m        Assessment & Plan:   Veronica Paul in today with chief complaint of rehab follow up (Wants something for cough. Can't take robitussin due to interaction with zoloft)   1. Personal history of sepsis  2. Aspiration pneumonia of both lower lobes due to regurgitated food ([place]) Used techniques learned at rehab for swallowing  Will repeat chest xray in 2 weeks  3. Hospital discharge follow-up Hospital records reviewed - CMP14+EGFR - CBC with Differential/Platelet  4. Cough Meds ordered this encounter  Medications   promethazine-dextromethorphan (PROMETHAZINE-DM) 6.25-15 MG/5ML syrup    Sig: Take 5 mLs by mouth 4 (four) times daily as needed for cough.    Dispense:  118 mL    Refill:  2    Order Specific Question:   Supervising Provider    Answer:   Caryl Pina A [[id number]]     The above assessment and management plan was discussed with the patient. The patient verbalized understanding of and has agreed to the management plan. Patient is aware to call the clinic if symptoms persist or worsen. Patient is aware when to return to the clinic for a follow-up visit. Patient educated on when it is appropriate to go to the emergency department.    [name], FNP

## 2021-08-20 LAB — CMP14+EGFR
ALT: 23 IU/L (ref 0–32)
AST: 33 IU/L (ref 0–40)
Albumin/Globulin Ratio: 1.5 (ref 1.2–2.2)
Albumin: 4 g/dL (ref 3.6–4.6)
Alkaline Phosphatase: 99 IU/L (ref 44–121)
BUN/Creatinine Ratio: 19 (ref 12–28)
BUN: 21 mg/dL (ref 8–27)
Bilirubin Total: 0.3 mg/dL (ref 0.0–1.2)
CO2: 25 mmol/L (ref 20–29)
Calcium: 9 mg/dL (ref 8.7–10.3)
Chloride: 103 mmol/L (ref 96–106)
Creatinine, Ser: 1.12 mg/dL — ABNORMAL HIGH (ref 0.57–1.00)
Globulin, Total: 2.6 g/dL (ref 1.5–4.5)
Glucose: 117 mg/dL — ABNORMAL HIGH (ref 70–99)
Potassium: 4.4 mmol/L (ref 3.5–5.2)
Sodium: 143 mmol/L (ref 134–144)
Total Protein: 6.6 g/dL (ref 6.0–8.5)
eGFR: 49 mL/min/{1.73_m2} — ABNORMAL LOW (ref >59)

## 2021-08-20 LAB — CBC WITH DIFFERENTIAL/PLATELET
Basophils Absolute: 0.1 10*3/uL (ref 0.0–0.2)
Basos: 1 %
EOS (ABSOLUTE): 0.6 10*3/uL — ABNORMAL HIGH (ref 0.0–0.4)
Eos: 7 %
Hematocrit: 43.4 % (ref 34.0–46.6)
Hemoglobin: 14.2 g/dL (ref 11.1–15.9)
Immature Grans (Abs): 0 10*3/uL (ref 0.0–0.1)
Immature Granulocytes: 0 %
Lymphocytes Absolute: 1.6 10*3/uL (ref 0.7–3.1)
Lymphs: 18 %
MCH: 27.7 pg (ref 26.6–33.0)
MCHC: 32.7 g/dL (ref 31.5–35.7)
MCV: 85 fL (ref 79–97)
Monocytes Absolute: 0.7 10*3/uL (ref 0.1–0.9)
Monocytes: 8 %
Neutrophils Absolute: 5.6 10*3/uL (ref 1.4–7.0)
Neutrophils: 66 %
Platelets: 297 10*3/uL (ref 150–450)
RBC: 5.12 x10E6/uL (ref 3.77–5.28)
RDW: 14.9 % (ref 11.7–15.4)
WBC: 8.7 10*3/uL (ref 3.4–10.8)

## 2021-08-21 DIAGNOSIS — E559 Vitamin D deficiency, unspecified: Secondary | ICD-10-CM | POA: Diagnosis not present

## 2021-08-21 DIAGNOSIS — E785 Hyperlipidemia, unspecified: Secondary | ICD-10-CM | POA: Diagnosis not present

## 2021-08-21 DIAGNOSIS — Z9181 History of falling: Secondary | ICD-10-CM | POA: Diagnosis not present

## 2021-08-21 DIAGNOSIS — E119 Type 2 diabetes mellitus without complications: Secondary | ICD-10-CM | POA: Diagnosis not present

## 2021-08-21 DIAGNOSIS — R69 Illness, unspecified: Secondary | ICD-10-CM | POA: Diagnosis not present

## 2021-08-21 DIAGNOSIS — K5900 Constipation, unspecified: Secondary | ICD-10-CM | POA: Diagnosis not present

## 2021-08-21 DIAGNOSIS — Z9841 Cataract extraction status, right eye: Secondary | ICD-10-CM | POA: Diagnosis not present

## 2021-08-21 DIAGNOSIS — Z7901 Long term (current) use of anticoagulants: Secondary | ICD-10-CM | POA: Diagnosis not present

## 2021-08-21 DIAGNOSIS — Z9842 Cataract extraction status, left eye: Secondary | ICD-10-CM | POA: Diagnosis not present

## 2021-08-21 DIAGNOSIS — M8580 Other specified disorders of bone density and structure, unspecified site: Secondary | ICD-10-CM | POA: Diagnosis not present

## 2021-08-21 DIAGNOSIS — J9601 Acute respiratory failure with hypoxia: Secondary | ICD-10-CM | POA: Diagnosis not present

## 2021-08-21 DIAGNOSIS — K219 Gastro-esophageal reflux disease without esophagitis: Secondary | ICD-10-CM | POA: Diagnosis not present

## 2021-08-21 DIAGNOSIS — R1310 Dysphagia, unspecified: Secondary | ICD-10-CM | POA: Diagnosis not present

## 2021-08-21 DIAGNOSIS — Z6828 Body mass index (BMI) 28.0-28.9, adult: Secondary | ICD-10-CM | POA: Diagnosis not present

## 2021-08-21 DIAGNOSIS — Z85118 Personal history of other malignant neoplasm of bronchus and lung: Secondary | ICD-10-CM | POA: Diagnosis not present

## 2021-08-21 DIAGNOSIS — E872 Acidosis, unspecified: Secondary | ICD-10-CM | POA: Diagnosis not present

## 2021-08-21 DIAGNOSIS — M1990 Unspecified osteoarthritis, unspecified site: Secondary | ICD-10-CM | POA: Diagnosis not present

## 2021-08-21 DIAGNOSIS — I10 Essential (primary) hypertension: Secondary | ICD-10-CM | POA: Diagnosis not present

## 2021-08-21 DIAGNOSIS — K589 Irritable bowel syndrome without diarrhea: Secondary | ICD-10-CM | POA: Diagnosis not present

## 2021-08-21 DIAGNOSIS — E039 Hypothyroidism, unspecified: Secondary | ICD-10-CM | POA: Diagnosis not present

## 2021-08-21 DIAGNOSIS — I4891 Unspecified atrial fibrillation: Secondary | ICD-10-CM | POA: Diagnosis not present

## 2021-08-27 ENCOUNTER — Other Ambulatory Visit: Payer: Self-pay

## 2021-08-27 ENCOUNTER — Encounter (HOSPITAL_COMMUNITY): Payer: Self-pay | Admitting: Emergency Medicine

## 2021-08-27 ENCOUNTER — Emergency Department (HOSPITAL_COMMUNITY): Payer: Medicare HMO

## 2021-08-27 ENCOUNTER — Emergency Department (HOSPITAL_COMMUNITY)
Admission: EM | Admit: 2021-08-27 | Discharge: 2021-08-27 | Disposition: A | Discharge status: Home / Self Care | Payer: Medicare HMO | Attending: Emergency Medicine | Admitting: Emergency Medicine

## 2021-08-27 DIAGNOSIS — R52 Pain, unspecified: Secondary | ICD-10-CM | POA: Diagnosis not present

## 2021-08-27 DIAGNOSIS — E039 Hypothyroidism, unspecified: Secondary | ICD-10-CM | POA: Diagnosis not present

## 2021-08-27 DIAGNOSIS — S0990XA Unspecified injury of head, initial encounter: Secondary | ICD-10-CM | POA: Diagnosis not present

## 2021-08-27 DIAGNOSIS — Z7901 Long term (current) use of anticoagulants: Secondary | ICD-10-CM | POA: Insufficient documentation

## 2021-08-27 DIAGNOSIS — Z743 Need for continuous supervision: Secondary | ICD-10-CM | POA: Diagnosis not present

## 2021-08-27 DIAGNOSIS — S0003XA Contusion of scalp, initial encounter: Secondary | ICD-10-CM | POA: Diagnosis not present

## 2021-08-27 DIAGNOSIS — R58 Hemorrhage, not elsewhere classified: Secondary | ICD-10-CM | POA: Diagnosis not present

## 2021-08-27 DIAGNOSIS — Z85118 Personal history of other malignant neoplasm of bronchus and lung: Secondary | ICD-10-CM | POA: Diagnosis not present

## 2021-08-27 DIAGNOSIS — E119 Type 2 diabetes mellitus without complications: Secondary | ICD-10-CM | POA: Diagnosis not present

## 2021-08-27 DIAGNOSIS — W19XXXA Unspecified fall, initial encounter: Secondary | ICD-10-CM | POA: Diagnosis not present

## 2021-08-27 DIAGNOSIS — R519 Headache, unspecified: Secondary | ICD-10-CM | POA: Diagnosis not present

## 2021-08-27 DIAGNOSIS — Y92009 Unspecified place in unspecified non-institutional (private) residence as the place of occurrence of the external cause: Secondary | ICD-10-CM | POA: Diagnosis not present

## 2021-08-27 DIAGNOSIS — G319 Degenerative disease of nervous system, unspecified: Secondary | ICD-10-CM | POA: Diagnosis not present

## 2021-08-27 DIAGNOSIS — I6782 Cerebral ischemia: Secondary | ICD-10-CM | POA: Diagnosis not present

## 2021-08-27 DIAGNOSIS — I10 Essential (primary) hypertension: Secondary | ICD-10-CM | POA: Insufficient documentation

## 2021-08-27 DIAGNOSIS — I4891 Unspecified atrial fibrillation: Secondary | ICD-10-CM | POA: Diagnosis not present

## 2021-08-27 DIAGNOSIS — S199XXA Unspecified injury of neck, initial encounter: Secondary | ICD-10-CM | POA: Diagnosis not present

## 2021-08-27 DIAGNOSIS — M47812 Spondylosis without myelopathy or radiculopathy, cervical region: Secondary | ICD-10-CM | POA: Diagnosis not present

## 2021-08-27 NOTE — Discharge Instructions (Signed)
You were evaluated in the Emergency Department and after careful evaluation, we did not find any emergent condition requiring admission or further testing in the hospital.  Your exam/testing today is overall reassuring.  CT scan did not show any significant injuries or emergencies.  Please return to the Emergency Department if you experience any worsening of your condition.   Thank you for allowing [place] to be a part of your care.

## 2021-08-27 NOTE — ED Provider Notes (Signed)
[facility] Emergency Department Provider Note MRN:  [id number]  Arrival date & time: 08/27/21     Chief Complaint   Fall   History of Present Illness   Veronica Paul is a 86 y.o. year-old female with a history of A-fib presenting to the ED with chief complaint of fall.  Unwitnessed fall, found on the ground, family thinks that she fell while trying to use the bathroom.  Hematoma to the back of the head.  Denies any other complaints.  Review of Systems  A thorough review of systems was obtained and all systems are negative except as noted in the HPI and PMH.   Patient's Health History    Past Medical History:  Diagnosis Date   Allergy    "my nose runs alot"   Anxiety    Arthritis    Atrial fibrillation ([place])    Blood transfusion without reported diagnosis    "when I had a miscarrage in 1957"   Cataract    bilateral removed   Cholelithiasis    Constipation    x ray showed increased stool in colon- uses Miralax daily and Dulcolax twice a week    Depression    Diabetes mellitus    Dyslipidemia    HTN (hypertension)    Hx of long-term (current) use of anticoagulants    Hyperlipidemia    Hypertension    Hypothyroidism    Lung cancer ([place])    Obesity    Osteopenia    Vitamin D deficiency     Past Surgical History:  Procedure Laterality Date   CATARACT EXTRACTION Bilateral    COLONOSCOPY     COLONOSCOPY WITH PROPOFOL N/A 05/06/2019   Procedure: COLONOSCOPY WITH PROPOFOL;  Surgeon: Jerene Bears, MD;  Location: [facility];  Service: Gastroenterology;  Laterality: N/A;   ESOPHAGOGASTRODUODENOSCOPY (EGD) WITH PROPOFOL N/A 08/11/2021   Procedure: ESOPHAGOGASTRODUODENOSCOPY (EGD) WITH PROPOFOL;  Surgeon: Harvel Quale, MD;  Location: [facility];  Service: Gastroenterology;  Laterality: N/A;   LUNG REMOVAL, PARTIAL  1998   Right   POLYPECTOMY  05/06/2019   Procedure: POLYPECTOMY;  Surgeon: Jerene Bears, MD;  Location: [facility];   Service: Gastroenterology;;   Azzie Almas DILATION  08/11/2021   Procedure: Azzie Almas DILATION;  Surgeon: Montez Morita, Quillian Quince, MD;  Location: [facility];  Service: Gastroenterology;;   THYROIDECTOMY     TUBAL LIGATION     UPPER GASTROINTESTINAL ENDOSCOPY      Family History  Problem Relation Age of Onset   Coronary artery disease Brother 75   Hypertension Brother    Heart disease Brother    Hypertension Mother    Hypertension Father    Heart disease Father    Hypertension Sister    Heart disease Sister    Cancer Sister    Arthritis Daughter    COPD Daughter    Fibromyalgia Daughter    Ulcerative colitis Son    Stroke Maternal Grandmother    Cancer Brother    Lung cancer Brother    Hypertension Sister    Cancer Sister    Colon cancer Neg Hx    Food intolerance Neg Hx    Esophageal cancer Neg Hx    Rectal cancer Neg Hx    Stomach cancer Neg Hx    Colon polyps Neg Hx     Social History   Socioeconomic History   Marital status: Widowed    Spouse name: Not on file   Number of children: 4  Years of education: 62   Highest education level: 12th grade  Occupational History   Occupation: Retired    Comment: Conservation officer, historic buildings  Tobacco Use   Smoking status: Never   Smokeless tobacco: Never   Tobacco comments:    Never smoke 05/15/21  Vaping Use   Vaping Use: Never used  Substance and Sexual Activity   Alcohol use: No   Drug use: No   Sexual activity: Not Currently  Other Topics Concern   Not on file  Social History Narrative   Lives alone - one level   Social Determinants of Health   Financial Resource Strain: Low Risk  (09/17/2020)   Overall Financial Resource Strain (CARDIA)    Difficulty of Paying Living Expenses: Not very hard  Food Insecurity: No Food Insecurity (09/17/2020)   Hunger Vital Sign    Worried About Running Out of Food in the Last Year: Never true    Ran Out of Food in the Last Year: Never true  Transportation Needs: No Transportation Needs  (09/17/2020)   PRAPARE - Hydrologist (Medical): No    Lack of Transportation (Non-Medical): No  Physical Activity: Insufficiently Active (09/17/2020)   Exercise Vital Sign    Days of Exercise per Week: 7 days    Minutes of Exercise per Session: 20 min  Stress: Stress Concern Present (09/17/2020)   [place]    Feeling of Stress : To some extent  Social Connections: Moderately Isolated (09/17/2020)   Social Connection and Isolation Panel [NHANES]    Frequency of Communication with Friends and Family: More than three times a week    Frequency of Social Gatherings with Friends and Family: More than three times a week    Attends Religious Services: 1 to 4 times per year    Active Member of [employer] or Organizations: No    Attends Archivist Meetings: Never    Marital Status: Widowed  Intimate Partner Violence: Not At Risk (09/17/2020)   Humiliation, Afraid, Rape, and Kick questionnaire    Fear of Current or Ex-Partner: No    Emotionally Abused: No    Physically Abused: No    Sexually Abused: No     Physical Exam   Vitals:   08/27/21 0642 08/27/21 0650  BP:  (!) 144/65  Pulse: 94 (!) 108  Resp: (!) 27 (!) 23  Temp:    SpO2: 95% 95%    CONSTITUTIONAL: Well-appearing, NAD NEURO/PSYCH:  Alert and oriented x 3, no focal deficits EYES:  eyes equal and reactive ENT/NECK:  no LAD, no JVD CARDIO: Regular rate, well-perfused, normal S1 and S2 PULM:  CTAB no wheezing or rhonchi GI/GU:  non-distended, non-tender MSK/SPINE:  No gross deformities, no edema SKIN:  no rash, hematoma to right occiput   *Additional and/or pertinent findings included in MDM below  Diagnostic and Interventional Summary    EKG Interpretation  Date/Time:    Ventricular Rate:    PR Interval:    QRS Duration:   QT Interval:    QTC Calculation:   R Axis:     Text Interpretation:         Labs Reviewed - No  data to display  CT HEAD WO CONTRAST (5MM)  Final Result    CT CERVICAL SPINE WO CONTRAST  Final Result      Medications - No data to display   Procedures  /  Critical Care Procedures  ED Course  and Medical Decision Making  Initial Impression and Ddx Head trauma, suspect mechanical fall, anticoagulated, will need CT imaging to exclude bleeding intracranially  Past medical/surgical history that increases complexity of ED encounter: Anticoagulated  Interpretation of Diagnostics I personally reviewed the CT head and my interpretation is as follows: No obvious bleeding    Patient Reassessment and Ultimate Disposition/Management     Appropriate for discharge.  Patient management required discussion with the following services or consulting groups:  None  Complexity of Problems Addressed Acute illness or injury that poses threat of life of bodily function  Additional Data Reviewed and Analyzed Further history obtained from: Further history from spouse/family member  Additional Factors Impacting ED Encounter Risk Consideration of hospitalization  Barth Kirks. Sedonia Small, MD [place] [email or web address]  Final Clinical Impressions(s) / ED Diagnoses     ICD-10-CM   1. Fall, initial encounter  W19.XXXA     2. Hematoma of scalp, initial encounter  [id number].[email or web address]       ED Discharge Orders     None        Discharge Instructions Discussed with and Provided to Patient:     Discharge Instructions      You were evaluated in the Emergency Department and after careful evaluation, we did not find any emergent condition requiring admission or further testing in the hospital.  Your exam/testing today is overall reassuring.  CT scan did not show any significant injuries or emergencies.  Please return to the Emergency Department if you experience any worsening of your condition.   Thank you for allowing [place] to be a part of your  care.       Maudie Flakes, MD 08/27/21 [phone]

## 2021-08-27 NOTE — ED Triage Notes (Signed)
Pt had unwitnessed fall at home while trying to get to potty chair. Pt struck back of head and is on blood thinners.

## 2021-08-27 NOTE — ED Notes (Signed)
ED Provider at bedside. 

## 2021-08-28 ENCOUNTER — Ambulatory Visit (INDEPENDENT_AMBULATORY_CARE_PROVIDER_SITE_OTHER): Payer: Medicare HMO

## 2021-08-28 DIAGNOSIS — F411 Generalized anxiety disorder: Secondary | ICD-10-CM

## 2021-08-28 DIAGNOSIS — K5900 Constipation, unspecified: Secondary | ICD-10-CM

## 2021-08-28 DIAGNOSIS — E039 Hypothyroidism, unspecified: Secondary | ICD-10-CM | POA: Diagnosis not present

## 2021-08-28 DIAGNOSIS — I4891 Unspecified atrial fibrillation: Secondary | ICD-10-CM

## 2021-08-28 DIAGNOSIS — E872 Acidosis, unspecified: Secondary | ICD-10-CM

## 2021-08-28 DIAGNOSIS — Z9842 Cataract extraction status, left eye: Secondary | ICD-10-CM | POA: Diagnosis not present

## 2021-08-28 DIAGNOSIS — Z9181 History of falling: Secondary | ICD-10-CM | POA: Diagnosis not present

## 2021-08-28 DIAGNOSIS — Z85118 Personal history of other malignant neoplasm of bronchus and lung: Secondary | ICD-10-CM | POA: Diagnosis not present

## 2021-08-28 DIAGNOSIS — Z9841 Cataract extraction status, right eye: Secondary | ICD-10-CM | POA: Diagnosis not present

## 2021-08-28 DIAGNOSIS — M1990 Unspecified osteoarthritis, unspecified site: Secondary | ICD-10-CM

## 2021-08-28 DIAGNOSIS — K219 Gastro-esophageal reflux disease without esophagitis: Secondary | ICD-10-CM

## 2021-08-28 DIAGNOSIS — F32A Depression, unspecified: Secondary | ICD-10-CM

## 2021-08-28 DIAGNOSIS — I10 Essential (primary) hypertension: Secondary | ICD-10-CM

## 2021-08-28 DIAGNOSIS — E119 Type 2 diabetes mellitus without complications: Secondary | ICD-10-CM | POA: Diagnosis not present

## 2021-08-28 DIAGNOSIS — K589 Irritable bowel syndrome without diarrhea: Secondary | ICD-10-CM

## 2021-08-28 DIAGNOSIS — R69 Illness, unspecified: Secondary | ICD-10-CM | POA: Diagnosis not present

## 2021-08-28 DIAGNOSIS — Z6828 Body mass index (BMI) 28.0-28.9, adult: Secondary | ICD-10-CM | POA: Diagnosis not present

## 2021-08-28 DIAGNOSIS — M8580 Other specified disorders of bone density and structure, unspecified site: Secondary | ICD-10-CM | POA: Diagnosis not present

## 2021-08-28 DIAGNOSIS — J9601 Acute respiratory failure with hypoxia: Secondary | ICD-10-CM | POA: Diagnosis not present

## 2021-08-28 DIAGNOSIS — R1310 Dysphagia, unspecified: Secondary | ICD-10-CM | POA: Diagnosis not present

## 2021-08-28 DIAGNOSIS — E785 Hyperlipidemia, unspecified: Secondary | ICD-10-CM

## 2021-08-28 DIAGNOSIS — Z7901 Long term (current) use of anticoagulants: Secondary | ICD-10-CM | POA: Diagnosis not present

## 2021-08-28 DIAGNOSIS — E559 Vitamin D deficiency, unspecified: Secondary | ICD-10-CM | POA: Diagnosis not present

## 2021-08-29 DIAGNOSIS — J9601 Acute respiratory failure with hypoxia: Secondary | ICD-10-CM | POA: Diagnosis not present

## 2021-08-29 DIAGNOSIS — Z85118 Personal history of other malignant neoplasm of bronchus and lung: Secondary | ICD-10-CM | POA: Diagnosis not present

## 2021-08-29 DIAGNOSIS — M8580 Other specified disorders of bone density and structure, unspecified site: Secondary | ICD-10-CM | POA: Diagnosis not present

## 2021-08-29 DIAGNOSIS — K219 Gastro-esophageal reflux disease without esophagitis: Secondary | ICD-10-CM | POA: Diagnosis not present

## 2021-08-29 DIAGNOSIS — E785 Hyperlipidemia, unspecified: Secondary | ICD-10-CM | POA: Diagnosis not present

## 2021-08-29 DIAGNOSIS — R1310 Dysphagia, unspecified: Secondary | ICD-10-CM | POA: Diagnosis not present

## 2021-08-29 DIAGNOSIS — Z6828 Body mass index (BMI) 28.0-28.9, adult: Secondary | ICD-10-CM | POA: Diagnosis not present

## 2021-08-29 DIAGNOSIS — Z9842 Cataract extraction status, left eye: Secondary | ICD-10-CM | POA: Diagnosis not present

## 2021-08-29 DIAGNOSIS — K5900 Constipation, unspecified: Secondary | ICD-10-CM | POA: Diagnosis not present

## 2021-08-29 DIAGNOSIS — I10 Essential (primary) hypertension: Secondary | ICD-10-CM | POA: Diagnosis not present

## 2021-08-29 DIAGNOSIS — I4891 Unspecified atrial fibrillation: Secondary | ICD-10-CM | POA: Diagnosis not present

## 2021-08-29 DIAGNOSIS — E119 Type 2 diabetes mellitus without complications: Secondary | ICD-10-CM | POA: Diagnosis not present

## 2021-08-29 DIAGNOSIS — Z7901 Long term (current) use of anticoagulants: Secondary | ICD-10-CM | POA: Diagnosis not present

## 2021-08-29 DIAGNOSIS — M1990 Unspecified osteoarthritis, unspecified site: Secondary | ICD-10-CM | POA: Diagnosis not present

## 2021-08-29 DIAGNOSIS — Z9181 History of falling: Secondary | ICD-10-CM | POA: Diagnosis not present

## 2021-08-29 DIAGNOSIS — R69 Illness, unspecified: Secondary | ICD-10-CM | POA: Diagnosis not present

## 2021-08-29 DIAGNOSIS — E559 Vitamin D deficiency, unspecified: Secondary | ICD-10-CM | POA: Diagnosis not present

## 2021-08-29 DIAGNOSIS — Z9841 Cataract extraction status, right eye: Secondary | ICD-10-CM | POA: Diagnosis not present

## 2021-08-29 DIAGNOSIS — K589 Irritable bowel syndrome without diarrhea: Secondary | ICD-10-CM | POA: Diagnosis not present

## 2021-08-29 DIAGNOSIS — E039 Hypothyroidism, unspecified: Secondary | ICD-10-CM | POA: Diagnosis not present

## 2021-08-29 DIAGNOSIS — E872 Acidosis, unspecified: Secondary | ICD-10-CM | POA: Diagnosis not present

## 2021-09-02 DIAGNOSIS — E785 Hyperlipidemia, unspecified: Secondary | ICD-10-CM | POA: Diagnosis not present

## 2021-09-02 DIAGNOSIS — J9601 Acute respiratory failure with hypoxia: Secondary | ICD-10-CM | POA: Diagnosis not present

## 2021-09-02 DIAGNOSIS — R1310 Dysphagia, unspecified: Secondary | ICD-10-CM | POA: Diagnosis not present

## 2021-09-02 DIAGNOSIS — I4891 Unspecified atrial fibrillation: Secondary | ICD-10-CM | POA: Diagnosis not present

## 2021-09-02 DIAGNOSIS — Z9841 Cataract extraction status, right eye: Secondary | ICD-10-CM | POA: Diagnosis not present

## 2021-09-02 DIAGNOSIS — R69 Illness, unspecified: Secondary | ICD-10-CM | POA: Diagnosis not present

## 2021-09-02 DIAGNOSIS — K219 Gastro-esophageal reflux disease without esophagitis: Secondary | ICD-10-CM | POA: Diagnosis not present

## 2021-09-02 DIAGNOSIS — K589 Irritable bowel syndrome without diarrhea: Secondary | ICD-10-CM | POA: Diagnosis not present

## 2021-09-02 DIAGNOSIS — E559 Vitamin D deficiency, unspecified: Secondary | ICD-10-CM | POA: Diagnosis not present

## 2021-09-02 DIAGNOSIS — M8580 Other specified disorders of bone density and structure, unspecified site: Secondary | ICD-10-CM | POA: Diagnosis not present

## 2021-09-02 DIAGNOSIS — E039 Hypothyroidism, unspecified: Secondary | ICD-10-CM | POA: Diagnosis not present

## 2021-09-02 DIAGNOSIS — M1990 Unspecified osteoarthritis, unspecified site: Secondary | ICD-10-CM | POA: Diagnosis not present

## 2021-09-02 DIAGNOSIS — Z85118 Personal history of other malignant neoplasm of bronchus and lung: Secondary | ICD-10-CM | POA: Diagnosis not present

## 2021-09-02 DIAGNOSIS — Z9181 History of falling: Secondary | ICD-10-CM | POA: Diagnosis not present

## 2021-09-02 DIAGNOSIS — Z9842 Cataract extraction status, left eye: Secondary | ICD-10-CM | POA: Diagnosis not present

## 2021-09-02 DIAGNOSIS — E119 Type 2 diabetes mellitus without complications: Secondary | ICD-10-CM | POA: Diagnosis not present

## 2021-09-02 DIAGNOSIS — K5900 Constipation, unspecified: Secondary | ICD-10-CM | POA: Diagnosis not present

## 2021-09-02 DIAGNOSIS — I10 Essential (primary) hypertension: Secondary | ICD-10-CM | POA: Diagnosis not present

## 2021-09-02 DIAGNOSIS — Z6828 Body mass index (BMI) 28.0-28.9, adult: Secondary | ICD-10-CM | POA: Diagnosis not present

## 2021-09-02 DIAGNOSIS — Z7901 Long term (current) use of anticoagulants: Secondary | ICD-10-CM | POA: Diagnosis not present

## 2021-09-02 DIAGNOSIS — E872 Acidosis, unspecified: Secondary | ICD-10-CM | POA: Diagnosis not present

## 2021-09-04 DIAGNOSIS — R1310 Dysphagia, unspecified: Secondary | ICD-10-CM | POA: Diagnosis not present

## 2021-09-04 DIAGNOSIS — I4891 Unspecified atrial fibrillation: Secondary | ICD-10-CM | POA: Diagnosis not present

## 2021-09-04 DIAGNOSIS — M8580 Other specified disorders of bone density and structure, unspecified site: Secondary | ICD-10-CM | POA: Diagnosis not present

## 2021-09-04 DIAGNOSIS — K219 Gastro-esophageal reflux disease without esophagitis: Secondary | ICD-10-CM | POA: Diagnosis not present

## 2021-09-04 DIAGNOSIS — Z9842 Cataract extraction status, left eye: Secondary | ICD-10-CM | POA: Diagnosis not present

## 2021-09-04 DIAGNOSIS — E872 Acidosis, unspecified: Secondary | ICD-10-CM | POA: Diagnosis not present

## 2021-09-04 DIAGNOSIS — E039 Hypothyroidism, unspecified: Secondary | ICD-10-CM | POA: Diagnosis not present

## 2021-09-04 DIAGNOSIS — M1990 Unspecified osteoarthritis, unspecified site: Secondary | ICD-10-CM | POA: Diagnosis not present

## 2021-09-04 DIAGNOSIS — K589 Irritable bowel syndrome without diarrhea: Secondary | ICD-10-CM | POA: Diagnosis not present

## 2021-09-04 DIAGNOSIS — Z9181 History of falling: Secondary | ICD-10-CM | POA: Diagnosis not present

## 2021-09-04 DIAGNOSIS — E119 Type 2 diabetes mellitus without complications: Secondary | ICD-10-CM | POA: Diagnosis not present

## 2021-09-04 DIAGNOSIS — E785 Hyperlipidemia, unspecified: Secondary | ICD-10-CM | POA: Diagnosis not present

## 2021-09-04 DIAGNOSIS — I10 Essential (primary) hypertension: Secondary | ICD-10-CM | POA: Diagnosis not present

## 2021-09-04 DIAGNOSIS — E559 Vitamin D deficiency, unspecified: Secondary | ICD-10-CM | POA: Diagnosis not present

## 2021-09-04 DIAGNOSIS — J9601 Acute respiratory failure with hypoxia: Secondary | ICD-10-CM | POA: Diagnosis not present

## 2021-09-04 DIAGNOSIS — R69 Illness, unspecified: Secondary | ICD-10-CM | POA: Diagnosis not present

## 2021-09-04 DIAGNOSIS — K5900 Constipation, unspecified: Secondary | ICD-10-CM | POA: Diagnosis not present

## 2021-09-04 DIAGNOSIS — Z85118 Personal history of other malignant neoplasm of bronchus and lung: Secondary | ICD-10-CM | POA: Diagnosis not present

## 2021-09-04 DIAGNOSIS — Z6828 Body mass index (BMI) 28.0-28.9, adult: Secondary | ICD-10-CM | POA: Diagnosis not present

## 2021-09-04 DIAGNOSIS — Z9841 Cataract extraction status, right eye: Secondary | ICD-10-CM | POA: Diagnosis not present

## 2021-09-04 DIAGNOSIS — Z7901 Long term (current) use of anticoagulants: Secondary | ICD-10-CM | POA: Diagnosis not present

## 2021-09-07 ENCOUNTER — Other Ambulatory Visit: Payer: Self-pay | Admitting: Nurse Practitioner

## 2021-09-08 ENCOUNTER — Other Ambulatory Visit: Payer: Self-pay | Admitting: Nurse Practitioner

## 2021-09-08 DIAGNOSIS — I4891 Unspecified atrial fibrillation: Secondary | ICD-10-CM

## 2021-09-10 DIAGNOSIS — E039 Hypothyroidism, unspecified: Secondary | ICD-10-CM | POA: Diagnosis not present

## 2021-09-10 DIAGNOSIS — Z9841 Cataract extraction status, right eye: Secondary | ICD-10-CM | POA: Diagnosis not present

## 2021-09-10 DIAGNOSIS — E872 Acidosis, unspecified: Secondary | ICD-10-CM | POA: Diagnosis not present

## 2021-09-10 DIAGNOSIS — R69 Illness, unspecified: Secondary | ICD-10-CM | POA: Diagnosis not present

## 2021-09-10 DIAGNOSIS — I10 Essential (primary) hypertension: Secondary | ICD-10-CM | POA: Diagnosis not present

## 2021-09-10 DIAGNOSIS — J9601 Acute respiratory failure with hypoxia: Secondary | ICD-10-CM | POA: Diagnosis not present

## 2021-09-10 DIAGNOSIS — R1310 Dysphagia, unspecified: Secondary | ICD-10-CM | POA: Diagnosis not present

## 2021-09-10 DIAGNOSIS — K219 Gastro-esophageal reflux disease without esophagitis: Secondary | ICD-10-CM | POA: Diagnosis not present

## 2021-09-10 DIAGNOSIS — Z6828 Body mass index (BMI) 28.0-28.9, adult: Secondary | ICD-10-CM | POA: Diagnosis not present

## 2021-09-10 DIAGNOSIS — M1990 Unspecified osteoarthritis, unspecified site: Secondary | ICD-10-CM | POA: Diagnosis not present

## 2021-09-10 DIAGNOSIS — Z9181 History of falling: Secondary | ICD-10-CM | POA: Diagnosis not present

## 2021-09-10 DIAGNOSIS — I4891 Unspecified atrial fibrillation: Secondary | ICD-10-CM | POA: Diagnosis not present

## 2021-09-10 DIAGNOSIS — K5900 Constipation, unspecified: Secondary | ICD-10-CM | POA: Diagnosis not present

## 2021-09-10 DIAGNOSIS — E119 Type 2 diabetes mellitus without complications: Secondary | ICD-10-CM | POA: Diagnosis not present

## 2021-09-10 DIAGNOSIS — Z85118 Personal history of other malignant neoplasm of bronchus and lung: Secondary | ICD-10-CM | POA: Diagnosis not present

## 2021-09-10 DIAGNOSIS — Z7901 Long term (current) use of anticoagulants: Secondary | ICD-10-CM | POA: Diagnosis not present

## 2021-09-10 DIAGNOSIS — E559 Vitamin D deficiency, unspecified: Secondary | ICD-10-CM | POA: Diagnosis not present

## 2021-09-10 DIAGNOSIS — K589 Irritable bowel syndrome without diarrhea: Secondary | ICD-10-CM | POA: Diagnosis not present

## 2021-09-10 DIAGNOSIS — E785 Hyperlipidemia, unspecified: Secondary | ICD-10-CM | POA: Diagnosis not present

## 2021-09-10 DIAGNOSIS — Z9842 Cataract extraction status, left eye: Secondary | ICD-10-CM | POA: Diagnosis not present

## 2021-09-10 DIAGNOSIS — M8580 Other specified disorders of bone density and structure, unspecified site: Secondary | ICD-10-CM | POA: Diagnosis not present

## 2021-09-11 DIAGNOSIS — K219 Gastro-esophageal reflux disease without esophagitis: Secondary | ICD-10-CM | POA: Diagnosis not present

## 2021-09-11 DIAGNOSIS — I10 Essential (primary) hypertension: Secondary | ICD-10-CM | POA: Diagnosis not present

## 2021-09-11 DIAGNOSIS — Z9841 Cataract extraction status, right eye: Secondary | ICD-10-CM | POA: Diagnosis not present

## 2021-09-11 DIAGNOSIS — M8580 Other specified disorders of bone density and structure, unspecified site: Secondary | ICD-10-CM | POA: Diagnosis not present

## 2021-09-11 DIAGNOSIS — R69 Illness, unspecified: Secondary | ICD-10-CM | POA: Diagnosis not present

## 2021-09-11 DIAGNOSIS — E119 Type 2 diabetes mellitus without complications: Secondary | ICD-10-CM | POA: Diagnosis not present

## 2021-09-11 DIAGNOSIS — Z7901 Long term (current) use of anticoagulants: Secondary | ICD-10-CM | POA: Diagnosis not present

## 2021-09-11 DIAGNOSIS — E872 Acidosis, unspecified: Secondary | ICD-10-CM | POA: Diagnosis not present

## 2021-09-11 DIAGNOSIS — E039 Hypothyroidism, unspecified: Secondary | ICD-10-CM | POA: Diagnosis not present

## 2021-09-11 DIAGNOSIS — E559 Vitamin D deficiency, unspecified: Secondary | ICD-10-CM | POA: Diagnosis not present

## 2021-09-11 DIAGNOSIS — Z6828 Body mass index (BMI) 28.0-28.9, adult: Secondary | ICD-10-CM | POA: Diagnosis not present

## 2021-09-11 DIAGNOSIS — I4891 Unspecified atrial fibrillation: Secondary | ICD-10-CM | POA: Diagnosis not present

## 2021-09-11 DIAGNOSIS — J9601 Acute respiratory failure with hypoxia: Secondary | ICD-10-CM | POA: Diagnosis not present

## 2021-09-11 DIAGNOSIS — E785 Hyperlipidemia, unspecified: Secondary | ICD-10-CM | POA: Diagnosis not present

## 2021-09-11 DIAGNOSIS — M1990 Unspecified osteoarthritis, unspecified site: Secondary | ICD-10-CM | POA: Diagnosis not present

## 2021-09-11 DIAGNOSIS — R1310 Dysphagia, unspecified: Secondary | ICD-10-CM | POA: Diagnosis not present

## 2021-09-11 DIAGNOSIS — Z85118 Personal history of other malignant neoplasm of bronchus and lung: Secondary | ICD-10-CM | POA: Diagnosis not present

## 2021-09-11 DIAGNOSIS — Z9842 Cataract extraction status, left eye: Secondary | ICD-10-CM | POA: Diagnosis not present

## 2021-09-11 DIAGNOSIS — K589 Irritable bowel syndrome without diarrhea: Secondary | ICD-10-CM | POA: Diagnosis not present

## 2021-09-11 DIAGNOSIS — K5900 Constipation, unspecified: Secondary | ICD-10-CM | POA: Diagnosis not present

## 2021-09-11 DIAGNOSIS — Z9181 History of falling: Secondary | ICD-10-CM | POA: Diagnosis not present

## 2021-09-12 DIAGNOSIS — I10 Essential (primary) hypertension: Secondary | ICD-10-CM | POA: Diagnosis not present

## 2021-09-12 DIAGNOSIS — E559 Vitamin D deficiency, unspecified: Secondary | ICD-10-CM | POA: Diagnosis not present

## 2021-09-12 DIAGNOSIS — K5900 Constipation, unspecified: Secondary | ICD-10-CM | POA: Diagnosis not present

## 2021-09-12 DIAGNOSIS — R1310 Dysphagia, unspecified: Secondary | ICD-10-CM | POA: Diagnosis not present

## 2021-09-12 DIAGNOSIS — Z85118 Personal history of other malignant neoplasm of bronchus and lung: Secondary | ICD-10-CM | POA: Diagnosis not present

## 2021-09-12 DIAGNOSIS — M1990 Unspecified osteoarthritis, unspecified site: Secondary | ICD-10-CM | POA: Diagnosis not present

## 2021-09-12 DIAGNOSIS — J9601 Acute respiratory failure with hypoxia: Secondary | ICD-10-CM | POA: Diagnosis not present

## 2021-09-12 DIAGNOSIS — E119 Type 2 diabetes mellitus without complications: Secondary | ICD-10-CM | POA: Diagnosis not present

## 2021-09-12 DIAGNOSIS — E872 Acidosis, unspecified: Secondary | ICD-10-CM | POA: Diagnosis not present

## 2021-09-12 DIAGNOSIS — E039 Hypothyroidism, unspecified: Secondary | ICD-10-CM | POA: Diagnosis not present

## 2021-09-12 DIAGNOSIS — Z7901 Long term (current) use of anticoagulants: Secondary | ICD-10-CM | POA: Diagnosis not present

## 2021-09-12 DIAGNOSIS — R69 Illness, unspecified: Secondary | ICD-10-CM | POA: Diagnosis not present

## 2021-09-12 DIAGNOSIS — Z6828 Body mass index (BMI) 28.0-28.9, adult: Secondary | ICD-10-CM | POA: Diagnosis not present

## 2021-09-12 DIAGNOSIS — K219 Gastro-esophageal reflux disease without esophagitis: Secondary | ICD-10-CM | POA: Diagnosis not present

## 2021-09-12 DIAGNOSIS — E785 Hyperlipidemia, unspecified: Secondary | ICD-10-CM | POA: Diagnosis not present

## 2021-09-12 DIAGNOSIS — Z9181 History of falling: Secondary | ICD-10-CM | POA: Diagnosis not present

## 2021-09-12 DIAGNOSIS — K589 Irritable bowel syndrome without diarrhea: Secondary | ICD-10-CM | POA: Diagnosis not present

## 2021-09-12 DIAGNOSIS — I4891 Unspecified atrial fibrillation: Secondary | ICD-10-CM | POA: Diagnosis not present

## 2021-09-12 DIAGNOSIS — M8580 Other specified disorders of bone density and structure, unspecified site: Secondary | ICD-10-CM | POA: Diagnosis not present

## 2021-09-12 DIAGNOSIS — Z9842 Cataract extraction status, left eye: Secondary | ICD-10-CM | POA: Diagnosis not present

## 2021-09-12 DIAGNOSIS — Z9841 Cataract extraction status, right eye: Secondary | ICD-10-CM | POA: Diagnosis not present

## 2021-09-13 ENCOUNTER — Other Ambulatory Visit: Payer: Self-pay | Admitting: Nurse Practitioner

## 2021-09-13 DIAGNOSIS — I4891 Unspecified atrial fibrillation: Secondary | ICD-10-CM

## 2021-09-17 DIAGNOSIS — Z9181 History of falling: Secondary | ICD-10-CM | POA: Diagnosis not present

## 2021-09-17 DIAGNOSIS — M8580 Other specified disorders of bone density and structure, unspecified site: Secondary | ICD-10-CM | POA: Diagnosis not present

## 2021-09-17 DIAGNOSIS — M1990 Unspecified osteoarthritis, unspecified site: Secondary | ICD-10-CM | POA: Diagnosis not present

## 2021-09-17 DIAGNOSIS — I10 Essential (primary) hypertension: Secondary | ICD-10-CM | POA: Diagnosis not present

## 2021-09-17 DIAGNOSIS — K5900 Constipation, unspecified: Secondary | ICD-10-CM | POA: Diagnosis not present

## 2021-09-17 DIAGNOSIS — J9601 Acute respiratory failure with hypoxia: Secondary | ICD-10-CM | POA: Diagnosis not present

## 2021-09-17 DIAGNOSIS — K219 Gastro-esophageal reflux disease without esophagitis: Secondary | ICD-10-CM | POA: Diagnosis not present

## 2021-09-17 DIAGNOSIS — Z6828 Body mass index (BMI) 28.0-28.9, adult: Secondary | ICD-10-CM | POA: Diagnosis not present

## 2021-09-17 DIAGNOSIS — E559 Vitamin D deficiency, unspecified: Secondary | ICD-10-CM | POA: Diagnosis not present

## 2021-09-17 DIAGNOSIS — Z9841 Cataract extraction status, right eye: Secondary | ICD-10-CM | POA: Diagnosis not present

## 2021-09-17 DIAGNOSIS — E785 Hyperlipidemia, unspecified: Secondary | ICD-10-CM | POA: Diagnosis not present

## 2021-09-17 DIAGNOSIS — Z7901 Long term (current) use of anticoagulants: Secondary | ICD-10-CM | POA: Diagnosis not present

## 2021-09-17 DIAGNOSIS — E039 Hypothyroidism, unspecified: Secondary | ICD-10-CM | POA: Diagnosis not present

## 2021-09-17 DIAGNOSIS — R1310 Dysphagia, unspecified: Secondary | ICD-10-CM | POA: Diagnosis not present

## 2021-09-17 DIAGNOSIS — R69 Illness, unspecified: Secondary | ICD-10-CM | POA: Diagnosis not present

## 2021-09-17 DIAGNOSIS — Z85118 Personal history of other malignant neoplasm of bronchus and lung: Secondary | ICD-10-CM | POA: Diagnosis not present

## 2021-09-17 DIAGNOSIS — Z9842 Cataract extraction status, left eye: Secondary | ICD-10-CM | POA: Diagnosis not present

## 2021-09-17 DIAGNOSIS — E119 Type 2 diabetes mellitus without complications: Secondary | ICD-10-CM | POA: Diagnosis not present

## 2021-09-17 DIAGNOSIS — K589 Irritable bowel syndrome without diarrhea: Secondary | ICD-10-CM | POA: Diagnosis not present

## 2021-09-17 DIAGNOSIS — E872 Acidosis, unspecified: Secondary | ICD-10-CM | POA: Diagnosis not present

## 2021-09-17 DIAGNOSIS — I4891 Unspecified atrial fibrillation: Secondary | ICD-10-CM | POA: Diagnosis not present

## 2021-09-18 ENCOUNTER — Ambulatory Visit (INDEPENDENT_AMBULATORY_CARE_PROVIDER_SITE_OTHER): Payer: Medicare HMO

## 2021-09-18 ENCOUNTER — Telehealth: Payer: Self-pay | Admitting: Nurse Practitioner

## 2021-09-18 VITALS — Ht 63.0 in | Wt 155.0 lb

## 2021-09-18 DIAGNOSIS — M1990 Unspecified osteoarthritis, unspecified site: Secondary | ICD-10-CM | POA: Diagnosis not present

## 2021-09-18 DIAGNOSIS — E559 Vitamin D deficiency, unspecified: Secondary | ICD-10-CM | POA: Diagnosis not present

## 2021-09-18 DIAGNOSIS — Z85118 Personal history of other malignant neoplasm of bronchus and lung: Secondary | ICD-10-CM | POA: Diagnosis not present

## 2021-09-18 DIAGNOSIS — E872 Acidosis, unspecified: Secondary | ICD-10-CM | POA: Diagnosis not present

## 2021-09-18 DIAGNOSIS — K589 Irritable bowel syndrome without diarrhea: Secondary | ICD-10-CM | POA: Diagnosis not present

## 2021-09-18 DIAGNOSIS — Z9841 Cataract extraction status, right eye: Secondary | ICD-10-CM | POA: Diagnosis not present

## 2021-09-18 DIAGNOSIS — Z0000 Encounter for general adult medical examination without abnormal findings: Secondary | ICD-10-CM

## 2021-09-18 DIAGNOSIS — M8580 Other specified disorders of bone density and structure, unspecified site: Secondary | ICD-10-CM | POA: Diagnosis not present

## 2021-09-18 DIAGNOSIS — K5900 Constipation, unspecified: Secondary | ICD-10-CM | POA: Diagnosis not present

## 2021-09-18 DIAGNOSIS — E039 Hypothyroidism, unspecified: Secondary | ICD-10-CM | POA: Diagnosis not present

## 2021-09-18 DIAGNOSIS — Z9181 History of falling: Secondary | ICD-10-CM | POA: Diagnosis not present

## 2021-09-18 DIAGNOSIS — Z7901 Long term (current) use of anticoagulants: Secondary | ICD-10-CM | POA: Diagnosis not present

## 2021-09-18 DIAGNOSIS — Z9842 Cataract extraction status, left eye: Secondary | ICD-10-CM | POA: Diagnosis not present

## 2021-09-18 DIAGNOSIS — I4891 Unspecified atrial fibrillation: Secondary | ICD-10-CM | POA: Diagnosis not present

## 2021-09-18 DIAGNOSIS — R1310 Dysphagia, unspecified: Secondary | ICD-10-CM | POA: Diagnosis not present

## 2021-09-18 DIAGNOSIS — J9601 Acute respiratory failure with hypoxia: Secondary | ICD-10-CM | POA: Diagnosis not present

## 2021-09-18 DIAGNOSIS — K219 Gastro-esophageal reflux disease without esophagitis: Secondary | ICD-10-CM | POA: Diagnosis not present

## 2021-09-18 DIAGNOSIS — R69 Illness, unspecified: Secondary | ICD-10-CM | POA: Diagnosis not present

## 2021-09-18 DIAGNOSIS — E119 Type 2 diabetes mellitus without complications: Secondary | ICD-10-CM | POA: Diagnosis not present

## 2021-09-18 DIAGNOSIS — Z6828 Body mass index (BMI) 28.0-28.9, adult: Secondary | ICD-10-CM | POA: Diagnosis not present

## 2021-09-18 DIAGNOSIS — E785 Hyperlipidemia, unspecified: Secondary | ICD-10-CM | POA: Diagnosis not present

## 2021-09-18 DIAGNOSIS — I10 Essential (primary) hypertension: Secondary | ICD-10-CM | POA: Diagnosis not present

## 2021-09-18 NOTE — Telephone Encounter (Signed)
[facility] calling about getting an order for hospital bed. Patient had another fall on 7/29. Please call back.

## 2021-09-18 NOTE — Patient Instructions (Signed)
Veronica Paul , Thank you for taking time to come for your Medicare Wellness Visit. I appreciate your ongoing commitment to your health goals. Please review the following plan we discussed and let me know if I can assist you in the future.   Screening recommendations/referrals: Colonoscopy: aged out Mammogram: aged out Bone Density: 07/17/17 Recommended yearly ophthalmology/optometry visit for glaucoma screening and checkup Recommended yearly dental visit for hygiene and checkup  Vaccinations: Influenza vaccine: 11/12/20 Pneumococcal vaccine: 05/23/13 Tdap vaccine: 11/04/10, due if have injury Shingles vaccine: Zostavax 08/13/11   Covid-19:03/12/19, 04/11/19, 01/07/20  Advanced directives: no  Conditions/risks identified: none  Next appointment: Follow up in one year for your annual wellness visit 09/22/22 @ 9 am by phone   Preventive Care 65 Years and Older, Female Preventive care refers to lifestyle choices and visits with your health care provider that can promote health and wellness. What does preventive care include? A yearly physical exam. This is also called an annual well check. Dental exams once or twice a year. Routine eye exams. Ask your health care provider how often you should have your eyes checked. Personal lifestyle choices, including: Daily care of your teeth and gums. Regular physical activity. Eating a healthy diet. Avoiding tobacco and drug use. Limiting alcohol use. Practicing safe sex. Taking low-dose aspirin every day. Taking vitamin and mineral supplements as recommended by your health care provider. What happens during an annual well check? The services and screenings done by your health care provider during your annual well check will depend on your age, overall health, lifestyle risk factors, and family history of disease. Counseling  Your health care provider may ask you questions about your: Alcohol use. Tobacco use. Drug use. Emotional well-being. Home  and relationship well-being. Sexual activity. Eating habits. History of falls. Memory and ability to understand (cognition). Work and work Statistician. Reproductive health. Screening  You may have the following tests or measurements: Height, weight, and BMI. Blood pressure. Lipid and cholesterol levels. These may be checked every 5 years, or more frequently if you are over 84 years old. Skin check. Lung cancer screening. You may have this screening every year starting at age 84 if you have a 30-pack-year history of smoking and currently smoke or have quit within the past 15 years. Fecal occult blood test (FOBT) of the stool. You may have this test every year starting at age 84. Flexible sigmoidoscopy or colonoscopy. You may have a sigmoidoscopy every 5 years or a colonoscopy every 10 years starting at age 84. Hepatitis C blood test. Hepatitis B blood test. Sexually transmitted disease (STD) testing. Diabetes screening. This is done by checking your blood sugar (glucose) after you have not eaten for a while (fasting). You may have this done every 1-3 years. Bone density scan. This is done to screen for osteoporosis. You may have this done starting at age 84. Mammogram. This may be done every 1-2 years. Talk to your health care provider about how often you should have regular mammograms. Talk with your health care provider about your test results, treatment options, and if necessary, the need for more tests. Vaccines  Your health care provider may recommend certain vaccines, such as: Influenza vaccine. This is recommended every year. Tetanus, diphtheria, and acellular pertussis (Tdap, Td) vaccine. You may need a Td booster every 10 years. Zoster vaccine. You may need this after age 25. Pneumococcal 13-valent conjugate (PCV13) vaccine. One dose is recommended after age 84. Pneumococcal polysaccharide (PPSV23) vaccine. One dose is recommended after  age 84. Talk to your health care provider  about which screenings and vaccines you need and how often you need them. This information is not intended to replace advice given to you by your health care provider. Make sure you discuss any questions you have with your health care provider. Document Released: 03/02/2015 Document Revised: 10/24/2015 Document Reviewed: 12/05/2014 Elsevier Interactive Patient Education  2017 [place] Prevention in the Home Falls can cause injuries. They can happen to people of all ages. There are many things you can do to make your home safe and to help prevent falls. What can I do on the outside of my home? Regularly fix the edges of walkways and driveways and fix any cracks. Remove anything that might make you trip as you walk through a door, such as a raised step or threshold. Trim any bushes or trees on the path to your home. Use bright outdoor lighting. Clear any walking paths of anything that might make someone trip, such as rocks or tools. Regularly check to see if handrails are loose or broken. Make sure that both sides of any steps have handrails. Any raised decks and porches should have guardrails on the edges. Have any leaves, snow, or ice cleared regularly. Use sand or salt on walking paths during winter. Clean up any spills in your garage right away. This includes oil or grease spills. What can I do in the bathroom? Use night lights. Install grab bars by the toilet and in the tub and shower. Do not use towel bars as grab bars. Use non-skid mats or decals in the tub or shower. If you need to sit down in the shower, use a plastic, non-slip stool. Keep the floor dry. Clean up any water that spills on the floor as soon as it happens. Remove soap buildup in the tub or shower regularly. Attach bath mats securely with double-sided non-slip rug tape. Do not have throw rugs and other things on the floor that can make you trip. What can I do in the bedroom? Use night lights. Make sure  that you have a light by your bed that is easy to reach. Do not use any sheets or blankets that are too big for your bed. They should not hang down onto the floor. Have a firm chair that has side arms. You can use this for support while you get dressed. Do not have throw rugs and other things on the floor that can make you trip. What can I do in the kitchen? Clean up any spills right away. Avoid walking on wet floors. Keep items that you use a lot in easy-to-reach places. If you need to reach something above you, use a strong step stool that has a grab bar. Keep electrical cords out of the way. Do not use floor polish or wax that makes floors slippery. If you must use wax, use non-skid floor wax. Do not have throw rugs and other things on the floor that can make you trip. What can I do with my stairs? Do not leave any items on the stairs. Make sure that there are handrails on both sides of the stairs and use them. Fix handrails that are broken or loose. Make sure that handrails are as long as the stairways. Check any carpeting to make sure that it is firmly attached to the stairs. Fix any carpet that is loose or worn. Avoid having throw rugs at the top or bottom of the stairs. If you do  have throw rugs, attach them to the floor with carpet tape. Make sure that you have a light switch at the top of the stairs and the bottom of the stairs. If you do not have them, ask someone to add them for you. What else can I do to help prevent falls? Wear shoes that: Do not have high heels. Have rubber bottoms. Are comfortable and fit you well. Are closed at the toe. Do not wear sandals. If you use a stepladder: Make sure that it is fully opened. Do not climb a closed stepladder. Make sure that both sides of the stepladder are locked into place. Ask someone to hold it for you, if possible. Clearly mark and make sure that you can see: Any grab bars or handrails. First and last steps. Where the edge of  each step is. Use tools that help you move around (mobility aids) if they are needed. These include: Canes. Walkers. Scooters. Crutches. Turn on the lights when you go into a dark area. Replace any light bulbs as soon as they burn out. Set up your furniture so you have a clear path. Avoid moving your furniture around. If any of your floors are uneven, fix them. If there are any pets around you, be aware of where they are. Review your medicines with your doctor. Some medicines can make you feel dizzy. This can increase your chance of falling. Ask your doctor what other things that you can do to help prevent falls. This information is not intended to replace advice given to you by your health care provider. Make sure you discuss any questions you have with your health care provider. Document Released: 11/30/2008 Document Revised: 07/12/2015 Document Reviewed: 03/10/2014 Elsevier Interactive Patient Education  2017 [place]. 11/12/20

## 2021-09-18 NOTE — Progress Notes (Signed)
Virtual Visit via Telephone Note  I connected with  Veronica Paul on 09/18/21 at  8:15 AM EDT by telephone and verified that I am speaking with the correct person using two identifiers. Also spoke w/ daughter, [name]  Location: Patient: home Provider: [facility] Persons participating in the virtual visit: patient/Nurse Health Advisor   I discussed the limitations, risks, security and privacy concerns of performing an evaluation and management service by telephone and the availability of in person appointments. The patient expressed understanding and agreed to proceed.  Interactive audio and video telecommunications were attempted between this nurse and patient, however failed, due to patient having technical difficulties OR patient did not have access to video capability.  We continued and completed visit with audio only.  Some vital signs may be absent or patient reported.   Dionisio David, LPN  Subjective:   Veronica Paul is a 86 y.o. female who presents for Medicare Annual (Subsequent) preventive examination.  Review of Systems           Objective:    There were no vitals filed for this visit. There is no height or weight on file to calculate BMI.     08/27/2021    4:23 AM 08/07/2021    2:33 PM 02/21/2021    5:04 PM 09/17/2020    9:30 AM 09/15/2019    9:40 AM 05/05/2019    1:58 PM 11/08/2018   12:58 PM  Advanced Directives  Does Patient Have a Medical Advance Directive? No No;Yes Yes Yes Yes No No  Type of Advance Directive  Living will  Healthcare Power of [place]    Does patient want to make changes to medical advance directive?  No - Patient declined   No - Patient declined    Copy of [place] in Chart?    No - copy requested No - copy requested    Would patient like information on creating a medical advance directive?      No - Patient declined     Current Medications (verified) Outpatient Encounter Medications  as of 09/18/2021  Medication Sig   acetaminophen (TYLENOL) 500 MG tablet Take 500 mg by mouth every 6 (six) hours as needed for moderate pain, headache or fever.   bisacodyl (DULCOLAX) 5 MG EC tablet Take 5 mg by mouth daily as needed for moderate constipation (usually twice per week).   Blood Glucose Monitoring Suppl (ONETOUCH VERIO) w/Device KIT Check blood sugars daily Dx E11.9   [facility] MELATONIN 10 MG CAPS TAKE 1 CAPSULE BY MOUTH EVERY DAY AT BEDTIME AS NEEDED   diltiazem (CARDIZEM) 90 MG tablet TAKE 1 TABLET (90 MG TOTAL) BY MOUTH 3 (THREE) TIMES DAILY.   ELIQUIS 5 MG TABS tablet TAKE 1 TABLET BY MOUTH TWICE A DAY   feeding supplement (ENSURE ENLIVE / ENSURE PLUS) LIQD Take 237 mLs by mouth 3 (three) times daily between meals.   furosemide (LASIX) 20 MG tablet Take 1 tablet (20 mg total) by mouth daily.   glucose blood (ONETOUCH VERIO) test strip CHECK BLOOD SUGARS DAILY E11.9   Lancet Devices (ONETOUCH DELICA PLUS LANCING) MISC 1 Device by Does not apply route daily.   Lancets (ONETOUCH DELICA PLUS NPYYFR10Y) MISC USE TO CHECK BLOOD SUGARS DAILY DX E11.9   levothyroxine (SYNTHROID) 88 MCG tablet Take 1 tablet (88 mcg total) by mouth daily.   metoprolol tartrate (LOPRESSOR) 100 MG tablet TAKE 1.5 TABLETS BY MOUTH 2 TIMES DAILY.  mupirocin ointment (BACTROBAN) 2 % Apply topically 2 (two) times daily.   nitroGLYCERIN (NITROSTAT) 0.4 MG SL tablet Place 1 tablet (0.4 mg total) under the tongue every 5 (five) minutes as needed for chest pain.   nystatin (MYCOSTATIN/NYSTOP) powder Apply 1 application. topically 3 (three) times daily.   omeprazole (PRILOSEC) 20 MG capsule Take 1 capsule (20 mg total) by mouth daily.   polyethylene glycol (MIRALAX / GLYCOLAX) 17 g packet Take 17 g by mouth daily as needed for mild constipation.   promethazine-dextromethorphan (PROMETHAZINE-DM) 6.25-15 MG/5ML syrup Take 5 mLs by mouth 4 (four) times daily as needed for cough.   sertraline (ZOLOFT) 50 MG tablet Take 1  tablet (50 mg total) by mouth daily.   ondansetron (ZOFRAN-ODT) 4 MG disintegrating tablet Take 1 tablet (4 mg total) by mouth every 8 (eight) hours as needed for nausea or vomiting. (Patient not taking: Reported on 09/18/2021)   No facility-administered encounter medications on file as of 09/18/2021.    Allergies (verified) Ace inhibitors, Feldene [piroxicam], Lexapro [escitalopram], Meloxicam, Prevnar [pneumococcal 13-val conj vacc], Statins, Sulfa antibiotics, and Zithromax [azithromycin dihydrate]   History: Past Medical History:  Diagnosis Date   Allergy    "my nose runs alot"   Anxiety    Arthritis    Atrial fibrillation ([place])    Blood transfusion without reported diagnosis    "when I had a miscarrage in 1957"   Cataract    bilateral removed   Cholelithiasis    Constipation    x ray showed increased stool in colon- uses Miralax daily and Dulcolax twice a week    Depression    Diabetes mellitus    Dyslipidemia    HTN (hypertension)    Hx of long-term (current) use of anticoagulants    Hyperlipidemia    Hypertension    Hypothyroidism    Lung cancer ([place])    Obesity    Osteopenia    Vitamin D deficiency    Past Surgical History:  Procedure Laterality Date   CATARACT EXTRACTION Bilateral    COLONOSCOPY     COLONOSCOPY WITH PROPOFOL N/A 05/06/2019   Procedure: COLONOSCOPY WITH PROPOFOL;  Surgeon: Jerene Bears, MD;  Location: [facility];  Service: Gastroenterology;  Laterality: N/A;   ESOPHAGOGASTRODUODENOSCOPY (EGD) WITH PROPOFOL N/A 08/11/2021   Procedure: ESOPHAGOGASTRODUODENOSCOPY (EGD) WITH PROPOFOL;  Surgeon: Harvel Quale, MD;  Location: [facility];  Service: Gastroenterology;  Laterality: N/A;   LUNG REMOVAL, PARTIAL  1998   Right   POLYPECTOMY  05/06/2019   Procedure: POLYPECTOMY;  Surgeon: Jerene Bears, MD;  Location: [facility];  Service: Gastroenterology;;   Azzie Almas DILATION  08/11/2021   Procedure: Azzie Almas DILATION;  Surgeon: Montez Morita, Quillian Quince, MD;  Location: [facility];  Service: Gastroenterology;;   THYROIDECTOMY     TUBAL LIGATION     UPPER GASTROINTESTINAL ENDOSCOPY     Family History  Problem Relation Age of Onset   Coronary artery disease Brother 50   Hypertension Brother    Heart disease Brother    Hypertension Mother    Hypertension Father    Heart disease Father    Hypertension Sister    Heart disease Sister    Cancer Sister    Arthritis Daughter    COPD Daughter    Fibromyalgia Daughter    Ulcerative colitis Son    Stroke Maternal Grandmother    Cancer Brother    Lung cancer Brother    Hypertension Sister    Cancer Sister  Colon cancer Neg Hx    Food intolerance Neg Hx    Esophageal cancer Neg Hx    Rectal cancer Neg Hx    Stomach cancer Neg Hx    Colon polyps Neg Hx    Social History   Socioeconomic History   Marital status: Widowed    Spouse name: Not on file   Number of children: 4   Years of education: 42   Highest education level: 12th grade  Occupational History   Occupation: Retired    Comment: Conservation officer, historic buildings  Tobacco Use   Smoking status: Never   Smokeless tobacco: Never   Tobacco comments:    Never smoke 05/15/21  Vaping Use   Vaping Use: Never used  Substance and Sexual Activity   Alcohol use: No   Drug use: No   Sexual activity: Not Currently  Other Topics Concern   Not on file  Social History Narrative   Lives alone - one level   Social Determinants of Health   Financial Resource Strain: Low Risk  (09/17/2020)   Overall Financial Resource Strain (CARDIA)    Difficulty of Paying Living Expenses: Not very hard  Food Insecurity: No Food Insecurity (09/17/2020)   Hunger Vital Sign    Worried About Running Out of Food in the Last Year: Never true    Ran Out of Food in the Last Year: Never true  Transportation Needs: No Transportation Needs (09/17/2020)   PRAPARE - Hydrologist (Medical): No    Lack of Transportation  (Non-Medical): No  Physical Activity: Insufficiently Active (09/18/2021)   Exercise Vital Sign    Days of Exercise per Week: 4 days    Minutes of Exercise per Session: 30 min  Stress: No Stress Concern Present (09/18/2021)   [place]    Feeling of Stress : Only a little  Social Connections: Moderately Isolated (09/17/2020)   Social Connection and Isolation Panel [NHANES]    Frequency of Communication with Friends and Family: More than three times a week    Frequency of Social Gatherings with Friends and Family: More than three times a week    Attends Religious Services: 1 to 4 times per year    Active Member of [employer] or Organizations: No    Attends Archivist Meetings: Never    Marital Status: Widowed    Tobacco Counseling Counseling given: Not Answered Tobacco comments: Never smoke 05/15/21   Clinical Intake:  Pre-visit preparation completed: Yes  Pain : No/denies pain     Nutritional Risks: None Diabetes: Yes CBG done?: No Did pt. bring in CBG monitor from home?: No  How often do you need to have someone help you when you read instructions, pamphlets, or other written materials from your doctor or pharmacy?: 1 - Never  Diabetic?yes Nutrition Risk Assessment:  Has the patient had any N/V/D within the last 2 months?  No  Does the patient have any non-healing wounds?  No  Has the patient had any unintentional weight loss or weight gain?  No   Diabetes:  Is the patient diabetic?  Yes  If diabetic, was a CBG obtained today?  No  Did the patient bring in their glucometer from home?  No  How often do you monitor your CBG's? Every day.   Financial Strains and Diabetes Management:  Are you having any financial strains with the device, your supplies or your medication? No .  Does the  patient want to be seen by Chronic Care Management for management of their diabetes?  No  Would the patient like to  be referred to a Nutritionist or for Diabetic Management?  No   Diabetic Exams:  Diabetic Eye Exam: Completed had [facility] to [facility] in [facility]. Marland Kitchen Pt has been advised about the importance in completing this exam.   Diabetic Foot Exam: Completed 08/12/19. Pt has been advised about the importance in completing this exam.   Interpreter Needed?: No  Information entered by :: Kirke Shaggy, LPN   Activities of Daily Living    08/07/2021    5:26 PM 08/07/2021    5:23 PM  In your present state of health, do you have any difficulty performing the following activities:  Hearing?  0  Vision?  0  Difficulty concentrating or making decisions?  1  Walking or climbing stairs?  0  Dressing or bathing?  1  Doing errands, shopping? 1     Patient Care Team: Chevis Pretty, FNP as PCP - General (Family Medicine) Minus Breeding, MD as PCP - Cardiology (Cardiology) Minus Breeding, MD as Consulting Physician (Cardiology) Harlen Labs, MD as Referring Physician (Optometry) Franchot Gallo, MD as Consulting Physician (Urology) Sandford Craze, MD as Referring Physician (Dermatology) Loletha Carrow Kirke Corin, MD as Consulting Physician (Gastroenterology)  Indicate any recent Medical Services you may have received from other than [facility] providers in the past year (date may be approximate).     Assessment:   This is a routine wellness examination for [place].  Hearing/Vision screen No results found.  Dietary issues and exercise activities discussed:     Goals Addressed             This Visit's Progress    DIET - EAT MORE FRUITS AND VEGETABLES         Depression Screen    09/18/2021    8:20 AM 08/19/2021   11:21 AM 06/24/2021    9:48 AM 05/07/2021    8:49 AM 03/07/2021   10:04 AM 11/21/2020   10:47 AM 11/12/2020    9:17 AM  PHQ 2/9 Scores  PHQ - 2 Score 0 0 0 0 0 0 0  PHQ- 9 Score '4 4 2 ' 0 '3 4 4    ' Fall Risk    08/19/2021   11:21 AM 06/24/2021    9:48 AM 05/07/2021    8:49 AM  03/07/2021   10:04 AM 11/21/2020   10:47 AM  Fall Risk   Falls in the past year? 0 0 0 0 0    FALL RISK PREVENTION PERTAINING TO THE HOME:  Any stairs in or around the home? No  If so, are there any without handrails? No  Home free of loose throw rugs in walkways, pet beds, electrical cords, etc? Yes  Adequate lighting in your home to reduce risk of falls? Yes   ASSISTIVE DEVICES UTILIZED TO PREVENT FALLS:  Life alert? No  Use of a cane, walker or w/c? Yes  Grab bars in the bathroom? Yes  Shower chair or bench in shower? Yes  Elevated toilet seat or a handicapped toilet? Yes     Cognitive Function:unable to complete 2023      07/16/2017   10:42 AM 07/15/2016    9:02 AM  MMSE - Mini Mental State Exam  Orientation to time 5 5  Orientation to Place 5 5  Registration 3 3  Attention/ Calculation 5 5  Recall 2 2  Language- name 2 objects  2 2  Language- repeat 1 1  Language- follow 3 step command 3 3  Language- read & follow direction 1 1  Write a sentence 1 1  Copy design 0 1  Total score 28 29        09/17/2020    9:21 AM 09/15/2019    9:46 AM 07/21/2018    9:39 AM  6CIT Screen  What Year? 0 points 0 points 0 points  What month? 0 points 0 points 0 points  What time? 0 points 0 points 0 points  Count back from 20 0 points 4 points 0 points  Months in reverse 4 points 2 points 0 points  Repeat phrase 0 points 2 points 2 points  Total Score 4 points 8 points 2 points    Immunizations Immunization History  Administered Date(s) Administered   Fluad Quad(high Dose 65+) 12/01/2018, 11/23/2019, 11/12/2020   Influenza Split 01/17/2010   Influenza Whole 11/07/2011   Influenza, High Dose Seasonal PF 12/14/2015, 12/01/2016, 11/23/2017   Influenza,inj,Quad PF,6+ Mos 11/08/2012, 12/02/2013, 12/28/2014   Moderna Sars-Covid-2 Vaccination 03/12/2019, 04/11/2019, 01/07/2020   Pneumococcal Conjugate-13 05/23/2013   Pneumococcal Polysaccharide-23 12/19/2007   Td 02/18/2003    Tdap 11/04/2010   Zoster, Live 08/13/2011    TDAP status: Due, Education has been provided regarding the importance of this vaccine. Advised may receive this vaccine at local pharmacy or Health Dept. Aware to provide a copy of the vaccination record if obtained from local pharmacy or Health Dept. Verbalized acceptance and understanding.  Flu Vaccine status: Up to date  Pneumococcal vaccine status: Up to date  Covid-19 vaccine status: Completed vaccines  Qualifies for Shingles Vaccine? Yes   Zostavax completed Yes   Shingrix Completed?: No.    Education has been provided regarding the importance of this vaccine. Patient has been advised to call insurance company to determine out of pocket expense if they have not yet received this vaccine. Advised may also receive vaccine at local pharmacy or Health Dept. Verbalized acceptance and understanding.  Screening Tests Health Maintenance  Topic Date Due   Zoster Vaccines- Shingrix (1 of 2) Never done   OPHTHALMOLOGY EXAM  10/16/2019   FOOT EXAM  08/11/2020   URINE MICROALBUMIN  03/01/2021   INFLUENZA VACCINE  09/17/2021   TETANUS/TDAP  11/21/2021 (Originally 11/03/2020)   HEMOGLOBIN A1C  12/25/2021   MAMMOGRAM  12/13/2022   Pneumonia Vaccine 22+ Years old  Completed   DEXA SCAN  Completed   HPV VACCINES  Aged Out   COVID-19 Vaccine  Discontinued    Health Maintenance  Health Maintenance Due  Topic Date Due   Zoster Vaccines- Shingrix (1 of 2) Never done   OPHTHALMOLOGY EXAM  10/16/2019   FOOT EXAM  08/11/2020   URINE MICROALBUMIN  03/01/2021   INFLUENZA VACCINE  09/17/2021    Colorectal cancer screening: No longer required.   Mammogram status: No longer required due to age.  Bone Density status: Completed 07/17/17. Results reflect: Bone density results: OSTEOPENIA. Repeat every 5 years.  Lung Cancer Screening: (Low Dose CT Chest recommended if Age 37-80 years, 30 pack-year currently smoking OR have quit w/in 15years.) does not  qualify.   Additional Screening:  Hepatitis C Screening: does not qualify; Completed no  Vision Screening: Recommended annual ophthalmology exams for early detection of glaucoma and other disorders of the eye. Is the patient up to date with their annual eye exam?  Yes  Who is the provider or what is the name of the office  in which the patient attends annual eye exams? [facility] in [place] If pt is not established with a provider, would they like to be referred to a provider to establish care? No .   Dental Screening: Recommended annual dental exams for proper oral hygiene  Community Resource Referral / Chronic Care Management: CRR required this visit?  No   CCM required this visit?  No      Plan:     I have personally reviewed and noted the following in the patient's chart:   Medical and social history Use of alcohol, tobacco or illicit drugs  Current medications and supplements including opioid prescriptions.  Functional ability and status Nutritional status Physical activity Advanced directives List of other physicians Hospitalizations, surgeries, and ER visits in previous 12 months Vitals Screenings to include cognitive, depression, and falls Referrals and appointments  In addition, I have reviewed and discussed with patient certain preventive protocols, quality metrics, and best practice recommendations. A written personalized care plan for preventive services as well as general preventive health recommendations were provided to patient.     Dionisio David, LPN   06/21/156   Nurse Notes: none

## 2021-09-18 NOTE — Telephone Encounter (Signed)
Order requires F2F notes, added this for her 09/24/21 OV

## 2021-09-20 ENCOUNTER — Telehealth: Payer: Self-pay | Admitting: Nurse Practitioner

## 2021-09-20 NOTE — Telephone Encounter (Signed)
Contacted family and dose of metoprolol is correct- 100mg   1 1/2 tabelt BID

## 2021-09-20 NOTE — Telephone Encounter (Signed)
She needs face to face notes documented for hospital bed order.

## 2021-09-24 ENCOUNTER — Emergency Department (HOSPITAL_COMMUNITY): Payer: Medicare HMO

## 2021-09-24 ENCOUNTER — Emergency Department (HOSPITAL_COMMUNITY)
Admission: EM | Admit: 2021-09-24 | Discharge: 2021-09-24 | Disposition: A | Discharge status: Home / Self Care | Payer: Medicare HMO | Attending: Emergency Medicine | Admitting: Emergency Medicine

## 2021-09-24 ENCOUNTER — Ambulatory Visit: Payer: Medicare HMO | Admitting: Nurse Practitioner

## 2021-09-24 ENCOUNTER — Other Ambulatory Visit: Payer: Self-pay

## 2021-09-24 ENCOUNTER — Encounter (HOSPITAL_COMMUNITY): Payer: Self-pay | Admitting: Emergency Medicine

## 2021-09-24 DIAGNOSIS — I4891 Unspecified atrial fibrillation: Secondary | ICD-10-CM | POA: Diagnosis not present

## 2021-09-24 DIAGNOSIS — Z7901 Long term (current) use of anticoagulants: Secondary | ICD-10-CM | POA: Diagnosis not present

## 2021-09-24 DIAGNOSIS — I10 Essential (primary) hypertension: Secondary | ICD-10-CM | POA: Diagnosis not present

## 2021-09-24 DIAGNOSIS — S32050A Wedge compression fracture of fifth lumbar vertebra, initial encounter for closed fracture: Secondary | ICD-10-CM | POA: Insufficient documentation

## 2021-09-24 DIAGNOSIS — S3992XA Unspecified injury of lower back, initial encounter: Secondary | ICD-10-CM | POA: Diagnosis not present

## 2021-09-24 DIAGNOSIS — T1490XA Injury, unspecified, initial encounter: Secondary | ICD-10-CM | POA: Diagnosis not present

## 2021-09-24 DIAGNOSIS — E119 Type 2 diabetes mellitus without complications: Secondary | ICD-10-CM | POA: Diagnosis not present

## 2021-09-24 DIAGNOSIS — S0990XA Unspecified injury of head, initial encounter: Secondary | ICD-10-CM | POA: Diagnosis not present

## 2021-09-24 DIAGNOSIS — S2231XA Fracture of one rib, right side, initial encounter for closed fracture: Secondary | ICD-10-CM | POA: Diagnosis not present

## 2021-09-24 DIAGNOSIS — E039 Hypothyroidism, unspecified: Secondary | ICD-10-CM | POA: Insufficient documentation

## 2021-09-24 DIAGNOSIS — W010XXA Fall on same level from slipping, tripping and stumbling without subsequent striking against object, initial encounter: Secondary | ICD-10-CM | POA: Diagnosis not present

## 2021-09-24 DIAGNOSIS — I700 Atherosclerosis of aorta: Secondary | ICD-10-CM | POA: Insufficient documentation

## 2021-09-24 DIAGNOSIS — M545 Low back pain, unspecified: Secondary | ICD-10-CM | POA: Diagnosis not present

## 2021-09-24 DIAGNOSIS — R339 Retention of urine, unspecified: Secondary | ICD-10-CM | POA: Insufficient documentation

## 2021-09-24 DIAGNOSIS — J90 Pleural effusion, not elsewhere classified: Secondary | ICD-10-CM | POA: Diagnosis not present

## 2021-09-24 DIAGNOSIS — Z743 Need for continuous supervision: Secondary | ICD-10-CM | POA: Diagnosis not present

## 2021-09-24 DIAGNOSIS — S298XXA Other specified injuries of thorax, initial encounter: Secondary | ICD-10-CM | POA: Diagnosis not present

## 2021-09-24 DIAGNOSIS — S32010A Wedge compression fracture of first lumbar vertebra, initial encounter for closed fracture: Secondary | ICD-10-CM | POA: Diagnosis not present

## 2021-09-24 DIAGNOSIS — W19XXXA Unspecified fall, initial encounter: Secondary | ICD-10-CM | POA: Diagnosis not present

## 2021-09-24 DIAGNOSIS — R102 Pelvic and perineal pain: Secondary | ICD-10-CM | POA: Diagnosis not present

## 2021-09-24 DIAGNOSIS — J9811 Atelectasis: Secondary | ICD-10-CM | POA: Diagnosis not present

## 2021-09-24 DIAGNOSIS — R52 Pain, unspecified: Secondary | ICD-10-CM | POA: Diagnosis not present

## 2021-09-24 LAB — COMPREHENSIVE METABOLIC PANEL
ALT: 23 U/L (ref 0–44)
AST: 38 U/L (ref 15–41)
Albumin: 3.5 g/dL (ref 3.5–5.0)
Alkaline Phosphatase: 98 U/L (ref 38–126)
Anion gap: 5 (ref 5–15)
BUN: 19 mg/dL (ref 8–23)
CO2: 30 mmol/L (ref 22–32)
Calcium: 8.5 mg/dL — ABNORMAL LOW (ref 8.9–10.3)
Chloride: 105 mmol/L (ref 98–111)
Creatinine, Ser: 0.89 mg/dL (ref 0.44–1.00)
GFR, Estimated: >60 mL/min (ref >60)
Glucose, Bld: 110 mg/dL — ABNORMAL HIGH (ref 70–99)
Potassium: 3.7 mmol/L (ref 3.5–5.1)
Sodium: 140 mmol/L (ref 135–145)
Total Bilirubin: 0.9 mg/dL (ref 0.3–1.2)
Total Protein: 7.1 g/dL (ref 6.5–8.1)

## 2021-09-24 LAB — CBC
HCT: 40.8 % (ref 36.0–46.0)
Hemoglobin: 12.9 g/dL (ref 12.0–15.0)
MCH: 28 pg (ref 26.0–34.0)
MCHC: 31.6 g/dL (ref 30.0–36.0)
MCV: 88.7 fL (ref 80.0–100.0)
Platelets: 305 10*3/uL (ref 150–400)
RBC: 4.6 MIL/uL (ref 3.87–5.11)
RDW: 15.7 % — ABNORMAL HIGH (ref 11.5–15.5)
WBC: 10.4 10*3/uL (ref 4.0–10.5)
nRBC: 0 % (ref 0.0–0.2)

## 2021-09-24 LAB — URINALYSIS, ROUTINE W REFLEX MICROSCOPIC
Bilirubin Urine: NEGATIVE
Glucose, UA: NEGATIVE mg/dL
Hgb urine dipstick: NEGATIVE
Ketones, ur: NEGATIVE mg/dL
Leukocytes,Ua: NEGATIVE
Nitrite: NEGATIVE
Protein, ur: NEGATIVE mg/dL
Specific Gravity, Urine: 1.008 (ref 1.005–1.030)
pH: 6 (ref 5.0–8.0)

## 2021-09-24 MED ORDER — LIDOCAINE 5 % EX PTCH
1.0000 | MEDICATED_PATCH | CUTANEOUS | Status: DC
Start: 2021-09-24 — End: 2021-09-24
  Administered 2021-09-24: 1 via TRANSDERMAL
  Filled 2021-09-24: qty 1

## 2021-09-24 MED ORDER — GADOBUTROL 1 MMOL/ML IV SOLN
7.0000 mL | Freq: Once | INTRAVENOUS | Status: AC | PRN
  Administered 2021-09-24: 7 mL via INTRAVENOUS

## 2021-09-24 MED ORDER — IOHEXOL 350 MG/ML SOLN
75.0000 mL | Freq: Once | INTRAVENOUS | Status: AC | PRN
  Administered 2021-09-24: 75 mL via INTRAVENOUS

## 2021-09-24 MED ORDER — OXYCODONE-ACETAMINOPHEN 5-325 MG PO TABS: 1.0000 | ORAL_TABLET | Freq: Four times a day (QID) | ORAL | 0 refills | Status: DC | PRN

## 2021-09-24 MED ORDER — OXYCODONE-ACETAMINOPHEN 5-325 MG PO TABS
1.0000 | ORAL_TABLET | Freq: Once | ORAL | Status: AC
  Administered 2021-09-24: 1 via ORAL
  Filled 2021-09-24: qty 1

## 2021-09-24 NOTE — ED Provider Notes (Signed)
[facility] EMERGENCY DEPARTMENT Provider Note   CSN: [id number] Arrival date & time: 09/24/21  1022     History {Add pertinent medical, surgical, social history, OB history to HPI:1} Chief Complaint  Patient presents with   Veronica Paul is a 86 y.o. female.   Fall  Patient presents after a fall.  Medical history includes HTN, HLD, osteopenia, DM, anxiety, hypothyroidism, atrial fibrillation, and depression.  She had a hospitalization in June for aspiration pneumonia.  Since that time, she has had some deconditioning.  She has been staying with her daughter at home.  Patient's daughter was taking her to her podiatry appointment today.  This was a routine appointment so she could get her toenails checked.  While moving the patient in a wheelchair, the wheelchair tipped backwards.  This caused the patient to strike her back against the ground.  Patient's daughter is unsure if the patient hit her head.  Patient has since been complaining of mid back pain.  Patient is on anticoagulation for atrial fibrillation.  Patient, herself, endorses pain in the area of T12-L1.  She denies any other areas of discomfort.     Home Medications Prior to Admission medications   Medication Sig Start Date End Date Taking? Authorizing Provider  acetaminophen (TYLENOL) 500 MG tablet Take 500 mg by mouth every 6 (six) hours as needed for moderate pain, headache or fever.    [provider]  bisacodyl (DULCOLAX) 5 MG EC tablet Take 5 mg by mouth daily as needed for moderate constipation (usually twice per week).    [provider]  Blood Glucose Monitoring Suppl [facility] VERIO) w/Device KIT Check blood sugars daily Dx E11.9 08/24/18   Chevis Pretty, FNP  [facility] MELATONIN 10 MG CAPS TAKE 1 CAPSULE BY MOUTH EVERY DAY AT BEDTIME AS NEEDED 09/13/21   Hassell Done, Mary-Margaret, FNP  diltiazem (CARDIZEM) 90 MG tablet TAKE 1 TABLET (90 MG TOTAL) BY MOUTH 3 (THREE) TIMES DAILY. 09/13/21    Hassell Done Mary-Margaret, FNP  ELIQUIS 5 MG TABS tablet TAKE 1 TABLET BY MOUTH TWICE A DAY 09/09/21   Hassell Done, Mary-Margaret, FNP  feeding supplement (ENSURE ENLIVE / ENSURE PLUS) LIQD Take 237 mLs by mouth 3 (three) times daily between meals. 02/27/21   Atway, Rayann N, DO  furosemide (LASIX) 20 MG tablet Take 1 tablet (20 mg total) by mouth daily. 06/24/21   Hassell Done Mary-Margaret, FNP  glucose blood [facility] VERIO) test strip CHECK BLOOD SUGARS DAILY E11.9 09/09/21   Chevis Pretty, FNP  Lancet Devices [facility] DELICA PLUS LANCING) MISC 1 Device by Does not apply route daily. 03/01/20   Chevis Pretty, FNP  Lancets [facility] DELICA PLUS [id number]) MISC USE TO CHECK BLOOD SUGARS DAILY DX E11.9 11/26/20   Hassell Done Mary-Margaret, FNP  levothyroxine (SYNTHROID) 88 MCG tablet Take 1 tablet (88 mcg total) by mouth daily. 06/24/21   Hassell Done, Mary-Margaret, FNP  metoprolol tartrate (LOPRESSOR) 100 MG tablet TAKE 1.5 TABLETS BY MOUTH 2 TIMES DAILY. 09/13/21   Hassell Done Mary-Margaret, FNP  mupirocin ointment (BACTROBAN) 2 % Apply topically 2 (two) times daily. 07/30/21   [provider]  nitroGLYCERIN (NITROSTAT) 0.4 MG SL tablet Place 1 tablet (0.4 mg total) under the tongue every 5 (five) minutes as needed for chest pain. 11/21/20   Hassell Done Mary-Margaret, FNP  nystatin (MYCOSTATIN/NYSTOP) powder Apply 1 application. topically 3 (three) times daily. 04/25/21   Hassell Done, Mary-Margaret, FNP  omeprazole (PRILOSEC) 20 MG capsule Take 1 capsule (20 mg total) by mouth daily. 06/24/21  Hassell Done, Mary-Margaret, FNP  ondansetron (ZOFRAN-ODT) 4 MG disintegrating tablet Take 1 tablet (4 mg total) by mouth every 8 (eight) hours as needed for nausea or vomiting. Patient not taking: Reported on 09/18/2021 07/18/21   Chevis Pretty, FNP  polyethylene glycol (MIRALAX / GLYCOLAX) 17 g packet Take 17 g by mouth daily as needed for mild constipation.    [provider]  promethazine-dextromethorphan  (PROMETHAZINE-DM) 6.25-15 MG/5ML syrup Take 5 mLs by mouth 4 (four) times daily as needed for cough. 08/19/21   Hassell Done, Mary-Margaret, FNP  sertraline (ZOLOFT) 50 MG tablet Take 1 tablet (50 mg total) by mouth daily. 06/24/21   Hassell Done Mary-Margaret, FNP      Allergies    Ace inhibitors, Feldene [piroxicam], Lexapro [escitalopram], Meloxicam, Prevnar [pneumococcal 13-val conj vacc], Statins, Sulfa antibiotics, and Zithromax [azithromycin dihydrate]    Review of Systems   Review of Systems  Musculoskeletal:  Positive for back pain.  All other systems reviewed and are negative.   Physical Exam Updated Vital Signs LMP 05/11/1992  Physical Exam Vitals and nursing note reviewed.  Constitutional:      General: She is not in acute distress.    Appearance: Normal appearance. She is well-developed. She is not ill-appearing, toxic-appearing or diaphoretic.  HENT:     Head: Normocephalic and atraumatic.     Right Ear: External ear normal.     Left Ear: External ear normal.     Nose: Nose normal.     Mouth/Throat:     Mouth: Mucous membranes are moist.     Pharynx: Oropharynx is clear.  Eyes:     Extraocular Movements: Extraocular movements intact.     Conjunctiva/sclera: Conjunctivae normal.  Cardiovascular:     Rate and Rhythm: Normal rate and regular rhythm.     Heart sounds: No murmur heard. Pulmonary:     Effort: Pulmonary effort is normal. No respiratory distress.     Breath sounds: Normal breath sounds. No wheezing or rales.  Chest:     Chest wall: No tenderness.  Abdominal:     Palpations: Abdomen is soft.     Tenderness: There is no abdominal tenderness.  Musculoskeletal:        General: Tenderness (T12/L1) present. No swelling. Normal range of motion.     Cervical back: Normal range of motion and neck supple.  Skin:    General: Skin is warm and dry.     Coloration: Skin is not jaundiced or pale.  Neurological:     General: No focal deficit present.     Mental Status: She  is alert. Mental status is at baseline.     Cranial Nerves: No cranial nerve deficit.     Sensory: No sensory deficit.     Motor: No weakness.     Coordination: Coordination normal.  Psychiatric:        Mood and Affect: Mood normal.        Behavior: Behavior normal.     ED Results / Procedures / Treatments   Labs (all labs ordered are listed, but only abnormal results are displayed) Labs Reviewed - No data to display  EKG None  Radiology No results found.  Procedures Procedures  {Document cardiac monitor, telemetry assessment procedure when appropriate:1}  Medications Ordered in ED Medications - No data to display  ED Course/ Medical Decision Making/ A&P                           Medical  Decision Making  ***  {Document critical care time when appropriate:1} {Document review of labs and clinical decision tools ie heart score, Chads2Vasc2 etc:1}  {Document your independent review of radiology images, and any outside records:1} {Document your discussion with family members, caretakers, and with consultants:1} {Document social determinants of health affecting pt's care:1} {Document your decision making why or why not admission, treatments were needed:1} Final Clinical Impression(s) / ED Diagnoses Final diagnoses:  None    Rx / DC Orders ED Discharge Orders     None

## 2021-09-24 NOTE — ED Triage Notes (Signed)
Pt to the ED with [facility] after tipping her walker over going into the doctor office and falling to the ground. Her daughter was able to support her head.  She is complaining of lower back pain of a 7 out of 10.

## 2021-09-24 NOTE — ED Notes (Signed)
Patient's daughter states that the patient has urinated since 0830 this morning which is unusual.  Bladder scanned the patient.  Bladder scan showing >850 mL, provider notified.

## 2021-09-24 NOTE — ED Provider Notes (Signed)
Signout from Dr. Doren Custard.  86 year old female with recent aspiration here after what sounds like a mechanical fall.  Complaining of some low back pain.  CT showing compression fracture.  Also had urinary retention and Foley placed.  Plan is to follow-up on chest CT as did have an abnormality along with MRI lumbar spine. Physical Exam  BP (!) 141/75   Pulse 73   Temp 97.7 F (36.5 C) (Oral)   Resp (!) 23   Ht 5\' 3"  (1.6 m)   Wt 70 kg   LMP 05/11/1992   SpO2 95%   BMI 27.35 kg/m   Physical Exam  Procedures  Procedures  ED Course / MDM    Medical Decision Making Amount and/or Complexity of Data Reviewed Labs: ordered. Radiology: ordered.  Risk Prescription drug management.   MRI lumbar spine does not show any evidence of cauda equina.  Does have compression fracture and some degenerative changes.  CT showing some possible sequelae of her prior aspiration with mucous plugging.  Will ambulate here to see if is appropriate for discharge and outpatient follow-up versus admission.  Patient was able to ambulate here without difficulty.  Pain seems adequately controlled.  Reviewed results of imaging with daughter.  Given contact information for neurosurgery and urology.  Also explained CT chest findings that will need outpatient follow-up.  Daughter in agreement with plan for discharge home.  Understands can return to the hospital if any worsening or concerning symptoms.       Hayden Rasmussen, MD 09/25/21 1225

## 2021-09-24 NOTE — ED Notes (Signed)
Ambulated patient around the unit with a walker.  Patient had a steady gait and no complaints of dizziness or pain.  Provider made aware.

## 2021-09-24 NOTE — Discharge Instructions (Addendum)
You were seen in the emergency department for evaluation of injuries from a fall.  You had a CAT scan and MRI of your back that showed an lumbar #5 compression fracture.  This will need follow-up with neurosurgery.  We are prescribing you some pain medications to help with pain, please use caution as this can make you constipated dizzy nauseous.  The CAT scan of your chest showed some findings that we will also need follow-up with your primary care doctor.  Included below is report of the CAT scan.  You also were having difficulty passing urine and a Foley catheter was placed.  This will need follow-up with urology.  Return to the emergency department if any worsening or concerning symptoms.  5. Constellation of imaging findings could reflect aspiration  related changes with mucous plugging and superimposed infection in  the right upper lobe. Alternatively, given the nodular pleural  thickening in the right hemithorax, findings could represent  malignancy with mass at the right hilum. Consider further evaluation  with bronchoscopy and PET-CT.

## 2021-09-25 DIAGNOSIS — K5900 Constipation, unspecified: Secondary | ICD-10-CM | POA: Diagnosis not present

## 2021-09-25 DIAGNOSIS — R1310 Dysphagia, unspecified: Secondary | ICD-10-CM | POA: Diagnosis not present

## 2021-09-25 DIAGNOSIS — Z7901 Long term (current) use of anticoagulants: Secondary | ICD-10-CM | POA: Diagnosis not present

## 2021-09-25 DIAGNOSIS — I10 Essential (primary) hypertension: Secondary | ICD-10-CM | POA: Diagnosis not present

## 2021-09-25 DIAGNOSIS — Z9842 Cataract extraction status, left eye: Secondary | ICD-10-CM | POA: Diagnosis not present

## 2021-09-25 DIAGNOSIS — Z6828 Body mass index (BMI) 28.0-28.9, adult: Secondary | ICD-10-CM | POA: Diagnosis not present

## 2021-09-25 DIAGNOSIS — Z85118 Personal history of other malignant neoplasm of bronchus and lung: Secondary | ICD-10-CM | POA: Diagnosis not present

## 2021-09-25 DIAGNOSIS — Z9181 History of falling: Secondary | ICD-10-CM | POA: Diagnosis not present

## 2021-09-25 DIAGNOSIS — K589 Irritable bowel syndrome without diarrhea: Secondary | ICD-10-CM | POA: Diagnosis not present

## 2021-09-25 DIAGNOSIS — K219 Gastro-esophageal reflux disease without esophagitis: Secondary | ICD-10-CM | POA: Diagnosis not present

## 2021-09-25 DIAGNOSIS — E559 Vitamin D deficiency, unspecified: Secondary | ICD-10-CM | POA: Diagnosis not present

## 2021-09-25 DIAGNOSIS — J9601 Acute respiratory failure with hypoxia: Secondary | ICD-10-CM | POA: Diagnosis not present

## 2021-09-25 DIAGNOSIS — R69 Illness, unspecified: Secondary | ICD-10-CM | POA: Diagnosis not present

## 2021-09-25 DIAGNOSIS — I4891 Unspecified atrial fibrillation: Secondary | ICD-10-CM | POA: Diagnosis not present

## 2021-09-25 DIAGNOSIS — E872 Acidosis, unspecified: Secondary | ICD-10-CM | POA: Diagnosis not present

## 2021-09-25 DIAGNOSIS — M1990 Unspecified osteoarthritis, unspecified site: Secondary | ICD-10-CM | POA: Diagnosis not present

## 2021-09-25 DIAGNOSIS — E785 Hyperlipidemia, unspecified: Secondary | ICD-10-CM | POA: Diagnosis not present

## 2021-09-25 DIAGNOSIS — E039 Hypothyroidism, unspecified: Secondary | ICD-10-CM | POA: Diagnosis not present

## 2021-09-25 DIAGNOSIS — Z9841 Cataract extraction status, right eye: Secondary | ICD-10-CM | POA: Diagnosis not present

## 2021-09-25 DIAGNOSIS — M8580 Other specified disorders of bone density and structure, unspecified site: Secondary | ICD-10-CM | POA: Diagnosis not present

## 2021-09-25 DIAGNOSIS — E119 Type 2 diabetes mellitus without complications: Secondary | ICD-10-CM | POA: Diagnosis not present

## 2021-09-26 ENCOUNTER — Ambulatory Visit (INDEPENDENT_AMBULATORY_CARE_PROVIDER_SITE_OTHER): Payer: Medicare HMO | Admitting: Nurse Practitioner

## 2021-09-26 ENCOUNTER — Telehealth: Payer: Self-pay | Admitting: Nurse Practitioner

## 2021-09-26 ENCOUNTER — Encounter: Payer: Self-pay | Admitting: Nurse Practitioner

## 2021-09-26 VITALS — BP 122/72 | HR 70 | Temp 96.9°F | Resp 20 | Ht 63.0 in | Wt 157.0 lb

## 2021-09-26 DIAGNOSIS — R918 Other nonspecific abnormal finding of lung field: Secondary | ICD-10-CM

## 2021-09-26 DIAGNOSIS — R2681 Unsteadiness on feet: Secondary | ICD-10-CM | POA: Diagnosis not present

## 2021-09-26 DIAGNOSIS — W19XXXA Unspecified fall, initial encounter: Secondary | ICD-10-CM | POA: Diagnosis not present

## 2021-09-26 MED ORDER — DOXYCYCLINE HYCLATE 100 MG PO TABS: 100.0000 mg | ORAL_TABLET | Freq: Two times a day (BID) | ORAL | 0 refills | Status: DC

## 2021-09-26 NOTE — Telephone Encounter (Signed)
No if it is capsule- she can open it and put in applesauce

## 2021-09-26 NOTE — Progress Notes (Addendum)
Subjective:    Patient ID: Veronica Paul, female    DOB: 1935/04/25, 86 y.o.   MRN: [id number]   Chief Complaint: ED follow up  HPI Patient comes in for ED follow up . Her daughter was taking her to see Foot specialist an her wheel chair got hung on sidewalk and dumped patient out on side walk. EMS was called. No fractures. Is really sore all over.  - needs hospital bed- she is not able to raise herself up to get out of bed. Has had several falls trying to get out of bed.    Review of Systems  Constitutional:  Negative for diaphoresis.  Eyes:  Negative for pain.  Respiratory:  Negative for shortness of breath.   Cardiovascular:  Negative for chest pain, palpitations and leg swelling.  Gastrointestinal:  Negative for abdominal pain.  Endocrine: Negative for polydipsia.  Skin:  Negative for rash.  Neurological:  Negative for dizziness, weakness and headaches.  Hematological:  Does not bruise/bleed easily.  All other systems reviewed and are negative.      Objective:   Physical Exam Vitals and nursing note reviewed.  Constitutional:      General: She is not in acute distress.    Appearance: Normal appearance. She is well-developed.  Neck:     Vascular: No carotid bruit or JVD.  Cardiovascular:     Rate and Rhythm: Normal rate and regular rhythm.     Heart sounds: Normal heart sounds.  Pulmonary:     Effort: Pulmonary effort is normal. No respiratory distress.     Breath sounds: Normal breath sounds. No wheezing or rales.  Chest:     Chest wall: No tenderness.  Abdominal:     General: There is no distension or abdominal bruit.     Palpations: There is no hepatomegaly, splenomegaly, mass or pulsatile mass.     Tenderness: There is no abdominal tenderness.  Musculoskeletal:        General: Normal range of motion.     Cervical back: Normal range of motion and neck supple.     Comments: Gait slow and unsteady  Lymphadenopathy:     Cervical: No cervical adenopathy.   Skin:    General: Skin is warm and dry.  Neurological:     Mental Status: She is alert and oriented to person, place, and time.     Deep Tendon Reflexes: Reflexes are normal and symmetric.  Psychiatric:        Behavior: Behavior normal.        Thought Content: Thought content normal.        Judgment: Judgment normal.     BP 122/72   Pulse 70   Temp (!) 96.9 F (36.1 C) (Temporal)   Resp 20   Ht 5\' 3"  (1.6 m)   Wt 157 lb (71.2 kg)   LMP 05/11/1992   SpO2 96%   BMI 27.81 kg/m        Assessment & Plan:  Veronica Paul in today with chief complaint of Hospitalization Follow-up   1. Unsteady gait Fall prevention - DME Wheelchair manual  2. Fall, initial encounter Order j=hospital bed - For home use only DME Hospital bed  3. ED follow up Continue pain meds as prescribed only taking 1/2 tablet when needed Hospital records reviewed Possible lung mass- with some part of right lung collapse  The above assessment and management plan was discussed with the patient. The patient verbalized understanding of and has agreed to  the management plan. Patient is aware to call the clinic if symptoms persist or worsen. Patient is aware when to return to the clinic for a follow-up visit. Patient educated on when it is appropriate to go to the emergency department.   [name], FNP

## 2021-09-26 NOTE — Telephone Encounter (Signed)
Patients daughter states that the med came as a pill instead of a capsule. Advised daughter ok to crush and give to patient. Daughter verbalized understanding

## 2021-09-26 NOTE — Patient Instructions (Signed)

## 2021-09-26 NOTE — Addendum Note (Signed)
Addended by: Chevis Pretty on: 09/26/2021 01:03 PM   Modules accepted: Orders

## 2021-09-27 DIAGNOSIS — Z9181 History of falling: Secondary | ICD-10-CM | POA: Diagnosis not present

## 2021-09-27 DIAGNOSIS — Z9841 Cataract extraction status, right eye: Secondary | ICD-10-CM | POA: Diagnosis not present

## 2021-09-27 DIAGNOSIS — E872 Acidosis, unspecified: Secondary | ICD-10-CM | POA: Diagnosis not present

## 2021-09-27 DIAGNOSIS — R69 Illness, unspecified: Secondary | ICD-10-CM | POA: Diagnosis not present

## 2021-09-27 DIAGNOSIS — E039 Hypothyroidism, unspecified: Secondary | ICD-10-CM | POA: Diagnosis not present

## 2021-09-27 DIAGNOSIS — Z6828 Body mass index (BMI) 28.0-28.9, adult: Secondary | ICD-10-CM | POA: Diagnosis not present

## 2021-09-27 DIAGNOSIS — K219 Gastro-esophageal reflux disease without esophagitis: Secondary | ICD-10-CM | POA: Diagnosis not present

## 2021-09-27 DIAGNOSIS — Z7901 Long term (current) use of anticoagulants: Secondary | ICD-10-CM | POA: Diagnosis not present

## 2021-09-27 DIAGNOSIS — J9601 Acute respiratory failure with hypoxia: Secondary | ICD-10-CM | POA: Diagnosis not present

## 2021-09-27 DIAGNOSIS — R1310 Dysphagia, unspecified: Secondary | ICD-10-CM | POA: Diagnosis not present

## 2021-09-27 DIAGNOSIS — K5900 Constipation, unspecified: Secondary | ICD-10-CM | POA: Diagnosis not present

## 2021-09-27 DIAGNOSIS — E119 Type 2 diabetes mellitus without complications: Secondary | ICD-10-CM | POA: Diagnosis not present

## 2021-09-27 DIAGNOSIS — I4891 Unspecified atrial fibrillation: Secondary | ICD-10-CM | POA: Diagnosis not present

## 2021-09-27 DIAGNOSIS — I10 Essential (primary) hypertension: Secondary | ICD-10-CM | POA: Diagnosis not present

## 2021-09-27 DIAGNOSIS — Z85118 Personal history of other malignant neoplasm of bronchus and lung: Secondary | ICD-10-CM | POA: Diagnosis not present

## 2021-09-27 DIAGNOSIS — E559 Vitamin D deficiency, unspecified: Secondary | ICD-10-CM | POA: Diagnosis not present

## 2021-09-27 DIAGNOSIS — Z9842 Cataract extraction status, left eye: Secondary | ICD-10-CM | POA: Diagnosis not present

## 2021-09-27 DIAGNOSIS — M8580 Other specified disorders of bone density and structure, unspecified site: Secondary | ICD-10-CM | POA: Diagnosis not present

## 2021-09-27 DIAGNOSIS — M1990 Unspecified osteoarthritis, unspecified site: Secondary | ICD-10-CM | POA: Diagnosis not present

## 2021-09-27 DIAGNOSIS — K589 Irritable bowel syndrome without diarrhea: Secondary | ICD-10-CM | POA: Diagnosis not present

## 2021-09-27 DIAGNOSIS — E785 Hyperlipidemia, unspecified: Secondary | ICD-10-CM | POA: Diagnosis not present

## 2021-09-30 ENCOUNTER — Telehealth: Payer: Self-pay | Admitting: Nurse Practitioner

## 2021-09-30 DIAGNOSIS — K5900 Constipation, unspecified: Secondary | ICD-10-CM | POA: Diagnosis not present

## 2021-09-30 DIAGNOSIS — E872 Acidosis, unspecified: Secondary | ICD-10-CM | POA: Diagnosis not present

## 2021-09-30 DIAGNOSIS — I4891 Unspecified atrial fibrillation: Secondary | ICD-10-CM | POA: Diagnosis not present

## 2021-09-30 DIAGNOSIS — R1310 Dysphagia, unspecified: Secondary | ICD-10-CM | POA: Diagnosis not present

## 2021-09-30 DIAGNOSIS — E039 Hypothyroidism, unspecified: Secondary | ICD-10-CM | POA: Diagnosis not present

## 2021-09-30 DIAGNOSIS — R69 Illness, unspecified: Secondary | ICD-10-CM | POA: Diagnosis not present

## 2021-09-30 DIAGNOSIS — Z9841 Cataract extraction status, right eye: Secondary | ICD-10-CM | POA: Diagnosis not present

## 2021-09-30 DIAGNOSIS — K589 Irritable bowel syndrome without diarrhea: Secondary | ICD-10-CM | POA: Diagnosis not present

## 2021-09-30 DIAGNOSIS — K219 Gastro-esophageal reflux disease without esophagitis: Secondary | ICD-10-CM | POA: Diagnosis not present

## 2021-09-30 DIAGNOSIS — M1990 Unspecified osteoarthritis, unspecified site: Secondary | ICD-10-CM | POA: Diagnosis not present

## 2021-09-30 DIAGNOSIS — E559 Vitamin D deficiency, unspecified: Secondary | ICD-10-CM | POA: Diagnosis not present

## 2021-09-30 DIAGNOSIS — M8580 Other specified disorders of bone density and structure, unspecified site: Secondary | ICD-10-CM | POA: Diagnosis not present

## 2021-09-30 DIAGNOSIS — E785 Hyperlipidemia, unspecified: Secondary | ICD-10-CM | POA: Diagnosis not present

## 2021-09-30 DIAGNOSIS — Z9842 Cataract extraction status, left eye: Secondary | ICD-10-CM | POA: Diagnosis not present

## 2021-09-30 DIAGNOSIS — Z6828 Body mass index (BMI) 28.0-28.9, adult: Secondary | ICD-10-CM | POA: Diagnosis not present

## 2021-09-30 DIAGNOSIS — Z85118 Personal history of other malignant neoplasm of bronchus and lung: Secondary | ICD-10-CM | POA: Diagnosis not present

## 2021-09-30 DIAGNOSIS — Z9181 History of falling: Secondary | ICD-10-CM | POA: Diagnosis not present

## 2021-09-30 DIAGNOSIS — Z7901 Long term (current) use of anticoagulants: Secondary | ICD-10-CM | POA: Diagnosis not present

## 2021-09-30 DIAGNOSIS — I10 Essential (primary) hypertension: Secondary | ICD-10-CM | POA: Diagnosis not present

## 2021-09-30 DIAGNOSIS — J9601 Acute respiratory failure with hypoxia: Secondary | ICD-10-CM | POA: Diagnosis not present

## 2021-09-30 DIAGNOSIS — E119 Type 2 diabetes mellitus without complications: Secondary | ICD-10-CM | POA: Diagnosis not present

## 2021-09-30 NOTE — Telephone Encounter (Signed)
09/26/21 OV notes routed via Epic to [facility]

## 2021-10-01 ENCOUNTER — Telehealth: Payer: Self-pay | Admitting: Nurse Practitioner

## 2021-10-01 DIAGNOSIS — R69 Illness, unspecified: Secondary | ICD-10-CM | POA: Diagnosis not present

## 2021-10-01 DIAGNOSIS — M1990 Unspecified osteoarthritis, unspecified site: Secondary | ICD-10-CM | POA: Diagnosis not present

## 2021-10-01 DIAGNOSIS — Z6828 Body mass index (BMI) 28.0-28.9, adult: Secondary | ICD-10-CM | POA: Diagnosis not present

## 2021-10-01 DIAGNOSIS — M8580 Other specified disorders of bone density and structure, unspecified site: Secondary | ICD-10-CM | POA: Diagnosis not present

## 2021-10-01 DIAGNOSIS — Z9842 Cataract extraction status, left eye: Secondary | ICD-10-CM | POA: Diagnosis not present

## 2021-10-01 DIAGNOSIS — Z9181 History of falling: Secondary | ICD-10-CM | POA: Diagnosis not present

## 2021-10-01 DIAGNOSIS — K219 Gastro-esophageal reflux disease without esophagitis: Secondary | ICD-10-CM | POA: Diagnosis not present

## 2021-10-01 DIAGNOSIS — Z9841 Cataract extraction status, right eye: Secondary | ICD-10-CM | POA: Diagnosis not present

## 2021-10-01 DIAGNOSIS — E039 Hypothyroidism, unspecified: Secondary | ICD-10-CM | POA: Diagnosis not present

## 2021-10-01 DIAGNOSIS — I4891 Unspecified atrial fibrillation: Secondary | ICD-10-CM | POA: Diagnosis not present

## 2021-10-01 DIAGNOSIS — E872 Acidosis, unspecified: Secondary | ICD-10-CM | POA: Diagnosis not present

## 2021-10-01 DIAGNOSIS — E785 Hyperlipidemia, unspecified: Secondary | ICD-10-CM | POA: Diagnosis not present

## 2021-10-01 DIAGNOSIS — K589 Irritable bowel syndrome without diarrhea: Secondary | ICD-10-CM | POA: Diagnosis not present

## 2021-10-01 DIAGNOSIS — K5900 Constipation, unspecified: Secondary | ICD-10-CM | POA: Diagnosis not present

## 2021-10-01 DIAGNOSIS — Z7901 Long term (current) use of anticoagulants: Secondary | ICD-10-CM | POA: Diagnosis not present

## 2021-10-01 DIAGNOSIS — R1310 Dysphagia, unspecified: Secondary | ICD-10-CM | POA: Diagnosis not present

## 2021-10-01 DIAGNOSIS — Z85118 Personal history of other malignant neoplasm of bronchus and lung: Secondary | ICD-10-CM | POA: Diagnosis not present

## 2021-10-01 DIAGNOSIS — E119 Type 2 diabetes mellitus without complications: Secondary | ICD-10-CM | POA: Diagnosis not present

## 2021-10-01 DIAGNOSIS — E559 Vitamin D deficiency, unspecified: Secondary | ICD-10-CM | POA: Diagnosis not present

## 2021-10-01 DIAGNOSIS — I10 Essential (primary) hypertension: Secondary | ICD-10-CM | POA: Diagnosis not present

## 2021-10-01 DIAGNOSIS — J9601 Acute respiratory failure with hypoxia: Secondary | ICD-10-CM | POA: Diagnosis not present

## 2021-10-02 DIAGNOSIS — E559 Vitamin D deficiency, unspecified: Secondary | ICD-10-CM | POA: Diagnosis not present

## 2021-10-02 DIAGNOSIS — Z6828 Body mass index (BMI) 28.0-28.9, adult: Secondary | ICD-10-CM | POA: Diagnosis not present

## 2021-10-02 DIAGNOSIS — K5900 Constipation, unspecified: Secondary | ICD-10-CM | POA: Diagnosis not present

## 2021-10-02 DIAGNOSIS — I10 Essential (primary) hypertension: Secondary | ICD-10-CM | POA: Diagnosis not present

## 2021-10-02 DIAGNOSIS — Z9841 Cataract extraction status, right eye: Secondary | ICD-10-CM | POA: Diagnosis not present

## 2021-10-02 DIAGNOSIS — Z7901 Long term (current) use of anticoagulants: Secondary | ICD-10-CM | POA: Diagnosis not present

## 2021-10-02 DIAGNOSIS — E119 Type 2 diabetes mellitus without complications: Secondary | ICD-10-CM | POA: Diagnosis not present

## 2021-10-02 DIAGNOSIS — M1990 Unspecified osteoarthritis, unspecified site: Secondary | ICD-10-CM | POA: Diagnosis not present

## 2021-10-02 DIAGNOSIS — K219 Gastro-esophageal reflux disease without esophagitis: Secondary | ICD-10-CM | POA: Diagnosis not present

## 2021-10-02 DIAGNOSIS — R1310 Dysphagia, unspecified: Secondary | ICD-10-CM | POA: Diagnosis not present

## 2021-10-02 DIAGNOSIS — E785 Hyperlipidemia, unspecified: Secondary | ICD-10-CM | POA: Diagnosis not present

## 2021-10-02 DIAGNOSIS — R69 Illness, unspecified: Secondary | ICD-10-CM | POA: Diagnosis not present

## 2021-10-02 DIAGNOSIS — E039 Hypothyroidism, unspecified: Secondary | ICD-10-CM | POA: Diagnosis not present

## 2021-10-02 DIAGNOSIS — I4891 Unspecified atrial fibrillation: Secondary | ICD-10-CM | POA: Diagnosis not present

## 2021-10-02 DIAGNOSIS — J9601 Acute respiratory failure with hypoxia: Secondary | ICD-10-CM | POA: Diagnosis not present

## 2021-10-02 DIAGNOSIS — K589 Irritable bowel syndrome without diarrhea: Secondary | ICD-10-CM | POA: Diagnosis not present

## 2021-10-02 DIAGNOSIS — Z9181 History of falling: Secondary | ICD-10-CM | POA: Diagnosis not present

## 2021-10-02 DIAGNOSIS — E872 Acidosis, unspecified: Secondary | ICD-10-CM | POA: Diagnosis not present

## 2021-10-02 DIAGNOSIS — Z85118 Personal history of other malignant neoplasm of bronchus and lung: Secondary | ICD-10-CM | POA: Diagnosis not present

## 2021-10-02 DIAGNOSIS — Z9842 Cataract extraction status, left eye: Secondary | ICD-10-CM | POA: Diagnosis not present

## 2021-10-02 DIAGNOSIS — M8580 Other specified disorders of bone density and structure, unspecified site: Secondary | ICD-10-CM | POA: Diagnosis not present

## 2021-10-03 ENCOUNTER — Encounter: Payer: Self-pay | Admitting: Pulmonary Disease

## 2021-10-03 ENCOUNTER — Ambulatory Visit: Payer: Medicare HMO | Admitting: Pulmonary Disease

## 2021-10-03 VITALS — BP 108/60 | HR 68 | Ht 63.0 in | Wt 157.0 lb

## 2021-10-03 DIAGNOSIS — J929 Pleural plaque without asbestos: Secondary | ICD-10-CM

## 2021-10-03 DIAGNOSIS — J181 Lobar pneumonia, unspecified organism: Secondary | ICD-10-CM

## 2021-10-03 DIAGNOSIS — R599 Enlarged lymph nodes, unspecified: Secondary | ICD-10-CM

## 2021-10-03 NOTE — Progress Notes (Signed)
Synopsis: Referred in August 2023 for lung mass by Hassell Done, Mary-Margaret, *  Subjective:   PATIENT ID: Veronica Paul GENDER: female DOB: 10/13/1935, MRN: [id number]  Chief Complaint  Patient presents with   Consult    Consult: SOB, coughing up mucus (white), wheezing.    This is an 86 year old female, past medical history of hypertension, hyperlipidemia, atrial fibrillation on Eliquis.History of lung cancer with a resection in 1998 completed by Dr. Arlyce Dice.Patient had CT scan of the chest on 09/24/2021 found to have postoperative changes consistent with a right lower lobectomy atelectasis of the right middle lobe.  Irregular nodular pleural thickening in the right hemithorax enlarged precarinal and right hilar nodes concerning for recurrence of disease.  Patient has had a history of strokes.  She has had difficulty with aphasia and dysphagia after the strokes.  She is currently on a blood thinner.  She was treated with antibiotics.  Recent imaging shows consolidation in the right lower lobe.  No visible endobronchial lesion she does have some enlarged lymph nodes in the mediastinum.    Past Medical History:  Diagnosis Date   Allergy    "my nose runs alot"   Anxiety    Arthritis    Atrial fibrillation ([place])    Blood transfusion without reported diagnosis    "when I had a miscarrage in 1957"   Cataract    bilateral removed   Cholelithiasis    Constipation    x ray showed increased stool in colon- uses Miralax daily and Dulcolax twice a week    Depression    Diabetes mellitus    Dyslipidemia    HTN (hypertension)    Hx of long-term (current) use of anticoagulants    Hyperlipidemia    Hypertension    Hypothyroidism    Lung cancer ([place])    Obesity    Osteopenia    Vitamin D deficiency      Family History  Problem Relation Age of Onset   Coronary artery disease Brother 1   Hypertension Brother    Heart disease Brother    Hypertension Mother    Hypertension Father     Heart disease Father    Hypertension Sister    Heart disease Sister    Cancer Sister    Arthritis Daughter    COPD Daughter    Fibromyalgia Daughter    Ulcerative colitis Son    Stroke Maternal Grandmother    Cancer Brother    Lung cancer Brother    Hypertension Sister    Cancer Sister    Colon cancer Neg Hx    Food intolerance Neg Hx    Esophageal cancer Neg Hx    Rectal cancer Neg Hx    Stomach cancer Neg Hx    Colon polyps Neg Hx      Past Surgical History:  Procedure Laterality Date   CATARACT EXTRACTION Bilateral    COLONOSCOPY     COLONOSCOPY WITH PROPOFOL N/A 05/06/2019   Procedure: COLONOSCOPY WITH PROPOFOL;  Surgeon: Jerene Bears, MD;  Location: [facility];  Service: Gastroenterology;  Laterality: N/A;   ESOPHAGOGASTRODUODENOSCOPY (EGD) WITH PROPOFOL N/A 08/11/2021   Procedure: ESOPHAGOGASTRODUODENOSCOPY (EGD) WITH PROPOFOL;  Surgeon: Harvel Quale, MD;  Location: [facility];  Service: Gastroenterology;  Laterality: N/A;   LUNG REMOVAL, PARTIAL  1998   Right   POLYPECTOMY  05/06/2019   Procedure: POLYPECTOMY;  Surgeon: Jerene Bears, MD;  Location: [facility];  Service: Gastroenterology;;   Azzie Almas DILATION  08/11/2021  Procedure: SAVORY DILATION;  Surgeon: Montez Morita, Quillian Quince, MD;  Location: [facility];  Service: Gastroenterology;;   THYROIDECTOMY     TUBAL LIGATION     UPPER GASTROINTESTINAL ENDOSCOPY      Social History   Socioeconomic History   Marital status: Widowed    Spouse name: Not on file   Number of children: 4   Years of education: 29   Highest education level: 12th grade  Occupational History   Occupation: Retired    Comment: Conservation officer, historic buildings  Tobacco Use   Smoking status: Never   Smokeless tobacco: Never   Tobacco comments:    Never smoke 05/15/21  Vaping Use   Vaping Use: Never used  Substance and Sexual Activity   Alcohol use: No   Drug use: No   Sexual activity: Not Currently  Other Topics Concern   Not  on file  Social History Narrative   Lives alone - one level   Social Determinants of Health   Financial Resource Strain: Low Risk  (09/18/2021)   Overall Financial Resource Strain (CARDIA)    Difficulty of Paying Living Expenses: Not hard at all  Food Insecurity: No Food Insecurity (09/18/2021)   Hunger Vital Sign    Worried About Running Out of Food in the Last Year: Never true    [place] in the Last Year: Never true  Transportation Needs: No Transportation Needs (09/18/2021)   PRAPARE - Hydrologist (Medical): No    Lack of Transportation (Non-Medical): No  Physical Activity: Insufficiently Active (09/18/2021)   Exercise Vital Sign    Days of Exercise per Week: 4 days    Minutes of Exercise per Session: 30 min  Stress: No Stress Concern Present (09/18/2021)   [employer]    Feeling of Stress : Only a little  Social Connections: Socially Isolated (09/18/2021)   Social Connection and Isolation Panel [NHANES]    Frequency of Communication with Friends and Family: More than three times a week    Frequency of Social Gatherings with Friends and Family: More than three times a week    Attends Religious Services: Never    Marine scientist or Organizations: No    Attends Archivist Meetings: Never    Marital Status: Widowed  Intimate Partner Violence: Not At Risk (09/18/2021)   Humiliation, Afraid, Rape, and Kick questionnaire    Fear of Current or Ex-Partner: No    Emotionally Abused: No    Physically Abused: No    Sexually Abused: No     Allergies  Allergen Reactions   Ace Inhibitors Cough   Feldene [Piroxicam]     REACTION: RASH TO HANDS   Lexapro [Escitalopram]    Meloxicam Nausea And Vomiting   Prevnar [Pneumococcal 13-Val Conj Vacc]    Statins Other (See Comments)    myalgia   Sulfa Antibiotics     Rash    Zithromax [Azithromycin Dihydrate] Itching      Outpatient Medications Prior to Visit  Medication Sig Dispense Refill   acetaminophen (TYLENOL) 500 MG tablet Take 500 mg by mouth every 6 (six) hours as needed for moderate pain, headache or fever.     bisacodyl (DULCOLAX) 5 MG EC tablet Take 5 mg by mouth daily as needed for moderate constipation (usually twice per week).     Blood Glucose Monitoring Suppl (ONETOUCH VERIO) w/Device KIT Check blood sugars daily Dx E11.9 1 kit  0   [facility] MELATONIN 10 MG CAPS TAKE 1 CAPSULE BY MOUTH EVERY DAY AT BEDTIME AS NEEDED (Patient taking differently: Take 1 capsule by mouth at bedtime.) 90 capsule 1   diltiazem (CARDIZEM) 90 MG tablet TAKE 1 TABLET (90 MG TOTAL) BY MOUTH 3 (THREE) TIMES DAILY. 270 tablet 1   doxycycline (VIBRA-TABS) 100 MG tablet Take 1 tablet (100 mg total) by mouth 2 (two) times daily. 1 po bid 20 tablet 0   ELIQUIS 5 MG TABS tablet TAKE 1 TABLET BY MOUTH TWICE A DAY 180 tablet 1   feeding supplement (ENSURE ENLIVE / ENSURE PLUS) LIQD Take 237 mLs by mouth 3 (three) times daily between meals. 237 mL 12   furosemide (LASIX) 20 MG tablet Take 1 tablet (20 mg total) by mouth daily. 90 tablet 1   glucose blood (ONETOUCH VERIO) test strip CHECK BLOOD SUGARS DAILY E11.9 100 strip 3   Lancet Devices (ONETOUCH DELICA PLUS LANCING) MISC 1 Device by Does not apply route daily. 1 each 0   Lancets (ONETOUCH DELICA PLUS [id number]) MISC USE TO CHECK BLOOD SUGARS DAILY DX E11.9 100 each 3   levothyroxine (SYNTHROID) 88 MCG tablet Take 1 tablet (88 mcg total) by mouth daily. 90 tablet 1   metoprolol tartrate (LOPRESSOR) 100 MG tablet TAKE 1.5 TABLETS BY MOUTH 2 TIMES DAILY. (Patient taking differently: Take 100 mg by mouth See admin instructions. Take 1.5 tablet by mouth twice a day) 270 tablet 1   Multiple Vitamin (MULTIVITAMIN) tablet Take 1 tablet by mouth daily.     nitroGLYCERIN (NITROSTAT) 0.4 MG SL tablet Place 1 tablet (0.4 mg total) under the tongue every 5 (five) minutes as needed for chest  pain. 25 tablet 1   nystatin (MYCOSTATIN/NYSTOP) powder Apply 1 application. topically 3 (three) times daily. 15 g 1   omeprazole (PRILOSEC) 20 MG capsule Take 1 capsule (20 mg total) by mouth daily. 90 capsule 1   ondansetron (ZOFRAN-ODT) 4 MG disintegrating tablet Take 1 tablet (4 mg total) by mouth every 8 (eight) hours as needed for nausea or vomiting. 20 tablet 1   oxyCODONE-acetaminophen (PERCOCET/ROXICET) 5-325 MG tablet Take 1 tablet by mouth every 6 (six) hours as needed for severe pain. 15 tablet 0   polyethylene glycol (MIRALAX / GLYCOLAX) 17 g packet Take 17 g by mouth daily as needed for mild constipation.     promethazine-dextromethorphan (PROMETHAZINE-DM) 6.25-15 MG/5ML syrup Take 5 mLs by mouth 4 (four) times daily as needed for cough. (Patient taking differently: Take 5 mLs by mouth every evening.) 118 mL 2   sertraline (ZOLOFT) 50 MG tablet Take 1 tablet (50 mg total) by mouth daily. 90 tablet 1   No facility-administered medications prior to visit.    Review of Systems  Constitutional:  Negative for chills, fever, malaise/fatigue and weight loss.  HENT:  Negative for hearing loss, sore throat and tinnitus.   Eyes:  Negative for blurred vision and double vision.  Respiratory:  Positive for shortness of breath. Negative for cough, hemoptysis, sputum production, wheezing and stridor.   Cardiovascular:  Negative for chest pain, palpitations, orthopnea, leg swelling and PND.  Gastrointestinal:  Negative for abdominal pain, constipation, diarrhea, heartburn, nausea and vomiting.  Genitourinary:  Negative for dysuria, hematuria and urgency.  Musculoskeletal:  Positive for falls. Negative for joint pain and myalgias.  Skin:  Negative for itching and rash.  Neurological:  Negative for dizziness, tingling, weakness and headaches.  Endo/Heme/Allergies:  Negative for environmental allergies. Does not bruise/bleed easily.  Psychiatric/Behavioral:  Negative for depression. The patient is  not nervous/anxious and does not have insomnia.   All other systems reviewed and are negative.    Objective:  Physical Exam Vitals reviewed.  Constitutional:      General: She is not in acute distress.    Appearance: She is well-developed.     Comments: Elderly female  HENT:     Head: Normocephalic and atraumatic.  Eyes:     General: No scleral icterus.    Conjunctiva/sclera: Conjunctivae normal.     Pupils: Pupils are equal, round, and reactive to light.  Neck:     Vascular: No JVD.     Trachea: No tracheal deviation.  Cardiovascular:     Rate and Rhythm: Normal rate and regular rhythm.     Heart sounds: Normal heart sounds. No murmur heard. Pulmonary:     Effort: Pulmonary effort is normal. No tachypnea, accessory muscle usage or respiratory distress.     Breath sounds: No stridor. No wheezing, rhonchi or rales.  Abdominal:     General: There is no distension.     Palpations: Abdomen is soft.     Tenderness: There is no abdominal tenderness.  Musculoskeletal:        General: No tenderness.     Cervical back: Neck supple.  Lymphadenopathy:     Cervical: No cervical adenopathy.  Skin:    General: Skin is warm and dry.     Capillary Refill: Capillary refill takes less than 2 seconds.     Findings: No rash.  Neurological:     Mental Status: She is alert and oriented to person, place, and time.     Comments: Aphasia  Psychiatric:        Behavior: Behavior normal.      Vitals:   10/03/21 1103  BP: 108/60  Pulse: 68  SpO2: 96%  Weight: 157 lb (71.2 kg)  Height: '5\' 3"'  (1.6 m)   96% on RA BMI Readings from Last 3 Encounters:  10/03/21 27.81 kg/m  09/26/21 27.81 kg/m  09/24/21 27.35 kg/m   Wt Readings from Last 3 Encounters:  10/03/21 157 lb (71.2 kg)  09/26/21 157 lb (71.2 kg)  09/24/21 154 lb 6.2 oz (70 kg)     CBC    Component Value Date/Time   WBC 10.4 09/24/2021 1210   RBC 4.60 09/24/2021 1210   HGB 12.9 09/24/2021 1210   HGB 14.2 08/19/2021  1149   HCT 40.8 09/24/2021 1210   HCT 43.4 08/19/2021 1149   PLT 305 09/24/2021 1210   PLT 297 08/19/2021 1149   MCV 88.7 09/24/2021 1210   MCV 85 08/19/2021 1149   MCH 28.0 09/24/2021 1210   MCHC 31.6 09/24/2021 1210   RDW 15.7 (H) 09/24/2021 1210   RDW 14.9 08/19/2021 1149   LYMPHSABS 1.6 08/19/2021 1149   MONOABS 1.1 (H) 02/22/2021 1100   EOSABS 0.6 (H) 08/19/2021 1149   BASOSABS 0.1 08/19/2021 1149     Chest Imaging: CT scan of the chest August 2023: Some evidence of pleural thickening, possible hilar and mediastinal adenopathy.  She has consolidation of the right lower lobe. The patient's images have been independently reviewed by me.    Pulmonary Functions Testing Results:     No data to display          FeNO:   Pathology:   Echocardiogram:   Heart Catheterization:     Assessment & Plan:     ICD-10-CM   1. Consolidation lung ([place])  J18.1 NM PET Image Initial (PI) Skull Base To Thigh (F-18 FDG)    2. Adenopathy  R59.9 NM PET Image Initial (PI) Skull Base To Thigh (F-18 FDG)    3. Pleural thickening  J92.9       Discussion:  This is a 86 year old female history of stroke, A-fib on Eliquis, recent CT scan of the chest with pleural thickening some adenopathy and consolidation in the right lower lobe.  I am not totally convinced that she has recurrence of malignancy that was from 1998.  She is a lifelong non-smoker.  CT imaging to me is more consistent with an aspiration pneumonia with reactive adenopathy.  I think the pleural thickening and the abnormality seen there is likely postsurgical from her previous resection.  Plan: I think the next best step for evaluation is consideration for nuclear medicine pet imaging before we would proceed with considerations for biopsy. I am not sure that she is a great candidate for general anesthesia due to her stroke history and stopping of anticoagulation as well as her other medical comorbidities. She does use a  wheelchair she is able to ambulate but limited. I think we would need to have a real heart-to-heart discussion before consideration of general anesthesia for bronchoscopy and biopsy to confirm malignancy pending upon the PET scan results. I would like her to complete her antibiotic course which ends this Saturday and then she will have 4 weeks to have her pneumonia clear off of imaging with a repeat PET scan image sometime in mid September. We will have her follow-up with me or SG, NP after the PET scan to talk about next steps   Current Outpatient Medications:    acetaminophen (TYLENOL) 500 MG tablet, Take 500 mg by mouth every 6 (six) hours as needed for moderate pain, headache or fever., Disp: , Rfl:    bisacodyl (DULCOLAX) 5 MG EC tablet, Take 5 mg by mouth daily as needed for moderate constipation (usually twice per week)., Disp: , Rfl:    Blood Glucose Monitoring Suppl (ONETOUCH VERIO) w/Device KIT, Check blood sugars daily Dx E11.9, Disp: 1 kit, Rfl: 0   [facility] MELATONIN 10 MG CAPS, TAKE 1 CAPSULE BY MOUTH EVERY DAY AT BEDTIME AS NEEDED (Patient taking differently: Take 1 capsule by mouth at bedtime.), Disp: 90 capsule, Rfl: 1   diltiazem (CARDIZEM) 90 MG tablet, TAKE 1 TABLET (90 MG TOTAL) BY MOUTH 3 (THREE) TIMES DAILY., Disp: 270 tablet, Rfl: 1   doxycycline (VIBRA-TABS) 100 MG tablet, Take 1 tablet (100 mg total) by mouth 2 (two) times daily. 1 po bid, Disp: 20 tablet, Rfl: 0   ELIQUIS 5 MG TABS tablet, TAKE 1 TABLET BY MOUTH TWICE A DAY, Disp: 180 tablet, Rfl: 1   feeding supplement (ENSURE ENLIVE / ENSURE PLUS) LIQD, Take 237 mLs by mouth 3 (three) times daily between meals., Disp: 237 mL, Rfl: 12   furosemide (LASIX) 20 MG tablet, Take 1 tablet (20 mg total) by mouth daily., Disp: 90 tablet, Rfl: 1   glucose blood (ONETOUCH VERIO) test strip, CHECK BLOOD SUGARS DAILY E11.9, Disp: 100 strip, Rfl: 3   Lancet Devices (ONETOUCH DELICA PLUS LANCING) MISC, 1 Device by Does not apply route  daily., Disp: 1 each, Rfl: 0   Lancets (ONETOUCH DELICA PLUS [id number]) MISC, USE TO CHECK BLOOD SUGARS DAILY DX E11.9, Disp: 100 each, Rfl: 3   levothyroxine (SYNTHROID) 88 MCG tablet, Take 1 tablet (88 mcg total) by mouth daily., Disp: 90 tablet, Rfl: 1  metoprolol tartrate (LOPRESSOR) 100 MG tablet, TAKE 1.5 TABLETS BY MOUTH 2 TIMES DAILY. (Patient taking differently: Take 100 mg by mouth See admin instructions. Take 1.5 tablet by mouth twice a day), Disp: 270 tablet, Rfl: 1   Multiple Vitamin (MULTIVITAMIN) tablet, Take 1 tablet by mouth daily., Disp: , Rfl:    nitroGLYCERIN (NITROSTAT) 0.4 MG SL tablet, Place 1 tablet (0.4 mg total) under the tongue every 5 (five) minutes as needed for chest pain., Disp: 25 tablet, Rfl: 1   nystatin (MYCOSTATIN/NYSTOP) powder, Apply 1 application. topically 3 (three) times daily., Disp: 15 g, Rfl: 1   omeprazole (PRILOSEC) 20 MG capsule, Take 1 capsule (20 mg total) by mouth daily., Disp: 90 capsule, Rfl: 1   ondansetron (ZOFRAN-ODT) 4 MG disintegrating tablet, Take 1 tablet (4 mg total) by mouth every 8 (eight) hours as needed for nausea or vomiting., Disp: 20 tablet, Rfl: 1   oxyCODONE-acetaminophen (PERCOCET/ROXICET) 5-325 MG tablet, Take 1 tablet by mouth every 6 (six) hours as needed for severe pain., Disp: 15 tablet, Rfl: 0   polyethylene glycol (MIRALAX / GLYCOLAX) 17 g packet, Take 17 g by mouth daily as needed for mild constipation., Disp: , Rfl:    promethazine-dextromethorphan (PROMETHAZINE-DM) 6.25-15 MG/5ML syrup, Take 5 mLs by mouth 4 (four) times daily as needed for cough. (Patient taking differently: Take 5 mLs by mouth every evening.), Disp: 118 mL, Rfl: 2   sertraline (ZOLOFT) 50 MG tablet, Take 1 tablet (50 mg total) by mouth daily., Disp: 90 tablet, Rfl: 1  I spent 63 minutes dedicated to the care of this patient on the date of this encounter to include pre-visit review of records, face-to-face time with the patient discussing conditions  above, post visit ordering of testing, clinical documentation with the electronic health record, making appropriate referrals as documented, and communicating necessary findings to members of the patients care team.   Garner Nash, DO [facility] 10/03/2021 11:50 AM

## 2021-10-03 NOTE — Patient Instructions (Signed)
Thank you for visiting Dr. Valeta Harms at [facility]. Today we recommend the following:  Orders Placed This Encounter  Procedures   NM PET Image Initial (PI) Skull Base To Thigh (F-18 FDG)   See [place] after the PET Scan   Return in about 4 weeks (around 10/31/2021) for with Eric Form, NP.    Please do your part to reduce the spread of COVID-19.

## 2021-10-07 LAB — HM DIABETES EYE EXAM

## 2021-10-08 DIAGNOSIS — S32050A Wedge compression fracture of fifth lumbar vertebra, initial encounter for closed fracture: Secondary | ICD-10-CM | POA: Diagnosis not present

## 2021-10-08 NOTE — Addendum Note (Signed)
Addended by: Rolena Infante on: 10/08/2021 04:42 PM   Modules accepted: Orders

## 2021-10-08 NOTE — Addendum Note (Signed)
Addended by: Antonietta Barcelona D on: 10/08/2021 04:23 PM   Modules accepted: Orders

## 2021-10-09 DIAGNOSIS — E119 Type 2 diabetes mellitus without complications: Secondary | ICD-10-CM | POA: Diagnosis not present

## 2021-10-09 DIAGNOSIS — J9601 Acute respiratory failure with hypoxia: Secondary | ICD-10-CM | POA: Diagnosis not present

## 2021-10-09 DIAGNOSIS — Z7901 Long term (current) use of anticoagulants: Secondary | ICD-10-CM | POA: Diagnosis not present

## 2021-10-09 DIAGNOSIS — Z9842 Cataract extraction status, left eye: Secondary | ICD-10-CM | POA: Diagnosis not present

## 2021-10-09 DIAGNOSIS — E559 Vitamin D deficiency, unspecified: Secondary | ICD-10-CM | POA: Diagnosis not present

## 2021-10-09 DIAGNOSIS — M1990 Unspecified osteoarthritis, unspecified site: Secondary | ICD-10-CM | POA: Diagnosis not present

## 2021-10-09 DIAGNOSIS — K5900 Constipation, unspecified: Secondary | ICD-10-CM | POA: Diagnosis not present

## 2021-10-09 DIAGNOSIS — E785 Hyperlipidemia, unspecified: Secondary | ICD-10-CM | POA: Diagnosis not present

## 2021-10-09 DIAGNOSIS — Z6828 Body mass index (BMI) 28.0-28.9, adult: Secondary | ICD-10-CM | POA: Diagnosis not present

## 2021-10-09 DIAGNOSIS — R1310 Dysphagia, unspecified: Secondary | ICD-10-CM | POA: Diagnosis not present

## 2021-10-09 DIAGNOSIS — R69 Illness, unspecified: Secondary | ICD-10-CM | POA: Diagnosis not present

## 2021-10-09 DIAGNOSIS — I4891 Unspecified atrial fibrillation: Secondary | ICD-10-CM | POA: Diagnosis not present

## 2021-10-09 DIAGNOSIS — Z9841 Cataract extraction status, right eye: Secondary | ICD-10-CM | POA: Diagnosis not present

## 2021-10-09 DIAGNOSIS — Z85118 Personal history of other malignant neoplasm of bronchus and lung: Secondary | ICD-10-CM | POA: Diagnosis not present

## 2021-10-09 DIAGNOSIS — I10 Essential (primary) hypertension: Secondary | ICD-10-CM | POA: Diagnosis not present

## 2021-10-09 DIAGNOSIS — Z9181 History of falling: Secondary | ICD-10-CM | POA: Diagnosis not present

## 2021-10-09 DIAGNOSIS — E039 Hypothyroidism, unspecified: Secondary | ICD-10-CM | POA: Diagnosis not present

## 2021-10-09 DIAGNOSIS — K219 Gastro-esophageal reflux disease without esophagitis: Secondary | ICD-10-CM | POA: Diagnosis not present

## 2021-10-09 DIAGNOSIS — E872 Acidosis, unspecified: Secondary | ICD-10-CM | POA: Diagnosis not present

## 2021-10-09 DIAGNOSIS — M8580 Other specified disorders of bone density and structure, unspecified site: Secondary | ICD-10-CM | POA: Diagnosis not present

## 2021-10-09 DIAGNOSIS — K589 Irritable bowel syndrome without diarrhea: Secondary | ICD-10-CM | POA: Diagnosis not present

## 2021-10-10 DIAGNOSIS — E119 Type 2 diabetes mellitus without complications: Secondary | ICD-10-CM | POA: Diagnosis not present

## 2021-10-10 DIAGNOSIS — K5900 Constipation, unspecified: Secondary | ICD-10-CM | POA: Diagnosis not present

## 2021-10-10 DIAGNOSIS — K219 Gastro-esophageal reflux disease without esophagitis: Secondary | ICD-10-CM | POA: Diagnosis not present

## 2021-10-10 DIAGNOSIS — I4891 Unspecified atrial fibrillation: Secondary | ICD-10-CM | POA: Diagnosis not present

## 2021-10-10 DIAGNOSIS — Z6828 Body mass index (BMI) 28.0-28.9, adult: Secondary | ICD-10-CM | POA: Diagnosis not present

## 2021-10-10 DIAGNOSIS — R1310 Dysphagia, unspecified: Secondary | ICD-10-CM | POA: Diagnosis not present

## 2021-10-10 DIAGNOSIS — E872 Acidosis, unspecified: Secondary | ICD-10-CM | POA: Diagnosis not present

## 2021-10-10 DIAGNOSIS — M8580 Other specified disorders of bone density and structure, unspecified site: Secondary | ICD-10-CM | POA: Diagnosis not present

## 2021-10-10 DIAGNOSIS — E039 Hypothyroidism, unspecified: Secondary | ICD-10-CM | POA: Diagnosis not present

## 2021-10-10 DIAGNOSIS — E785 Hyperlipidemia, unspecified: Secondary | ICD-10-CM | POA: Diagnosis not present

## 2021-10-10 DIAGNOSIS — Z85118 Personal history of other malignant neoplasm of bronchus and lung: Secondary | ICD-10-CM | POA: Diagnosis not present

## 2021-10-10 DIAGNOSIS — M1990 Unspecified osteoarthritis, unspecified site: Secondary | ICD-10-CM | POA: Diagnosis not present

## 2021-10-10 DIAGNOSIS — R69 Illness, unspecified: Secondary | ICD-10-CM | POA: Diagnosis not present

## 2021-10-10 DIAGNOSIS — I10 Essential (primary) hypertension: Secondary | ICD-10-CM | POA: Diagnosis not present

## 2021-10-10 DIAGNOSIS — Z9842 Cataract extraction status, left eye: Secondary | ICD-10-CM | POA: Diagnosis not present

## 2021-10-10 DIAGNOSIS — Z9841 Cataract extraction status, right eye: Secondary | ICD-10-CM | POA: Diagnosis not present

## 2021-10-10 DIAGNOSIS — K589 Irritable bowel syndrome without diarrhea: Secondary | ICD-10-CM | POA: Diagnosis not present

## 2021-10-10 DIAGNOSIS — Z7901 Long term (current) use of anticoagulants: Secondary | ICD-10-CM | POA: Diagnosis not present

## 2021-10-10 DIAGNOSIS — Z9181 History of falling: Secondary | ICD-10-CM | POA: Diagnosis not present

## 2021-10-10 DIAGNOSIS — J9601 Acute respiratory failure with hypoxia: Secondary | ICD-10-CM | POA: Diagnosis not present

## 2021-10-10 DIAGNOSIS — E559 Vitamin D deficiency, unspecified: Secondary | ICD-10-CM | POA: Diagnosis not present

## 2021-10-10 NOTE — Telephone Encounter (Signed)
Information faxed today to [facility], had to wait on additional information and reprint orders.

## 2021-10-14 DIAGNOSIS — E559 Vitamin D deficiency, unspecified: Secondary | ICD-10-CM | POA: Diagnosis not present

## 2021-10-14 DIAGNOSIS — J690 Pneumonitis due to inhalation of food and vomit: Secondary | ICD-10-CM | POA: Diagnosis not present

## 2021-10-14 DIAGNOSIS — E039 Hypothyroidism, unspecified: Secondary | ICD-10-CM | POA: Diagnosis not present

## 2021-10-14 DIAGNOSIS — E785 Hyperlipidemia, unspecified: Secondary | ICD-10-CM | POA: Diagnosis not present

## 2021-10-14 DIAGNOSIS — E119 Type 2 diabetes mellitus without complications: Secondary | ICD-10-CM | POA: Diagnosis not present

## 2021-10-14 DIAGNOSIS — I4891 Unspecified atrial fibrillation: Secondary | ICD-10-CM | POA: Diagnosis not present

## 2021-10-14 DIAGNOSIS — R1310 Dysphagia, unspecified: Secondary | ICD-10-CM | POA: Diagnosis not present

## 2021-10-14 DIAGNOSIS — K219 Gastro-esophageal reflux disease without esophagitis: Secondary | ICD-10-CM | POA: Diagnosis not present

## 2021-10-14 DIAGNOSIS — I10 Essential (primary) hypertension: Secondary | ICD-10-CM | POA: Diagnosis not present

## 2021-10-14 DIAGNOSIS — Z9841 Cataract extraction status, right eye: Secondary | ICD-10-CM | POA: Diagnosis not present

## 2021-10-14 DIAGNOSIS — K5900 Constipation, unspecified: Secondary | ICD-10-CM | POA: Diagnosis not present

## 2021-10-14 DIAGNOSIS — M8580 Other specified disorders of bone density and structure, unspecified site: Secondary | ICD-10-CM | POA: Diagnosis not present

## 2021-10-14 DIAGNOSIS — M1990 Unspecified osteoarthritis, unspecified site: Secondary | ICD-10-CM | POA: Diagnosis not present

## 2021-10-14 DIAGNOSIS — R918 Other nonspecific abnormal finding of lung field: Secondary | ICD-10-CM | POA: Diagnosis not present

## 2021-10-14 DIAGNOSIS — K589 Irritable bowel syndrome without diarrhea: Secondary | ICD-10-CM | POA: Diagnosis not present

## 2021-10-14 DIAGNOSIS — Z9842 Cataract extraction status, left eye: Secondary | ICD-10-CM | POA: Diagnosis not present

## 2021-10-14 DIAGNOSIS — E872 Acidosis, unspecified: Secondary | ICD-10-CM | POA: Diagnosis not present

## 2021-10-14 DIAGNOSIS — J9601 Acute respiratory failure with hypoxia: Secondary | ICD-10-CM | POA: Diagnosis not present

## 2021-10-14 DIAGNOSIS — Z6828 Body mass index (BMI) 28.0-28.9, adult: Secondary | ICD-10-CM | POA: Diagnosis not present

## 2021-10-14 DIAGNOSIS — Z9181 History of falling: Secondary | ICD-10-CM | POA: Diagnosis not present

## 2021-10-14 DIAGNOSIS — R2681 Unsteadiness on feet: Secondary | ICD-10-CM | POA: Diagnosis not present

## 2021-10-14 DIAGNOSIS — Z85118 Personal history of other malignant neoplasm of bronchus and lung: Secondary | ICD-10-CM | POA: Diagnosis not present

## 2021-10-14 DIAGNOSIS — R69 Illness, unspecified: Secondary | ICD-10-CM | POA: Diagnosis not present

## 2021-10-14 DIAGNOSIS — Z7901 Long term (current) use of anticoagulants: Secondary | ICD-10-CM | POA: Diagnosis not present

## 2021-10-16 DIAGNOSIS — K219 Gastro-esophageal reflux disease without esophagitis: Secondary | ICD-10-CM | POA: Diagnosis not present

## 2021-10-16 DIAGNOSIS — K5900 Constipation, unspecified: Secondary | ICD-10-CM | POA: Diagnosis not present

## 2021-10-16 DIAGNOSIS — M1990 Unspecified osteoarthritis, unspecified site: Secondary | ICD-10-CM | POA: Diagnosis not present

## 2021-10-16 DIAGNOSIS — Z9841 Cataract extraction status, right eye: Secondary | ICD-10-CM | POA: Diagnosis not present

## 2021-10-16 DIAGNOSIS — Z9842 Cataract extraction status, left eye: Secondary | ICD-10-CM | POA: Diagnosis not present

## 2021-10-16 DIAGNOSIS — I4891 Unspecified atrial fibrillation: Secondary | ICD-10-CM | POA: Diagnosis not present

## 2021-10-16 DIAGNOSIS — E785 Hyperlipidemia, unspecified: Secondary | ICD-10-CM | POA: Diagnosis not present

## 2021-10-16 DIAGNOSIS — M8580 Other specified disorders of bone density and structure, unspecified site: Secondary | ICD-10-CM | POA: Diagnosis not present

## 2021-10-16 DIAGNOSIS — Z85118 Personal history of other malignant neoplasm of bronchus and lung: Secondary | ICD-10-CM | POA: Diagnosis not present

## 2021-10-16 DIAGNOSIS — Z7901 Long term (current) use of anticoagulants: Secondary | ICD-10-CM | POA: Diagnosis not present

## 2021-10-16 DIAGNOSIS — R1310 Dysphagia, unspecified: Secondary | ICD-10-CM | POA: Diagnosis not present

## 2021-10-16 DIAGNOSIS — E119 Type 2 diabetes mellitus without complications: Secondary | ICD-10-CM | POA: Diagnosis not present

## 2021-10-16 DIAGNOSIS — J9601 Acute respiratory failure with hypoxia: Secondary | ICD-10-CM | POA: Diagnosis not present

## 2021-10-16 DIAGNOSIS — E872 Acidosis, unspecified: Secondary | ICD-10-CM | POA: Diagnosis not present

## 2021-10-16 DIAGNOSIS — I10 Essential (primary) hypertension: Secondary | ICD-10-CM | POA: Diagnosis not present

## 2021-10-16 DIAGNOSIS — E559 Vitamin D deficiency, unspecified: Secondary | ICD-10-CM | POA: Diagnosis not present

## 2021-10-16 DIAGNOSIS — E039 Hypothyroidism, unspecified: Secondary | ICD-10-CM | POA: Diagnosis not present

## 2021-10-16 DIAGNOSIS — Z6828 Body mass index (BMI) 28.0-28.9, adult: Secondary | ICD-10-CM | POA: Diagnosis not present

## 2021-10-16 DIAGNOSIS — K589 Irritable bowel syndrome without diarrhea: Secondary | ICD-10-CM | POA: Diagnosis not present

## 2021-10-16 DIAGNOSIS — Z9181 History of falling: Secondary | ICD-10-CM | POA: Diagnosis not present

## 2021-10-16 DIAGNOSIS — R69 Illness, unspecified: Secondary | ICD-10-CM | POA: Diagnosis not present

## 2021-10-17 ENCOUNTER — Telehealth: Payer: Self-pay | Admitting: Nurse Practitioner

## 2021-10-17 NOTE — Telephone Encounter (Signed)
Daughter calling for pt asking for a wrist monitor to track bp. Daughter says that pt has a [employer] techn[employer] book that says it will cover the monitor for free. [facility]  Daughter said she will call [employer] to make sure it is ok [facility] or will need to use [facility]. Daughter aware that MMM is off and will be back on Thursday of next week.

## 2021-10-18 DIAGNOSIS — I10 Essential (primary) hypertension: Secondary | ICD-10-CM | POA: Diagnosis not present

## 2021-10-18 DIAGNOSIS — K589 Irritable bowel syndrome without diarrhea: Secondary | ICD-10-CM | POA: Diagnosis not present

## 2021-10-18 DIAGNOSIS — M1990 Unspecified osteoarthritis, unspecified site: Secondary | ICD-10-CM | POA: Diagnosis not present

## 2021-10-18 DIAGNOSIS — Z6828 Body mass index (BMI) 28.0-28.9, adult: Secondary | ICD-10-CM | POA: Diagnosis not present

## 2021-10-18 DIAGNOSIS — Z7901 Long term (current) use of anticoagulants: Secondary | ICD-10-CM | POA: Diagnosis not present

## 2021-10-18 DIAGNOSIS — R69 Illness, unspecified: Secondary | ICD-10-CM | POA: Diagnosis not present

## 2021-10-18 DIAGNOSIS — E559 Vitamin D deficiency, unspecified: Secondary | ICD-10-CM | POA: Diagnosis not present

## 2021-10-18 DIAGNOSIS — Z9842 Cataract extraction status, left eye: Secondary | ICD-10-CM | POA: Diagnosis not present

## 2021-10-18 DIAGNOSIS — E785 Hyperlipidemia, unspecified: Secondary | ICD-10-CM | POA: Diagnosis not present

## 2021-10-18 DIAGNOSIS — J9601 Acute respiratory failure with hypoxia: Secondary | ICD-10-CM | POA: Diagnosis not present

## 2021-10-18 DIAGNOSIS — E039 Hypothyroidism, unspecified: Secondary | ICD-10-CM | POA: Diagnosis not present

## 2021-10-18 DIAGNOSIS — R1310 Dysphagia, unspecified: Secondary | ICD-10-CM | POA: Diagnosis not present

## 2021-10-18 DIAGNOSIS — K219 Gastro-esophageal reflux disease without esophagitis: Secondary | ICD-10-CM | POA: Diagnosis not present

## 2021-10-18 DIAGNOSIS — K5900 Constipation, unspecified: Secondary | ICD-10-CM | POA: Diagnosis not present

## 2021-10-18 DIAGNOSIS — I4891 Unspecified atrial fibrillation: Secondary | ICD-10-CM | POA: Diagnosis not present

## 2021-10-18 DIAGNOSIS — Z9181 History of falling: Secondary | ICD-10-CM | POA: Diagnosis not present

## 2021-10-18 DIAGNOSIS — E872 Acidosis, unspecified: Secondary | ICD-10-CM | POA: Diagnosis not present

## 2021-10-18 DIAGNOSIS — M8580 Other specified disorders of bone density and structure, unspecified site: Secondary | ICD-10-CM | POA: Diagnosis not present

## 2021-10-18 DIAGNOSIS — Z9841 Cataract extraction status, right eye: Secondary | ICD-10-CM | POA: Diagnosis not present

## 2021-10-18 DIAGNOSIS — Z85118 Personal history of other malignant neoplasm of bronchus and lung: Secondary | ICD-10-CM | POA: Diagnosis not present

## 2021-10-18 DIAGNOSIS — E119 Type 2 diabetes mellitus without complications: Secondary | ICD-10-CM | POA: Diagnosis not present

## 2021-10-22 NOTE — Telephone Encounter (Signed)
Please order for patient

## 2021-10-24 ENCOUNTER — Ambulatory Visit: Payer: Medicare HMO | Admitting: Nurse Practitioner

## 2021-10-25 ENCOUNTER — Other Ambulatory Visit: Payer: Self-pay | Admitting: Nurse Practitioner

## 2021-10-25 DIAGNOSIS — R609 Edema, unspecified: Secondary | ICD-10-CM

## 2021-10-25 DIAGNOSIS — R1319 Other dysphagia: Secondary | ICD-10-CM

## 2021-10-25 DIAGNOSIS — F3341 Major depressive disorder, recurrent, in partial remission: Secondary | ICD-10-CM

## 2021-10-28 NOTE — Telephone Encounter (Signed)
Contacted daughter and she stated that patient had already received monitor.

## 2021-10-31 ENCOUNTER — Encounter (HOSPITAL_COMMUNITY)
Admission: RE | Admit: 2021-10-31 | Discharge: 2021-10-31 | Disposition: A | Discharge status: Home / Self Care | Payer: Medicare HMO | Source: Ambulatory Visit | Attending: Pulmonary Disease | Admitting: Pulmonary Disease

## 2021-10-31 DIAGNOSIS — C7951 Secondary malignant neoplasm of bone: Secondary | ICD-10-CM | POA: Diagnosis not present

## 2021-10-31 DIAGNOSIS — R599 Enlarged lymph nodes, unspecified: Secondary | ICD-10-CM | POA: Diagnosis not present

## 2021-10-31 DIAGNOSIS — J181 Lobar pneumonia, unspecified organism: Secondary | ICD-10-CM | POA: Insufficient documentation

## 2021-10-31 DIAGNOSIS — Z8709 Personal history of other diseases of the respiratory system: Secondary | ICD-10-CM | POA: Diagnosis not present

## 2021-10-31 LAB — GLUCOSE, CAPILLARY: Glucose-Capillary: 130 mg/dL — ABNORMAL HIGH (ref 70–99)

## 2021-10-31 MED ORDER — FLUDEOXYGLUCOSE F - 18 (FDG) INJECTION
7.8500 | Freq: Once | INTRAVENOUS | Status: AC
  Administered 2021-10-31: 7.8 via INTRAVENOUS

## 2021-11-04 NOTE — Progress Notes (Signed)
Veronica Paul, you are seeing her tomorrow. I think she is going to need a hepatic biopsy. IF she declines biopsy or declines eval for oncology for treatment then she just needs a hospice referral.   Thanks,  [place], DO [facility] 11/04/2021 4:06 PM

## 2021-11-05 ENCOUNTER — Encounter: Payer: Self-pay | Admitting: Acute Care

## 2021-11-05 ENCOUNTER — Other Ambulatory Visit: Payer: Self-pay | Admitting: Acute Care

## 2021-11-05 ENCOUNTER — Other Ambulatory Visit: Payer: Self-pay | Admitting: Pediatrics

## 2021-11-05 ENCOUNTER — Ambulatory Visit (INDEPENDENT_AMBULATORY_CARE_PROVIDER_SITE_OTHER): Payer: Medicare HMO | Admitting: Acute Care

## 2021-11-05 ENCOUNTER — Other Ambulatory Visit: Payer: Self-pay

## 2021-11-05 VITALS — BP 108/62 | HR 64 | Temp 97.8°F | Ht 63.0 in | Wt 157.6 lb

## 2021-11-05 DIAGNOSIS — R918 Other nonspecific abnormal finding of lung field: Secondary | ICD-10-CM | POA: Diagnosis not present

## 2021-11-05 DIAGNOSIS — C3491 Malignant neoplasm of unspecified part of right bronchus or lung: Secondary | ICD-10-CM | POA: Diagnosis not present

## 2021-11-05 DIAGNOSIS — J441 Chronic obstructive pulmonary disease with (acute) exacerbation: Secondary | ICD-10-CM

## 2021-11-05 MED ORDER — DOXYCYCLINE HYCLATE 100 MG PO TABS: 100.0000 mg | ORAL_TABLET | Freq: Two times a day (BID) | ORAL | 0 refills | Status: AC

## 2021-11-05 NOTE — Patient Instructions (Addendum)
It is good to see you today. The PET scan shows areas of concern for metastasis of the previous lung cancer  in the liver, bones and some nodules. Options , as we discussed , are for biopsy of the liver lesion and then treatment based on findings. I will refer you to radiation oncology, medical oncology, and interventional radiology for more information on both the procedure and treatment options.  If after you gather information and decide you would prefer hospice referral, let us know, and we are happy to place that referral.  We will treat new cough and increased secretions with Doxycycline, 1 tablet twice daily for 7 days. Avoid the sun while on antibiotic.  Follow up in 1 month with Judson Roch NP to ensure cough is getting better.  Please contact office for sooner follow up if symptoms do not improve or worsen or seek emergency care

## 2021-11-05 NOTE — Progress Notes (Unsigned)
History of Present Illness Veronica Paul is a 86 y.o. female with ***   11/05/2021 Weight loss of 36 pounds in 9 months. Coughing more, coughing up secretions. >> Doxy Aspiration >> cough  She does have shortness of breath US thyroid   Test Results:     Latest Ref Rng & Units 09/24/2021   12:10 PM 08/19/2021   11:49 AM 08/08/2021    4:17 AM  CBC  WBC 4.0 - 10.5 K/uL 10.4  8.7  9.8   Hemoglobin 12.0 - 15.0 g/dL 12.9  14.2  12.1   Hematocrit 36.0 - 46.0 % 40.8  43.4  39.2   Platelets 150 - 400 K/uL 305  297  257        Latest Ref Rng & Units 09/24/2021   12:10 PM 08/19/2021   11:49 AM 08/08/2021    4:17 AM  BMP  Glucose 70 - 99 mg/dL 110  117  106   BUN 8 - 23 mg/dL _0 Creatinine 0.44 - 1.00 mg/dL 0.89  1.12  0.85   BUN/Creat Ratio 12 - 28  19    Sodium 135 - 145 mmol/L 140  143  141   Potassium 3.5 - 5.1 mmol/L 3.7  4.4  3.6   Chloride 98 - 111 mmol/L 105  103  107   CO2 22 - 32 mmol/L _1 Calcium 8.9 - 10.3 mg/dL 8.5  9.0  8.6     BNP    Component Value Date/Time   BNP 677.0 (H) 08/07/2021 1440    ProBNP No results found for: "PROBNP"  PFT No results found for: "FEV1PRE", "FEV1POST", "FVCPRE", "FVCPOST", "TLC", "DLCOUNC", "PREFEV1FVCRT", "PSTFEV1FVCRT"  NM PET Image Initial (PI) Skull Base To Thigh (F-18 FDG)  Result Date: 11/01/2021 CLINICAL DATA:  Initial treatment strategy for history of right-sided lobectomy 25 years ago. Abnormal chest CT with prominent nodes, pleural thickening, and reticulonodular pulmonary opacities EXAM: NUCLEAR MEDICINE PET SKULL BASE TO THIGH TECHNIQUE: 7.8 mCi F-18 FDG was injected intravenously. Full-ring PET imaging was performed from the skull base to thigh after the radiotracer. CT data was obtained and used for attenuation correction and anatomic localization. Fasting blood glucose: 130 mg/dl COMPARISON:  Chest CT 09/24/2021.  Abdominopelvic CT 01/07/2019 FINDINGS: Mild limitations secondary to patient body  habitus. Mediastinal blood pool activity: SUV max 3.0 Liver activity: SUV max NA NECK: Left-sided thyroid hypermetabolism at a S.U.V. max of 6.6, without convincing evidence of underlying focal lesion. No cervical nodal hypermetabolism. Incidental CT findings: Bilateral carotid atherosclerosis. CHEST: Hypermetabolic thoracic nodes. Example precarinal node of 11 mm and a S.U.V. max of 4.3 on 41/4. Right hilar node measures a S.U.V. max of 4.6. Prevascular node measures 8 mm and a S.U.V. max of 3.1 on 47/4. Status post right lower lobectomy. Within the dependent right lung area of consolidation is moderate, heterogeneous hypermetabolism, most significant at a S.U.V. max of 7.7 on 50/4. Incidental CT findings: Deferred to recent diagnostic CT. Cardiomegaly. Aortic and coronary artery calcification. Pulmonary artery enlargement again identified. Relatively similar reticulonodular opacities throughout the remaining aerated right upper lobe. ABDOMEN/PELVIS: Pericholecystic right liver lobe hypoattenuating mass measures 4.8 cm and corresponds to mild hypermetabolism at a S.U.V. max of 6.1 on 100/4. Somewhat geographic hypoattenuation within the anterior right hepatic dome measures on the order of 9.5 x 5.8 cm and corresponds to hypermetabolism at a S.U.V. max of 5.6 on 76/4. No abdominopelvic nodal hypermetabolism. Incidental  CT findings: Left adrenal thickening. Normal right adrenal gland. Mild renal cortical thinning bilaterally. Dependent gallstones up to 7 mm. Extensive colonic diverticulosis. Fibroid uterus. SKELETON: Multifocal osseous metastasis. Example right sacral lytic lesion of 1.8 cm and a S.U.V. max of 9.6 108/4. A T5 lesion is relatively CT occult at a S.U.V. max of 10.0. Incidental CT findings: Osteopenia. IMPRESSION: 1. Widespread osseous metastasis. 2. 2 hypermetabolic hepatic lesions. The more caudal has morphology typical of metastasis. The more cephalad has a somewhat geographic CT appearance, but is  most likely related to an atypical appearance of metastatic disease. If the patient received radiation to the right lower chest, radiation induced hepatitis is possible but felt less likely. 3. Status post right lower lobectomy with hypermetabolism within an area of dependent right lung consolidation. Concurrent mildly hypermetabolic thoracic nodes. Findings are most likely related to metachronous primary bronchogenic carcinoma with nodal metastasis. Chronic infection or aspiration with reactive hypermetabolic nodes could look similar. 4. Consider sampling of either an osseous lesion or the more inferior right hepatic lobe mass. 5. Hypermetabolic left thyroid lobe without well-defined dominant mass. Of questionable clinical significance, given comorbidities. Technically, warrants further evaluation with ultrasound. (ref: J Am Coll Radiol. 2015 Feb;12(2): 143-50). 6. Mild limitations secondary to patient body habitus 7. Incidental findings, including: Coronary artery atherosclerosis. Aortic Atherosclerosis (ICD10-I70.0). Cholelithiasis. Fibroid uterus. Electronically Signed   By: Kyle  Talbot M.D.   On: 11/01/2021 14:41     Past medical hx Past Medical History:  Diagnosis Date   Allergy    "my nose runs alot"   Anxiety    Arthritis    Atrial fibrillation (HCC)    Blood transfusion without reported diagnosis    "when I had a miscarrage in 1957"   Cataract    bilateral removed   Cholelithiasis    Constipation    x ray showed increased stool in colon- uses Miralax daily and Dulcolax twice a week    Depression    Diabetes mellitus    Dyslipidemia    HTN (hypertension)    Hx of long-term (current) use of anticoagulants    Hyperlipidemia    Hypertension    Hypothyroidism    Lung cancer (HCC)    Obesity    Osteopenia    Vitamin D deficiency      Social History   Tobacco Use   Smoking status: Never   Smokeless tobacco: Never   Tobacco comments:    Never smoke 05/15/21  Vaping Use    Vaping Use: Never used  Substance Use Topics   Alcohol use: No   Drug use: No    Veronica Paul reports that she has never smoked. She has never used smokeless tobacco. She reports that she does not drink alcohol and does not use drugs.  Tobacco Cessation: Counseling given: Not Answered Tobacco comments: Never smoke 05/15/21   Past surgical hx, Family hx, Social hx all reviewed.  Current Outpatient Medications on File Prior to Visit  Medication Sig   acetaminophen (TYLENOL) 500 MG tablet Take 500 mg by mouth every 6 (six) hours as needed for moderate pain, headache or fever.   bisacodyl (DULCOLAX) 5 MG EC tablet Take 5 mg by mouth daily as needed for moderate constipation (usually twice per week).   Blood Glucose Monitoring Suppl (ONETOUCH VERIO) w/Device KIT Check blood sugars daily Dx E11.9   [facility] MELATONIN 10 MG CAPS TAKE 1 CAPSULE BY MOUTH EVERY DAY AT BEDTIME AS NEEDED (Patient taking differently: Take 1 capsule by mouth   at bedtime.)   diltiazem (CARDIZEM) 90 MG tablet TAKE 1 TABLET (90 MG TOTAL) BY MOUTH 3 (THREE) TIMES DAILY.   ELIQUIS 5 MG TABS tablet TAKE 1 TABLET BY MOUTH TWICE A DAY   feeding supplement (ENSURE ENLIVE / ENSURE PLUS) LIQD Take 237 mLs by mouth 3 (three) times daily between meals.   furosemide (LASIX) 20 MG tablet TAKE 1 TABLET BY MOUTH EVERY DAY   glucose blood (ONETOUCH VERIO) test strip CHECK BLOOD SUGARS DAILY E11.9   Lancet Devices (ONETOUCH DELICA PLUS LANCING) MISC 1 Device by Does not apply route daily.   Lancets (ONETOUCH DELICA PLUS FOYDXA12I) MISC USE TO CHECK BLOOD SUGARS DAILY DX E11.9   levothyroxine (SYNTHROID) 88 MCG tablet Take 1 tablet (88 mcg total) by mouth daily.   metoprolol tartrate (LOPRESSOR) 100 MG tablet TAKE 1.5 TABLETS BY MOUTH 2 TIMES DAILY. (Patient taking differently: Take 100 mg by mouth See admin instructions. Take 1.5 tablet by mouth twice a day)   Multiple Vitamin (MULTIVITAMIN) tablet Take 1 tablet by mouth daily.    nitroGLYCERIN (NITROSTAT) 0.4 MG SL tablet Place 1 tablet (0.4 mg total) under the tongue every 5 (five) minutes as needed for chest pain.   omeprazole (PRILOSEC) 20 MG capsule TAKE 1 CAPSULE BY MOUTH EVERY DAY   polyethylene glycol (MIRALAX / GLYCOLAX) 17 g packet Take 17 g by mouth daily as needed for mild constipation.   promethazine-dextromethorphan (PROMETHAZINE-DM) 6.25-15 MG/5ML syrup Take 5 mLs by mouth 4 (four) times daily as needed for cough. (Patient taking differently: Take 5 mLs by mouth every evening.)   sertraline (ZOLOFT) 50 MG tablet TAKE 1 TABLET BY MOUTH EVERY DAY   doxycycline (VIBRA-TABS) 100 MG tablet Take 1 tablet (100 mg total) by mouth 2 (two) times daily. 1 po bid (Patient not taking: Reported on 11/05/2021)   nystatin (MYCOSTATIN/NYSTOP) powder Apply 1 application. topically 3 (three) times daily. (Patient not taking: Reported on 11/05/2021)   ondansetron (ZOFRAN-ODT) 4 MG disintegrating tablet Take 1 tablet (4 mg total) by mouth every 8 (eight) hours as needed for nausea or vomiting. (Patient not taking: Reported on 11/05/2021)   oxyCODONE-acetaminophen (PERCOCET/ROXICET) 5-325 MG tablet Take 1 tablet by mouth every 6 (six) hours as needed for severe pain. (Patient not taking: Reported on 11/05/2021)   No current facility-administered medications on file prior to visit.     Allergies  Allergen Reactions   Ace Inhibitors Cough   Feldene [Piroxicam]     REACTION: RASH TO HANDS   Lexapro [Escitalopram]    Meloxicam Nausea And Vomiting   Prevnar [Pneumococcal 13-Val Conj Vacc]    Statins Other (See Comments)    myalgia   Sulfa Antibiotics     Rash    Zithromax [Azithromycin Dihydrate] Itching    Review Of Systems:  Constitutional:   No  weight loss, night sweats,  Fevers, chills, fatigue, or  lassitude.  HEENT:   No headaches,  Difficulty swallowing,  Tooth/dental problems, or  Sore throat,                No sneezing, itching, ear ache, nasal congestion, post  nasal drip,   CV:  No chest pain,  Orthopnea, PND, swelling in lower extremities, anasarca, dizziness, palpitations, syncope.   GI  No heartburn, indigestion, abdominal pain, nausea, vomiting, diarrhea, change in bowel habits, loss of appetite, bloody stools.   Resp: No shortness of breath with exertion or at rest.  No excess mucus, no productive cough,  No non-productive cough,  No coughing up of blood.  No change in color of mucus.  No wheezing.  No chest wall deformity  Skin: no rash or lesions.  GU: no dysuria, change in color of urine, no urgency or frequency.  No flank pain, no hematuria   MS:  No joint pain or swelling.  No decreased range of motion.  No back pain.  Psych:  No change in mood or affect. No depression or anxiety.  No memory loss.   Vital Signs BP 108/62 (BP Location: Right Arm, Patient Position: Sitting, Cuff Size: Normal)   Pulse 64   Temp 97.8 F (36.6 C) (Oral)   Ht 5' 3" (1.6 m)   Wt 157 lb 9.6 oz (71.5 kg)   LMP 05/11/1992   SpO2 97%   BMI 27.92 kg/m    Physical Exam:  General- No distress,  A&Ox3 ENT: No sinus tenderness, TM clear, pale nasal mucosa, no oral exudate,no post nasal drip, no LAN Cardiac: S1, S2, regular rate and rhythm, no murmur Chest: No wheeze/ rales/ dullness; no accessory muscle use, no nasal flaring, no sternal retractions Abd.: Soft Non-tender Ext: No clubbing cyanosis, edema Neuro:  normal strength Skin: No rashes, warm and dry Psych: normal mood and behavior   Assessment/Plan  No problem-specific Assessment & Plan notes found for this encounter.     F , NP 11/05/2021  11:15 AM          

## 2021-11-06 ENCOUNTER — Telehealth: Payer: Self-pay | Admitting: Nurse Practitioner

## 2021-11-06 ENCOUNTER — Encounter: Payer: Self-pay | Admitting: Acute Care

## 2021-11-06 ENCOUNTER — Telehealth: Payer: Self-pay | Admitting: Internal Medicine

## 2021-11-06 NOTE — Telephone Encounter (Signed)
lmtcb

## 2021-11-06 NOTE — Telephone Encounter (Signed)
  Prescription Request  11/06/2021  Is this a "Controlled Substance" medicine? no  Have you seen your PCP in the last 2 weeks? no  If YES, route message to pool  -  If NO, patient needs to be scheduled for appointment.  What is the name of the medication or equipment? New blood sugar monitor, lancets, and strips and cough syrup  Have you contacted your pharmacy to request a refill? no   Which pharmacy would you like this sent to? [facility] [place]   Patient notified that their request is being sent to the clinical staff for review and that they should receive a response within 2 business days.

## 2021-11-06 NOTE — Telephone Encounter (Signed)
Scheduled appt per 9/19 referral. Pt is aware of appt date and time. Pt is aware to arrive 15 mins prior to appt time and to bring and updated insurance card. Pt is aware of appt location.   

## 2021-11-06 NOTE — Telephone Encounter (Signed)
States it was just a Pharmacist, hospital for MMM. Aware you are out of the office today.

## 2021-11-07 MED ORDER — ONETOUCH DELICA PLUS LANCET33G MISC
3 refills | Status: AC
Start: 2021-11-07 — End: ?

## 2021-11-07 MED ORDER — ONETOUCH VERIO W/DEVICE KIT
PACK | 0 refills | Status: AC
Start: 2021-11-07 — End: ?

## 2021-11-07 MED ORDER — ONETOUCH VERIO VI STRP: ORAL_STRIP | 3 refills | Status: AC

## 2021-11-07 MED ORDER — BENZONATATE 100 MG PO CAPS: 100.0000 mg | ORAL_CAPSULE | Freq: Three times a day (TID) | ORAL | 0 refills | Status: DC | PRN

## 2021-11-07 NOTE — Telephone Encounter (Signed)
Meds ordered this encounter  Medications   Blood Glucose Monitoring Suppl (ONETOUCH VERIO) w/Device KIT    Sig: Check blood sugars daily Dx E11.9    Dispense:  1 kit    Refill:  0   glucose blood (ONETOUCH VERIO) test strip    Sig: CHECK BLOOD SUGARS DAILY E11.9    Dispense:  100 strip    Refill:  3   Lancets (ONETOUCH DELICA PLUS LANCET33G) MISC    Sig: USE TO CHECK BLOOD SUGARS DAILY DX E11.9    Dispense:  100 each    Refill:  3   benzonatate (TESSALON PERLES) 100 MG capsule    Sig: Take 1 capsule (100 mg total) by mouth 3 (three) times daily as needed.    Dispense:  20 capsule    Refill:  0    Order Specific Question:   Supervising Provider    Answer:   DETTINGER, JOSHUA A [[id number]]   Mary-Margaret , FNP  

## 2021-11-07 NOTE — Telephone Encounter (Signed)
Patients daughter notified and verbalized understanding 

## 2021-11-07 NOTE — Telephone Encounter (Signed)
Glucose monitor, test strips & lancets [facility] to [facility] Please advise on cough med Next OV 12/23/21

## 2021-11-08 ENCOUNTER — Telehealth: Payer: Self-pay | Admitting: Nurse Practitioner

## 2021-11-08 MED ORDER — PROMETHAZINE-DM 6.25-15 MG/5ML PO SYRP: 5.0000 mL | ORAL_SOLUTION | Freq: Four times a day (QID) | ORAL | 0 refills | Status: AC | PRN

## 2021-11-08 NOTE — Telephone Encounter (Signed)
Patient is unable to take the capsules that were called in for a cough. She would like to have the promethazine-dextromethorphan (PROMETHAZINE-DM) 6.25-15 MG/5ML syrup called in instead.

## 2021-11-11 ENCOUNTER — Encounter: Payer: Self-pay | Admitting: *Deleted

## 2021-11-11 ENCOUNTER — Ambulatory Visit
Admission: RE | Admit: 2021-11-11 | Discharge: 2021-11-11 | Disposition: A | Discharge status: Home / Self Care | Payer: Medicare HMO | Source: Ambulatory Visit | Attending: Acute Care | Admitting: Acute Care

## 2021-11-11 ENCOUNTER — Other Ambulatory Visit: Payer: Self-pay | Admitting: Acute Care

## 2021-11-11 DIAGNOSIS — R918 Other nonspecific abnormal finding of lung field: Secondary | ICD-10-CM

## 2021-11-11 DIAGNOSIS — K769 Liver disease, unspecified: Secondary | ICD-10-CM

## 2021-11-11 DIAGNOSIS — K7689 Other specified diseases of liver: Secondary | ICD-10-CM | POA: Diagnosis not present

## 2021-11-11 HISTORY — PX: IR RADIOLOGIST EVAL & MGMT: IMG5224

## 2021-11-11 NOTE — Consult Note (Signed)
Chief Complaint: Remote history of lung cancer, now with concern for metastatic disease  Referring Physician(s): Groce,Sarah F  History of Present Illness: Veronica Paul is a 86 y.o. female with complex past medical history significant for atrial fibrillation (on Eliquis), diabetes, hyperlipidemia, hypertension and hypothyroidism who was found to have enlarged precarinal and right hilar lymph nodes on chest CT performed 09/24/2021 with a subsequent PET/CT demonstrating two hypermetabolic liver lesions worrisome for metastatic disease.  Patient is medically fragile and therefore there has been a request for interventional radiology evaluation and management prior to proceeding with image guided biopsy for tissue diagnostic purposes.  The patient is accompanied on this telemedicine consultation by both of her daughters, Veronica Paul and Veronica Paul, both of whom serves as the primary historian.  Past Medical History:  Diagnosis Date   Allergy    "my nose runs alot"   Anxiety    Arthritis    Atrial fibrillation ([place])    Blood transfusion without reported diagnosis    "when I had a miscarrage in 1957"   Cataract    bilateral removed   Cholelithiasis    Constipation    x ray showed increased stool in colon- uses Miralax daily and Dulcolax twice a week    Depression    Diabetes mellitus    Dyslipidemia    HTN (hypertension)    Hx of long-term (current) use of anticoagulants    Hyperlipidemia    Hypertension    Hypothyroidism    Lung cancer ([place])    Obesity    Osteopenia    Vitamin D deficiency     Past Surgical History:  Procedure Laterality Date   CATARACT EXTRACTION Bilateral    COLONOSCOPY     COLONOSCOPY WITH PROPOFOL N/A 05/06/2019   Procedure: COLONOSCOPY WITH PROPOFOL;  Surgeon: Jerene Bears, MD;  Location: [facility];  Service: Gastroenterology;  Laterality: N/A;   ESOPHAGOGASTRODUODENOSCOPY (EGD) WITH PROPOFOL N/A 08/11/2021   Procedure: ESOPHAGOGASTRODUODENOSCOPY (EGD)  WITH PROPOFOL;  Surgeon: Harvel Quale, MD;  Location: [facility];  Service: Gastroenterology;  Laterality: N/A;   LUNG REMOVAL, PARTIAL  1998   Right   POLYPECTOMY  05/06/2019   Procedure: POLYPECTOMY;  Surgeon: Jerene Bears, MD;  Location: [facility];  Service: Gastroenterology;;   Azzie Almas DILATION  08/11/2021   Procedure: SAVORY DILATION;  Surgeon: Montez Morita, Quillian Quince, MD;  Location: [facility];  Service: Gastroenterology;;   THYROIDECTOMY     TUBAL LIGATION     UPPER GASTROINTESTINAL ENDOSCOPY      Allergies: Ace inhibitors, Feldene [piroxicam], Lexapro [escitalopram], Meloxicam, Prevnar [pneumococcal 13-val conj vacc], Statins, Sulfa antibiotics, and Zithromax [azithromycin dihydrate]  Medications: Prior to Admission medications   Medication Sig Start Date End Date Taking? Authorizing Provider  acetaminophen (TYLENOL) 500 MG tablet Take 500 mg by mouth every 6 (six) hours as needed for moderate pain, headache or fever.    [provider]  benzonatate (TESSALON PERLES) 100 MG capsule Take 1 capsule (100 mg total) by mouth 3 (three) times daily as needed. 11/07/21   Hassell Done, Mary-Margaret, FNP  bisacodyl (DULCOLAX) 5 MG EC tablet Take 5 mg by mouth daily as needed for moderate constipation (usually twice per week).    [provider]  Blood Glucose Monitoring Suppl (ONETOUCH VERIO) w/Device KIT Check blood sugars daily Dx E11.9 11/07/21   Hassell Done, Mary-Margaret, FNP  [facility] MELATONIN 10 MG CAPS TAKE 1 CAPSULE BY MOUTH EVERY DAY AT BEDTIME AS NEEDED Patient taking differently: Take 1  capsule by mouth at bedtime. 09/13/21   Hassell Done, Mary-Margaret, FNP  diltiazem (CARDIZEM) 90 MG tablet TAKE 1 TABLET (90 MG TOTAL) BY MOUTH 3 (THREE) TIMES DAILY. 09/13/21   Hassell Done, Mary-Margaret, FNP  doxycycline (VIBRA-TABS) 100 MG tablet Take 1 tablet (100 mg total) by mouth 2 (two) times daily for 7 days. 11/05/21 11/12/21  Magdalen Spatz, NP  ELIQUIS 5 MG TABS tablet TAKE 1  TABLET BY MOUTH TWICE A DAY 09/09/21   Hassell Done, Mary-Margaret, FNP  feeding supplement (ENSURE ENLIVE / ENSURE PLUS) LIQD Take 237 mLs by mouth 3 (three) times daily between meals. 02/27/21   Atway, Rayann N, DO  furosemide (LASIX) 20 MG tablet TAKE 1 TABLET BY MOUTH EVERY DAY 10/25/21   Hassell Done Mary-Margaret, FNP  glucose blood [facility] VERIO) test strip CHECK BLOOD SUGARS DAILY E11.9 11/07/21   Chevis Pretty, FNP  Lancet Devices [facility] DELICA PLUS LANCING) MISC 1 Device by Does not apply route daily. 03/01/20   Chevis Pretty, FNP  Lancets [facility] DELICA PLUS [id number]) MISC USE TO CHECK BLOOD SUGARS DAILY DX E11.9 11/07/21   Hassell Done Mary-Margaret, FNP  levothyroxine (SYNTHROID) 88 MCG tablet Take 1 tablet (88 mcg total) by mouth daily. 06/24/21   Hassell Done, Mary-Margaret, FNP  metoprolol tartrate (LOPRESSOR) 100 MG tablet TAKE 1.5 TABLETS BY MOUTH 2 TIMES DAILY. Patient taking differently: Take 100 mg by mouth See admin instructions. Take 1.5 tablet by mouth twice a day 09/13/21   Chevis Pretty, FNP  Multiple Vitamin (MULTIVITAMIN) tablet Take 1 tablet by mouth daily.    [provider]  nitroGLYCERIN (NITROSTAT) 0.4 MG SL tablet Place 1 tablet (0.4 mg total) under the tongue every 5 (five) minutes as needed for chest pain. 11/21/20   Hassell Done Mary-Margaret, FNP  nystatin (MYCOSTATIN/NYSTOP) powder Apply 1 application. topically 3 (three) times daily. Patient not taking: Reported on 11/05/2021 04/25/21   Chevis Pretty, FNP  omeprazole (PRILOSEC) 20 MG capsule TAKE 1 CAPSULE BY MOUTH EVERY DAY 10/25/21   Hassell Done, Mary-Margaret, FNP  ondansetron (ZOFRAN-ODT) 4 MG disintegrating tablet Take 1 tablet (4 mg total) by mouth every 8 (eight) hours as needed for nausea or vomiting. Patient not taking: Reported on 11/05/2021 07/18/21   Chevis Pretty, FNP  oxyCODONE-acetaminophen (PERCOCET/ROXICET) 5-325 MG tablet Take 1 tablet by mouth every 6 (six) hours as needed for severe  pain. Patient not taking: Reported on 11/05/2021 09/24/21   Hayden Rasmussen, MD  polyethylene glycol (MIRALAX / GLYCOLAX) 17 g packet Take 17 g by mouth daily as needed for mild constipation.    [provider]  promethazine-dextromethorphan (PROMETHAZINE-DM) 6.25-15 MG/5ML syrup Take 5 mLs by mouth 4 (four) times daily as needed for cough. 11/08/21   Chevis Pretty, FNP  sertraline (ZOLOFT) 50 MG tablet TAKE 1 TABLET BY MOUTH EVERY DAY 10/25/21   Chevis Pretty, FNP     Family History  Problem Relation Age of Onset   Coronary artery disease Brother 54   Hypertension Brother    Heart disease Brother    Hypertension Mother    Hypertension Father    Heart disease Father    Hypertension Sister    Heart disease Sister    Cancer Sister    Arthritis Daughter    COPD Daughter    Fibromyalgia Daughter    Ulcerative colitis Son    Stroke Maternal Grandmother    Cancer Brother    Lung cancer Brother    Hypertension Sister    Cancer Sister    Colon cancer Neg Hx  Food intolerance Neg Hx    Esophageal cancer Neg Hx    Rectal cancer Neg Hx    Stomach cancer Neg Hx    Colon polyps Neg Hx     Social History   Socioeconomic History   Marital status: Widowed    Spouse name: Not on file   Number of children: 4   Years of education: 62   Highest education level: 12th grade  Occupational History   Occupation: Retired    Comment: Conservation officer, historic buildings  Tobacco Use   Smoking status: Never   Smokeless tobacco: Never   Tobacco comments:    Never smoke 05/15/21  Vaping Use   Vaping Use: Never used  Substance and Sexual Activity   Alcohol use: No   Drug use: No   Sexual activity: Not Currently  Other Topics Concern   Not on file  Social History Narrative   Lives alone - one level   Social Determinants of Health   Financial Resource Strain: Low Risk  (09/18/2021)   Overall Financial Resource Strain (CARDIA)    Difficulty of Paying Living Expenses: Not hard at all   Food Insecurity: No Food Insecurity (09/18/2021)   Hunger Vital Sign    Worried About Running Out of Food in the Last Year: Never true    [place] in the Last Year: Never true  Transportation Needs: No Transportation Needs (09/18/2021)   PRAPARE - Hydrologist (Medical): No    Lack of Transportation (Non-Medical): No  Physical Activity: Insufficiently Active (09/18/2021)   Exercise Vital Sign    Days of Exercise per Week: 4 days    Minutes of Exercise per Session: 30 min  Stress: No Stress Concern Present (09/18/2021)   [place]    Feeling of Stress : Only a little  Social Connections: Socially Isolated (09/18/2021)   Social Connection and Isolation Panel [NHANES]    Frequency of Communication with Friends and Family: More than three times a week    Frequency of Social Gatherings with Friends and Family: More than three times a week    Attends Religious Services: Never    Marine scientist or Organizations: No    Attends Archivist Meetings: Never    Marital Status: Widowed    ECOG Status: 2 - Symptomatic, <50% confined to bed  Review of Systems  Review of Systems: A 12 point ROS discussed and pertinent positives are indicated in the HPI above.  All other systems are negative.  Physical Exam No direct physical exam was performed (except for noted visual exam findings with Video Visits).   Vital Signs: LMP 05/11/1992   Imaging:  Following examinations were personally reviewed in detail: PET/CT-10/31/2021; chest CT-09/24/2021  Personal review of PET/CT performed 10/31/2021 demonstrates two ill-defined areas of hypoattenuation within the right lobe of the liver, both of which demonstrate increased metabolic activity.  Dominant hypermetabolic lesion within the caudal inferior aspect of the right lobe of the liver measures approximately 5.2 x 4.1 cm (105, series 4 and  demonstrates an SUV max of 6.1.  Note is made of a apparent lytic lesion involving the right side of the sacral ala measuring approximately 1.8 x 1.6 cm (image 108, series 4 with SUV max of 9.6.    NM PET Image Initial (PI) Skull Base To Thigh (F-18 FDG)  Result Date: 11/01/2021 CLINICAL DATA:  Initial treatment strategy for history of right-sided lobectomy  25 years ago. Abnormal chest CT with prominent nodes, pleural thickening, and reticulonodular pulmonary opacities EXAM: NUCLEAR MEDICINE PET SKULL BASE TO THIGH TECHNIQUE: 7.8 mCi F-18 FDG was injected intravenously. Full-ring PET imaging was performed from the skull base to thigh after the radiotracer. CT data was obtained and used for attenuation correction and anatomic localization. Fasting blood glucose: 130 mg/dl COMPARISON:  Chest CT 09/24/2021.  Abdominopelvic CT 01/07/2019 FINDINGS: Mild limitations secondary to patient body habitus. Mediastinal blood pool activity: SUV max 3.0 Liver activity: SUV max NA NECK: Left-sided thyroid hypermetabolism at a S.U.V. max of 6.6, without convincing evidence of underlying focal lesion. No cervical nodal hypermetabolism. Incidental CT findings: Bilateral carotid atherosclerosis. CHEST: Hypermetabolic thoracic nodes. Example precarinal node of 11 mm and a S.U.V. max of 4.3 on 41/4. Right hilar node measures a S.U.V. max of 4.6. Prevascular node measures 8 mm and a S.U.V. max of 3.1 on 47/4. Status post right lower lobectomy. Within the dependent right lung area of consolidation is moderate, heterogeneous hypermetabolism, most significant at a S.U.V. max of 7.7 on 50/4. Incidental CT findings: Deferred to recent diagnostic CT. Cardiomegaly. Aortic and coronary artery calcification. Pulmonary artery enlargement again identified. Relatively similar reticulonodular opacities throughout the remaining aerated right upper lobe. ABDOMEN/PELVIS: Pericholecystic right liver lobe hypoattenuating mass measures 4.8 cm and  corresponds to mild hypermetabolism at a S.U.V. max of 6.1 on 100/4. Somewhat geographic hypoattenuation within the anterior right hepatic dome measures on the order of 9.5 x 5.8 cm and corresponds to hypermetabolism at a S.U.V. max of 5.6 on 76/4. No abdominopelvic nodal hypermetabolism. Incidental CT findings: Left adrenal thickening. Normal right adrenal gland. Mild renal cortical thinning bilaterally. Dependent gallstones up to 7 mm. Extensive colonic diverticulosis. Fibroid uterus. SKELETON: Multifocal osseous metastasis. Example right sacral lytic lesion of 1.8 cm and a S.U.V. max of 9.6 108/4. A T5 lesion is relatively CT occult at a S.U.V. max of 10.0. Incidental CT findings: Osteopenia. IMPRESSION: 1. Widespread osseous metastasis. 2. 2 hypermetabolic hepatic lesions. The more caudal has morphology typical of metastasis. The more cephalad has a somewhat geographic CT appearance, but is most likely related to an atypical appearance of metastatic disease. If the patient received radiation to the right lower chest, radiation induced hepatitis is possible but felt less likely. 3. Status post right lower lobectomy with hypermetabolism within an area of dependent right lung consolidation. Concurrent mildly hypermetabolic thoracic nodes. Findings are most likely related to metachronous primary bronchogenic carcinoma with nodal metastasis. Chronic infection or aspiration with reactive hypermetabolic nodes could look similar. 4. Consider sampling of either an osseous lesion or the more inferior right hepatic lobe mass. 5. Hypermetabolic left thyroid lobe without well-defined dominant mass. Of questionable clinical significance, given comorbidities. Technically, warrants further evaluation with ultrasound. (ref: J Am Coll Radiol. 2015 Feb;12(2): 143-50). 6. Mild limitations secondary to patient body habitus 7. Incidental findings, including: Coronary artery atherosclerosis. Aortic Atherosclerosis (ICD10-I70.0).  Cholelithiasis. Fibroid uterus. Electronically Signed   By: Abigail Miyamoto M.D.   On: 11/01/2021 14:41    Labs:  CBC: Recent Labs    08/07/21 1440 08/08/21 0417 08/19/21 1149 09/24/21 1210  WBC 13.7* 9.8 8.7 10.4  HGB 15.9* 12.1 14.2 12.9  HCT 51.3* 39.2 43.4 40.8  PLT 382 257 297 305    COAGS: Recent Labs    08/07/21 1612  INR 1.2  APTT 34    BMP: Recent Labs    05/01/21 1402 06/24/21 0956 08/07/21 1440 08/08/21 0417 08/19/21 1149 09/24/21 1210  NA 139   < > 141 141 143 140  K 3.8   < > 4.0 3.6 4.4 3.7  CL 99   < > 102 107 103 105  CO2 30   < > '27 26 25 30  ' GLUCOSE 108*   < > 165* 106* 117* 110*  BUN 12   < > '14 14 21 19  ' CALCIUM 8.6*   < > 9.5 8.6* 9.0 8.5*  CREATININE 1.05*   < > 0.98 0.85 1.12* 0.89  GFRNONAA 53*  --  57* >60  --  >60   < > = values in this interval not displayed.    LIVER FUNCTION TESTS: Recent Labs    07/18/21 1241 08/07/21 1440 08/19/21 1149 09/24/21 1210  BILITOT 0.7 0.6 0.3 0.9  AST 31 48* 33 38  ALT '26 31 23 23  ' ALKPHOS 110 103 99 98  PROT 6.8 9.1* 6.6 7.1  ALBUMIN 3.7 4.5 4.0 3.5    TUMOR MARKERS: No results for input(s): "AFPTM", "CEA", "CA199", "CHROMGRNA" in the last 8760 hours.  Assessment and Plan:  IRACEMA LANAGAN is a 86 y.o. female with complex past medical history significant for atrial fibrillation (on Eliquis), diabetes, hyperlipidemia, hypertension and hypothyroidism who was found to have enlarged precarinal and right hilar lymph nodes on chest CT performed 09/24/2021 with a subsequent PET/CT demonstrating two hypermetabolic liver lesions worrisome for metastatic disease.  The following examinations were personally reviewed in detail: PET/CT-10/31/2021; chest CT-09/24/2021  Personal review of PET/CT performed 10/31/2021 demonstrates two ill-defined areas of hypoattenuation within the right lobe of the liver, both of which demonstrate increased metabolic activity.  Dominant hypermetabolic lesion within the caudal  inferior aspect of the right lobe of the liver measures approximately 5.2 x 4.1 cm (105, series 4 and demonstrates an SUV max of 6.1.  Note is made of a apparent lytic lesion involving the right side of the sacral ala measuring approximately 1.8 x 1.6 cm (image 108, series 4 with SUV max of 9.6.  While the lesions within the right lobe of the liver could represent geographic areas of focal fatty infiltration, there is asymmetrically increased metabolic activity most conspicuous involving the more caudal lesion and as such would plan on proceeding with ultrasound-guided liver lesion biopsy for tissue diagnostic purposes.  Note, at the time of real-time sonographic evaluation the lesion proves hyperechoic, would then consider CT-guided biopsy of the right sacral lesion.    As such, risks and benefits of ultrasound-guided liver lesion biopsy was discussed with the patient and/or patient's family including, but not limited to bleeding, infection, damage to adjacent structures or low yield requiring additional tests.  I explained that the procedure is performed on an outpatient basis either at [facility] hospital or [facility].  The procedure would require the patient to hold her Eliquis (anticoagulation), for at least 48 hours prior to the procedure.    The procedure is performed with conscious sedation and is considered minimally invasive with expected minimal amount of postprocedural discomfort associated with the needle biopsy.    Biopsy results are typically available within 48 hours.  I explained that meaningful treatment discussions cannot be held until a biopsy is performed to establish a tissue diagnosis.   All of the questions were answered and there is agreement to proceed with image guided biopsy.  PLAN: - Will message Eric Form to place an order for US guided liver lesion biopsy.     A copy of this report was  sent to the requesting provider on this date.  Electronically  Signed: Sandi Mariscal 11/11/2021, 11:42 AM   I spent a total of 15 Minutes in remote  clinical consultation, greater than 50% of which was counseling/coordinating care for findings worrisome for metastatic disease.    Visit type: Audio only (telephone). Audio (no video) only due to patient's lack of internet/smartphone capability. Alternative for in-person consultation at [facility], [address] [place], [place], [place]. This visit type was conducted due to national recommendations for restrictions regarding the COVID-19 Pandemic (e.g. social distancing).  This format is felt to be most appropriate for this patient at this time.  All issues noted in this document were discussed and addressed.

## 2021-11-12 ENCOUNTER — Ambulatory Visit (HOSPITAL_COMMUNITY)
Admission: RE | Admit: 2021-11-12 | Discharge: 2021-11-12 | Disposition: A | Discharge status: Home / Self Care | Payer: Medicare HMO | Source: Ambulatory Visit | Attending: Acute Care | Admitting: Acute Care

## 2021-11-12 ENCOUNTER — Other Ambulatory Visit: Payer: Self-pay

## 2021-11-12 DIAGNOSIS — C3491 Malignant neoplasm of unspecified part of right bronchus or lung: Secondary | ICD-10-CM | POA: Diagnosis not present

## 2021-11-12 DIAGNOSIS — E041 Nontoxic single thyroid nodule: Secondary | ICD-10-CM | POA: Diagnosis not present

## 2021-11-13 ENCOUNTER — Other Ambulatory Visit: Payer: Self-pay

## 2021-11-13 ENCOUNTER — Inpatient Hospital Stay: Payer: Medicare HMO | Attending: Internal Medicine | Admitting: Internal Medicine

## 2021-11-13 ENCOUNTER — Inpatient Hospital Stay: Payer: Medicare HMO

## 2021-11-13 ENCOUNTER — Encounter: Payer: Self-pay | Admitting: Internal Medicine

## 2021-11-13 VITALS — BP 121/63 | HR 82 | Temp 98.7°F | Resp 15 | Wt 157.3 lb

## 2021-11-13 DIAGNOSIS — C7951 Secondary malignant neoplasm of bone: Secondary | ICD-10-CM

## 2021-11-13 DIAGNOSIS — I4891 Unspecified atrial fibrillation: Secondary | ICD-10-CM | POA: Diagnosis not present

## 2021-11-13 DIAGNOSIS — R69 Illness, unspecified: Secondary | ICD-10-CM | POA: Diagnosis not present

## 2021-11-13 DIAGNOSIS — Z85118 Personal history of other malignant neoplasm of bronchus and lung: Secondary | ICD-10-CM | POA: Insufficient documentation

## 2021-11-13 DIAGNOSIS — K769 Liver disease, unspecified: Secondary | ICD-10-CM | POA: Diagnosis not present

## 2021-11-13 DIAGNOSIS — Z809 Family history of malignant neoplasm, unspecified: Secondary | ICD-10-CM | POA: Diagnosis not present

## 2021-11-13 DIAGNOSIS — Z902 Acquired absence of lung [part of]: Secondary | ICD-10-CM | POA: Diagnosis not present

## 2021-11-13 DIAGNOSIS — C342 Malignant neoplasm of middle lobe, bronchus or lung: Secondary | ICD-10-CM

## 2021-11-13 DIAGNOSIS — R918 Other nonspecific abnormal finding of lung field: Secondary | ICD-10-CM | POA: Insufficient documentation

## 2021-11-13 DIAGNOSIS — Z7722 Contact with and (suspected) exposure to environmental tobacco smoke (acute) (chronic): Secondary | ICD-10-CM

## 2021-11-13 DIAGNOSIS — E119 Type 2 diabetes mellitus without complications: Secondary | ICD-10-CM

## 2021-11-13 DIAGNOSIS — Z79899 Other long term (current) drug therapy: Secondary | ICD-10-CM | POA: Diagnosis not present

## 2021-11-13 DIAGNOSIS — E559 Vitamin D deficiency, unspecified: Secondary | ICD-10-CM | POA: Diagnosis not present

## 2021-11-13 DIAGNOSIS — J90 Pleural effusion, not elsewhere classified: Secondary | ICD-10-CM | POA: Insufficient documentation

## 2021-11-13 DIAGNOSIS — M8580 Other specified disorders of bone density and structure, unspecified site: Secondary | ICD-10-CM | POA: Insufficient documentation

## 2021-11-13 DIAGNOSIS — C3491 Malignant neoplasm of unspecified part of right bronchus or lung: Secondary | ICD-10-CM

## 2021-11-13 DIAGNOSIS — F419 Anxiety disorder, unspecified: Secondary | ICD-10-CM | POA: Insufficient documentation

## 2021-11-13 DIAGNOSIS — I10 Essential (primary) hypertension: Secondary | ICD-10-CM | POA: Diagnosis not present

## 2021-11-13 LAB — CMP (CANCER CENTER ONLY)
ALT: 40 U/L (ref 0–44)
AST: 92 U/L — ABNORMAL HIGH (ref 15–41)
Albumin: 3.6 g/dL (ref 3.5–5.0)
Alkaline Phosphatase: 177 U/L — ABNORMAL HIGH (ref 38–126)
Anion gap: 3 — ABNORMAL LOW (ref 5–15)
BUN: 19 mg/dL (ref 8–23)
CO2: 34 mmol/L — ABNORMAL HIGH (ref 22–32)
Calcium: 8.8 mg/dL — ABNORMAL LOW (ref 8.9–10.3)
Chloride: 104 mmol/L (ref 98–111)
Creatinine: 0.92 mg/dL (ref 0.44–1.00)
GFR, Estimated: >60 mL/min (ref >60)
Glucose, Bld: 95 mg/dL (ref 70–99)
Potassium: 4 mmol/L (ref 3.5–5.1)
Sodium: 141 mmol/L (ref 135–145)
Total Bilirubin: 0.5 mg/dL (ref 0.3–1.2)
Total Protein: 7 g/dL (ref 6.5–8.1)

## 2021-11-13 LAB — CBC WITH DIFFERENTIAL (CANCER CENTER ONLY)
Abs Immature Granulocytes: 0.03 10*3/uL (ref 0.00–0.07)
Basophils Absolute: 0.1 10*3/uL (ref 0.0–0.1)
Basophils Relative: 2 %
Eosinophils Absolute: 1.3 10*3/uL — ABNORMAL HIGH (ref 0.0–0.5)
Eosinophils Relative: 15 %
HCT: 43.2 % (ref 36.0–46.0)
Hemoglobin: 14 g/dL (ref 12.0–15.0)
Immature Granulocytes: 0 %
Lymphocytes Relative: 15 %
Lymphs Abs: 1.3 10*3/uL (ref 0.7–4.0)
MCH: 28.1 pg (ref 26.0–34.0)
MCHC: 32.4 g/dL (ref 30.0–36.0)
MCV: 86.7 fL (ref 80.0–100.0)
Monocytes Absolute: 0.9 10*3/uL (ref 0.1–1.0)
Monocytes Relative: 11 %
Neutro Abs: 5.1 10*3/uL (ref 1.7–7.7)
Neutrophils Relative %: 57 %
Platelet Count: 298 10*3/uL (ref 150–400)
RBC: 4.98 MIL/uL (ref 3.87–5.11)
RDW: 14.5 % (ref 11.5–15.5)
WBC Count: 8.8 10*3/uL (ref 4.0–10.5)
nRBC: 0 % (ref 0.0–0.2)

## 2021-11-13 NOTE — Progress Notes (Unsigned)
Minus Breeding, MD  Donita Brooks D Yes.  OK to hold for two days prior to biopsy and start when operating physician suggests they should restart.

## 2021-11-13 NOTE — Progress Notes (Unsigned)
Mir, Paula Libra, MD  Donita Brooks D; P Ir Procedure Requests Approved for US guided biopsy of right liver mass.  FDG avid on last PET from 10/31/2021.   Mir

## 2021-11-13 NOTE — Progress Notes (Signed)
[facility] [phone]   [phone]  CONSULT NOTE  REFERRING PHYSICIAN:  REASON FOR CONSULTATION:  86 years old white female with suspicious lung cancer.  HPI Veronica Paul is a 86 y.o. female never smoker with past medical history significant for multiple medical problems including history of hypertension, dyslipidemia, diabetes mellitus, atrial fibrillation, and anxiety, osteoarthritis, hypothyroidism, osteopenia and vitamin D deficiency as well as history of depression.  The patient also has a history of lung cancer status post right lower lobectomy in 1998 under the care of Dr. Jearld Fenton and she was followed by observation..  In June 2023 she has been complaining of cough and shortness of breath and she had chest x-ray on 08/07/2021 that showed right lung base infiltrate suspicious for pneumonia.  She was treated with a course of antibiotics.  On September 24, 2021 while the patient was going for an appointment in the wheelchair she accidentally have a fall and she was evaluated at the emergency department after the fall.  She had several imaging studies including CT scan of the head, cervical, thoracic and lumbar spines without contrast on 09/24/2021 and that showed no evidence of acute intracranial abnormality there was L5 superior endplate fracture with 62% height loss that is probably acute.  There was also mild height loss of the T1 and T2 vertebral bodies with mild sclerosis along the superior T1 endplate and mild buckling of the anterior cortex which is new compared to August 27, 2021 and compatible with interval superior endplate fractures.  The patient was also found to have a small right pleural effusion and reticulonodular opacities in the visualized right dependent lung with questionable soft tissue prominence in the hilar region.  The patient had CT scan of the chest with contrast on 09/24/2021 and that showed postoperative changes of the right lower lobectomy  and complete atelectatic collapse of the right middle lobe.  There was peribronchovascular patchy and reticular nodular airspace opacities noted in the inferior posterior right upper lobe and irregular nodular pleural thickening noted along the posterior aspect of the right hemothorax.  There was also enlarged precarinal and right hilar nodes with additional soft tissue density at the right hilum that could represent a conglomerate of additional hilar nodes.  There was also nodular pleural thickening in the right hemothorax that could represent malignancy with a mass at the right hilum.  The patient had a PET scan on 10/31/2021 and it showed hypermetabolic thoracic nodes including precarinal node measuring 1.1 cm with SUV max of 4.3.  There was right hilar node with SUV max of 4.6, prevascular node measuring 0.8 cm and SUV of 3.1.  Within the dependent right lung area of consolidation there was moderate heterogeneous hypermetabolism most significant at SUV max of 7.7.  The scan also showed pericholecystic right liver lobe hypoattenuating mass measuring 4.8 cm and corresponds to mild hypermetabolism with SUV max of 6.1.  There was also some geographic hypoattenuation within the anterior right hepatic dome measuring 9.5 x 5.8 cm and correspond to hypermetabolism with SUV max of 5.6.  The scan also showed multifocal osseous metastasis including right sacral lytic lesion measuring 1.8 cm and SUV max of 9.6.  There was a T5 lesion that relatively CTO cult with SUV max of 10.0. There was a right thyroid lobectomy on ultrasound of the thyroid with nonspecific activity in the remaining thyroid parenchyma. The patient is scheduled for ultrasound-guided core biopsy of one of the liver lesion on November 18, 2021. She was referred to me today for evaluation and recommendation regarding her condition.  When seen today the patient continues to complain of left shoulder pain as well as tightness in the stomach area.  She has no  chest pain but has cough with no shortness of breath or hemoptysis.  She has no nausea, vomiting, diarrhea or constipation.  She has no headache or visual changes. Family history mother died at age 49 from old age.  Father died from heart disease.  She had a brother who died from heart disease at age 74 and another brother and 2 sisters with different type of cancers. The patient is a widow and has 4 children.  She was accompanied today by her 2 daughters Neoma Laming and Jenny Reichmann.  She used to work in [employer].  She has no history for smoking but she had secondhand smoking exposure.  She has no history of alcohol or drug abuse.   HPI  Past Medical History:  Diagnosis Date   Allergy    "my nose runs alot"   Anxiety    Arthritis    Atrial fibrillation ([place])    Blood transfusion without reported diagnosis    "when I had a miscarrage in 1957"   Cataract    bilateral removed   Cholelithiasis    Constipation    x ray showed increased stool in colon- uses Miralax daily and Dulcolax twice a week    Depression    Diabetes mellitus    Dyslipidemia    HTN (hypertension)    Hx of long-term (current) use of anticoagulants    Hyperlipidemia    Hypertension    Hypothyroidism    Lung cancer ([place])    Obesity    Osteopenia    Vitamin D deficiency     Past Surgical History:  Procedure Laterality Date   CATARACT EXTRACTION Bilateral    COLONOSCOPY     COLONOSCOPY WITH PROPOFOL N/A 05/06/2019   Procedure: COLONOSCOPY WITH PROPOFOL;  Surgeon: Jerene Bears, MD;  Location: [facility];  Service: Gastroenterology;  Laterality: N/A;   ESOPHAGOGASTRODUODENOSCOPY (EGD) WITH PROPOFOL N/A 08/11/2021   Procedure: ESOPHAGOGASTRODUODENOSCOPY (EGD) WITH PROPOFOL;  Surgeon: Harvel Quale, MD;  Location: [facility];  Service: Gastroenterology;  Laterality: N/A;   IR RADIOLOGIST EVAL & MGMT  11/11/2021   LUNG REMOVAL, PARTIAL  1998   Right   POLYPECTOMY  05/06/2019   Procedure:  POLYPECTOMY;  Surgeon: Jerene Bears, MD;  Location: [facility];  Service: Gastroenterology;;   Azzie Almas DILATION  08/11/2021   Procedure: Azzie Almas DILATION;  Surgeon: Montez Morita, Quillian Quince, MD;  Location: [facility];  Service: Gastroenterology;;   THYROIDECTOMY     TUBAL LIGATION     UPPER GASTROINTESTINAL ENDOSCOPY      Family History  Problem Relation Age of Onset   Coronary artery disease Brother 43   Hypertension Brother    Heart disease Brother    Hypertension Mother    Hypertension Father    Heart disease Father    Hypertension Sister    Heart disease Sister    Cancer Sister    Arthritis Daughter    COPD Daughter    Fibromyalgia Daughter    Ulcerative colitis Son    Stroke Maternal Grandmother    Cancer Brother    Lung cancer Brother    Hypertension Sister    Cancer Sister    Colon cancer Neg Hx    Food intolerance Neg Hx  Esophageal cancer Neg Hx    Rectal cancer Neg Hx    Stomach cancer Neg Hx    Colon polyps Neg Hx     Social History Social History   Tobacco Use   Smoking status: Never   Smokeless tobacco: Never   Tobacco comments:    Never smoke 05/15/21  Vaping Use   Vaping Use: Never used  Substance Use Topics   Alcohol use: No   Drug use: No    Allergies  Allergen Reactions   Ace Inhibitors Cough   Feldene [Piroxicam]     REACTION: RASH TO HANDS   Lexapro [Escitalopram]    Meloxicam Nausea And Vomiting   Prevnar [Pneumococcal 13-Val Conj Vacc]    Statins Other (See Comments)    myalgia   Sulfa Antibiotics     Rash    Zithromax [Azithromycin Dihydrate] Itching    Current Outpatient Medications  Medication Sig Dispense Refill   acetaminophen (TYLENOL) 500 MG tablet Take 500 mg by mouth every 6 (six) hours as needed for moderate pain, headache or fever.     benzonatate (TESSALON PERLES) 100 MG capsule Take 1 capsule (100 mg total) by mouth 3 (three) times daily as needed. 20 capsule 0   bisacodyl (DULCOLAX) 5 MG EC tablet Take 5  mg by mouth daily as needed for moderate constipation (usually twice per week).     Blood Glucose Monitoring Suppl (ONETOUCH VERIO) w/Device KIT Check blood sugars daily Dx E11.9 1 kit 0   [employer] MELATONIN 10 MG CAPS TAKE 1 CAPSULE BY MOUTH EVERY DAY AT BEDTIME AS NEEDED (Patient taking differently: Take 1 capsule by mouth at bedtime.) 90 capsule 1   diltiazem (CARDIZEM) 90 MG tablet TAKE 1 TABLET (90 MG TOTAL) BY MOUTH 3 (THREE) TIMES DAILY. 270 tablet 1   ELIQUIS 5 MG TABS tablet TAKE 1 TABLET BY MOUTH TWICE A DAY 180 tablet 1   feeding supplement (ENSURE ENLIVE / ENSURE PLUS) LIQD Take 237 mLs by mouth 3 (three) times daily between meals. 237 mL 12   furosemide (LASIX) 20 MG tablet TAKE 1 TABLET BY MOUTH EVERY DAY 90 tablet 0   glucose blood (ONETOUCH VERIO) test strip CHECK BLOOD SUGARS DAILY E11.9 100 strip 3   Lancet Devices (ONETOUCH DELICA PLUS LANCING) MISC 1 Device by Does not apply route daily. 1 each 0   Lancets (ONETOUCH DELICA PLUS [id number]) MISC USE TO CHECK BLOOD SUGARS DAILY DX E11.9 100 each 3   levothyroxine (SYNTHROID) 88 MCG tablet Take 1 tablet (88 mcg total) by mouth daily. 90 tablet 1   metoprolol tartrate (LOPRESSOR) 100 MG tablet TAKE 1.5 TABLETS BY MOUTH 2 TIMES DAILY. (Patient taking differently: Take 100 mg by mouth See admin instructions. Take 1.5 tablet by mouth twice a day) 270 tablet 1   Multiple Vitamin (MULTIVITAMIN) tablet Take 1 tablet by mouth daily.     nitroGLYCERIN (NITROSTAT) 0.4 MG SL tablet Place 1 tablet (0.4 mg total) under the tongue every 5 (five) minutes as needed for chest pain. 25 tablet 1   nystatin (MYCOSTATIN/NYSTOP) powder Apply 1 application. topically 3 (three) times daily. (Patient not taking: Reported on 11/05/2021) 15 g 1   omeprazole (PRILOSEC) 20 MG capsule TAKE 1 CAPSULE BY MOUTH EVERY DAY 90 capsule 0   ondansetron (ZOFRAN-ODT) 4 MG disintegrating tablet Take 1 tablet (4 mg total) by mouth every 8 (eight) hours as needed for nausea or  vomiting. (Patient not taking: Reported on 11/05/2021) 20 tablet 1  oxyCODONE-acetaminophen (PERCOCET/ROXICET) 5-325 MG tablet Take 1 tablet by mouth every 6 (six) hours as needed for severe pain. (Patient not taking: Reported on 11/05/2021) 15 tablet 0   polyethylene glycol (MIRALAX / GLYCOLAX) 17 g packet Take 17 g by mouth daily as needed for mild constipation.     promethazine-dextromethorphan (PROMETHAZINE-DM) 6.25-15 MG/5ML syrup Take 5 mLs by mouth 4 (four) times daily as needed for cough. 118 mL 0   sertraline (ZOLOFT) 50 MG tablet TAKE 1 TABLET BY MOUTH EVERY DAY 90 tablet 0   No current facility-administered medications for this visit.    Review of Systems  Constitutional: positive for fatigue Eyes: negative Ears, nose, mouth, throat, and face: negative Respiratory: positive for cough Cardiovascular: negative Gastrointestinal: negative Genitourinary:negative Integument/breast: negative Hematologic/lymphatic: negative Musculoskeletal:positive for arthralgias and muscle weakness Neurological: negative Behavioral/Psych: negative Endocrine: negative Allergic/Immunologic: negative  Physical Exam  EKC:MKLKJ, healthy, no distress, well nourished, and well developed SKIN: skin color, texture, turgor are normal, no rashes or significant lesions HEAD: Normocephalic, No masses, lesions, tenderness or abnormalities EYES: normal, PERRLA, Conjunctiva are pink and non-injected EARS: External ears normal, Canals clear OROPHARYNX:no exudate, no erythema, and lips, buccal mucosa, and tongue normal  NECK: supple, no adenopathy, no JVD LYMPH:  no palpable lymphadenopathy, no hepatosplenomegaly BREAST:not examined LUNGS: clear to auscultation , and palpation HEART: regular rate & rhythm, no murmurs, and no gallops ABDOMEN:abdomen soft, non-tender, normal bowel sounds, and no masses or organomegaly BACK: Back symmetric, no curvature., No CVA tenderness EXTREMITIES:no joint deformities,  effusion, or inflammation, no edema  NEURO: alert & oriented x 3 with fluent speech, no focal motor/sensory deficits  PERFORMANCE STATUS: ECOG 1  LABORATORY DATA: Lab Results  Component Value Date   WBC 8.8 11/13/2021   HGB 14.0 11/13/2021   HCT 43.2 11/13/2021   MCV 86.7 11/13/2021   PLT 298 11/13/2021      Chemistry      Component Value Date/Time   NA 141 11/13/2021 1105   NA 143 08/19/2021 1149   K 4.0 11/13/2021 1105   CL 104 11/13/2021 1105   CO2 34 (H) 11/13/2021 1105   BUN 19 11/13/2021 1105   BUN 21 08/19/2021 1149   CREATININE 0.92 11/13/2021 1105   CREATININE 0.85 12/29/2018 1542   GLU 118 02/10/2012 0000      Component Value Date/Time   CALCIUM 8.8 (L) 11/13/2021 1105   ALKPHOS 177 (H) 11/13/2021 1105   AST 92 (H) 11/13/2021 1105   ALT 40 11/13/2021 1105   BILITOT 0.5 11/13/2021 1105       RADIOGRAPHIC STUDIES: US THYROID  Result Date: 11/12/2021 CLINICAL DATA:  Incidental on PET. History of right thyroid lobectomy, now with diffuse increased metabolic activity within the remaining thyroid parenchyma on PET-CT performed 10/31/2021. EXAM: THYROID ULTRASOUND TECHNIQUE: Ultrasound examination of the thyroid gland and adjacent soft tissues was performed. COMPARISON:  PET-CT-10/31/2021 FINDINGS: Parenchymal Echotexture: Mildly heterogenous Isthmus: Normal in size measuring 0.3 cm in diameter Right lobe: Surgically absent. There is no residual nodular tissue within the right lobectomy resection bed. Left lobe: Atrophic measuring 3.7 x 1.6 x 1.4 cm _________________________________________________________ Estimated total number of nodules >/= 1 cm: 0 Number of spongiform nodules >/=  2 cm not described below (TR1): 0 Number of mixed cystic and solid nodules >/= 1.5 cm not described below (TR2): 0 _________________________________________________________ Personal review of preceding PET-CT demonstrates diffuse increased metabolic activity within the remaining left lobe of  the thyroid without focality (representative image 32, series 604). _________________________________________________________  There is an approximately 0.8 x 0.7 x 0.7 cm isoechoic ill-defined nodule/pseudonodule within the mid, medial aspect of the left lobe of the thyroid (labeled 1), which does not meet imaging criteria to recommend percutaneous sampling or continued dedicated follow-up. There is an approximately 1.3 x 1.3 x 0.8 cm isoechoic ill-defined nodule/pseudonodule within the left side of the thyroid isthmus (labeled 2), which does not meet criteria to recommend percutaneous sampling or continued dedicated follow-up. IMPRESSION: 1. Post right thyroid lobectomy without evidence of locally recurrent or residual disease. 2. Personal review of preceding PET-CT demonstrates diffuse increased metabolic activity within the remaining thyroid parenchyma without focality, nonspecific though could be seen in the setting of a thyroiditis. 3. Punctate (sub 1.3 cm) isoechoic ill-defined nodules/pseudo nodules within the left lobe of the thyroid isthmus do not meet imaging criteria to recommend percutaneous sampling or continued dedicated follow-up. The above is in keeping with the ACR TI-RADS recommendations - J Am Coll Radiol 2017;14:587-595. Electronically Signed   By: Sandi Mariscal M.D.   On: 11/12/2021 11:54   IR Radiologist Eval & Mgmt  Result Date: 11/11/2021 Please refer to notes tab for details about interventional procedure. (Op Note)  NM PET Image Initial (PI) Skull Base To Thigh (F-18 FDG)  Result Date: 11/01/2021 CLINICAL DATA:  Initial treatment strategy for history of right-sided lobectomy 25 years ago. Abnormal chest CT with prominent nodes, pleural thickening, and reticulonodular pulmonary opacities EXAM: NUCLEAR MEDICINE PET SKULL BASE TO THIGH TECHNIQUE: 7.8 mCi F-18 FDG was injected intravenously. Full-ring PET imaging was performed from the skull base to thigh after the radiotracer. CT data  was obtained and used for attenuation correction and anatomic localization. Fasting blood glucose: 130 mg/dl COMPARISON:  Chest CT 09/24/2021.  Abdominopelvic CT 01/07/2019 FINDINGS: Mild limitations secondary to patient body habitus. Mediastinal blood pool activity: SUV max 3.0 Liver activity: SUV max NA NECK: Left-sided thyroid hypermetabolism at a S.U.V. max of 6.6, without convincing evidence of underlying focal lesion. No cervical nodal hypermetabolism. Incidental CT findings: Bilateral carotid atherosclerosis. CHEST: Hypermetabolic thoracic nodes. Example precarinal node of 11 mm and a S.U.V. max of 4.3 on 41/4. Right hilar node measures a S.U.V. max of 4.6. Prevascular node measures 8 mm and a S.U.V. max of 3.1 on 47/4. Status post right lower lobectomy. Within the dependent right lung area of consolidation is moderate, heterogeneous hypermetabolism, most significant at a S.U.V. max of 7.7 on 50/4. Incidental CT findings: Deferred to recent diagnostic CT. Cardiomegaly. Aortic and coronary artery calcification. Pulmonary artery enlargement again identified. Relatively similar reticulonodular opacities throughout the remaining aerated right upper lobe. ABDOMEN/PELVIS: Pericholecystic right liver lobe hypoattenuating mass measures 4.8 cm and corresponds to mild hypermetabolism at a S.U.V. max of 6.1 on 100/4. Somewhat geographic hypoattenuation within the anterior right hepatic dome measures on the order of 9.5 x 5.8 cm and corresponds to hypermetabolism at a S.U.V. max of 5.6 on 76/4. No abdominopelvic nodal hypermetabolism. Incidental CT findings: Left adrenal thickening. Normal right adrenal gland. Mild renal cortical thinning bilaterally. Dependent gallstones up to 7 mm. Extensive colonic diverticulosis. Fibroid uterus. SKELETON: Multifocal osseous metastasis. Example right sacral lytic lesion of 1.8 cm and a S.U.V. max of 9.6 108/4. A T5 lesion is relatively CT occult at a S.U.V. max of 10.0. Incidental CT  findings: Osteopenia. IMPRESSION: 1. Widespread osseous metastasis. 2. 2 hypermetabolic hepatic lesions. The more caudal has morphology typical of metastasis. The more cephalad has a somewhat geographic CT appearance, but is most likely related to an  atypical appearance of metastatic disease. If the patient received radiation to the right lower chest, radiation induced hepatitis is possible but felt less likely. 3. Status post right lower lobectomy with hypermetabolism within an area of dependent right lung consolidation. Concurrent mildly hypermetabolic thoracic nodes. Findings are most likely related to metachronous primary bronchogenic carcinoma with nodal metastasis. Chronic infection or aspiration with reactive hypermetabolic nodes could look similar. 4. Consider sampling of either an osseous lesion or the more inferior right hepatic lobe mass. 5. Hypermetabolic left thyroid lobe without well-defined dominant mass. Of questionable clinical significance, given comorbidities. Technically, warrants further evaluation with ultrasound. (ref: J Am Coll Radiol. 2015 Feb;12(2): 143-50). 6. Mild limitations secondary to patient body habitus 7. Incidental findings, including: Coronary artery atherosclerosis. Aortic Atherosclerosis (ICD10-I70.0). Cholelithiasis. Fibroid uterus. Electronically Signed   By: Abigail Miyamoto M.D.   On: 11/01/2021 14:41    ASSESSMENT: This is a very pleasant 86 years old never smoker white female with highly suspicious stage IV (T3, N2, M1 C) lung cancer likely non-small cell and likely adenocarcinoma presented with right middle lobe collapse in addition to right hilar and mediastinal lymphadenopathy and metastatic disease to the liver and bone, pending tissue diagnosis.   PLAN: I had a lengthy discussion with the patient and her daughters today about her current condition and further investigation to confirm her diagnosis as well as possible treatment options. I personally and  independently reviewed the scan images and discussed the result and showed the images to the patient and her family. I recommended for the patient to keep her appointment with interventional radiology for consideration of the ultrasound-guided core biopsy of the liver for confirmation of the tissue diagnosis. If the final pathology is consistent with non-small cell lung cancer, adenocarcinoma, we will send her tissue block for molecular studies as well as PD-L1 expression by foundation 1. The patient is scheduled to see Dr. Sondra Come tomorrow for evaluation and consideration of palliative radiotherapy to the painful bone lesion especially in the left shoulder. I will arrange for the patient to come back for follow-up visit in 3 weeks for evaluation and more detailed discussion of her treatment based on the final pathology report and molecular studies. I will be happy to see the patient sooner if the pathology is different from non-small cell lung cancer, adenocarcinoma. She was advised to call immediately if she has any other concerning symptoms in the interval. The patient voices understanding of current disease status and treatment options and is in agreement with the current care plan.  All questions were answered. The patient knows to call the clinic with any problems, questions or concerns. We can certainly see the patient much sooner if necessary.  Thank you so much for allowing me to participate in the care of [place]. I will continue to follow up the patient with you and assist in her care.  The total time spent in the appointment was 60 minutes.  Disclaimer: This note was dictated with voice recognition software. Similar sounding words can inadvertently be transcribed and may not be corrected upon review.   Eilleen Kempf November 13, 2021, 12:12 PM

## 2021-11-14 ENCOUNTER — Other Ambulatory Visit: Payer: Self-pay | Admitting: Radiology

## 2021-11-14 DIAGNOSIS — R2681 Unsteadiness on feet: Secondary | ICD-10-CM | POA: Diagnosis not present

## 2021-11-14 DIAGNOSIS — J690 Pneumonitis due to inhalation of food and vomit: Secondary | ICD-10-CM | POA: Diagnosis not present

## 2021-11-14 DIAGNOSIS — R918 Other nonspecific abnormal finding of lung field: Secondary | ICD-10-CM | POA: Diagnosis not present

## 2021-11-14 DIAGNOSIS — K769 Liver disease, unspecified: Secondary | ICD-10-CM

## 2021-11-15 ENCOUNTER — Other Ambulatory Visit: Payer: Self-pay | Admitting: Internal Medicine

## 2021-11-15 ENCOUNTER — Telehealth: Payer: Self-pay | Admitting: Nurse Practitioner

## 2021-11-15 MED ORDER — PREDNISONE 20 MG PO TABS: 40.0000 mg | ORAL_TABLET | Freq: Every day | ORAL | 0 refills | Status: AC

## 2021-11-15 NOTE — Addendum Note (Signed)
Addended by: Chevis Pretty on: 11/15/2021 03:36 PM   Modules accepted: Orders

## 2021-11-15 NOTE — Telephone Encounter (Signed)
Low dose prednisone sent to pharmacy for cough. 2 tablets a the sametime daily for 5 days. Add extra lasix for couple of days and see is will help swelling.  Meds ordered this encounter  Medications   predniSONE (DELTASONE) 20 MG tablet    Sig: Take 2 tablets (40 mg total) by mouth daily with breakfast for 5 days. 2 po daily for 5 days    Dispense:  10 tablet    Refill:  0    Order Specific Question:   Supervising Provider    Answer:   Worthy Rancher [[id number]]   [place], FNP

## 2021-11-15 NOTE — Telephone Encounter (Signed)
Daughter states that patient has been off Doxy since Monday and her cough is no better.  She is still taking the cough mediation that was sent in.  Daughter also states that patient has been having worsening leg and feet swelling x 3 days.  States she normally has swelling but it normally comes down.

## 2021-11-17 NOTE — Progress Notes (Signed)
Radiation Oncology         [phone]) [phone] ________________________________  Initial Outpatient Consultation  Name: Veronica Paul MRN: [id number]  Date: 11/18/2021  DOB: Mar 21, 1935  IB:BCWUGQ, Mary-Margaret, FNP  Veronica Paul Veronica Graves, DO   REFERRING PHYSICIAN: Garner Nash, DO  DIAGNOSIS: There were no encounter diagnoses.  Highly suspicious stage IV (T3, N2, M1 C) lung cancer likely non-small cell and likely adenocarcinoma presented with right middle lobe collapse in addition to right hilar and mediastinal lymphadenopathy and metastatic disease to the liver and bone, pending tissue diagnosis  HISTORY OF PRESENT ILLNESS::Veronica Paul is a 86 y.o. female who is accompanied by ***. she is seen as a courtesy of VeronicaIcard for an opinion concerning radiation therapy as part of management for her recently diagnosed suspected lung cancer. The patient has a history of  lung cancer status post right lower lobectomy in 1998 under the care of Veronica Paul. She was followed by observation since that time. She also has a history of stroke and subsequent issues with aphasia and dysphagia. She has lost around 36 pounds in the last 3-4 months.   This past June 2023, the patient began complaining of cough and SOB. This prompted a chest x-ray on 08/07/21 which showed a right lung base infiltrated suspicious for pneumonia. She was treated with a course of abx for this.   On 09/24/21, the patient fell out of her wheelchair while at an appointment prompting a visit to the ED. Whole spine CT's performed in the ED on that date incidentally revealed a small right pleural effusion and reticulonodular opacities in the visualized right dependent lung, with questionable soft tissue prominence in the hilar region. CT scan on the chest performed on that same date further revealed postoperative changes of the right lower lobectomy and complete atelectatic collapse of the right middle lobe. CT also demonstrated:  peribronchovascular patchy and reticular nodular airspace opacities in the inferior posterior right upper lobe and irregular nodular pleural thickening along the posterior aspect of the right hemothorax; enlarged precarinal and right hilar nodes with additional soft tissue density at the right hilum possibly reflecting a conglomerate of additional hilar nodes; and nodular pleural thickening in the right hemothorax that could represent malignancy with a mass at the right hilum.     Accordingly, the patient was referred to Veronica Paul on 10/03/21 for further evaluation and management. Veronica Paul reviewed imaging with the patient and noted CT findings as most consistent with an aspiration pneumonia with reactive adenopathy. Before moving forward with biopsies, Veronica Paul recommended PET imaging.   PET scan on 10/31/2021 further revealed: hypermetabolic thoracic nodes including a precarinal node measuring 1.1 cm with SUV max of 4.3; a right hilar node with SUV max of 4.6; a prevascular node measuring 0.8 cm with an SUV of 3.1; moderate heterogeneous hypermetabolism with an SUV max of 7.7 within the dependent right lung area of consolidation; pericholecystic right liver lobe hypoattenuating mass measuring 4.8 cm and corresponding to mild hypermetabolism with an SUV max of 6.1; some geographic hypoattenuation within the anterior right hepatic dome measuring 9.5 x 5.8 cm corresponding to a hypermetabolism with an SUV max of 5.6; multifocal osseous metastasis including a right sacral lytic lesion measuring 1.8 cm with an SUV max of 9.6; and a T5 lesion with an SUV max of 10.0.  Given PET findings concerning for widespread metastatic disease, Veronica Paul recommended proceeding with sampling of either one of the osseous lesions or the more inferior right  hepatic lobe mass. During this visit, the patient was also noted to have an increase in green secretions since coming off of all of her antibiotics. Accordingly, she was given  doxycycline 100 mg twice daily for 7 days.   Thyroid US on 11/12/21 showed findings consistent with post thyroid lobectomy without evidence of locally recurrent or residual disease. [place] also showed punctate sub-centimeter isoechoic ill-defined nodules/pseudo nodules within the left lobe of the thyroid isthmus which do not meet imaging criteria for sampling.     The patient met with Veronica Paul on 11/13/21. He will order PD-L1 testing and foundation 1 testing if final pathology comes back consistent with non-small cell lung cancer, adenocarcinoma.   Of note: The patient is scheduled for ultrasound-guided core biopsy of one of the liver lesions on November 18, 2021.  PREVIOUS RADIATION THERAPY: No  PAST MEDICAL HISTORY:  Past Medical History:  Diagnosis Date   Allergy    "my nose runs alot"   Anxiety    Arthritis    Atrial fibrillation ([place])    Blood transfusion without reported diagnosis    "when I had a miscarrage in 1957"   Cataract    bilateral removed   Cholelithiasis    Constipation    x ray showed increased stool in colon- uses Miralax daily and Dulcolax twice a week    Depression    Diabetes mellitus    Dyslipidemia    HTN (hypertension)    Hx of long-term (current) use of anticoagulants    Hyperlipidemia    Hypertension    Hypothyroidism    Lung cancer ([place])    Obesity    Osteopenia    Vitamin D deficiency     PAST SURGICAL HISTORY: Past Surgical History:  Procedure Laterality Date   CATARACT EXTRACTION Bilateral    COLONOSCOPY     COLONOSCOPY WITH PROPOFOL N/A 05/06/2019   Procedure: COLONOSCOPY WITH PROPOFOL;  Surgeon: Jerene Bears, MD;  Location: [facility];  Service: Gastroenterology;  Laterality: N/A;   ESOPHAGOGASTRODUODENOSCOPY (EGD) WITH PROPOFOL N/A 08/11/2021   Procedure: ESOPHAGOGASTRODUODENOSCOPY (EGD) WITH PROPOFOL;  Surgeon: Harvel Quale, MD;  Location: [facility];  Service: Gastroenterology;  Laterality: N/A;   IR RADIOLOGIST EVAL &  MGMT  11/11/2021   LUNG REMOVAL, PARTIAL  1998   Right   POLYPECTOMY  05/06/2019   Procedure: POLYPECTOMY;  Surgeon: Jerene Bears, MD;  Location: [facility];  Service: Gastroenterology;;   Azzie Almas DILATION  08/11/2021   Procedure: Azzie Almas DILATION;  Surgeon: Montez Morita, Quillian Quince, MD;  Location: [facility];  Service: Gastroenterology;;   THYROIDECTOMY     TUBAL LIGATION     UPPER GASTROINTESTINAL ENDOSCOPY      FAMILY HISTORY:  Family History  Problem Relation Age of Onset   Coronary artery disease Brother 59   Hypertension Brother    Heart disease Brother    Hypertension Mother    Hypertension Father    Heart disease Father    Hypertension Sister    Heart disease Sister    Cancer Sister    Arthritis Daughter    COPD Daughter    Fibromyalgia Daughter    Ulcerative colitis Son    Stroke Maternal Grandmother    Cancer Brother    Lung cancer Brother    Hypertension Sister    Cancer Sister    Colon cancer Neg Hx    Food intolerance Neg Hx    Esophageal cancer Neg Hx    Rectal cancer Neg Hx  Stomach cancer Neg Hx    Colon polyps Neg Hx     SOCIAL HISTORY:  Social History   Tobacco Use   Smoking status: Never   Smokeless tobacco: Never   Tobacco comments:    Never smoke 05/15/21  Vaping Use   Vaping Use: Never used  Substance Use Topics   Alcohol use: No   Drug use: No    ALLERGIES:  Allergies  Allergen Reactions   Ace Inhibitors Cough   Feldene [Piroxicam]     REACTION: RASH TO HANDS   Lexapro [Escitalopram]    Meloxicam Nausea And Vomiting   Prevnar [Pneumococcal 13-Val Conj Vacc]    Statins Other (See Comments)    myalgia   Sulfa Antibiotics     Rash    Zithromax [Azithromycin Dihydrate] Itching    MEDICATIONS:  Current Outpatient Medications  Medication Sig Dispense Refill   acetaminophen (TYLENOL) 500 MG tablet Take 500 mg by mouth every 6 (six) hours as needed for moderate pain, headache or fever.     benzonatate (TESSALON PERLES)  100 MG capsule Take 1 capsule (100 mg total) by mouth 3 (three) times daily as needed. (Patient not taking: Reported on 11/13/2021) 20 capsule 0   bisacodyl (DULCOLAX) 5 MG EC tablet Take 5 mg by mouth daily as needed for moderate constipation (usually twice per week). (Patient not taking: Reported on 11/13/2021)     Blood Glucose Monitoring Suppl (ONETOUCH VERIO) w/Device KIT Check blood sugars daily Dx E11.9 (Patient not taking: Reported on 11/13/2021) 1 kit 0   [employer] MELATONIN 10 MG CAPS TAKE 1 CAPSULE BY MOUTH EVERY DAY AT BEDTIME AS NEEDED (Patient taking differently: Take 1 capsule by mouth at bedtime.) 90 capsule 1   diltiazem (CARDIZEM) 90 MG tablet TAKE 1 TABLET (90 MG TOTAL) BY MOUTH 3 (THREE) TIMES DAILY. 270 tablet 1   ELIQUIS 5 MG TABS tablet TAKE 1 TABLET BY MOUTH TWICE A DAY 180 tablet 1   feeding supplement (ENSURE ENLIVE / ENSURE PLUS) LIQD Take 237 mLs by mouth 3 (three) times daily between meals. 237 mL 12   furosemide (LASIX) 20 MG tablet TAKE 1 TABLET BY MOUTH EVERY DAY 90 tablet 0   glucose blood (ONETOUCH VERIO) test strip CHECK BLOOD SUGARS DAILY E11.9 100 strip 3   Lancet Devices (ONETOUCH DELICA PLUS LANCING) MISC 1 Device by Does not apply route daily. 1 each 0   Lancets (ONETOUCH DELICA PLUS GBEEFE07H) MISC USE TO CHECK BLOOD SUGARS DAILY DX E11.9 100 each 3   levothyroxine (SYNTHROID) 88 MCG tablet Take 1 tablet (88 mcg total) by mouth daily. 90 tablet 1   metoprolol tartrate (LOPRESSOR) 100 MG tablet TAKE 1.5 TABLETS BY MOUTH 2 TIMES DAILY. (Patient taking differently: Take 100 mg by mouth See admin instructions. Take 1.5 tablet by mouth twice a day) 270 tablet 1   Multiple Vitamin (MULTIVITAMIN) tablet Take 1 tablet by mouth daily.     nitroGLYCERIN (NITROSTAT) 0.4 MG SL tablet Place 1 tablet (0.4 mg total) under the tongue every 5 (five) minutes as needed for chest pain. (Patient not taking: Reported on 11/13/2021) 25 tablet 1   nystatin (MYCOSTATIN/NYSTOP) powder Apply 1  application. topically 3 (three) times daily. (Patient not taking: Reported on 11/05/2021) 15 g 1   omeprazole (PRILOSEC) 20 MG capsule TAKE 1 CAPSULE BY MOUTH EVERY DAY 90 capsule 0   ondansetron (ZOFRAN-ODT) 4 MG disintegrating tablet Take 1 tablet (4 mg total) by mouth every 8 (eight) hours as needed  for nausea or vomiting. (Patient not taking: Reported on 11/05/2021) 20 tablet 1   oxyCODONE-acetaminophen (PERCOCET/ROXICET) 5-325 MG tablet Take 1 tablet by mouth every 6 (six) hours as needed for severe pain. (Patient not taking: Reported on 11/05/2021) 15 tablet 0   polyethylene glycol (MIRALAX / GLYCOLAX) 17 g packet Take 17 g by mouth daily as needed for mild constipation.     predniSONE (DELTASONE) 20 MG tablet Take 2 tablets (40 mg total) by mouth daily with breakfast for 5 days. 2 po daily for 5 days 10 tablet 0   promethazine-dextromethorphan (PROMETHAZINE-DM) 6.25-15 MG/5ML syrup Take 5 mLs by mouth 4 (four) times daily as needed for cough. 118 mL 0   sertraline (ZOLOFT) 50 MG tablet TAKE 1 TABLET BY MOUTH EVERY DAY 90 tablet 0   No current facility-administered medications for this encounter.    REVIEW OF SYSTEMS:  A 10+ POINT REVIEW OF SYSTEMS WAS OBTAINED including neurology, dermatology, psychiatry, cardiac, respiratory, lymph, extremities, GI, GU, musculoskeletal, constitutional, reproductive, HEENT. ***   PHYSICAL EXAM:  vitals were not taken for this visit.   General: Alert and oriented, in no acute distress HEENT: Head is normocephalic. Extraocular movements are intact. Oropharynx is clear. Neck: Neck is supple, no palpable cervical or supraclavicular lymphadenopathy. Heart: Regular in rate and rhythm with no murmurs, rubs, or gallops. Chest: Clear to auscultation bilaterally, with no rhonchi, wheezes, or rales. Abdomen: Soft, nontender, nondistended, with no rigidity or guarding. Extremities: No cyanosis or edema. Lymphatics: see Neck Exam Skin: No concerning  lesions. Musculoskeletal: symmetric strength and muscle tone throughout. Neurologic: Cranial nerves II through XII are grossly intact. No obvious focalities. Speech is fluent. Coordination is intact. Psychiatric: Judgment and insight are intact. Affect is appropriate. ***  ECOG = ***  0 - Asymptomatic (Fully active, able to carry on all predisease activities without restriction)  1 - Symptomatic but completely ambulatory (Restricted in physically strenuous activity but ambulatory and able to carry out work of a light or sedentary nature. For example, light housework, office work)  2 - Symptomatic, <50% in bed during the day (Ambulatory and capable of all self care but unable to carry out any work activities. Up and about more than 50% of waking hours)  3 - Symptomatic, >50% in bed, but not bedbound (Capable of only limited self-care, confined to bed or chair 50% or more of waking hours)  4 - Bedbound (Completely disabled. Cannot carry on any self-care. Totally confined to bed or chair)  5 - Death   Eustace Pen MM, Creech RH, Tormey DC, et al. [phone]). "Toxicity and response criteria of the [facility]". [place] Oncol. 5 (6): 649-55  LABORATORY DATA:  Lab Results  Component Value Date   WBC 8.8 11/13/2021   HGB 14.0 11/13/2021   HCT 43.2 11/13/2021   MCV 86.7 11/13/2021   PLT 298 11/13/2021   NEUTROABS 5.1 11/13/2021   Lab Results  Component Value Date   NA 141 11/13/2021   K 4.0 11/13/2021   CL 104 11/13/2021   CO2 34 (H) 11/13/2021   GLUCOSE 95 11/13/2021   BUN 19 11/13/2021   CREATININE 0.92 11/13/2021   CALCIUM 8.8 (L) 11/13/2021      RADIOGRAPHY: US THYROID  Result Date: 11/12/2021 CLINICAL DATA:  Incidental on PET. History of right thyroid lobectomy, now with diffuse increased metabolic activity within the remaining thyroid parenchyma on PET-CT performed 10/31/2021. EXAM: THYROID ULTRASOUND TECHNIQUE: Ultrasound examination of the thyroid gland  and adjacent  soft tissues was performed. COMPARISON:  PET-CT-10/31/2021 FINDINGS: Parenchymal Echotexture: Mildly heterogenous Isthmus: Normal in size measuring 0.3 cm in diameter Right lobe: Surgically absent. There is no residual nodular tissue within the right lobectomy resection bed. Left lobe: Atrophic measuring 3.7 x 1.6 x 1.4 cm _________________________________________________________ Estimated total number of nodules >/= 1 cm: 0 Number of spongiform nodules >/=  2 cm not described below (TR1): 0 Number of mixed cystic and solid nodules >/= 1.5 cm not described below (TR2): 0 _________________________________________________________ Personal review of preceding PET-CT demonstrates diffuse increased metabolic activity within the remaining left lobe of the thyroid without focality (representative image 32, series 604). _________________________________________________________ There is an approximately 0.8 x 0.7 x 0.7 cm isoechoic ill-defined nodule/pseudonodule within the mid, medial aspect of the left lobe of the thyroid (labeled 1), which does not meet imaging criteria to recommend percutaneous sampling or continued dedicated follow-up. There is an approximately 1.3 x 1.3 x 0.8 cm isoechoic ill-defined nodule/pseudonodule within the left side of the thyroid isthmus (labeled 2), which does not meet criteria to recommend percutaneous sampling or continued dedicated follow-up. IMPRESSION: 1. Post right thyroid lobectomy without evidence of locally recurrent or residual disease. 2. Personal review of preceding PET-CT demonstrates diffuse increased metabolic activity within the remaining thyroid parenchyma without focality, nonspecific though could be seen in the setting of a thyroiditis. 3. Punctate (sub 1.3 cm) isoechoic ill-defined nodules/pseudo nodules within the left lobe of the thyroid isthmus do not meet imaging criteria to recommend percutaneous sampling or continued dedicated follow-up. The above is in  keeping with the ACR TI-RADS recommendations - J Am Coll Radiol 2017;14:587-595. Electronically Signed   By: Sandi Mariscal M.D.   On: 11/12/2021 11:54   IR Radiologist Eval & Mgmt  Result Date: 11/11/2021 Please refer to notes tab for details about interventional procedure. (Op Note)  NM PET Image Initial (PI) Skull Base To Thigh (F-18 FDG)  Result Date: 11/01/2021 CLINICAL DATA:  Initial treatment strategy for history of right-sided lobectomy 25 years ago. Abnormal chest CT with prominent nodes, pleural thickening, and reticulonodular pulmonary opacities EXAM: NUCLEAR MEDICINE PET SKULL BASE TO THIGH TECHNIQUE: 7.8 mCi F-18 FDG was injected intravenously. Full-ring PET imaging was performed from the skull base to thigh after the radiotracer. CT data was obtained and used for attenuation correction and anatomic localization. Fasting blood glucose: 130 mg/dl COMPARISON:  Chest CT 09/24/2021.  Abdominopelvic CT 01/07/2019 FINDINGS: Mild limitations secondary to patient body habitus. Mediastinal blood pool activity: SUV max 3.0 Liver activity: SUV max NA NECK: Left-sided thyroid hypermetabolism at a S.U.V. max of 6.6, without convincing evidence of underlying focal lesion. No cervical nodal hypermetabolism. Incidental CT findings: Bilateral carotid atherosclerosis. CHEST: Hypermetabolic thoracic nodes. Example precarinal node of 11 mm and a S.U.V. max of 4.3 on 41/4. Right hilar node measures a S.U.V. max of 4.6. Prevascular node measures 8 mm and a S.U.V. max of 3.1 on 47/4. Status post right lower lobectomy. Within the dependent right lung area of consolidation is moderate, heterogeneous hypermetabolism, most significant at a S.U.V. max of 7.7 on 50/4. Incidental CT findings: Deferred to recent diagnostic CT. Cardiomegaly. Aortic and coronary artery calcification. Pulmonary artery enlargement again identified. Relatively similar reticulonodular opacities throughout the remaining aerated right upper lobe.  ABDOMEN/PELVIS: Pericholecystic right liver lobe hypoattenuating mass measures 4.8 cm and corresponds to mild hypermetabolism at a S.U.V. max of 6.1 on 100/4. Somewhat geographic hypoattenuation within the anterior right hepatic dome measures on the order of 9.5 x 5.8 cm  and corresponds to hypermetabolism at a S.U.V. max of 5.6 on 76/4. No abdominopelvic nodal hypermetabolism. Incidental CT findings: Left adrenal thickening. Normal right adrenal gland. Mild renal cortical thinning bilaterally. Dependent gallstones up to 7 mm. Extensive colonic diverticulosis. Fibroid uterus. SKELETON: Multifocal osseous metastasis. Example right sacral lytic lesion of 1.8 cm and a S.U.V. max of 9.6 108/4. A T5 lesion is relatively CT occult at a S.U.V. max of 10.0. Incidental CT findings: Osteopenia. IMPRESSION: 1. Widespread osseous metastasis. 2. 2 hypermetabolic hepatic lesions. The more caudal has morphology typical of metastasis. The more cephalad has a somewhat geographic CT appearance, but is most likely related to an atypical appearance of metastatic disease. If the patient received radiation to the right lower chest, radiation induced hepatitis is possible but felt less likely. 3. Status post right lower lobectomy with hypermetabolism within an area of dependent right lung consolidation. Concurrent mildly hypermetabolic thoracic nodes. Findings are most likely related to metachronous primary bronchogenic carcinoma with nodal metastasis. Chronic infection or aspiration with reactive hypermetabolic nodes could look similar. 4. Consider sampling of either an osseous lesion or the more inferior right hepatic lobe mass. 5. Hypermetabolic left thyroid lobe without well-defined dominant mass. Of questionable clinical significance, given comorbidities. Technically, warrants further evaluation with ultrasound. (ref: J Am Coll Radiol. 2015 Feb;12(2): 143-50). 6. Mild limitations secondary to patient body habitus 7. Incidental  findings, including: Coronary artery atherosclerosis. Aortic Atherosclerosis (ICD10-I70.0). Cholelithiasis. Fibroid uterus. Electronically Signed   By: Abigail Miyamoto M.D.   On: 11/01/2021 14:41      IMPRESSION: Highly suspicious stage IV (T3, N2, M1 C) lung cancer likely non-small cell and likely adenocarcinoma presented with right middle lobe collapse in addition to right hilar and mediastinal lymphadenopathy and metastatic disease to the liver and bone, pending tissue diagnosis  ***  Today, I talked to the patient and family about the findings and work-up thus far.  We discussed the natural history of *** and general treatment, highlighting the role of radiotherapy in the management.  We discussed the available radiation techniques, and focused on the details of logistics and delivery.  We reviewed the anticipated acute and late sequelae associated with radiation in this setting.  The patient was encouraged to ask questions that I answered to the best of my ability. *** A patient consent form was discussed and signed.  We retained a copy for our records.  The patient would like to proceed with radiation and will be scheduled for CT simulation.  PLAN: ***    *** minutes of total time was spent for this patient encounter, including preparation, face-to-face counseling with the patient and coordination of care, physical exam, and documentation of the encounter.   ------------------------------------------------  Blair Promise, PhD, MD  This document serves as a record of services personally performed by Gery Pray, MD. It was created on his behalf by Roney Mans, a trained medical scribe. The creation of this record is based on the scribe's personal observations and the provider's statements to them. This document has been checked and approved by the attending provider.

## 2021-11-18 ENCOUNTER — Ambulatory Visit (HOSPITAL_COMMUNITY)
Admission: RE | Admit: 2021-11-18 | Discharge: 2021-11-18 | Disposition: A | Discharge status: Home / Self Care | Payer: Medicare HMO | Source: Ambulatory Visit | Attending: Acute Care | Admitting: Acute Care

## 2021-11-18 ENCOUNTER — Ambulatory Visit
Admission: RE | Admit: 2021-11-18 | Discharge: 2021-11-18 | Disposition: A | Discharge status: Home / Self Care | Payer: Medicare HMO | Source: Ambulatory Visit | Attending: Radiation Oncology | Admitting: Radiation Oncology

## 2021-11-18 ENCOUNTER — Telehealth: Payer: Self-pay | Admitting: Acute Care

## 2021-11-18 ENCOUNTER — Encounter: Payer: Self-pay | Admitting: Radiation Oncology

## 2021-11-18 ENCOUNTER — Other Ambulatory Visit: Payer: Self-pay

## 2021-11-18 ENCOUNTER — Encounter (HOSPITAL_COMMUNITY): Payer: Self-pay

## 2021-11-18 ENCOUNTER — Telehealth: Payer: Self-pay | Admitting: Internal Medicine

## 2021-11-18 VITALS — BP 131/64 | HR 72 | Temp 98.1°F | Resp 18 | Ht 63.0 in

## 2021-11-18 DIAGNOSIS — I4891 Unspecified atrial fibrillation: Secondary | ICD-10-CM | POA: Diagnosis not present

## 2021-11-18 DIAGNOSIS — C787 Secondary malignant neoplasm of liver and intrahepatic bile duct: Secondary | ICD-10-CM | POA: Insufficient documentation

## 2021-11-18 DIAGNOSIS — C801 Malignant (primary) neoplasm, unspecified: Secondary | ICD-10-CM | POA: Diagnosis not present

## 2021-11-18 DIAGNOSIS — E785 Hyperlipidemia, unspecified: Secondary | ICD-10-CM | POA: Insufficient documentation

## 2021-11-18 DIAGNOSIS — Z79899 Other long term (current) drug therapy: Secondary | ICD-10-CM | POA: Diagnosis not present

## 2021-11-18 DIAGNOSIS — I2510 Atherosclerotic heart disease of native coronary artery without angina pectoris: Secondary | ICD-10-CM | POA: Insufficient documentation

## 2021-11-18 DIAGNOSIS — I10 Essential (primary) hypertension: Secondary | ICD-10-CM | POA: Insufficient documentation

## 2021-11-18 DIAGNOSIS — E039 Hypothyroidism, unspecified: Secondary | ICD-10-CM | POA: Insufficient documentation

## 2021-11-18 DIAGNOSIS — C3431 Malignant neoplasm of lower lobe, right bronchus or lung: Secondary | ICD-10-CM | POA: Diagnosis not present

## 2021-11-18 DIAGNOSIS — Z801 Family history of malignant neoplasm of trachea, bronchus and lung: Secondary | ICD-10-CM | POA: Insufficient documentation

## 2021-11-18 DIAGNOSIS — M8580 Other specified disorders of bone density and structure, unspecified site: Secondary | ICD-10-CM | POA: Insufficient documentation

## 2021-11-18 DIAGNOSIS — Z85118 Personal history of other malignant neoplasm of bronchus and lung: Secondary | ICD-10-CM | POA: Insufficient documentation

## 2021-11-18 DIAGNOSIS — C7951 Secondary malignant neoplasm of bone: Secondary | ICD-10-CM | POA: Insufficient documentation

## 2021-11-18 DIAGNOSIS — I69320 Aphasia following cerebral infarction: Secondary | ICD-10-CM | POA: Insufficient documentation

## 2021-11-18 DIAGNOSIS — K7689 Other specified diseases of liver: Secondary | ICD-10-CM | POA: Diagnosis not present

## 2021-11-18 DIAGNOSIS — R590 Localized enlarged lymph nodes: Secondary | ICD-10-CM | POA: Diagnosis not present

## 2021-11-18 DIAGNOSIS — E559 Vitamin D deficiency, unspecified: Secondary | ICD-10-CM | POA: Insufficient documentation

## 2021-11-18 DIAGNOSIS — C3411 Malignant neoplasm of upper lobe, right bronchus or lung: Secondary | ICD-10-CM | POA: Insufficient documentation

## 2021-11-18 DIAGNOSIS — D259 Leiomyoma of uterus, unspecified: Secondary | ICD-10-CM | POA: Diagnosis not present

## 2021-11-18 DIAGNOSIS — Z800 Family history of malignant neoplasm of digestive organs: Secondary | ICD-10-CM | POA: Diagnosis not present

## 2021-11-18 DIAGNOSIS — E119 Type 2 diabetes mellitus without complications: Secondary | ICD-10-CM | POA: Diagnosis not present

## 2021-11-18 DIAGNOSIS — K769 Liver disease, unspecified: Secondary | ICD-10-CM

## 2021-11-18 LAB — COMPREHENSIVE METABOLIC PANEL WITH GFR
ALT: 50 U/L — ABNORMAL HIGH (ref 0–44)
AST: 134 U/L — ABNORMAL HIGH (ref 15–41)
Albumin: 3.5 g/dL (ref 3.5–5.0)
Alkaline Phosphatase: 180 U/L — ABNORMAL HIGH (ref 38–126)
Anion gap: 9 (ref 5–15)
BUN: 22 mg/dL (ref 8–23)
CO2: 28 mmol/L (ref 22–32)
Calcium: 8.7 mg/dL — ABNORMAL LOW (ref 8.9–10.3)
Chloride: 102 mmol/L (ref 98–111)
Creatinine, Ser: 0.84 mg/dL (ref 0.44–1.00)
GFR, Estimated: >60 mL/min (ref >60)
Glucose, Bld: 101 mg/dL — ABNORMAL HIGH (ref 70–99)
Potassium: 4.6 mmol/L (ref 3.5–5.1)
Sodium: 139 mmol/L (ref 135–145)
Total Bilirubin: 1 mg/dL (ref 0.3–1.2)
Total Protein: 7.4 g/dL (ref 6.5–8.1)

## 2021-11-18 LAB — CBC WITH DIFFERENTIAL/PLATELET
Abs Immature Granulocytes: 0.03 K/uL (ref 0.00–0.07)
Basophils Absolute: 0.1 K/uL (ref 0.0–0.1)
Basophils Relative: 1 %
Eosinophils Absolute: 1.1 K/uL — ABNORMAL HIGH (ref 0.0–0.5)
Eosinophils Relative: 12 %
HCT: 43.9 % (ref 36.0–46.0)
Hemoglobin: 13.8 g/dL (ref 12.0–15.0)
Immature Granulocytes: 0 %
Lymphocytes Relative: 20 %
Lymphs Abs: 1.8 K/uL (ref 0.7–4.0)
MCH: 27.9 pg (ref 26.0–34.0)
MCHC: 31.4 g/dL (ref 30.0–36.0)
MCV: 88.7 fL (ref 80.0–100.0)
Monocytes Absolute: 0.7 K/uL (ref 0.1–1.0)
Monocytes Relative: 8 %
Neutro Abs: 5.5 K/uL (ref 1.7–7.7)
Neutrophils Relative %: 59 %
Platelets: 303 K/uL (ref 150–400)
RBC: 4.95 MIL/uL (ref 3.87–5.11)
RDW: 14.6 % (ref 11.5–15.5)
WBC: 9.3 K/uL (ref 4.0–10.5)
nRBC: 0 % (ref 0.0–0.2)

## 2021-11-18 LAB — PROTIME-INR
INR: 1.1 (ref 0.8–1.2)
Prothrombin Time: 13.9 s (ref 11.4–15.2)

## 2021-11-18 MED ORDER — GELATIN ABSORBABLE 12-7 MM EX MISC
CUTANEOUS | Status: AC
  Filled 2021-11-18: qty 1

## 2021-11-18 MED ORDER — GELATIN ABSORBABLE 12-7 MM EX MISC
CUTANEOUS | Status: AC | PRN
  Administered 2021-11-18: 1 via TOPICAL

## 2021-11-18 MED ORDER — FENTANYL CITRATE (PF) 100 MCG/2ML IJ SOLN
INTRAMUSCULAR | Status: AC
  Filled 2021-11-18: qty 2

## 2021-11-18 MED ORDER — LIDOCAINE HCL 1 % IJ SOLN
INTRAMUSCULAR | Status: AC
  Filled 2021-11-18: qty 20

## 2021-11-18 MED ORDER — LIDOCAINE HCL 1 % IJ SOLN
INTRAMUSCULAR | Status: AC | PRN
  Administered 2021-11-18: 5 mL via INTRADERMAL

## 2021-11-18 MED ORDER — SODIUM CHLORIDE 0.9 % IV SOLN: INTRAVENOUS | Status: DC

## 2021-11-18 MED ORDER — MIDAZOLAM HCL 2 MG/2ML IJ SOLN
INTRAMUSCULAR | Status: AC
  Filled 2021-11-18: qty 2

## 2021-11-18 NOTE — H&P (Signed)
Referring Physician(s): Magdalen Spatz  Supervising Physician: Jacqulynn Cadet  Patient Status:  [facility] OP  Chief Complaint:  "I'm getting a liver biopsy"  Subjective: Pt familiar to IR service from recent telemedicine consultation with Dr. Pascal Lux on 11/11/21 to discuss management of newly discovered liver lesions.  She is an 86 y.o. female with complex past medical history significant for atrial fibrillation (on Eliquis), diabetes, hyperlipidemia, hypertension and hypothyroidism who was found to have enlarged precarinal and right hilar lymph nodes on chest CT performed 09/24/2021 with a subsequent PET/CT demonstrating two hypermetabolic liver lesions worrisome for metastatic disease. Full PET scan results as follows:  1. Widespread osseous metastasis. 2. 2 hypermetabolic hepatic lesions. The more caudal has morphology typical of metastasis. The more cephalad has a somewhat geographic CT appearance, but is most likely related to an atypical appearance of metastatic disease. If the patient received radiation to the right lower chest, radiation induced hepatitis is possible but felt less likely. 3. Status post right lower lobectomy with hypermetabolism within an area of dependent right lung consolidation. Concurrent mildly hypermetabolic thoracic nodes. Findings are most likely related to metachronous primary bronchogenic carcinoma with nodal metastasis. Chronic infection or aspiration with reactive hypermetabolic nodes could look similar. 4. Consider sampling of either an osseous lesion or the more inferior right hepatic lobe mass. 5. Hypermetabolic left thyroid lobe without well-defined dominant mass. Of questionable clinical significance, given comorbidities. Technically, warrants further evaluation with ultrasound. (ref: J Am Coll Radiol. 2015 Feb;12(2): 143-50). 6. Mild limitations secondary to patient body habitus 7. Incidental findings, including: Coronary artery  atherosclerosis. Aortic Atherosclerosis (ICD10-I70.0). Cholelithiasis. Fibroid uterus.  Following discussion with Dr. Pascal Lux pt presents today for image guided right liver lesion biopsy vs right sacral lesion biopsy. She currently denies fever,HA,CP, N/V . She does have some dyspnea, cough-occ with blood-tinged sputum, abd/back pain, left shoulder pain.   Past Medical History:  Diagnosis Date   Allergy    "my nose runs alot"   Anxiety    Arthritis    Atrial fibrillation ([place])    Blood transfusion without reported diagnosis    "when I had a miscarrage in 1957"   Cataract    bilateral removed   Cholelithiasis    Constipation    x ray showed increased stool in colon- uses Miralax daily and Dulcolax twice a week    Depression    Diabetes mellitus    Dyslipidemia    HTN (hypertension)    Hx of long-term (current) use of anticoagulants    Hyperlipidemia    Hypertension    Hypothyroidism    Lung cancer ([place])    Obesity    Osteopenia    Vitamin D deficiency    Past Surgical History:  Procedure Laterality Date   CATARACT EXTRACTION Bilateral    COLONOSCOPY     COLONOSCOPY WITH PROPOFOL N/A 05/06/2019   Procedure: COLONOSCOPY WITH PROPOFOL;  Surgeon: Jerene Bears, MD;  Location: [facility];  Service: Gastroenterology;  Laterality: N/A;   ESOPHAGOGASTRODUODENOSCOPY (EGD) WITH PROPOFOL N/A 08/11/2021   Procedure: ESOPHAGOGASTRODUODENOSCOPY (EGD) WITH PROPOFOL;  Surgeon: Harvel Quale, MD;  Location: [facility];  Service: Gastroenterology;  Laterality: N/A;   IR RADIOLOGIST EVAL & MGMT  11/11/2021   LUNG REMOVAL, PARTIAL  1998   Right   POLYPECTOMY  05/06/2019   Procedure: POLYPECTOMY;  Surgeon: Jerene Bears, MD;  Location: [facility];  Service: Gastroenterology;;   Azzie Almas DILATION  08/11/2021   Procedure: Azzie Almas DILATION;  Surgeon: Montez Morita,  Quillian Quince, MD;  Location: [facility];  Service: Gastroenterology;;   THYROIDECTOMY     TUBAL LIGATION     UPPER  GASTROINTESTINAL ENDOSCOPY       Allergies: Ace inhibitors, Feldene [piroxicam], Lexapro [escitalopram], Meloxicam, Prevnar [pneumococcal 13-val conj vacc], Statins, Sulfa antibiotics, and Zithromax [azithromycin dihydrate]  Medications: Prior to Admission medications   Medication Sig Start Date End Date Taking? Authorizing Provider  acetaminophen (TYLENOL) 500 MG tablet Take 500 mg by mouth every 6 (six) hours as needed for moderate pain, headache or fever.   Yes [provider]  [facility] MELATONIN 10 MG CAPS TAKE 1 CAPSULE BY MOUTH EVERY DAY AT BEDTIME AS NEEDED Patient taking differently: Take 1 capsule by mouth at bedtime. 09/13/21  Yes Martin, Mary-Margaret, FNP  diltiazem (CARDIZEM) 90 MG tablet TAKE 1 TABLET (90 MG TOTAL) BY MOUTH 3 (THREE) TIMES DAILY. 09/13/21  Yes Hassell Done, Mary-Margaret, FNP  ELIQUIS 5 MG TABS tablet TAKE 1 TABLET BY MOUTH TWICE A DAY 09/09/21  Yes Hassell Done, Mary-Margaret, FNP  feeding supplement (ENSURE ENLIVE / ENSURE PLUS) LIQD Take 237 mLs by mouth 3 (three) times daily between meals. 02/27/21  Yes Atway, Rayann N, DO  furosemide (LASIX) 20 MG tablet TAKE 1 TABLET BY MOUTH EVERY DAY 10/25/21  Yes Hassell Done, Mary-Margaret, FNP  levothyroxine (SYNTHROID) 88 MCG tablet Take 1 tablet (88 mcg total) by mouth daily. 06/24/21  Yes Martin, Mary-Margaret, FNP  metoprolol tartrate (LOPRESSOR) 100 MG tablet TAKE 1.5 TABLETS BY MOUTH 2 TIMES DAILY. Patient taking differently: Take 100 mg by mouth See admin instructions. Take 1.5 tablet by mouth twice a day 09/13/21  Yes Hassell Done, Mary-Margaret, FNP  Multiple Vitamin (MULTIVITAMIN) tablet Take 1 tablet by mouth daily.   Yes [provider]  omeprazole (PRILOSEC) 20 MG capsule TAKE 1 CAPSULE BY MOUTH EVERY DAY 10/25/21  Yes Hassell Done, Mary-Margaret, FNP  promethazine-dextromethorphan (PROMETHAZINE-DM) 6.25-15 MG/5ML syrup Take 5 mLs by mouth 4 (four) times daily as needed for cough. 11/08/21  Yes Martin, Mary-Margaret, FNP  sertraline  (ZOLOFT) 50 MG tablet TAKE 1 TABLET BY MOUTH EVERY DAY 10/25/21  Yes Hassell Done, Mary-Margaret, FNP  benzonatate (TESSALON PERLES) 100 MG capsule Take 1 capsule (100 mg total) by mouth 3 (three) times daily as needed. Patient not taking: Reported on 11/13/2021 11/07/21   Chevis Pretty, FNP  bisacodyl (DULCOLAX) 5 MG EC tablet Take 5 mg by mouth daily as needed for moderate constipation (usually twice per week). Patient not taking: Reported on 11/13/2021    [provider]  Blood Glucose Monitoring [facility] VERIO) w/Device KIT Check blood sugars daily Dx E11.9 11/07/21   Chevis Pretty, FNP  glucose blood [facility] VERIO) test strip CHECK BLOOD SUGARS DAILY E11.9 11/07/21   Chevis Pretty, FNP  Lancet Devices [facility] DELICA PLUS LANCING) MISC 1 Device by Does not apply route daily. 03/01/20   Chevis Pretty, FNP  Lancets [facility] DELICA PLUS [id number]) MISC USE TO CHECK BLOOD SUGARS DAILY DX E11.9 11/07/21   Hassell Done, Mary-Margaret, FNP  nitroGLYCERIN (NITROSTAT) 0.4 MG SL tablet Place 1 tablet (0.4 mg total) under the tongue every 5 (five) minutes as needed for chest pain. Patient not taking: Reported on 11/13/2021 11/21/20   Chevis Pretty, FNP  nystatin (MYCOSTATIN/NYSTOP) powder Apply 1 application. topically 3 (three) times daily. Patient not taking: Reported on 11/05/2021 04/25/21   Chevis Pretty, FNP  ondansetron (ZOFRAN-ODT) 4 MG disintegrating tablet Take 1 tablet (4 mg total) by mouth every 8 (eight) hours as needed for nausea or vomiting. Patient  not taking: Reported on 11/05/2021 07/18/21   Chevis Pretty, FNP  oxyCODONE-acetaminophen (PERCOCET/ROXICET) 5-325 MG tablet Take 1 tablet by mouth every 6 (six) hours as needed for severe pain. Patient not taking: Reported on 11/05/2021 09/24/21   Hayden Rasmussen, MD  polyethylene glycol (MIRALAX / GLYCOLAX) 17 g packet Take 17 g by mouth daily as needed for mild constipation. Patient not taking:  Reported on 11/18/2021    [provider]  predniSONE (DELTASONE) 20 MG tablet Take 2 tablets (40 mg total) by mouth daily with breakfast for 5 days. 2 po daily for 5 days Patient not taking: Reported on 11/18/2021 11/15/21 11/20/21  Chevis Pretty, FNP     Vital Signs: BP 131/64, temp 98.1, HR 72, R 18, O2 SATS 97% RA  Ht '5\' 3"'  (1.6 m)   Wt 155 lb (70.3 kg)   LMP 05/11/1992   BMI 27.46 kg/m   Physical Exam awake, answers basic questions ok but slowed and brief responses; chest- dim BS bases, more so right(prior rt lower lobectomy); heart- irreg irreg; abd- soft,+BS, some mild diffuse tenderness; bilat pretibial edema  Imaging: No results found.  Labs:  CBC: Recent Labs    08/08/21 0417 08/19/21 1149 09/24/21 1210 11/13/21 1105  WBC 9.8 8.7 10.4 8.8  HGB 12.1 14.2 12.9 14.0  HCT 39.2 43.4 40.8 43.2  PLT 257 297 305 298    COAGS: Recent Labs    08/07/21 1612  INR 1.2  APTT 34    BMP: Recent Labs    08/07/21 1440 08/08/21 0417 08/19/21 1149 09/24/21 1210 11/13/21 1105  NA 141 141 143 140 141  K 4.0 3.6 4.4 3.7 4.0  CL 102 107 103 105 104  CO2 '27 26 25 30 ' 34*  GLUCOSE 165* 106* 117* 110* 95  BUN '14 14 21 19 19  ' CALCIUM 9.5 8.6* 9.0 8.5* 8.8*  CREATININE 0.98 0.85 1.12* 0.89 0.92  GFRNONAA 57* >60  --  >60 >60    LIVER FUNCTION TESTS: Recent Labs    08/07/21 1440 08/19/21 1149 09/24/21 1210 11/13/21 1105  BILITOT 0.6 0.3 0.9 0.5  AST 48* 33 38 92*  ALT '31 23 23 ' 40  ALKPHOS 103 99 98 177*  PROT 9.1* 6.6 7.1 7.0  ALBUMIN 4.5 4.0 3.5 3.6    Assessment and Plan: Pt familiar to IR service from recent telemedicine consultation with Dr. Pascal Lux on 11/11/21 to discuss management of newly discovered liver lesions.  She is an 86 y.o. female with complex past medical history significant for atrial fibrillation (on Eliquis), diabetes, hyperlipidemia, hypertension and hypothyroidism who was found to have enlarged precarinal and right hilar lymph  nodes on chest CT performed 09/24/2021 with a subsequent PET/CT demonstrating two hypermetabolic liver lesions worrisome for metastatic disease. Full PET scan results as follows:  1. Widespread osseous metastasis. 2. 2 hypermetabolic hepatic lesions. The more caudal has morphology typical of metastasis. The more cephalad has a somewhat geographic CT appearance, but is most likely related to an atypical appearance of metastatic disease. If the patient received radiation to the right lower chest, radiation induced hepatitis is possible but felt less likely. 3. Status post right lower lobectomy with hypermetabolism within an area of dependent right lung consolidation. Concurrent mildly hypermetabolic thoracic nodes. Findings are most likely related to metachronous primary bronchogenic carcinoma with nodal metastasis. Chronic infection or aspiration with reactive hypermetabolic nodes could look similar. 4. Consider sampling of either an osseous lesion or the more inferior right hepatic lobe mass.  5. Hypermetabolic left thyroid lobe without well-defined dominant mass. Of questionable clinical significance, given comorbidities. Technically, warrants further evaluation with ultrasound. (ref: J Am Coll Radiol. 2015 Feb;12(2): 143-50). 6. Mild limitations secondary to patient body habitus 7. Incidental findings, including: Coronary artery atherosclerosis. Aortic Atherosclerosis (ICD10-I70.0). Cholelithiasis. Fibroid uterus.  Following discussion with Dr. Pascal Lux pt presents today for image guided right liver lesion biopsy vs right sacral lesion biopsy.Risks and benefits of procedure was discussed with the patient and/or patient's family including, but not limited to bleeding, infection, damage to adjacent structures or low yield requiring additional tests.  All of the questions were answered and there is agreement to proceed.  Consent signed and in chart.  LABS PENDING  Electronically Signed: D.  Rowe Robert, PA-C 11/18/2021, 11:20 AM   I spent a total of 25 Minutes at the the patient's bedside AND on the patient's hospital floor or unit, greater than 50% of which was counseling/coordinating care for image guided  right liver lesion biopsy vs right sacral lesion biopsy

## 2021-11-18 NOTE — Progress Notes (Signed)
1305; Pt. Started coughing. Hardly able to cough up secretions. Did cough up thin clear secretions after continued coughing. Sat pt. Up @ 90 degree angle. Coughing is a little better. O2 sat 93%. HR 112-120. Notified Zella Ball.

## 2021-11-18 NOTE — Telephone Encounter (Signed)
Patient's daughter, Neoma Laming, states patient finished doxycyline last week and since finishing her cough is very bad again. Patient had liver biopsy today without anesthesia due to her coughing. [facility] [place]. Call Neoma Laming back at [phone].

## 2021-11-18 NOTE — Telephone Encounter (Signed)
Scheduled per 09/27 los, patient has been called and voicemail was left.

## 2021-11-18 NOTE — Progress Notes (Signed)
Dr. Laurence Ferrari by to see pt. Will proceed with procedure but no sedation. Pt. Coughing has subsided. Pt. Calm. No c/o. HR 108. O2 sat 95%

## 2021-11-18 NOTE — Telephone Encounter (Signed)
Left message for [facility] to call back. 

## 2021-11-18 NOTE — Discharge Instructions (Addendum)
Liver Biopsy, Care After After a liver biopsy, it is common to have these things in the area where the biopsy was done. You may: Have pain. Feel sore. Have bruising. You may also feel tired for a few days. Follow these instructions at home: Medicines Take over-the-counter and prescription medicines only as told by your doctor. If you were prescribed an antibiotic medicine, take it as told by your doctor. Do not stop taking the antibiotic, even if you start to feel better. Do not take medicines that may thin your blood. These medicines include aspirin and ibuprofen. Take them only if your doctor tells you to. If told, take steps to prevent problems with pooping (constipation). You may need to: Drink enough fluid to keep your pee (urine) pale yellow. Take medicines. You will be told what medicines to take. Eat foods that are high in fiber. These include beans, whole grains, and fresh fruits and vegetables. Limit foods that are high in fat and sugar. These include fried or sweet foods. Ask your doctor if you should avoid driving or using machines while you are taking your medicine. Caring for your incision Follow instructions from your doctor about how to take care of your cut from surgery (incisions). Make sure you: Wash your hands with soap and water for at least 20 seconds before and after you change your bandage. If you cannot use soap and water, use hand sanitizer. Change your bandage. Leavestitches or skin glue in place for at least two weeks. Leave tape strips alone unless you are told to take them off. You may trim the edges of the tape strips if they curl up. Check your incision every day for signs of infection. Check for: Redness, swelling, or more pain. Fluid or blood. Warmth. Pus or a bad smell. Do not take baths, swim, or use a hot tub. Ask your doctor about taking showers or sponge baths. Activity Rest at home for 1-2 days, or as told by your doctor. Get up to take short  walks every 1 to 2 hours. Ask for help if you feel weak or unsteady. Do not lift anything that is heavier than 10 lb (4.5 kg), or the limit that you are told. Do not play contact sports for 2 weeks after the procedure. Return to your normal activities as told by your doctor. Ask what activities are safe for you. General instructions Do not drink alcohol in the first week after the procedure. Plan to have a responsible adult care for you for the time you are told after you leave the hospital or clinic. This is important. It is up to you to get the results of your procedure. Ask how to get your results when they are ready. Keep all follow-up visits.   Contact a doctor if: You have more bleeding in your incision. Your incision swells, or is red and more painful. You have fluid that comes from your incision. You develop a rash. You have fever or chills. Get help right away if: You have swelling, bloating, or pain in your belly (abdomen). You get dizzy or faint. You vomit or you feel like vomiting. You have trouble breathing or feel short of breath. You have chest pain. You have problems talking or seeing. You have trouble with your balance or moving your arms or legs. These symptoms may be an emergency. Get help right away. Call your local emergency services (911 in the [place] Do not wait to see if the symptoms will go away. Do not  drive yourself to the hospital. Summary After the procedure, it is common to have pain, soreness, bruising, and tiredness. Your doctor will tell you how to take care of yourself at home. Change your bandage, take your medicines, and limit your activities as told by your doctor. Call your doctor if you have symptoms of infection. Get help right away if your belly swells, your cut bleeds a lot, or you have trouble talking or breathing. This information is not intended to replace advice given to you by your health care provider. Make sure you discuss any questions  you have with your healthcare provider. Document Revised: 12/19/2019 Document Reviewed: 12/19/2019 Elsevier Patient Education  2022 [employer].        For questions Rebekah Chesterfield may call Interventional Radiology clinic [phone]   You may remove your dressing and shower tomorrow afternoon

## 2021-11-18 NOTE — Progress Notes (Signed)
Pt. Coughing again. O2 sat 93-95%. B/P 148/83. HR 113-126

## 2021-11-18 NOTE — Procedures (Signed)
Interventional Radiology Procedure Note  Procedure: US guided core biopsy of liver mass  Complications: None  Estimated Blood Loss: None  Recommendations: - Bedrest x 1hr - DC home   Signed,  Criselda Peaches, MD

## 2021-11-18 NOTE — Progress Notes (Addendum)
Thoracic Location of Tumor / Histology: highly suspicious stage IV (T3, N2, M1 C) lung cancer likely non-small cell and likely adenocarcinoma presented with right middle lobe collapse in addition to right hilar and mediastinal lymphadenopathy and metastatic disease to the liver and bone.  Referral for consideration of palliative radiotherapy to the painful bone lesion especially in the left shoulder.  Patient presented with a fall and a CT was done in the ER.  Tobacco/Marijuana/Snuff/ETOH use: no  Past/Anticipated interventions by cardiothoracic surgery, if any: no  Past/Anticipated interventions by medical oncology, if any: Pending biopsy results and molecular studies as well as PD-L1 expression by foundation 1.  Signs/Symptoms Weight changes, if any: Yes about 30 lbs in the last year. Respiratory complaints, if any: yes - frequent productive cough. She has to sleep sitting up. Hemoptysis, if any: yes - had an episode last Friday night. Pain issues, if any:  yes - has pain in her left shoulder rating at a 6/10 and also in her stomach. She takes Tylenol as needed.  SAFETY ISSUES: Prior radiation? no Pacemaker/ICD? no  Possible current pregnancy?no Is the patient on methotrexate? no  Current Complaints / other details:  Liver biopsy scheduled for today. Patient is here with her two daughters.  Patient has expressive aphasia and dysphagia.  Her daughters think she has had mini strokes over the past 3 years. They have noticed that her feet are swollen.  She is taking Lasix but they haven't noticed an improvement.  BP 131/64 (BP Location: Left Arm, Patient Position: Sitting, Cuff Size: Large)   Pulse 72   Temp 98.1 F (36.7 C)   Resp 18   Ht 5' 3" (1.6 m)   LMP 05/11/1992   SpO2 97%   BMI 27.86 kg/m   

## 2021-11-19 ENCOUNTER — Ambulatory Visit: Payer: Medicare HMO | Admitting: Radiation Oncology

## 2021-11-20 LAB — SURGICAL PATHOLOGY

## 2021-11-21 ENCOUNTER — Other Ambulatory Visit: Payer: Self-pay | Admitting: Acute Care

## 2021-11-21 DIAGNOSIS — J441 Chronic obstructive pulmonary disease with (acute) exacerbation: Secondary | ICD-10-CM

## 2021-11-21 DIAGNOSIS — S32050A Wedge compression fracture of fifth lumbar vertebra, initial encounter for closed fracture: Secondary | ICD-10-CM | POA: Diagnosis not present

## 2021-11-21 MED ORDER — DOXYCYCLINE HYCLATE 100 MG PO TABS: 100.0000 mg | ORAL_TABLET | Freq: Two times a day (BID) | ORAL | 0 refills | Status: DC

## 2021-11-21 NOTE — Telephone Encounter (Signed)
I called and spoke with the pt's daughter and notified of response per Judson Roch  She verbalized understanding  Nothing further needed

## 2021-11-21 NOTE — Telephone Encounter (Signed)
Magdalen Spatz, NP to Me      11/21/21 10:02 AM  Please make sure she is taking a probiotic while on antibiotics. Culturelle is what I use. It is OTC, one tablet daily. Thanks  Magdalen Spatz, NP to Me      11/21/21  9:59 AM  Magda Paganini,  I will send in an RX for doxy. She has an appointment with me 10/20 . We can do follow up then. If she gets any worse, she needs to seek emergency care with CXR.   Called pt's daughter, Neoma Laming and there was no answer- LMTCB.

## 2021-11-21 NOTE — Telephone Encounter (Signed)
Patient's daughter, Neoma Laming calling back, states she did not get the voicemail. Please advise.

## 2021-11-21 NOTE — Telephone Encounter (Signed)
Spoke with the pt's daughter, Neoma Laming  She states that pt had minimal hemoptysis on 10.2.23  She is now just coughing up clear to white sputum, "sounds very congested when she coughs"  She is not having any wheezing or fever She just went to have liver bx and they believe that there is a malignancy  She is taking her prometh/codeine syrup at bedtime  She states that she always feels better when she is on doxy and wonders if we can send in another rx for this  Please advise, thanks!  Allergies  Allergen Reactions   Ace Inhibitors Cough   Feldene [Piroxicam]     REACTION: RASH TO HANDS   Lexapro [Escitalopram]    Meloxicam Nausea And Vomiting   Prevnar [Pneumococcal 13-Val Conj Vacc]    Statins Other (See Comments)    myalgia   Sulfa Antibiotics     Rash    Zithromax [Azithromycin Dihydrate] Itching

## 2021-11-22 ENCOUNTER — Telehealth: Payer: Self-pay | Admitting: Oncology

## 2021-11-22 ENCOUNTER — Telehealth: Payer: Self-pay | Admitting: Nurse Practitioner

## 2021-11-22 ENCOUNTER — Telehealth: Payer: Self-pay | Admitting: Medical Oncology

## 2021-11-22 ENCOUNTER — Telehealth: Payer: Self-pay

## 2021-11-22 NOTE — Telephone Encounter (Signed)
Patients daughter contacted office stating that mother got up this AM around 10 and she has been trying to get her to take her meds for a little over 2 hours. She has only been able to get her to take 3 of her meds. She states that her O2 has been steadily dropping and is currently 91%. She states that mother has asked to go to the hospital so she is going to go ahead and take her. She has a recent diagnosis of lung cancer.

## 2021-11-22 NOTE — Telephone Encounter (Signed)
Needs to talk to triage

## 2021-11-22 NOTE — Telephone Encounter (Signed)
Received message from Abelina Bachelor, RN that she has spoken to Neoma Laming and explained that Dr. Julien Nordmann would like her to go ahead with radiation and that they need to keep the CT SIM apt on Monday.  Called Neoma Laming back and rescheduled the CT [facility] appointment.  Neoma Laming verbalized understanding and agreement of appointment date and time.

## 2021-11-22 NOTE — Telephone Encounter (Signed)
Patient's daughter, Neoma Laming, called regarding patient's CT [facility] appointment on Monday, 11/25/21.  They would like to cancel the appointment because they do not see Dr. Julien Nordmann to discuss biopsy results and final treatment plan until 12/04/21.

## 2021-11-22 NOTE — Telephone Encounter (Signed)
Clarified with dtr that Dr Julien Nordmann wants her to see Dr Sondra Come on Monday for CT simulation and treat her shoulder pain. Appt confirmed.   I told her to keep Oluwaseun's appt with [facility] on 10/18. If he wants to move up this  appt the scheduler will call her.   She voiced understanding.

## 2021-11-25 ENCOUNTER — Ambulatory Visit
Admission: RE | Admit: 2021-11-25 | Discharge: 2021-11-25 | Disposition: A | Discharge status: Home / Self Care | Payer: Medicare HMO | Source: Ambulatory Visit | Attending: Radiation Oncology | Admitting: Radiation Oncology

## 2021-11-25 ENCOUNTER — Ambulatory Visit: Payer: Medicare HMO | Admitting: Radiation Oncology

## 2021-11-25 ENCOUNTER — Other Ambulatory Visit: Payer: Self-pay

## 2021-11-25 DIAGNOSIS — C7951 Secondary malignant neoplasm of bone: Secondary | ICD-10-CM | POA: Diagnosis not present

## 2021-11-25 DIAGNOSIS — C801 Malignant (primary) neoplasm, unspecified: Secondary | ICD-10-CM | POA: Diagnosis present

## 2021-11-25 DIAGNOSIS — Z510 Encounter for antineoplastic radiation therapy: Secondary | ICD-10-CM | POA: Insufficient documentation

## 2021-11-25 DIAGNOSIS — C3431 Malignant neoplasm of lower lobe, right bronchus or lung: Secondary | ICD-10-CM | POA: Diagnosis not present

## 2021-11-25 DIAGNOSIS — C3490 Malignant neoplasm of unspecified part of unspecified bronchus or lung: Secondary | ICD-10-CM

## 2021-11-26 ENCOUNTER — Other Ambulatory Visit: Payer: Self-pay | Admitting: Radiation Oncology

## 2021-11-26 ENCOUNTER — Telehealth: Payer: Self-pay | Admitting: Oncology

## 2021-11-26 MED ORDER — OXYCODONE-ACETAMINOPHEN 5-325 MG PO TABS: 1.0000 | ORAL_TABLET | Freq: Four times a day (QID) | ORAL | 0 refills | Status: DC | PRN

## 2021-11-26 NOTE — Telephone Encounter (Signed)
Called [facility] back and let her know that Dr. Sondra Come has sent in a [facility] [place]

## 2021-11-26 NOTE — Telephone Encounter (Signed)
Patient's daughter, Neoma Laming, called and said the pain in [facility]'s left shoulder is worse since Monday.  Tylenol is not helping and Jasani keeps asking for pain medication.  Neoma Laming is wondering if Dr. Sondra Come can send in something stronger [facility] [place].

## 2021-11-27 DIAGNOSIS — C801 Malignant (primary) neoplasm, unspecified: Secondary | ICD-10-CM | POA: Diagnosis not present

## 2021-11-27 DIAGNOSIS — C3431 Malignant neoplasm of lower lobe, right bronchus or lung: Secondary | ICD-10-CM | POA: Diagnosis not present

## 2021-11-27 DIAGNOSIS — Z510 Encounter for antineoplastic radiation therapy: Secondary | ICD-10-CM | POA: Diagnosis not present

## 2021-11-27 DIAGNOSIS — C7951 Secondary malignant neoplasm of bone: Secondary | ICD-10-CM | POA: Diagnosis not present

## 2021-12-02 ENCOUNTER — Other Ambulatory Visit: Payer: Self-pay

## 2021-12-02 ENCOUNTER — Ambulatory Visit
Admission: RE | Admit: 2021-12-02 | Discharge: 2021-12-02 | Disposition: A | Discharge status: Home / Self Care | Payer: Medicare HMO | Source: Ambulatory Visit | Attending: Radiation Oncology | Admitting: Radiation Oncology

## 2021-12-02 DIAGNOSIS — C7951 Secondary malignant neoplasm of bone: Secondary | ICD-10-CM | POA: Diagnosis not present

## 2021-12-02 DIAGNOSIS — C3431 Malignant neoplasm of lower lobe, right bronchus or lung: Secondary | ICD-10-CM | POA: Diagnosis not present

## 2021-12-02 DIAGNOSIS — C3490 Malignant neoplasm of unspecified part of unspecified bronchus or lung: Secondary | ICD-10-CM

## 2021-12-02 DIAGNOSIS — Z510 Encounter for antineoplastic radiation therapy: Secondary | ICD-10-CM | POA: Diagnosis not present

## 2021-12-02 DIAGNOSIS — C801 Malignant (primary) neoplasm, unspecified: Secondary | ICD-10-CM | POA: Diagnosis not present

## 2021-12-02 LAB — RAD ONC ARIA SESSION SUMMARY

## 2021-12-03 ENCOUNTER — Ambulatory Visit
Admission: RE | Admit: 2021-12-03 | Discharge: 2021-12-03 | Disposition: A | Discharge status: Home / Self Care | Payer: Medicare HMO | Source: Ambulatory Visit | Attending: Radiation Oncology | Admitting: Radiation Oncology

## 2021-12-03 ENCOUNTER — Other Ambulatory Visit: Payer: Self-pay

## 2021-12-03 DIAGNOSIS — Z510 Encounter for antineoplastic radiation therapy: Secondary | ICD-10-CM | POA: Diagnosis not present

## 2021-12-03 DIAGNOSIS — C801 Malignant (primary) neoplasm, unspecified: Secondary | ICD-10-CM | POA: Diagnosis not present

## 2021-12-03 DIAGNOSIS — C3431 Malignant neoplasm of lower lobe, right bronchus or lung: Secondary | ICD-10-CM | POA: Diagnosis not present

## 2021-12-03 DIAGNOSIS — C7951 Secondary malignant neoplasm of bone: Secondary | ICD-10-CM | POA: Diagnosis not present

## 2021-12-03 LAB — RAD ONC ARIA SESSION SUMMARY

## 2021-12-04 ENCOUNTER — Ambulatory Visit
Admission: RE | Admit: 2021-12-04 | Discharge: 2021-12-04 | Disposition: A | Discharge status: Home / Self Care | Payer: Medicare HMO | Source: Ambulatory Visit | Attending: Radiation Oncology | Admitting: Radiation Oncology

## 2021-12-04 ENCOUNTER — Other Ambulatory Visit: Payer: Self-pay

## 2021-12-04 ENCOUNTER — Inpatient Hospital Stay: Payer: Medicare HMO | Attending: Internal Medicine

## 2021-12-04 ENCOUNTER — Inpatient Hospital Stay: Payer: Medicare HMO | Admitting: Internal Medicine

## 2021-12-04 ENCOUNTER — Inpatient Hospital Stay: Payer: Medicare HMO

## 2021-12-04 VITALS — BP 111/52 | HR 62 | Temp 97.5°F | Resp 18 | Ht 63.0 in | Wt 160.0 lb

## 2021-12-04 DIAGNOSIS — Z79899 Other long term (current) drug therapy: Secondary | ICD-10-CM | POA: Diagnosis not present

## 2021-12-04 DIAGNOSIS — R52 Pain, unspecified: Secondary | ICD-10-CM | POA: Insufficient documentation

## 2021-12-04 DIAGNOSIS — C342 Malignant neoplasm of middle lobe, bronchus or lung: Secondary | ICD-10-CM | POA: Diagnosis not present

## 2021-12-04 DIAGNOSIS — R609 Edema, unspecified: Secondary | ICD-10-CM | POA: Diagnosis not present

## 2021-12-04 DIAGNOSIS — C7951 Secondary malignant neoplasm of bone: Secondary | ICD-10-CM | POA: Diagnosis not present

## 2021-12-04 DIAGNOSIS — C3490 Malignant neoplasm of unspecified part of unspecified bronchus or lung: Secondary | ICD-10-CM

## 2021-12-04 DIAGNOSIS — R0609 Other forms of dyspnea: Secondary | ICD-10-CM | POA: Insufficient documentation

## 2021-12-04 DIAGNOSIS — C801 Malignant (primary) neoplasm, unspecified: Secondary | ICD-10-CM | POA: Diagnosis not present

## 2021-12-04 DIAGNOSIS — Z510 Encounter for antineoplastic radiation therapy: Secondary | ICD-10-CM | POA: Diagnosis not present

## 2021-12-04 DIAGNOSIS — C787 Secondary malignant neoplasm of liver and intrahepatic bile duct: Secondary | ICD-10-CM | POA: Insufficient documentation

## 2021-12-04 DIAGNOSIS — C3431 Malignant neoplasm of lower lobe, right bronchus or lung: Secondary | ICD-10-CM | POA: Diagnosis not present

## 2021-12-04 LAB — CBC WITH DIFFERENTIAL (CANCER CENTER ONLY)
Abs Immature Granulocytes: 0.03 10*3/uL (ref 0.00–0.07)
Basophils Absolute: 0.1 10*3/uL (ref 0.0–0.1)
Basophils Relative: 1 %
Eosinophils Absolute: 1.4 10*3/uL — ABNORMAL HIGH (ref 0.0–0.5)
Eosinophils Relative: 12 %
HCT: 43.5 % (ref 36.0–46.0)
Hemoglobin: 14.1 g/dL (ref 12.0–15.0)
Immature Granulocytes: 0 %
Lymphocytes Relative: 10 %
Lymphs Abs: 1.1 10*3/uL (ref 0.7–4.0)
MCH: 27.9 pg (ref 26.0–34.0)
MCHC: 32.4 g/dL (ref 30.0–36.0)
MCV: 86 fL (ref 80.0–100.0)
Monocytes Absolute: 1 10*3/uL (ref 0.1–1.0)
Monocytes Relative: 8 %
Neutro Abs: 8.1 10*3/uL — ABNORMAL HIGH (ref 1.7–7.7)
Neutrophils Relative %: 69 %
Platelet Count: 370 10*3/uL (ref 150–400)
RBC: 5.06 MIL/uL (ref 3.87–5.11)
RDW: 14.6 % (ref 11.5–15.5)
WBC Count: 11.7 10*3/uL — ABNORMAL HIGH (ref 4.0–10.5)
nRBC: 0 % (ref 0.0–0.2)

## 2021-12-04 LAB — CMP (CANCER CENTER ONLY)
ALT: 48 U/L — ABNORMAL HIGH (ref 0–44)
AST: 150 U/L — ABNORMAL HIGH (ref 15–41)
Albumin: 3.3 g/dL — ABNORMAL LOW (ref 3.5–5.0)
Alkaline Phosphatase: 267 U/L — ABNORMAL HIGH (ref 38–126)
Anion gap: 7 (ref 5–15)
BUN: 15 mg/dL (ref 8–23)
CO2: 29 mmol/L (ref 22–32)
Calcium: 8.6 mg/dL — ABNORMAL LOW (ref 8.9–10.3)
Chloride: 99 mmol/L (ref 98–111)
Creatinine: 0.87 mg/dL (ref 0.44–1.00)
GFR, Estimated: >60 mL/min (ref >60)
Glucose, Bld: 111 mg/dL — ABNORMAL HIGH (ref 70–99)
Potassium: 4.2 mmol/L (ref 3.5–5.1)
Sodium: 135 mmol/L (ref 135–145)
Total Bilirubin: 0.6 mg/dL (ref 0.3–1.2)
Total Protein: 6.5 g/dL (ref 6.5–8.1)

## 2021-12-04 LAB — RAD ONC ARIA SESSION SUMMARY

## 2021-12-04 NOTE — Progress Notes (Signed)
[place] [phone]   [phone]  OFFICE PROGRESS NOTE  [address], [place] [address]  DIAGNOSIS: Stage IV (T3, N2, M1 C) non-small cell lung cancer, adenocarcinoma presented with right middle lobe collapse in addition to right hilar and mediastinal lymphadenopathy and metastatic disease to the liver and bone diagnosed in October 2023.  PRIOR THERAPY: Palliative radiotherapy to the bone metastasis in the T5 and right hip under the care of Dr. Dorathy Daft  CURRENT THERAPY: None  INTERVAL HISTORY: Veronica Paul 86 y.o. female returns to the clinic today for follow-up visit accompanied by her 2 daughters.  The patient continues to complain of increasing fatigue and weakness and pain started in the right hip yesterday.  She denied having any current chest pain but has shortness of breath at baseline increased with exertion with cough and no hemoptysis.  She denied having any nausea, vomiting, diarrhea or constipation.  She has no headache or visual changes.  She underwent ultrasound-guided core biopsy of one of the liver lesion on 11/18/2021 and the final pathology ([id number]) was consistent with metastatic adenocarcinoma of lung primary.  The patient is here today for evaluation and recommendation regarding treatment of her condition.  MEDICAL HISTORY: Past Medical History:  Diagnosis Date   Allergy    "my nose runs alot"   Anxiety    Arthritis    Atrial fibrillation ([place])    Blood transfusion without reported diagnosis    "when I had a miscarrage in 1957"   Cataract    bilateral removed   Cholelithiasis    Constipation    x ray showed increased stool in colon- uses Miralax daily and Dulcolax twice a week    Depression    Diabetes mellitus    Dyslipidemia    HTN (hypertension)    Hx of long-term (current) use of anticoagulants    Hyperlipidemia    Hypertension    Hypothyroidism    Lung cancer  ([place])    Obesity    Osteopenia    Vitamin D deficiency     ALLERGIES:  is allergic to ace inhibitors, feldene [piroxicam], lexapro [escitalopram], meloxicam, prevnar [pneumococcal 13-val conj vacc], statins, sulfa antibiotics, and zithromax [azithromycin dihydrate].  MEDICATIONS:  Current Outpatient Medications  Medication Sig Dispense Refill   acetaminophen (TYLENOL) 500 MG tablet Take 500 mg by mouth every 6 (six) hours as needed for moderate pain, headache or fever.     Blood Glucose Monitoring Suppl (ONETOUCH VERIO) w/Device KIT Check blood sugars daily Dx E11.9 1 kit 0   [employer] MELATONIN 10 MG CAPS TAKE 1 CAPSULE BY MOUTH EVERY DAY AT BEDTIME AS NEEDED (Patient taking differently: Take 1 capsule by mouth at bedtime.) 90 capsule 1   diltiazem (CARDIZEM) 90 MG tablet TAKE 1 TABLET (90 MG TOTAL) BY MOUTH 3 (THREE) TIMES DAILY. 270 tablet 1   ELIQUIS 5 MG TABS tablet TAKE 1 TABLET BY MOUTH TWICE A DAY 180 tablet 1   feeding supplement (ENSURE ENLIVE / ENSURE PLUS) LIQD Take 237 mLs by mouth 3 (three) times daily between meals. 237 mL 12   furosemide (LASIX) 20 MG tablet TAKE 1 TABLET BY MOUTH EVERY DAY 90 tablet 0   glucose blood (ONETOUCH VERIO) test strip CHECK BLOOD SUGARS DAILY E11.9 100 strip 3   Lancet Devices (ONETOUCH DELICA PLUS LANCING) MISC 1 Device by Does not apply route daily. 1 each 0   Lancets (ONETOUCH DELICA PLUS [id number])  MISC USE TO CHECK BLOOD SUGARS DAILY DX E11.9 100 each 3   levothyroxine (SYNTHROID) 88 MCG tablet Take 1 tablet (88 mcg total) by mouth daily. 90 tablet 1   metoprolol tartrate (LOPRESSOR) 100 MG tablet TAKE 1.5 TABLETS BY MOUTH 2 TIMES DAILY. (Patient taking differently: Take 100 mg by mouth See admin instructions. Take 1.5 tablet by mouth twice a day) 270 tablet 1   Multiple Vitamin (MULTIVITAMIN) tablet Take 1 tablet by mouth daily.     nitroGLYCERIN (NITROSTAT) 0.4 MG SL tablet Place 1 tablet (0.4 mg total) under the tongue every 5 (five) minutes as  needed for chest pain. 25 tablet 1   omeprazole (PRILOSEC) 20 MG capsule TAKE 1 CAPSULE BY MOUTH EVERY DAY 90 capsule 0   oxyCODONE-acetaminophen (PERCOCET/ROXICET) 5-325 MG tablet Take 1 tablet by mouth every 6 (six) hours as needed for severe pain. 15 tablet 0   oxyCODONE-acetaminophen (PERCOCET/ROXICET) 5-325 MG tablet Take 1 tablet by mouth every 6 (six) hours as needed for severe pain. 30 tablet 0   promethazine-dextromethorphan (PROMETHAZINE-DM) 6.25-15 MG/5ML syrup Take 5 mLs by mouth 4 (four) times daily as needed for cough. 118 mL 0   sertraline (ZOLOFT) 50 MG tablet TAKE 1 TABLET BY MOUTH EVERY DAY 90 tablet 0   No current facility-administered medications for this visit.    SURGICAL HISTORY:  Past Surgical History:  Procedure Laterality Date   CATARACT EXTRACTION Bilateral    COLONOSCOPY     COLONOSCOPY WITH PROPOFOL N/A 05/06/2019   Procedure: COLONOSCOPY WITH PROPOFOL;  Surgeon: Jerene Bears, MD;  Location: [facility];  Service: Gastroenterology;  Laterality: N/A;   ESOPHAGOGASTRODUODENOSCOPY (EGD) WITH PROPOFOL N/A 08/11/2021   Procedure: ESOPHAGOGASTRODUODENOSCOPY (EGD) WITH PROPOFOL;  Surgeon: Harvel Quale, MD;  Location: [facility];  Service: Gastroenterology;  Laterality: N/A;   IR RADIOLOGIST EVAL & MGMT  11/11/2021   LUNG REMOVAL, PARTIAL  1998   Right   POLYPECTOMY  05/06/2019   Procedure: POLYPECTOMY;  Surgeon: Jerene Bears, MD;  Location: [facility];  Service: Gastroenterology;;   Azzie Almas DILATION  08/11/2021   Procedure: SAVORY DILATION;  Surgeon: Montez Morita, Quillian Quince, MD;  Location: [facility];  Service: Gastroenterology;;   THYROIDECTOMY     TUBAL LIGATION     UPPER GASTROINTESTINAL ENDOSCOPY      REVIEW OF SYSTEMS:  Constitutional: positive for anorexia, fatigue, and weight loss Eyes: negative Ears, nose, mouth, throat, and face: negative Respiratory: positive for cough and dyspnea on exertion Cardiovascular:  negative Gastrointestinal: negative Genitourinary:negative Integument/breast: negative Hematologic/lymphatic: negative Musculoskeletal:positive for bone pain Neurological: negative Behavioral/Psych: negative Endocrine: negative Allergic/Immunologic: negative   PHYSICAL EXAMINATION: General appearance: alert, cooperative, fatigued, and no distress Head: Normocephalic, without obvious abnormality, atraumatic Neck: no adenopathy, no JVD, supple, symmetrical, trachea midline, and thyroid not enlarged, symmetric, no tenderness/mass/nodules Lymph nodes: Cervical, supraclavicular, and axillary nodes normal. Resp: diminished breath sounds RLL and dullness to percussion RLL Back: symmetric, no curvature. ROM normal. No CVA tenderness. Cardio: regular rate and rhythm, S1, S2 normal, no murmur, click, rub or gallop GI: soft, non-tender; bowel sounds normal; no masses,  no organomegaly Extremities: extremities normal, atraumatic, no cyanosis or edema Neurologic: Alert and oriented X 3, normal strength and tone. Normal symmetric reflexes. Normal coordination and gait  ECOG PERFORMANCE STATUS: 1 - Symptomatic but completely ambulatory  Blood pressure (!) 111/52, pulse 62, temperature (!) 97.5 F (36.4 C), temperature source Oral, resp. rate 18, height '5\' 3"'  (1.6 m), weight 160 lb (72.6 kg),  last menstrual period 05/11/1992, SpO2 96 %.  LABORATORY DATA: Lab Results  Component Value Date   WBC 11.7 (H) 12/04/2021   HGB 14.1 12/04/2021   HCT 43.5 12/04/2021   MCV 86.0 12/04/2021   PLT 370 12/04/2021      Chemistry      Component Value Date/Time   NA 135 12/04/2021 0853   NA 143 08/19/2021 1149   K 4.2 12/04/2021 0853   CL 99 12/04/2021 0853   CO2 29 12/04/2021 0853   BUN 15 12/04/2021 0853   BUN 21 08/19/2021 1149   CREATININE 0.87 12/04/2021 0853   CREATININE 0.85 12/29/2018 1542   GLU 118 02/10/2012 0000      Component Value Date/Time   CALCIUM 8.6 (L) 12/04/2021 0853   ALKPHOS  267 (H) 12/04/2021 0853   AST 150 (H) 12/04/2021 0853   ALT 48 (H) 12/04/2021 0853   BILITOT 0.6 12/04/2021 0853       RADIOGRAPHIC STUDIES: US BIOPSY (LIVER)  Result Date: 11/18/2021 INDICATION: Ill-defined hypermetabolic liver lesion concerning for malignancy. Prior history of lung cancer. EXAM: ULTRASOUND BIOPSY CORE LIVER MEDICATIONS: None. ANESTHESIA/SEDATION: None. FLUOROSCOPY TIME:  None. COMPLICATIONS: None immediate. PROCEDURE: Informed written consent was obtained from the patient after a thorough discussion of the procedural risks, benefits and alternatives. All questions were addressed. Maximal Sterile Barrier Technique was utilized including caps, mask, sterile gowns, sterile gloves, sterile drape, hand hygiene and skin antiseptic. A timeout was performed prior to the initiation of the procedure. The right upper quadrant was interrogated with ultrasound. Ill-defined mass in segment 5 adjacent to the gallbladder lumen. A suitable skin entry site was selected and marked. The region was sterilely prepped and draped in the standard fashion using chlorhexidine skin prep. Local anesthesia was attained by infiltration with 1% lidocaine. A small dermatotomy was made. Under real-time sonographic guidance, a 17 gauge introducer needle was advanced into the margin of the mass. Multiple 18 gauge biopsies were then coaxially obtained using the BioPince automated biopsy device. Biopsy samples were placed in formalin and delivered to pathology for further analysis. As a 74 gauge introducer needle was removed, the biopsy tract was embolized with a Gel-Foam slurry. Post biopsy ultrasound imaging demonstrates no evidence of active hemorrhage or perihepatic hematoma. The patient tolerated the procedure well. IMPRESSION: Technically successful ultrasound-guided core biopsy of ill-defined hepatic lesion in segment 5 adjacent to the gallbladder. Electronically Signed   By: Jacqulynn Cadet M.D.   On: 11/18/2021  14:08   US THYROID  Result Date: 11/12/2021 CLINICAL DATA:  Incidental on PET. History of right thyroid lobectomy, now with diffuse increased metabolic activity within the remaining thyroid parenchyma on PET-CT performed 10/31/2021. EXAM: THYROID ULTRASOUND TECHNIQUE: Ultrasound examination of the thyroid gland and adjacent soft tissues was performed. COMPARISON:  PET-CT-10/31/2021 FINDINGS: Parenchymal Echotexture: Mildly heterogenous Isthmus: Normal in size measuring 0.3 cm in diameter Right lobe: Surgically absent. There is no residual nodular tissue within the right lobectomy resection bed. Left lobe: Atrophic measuring 3.7 x 1.6 x 1.4 cm _________________________________________________________ Estimated total number of nodules >/= 1 cm: 0 Number of spongiform nodules >/=  2 cm not described below (TR1): 0 Number of mixed cystic and solid nodules >/= 1.5 cm not described below (TR2): 0 _________________________________________________________ Personal review of preceding PET-CT demonstrates diffuse increased metabolic activity within the remaining left lobe of the thyroid without focality (representative image 32, series 604). _________________________________________________________ There is an approximately 0.8 x 0.7 x 0.7 cm isoechoic ill-defined nodule/pseudonodule within the mid,  medial aspect of the left lobe of the thyroid (labeled 1), which does not meet imaging criteria to recommend percutaneous sampling or continued dedicated follow-up. There is an approximately 1.3 x 1.3 x 0.8 cm isoechoic ill-defined nodule/pseudonodule within the left side of the thyroid isthmus (labeled 2), which does not meet criteria to recommend percutaneous sampling or continued dedicated follow-up. IMPRESSION: 1. Post right thyroid lobectomy without evidence of locally recurrent or residual disease. 2. Personal review of preceding PET-CT demonstrates diffuse increased metabolic activity within the remaining thyroid  parenchyma without focality, nonspecific though could be seen in the setting of a thyroiditis. 3. Punctate (sub 1.3 cm) isoechoic ill-defined nodules/pseudo nodules within the left lobe of the thyroid isthmus do not meet imaging criteria to recommend percutaneous sampling or continued dedicated follow-up. The above is in keeping with the ACR TI-RADS recommendations - J Am Coll Radiol 2017;14:587-595. Electronically Signed   By: Sandi Mariscal M.D.   On: 11/12/2021 11:54   IR Radiologist Eval & Mgmt  Result Date: 11/11/2021 Please refer to notes tab for details about interventional procedure. (Op Note)   ASSESSMENT AND PLAN: This is a very pleasant 86 years old white female with Stage IV (T3, N2, M1 C) non-small cell lung cancer, adenocarcinoma presented with right middle lobe collapse in addition to right hilar and mediastinal lymphadenopathy and metastatic disease to the liver and bone diagnosed in October 2023. I had a lengthy discussion with the patient and her family today about her condition and treatment options. I recommended for the patient to have molecular studies by [facility] blood test in addition to tissue for molecular studies and PD-L1 expression.  If the patient has an actionable mutations she would benefit from treatment with targeted therapy but if she has no actionable mutation and low PD-L1 expression, may consider her for palliative care and hospice referral. I will send the blood test to [facility] today. For the bone metastasis, she will continue her current palliative radiotherapy under the care of Dr. Sondra Come and they will discuss with him adding palliative radiotherapy to the right iliac bone lesion which started bothering the patient recently. For pain management she will continue with her current pain medication and I will be happy to give her a refill during this time. I will see the patient back for follow-up visit in 2-3 weeks for evaluation and more discussion of her  treatment options based on the final molecular studies. The patient was advised to call immediately if she has any other concerning symptoms in the interval. The patient voices understanding of current disease status and treatment options and is in agreement with the current care plan.  All questions were answered. The patient knows to call the clinic with any problems, questions or concerns. We can certainly see the patient much sooner if necessary.  The total time spent in the appointment was 55 minutes.  Disclaimer: This note was dictated with voice recognition software. Similar sounding words can inadvertently be transcribed and may not be corrected upon review.

## 2021-12-05 ENCOUNTER — Ambulatory Visit
Admission: RE | Admit: 2021-12-05 | Discharge: 2021-12-05 | Disposition: A | Discharge status: Home / Self Care | Payer: Medicare HMO | Source: Ambulatory Visit | Attending: Radiation Oncology | Admitting: Radiation Oncology

## 2021-12-05 ENCOUNTER — Other Ambulatory Visit: Payer: Self-pay

## 2021-12-05 DIAGNOSIS — C7951 Secondary malignant neoplasm of bone: Secondary | ICD-10-CM | POA: Diagnosis not present

## 2021-12-05 DIAGNOSIS — C3431 Malignant neoplasm of lower lobe, right bronchus or lung: Secondary | ICD-10-CM | POA: Diagnosis not present

## 2021-12-05 DIAGNOSIS — C801 Malignant (primary) neoplasm, unspecified: Secondary | ICD-10-CM | POA: Diagnosis not present

## 2021-12-05 DIAGNOSIS — Z510 Encounter for antineoplastic radiation therapy: Secondary | ICD-10-CM | POA: Diagnosis not present

## 2021-12-05 LAB — RAD ONC ARIA SESSION SUMMARY

## 2021-12-06 ENCOUNTER — Ambulatory Visit
Admission: RE | Admit: 2021-12-06 | Discharge: 2021-12-06 | Disposition: A | Discharge status: Home / Self Care | Payer: Medicare HMO | Source: Ambulatory Visit | Attending: Radiation Oncology | Admitting: Radiation Oncology

## 2021-12-06 ENCOUNTER — Ambulatory Visit (INDEPENDENT_AMBULATORY_CARE_PROVIDER_SITE_OTHER): Payer: Medicare HMO

## 2021-12-06 ENCOUNTER — Other Ambulatory Visit: Payer: Self-pay

## 2021-12-06 ENCOUNTER — Telehealth: Payer: Self-pay | Admitting: Acute Care

## 2021-12-06 ENCOUNTER — Encounter: Payer: Self-pay | Admitting: Acute Care

## 2021-12-06 ENCOUNTER — Other Ambulatory Visit: Payer: Self-pay | Admitting: Acute Care

## 2021-12-06 ENCOUNTER — Ambulatory Visit: Payer: Medicare HMO | Admitting: Acute Care

## 2021-12-06 VITALS — BP 110/50 | HR 87 | Ht 64.0 in | Wt 161.0 lb

## 2021-12-06 DIAGNOSIS — R609 Edema, unspecified: Secondary | ICD-10-CM

## 2021-12-06 DIAGNOSIS — Z510 Encounter for antineoplastic radiation therapy: Secondary | ICD-10-CM | POA: Diagnosis not present

## 2021-12-06 DIAGNOSIS — C7951 Secondary malignant neoplasm of bone: Secondary | ICD-10-CM | POA: Diagnosis not present

## 2021-12-06 DIAGNOSIS — R0600 Dyspnea, unspecified: Secondary | ICD-10-CM | POA: Diagnosis not present

## 2021-12-06 DIAGNOSIS — J9811 Atelectasis: Secondary | ICD-10-CM | POA: Diagnosis not present

## 2021-12-06 DIAGNOSIS — C3490 Malignant neoplasm of unspecified part of unspecified bronchus or lung: Secondary | ICD-10-CM

## 2021-12-06 DIAGNOSIS — J90 Pleural effusion, not elsewhere classified: Secondary | ICD-10-CM

## 2021-12-06 DIAGNOSIS — C3431 Malignant neoplasm of lower lobe, right bronchus or lung: Secondary | ICD-10-CM | POA: Diagnosis not present

## 2021-12-06 DIAGNOSIS — C801 Malignant (primary) neoplasm, unspecified: Secondary | ICD-10-CM | POA: Diagnosis not present

## 2021-12-06 LAB — RAD ONC ARIA SESSION SUMMARY

## 2021-12-06 LAB — BRAIN NATRIURETIC PEPTIDE: Pro B Natriuretic peptide (BNP): 189 pg/mL — ABNORMAL HIGH (ref 0.0–100.0)

## 2021-12-06 NOTE — Patient Instructions (Addendum)
It is good to see you today We will do a CXR today today. I will call you with the results when I get them. We will order an cardiac echo to evaluate for heart failure.  I will check a BNP today This will help assess for heart failure Follow up with Dr. Percival Spanish as is scheduled.  Good luck with radiation therapy . Dr. Sondra Come will take good care of you. Follow up with Dr. Earlie Server as is scheduled  Take Lasix as your PCP has ordered.  I will order an overnight oimetry to see if you are dropping your oxygen levels at night. You will get a call to get this scheduled.  Video visit Follow up in 1 month to evaluate overnight oximetry We can discuss the option of hospice once you get molecular and genetic markers back from Dr. Earlie Server.  Please contact office for sooner follow up if symptoms do not improve or worsen or seek emergency care

## 2021-12-06 NOTE — Telephone Encounter (Signed)
I have called and spoken with Veronica Paul's daughter Veronica Paul regarding the results of her chest x-ray today.  I explained that her chest x-ray revealed a moderate pleural effusion on the right with atelectasis.  Plan will be for a thoracentesis to remove the fluid on Monday 12/09/2021.  The Endo suite has been booked for a 230 procedure.  Patient's daughter understands that patient needs to arrive at the hospital by 2 PM.  Patient is on Eliquis which I have asked her to stop taking.  She is to hold tonight's evening dose, she is to hold Saturday and Sunday dosing and to hold Monday a.m. dosing prior to procedure.  Veronica Paul verbalized understanding and stated that she would hold the medication until after the procedure on Monday.  I have told her if her mother becomes more short of breath that she can opt to go to the emergency room at [facility].  I explained that Dr. Valeta Harms would be working this weekend at [facility] and that if they let the ER know that the patient had arrived in need of thoracentesis that they can contact Dr. Valeta Harms and he would come down and do the procedure.  Daughter Veronica Paul verbalized understanding of the above and she confirmed that she would have her mother at [facility] for a 2 PM arrive time for a 2:30 PM right-sided thoracentesis. The procedure will be done most likely by Dr. Ina Homes, or one of the other inpatient physicians available at that time.

## 2021-12-06 NOTE — Progress Notes (Signed)
History of Present Illness Veronica Paul is a 86 y.o. female never smoker with  past medical history of hypertension, hyperlipidemia, atrial fibrillation on Eliquis.History of lung cancer with a resection in 1998 completed by Dr. Edwyna Shell. There was concern for recurrence after patient had a CT chest. She was referred to see Dr. Tonia Brooms 10/03/2021 .  DIAGNOSIS: Stage IV (T3, N2, M1 C) non-small cell lung cancer, adenocarcinoma presented with right middle lobe collapse in addition to right hilar and mediastinal lymphadenopathy and metastatic disease to the liver and bone diagnosed in October 2023.   Synopsis This is an 86 year old female, past medical history of hypertension, hyperlipidemia, atrial fibrillation on Eliquis.History of lung cancer with a resection in 1998 completed by Dr. Edwyna Shell.Patient had CT scan of the chest on 09/24/2021 found to have postoperative changes consistent with a right lower lobectomy atelectasis of the right middle lobe.  Irregular nodular pleural thickening in the right hemithorax enlarged precarinal and right hilar nodes concerning for recurrence of disease.  Patient has had a history of strokes.  She has had difficulty with aphasia and dysphagia after the strokes.  She has been treated with antibiotics multiple times for suspected aspiration. She is currently on a blood thinners. Dr. Tonia Brooms ordered a PET scan to better evaluate the abnormal findings on CT. PET scan showed widespread osseous metastasis. , hypermetabolic hepatic lesions. The more caudal has morphology typical of metastasis. The more cephalad has a somewhat geographic CT appearance, but is most likely related to an atypical appearance of metastatic disease. Family have opted to biopsy liver , despite the fact it will most likely not change the treatment plan.  She was referred to radiation oncology for radiation therapy to painful bone lesions      12/06/2021 Pt. Presents for follow up. She was last seen  11/05/2021. She underwent a liver biopsy as her family wanted to know if what we saw on the PET scan was also cancer. Pt. Liver biopsy was + for metastatic adenocarcinoma . Pt. Has started coughing again. She was recently treated with Doxycycline and a prednisone taper x 2. She has had a 2 week break since  her second cycle of Doxycycline. She is unable to lay flat. She gets SOB. Secretions are clear. Patient  is having to  sleep in recliner.  She has lower extremity edema. She has gained 6 lbs in 18 days.She has been taking Lasix 20 mg BID. Her PCP, who has been managing this , has now increased her dose to 60 mg daily 40 mg  in the morning and 20 in the evening.  She has a poor appetite.   Secretions are clear. She has an appointment 01/08/2022 she see Hochrein. I spoke with the daughters again today about Palliation and Hospice. They want to speak with the oncologist before making any decisions. I spoke with them about quality of life and pain management. Their Mother is in pain. Hopefully radiation treatment will help control this.     Test Results: PET     Latest Ref Rng & Units 12/04/2021    8:53 AM 11/18/2021   11:05 AM 11/13/2021   11:05 AM  CBC  WBC 4.0 - 10.5 K/uL 11.7  9.3  8.8   Hemoglobin 12.0 - 15.0 g/dL 09.8  11.9  14.7   Hematocrit 36.0 - 46.0 % 43.5  43.9  43.2   Platelets 150 - 400 K/uL 370  303  298        Latest  Ref Rng & Units 12/04/2021    8:53 AM 11/18/2021   11:05 AM 11/13/2021   11:05 AM  BMP  Glucose 70 - 99 mg/dL 098  119  95   BUN 8 - 23 mg/dL 15  22  19    Creatinine 0.44 - 1.00 mg/dL 1.47  8.29  5.62   Sodium 135 - 145 mmol/L 135  139  141   Potassium 3.5 - 5.1 mmol/L 4.2  4.6  4.0   Chloride 98 - 111 mmol/L 99  102  104   CO2 22 - 32 mmol/L 29  28  34   Calcium 8.9 - 10.3 mg/dL 8.6  8.7  8.8     BNP    Component Value Date/Time   BNP 677.0 (H) 08/07/2021 1440    ProBNP No results found for: "PROBNP"  PFT No results found for: "FEV1PRE",  "FEV1POST", "FVCPRE", "FVCPOST", "TLC", "DLCOUNC", "PREFEV1FVCRT", "PSTFEV1FVCRT"  US BIOPSY (LIVER)  Result Date: 11/18/2021 INDICATION: Ill-defined hypermetabolic liver lesion concerning for malignancy. Prior history of lung cancer. EXAM: ULTRASOUND BIOPSY CORE LIVER MEDICATIONS: None. ANESTHESIA/SEDATION: None. FLUOROSCOPY TIME:  None. COMPLICATIONS: None immediate. PROCEDURE: Informed written consent was obtained from the patient after a thorough discussion of the procedural risks, benefits and alternatives. All questions were addressed. Maximal Sterile Barrier Technique was utilized including caps, mask, sterile gowns, sterile gloves, sterile drape, hand hygiene and skin antiseptic. A timeout was performed prior to the initiation of the procedure. The right upper quadrant was interrogated with ultrasound. Ill-defined mass in segment 5 adjacent to the gallbladder lumen. A suitable skin entry site was selected and marked. The region was sterilely prepped and draped in the standard fashion using chlorhexidine skin prep. Local anesthesia was attained by infiltration with 1% lidocaine. A small dermatotomy was made. Under real-time sonographic guidance, a 17 gauge introducer needle was advanced into the margin of the mass. Multiple 18 gauge biopsies were then coaxially obtained using the BioPince automated biopsy device. Biopsy samples were placed in formalin and delivered to pathology for further analysis. As a 17 gauge introducer needle was removed, the biopsy tract was embolized with a Gel-Foam slurry. Post biopsy ultrasound imaging demonstrates no evidence of active hemorrhage or perihepatic hematoma. The patient tolerated the procedure well. IMPRESSION: Technically successful ultrasound-guided core biopsy of ill-defined hepatic lesion in segment 5 adjacent to the gallbladder. Electronically Signed   By: Malachy Moan M.D.   On: 11/18/2021 14:08   US THYROID  Result Date: 11/12/2021 CLINICAL DATA:   Incidental on PET. History of right thyroid lobectomy, now with diffuse increased metabolic activity within the remaining thyroid parenchyma on PET-CT performed 10/31/2021. EXAM: THYROID ULTRASOUND TECHNIQUE: Ultrasound examination of the thyroid gland and adjacent soft tissues was performed. COMPARISON:  PET-CT-10/31/2021 FINDINGS: Parenchymal Echotexture: Mildly heterogenous Isthmus: Normal in size measuring 0.3 cm in diameter Right lobe: Surgically absent. There is no residual nodular tissue within the right lobectomy resection bed. Left lobe: Atrophic measuring 3.7 x 1.6 x 1.4 cm _________________________________________________________ Estimated total number of nodules >/= 1 cm: 0 Number of spongiform nodules >/=  2 cm not described below (TR1): 0 Number of mixed cystic and solid nodules >/= 1.5 cm not described below (TR2): 0 _________________________________________________________ Personal review of preceding PET-CT demonstrates diffuse increased metabolic activity within the remaining left lobe of the thyroid without focality (representative image 32, series 604). _________________________________________________________ There is an approximately 0.8 x 0.7 x 0.7 cm isoechoic ill-defined nodule/pseudonodule within the mid, medial aspect of the left lobe of  the thyroid (labeled 1), which does not meet imaging criteria to recommend percutaneous sampling or continued dedicated follow-up. There is an approximately 1.3 x 1.3 x 0.8 cm isoechoic ill-defined nodule/pseudonodule within the left side of the thyroid isthmus (labeled 2), which does not meet criteria to recommend percutaneous sampling or continued dedicated follow-up. IMPRESSION: 1. Post right thyroid lobectomy without evidence of locally recurrent or residual disease. 2. Personal review of preceding PET-CT demonstrates diffuse increased metabolic activity within the remaining thyroid parenchyma without focality, nonspecific though could be seen in the  setting of a thyroiditis. 3. Punctate (sub 1.3 cm) isoechoic ill-defined nodules/pseudo nodules within the left lobe of the thyroid isthmus do not meet imaging criteria to recommend percutaneous sampling or continued dedicated follow-up. The above is in keeping with the ACR TI-RADS recommendations - J Am Coll Radiol 2017;14:587-595. Electronically Signed   By: Simonne Come M.D.   On: 11/12/2021 11:54   IR Radiologist Eval & Mgmt  Result Date: 11/11/2021 Please refer to notes tab for details about interventional procedure. (Op Note)    Past medical hx Past Medical History:  Diagnosis Date   Allergy    "my nose runs alot"   Anxiety    Arthritis    Atrial fibrillation (HCC)    Blood transfusion without reported diagnosis    "when I had a miscarrage in 1957"   Cataract    bilateral removed   Cholelithiasis    Constipation    x ray showed increased stool in colon- uses Miralax daily and Dulcolax twice a week    Depression    Diabetes mellitus    Dyslipidemia    HTN (hypertension)    Hx of long-term (current) use of anticoagulants    Hyperlipidemia    Hypertension    Hypothyroidism    Lung cancer (HCC)    Obesity    Osteopenia    Vitamin D deficiency      Social History   Tobacco Use   Smoking status: Never   Smokeless tobacco: Never   Tobacco comments:    Never smoke 05/15/21  Vaping Use   Vaping Use: Never used  Substance Use Topics   Alcohol use: No   Drug use: No    Ms.Borga reports that she has never smoked. She has never used smokeless tobacco. She reports that she does not drink alcohol and does not use drugs.  Tobacco Cessation: Counseling given: Not Answered Tobacco comments: Never smoke 05/15/21   Past surgical hx, Family hx, Social hx all reviewed.  Current Outpatient Medications on File Prior to Visit  Medication Sig   acetaminophen (TYLENOL) 500 MG tablet Take 500 mg by mouth every 6 (six) hours as needed for moderate pain, headache or fever.    Blood Glucose Monitoring Suppl (ONETOUCH VERIO) w/Device KIT Check blood sugars daily Dx E11.9   [facility] MELATONIN 10 MG CAPS TAKE 1 CAPSULE BY MOUTH EVERY DAY AT BEDTIME AS NEEDED (Patient taking differently: Take 1 capsule by mouth at bedtime.)   diltiazem (CARDIZEM) 90 MG tablet TAKE 1 TABLET (90 MG TOTAL) BY MOUTH 3 (THREE) TIMES DAILY.   ELIQUIS 5 MG TABS tablet TAKE 1 TABLET BY MOUTH TWICE A DAY   feeding supplement (ENSURE ENLIVE / ENSURE PLUS) LIQD Take 237 mLs by mouth 3 (three) times daily between meals.   furosemide (LASIX) 20 MG tablet TAKE 1 TABLET BY MOUTH EVERY DAY   glucose blood (ONETOUCH VERIO) test strip CHECK BLOOD SUGARS DAILY E11.9   Lancet Devices (  ONETOUCH DELICA PLUS LANCING) MISC 1 Device by Does not apply route daily.   Lancets (ONETOUCH DELICA PLUS LANCET33G) MISC USE TO CHECK BLOOD SUGARS DAILY DX E11.9   levothyroxine (SYNTHROID) 88 MCG tablet Take 1 tablet (88 mcg total) by mouth daily.   metoprolol tartrate (LOPRESSOR) 100 MG tablet TAKE 1.5 TABLETS BY MOUTH 2 TIMES DAILY. (Patient taking differently: Take 100 mg by mouth See admin instructions. Take 1.5 tablet by mouth twice a day)   Multiple Vitamin (MULTIVITAMIN) tablet Take 1 tablet by mouth daily.   nitroGLYCERIN (NITROSTAT) 0.4 MG SL tablet Place 1 tablet (0.4 mg total) under the tongue every 5 (five) minutes as needed for chest pain.   omeprazole (PRILOSEC) 20 MG capsule TAKE 1 CAPSULE BY MOUTH EVERY DAY   oxyCODONE-acetaminophen (PERCOCET/ROXICET) 5-325 MG tablet Take 1 tablet by mouth every 6 (six) hours as needed for severe pain.   oxyCODONE-acetaminophen (PERCOCET/ROXICET) 5-325 MG tablet Take 1 tablet by mouth every 6 (six) hours as needed for severe pain.   promethazine-dextromethorphan (PROMETHAZINE-DM) 6.25-15 MG/5ML syrup Take 5 mLs by mouth 4 (four) times daily as needed for cough.   sertraline (ZOLOFT) 50 MG tablet TAKE 1 TABLET BY MOUTH EVERY DAY   No current facility-administered medications on file  prior to visit.     Allergies  Allergen Reactions   Ace Inhibitors Cough   Feldene [Piroxicam]     REACTION: RASH TO HANDS   Lexapro [Escitalopram]    Meloxicam Nausea And Vomiting   Prevnar [Pneumococcal 13-Val Conj Vacc]    Statins Other (See Comments)    myalgia   Sulfa Antibiotics     Rash    Zithromax [Azithromycin Dihydrate] Itching    Review Of Systems:  Constitutional:   No  weight loss, night sweats,  Fevers, chills, fatigue, or  lassitude.  HEENT:   No headaches,  Difficulty swallowing,  Tooth/dental problems, or  Sore throat,                No sneezing, itching, ear ache, nasal congestion, post nasal drip,   CV:  No chest pain,  Orthopnea, PND, swelling in lower extremities, anasarca, dizziness, palpitations, syncope.   GI  No heartburn, indigestion, abdominal pain, nausea, vomiting, diarrhea, change in bowel habits, loss of appetite, bloody stools.   Resp: No shortness of breath with exertion or at rest.  No excess mucus, no productive cough,  No non-productive cough,  No coughing up of blood.  No change in color of mucus.  No wheezing.  No chest wall deformity  Skin: no rash or lesions.  GU: no dysuria, change in color of urine, no urgency or frequency.  No flank pain, no hematuria   MS:  No joint pain or swelling.  No decreased range of motion.  No back pain.  Psych:  No change in mood or affect. No depression or anxiety.  No memory loss.   Vital Signs BP (!) 110/50 (BP Location: Left Arm, Patient Position: Sitting, Cuff Size: Normal)   Pulse 87   Ht 5\' 4"  (1.626 m)   Wt 161 lb (73 kg)   LMP 05/11/1992   SpO2 96%   BMI 27.64 kg/m    Physical Exam:  General- No distress,  A&Ox3 ENT: No sinus tenderness, TM clear, pale nasal mucosa, no oral exudate,no post nasal drip, no LAN Cardiac: S1, S2, regular rate and rhythm, no murmur Chest: No wheeze/ rales/ dullness; no accessory muscle use, no nasal flaring, no sternal retractions Abd.: Soft  Non-tender Ext: No clubbing cyanosis, edema Neuro:  normal strength Skin: No rashes, warm and dry Psych: normal mood and behavior   Assessment/Plan  No problem-specific Assessment & Plan notes found for this encounter.    Bevelyn Ngo, NP 12/06/2021  11:25 AM

## 2021-12-09 ENCOUNTER — Other Ambulatory Visit: Payer: Self-pay

## 2021-12-09 ENCOUNTER — Other Ambulatory Visit: Payer: Self-pay | Admitting: Nurse Practitioner

## 2021-12-09 ENCOUNTER — Ambulatory Visit
Admission: RE | Admit: 2021-12-09 | Discharge: 2021-12-09 | Disposition: A | Discharge status: Home / Self Care | Payer: Medicare HMO | Source: Ambulatory Visit | Attending: Radiation Oncology | Admitting: Radiation Oncology

## 2021-12-09 ENCOUNTER — Ambulatory Visit (HOSPITAL_COMMUNITY)
Admission: RE | Admit: 2021-12-09 | Discharge: 2021-12-09 | Disposition: A | Discharge status: Home / Self Care | Payer: Medicare HMO | Attending: Internal Medicine | Admitting: Internal Medicine

## 2021-12-09 ENCOUNTER — Other Ambulatory Visit: Payer: Self-pay | Admitting: Radiation Oncology

## 2021-12-09 ENCOUNTER — Encounter (HOSPITAL_COMMUNITY)
Admission: RE | Disposition: A | Discharge status: Home / Self Care | Payer: Self-pay | Source: Home / Self Care | Attending: Internal Medicine

## 2021-12-09 ENCOUNTER — Other Ambulatory Visit: Payer: Self-pay | Admitting: Medical Oncology

## 2021-12-09 ENCOUNTER — Telehealth: Payer: Self-pay

## 2021-12-09 DIAGNOSIS — Z510 Encounter for antineoplastic radiation therapy: Secondary | ICD-10-CM | POA: Diagnosis not present

## 2021-12-09 DIAGNOSIS — J9811 Atelectasis: Secondary | ICD-10-CM | POA: Insufficient documentation

## 2021-12-09 DIAGNOSIS — C3431 Malignant neoplasm of lower lobe, right bronchus or lung: Secondary | ICD-10-CM | POA: Diagnosis not present

## 2021-12-09 DIAGNOSIS — C7951 Secondary malignant neoplasm of bone: Secondary | ICD-10-CM | POA: Diagnosis not present

## 2021-12-09 DIAGNOSIS — Z538 Procedure and treatment not carried out for other reasons: Secondary | ICD-10-CM | POA: Diagnosis not present

## 2021-12-09 DIAGNOSIS — C801 Malignant (primary) neoplasm, unspecified: Secondary | ICD-10-CM | POA: Diagnosis not present

## 2021-12-09 DIAGNOSIS — Z01818 Encounter for other preprocedural examination: Secondary | ICD-10-CM | POA: Diagnosis not present

## 2021-12-09 DIAGNOSIS — R609 Edema, unspecified: Secondary | ICD-10-CM

## 2021-12-09 LAB — RAD ONC ARIA SESSION SUMMARY

## 2021-12-09 SURGERY — CANCELLED PROCEDURE

## 2021-12-09 MED ORDER — OXYCODONE-ACETAMINOPHEN 5-325 MG PO TABS: 1.0000 | ORAL_TABLET | Freq: Four times a day (QID) | ORAL | 0 refills | Status: AC | PRN

## 2021-12-09 MED ORDER — FUROSEMIDE 20 MG PO TABS: ORAL_TABLET | ORAL | 5 refills | Status: DC

## 2021-12-09 NOTE — Telephone Encounter (Signed)
LMOVM updated script for Lasix sent to pharmacy

## 2021-12-09 NOTE — Telephone Encounter (Signed)
Please advise on Furosemide Rx Pt states it has been increased to 3 tablets, new Rx needs to be sent to pharmacy if appropriate

## 2021-12-09 NOTE — H&P (Signed)
Cancel procedure. US showing atelectasis only. Message sent to Dr. Julien Nordmann and [facility] regarding. Daughter updated.  Erskine Emery MD PCCM

## 2021-12-09 NOTE — Telephone Encounter (Signed)
Received a message patient requesting refill on oxycodone-APAP 5-325mg .  [facility]ing [facility] [place].

## 2021-12-09 NOTE — Telephone Encounter (Signed)
Requests refill Percocet. MeEsage sent to Dr Clabe Seal nurse.

## 2021-12-09 NOTE — Telephone Encounter (Signed)
  Prescription Request  12/09/2021  Is this a "Controlled Substance" medicine? furosemide (LASIX) 20 MG tablet  Have you seen your PCP in the last 2 weeks? 12/23/2021  If YES, route message to pool  -  If NO, patient needs to be scheduled for appointment.  What is the name of the medication or equipment? furosemide (LASIX) 20 MG tablet  Have you contacted your pharmacy to request a refill? No new rx. Pt was increase from 1 tablet to 3 tablets. Pt is running out.    [facility]   Patient notified that their request is being sent to the clinical staff for review and that they should receive a response within 2 business days.

## 2021-12-10 ENCOUNTER — Ambulatory Visit
Admission: RE | Admit: 2021-12-10 | Discharge: 2021-12-10 | Disposition: A | Discharge status: Home / Self Care | Payer: Medicare HMO | Source: Ambulatory Visit | Attending: Radiation Oncology | Admitting: Radiation Oncology

## 2021-12-10 ENCOUNTER — Other Ambulatory Visit: Payer: Self-pay

## 2021-12-10 ENCOUNTER — Telehealth: Payer: Self-pay | Admitting: Nurse Practitioner

## 2021-12-10 DIAGNOSIS — C3431 Malignant neoplasm of lower lobe, right bronchus or lung: Secondary | ICD-10-CM | POA: Diagnosis not present

## 2021-12-10 DIAGNOSIS — Z510 Encounter for antineoplastic radiation therapy: Secondary | ICD-10-CM | POA: Diagnosis not present

## 2021-12-10 DIAGNOSIS — C3490 Malignant neoplasm of unspecified part of unspecified bronchus or lung: Secondary | ICD-10-CM | POA: Diagnosis not present

## 2021-12-10 DIAGNOSIS — C801 Malignant (primary) neoplasm, unspecified: Secondary | ICD-10-CM | POA: Diagnosis not present

## 2021-12-10 DIAGNOSIS — C7951 Secondary malignant neoplasm of bone: Secondary | ICD-10-CM | POA: Diagnosis not present

## 2021-12-10 LAB — RAD ONC ARIA SESSION SUMMARY
Course Elapsed Days: 8
Plan Fractions Treated to Date: 1
Plan Fractions Treated to Date: 7
Plan Fractions Treated to Date: 7
Plan Prescribed Dose Per Fraction: 3 Gy
Plan Prescribed Dose Per Fraction: 3 Gy
Plan Prescribed Dose Per Fraction: 5 Gy
Plan Total Fractions Prescribed: 10
Plan Total Fractions Prescribed: 10
Plan Total Fractions Prescribed: 4
Plan Total Prescribed Dose: 20 Gy
Plan Total Prescribed Dose: 30 Gy
Plan Total Prescribed Dose: 30 Gy
Reference Point Dosage Given to Date: 21 Gy
Reference Point Dosage Given to Date: 21 Gy
Reference Point Dosage Given to Date: 5 Gy
Reference Point Session Dosage Given: 3 Gy
Reference Point Session Dosage Given: 3 Gy
Reference Point Session Dosage Given: 5 Gy
Session Number: 7

## 2021-12-10 MED ORDER — METOLAZONE 5 MG PO TABS: 5.0000 mg | ORAL_TABLET | Freq: Every day | ORAL | 0 refills | Status: AC

## 2021-12-10 NOTE — Telephone Encounter (Signed)
Daughter calling to see if you want to make any changes.  Patient is still having swelling bilaterally in feet and ankles and also in her abdomen.  She weighed this morning and is still at 160 lbs, same amount as when she started the swelling.  She is taking three Lasix per day.

## 2021-12-11 ENCOUNTER — Encounter: Payer: Self-pay | Admitting: Internal Medicine

## 2021-12-11 ENCOUNTER — Other Ambulatory Visit: Payer: Self-pay

## 2021-12-11 ENCOUNTER — Ambulatory Visit (HOSPITAL_COMMUNITY)
Admission: RE | Admit: 2021-12-11 | Discharge: 2021-12-11 | Disposition: A | Discharge status: Home / Self Care | Payer: Medicare HMO | Source: Ambulatory Visit | Attending: Acute Care | Admitting: Acute Care

## 2021-12-11 ENCOUNTER — Ambulatory Visit
Admission: RE | Admit: 2021-12-11 | Discharge: 2021-12-11 | Disposition: A | Discharge status: Home / Self Care | Payer: Medicare HMO | Source: Ambulatory Visit | Attending: Radiation Oncology | Admitting: Radiation Oncology

## 2021-12-11 DIAGNOSIS — E785 Hyperlipidemia, unspecified: Secondary | ICD-10-CM | POA: Diagnosis not present

## 2021-12-11 DIAGNOSIS — R0609 Other forms of dyspnea: Secondary | ICD-10-CM

## 2021-12-11 DIAGNOSIS — Z510 Encounter for antineoplastic radiation therapy: Secondary | ICD-10-CM | POA: Diagnosis not present

## 2021-12-11 DIAGNOSIS — I110 Hypertensive heart disease with heart failure: Secondary | ICD-10-CM | POA: Insufficient documentation

## 2021-12-11 DIAGNOSIS — I509 Heart failure, unspecified: Secondary | ICD-10-CM | POA: Insufficient documentation

## 2021-12-11 DIAGNOSIS — C801 Malignant (primary) neoplasm, unspecified: Secondary | ICD-10-CM | POA: Diagnosis not present

## 2021-12-11 DIAGNOSIS — C3431 Malignant neoplasm of lower lobe, right bronchus or lung: Secondary | ICD-10-CM | POA: Diagnosis not present

## 2021-12-11 DIAGNOSIS — R609 Edema, unspecified: Secondary | ICD-10-CM

## 2021-12-11 DIAGNOSIS — R0600 Dyspnea, unspecified: Secondary | ICD-10-CM

## 2021-12-11 DIAGNOSIS — I082 Rheumatic disorders of both aortic and tricuspid valves: Secondary | ICD-10-CM | POA: Diagnosis not present

## 2021-12-11 DIAGNOSIS — C7951 Secondary malignant neoplasm of bone: Secondary | ICD-10-CM | POA: Diagnosis not present

## 2021-12-11 LAB — RAD ONC ARIA SESSION SUMMARY
Course Elapsed Days: 9
Plan Fractions Treated to Date: 2
Plan Fractions Treated to Date: 8
Plan Fractions Treated to Date: 8
Plan Prescribed Dose Per Fraction: 3 Gy
Plan Prescribed Dose Per Fraction: 3 Gy
Plan Prescribed Dose Per Fraction: 5 Gy
Plan Total Fractions Prescribed: 10
Plan Total Fractions Prescribed: 10
Plan Total Fractions Prescribed: 4
Plan Total Prescribed Dose: 20 Gy
Plan Total Prescribed Dose: 30 Gy
Plan Total Prescribed Dose: 30 Gy
Reference Point Dosage Given to Date: 10 Gy
Reference Point Dosage Given to Date: 24 Gy
Reference Point Dosage Given to Date: 24 Gy
Reference Point Session Dosage Given: 3 Gy
Reference Point Session Dosage Given: 3 Gy
Reference Point Session Dosage Given: 5 Gy
Session Number: 8

## 2021-12-11 LAB — ECHOCARDIOGRAM COMPLETE
AR max vel: 2.53 cm2
AV Area VTI: 2.77 cm2
AV Area mean vel: 2.61 cm2
AV Mean grad: 2 mmHg
AV Peak grad: 3.7 mmHg
Ao pk vel: 0.97 m/s
MV M vel: 3.38 m/s
MV Peak grad: 45.7 mmHg
S' Lateral: 2.5 cm

## 2021-12-12 ENCOUNTER — Ambulatory Visit
Admission: RE | Admit: 2021-12-12 | Discharge: 2021-12-12 | Disposition: A | Discharge status: Home / Self Care | Payer: Medicare HMO | Source: Ambulatory Visit | Attending: Radiation Oncology | Admitting: Radiation Oncology

## 2021-12-12 ENCOUNTER — Other Ambulatory Visit: Payer: Self-pay

## 2021-12-12 DIAGNOSIS — C3431 Malignant neoplasm of lower lobe, right bronchus or lung: Secondary | ICD-10-CM | POA: Diagnosis not present

## 2021-12-12 DIAGNOSIS — C801 Malignant (primary) neoplasm, unspecified: Secondary | ICD-10-CM | POA: Diagnosis not present

## 2021-12-12 DIAGNOSIS — Z510 Encounter for antineoplastic radiation therapy: Secondary | ICD-10-CM | POA: Diagnosis not present

## 2021-12-12 DIAGNOSIS — C7951 Secondary malignant neoplasm of bone: Secondary | ICD-10-CM | POA: Diagnosis not present

## 2021-12-12 LAB — RAD ONC ARIA SESSION SUMMARY
Course Elapsed Days: 10
Plan Fractions Treated to Date: 3
Plan Fractions Treated to Date: 9
Plan Fractions Treated to Date: 9
Plan Prescribed Dose Per Fraction: 3 Gy
Plan Prescribed Dose Per Fraction: 3 Gy
Plan Prescribed Dose Per Fraction: 5 Gy
Plan Total Fractions Prescribed: 10
Plan Total Fractions Prescribed: 10
Plan Total Fractions Prescribed: 4
Plan Total Prescribed Dose: 20 Gy
Plan Total Prescribed Dose: 30 Gy
Plan Total Prescribed Dose: 30 Gy
Reference Point Dosage Given to Date: 15 Gy
Reference Point Dosage Given to Date: 27 Gy
Reference Point Dosage Given to Date: 27 Gy
Reference Point Session Dosage Given: 3 Gy
Reference Point Session Dosage Given: 3 Gy
Reference Point Session Dosage Given: 5 Gy
Session Number: 9

## 2021-12-13 ENCOUNTER — Ambulatory Visit
Admission: RE | Admit: 2021-12-13 | Discharge: 2021-12-13 | Disposition: A | Discharge status: Home / Self Care | Payer: Medicare HMO | Source: Ambulatory Visit | Attending: Radiation Oncology | Admitting: Radiation Oncology

## 2021-12-13 ENCOUNTER — Other Ambulatory Visit: Payer: Self-pay

## 2021-12-13 DIAGNOSIS — Z510 Encounter for antineoplastic radiation therapy: Secondary | ICD-10-CM | POA: Diagnosis not present

## 2021-12-13 DIAGNOSIS — C3431 Malignant neoplasm of lower lobe, right bronchus or lung: Secondary | ICD-10-CM | POA: Diagnosis not present

## 2021-12-13 DIAGNOSIS — C7951 Secondary malignant neoplasm of bone: Secondary | ICD-10-CM | POA: Diagnosis not present

## 2021-12-13 DIAGNOSIS — C801 Malignant (primary) neoplasm, unspecified: Secondary | ICD-10-CM | POA: Diagnosis not present

## 2021-12-13 LAB — RAD ONC ARIA SESSION SUMMARY
Course Elapsed Days: 11
Plan Fractions Treated to Date: 10
Plan Fractions Treated to Date: 10
Plan Fractions Treated to Date: 4
Plan Prescribed Dose Per Fraction: 3 Gy
Plan Prescribed Dose Per Fraction: 3 Gy
Plan Prescribed Dose Per Fraction: 5 Gy
Plan Total Fractions Prescribed: 10
Plan Total Fractions Prescribed: 10
Plan Total Fractions Prescribed: 4
Plan Total Prescribed Dose: 20 Gy
Plan Total Prescribed Dose: 30 Gy
Plan Total Prescribed Dose: 30 Gy
Reference Point Dosage Given to Date: 20 Gy
Reference Point Dosage Given to Date: 30 Gy
Reference Point Dosage Given to Date: 30 Gy
Reference Point Session Dosage Given: 3 Gy
Reference Point Session Dosage Given: 3 Gy
Reference Point Session Dosage Given: 5 Gy
Session Number: 10

## 2021-12-14 DIAGNOSIS — J690 Pneumonitis due to inhalation of food and vomit: Secondary | ICD-10-CM | POA: Diagnosis not present

## 2021-12-14 DIAGNOSIS — R918 Other nonspecific abnormal finding of lung field: Secondary | ICD-10-CM | POA: Diagnosis not present

## 2021-12-14 DIAGNOSIS — R2681 Unsteadiness on feet: Secondary | ICD-10-CM | POA: Diagnosis not present

## 2021-12-16 LAB — GUARDANT 360

## 2021-12-18 ENCOUNTER — Telehealth: Payer: Self-pay

## 2021-12-18 NOTE — Telephone Encounter (Signed)
Received notification from [place] ([place]) regarding approval for [employer]. Patient assistance approved from 12/12/21 to 02/16/22.  Phone: [phone]

## 2021-12-19 ENCOUNTER — Telehealth: Payer: Self-pay | Admitting: Pharmacy Technician

## 2021-12-19 ENCOUNTER — Inpatient Hospital Stay: Payer: Medicare HMO | Attending: Internal Medicine | Admitting: Internal Medicine

## 2021-12-19 ENCOUNTER — Other Ambulatory Visit (HOSPITAL_COMMUNITY): Payer: Self-pay

## 2021-12-19 ENCOUNTER — Telehealth: Payer: Self-pay | Admitting: Pharmacist

## 2021-12-19 ENCOUNTER — Other Ambulatory Visit: Payer: Self-pay

## 2021-12-19 VITALS — BP 113/70 | HR 69 | Temp 97.6°F | Resp 15 | Ht 64.0 in | Wt 151.9 lb

## 2021-12-19 DIAGNOSIS — R5383 Other fatigue: Secondary | ICD-10-CM | POA: Diagnosis not present

## 2021-12-19 DIAGNOSIS — Z5111 Encounter for antineoplastic chemotherapy: Secondary | ICD-10-CM | POA: Diagnosis not present

## 2021-12-19 DIAGNOSIS — C342 Malignant neoplasm of middle lobe, bronchus or lung: Secondary | ICD-10-CM | POA: Insufficient documentation

## 2021-12-19 DIAGNOSIS — R0609 Other forms of dyspnea: Secondary | ICD-10-CM | POA: Diagnosis not present

## 2021-12-19 DIAGNOSIS — C7951 Secondary malignant neoplasm of bone: Secondary | ICD-10-CM | POA: Diagnosis not present

## 2021-12-19 DIAGNOSIS — R059 Cough, unspecified: Secondary | ICD-10-CM | POA: Insufficient documentation

## 2021-12-19 DIAGNOSIS — C787 Secondary malignant neoplasm of liver and intrahepatic bile duct: Secondary | ICD-10-CM | POA: Diagnosis not present

## 2021-12-19 DIAGNOSIS — C3490 Malignant neoplasm of unspecified part of unspecified bronchus or lung: Secondary | ICD-10-CM

## 2021-12-19 DIAGNOSIS — Z79899 Other long term (current) drug therapy: Secondary | ICD-10-CM | POA: Insufficient documentation

## 2021-12-19 MED ORDER — HYDROCODONE BIT-HOMATROP MBR 5-1.5 MG/5ML PO SOLN: 5.0000 mL | Freq: Four times a day (QID) | ORAL | 0 refills | Status: AC | PRN

## 2021-12-19 NOTE — Progress Notes (Signed)
[facility] [phone]   [phone]  OFFICE PROGRESS NOTE  [address] [place], [place] [address]  DIAGNOSIS: Stage IV (T3, N2, M1 C) non-small cell lung cancer, adenocarcinoma presented with right middle lobe collapse in addition to right hilar and mediastinal lymphadenopathy and metastatic disease to the liver and bone diagnosed in October 2023.  Detected Alteration(s) / Biomarker(s) Associated FDA-approved therapies Clinical Trial Availability % cfDNA or Amplification  BRAF V600E approved by FDA Dabrafenib+trametinib, Encorafenib+binimetinib approved in other indication Cobimetinib, Dabrafenib, Trametinib, Vemurafenib, Vemurafenib+cobimetinib Yes 3.6%  TP53 E56* None Yes 4.0%   TP53 Q100H None Yes 3.7%  GNAS R201H None Yes 0.1%  PRIOR THERAPY: Palliative radiotherapy to the bone metastasis in the T5 and right hip under the care of Dr. Dorathy Daft  CURRENT THERAPY: None  INTERVAL HISTORY: Veronica Paul 86 y.o. female returns to the clinic today for follow-up visit accompanied by her 2 daughters.  The patient continues to complain of increasing fatigue and weakness as well as cough and shortness of breath with exertion.  She has no chest pain or hemoptysis.  She has no nausea, vomiting, diarrhea or constipation.  She has no headache or visual changes.  She completed her radiotherapy to the metastatic bone lesion under the care of Dr. Sondra Come and she has more fatigue and weakness and sleeps most of the time after her palliative radiotherapy.  She had molecular studies done by [employer] that showed positive BRAF V600E mutations and she is here for evaluation and discussion of her treatment options.   MEDICAL HISTORY: Past Medical History:  Diagnosis Date   Allergy    "my nose runs alot"   Anxiety    Arthritis    Atrial fibrillation ([place])    Blood transfusion without reported diagnosis    "when I had a  miscarrage in 1957"   Cataract    bilateral removed   Cholelithiasis    Constipation    x ray showed increased stool in colon- uses Miralax daily and Dulcolax twice a week    Depression    Diabetes mellitus    Dyslipidemia    HTN (hypertension)    Hx of long-term (current) use of anticoagulants    Hyperlipidemia    Hypertension    Hypothyroidism    Lung cancer ([place])    Obesity    Osteopenia    Vitamin D deficiency     ALLERGIES:  is allergic to lexapro [escitalopram], ace inhibitors, feldene [piroxicam], meloxicam, prevnar [pneumococcal 13-val conj vacc], statins, zithromax [azithromycin dihydrate], and sulfa antibiotics.  MEDICATIONS:  Current Outpatient Medications  Medication Sig Dispense Refill   acetaminophen (TYLENOL) 500 MG tablet Take 1,000 mg by mouth every 6 (six) hours as needed for moderate pain, headache or fever.     Blood Glucose Monitoring Suppl (ONETOUCH VERIO) w/Device KIT Check blood sugars daily Dx E11.9 1 kit 0   [employer] MELATONIN 10 MG CAPS TAKE 1 CAPSULE BY MOUTH EVERY DAY AT BEDTIME AS NEEDED (Patient taking differently: Take 1 capsule by mouth at bedtime.) 90 capsule 1   diltiazem (CARDIZEM) 90 MG tablet TAKE 1 TABLET (90 MG TOTAL) BY MOUTH 3 (THREE) TIMES DAILY. 270 tablet 1   ELIQUIS 5 MG TABS tablet TAKE 1 TABLET BY MOUTH TWICE A DAY 180 tablet 1   feeding supplement (ENSURE ENLIVE / ENSURE PLUS) LIQD Take 237 mLs by mouth 3 (three) times daily between meals. (Patient taking differently: Take 237 mLs  by mouth daily.) 237 mL 12   furosemide (LASIX) 20 MG tablet 2 po in am and 1 po around 1pm daily 90 tablet 5   glucose blood (ONETOUCH VERIO) test strip CHECK BLOOD SUGARS DAILY E11.9 100 strip 3   Lancet Devices (ONETOUCH DELICA PLUS LANCING) MISC 1 Device by Does not apply route daily. 1 each 0   Lancets (ONETOUCH DELICA PLUS [id number]) MISC USE TO CHECK BLOOD SUGARS DAILY DX E11.9 100 each 3   levothyroxine (SYNTHROID) 88 MCG tablet Take 1 tablet (88 mcg  total) by mouth daily. 90 tablet 1   lidocaine (ASPERCREME LIDOCAINE) 4 % Place 1 patch onto the skin daily as needed (pain).     metolazone (ZAROXOLYN) 5 MG tablet Take 1 tablet (5 mg total) by mouth daily. When told to do so 30 tablet 0   metoprolol tartrate (LOPRESSOR) 100 MG tablet TAKE 1.5 TABLETS BY MOUTH 2 TIMES DAILY. 270 tablet 1   Multiple Vitamin (MULTIVITAMIN) tablet Take 1 tablet by mouth daily.     nitroGLYCERIN (NITROSTAT) 0.4 MG SL tablet Place 1 tablet (0.4 mg total) under the tongue every 5 (five) minutes as needed for chest pain. 25 tablet 1   omeprazole (PRILOSEC) 20 MG capsule TAKE 1 CAPSULE BY MOUTH EVERY DAY 90 capsule 0   oxyCODONE-acetaminophen (PERCOCET/ROXICET) 5-325 MG tablet Take 1 tablet by mouth every 6 (six) hours as needed for severe pain. 30 tablet 0   promethazine-dextromethorphan (PROMETHAZINE-DM) 6.25-15 MG/5ML syrup Take 5 mLs by mouth 4 (four) times daily as needed for cough. 118 mL 0   sertraline (ZOLOFT) 50 MG tablet TAKE 1 TABLET BY MOUTH EVERY DAY 90 tablet 0   No current facility-administered medications for this visit.    SURGICAL HISTORY:  Past Surgical History:  Procedure Laterality Date   CATARACT EXTRACTION Bilateral    COLONOSCOPY     COLONOSCOPY WITH PROPOFOL N/A 05/06/2019   Procedure: COLONOSCOPY WITH PROPOFOL;  Surgeon: Jerene Bears, MD;  Location: [facility];  Service: Gastroenterology;  Laterality: N/A;   ESOPHAGOGASTRODUODENOSCOPY (EGD) WITH PROPOFOL N/A 08/11/2021   Procedure: ESOPHAGOGASTRODUODENOSCOPY (EGD) WITH PROPOFOL;  Surgeon: Harvel Quale, MD;  Location: [facility];  Service: Gastroenterology;  Laterality: N/A;   IR RADIOLOGIST EVAL & MGMT  11/11/2021   LUNG REMOVAL, PARTIAL  1998   Right   POLYPECTOMY  05/06/2019   Procedure: POLYPECTOMY;  Surgeon: Jerene Bears, MD;  Location: [facility];  Service: Gastroenterology;;   Azzie Almas DILATION  08/11/2021   Procedure: SAVORY DILATION;  Surgeon: Montez Morita,  Quillian Quince, MD;  Location: [facility];  Service: Gastroenterology;;   THYROIDECTOMY     TUBAL LIGATION     UPPER GASTROINTESTINAL ENDOSCOPY      REVIEW OF SYSTEMS:  Constitutional: positive for anorexia, fatigue, and weight loss Eyes: negative Ears, nose, mouth, throat, and face: negative Respiratory: positive for cough and dyspnea on exertion Cardiovascular: negative Gastrointestinal: negative Genitourinary:negative Integument/breast: negative Hematologic/lymphatic: negative Musculoskeletal:positive for bone pain and muscle weakness Neurological: positive for weakness Behavioral/Psych: negative Endocrine: negative Allergic/Immunologic: negative   PHYSICAL EXAMINATION: General appearance: alert, cooperative, fatigued, and no distress Head: Normocephalic, without obvious abnormality, atraumatic Neck: no adenopathy, no JVD, supple, symmetrical, trachea midline, and thyroid not enlarged, symmetric, no tenderness/mass/nodules Lymph nodes: Cervical, supraclavicular, and axillary nodes normal. Resp: diminished breath sounds RLL and dullness to percussion RLL Back: symmetric, no curvature. ROM normal. No CVA tenderness. Cardio: regular rate and rhythm, S1, S2 normal, no murmur, click, rub or gallop GI:  soft, non-tender; bowel sounds normal; no masses,  no organomegaly Extremities: extremities normal, atraumatic, no cyanosis or edema Neurologic: Alert and oriented X 3, normal strength and tone. Normal symmetric reflexes. Normal coordination and gait  ECOG PERFORMANCE STATUS: 1 - Symptomatic but completely ambulatory  Blood pressure 113/70, pulse 69, temperature 97.6 F (36.4 C), temperature source Oral, resp. rate 15, height _0  (1.626 m), weight 151 lb 14.4 oz (68.9 kg), last menstrual period 05/11/1992, SpO2 97 %.  LABORATORY DATA: Lab Results  Component Value Date   WBC 11.7 (H) 12/04/2021   HGB 14.1 12/04/2021   HCT 43.5 12/04/2021   MCV 86.0 12/04/2021   PLT 370 12/04/2021       Chemistry      Component Value Date/Time   NA 135 12/04/2021 0853   NA 143 08/19/2021 1149   K 4.2 12/04/2021 0853   CL 99 12/04/2021 0853   CO2 29 12/04/2021 0853   BUN 15 12/04/2021 0853   BUN 21 08/19/2021 1149   CREATININE 0.87 12/04/2021 0853   CREATININE 0.85 12/29/2018 1542   GLU 118 02/10/2012 0000      Component Value Date/Time   CALCIUM 8.6 (L) 12/04/2021 0853   ALKPHOS 267 (H) 12/04/2021 0853   AST 150 (H) 12/04/2021 0853   ALT 48 (H) 12/04/2021 0853   BILITOT 0.6 12/04/2021 0853       RADIOGRAPHIC STUDIES: ECHOCARDIOGRAM COMPLETE  Result Date: 12/11/2021    ECHOCARDIOGRAM REPORT   Patient Name:   GRACIEANN STANNARD Date of Exam: 12/11/2021 Medical Rec #:  [id number]      Height:       64.0 in Accession #:    [id number]     Weight:       161.0 lb Date of Birth:  1936/01/22      BSA:          1.784 m Patient Age:    78 years       BP:           134/60 mmHg Patient Gender: F              HR:           65 bpm. Exam Location:  Outpatient Procedure: 2D Echo, Cardiac Doppler, Color Doppler and Strain Analysis Indications:    CHF  History:        Patient has prior history of Echocardiogram examinations.                 Arrythmias:Atrial Fibrillation; Risk Factors:Dyslipidemia and                 Hypertension.  Sonographer:    Memory Argue Referring Phys: [place]  1. Left ventricular ejection fraction, by estimation, is 55 to 60%. The left ventricle has normal function. The left ventricle has no regional wall motion abnormalities. Left ventricular diastolic parameters are indeterminate.  2. Right ventricular systolic function is mildly reduced. The right ventricular size is mildly enlarged. There is mildly elevated pulmonary artery systolic pressure. The estimated right ventricular systolic pressure is 05.6 mmHg.  3. Left atrial size was severely dilated.  4. The mitral valve is grossly normal. Trivial mitral valve regurgitation.  5. Tricuspid valve  regurgitation is moderate.  6. The aortic valve is tricuspid. There is mild calcification of the aortic valve. There is mild thickening of the aortic valve. Aortic valve regurgitation is mild.  7. The inferior vena cava is normal in size with  greater than 50% respiratory variability, suggesting right atrial pressure of 3 mmHg. Comparison(s): Compared to prior study on 08/2018, there is no significant change. FINDINGS  Left Ventricle: Left ventricular ejection fraction, by estimation, is 55 to 60%. The left ventricle has normal function. The left ventricle has no regional wall motion abnormalities. The left ventricular internal cavity size was normal in size. There is  no left ventricular hypertrophy. Left ventricular diastolic parameters are indeterminate. Right Ventricle: The right ventricular size is mildly enlarged. Right vetricular wall thickness was not well visualized. Right ventricular systolic function is mildly reduced. There is mildly elevated pulmonary artery systolic pressure. The tricuspid regurgitant velocity is 3.06 m/s, and with an assumed right atrial pressure of 3 mmHg, the estimated right ventricular systolic pressure is 57.2 mmHg. Left Atrium: Left atrial size was severely dilated. Right Atrium: Right atrial size was normal in size. Pericardium: Trivial pericardial effusion is present. The pericardial effusion is circumferential. Mitral Valve: The mitral valve is grossly normal. Trivial mitral valve regurgitation. Tricuspid Valve: The tricuspid valve is normal in structure. Tricuspid valve regurgitation is moderate. Aortic Valve: The aortic valve is tricuspid. There is mild calcification of the aortic valve. There is mild thickening of the aortic valve. Aortic valve regurgitation is mild. Aortic valve mean gradient measures 2.0 mmHg. Aortic valve peak gradient measures 3.7 mmHg. Aortic valve area, by VTI measures 2.77 cm. Pulmonic Valve: The pulmonic valve was not well visualized. Pulmonic valve  regurgitation is trivial. Aorta: The aortic root is normal in size and structure. Venous: The inferior vena cava is normal in size with greater than 50% respiratory variability, suggesting right atrial pressure of 3 mmHg. IAS/Shunts: The atrial septum is grossly normal.  LEFT VENTRICLE PLAX 2D LVIDd:         3.60 cm LVIDs:         2.50 cm LV PW:         1.10 cm LV IVS:        1.00 cm LVOT diam:     1.90 cm LV SV:         56 LV SV Index:   32 LVOT Area:     2.84 cm  RIGHT VENTRICLE TAPSE (M-mode): 1.8 cm LEFT ATRIUM             Index        RIGHT ATRIUM           Index LA diam:        4.50 cm 2.52 cm/m   RA Area:     13.20 cm LA Vol (A2C):   67.9 ml 38.06 ml/m  RA Volume:   29.60 ml  16.59 ml/m LA Vol (A4C):   98.1 ml 54.99 ml/m LA Biplane Vol: 89.8 ml 50.33 ml/m  AORTIC VALVE AV Area (Vmax):    2.53 cm AV Area (Vmean):   2.61 cm AV Area (VTI):     2.77 cm AV Vmax:           96.50 cm/s AV Vmean:          62.700 cm/s AV VTI:            0.204 m AV Peak Grad:      3.7 mmHg AV Mean Grad:      2.0 mmHg LVOT Vmax:         86.20 cm/s LVOT Vmean:        57.700 cm/s LVOT VTI:          0.199 m  LVOT/AV VTI ratio: 0.98  AORTA Ao Root diam: 2.90 cm MR Peak grad: 45.7 mmHg   TRICUSPID VALVE MR Vmax:      338.00 cm/s TR Peak grad:   37.5 mmHg                           TR Vmax:        306.00 cm/s                            SHUNTS                           Systemic VTI:  0.20 m                           Systemic Diam: 1.90 cm Gwyndolyn Kaufman MD Electronically signed by Gwyndolyn Kaufman MD Signature Date/Time: 12/11/2021/7:53:00 PM    Final    DG Chest 2 View  Result Date: 12/06/2021 CLINICAL DATA:  Dyspnea, edema EXAM: CHEST - 2 VIEW COMPARISON:  09/24/2021 FINDINGS: Unchanged mild cardiac enlargement. Increased right pleural effusion with associated atelectasis. Postsurgical changes in the left lung is clear. No pneumothorax. No acute osseous abnormality. IMPRESSION: Increased right pleural effusion with associated  atelectasis, although a superimposed infectious process cannot be excluded. Electronically Signed   By: Merilyn Baba M.D.   On: 12/06/2021 12:27    ASSESSMENT AND PLAN: This is a very pleasant 86 years old white female with Stage IV (T3, N2, M1 C) non-small cell lung cancer, adenocarcinoma presented with right middle lobe collapse in addition to right hilar and mediastinal lymphadenopathy and metastatic disease to the liver and bone diagnosed in October 2023. Her molecular studies showed positive BRAF V600E.  The patient would have been good candidate for treatment with dabrafenib/trametinib or Encorafenib+binimetinib but her general condition is very poor and I am not sure she would be able to tolerate the adverse effect of this treatment and also she has difficulty swallowing to receive this oral medication. Her other option would be systemic chemotherapy with immunotherapy but again the patient has very poor performance status and she would not be candidate for this treatment. I had a lengthy discussion with the patient and her family about her condition and the treatment options.  I strongly recommend for the patient and her daughters to consider palliative care and hospice at this point but I will be happy to consider her for treatment with targeted therapy if this is her wishes. The family would like to meet with the pharmacist for oral oncolytic and she had an appointment with them after my visit and provided them with information about the oral medication as well as the adverse effects. They would like to take some time to think about her treatment options but likely this patient may end with palliative care and hospice. For the bone metastasis, she received palliative radiotherapy to the metastatic painful bone lesions under the care of Dr. Sondra Come.  She will continue with her current pain medication. For the cough, I will send a prescription of Hycodan to her pharmacy. I will arrange for the  patient a follow-up appointment with me after they make a decision regarding her treatment options. She was advised to call immediately if she has any other concerning symptoms in the interval. The patient voices understanding of current disease status  and treatment options and is in agreement with the current care plan.  All questions were answered. The patient knows to call the clinic with any problems, questions or concerns. We can certainly see the patient much sooner if necessary.  The total time spent in the appointment was 35 minutes.  Disclaimer: This note was dictated with voice recognition software. Similar sounding words can inadvertently be transcribed and may not be corrected upon review.

## 2021-12-19 NOTE — Telephone Encounter (Signed)
Oral Oncology Patient Advocate Encounter  Prior Authorization for Mekinist/Tafinlar has been approved.    [employer] PA# [id number] Vonna Kotyk [id number]  Effective dates: 12/19/2021 through 02/16/2022  Patients co-pays are as follows:  Mekinist $2091.75 Tafinar $2026.71    Lady Deutscher, CPhT-Adv Oncology Pharmacy Patient [place] Direct Number: [phone]  Fax: [phone]

## 2021-12-19 NOTE — Telephone Encounter (Signed)
Oral Oncology Patient Advocate Encounter   Received notification that prior authorization for Mekinist/Tafinlar is required.   PA submitted on 12/19/2021 [employer]ist Key: Shara Blazing Key: [id number] Status is pending     Lady Deutscher, CPhT-Adv Oncology Pharmacy Patient [place] Direct Number: [phone]  Fax: [phone]

## 2021-12-19 NOTE — Telephone Encounter (Signed)
Oral Chemotherapy Pharmacist Encounter  I met with patient and patient's daughters in clinic to discuss the differences in possible treatment with Braftovi and Mektovi OR Tafinlar and Mekinist for the treatment of metastatic non-small cell lung cancer, BRAF V600E mutated, planned duration until disease progression or unacceptable drug toxicity.  Discussed with patient's daughters, who are her caregivers, administration, dosing, side effects, monitoring, drug-food interactions, safe handling, storage, and disposal for Braftovi and Mektovi as well as Medical illustrator.  The following was discussed regarding Braftovi and Mektovi:   Patient would take Braftovi 75 mg capsules, 3 capsules (225 mg) by mouth once daily, without regard to food. Patient would require an upfront dose reduction of Braftovi due to drug-drug interaction with patient's diltiazem. When used in combination with diltiazem, Braftovi is required to be dose reduced to 225 mg daily.  Patient will take Mektovi 15 mg tablets, 3 tablets (45 mg) by mouth 2 times daily, without regard to food, approximately 12 hours apart.  Patient's daughters understand that Braftovi and Irean Hong are used in combination.  Discussed avoidance of grapefruit and grapefruit juice while on therapy with Braftovi and Mektovi.  Adverse effects of Braftovi include but are not limited to: Fatigue, nausea, vomiting, abdominal pain, arthralgia, headache, dizziness, hyperkeratosis or HFS, uveitis (eye pain, photophobia, vision changes), hemorrhage, and QTc prolongation.  Adverse effects of Mektovi include but are not limited to: Fatigue, nausea, vomiting, diarrhea, increased creatinine, increased LFTs, fever, rash, vision changes, peripheral edema, cardiac dysfunction, and hemorrhage  After further review into patient's medication list, it was found that Braftovi interacts with patient's Eliquis (apixaban). Specifically Braftovi, a strong CYP3A4 inducer, would  decrease serum concentrations of apixaban, leading to decreased efficacy. The use of this combination together is not recommended if possible. Drug-drug interaction discussed with patient's daugthers, and they are more interested in considering Tafinlar and Mekinist combination due to decreased drug-drug interactions, as well as decreased pill burden. They request a prior authorization be submitted for Tafinlar and Mekinist at this time.   In regards to [place] and [place], the following was discussed:   Patient will take Tafinlar 81m capsules, 2 capsules (1537m by mouth twice daily.   Patient will take Mekinist 65m65mablets,1 tablet by mouth once daily.   These will be taken on an empty stomach, 1 hour before or 2 hours after a meal.   Patient will take Tafinlar and Mekinist in the morning before breakfast, and will take Tafinlar again in the evening before bed, 2 hours after dinner.  Mekinist will be stored in the refrigerator.  Adverse effects include but are not limited to: drug fever, rash, nausea, vomiting, diarrhea, peripheral edema, decreased LVEF, hyperglycemia, and pulmonary toxicity.  Fever: Patient's daughters  instructed that febrile reactions can occur with Tafinlar and Mekinist therapy. Patient's daughters instructed to interrupt therapy for any fever over 101.35F and to alert the office. Diarrhea: Patient's daughters will obtain anti diarrheal and alert the office of 4 or more loose stools above baseline.   Reviewed importance of keeping a medication schedule and plan for any missed doses. No barriers to medication adherence identified.  Medication reconciliation performed and medication/allergy list updated.   Oral Oncology Clinic will continue to follow for insurance authorization, copayment issues, as well as patient and patient's family's final decision on treatment.   RebLeron CroakharmD, BCPS, BCORegional Medical Centermatology/Oncology Clinical Pharmacist [id number] 355 8999/03/2021 3:27 PM

## 2021-12-20 ENCOUNTER — Other Ambulatory Visit: Payer: Self-pay | Admitting: Nurse Practitioner

## 2021-12-20 MED ORDER — MEGESTROL ACETATE 20 MG PO TABS: 20.0000 mg | ORAL_TABLET | Freq: Two times a day (BID) | ORAL | 1 refills | Status: DC

## 2021-12-23 ENCOUNTER — Ambulatory Visit (INDEPENDENT_AMBULATORY_CARE_PROVIDER_SITE_OTHER): Payer: Medicare HMO | Admitting: Nurse Practitioner

## 2021-12-23 ENCOUNTER — Encounter: Payer: Self-pay | Admitting: Nurse Practitioner

## 2021-12-23 VITALS — BP 99/70 | HR 105 | Temp 96.6°F | Resp 20 | Ht 64.0 in | Wt 148.0 lb

## 2021-12-23 DIAGNOSIS — I4821 Permanent atrial fibrillation: Secondary | ICD-10-CM

## 2021-12-23 DIAGNOSIS — E039 Hypothyroidism, unspecified: Secondary | ICD-10-CM

## 2021-12-23 DIAGNOSIS — Z6834 Body mass index (BMI) 34.0-34.9, adult: Secondary | ICD-10-CM

## 2021-12-23 DIAGNOSIS — R69 Illness, unspecified: Secondary | ICD-10-CM | POA: Diagnosis not present

## 2021-12-23 DIAGNOSIS — C3490 Malignant neoplasm of unspecified part of unspecified bronchus or lung: Secondary | ICD-10-CM

## 2021-12-23 DIAGNOSIS — E119 Type 2 diabetes mellitus without complications: Secondary | ICD-10-CM

## 2021-12-23 DIAGNOSIS — C7951 Secondary malignant neoplasm of bone: Secondary | ICD-10-CM

## 2021-12-23 DIAGNOSIS — E782 Mixed hyperlipidemia: Secondary | ICD-10-CM

## 2021-12-23 DIAGNOSIS — R1319 Other dysphagia: Secondary | ICD-10-CM | POA: Diagnosis not present

## 2021-12-23 DIAGNOSIS — I4891 Unspecified atrial fibrillation: Secondary | ICD-10-CM | POA: Diagnosis not present

## 2021-12-23 DIAGNOSIS — R609 Edema, unspecified: Secondary | ICD-10-CM | POA: Diagnosis not present

## 2021-12-23 DIAGNOSIS — F419 Anxiety disorder, unspecified: Secondary | ICD-10-CM

## 2021-12-23 DIAGNOSIS — Z23 Encounter for immunization: Secondary | ICD-10-CM | POA: Diagnosis not present

## 2021-12-23 DIAGNOSIS — R600 Localized edema: Secondary | ICD-10-CM

## 2021-12-23 DIAGNOSIS — F3341 Major depressive disorder, recurrent, in partial remission: Secondary | ICD-10-CM | POA: Diagnosis not present

## 2021-12-23 DIAGNOSIS — I10 Essential (primary) hypertension: Secondary | ICD-10-CM | POA: Diagnosis not present

## 2021-12-23 LAB — BAYER DCA HB A1C WAIVED: HB A1C (BAYER DCA - WAIVED): 5.9 % — ABNORMAL HIGH (ref 4.8–5.6)

## 2021-12-23 MED ORDER — SERTRALINE HCL 50 MG PO TABS: 50.0000 mg | ORAL_TABLET | Freq: Every day | ORAL | 0 refills | Status: AC

## 2021-12-23 MED ORDER — MEGESTROL ACETATE 400 MG/10ML PO SUSP: 400.0000 mg | Freq: Two times a day (BID) | ORAL | 0 refills | Status: AC

## 2021-12-23 MED ORDER — LEVOTHYROXINE SODIUM 88 MCG PO TABS: 88.0000 ug | ORAL_TABLET | Freq: Every day | ORAL | 1 refills | Status: AC

## 2021-12-23 MED ORDER — DILTIAZEM HCL 90 MG PO TABS: 90.0000 mg | ORAL_TABLET | Freq: Three times a day (TID) | ORAL | 1 refills | Status: AC

## 2021-12-23 MED ORDER — FUROSEMIDE 20 MG PO TABS: ORAL_TABLET | ORAL | 5 refills | Status: AC

## 2021-12-23 MED ORDER — METOPROLOL TARTRATE 100 MG PO TABS: ORAL_TABLET | ORAL | 1 refills | Status: AC

## 2021-12-23 MED ORDER — APIXABAN 5 MG PO TABS: 5.0000 mg | ORAL_TABLET | Freq: Two times a day (BID) | ORAL | 1 refills | Status: AC

## 2021-12-23 MED ORDER — OMEPRAZOLE 20 MG PO CPDR: 20.0000 mg | DELAYED_RELEASE_CAPSULE | Freq: Every day | ORAL | 0 refills | Status: AC

## 2021-12-23 NOTE — Progress Notes (Signed)
Subjective:    Patient ID: Veronica Paul, female    DOB: 1935-04-06, 86 y.o.   MRN: [id number]   Chief Complaint: Medical Management of Chronic Issues    HPI:  Veronica Paul is a 86 y.o. who identifies as a female who was assigned female at birth.   Social history: Lives with: by herself Work history: retired   Scientist, forensic in today for follow up of the following chronic medical issues:  1. Primary hypertension No c/o chest pain, sob or headache. Does not check blood pressure at home. BP Readings from Last 3 Encounters:  12/19/21 113/70  12/09/21 134/60  12/06/21 (!) 110/50     2. Diabetes mellitus without complication ([place]) Has not been checking blood sugars daily Lab Results  Component Value Date   HGBA1C 5.8 (H) 06/24/2021    3. Mixed hyperlipidemia Has a very poor appetite.  Lab Results  Component Value Date   CHOL 177 06/24/2021   HDL 58 06/24/2021   LDLCALC 105 (H) 06/24/2021   TRIG 76 06/24/2021   CHOLHDL 3.1 06/24/2021     4. Acquired hypothyroidism No issues that aware of. Lab Results  Component Value Date   TSH 4.073 08/07/2021     5. Permanent atrial fibrillation (HCC) No palpitations  6. Anxiety Stays anxious    08/19/2021   11:21 AM 06/24/2021    9:49 AM 05/07/2021    8:49 AM 03/07/2021   10:04 AM  GAD 7 : Generalized Anxiety Score  Nervous, Anxious, on Edge 0 0 0 1  Control/stop worrying 0 0 0 0  Worry too much - different things 0 0 0 1  Trouble relaxing 0 0 0 1  Restless 0 0 0 0  Easily annoyed or irritable 0 0 0 0  Afraid - awful might happen 0 0 0 0  Total GAD 7 Score 0 0 0 3  Anxiety Difficulty Not difficult at all Not difficult at all Not difficult at all Not difficult at all      7. BMI 34.0-34.9,adult Weight is gradually coming down. Has very poor appetite and recently sent megace in but they have not started taking. Wt Readings from Last 3 Encounters:  12/23/21 148 lb (67.1 kg)  12/19/21 151 lb 14.4 oz (68.9 kg)   12/06/21 161 lb (73 kg)   BMI Readings from Last 3 Encounters:  12/23/21 25.40 kg/m  12/19/21 26.07 kg/m  12/06/21 27.64 kg/m      New complaints: Constipation- has tried miralax but not helping.  Allergies  Allergen Reactions   Lexapro [Escitalopram] Shortness Of Breath   Ace Inhibitors Cough   Feldene [Piroxicam]     RASH TO HANDS   Meloxicam Nausea And Vomiting   Prevnar [Pneumococcal 13-Val Conj Vacc]     Unknown reaction   Statins Other (See Comments)    myalgia   Zithromax [Azithromycin Dihydrate] Itching   Sulfa Antibiotics Rash   Outpatient Encounter Medications as of 12/23/2021  Medication Sig   megestrol (MEGACE) 20 MG tablet Take 1 tablet (20 mg total) by mouth 2 (two) times daily.   acetaminophen (TYLENOL) 500 MG tablet Take 1,000 mg by mouth every 6 (six) hours as needed for moderate pain, headache or fever.   Blood Glucose Monitoring Suppl (ONETOUCH VERIO) w/Device KIT Check blood sugars daily Dx E11.9   [employer] MELATONIN 10 MG CAPS TAKE 1 CAPSULE BY MOUTH EVERY DAY AT BEDTIME AS NEEDED (Patient taking differently: Take 1 capsule by mouth at bedtime.)  diltiazem (CARDIZEM) 90 MG tablet TAKE 1 TABLET (90 MG TOTAL) BY MOUTH 3 (THREE) TIMES DAILY.   ELIQUIS 5 MG TABS tablet TAKE 1 TABLET BY MOUTH TWICE A DAY   feeding supplement (ENSURE ENLIVE / ENSURE PLUS) LIQD Take 237 mLs by mouth 3 (three) times daily between meals. (Patient taking differently: Take 237 mLs by mouth daily.)   furosemide (LASIX) 20 MG tablet 2 po in am and 1 po around 1pm daily   glucose blood (ONETOUCH VERIO) test strip CHECK BLOOD SUGARS DAILY E11.9   HYDROcodone bit-homatropine (HYCODAN) 5-1.5 MG/5ML syrup Take 5 mLs by mouth every 6 (six) hours as needed for cough.   Lancet Devices (ONETOUCH DELICA PLUS LANCING) MISC 1 Device by Does not apply route daily.   Lancets (ONETOUCH DELICA PLUS [id number]) MISC USE TO CHECK BLOOD SUGARS DAILY DX E11.9   levothyroxine (SYNTHROID) 88 MCG tablet Take  1 tablet (88 mcg total) by mouth daily.   lidocaine (ASPERCREME LIDOCAINE) 4 % Place 1 patch onto the skin daily as needed (pain).   metolazone (ZAROXOLYN) 5 MG tablet Take 1 tablet (5 mg total) by mouth daily. When told to do so   metoprolol tartrate (LOPRESSOR) 100 MG tablet TAKE 1.5 TABLETS BY MOUTH 2 TIMES DAILY.   Multiple Vitamin (MULTIVITAMIN) tablet Take 1 tablet by mouth daily.   nitroGLYCERIN (NITROSTAT) 0.4 MG SL tablet Place 1 tablet (0.4 mg total) under the tongue every 5 (five) minutes as needed for chest pain.   omeprazole (PRILOSEC) 20 MG capsule TAKE 1 CAPSULE BY MOUTH EVERY DAY   oxyCODONE-acetaminophen (PERCOCET/ROXICET) 5-325 MG tablet Take 1 tablet by mouth every 6 (six) hours as needed for severe pain.   promethazine-dextromethorphan (PROMETHAZINE-DM) 6.25-15 MG/5ML syrup Take 5 mLs by mouth 4 (four) times daily as needed for cough.   sertraline (ZOLOFT) 50 MG tablet TAKE 1 TABLET BY MOUTH EVERY DAY   No facility-administered encounter medications on file as of 12/23/2021.    Past Surgical History:  Procedure Laterality Date   CATARACT EXTRACTION Bilateral    COLONOSCOPY     COLONOSCOPY WITH PROPOFOL N/A 05/06/2019   Procedure: COLONOSCOPY WITH PROPOFOL;  Surgeon: Jerene Bears, MD;  Location: [facility];  Service: Gastroenterology;  Laterality: N/A;   ESOPHAGOGASTRODUODENOSCOPY (EGD) WITH PROPOFOL N/A 08/11/2021   Procedure: ESOPHAGOGASTRODUODENOSCOPY (EGD) WITH PROPOFOL;  Surgeon: Harvel Quale, MD;  Location: [facility];  Service: Gastroenterology;  Laterality: N/A;   IR RADIOLOGIST EVAL & MGMT  11/11/2021   LUNG REMOVAL, PARTIAL  1998   Right   POLYPECTOMY  05/06/2019   Procedure: POLYPECTOMY;  Surgeon: Jerene Bears, MD;  Location: [facility];  Service: Gastroenterology;;   Azzie Almas DILATION  08/11/2021   Procedure: Azzie Almas DILATION;  Surgeon: Montez Morita, Quillian Quince, MD;  Location: [facility];  Service: Gastroenterology;;   THYROIDECTOMY      TUBAL LIGATION     UPPER GASTROINTESTINAL ENDOSCOPY      Family History  Problem Relation Age of Onset   Coronary artery disease Brother 93   Hypertension Brother    Heart disease Brother    Hypertension Mother    Hypertension Father    Heart disease Father    Hypertension Sister    Heart disease Sister    Cancer Sister    Arthritis Daughter    COPD Daughter    Fibromyalgia Daughter    Ulcerative colitis Son    Stroke Maternal Grandmother    Cancer Brother    Lung cancer Brother  Hypertension Sister    Cancer Sister    Colon cancer Neg Hx    Food intolerance Neg Hx    Esophageal cancer Neg Hx    Rectal cancer Neg Hx    Stomach cancer Neg Hx    Colon polyps Neg Hx       Controlled substance contract: n/a     Review of Systems  Constitutional:  Negative for diaphoresis.  Eyes:  Negative for pain.  Respiratory:  Negative for shortness of breath.   Cardiovascular:  Negative for chest pain, palpitations and leg swelling.  Gastrointestinal:  Negative for abdominal pain.  Endocrine: Negative for polydipsia.  Skin:  Negative for rash.  Neurological:  Negative for dizziness, weakness and headaches.  Hematological:  Does not bruise/bleed easily.  All other systems reviewed and are negative.      Objective:   Physical Exam Vitals and nursing note reviewed.  Constitutional:      General: She is not in acute distress.    Appearance: Normal appearance. She is well-developed.  HENT:     Head: Normocephalic.     Right Ear: Tympanic membrane normal.     Left Ear: Tympanic membrane normal.     Nose: Nose normal.     Mouth/Throat:     Mouth: Mucous membranes are moist.  Eyes:     Pupils: Pupils are equal, round, and reactive to light.  Neck:     Vascular: No carotid bruit or JVD.  Cardiovascular:     Rate and Rhythm: Normal rate and regular rhythm.     Heart sounds: Normal heart sounds.  Pulmonary:     Effort: Pulmonary effort is normal. No respiratory  distress.     Breath sounds: Normal breath sounds. No wheezing or rales.  Chest:     Chest wall: No tenderness.  Abdominal:     General: Bowel sounds are normal. There is no distension or abdominal bruit.     Palpations: Abdomen is soft. There is no hepatomegaly, splenomegaly, mass or pulsatile mass.     Tenderness: There is no abdominal tenderness.  Musculoskeletal:        General: Normal range of motion.     Cervical back: Normal range of motion and neck supple.  Lymphadenopathy:     Cervical: No cervical adenopathy.  Skin:    General: Skin is warm and dry.  Neurological:     Mental Status: She is alert and oriented to person, place, and time.     Deep Tendon Reflexes: Reflexes are normal and symmetric.  Psychiatric:        Behavior: Behavior normal.        Thought Content: Thought content normal.        Judgment: Judgment normal.   BP 99/70   Pulse (!) 105   Temp (!) 96.6 F (35.9 C) (Temporal)   Resp 20   Ht _0  (1.626 m)   Wt 148 lb (67.1 kg)   LMP 05/11/1992   SpO2 93%   BMI 25.40 kg/m    Hgba1c 5.9%      Assessment & Plan:   ANACAREN KOHAN comes in today with chief complaint of Medical Management of Chronic Issues   Diagnosis and orders addressed:  1. Primary hypertension Low sodium diet - CBC with Differential/Platelet - CMP14+EGFR  2. Diabetes mellitus without complication (HCC) Continue to wtahc carbs in diet - Bayer DCA Hb A1c Waived  3. Mixed hyperlipidemia Low fat diet - Lipid panel  4. Acquired  hypothyroidism Labs pending - levothyroxine (SYNTHROID) 88 MCG tablet; Take 1 tablet (88 mcg total) by mouth daily.  Dispense: 90 tablet; Refill: 1  5. Permanent atrial fibrillation (HCC) Avid caffeine  6. Anxiety Stress manageemnt  7. BMI 34.0-34.9,adult Discussed diet and exercise for person with BMI >25 Will recheck weight in 3-6 months   8. Atrial fibrillation with RVR (HCC)  - diltiazem (CARDIZEM) 90 MG tablet; Take 1 tablet (90  mg total) by mouth 3 (three) times daily.  Dispense: 270 tablet; Refill: 1 - metoprolol tartrate (LOPRESSOR) 100 MG tablet; TAKE 1.5 TABLETS BY MOUTH 2 TIMES DAILY.  Dispense: 270 tablet; Refill: 1 - apixaban (ELIQUIS) 5 MG TABS tablet; Take 1 tablet (5 mg total) by mouth 2 (two) times daily.  Dispense: 180 tablet; Refill: 1  9. Esophageal dysphagia - omeprazole (PRILOSEC) 20 MG capsule; Take 1 capsule (20 mg total) by mouth daily.  Dispense: 90 capsule; Refill: 0  10. Peripheral edema Elevate legs when sitting  Only use zaroxyln when told to do so - furosemide (LASIX) 20 MG tablet; 2 po in am and 1 po around 1pm daily  Dispense: 90 tablet; Refill: 5  11. Recurrent major depressive disorder, in partial remission (HCC) - sertraline (ZOLOFT) 50 MG tablet; Take 1 tablet (50 mg total) by mouth daily.  Dispense: 90 tablet; Refill: 0   Labs pending Health Maintenance reviewed Diet and exercise encouraged  Follow up plan: 3 months   Veronica Hassell Done, FNP

## 2021-12-23 NOTE — Patient Instructions (Signed)
Peripheral Edema  Peripheral edema is swelling that is caused by a buildup of fluid. Peripheral edema most often affects the lower legs, ankles, and feet. It can also develop in the arms, hands, and face. The area of the body that has peripheral edema will look swollen. It may also feel heavy or warm. Your clothes may start to feel tight. Pressing on the area may make a temporary dent in your skin (pitting edema). You may not be able to move your swollen arm or leg as much as usual. There are many causes of peripheral edema. It can happen because of a complication of other conditions such as heart failure, kidney disease, or a problem with your circulation. It also can be a side effect of certain medicines or happen because of an infection. It often happens to women during pregnancy. Sometimes, the cause is not known. Follow these instructions at home: Managing pain, stiffness, and swelling  Raise (elevate) your legs while you are sitting or lying down. Move around often to prevent stiffness and to reduce swelling. Do not sit or stand for long periods of time. Do not wear tight clothing. Do not wear garters on your upper legs. Exercise your legs to get your circulation going. This helps to move the fluid back into your blood vessels, and it may help the swelling go down. Wear compression stockings as told by your health care provider. These stockings help to prevent blood clots and reduce swelling in your legs. It is important that these are the correct size. These stockings should be prescribed by your doctor to prevent possible injuries. If elastic bandages or wraps are recommended, use them as told by your health care provider. Medicines Take over-the-counter and prescription medicines only as told by your health care provider. Your health care provider may prescribe medicine to help your body get rid of excess water (diuretic). Take this medicine if you are told to take it. General  instructions Eat a low-salt (low-sodium) diet as told by your health care provider. Sometimes, eating less salt may reduce swelling. Pay attention to any changes in your symptoms. Moisturize your skin daily to help prevent skin from cracking and draining. Keep all follow-up visits. This is important. Contact a health care provider if: You have a fever. You have swelling in only one leg. You have increased swelling, redness, or pain in one or both of your legs. You have drainage or sores at the area where you have edema. Get help right away if: You have edema that starts suddenly or is getting worse, especially if you are pregnant or have a medical condition. You develop shortness of breath, especially when you are lying down. You have pain in your chest or abdomen. You feel weak. You feel like you will faint. These symptoms may be an emergency. Get help right away. Call 911. Do not wait to see if the symptoms will go away. Do not drive yourself to the hospital. Summary Peripheral edema is swelling that is caused by a buildup of fluid. Peripheral edema most often affects the lower legs, ankles, and feet. Move around often to prevent stiffness and to reduce swelling. Do not sit or stand for long periods of time. Pay attention to any changes in your symptoms. Contact a health care provider if you have edema that starts suddenly or is getting worse, especially if you are pregnant or have a medical condition. Get help right away if you develop shortness of breath, especially when lying down.   This information is not intended to replace advice given to you by your health care provider. Make sure you discuss any questions you have with your health care provider. Document Revised: 10/08/2020 Document Reviewed: 10/08/2020 Elsevier Patient Education  2023 Elsevier Inc.  

## 2021-12-23 NOTE — Addendum Note (Signed)
Addended by: Rolena Infante on: 12/23/2021 01:42 PM   Modules accepted: Orders

## 2021-12-24 LAB — CMP14+EGFR
ALT: 59 IU/L — ABNORMAL HIGH (ref 0–32)
AST: 224 IU/L — ABNORMAL HIGH (ref 0–40)
Albumin/Globulin Ratio: 1.2 (ref 1.2–2.2)
Albumin: 3.1 g/dL — ABNORMAL LOW (ref 3.7–4.7)
Alkaline Phosphatase: 308 IU/L — ABNORMAL HIGH (ref 44–121)
BUN/Creatinine Ratio: 31 — ABNORMAL HIGH (ref 12–28)
BUN: 40 mg/dL — ABNORMAL HIGH (ref 8–27)
Bilirubin Total: 1.1 mg/dL (ref 0.0–1.2)
CO2: 28 mmol/L (ref 20–29)
Calcium: 8.5 mg/dL — ABNORMAL LOW (ref 8.7–10.3)
Chloride: 88 mmol/L — ABNORMAL LOW (ref 96–106)
Creatinine, Ser: 1.29 mg/dL — ABNORMAL HIGH (ref 0.57–1.00)
Globulin, Total: 2.6 g/dL (ref 1.5–4.5)
Glucose: 189 mg/dL — ABNORMAL HIGH (ref 70–99)
Potassium: 2.8 mmol/L — ABNORMAL LOW (ref 3.5–5.2)
Sodium: 137 mmol/L (ref 134–144)
Total Protein: 5.7 g/dL — ABNORMAL LOW (ref 6.0–8.5)
eGFR: 41 mL/min/{1.73_m2} — ABNORMAL LOW (ref >59)

## 2021-12-24 LAB — CBC WITH DIFFERENTIAL/PLATELET
Basophils Absolute: 0.1 10*3/uL (ref 0.0–0.2)
Basos: 0 %
EOS (ABSOLUTE): 0.3 10*3/uL (ref 0.0–0.4)
Eos: 2 %
Hematocrit: 47 % — ABNORMAL HIGH (ref 34.0–46.6)
Hemoglobin: 15.8 g/dL (ref 11.1–15.9)
Immature Grans (Abs): 0.1 10*3/uL (ref 0.0–0.1)
Immature Granulocytes: 1 %
Lymphocytes Absolute: 0.4 10*3/uL — ABNORMAL LOW (ref 0.7–3.1)
Lymphs: 3 %
MCH: 28.1 pg (ref 26.6–33.0)
MCHC: 33.6 g/dL (ref 31.5–35.7)
MCV: 84 fL (ref 79–97)
Monocytes Absolute: 1.1 10*3/uL — ABNORMAL HIGH (ref 0.1–0.9)
Monocytes: 8 %
Neutrophils Absolute: 12.4 10*3/uL — ABNORMAL HIGH (ref 1.4–7.0)
Neutrophils: 86 %
Platelets: 287 10*3/uL (ref 150–450)
RBC: 5.63 x10E6/uL — ABNORMAL HIGH (ref 3.77–5.28)
RDW: 15.1 % (ref 11.7–15.4)
WBC: 14.4 10*3/uL — ABNORMAL HIGH (ref 3.4–10.8)

## 2021-12-24 LAB — LIPID PANEL
Chol/HDL Ratio: 6.5 ratio — ABNORMAL HIGH (ref 0.0–4.4)
Cholesterol, Total: 202 mg/dL — ABNORMAL HIGH (ref 100–199)
HDL: 31 mg/dL — ABNORMAL LOW (ref >39)
LDL Chol Calc (NIH): 143 mg/dL — ABNORMAL HIGH (ref 0–99)
Triglycerides: 153 mg/dL — ABNORMAL HIGH (ref 0–149)
VLDL Cholesterol Cal: 28 mg/dL (ref 5–40)

## 2021-12-24 MED ORDER — POTASSIUM CHLORIDE 20 MEQ PO PACK: 20.0000 meq | PACK | Freq: Two times a day (BID) | ORAL | 3 refills | Status: AC

## 2021-12-24 NOTE — Addendum Note (Signed)
Addended by: Chevis Pretty on: 12/24/2021 01:32 PM   Modules accepted: Orders

## 2021-12-24 NOTE — Telephone Encounter (Signed)
Oral Chemotherapy Pharmacist Encounter   Spoke with patient's daughter, Veronica Paul, regarding further questions about Mekinist/Tafinlar combination for patient's metastatic NSCLC, BRAF [id number] mutated.   Patient's daughter concerned regarding her mother's dysphagia and ability to take pills. We discussed, again, that Mekinist tablets could be dissolved in water, but this information is based on case reports provided by [employer], the drug manufacturer. The opening of Tafinlar capsules is not recommended, although there are some case reports of this being done for patients in which it was administered via a gastrostomy tube.  I also confirmed with patient's daughter, that there were less drug-drug interactions with the Mekinist/Tafinlar which would not require alterations in patient's other medication regimen, which was previously discussed with Neoma Laming on 12/19/21.  No other questions from daughter at this time. She stated that their family was meeting today with patient to discuss next steps. They will reach out to Dr. Worthy Flank office with final decision.   Leron Croak, PharmD, BCPS, [facility] Hematology/Oncology Clinical Pharmacist [facility] and [facility] [phone] 12/24/2021 3:42 PM

## 2021-12-25 ENCOUNTER — Telehealth: Payer: Self-pay | Admitting: Medical Oncology

## 2021-12-25 ENCOUNTER — Telehealth: Payer: Self-pay

## 2021-12-25 DIAGNOSIS — Z515 Encounter for palliative care: Secondary | ICD-10-CM

## 2021-12-25 DIAGNOSIS — C3490 Malignant neoplasm of unspecified part of unspecified bronchus or lung: Secondary | ICD-10-CM

## 2021-12-25 NOTE — Telephone Encounter (Signed)
Spoke with patient's daughter Neoma Laming regarding Palliative Care services. D/T new Palliative criteria patient would only been seen virtually since she resides in [place]. Daughter has declined virtual visits. She reports she has requested a referral from patient's PCP for [facility]. Will close Palliative Care referral with [facility] and notify referring provider.

## 2021-12-25 NOTE — Telephone Encounter (Signed)
Requests Hospice Daughter stated she and pt discussed treatment . Pt declined  treatment with targeted therapy immunotherapy . Requests hospice.  Referral made.

## 2021-12-25 NOTE — Telephone Encounter (Signed)
Oral Chemotherapy Pharmacist Encounter     Noted in chart that patient has declined treatment. Oral chemotherapy clinic will stop following at this time.   Leron Croak, PharmD, BCPS, [facility] Hematology/Oncology Clinical Pharmacist [facility] and [place] [phone] 12/25/2021 2:38 PM

## 2021-12-26 ENCOUNTER — Ambulatory Visit: Payer: Medicare HMO | Admitting: Nurse Practitioner

## 2021-12-26 ENCOUNTER — Other Ambulatory Visit: Payer: Self-pay

## 2021-12-26 DIAGNOSIS — C3490 Malignant neoplasm of unspecified part of unspecified bronchus or lung: Secondary | ICD-10-CM

## 2021-12-26 NOTE — Telephone Encounter (Signed)
Referral made 

## 2022-01-02 ENCOUNTER — Telehealth: Payer: Self-pay | Admitting: Nurse Practitioner

## 2022-01-08 ENCOUNTER — Ambulatory Visit: Payer: Medicare HMO | Admitting: Cardiology

## 2022-01-14 ENCOUNTER — Ambulatory Visit: Payer: Medicare HMO | Admitting: Radiation Oncology

## 2022-01-16 ENCOUNTER — Ambulatory Visit: Payer: Self-pay | Admitting: Radiation Oncology

## 2022-01-17 DEATH — deceased

## 2022-03-25 ENCOUNTER — Ambulatory Visit: Payer: Medicare HMO | Admitting: Nurse Practitioner

## 2023-10-17 IMAGING — MG MM DIGITAL DIAGNOSTIC UNILAT*L* W/ TOMO W/ CAD
4 series · 4 of 12 positions shown · non-contrast
Comparison: Previous exams.

CLINICAL DATA: Screening recall for possible left breast
asymmetries.

EXAM:
DIGITAL DIAGNOSTIC UNILATERAL LEFT MAMMOGRAM WITH TOMOSYNTHESIS AND
CAD
TECHNIQUE: Left digital diagnostic mammography and breast tomosynthesis was
performed. The images were evaluated with computer-aided detection.

[L CC synth-2D]
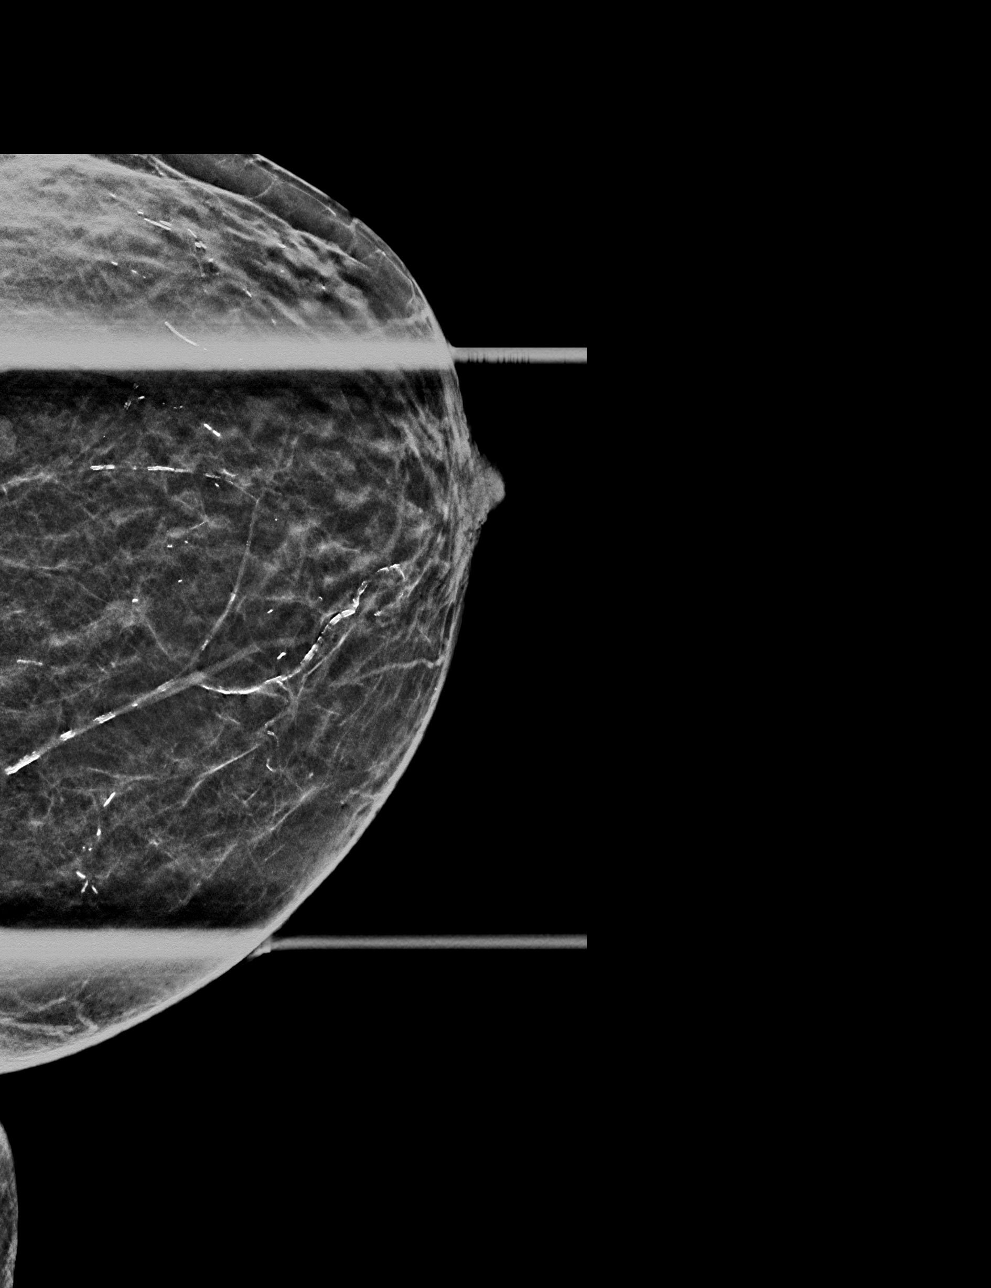

[L MLO synth-2D]
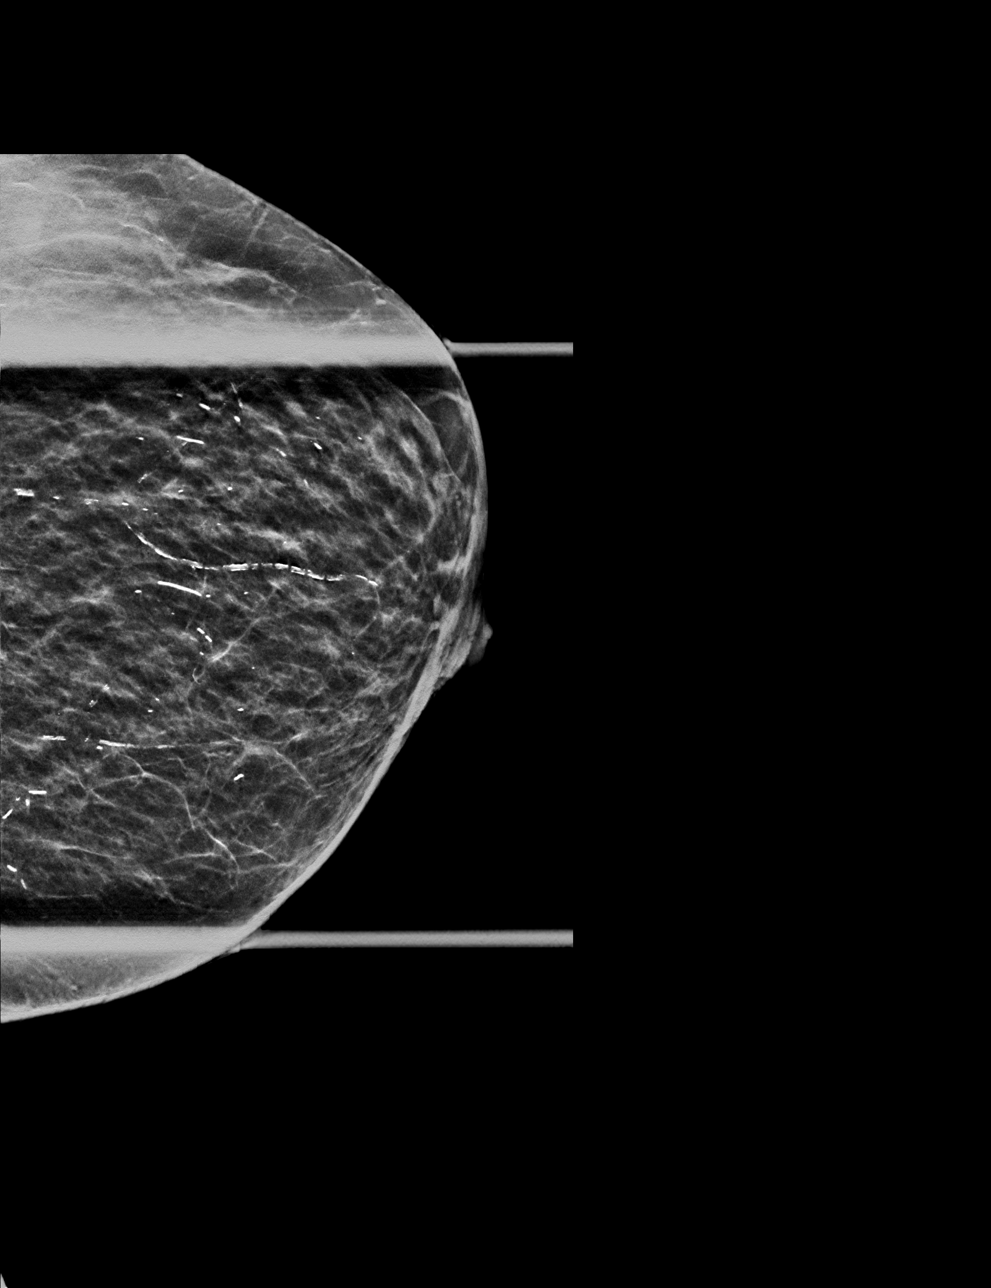

[L MLO tomo · tomo slice 19/37.0]
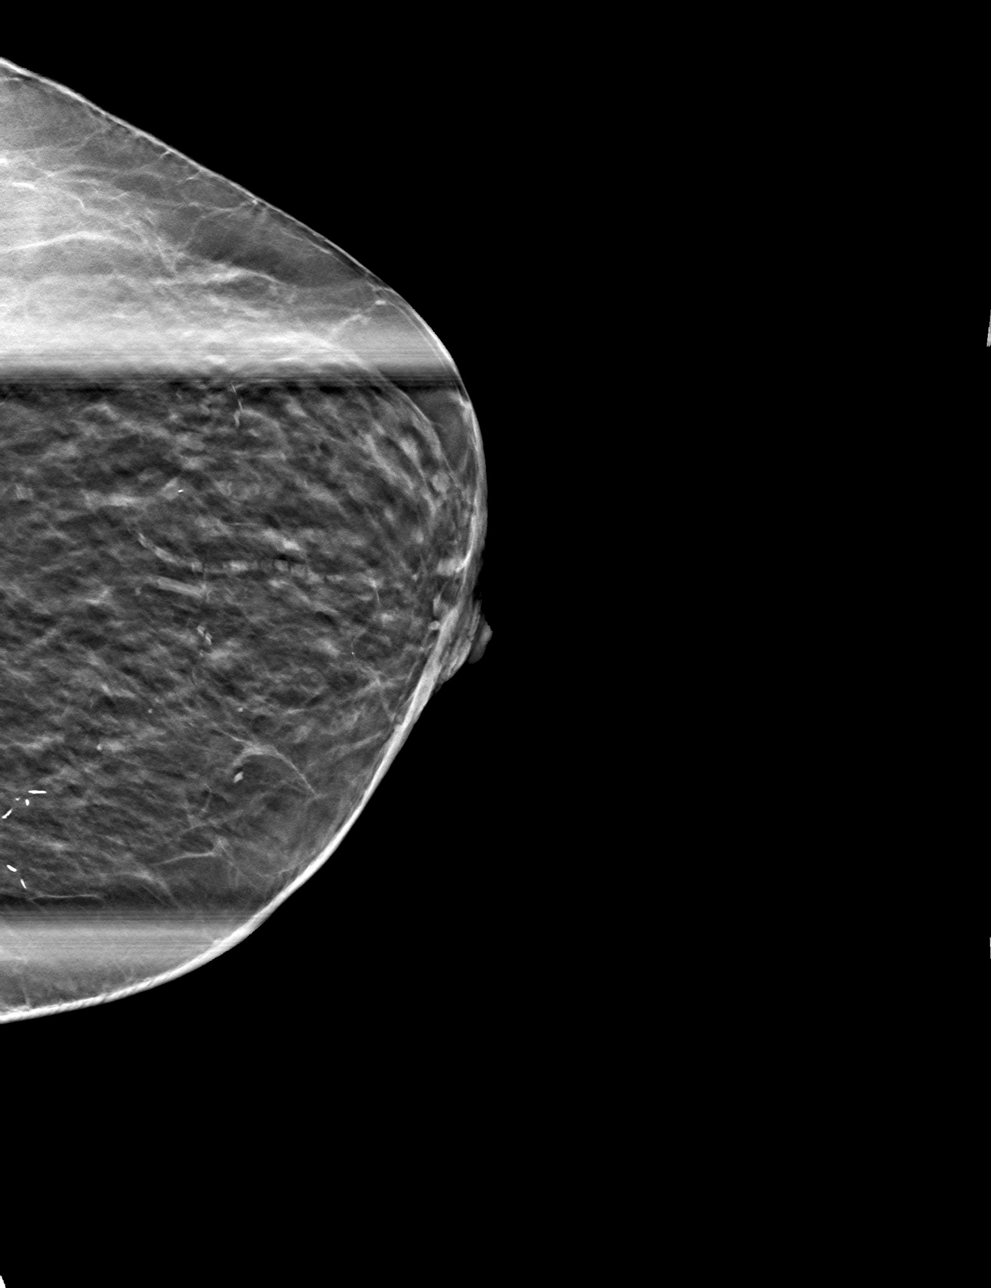

[L CC tomo · tomo slice 19/36.0]
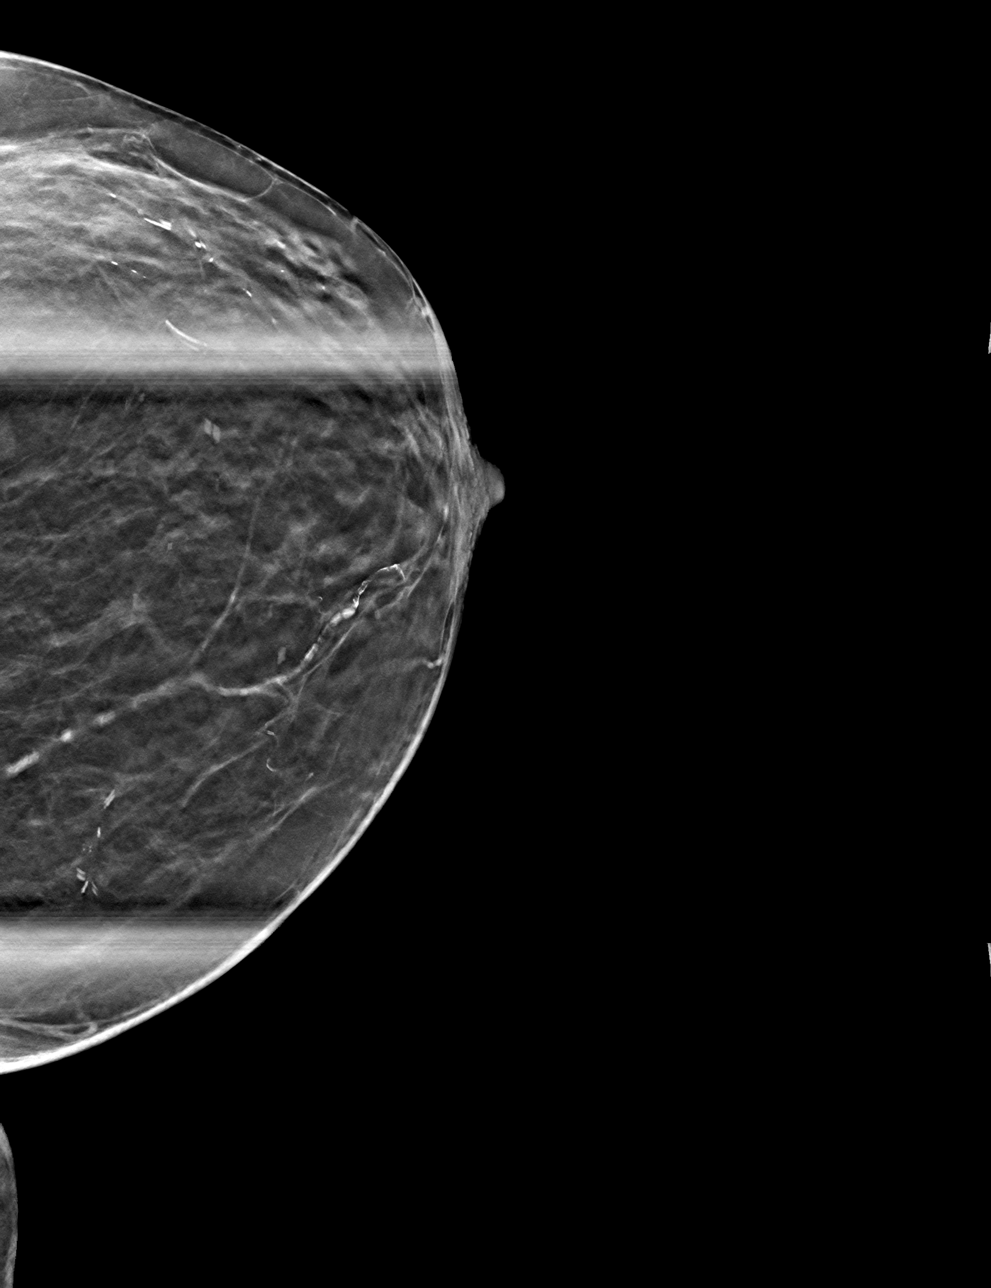

[4 of 12 positions shown; findings below may reference images not displayed]

ACR Breast Density Category c: The breast tissue is heterogeneously
dense, which may obscure small masses.
FINDINGS: Additional tomograms were performed of the left breast. The
initially questioned possible left breast asymmetries resolve on the
additional imaging with findings compatible with areas of
overlapping fibroglandular tissue. There is no mammographic evidence
of malignancy in the left breast.
IMPRESSION: No mammographic evidence of malignancy in the left breast.

RECOMMENDATION:
Screening mammogram in one year.(Code:K7-3-IJV)

I have discussed the findings and recommendations with the patient.
If applicable, a reminder letter will be sent to the patient
regarding the next appointment.

BI-RADS CATEGORY  1: Negative.
